# Patient Record
Sex: Female | Born: 1950
Health system: Southern US, Community
[De-identification: ages and names within clinical notes are randomized; demographics above are authoritative.]

## PROBLEM LIST (undated history)

## (undated) DIAGNOSIS — N289 Disorder of kidney and ureter, unspecified: Secondary | ICD-10-CM

## (undated) DIAGNOSIS — Z972 Presence of dental prosthetic device (complete) (partial): Secondary | ICD-10-CM

## (undated) DIAGNOSIS — Z860100 Personal history of colon polyps, unspecified: Secondary | ICD-10-CM

## (undated) DIAGNOSIS — N184 Chronic kidney disease, stage 4 (severe): Secondary | ICD-10-CM

## (undated) DIAGNOSIS — Z95828 Presence of other vascular implants and grafts: Secondary | ICD-10-CM

## (undated) DIAGNOSIS — Z923 Personal history of irradiation: Secondary | ICD-10-CM

## (undated) DIAGNOSIS — Z9221 Personal history of antineoplastic chemotherapy: Secondary | ICD-10-CM

## (undated) DIAGNOSIS — I82409 Acute embolism and thrombosis of unspecified deep veins of unspecified lower extremity: Secondary | ICD-10-CM

## (undated) DIAGNOSIS — A419 Sepsis, unspecified organism: Secondary | ICD-10-CM

## (undated) DIAGNOSIS — M7581 Other shoulder lesions, right shoulder: Secondary | ICD-10-CM

## (undated) DIAGNOSIS — D631 Anemia in chronic kidney disease: Secondary | ICD-10-CM

## (undated) DIAGNOSIS — IMO0002 Reserved for concepts with insufficient information to code with codable children: Secondary | ICD-10-CM

## (undated) DIAGNOSIS — I6521 Occlusion and stenosis of right carotid artery: Secondary | ICD-10-CM

## (undated) DIAGNOSIS — D0510 Intraductal carcinoma in situ of unspecified breast: Secondary | ICD-10-CM

## (undated) DIAGNOSIS — M810 Age-related osteoporosis without current pathological fracture: Secondary | ICD-10-CM

## (undated) DIAGNOSIS — E119 Type 2 diabetes mellitus without complications: Secondary | ICD-10-CM

## (undated) DIAGNOSIS — K435 Parastomal hernia without obstruction or  gangrene: Secondary | ICD-10-CM

## (undated) DIAGNOSIS — E1165 Type 2 diabetes mellitus with hyperglycemia: Secondary | ICD-10-CM

## (undated) DIAGNOSIS — E118 Type 2 diabetes mellitus with unspecified complications: Secondary | ICD-10-CM

## (undated) DIAGNOSIS — E785 Hyperlipidemia, unspecified: Secondary | ICD-10-CM

## (undated) DIAGNOSIS — C7910 Secondary malignant neoplasm of unspecified urinary organs: Secondary | ICD-10-CM

## (undated) DIAGNOSIS — I10 Essential (primary) hypertension: Secondary | ICD-10-CM

## (undated) DIAGNOSIS — C679 Malignant neoplasm of bladder, unspecified: Secondary | ICD-10-CM

## (undated) DIAGNOSIS — D509 Iron deficiency anemia, unspecified: Secondary | ICD-10-CM

## (undated) DIAGNOSIS — N189 Chronic kidney disease, unspecified: Secondary | ICD-10-CM

## (undated) HISTORY — DX: Occlusion and stenosis of right carotid artery: I65.21

## (undated) HISTORY — DX: Iron deficiency anemia, unspecified: D50.9

## (undated) HISTORY — DX: Type 2 diabetes mellitus with hyperglycemia: E11.65

## (undated) HISTORY — DX: Type 2 diabetes mellitus with unspecified complications: E11.8

## (undated) HISTORY — DX: Disorder of kidney and ureter, unspecified: N28.9

## (undated) HISTORY — DX: Age-related osteoporosis without current pathological fracture: M81.0

## (undated) HISTORY — DX: Personal history of colonic polyps: Z86.010

## (undated) HISTORY — DX: Type 2 diabetes mellitus without complications: E11.9

## (undated) HISTORY — DX: Essential (primary) hypertension: I10

## (undated) HISTORY — DX: Reserved for concepts with insufficient information to code with codable children: IMO0002

## (undated) HISTORY — DX: Other shoulder lesions, right shoulder: M75.81

## (undated) HISTORY — DX: Personal history of colon polyps, unspecified: Z86.0100

## (undated) HISTORY — DX: Hyperlipidemia, unspecified: E78.5

## (undated) HISTORY — DX: Chronic kidney disease, unspecified: N18.9

---
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
## 1998-06-04 HISTORY — PX: TOTAL ABDOMINAL HYSTERECTOMY: SHX209

## 1998-06-04 HISTORY — PX: ABDOMINAL HYSTERECTOMY: SHX81

## 2003-09-21 ENCOUNTER — Ambulatory Visit (HOSPITAL_COMMUNITY): Admission: RE | Admit: 2003-09-21 | Discharge: 2003-09-21 | Payer: Self-pay | Admitting: Orthopedic Surgery

## 2004-04-04 ENCOUNTER — Ambulatory Visit: Payer: Self-pay | Admitting: Surgery

## 2004-10-09 ENCOUNTER — Ambulatory Visit: Payer: Self-pay | Admitting: Surgery

## 2005-10-29 ENCOUNTER — Ambulatory Visit: Payer: Self-pay | Admitting: Family Medicine

## 2007-06-17 ENCOUNTER — Ambulatory Visit: Payer: Self-pay | Admitting: Family Medicine

## 2012-09-02 ENCOUNTER — Ambulatory Visit: Payer: Self-pay | Admitting: Physician Assistant

## 2013-02-23 ENCOUNTER — Encounter: Payer: Self-pay | Admitting: Physical Medicine & Rehabilitation

## 2013-03-17 HISTORY — PX: KNEE SURGERY: SHX244

## 2013-03-20 ENCOUNTER — Encounter: Payer: Worker's Compensation | Attending: Physical Medicine & Rehabilitation

## 2013-03-20 ENCOUNTER — Encounter: Payer: Self-pay | Admitting: Physical Medicine & Rehabilitation

## 2013-03-20 ENCOUNTER — Ambulatory Visit (HOSPITAL_BASED_OUTPATIENT_CLINIC_OR_DEPARTMENT_OTHER): Payer: Worker's Compensation | Admitting: Physical Medicine & Rehabilitation

## 2013-03-20 VITALS — BP 158/84 | HR 99 | Resp 14 | Ht 63.0 in | Wt 215.0 lb

## 2013-03-20 DIAGNOSIS — Z5181 Encounter for therapeutic drug level monitoring: Secondary | ICD-10-CM

## 2013-03-20 DIAGNOSIS — M5380 Other specified dorsopathies, site unspecified: Secondary | ICD-10-CM | POA: Insufficient documentation

## 2013-03-20 DIAGNOSIS — M545 Low back pain, unspecified: Secondary | ICD-10-CM | POA: Insufficient documentation

## 2013-03-20 DIAGNOSIS — E785 Hyperlipidemia, unspecified: Secondary | ICD-10-CM | POA: Insufficient documentation

## 2013-03-20 DIAGNOSIS — M4310 Spondylolisthesis, site unspecified: Secondary | ICD-10-CM

## 2013-03-20 DIAGNOSIS — M4316 Spondylolisthesis, lumbar region: Secondary | ICD-10-CM | POA: Insufficient documentation

## 2013-03-20 DIAGNOSIS — I10 Essential (primary) hypertension: Secondary | ICD-10-CM | POA: Insufficient documentation

## 2013-03-20 DIAGNOSIS — M47812 Spondylosis without myelopathy or radiculopathy, cervical region: Secondary | ICD-10-CM

## 2013-03-20 DIAGNOSIS — M549 Dorsalgia, unspecified: Secondary | ICD-10-CM

## 2013-03-20 DIAGNOSIS — Q762 Congenital spondylolisthesis: Secondary | ICD-10-CM | POA: Insufficient documentation

## 2013-03-20 DIAGNOSIS — M542 Cervicalgia: Secondary | ICD-10-CM | POA: Insufficient documentation

## 2013-03-20 DIAGNOSIS — Z79899 Other long term (current) drug therapy: Secondary | ICD-10-CM

## 2013-03-20 DIAGNOSIS — E119 Type 2 diabetes mellitus without complications: Secondary | ICD-10-CM | POA: Insufficient documentation

## 2013-03-20 NOTE — Progress Notes (Addendum)
Subjective:    Patient ID: Angela Russell, female    DOB: [birth year], 62 y.o.   MRN: [id number] DOI 08/27/2012  Primary complaint is neck pain with movement. 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 Golden Circle forwards on to the left knee. Injured left knee right shoulder neck and low back. Initial visit was the following day to a walk-in clinic in [place]. Then went to [facility] for another evaluation. X-rays were performed. Patient was then referred to orthopedic surgeon Dr. Hart Robinsons. Also referred to Dr. Louanne Skye orthopedic spine surgeon. I have reviewed the records from Dr. Louanne Skye. I do not have records from Dr. Theda Sers. Referral comes from orthopedic spine surgery and evaluation will be focused on cervical thoracic and lumbar spine pain.  Report from Dr. Louanne Skye indicates some numbness and tingling on the right side on his initial evaluation 11/14/2012. X-rays during that visit revealed slight anterolisthesis C3-C4 anterior spurs C4-C5 C5-C6 C6-C7 and C7-T1 narrowing of the disc spaces at C4-C5 and C5-C6. 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 Mainly from T4 to T. 11. Spondylosis noted at L4-L5 L5-S1 SI joint slipped okay. Grade 1 to grade 2 spondylolisthesis of L5-S1 levels MRI was ordered. Diagnosis of exacerbation of cervical spondylosis as well as exacerbation of spondylolisthesis MRI of the cervical spine showed central disc protrusions C3-C4 moderate stenosis 9 mm C4-C5 disc osteophyte complex 10 mm canal. C5-C6 central disc protrusion 8 mm canal. MRI of the shoulder ordered by Dr. Theda Sers showed a right rotator cuff supraspinatus tendon tear. Patient was treated with naproxen and Flexeril. EMG studies were recommended.  Goes to physical therapy on Wednesdays and Fridays. Physical therapy is addressing left knee pain, right shoulder pain, neck pain. Also working somewhat with low back pain  although this symptom is generally improving  Postoperatively after her knee arthroscopy 03/17/2013 tried some hydrocodone but could not tolerate this medicine. Also not taking any over-the-counter medications. No longer taking naproxen or Flexeril  Has not been back to work since injury. 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 Theda Sers on 03/17/2013, "torn meniscus" Pain Inventory Average Pain 7 Pain Right Now 8 My pain is dull  In the last 24 hours, has pain interfered with the following? General activity 8 Relation with others 9 Enjoyment of life 9 What TIME of day is your pain at its worst? night Sleep (in general) Fair  Pain is worse with: unsure Pain improves with: therapy/exercise and TENS Relief from Meds: 7  Mobility walk without assistance ability to climb steps?  no do you drive?  no needs help with transfers Do you have any goals in this area?  no  Function employed # of hrs/week . 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  no  Physicians involved in your care Any changes since last visit?  no   Family History  Problem Relation Age of Onset  . Heart disease Mother   . Depression Sister    History   Social History  . Marital Status: Legally Separated    Spouse Name: N/A    Number of Children: N/A  . Years of Education: N/A  Social History Main Topics  . Smoking status: Former Research scientist (life sciences)  . Smokeless tobacco: Never Used  . Alcohol Use: No  . Drug Use: No  . Sexual Activity: None   Other Topics Concern  . None   Social History Narrative  . None   Past Surgical History  Procedure Laterality Date  . Abdominal hysterectomy    . Knee surgery Left 03-17-13   Past  Medical History  Diagnosis Date  . Hyperlipidemia   . Hypertension   . Diabetes mellitus without complication    BP A999333  Pulse 99  Resp 14  Ht 5\' 3"  (1.6 m)  Wt 215 lb (97.523 kg)  BMI 38.09 kg/m2  SpO2 97%      Review of Systems  Musculoskeletal: Positive for back pain, gait problem and neck pain.  All other systems reviewed and are negative.       Objective:   Physical Exam  Nursing note and vitals reviewed. Constitutional: She is oriented to person, place, and time. She appears well-developed and well-nourished.  HENT:  Head: Normocephalic and atraumatic.  Right Ear: External ear normal.  Left Ear: External ear normal.  Eyes: Conjunctivae and EOM are normal. Pupils are equal, round, and reactive to light.  Neck: Normal range of motion.  Musculoskeletal:       Cervical back: She exhibits tenderness, bony tenderness and pain. She exhibits no swelling and no deformity.       Thoracic back: Normal.       Lumbar back: Normal.  Tenderness along the lateral masses of the right C4, C5, and C6 areas  Neurological: She is alert and oriented to person, place, and time. She has normal strength and normal reflexes. No sensory deficit. Gait abnormal. Coordination normal.  Short step gait no assist device.  Psychiatric: She has a normal mood and affect.          Assessment & Plan:  1. Cervical facet syndrome appears most symptomatic around right mid cervical area. No evidence of radiculopathy. I think she may benefit from right cervical medial branch block C4 C5-C6 under fluoroscopic guidance lidocaine only given diabetes. Patient really does not want to try any other oral medications.  Continue outpatient physical therapy.  Sedentary work restrictions no driving. Will defer to Dr. Theda Sers in terms of what he feels the patient is ready to drive a clutch. I don't think her neck problems will interfere with using side mirrors  2. Lumbar spondylolisthesis mild symptoms at  this time. Continue physical therapy. No injections recommended  Care of the patient discussed with case manager Whitman Hero RN with the patient present. Fax [phone]

## 2013-03-20 NOTE — Patient Instructions (Addendum)
Recommend sedentary light duty no driving Continue outpatient physical therapy Dr. Theda Sers from orthopedics will recommend when you may use the left knee to shift  Medial branch blocks can be done either here or at Dr. Otho Ket office

## 2013-04-23 ENCOUNTER — Ambulatory Visit (HOSPITAL_BASED_OUTPATIENT_CLINIC_OR_DEPARTMENT_OTHER): Payer: Worker's Compensation | Admitting: Physical Medicine & Rehabilitation

## 2013-04-23 ENCOUNTER — Encounter: Payer: Self-pay | Admitting: Physical Medicine & Rehabilitation

## 2013-04-23 ENCOUNTER — Encounter: Payer: Worker's Compensation | Attending: Physical Medicine & Rehabilitation

## 2013-04-23 ENCOUNTER — Telehealth: Payer: Self-pay

## 2013-04-23 VITALS — BP 170/83 | HR 110 | Resp 14 | Ht 63.5 in | Wt 219.8 lb

## 2013-04-23 DIAGNOSIS — M545 Low back pain, unspecified: Secondary | ICD-10-CM | POA: Insufficient documentation

## 2013-04-23 DIAGNOSIS — M5380 Other specified dorsopathies, site unspecified: Secondary | ICD-10-CM

## 2013-04-23 DIAGNOSIS — I10 Essential (primary) hypertension: Secondary | ICD-10-CM | POA: Insufficient documentation

## 2013-04-23 DIAGNOSIS — M542 Cervicalgia: Secondary | ICD-10-CM | POA: Insufficient documentation

## 2013-04-23 DIAGNOSIS — Q762 Congenital spondylolisthesis: Secondary | ICD-10-CM | POA: Insufficient documentation

## 2013-04-23 DIAGNOSIS — M47812 Spondylosis without myelopathy or radiculopathy, cervical region: Secondary | ICD-10-CM

## 2013-04-23 DIAGNOSIS — E119 Type 2 diabetes mellitus without complications: Secondary | ICD-10-CM | POA: Insufficient documentation

## 2013-04-23 DIAGNOSIS — E785 Hyperlipidemia, unspecified: Secondary | ICD-10-CM | POA: Insufficient documentation

## 2013-04-23 NOTE — Patient Instructions (Addendum)
Cervical medial branch blocks done. C4 C5-C6 levels   This is to help diagnose the cause of neck pain. It is important that you keep track of your pain for the first day or 2 after injection. This injection can give you temporary relief that lasts for hours or up to several months. There is no way to predict duration of pain relief.  Please try to compare your pain after injection to for the injection.  If this injection gives you  temporary relief there may be another longer-lasting procedure that may be beneficial call radiofrequency ablation

## 2013-04-23 NOTE — Telephone Encounter (Signed)
Patient called wondering when she can go back to therapy after injection.  Advise patient she can go to therapy but needs to advise therapist.

## 2013-04-23 NOTE — Progress Notes (Signed)
  [facility]   Name: Angela Russell DOB:29-Jan-1951 MRN: [id number]  Date:04/23/2013  Physician: Alysia Penna, MD    Nurse/CMA: Claiborne Rigg RN/[place] CMA  Allergies: No Known Allergies  Consent Signed: yes  Is patient diabetic? yes  CBG today?   Pregnant: no LMP: No LMP recorded. Patient has had a hysterectomy. (age [age])  Anticoagulants: no Anti-inflammatory: no Antibiotics: no  Procedure: Right Cervical 4-5-6 Medial Branch Block Position: Left Lateral Start Time:1:16  End Time: 1:25 Fluoro Time: 23 seconds  RN/CMA Walston CMA Shumaker RN    Time 12:52 1:29    BP 170/83 151/67    Pulse 110 105    Respirations 14 14    O2 Sat 96 100    S/S 6 6    Pain Level 7/10 0/10     D/C home with Kathlene November, patient A & O X 3, D/C instructions reviewed, and sits independently.

## 2013-04-23 NOTE — Progress Notes (Signed)
Right C4 C5-C6 medial branch blocks under fluoroscopic guidance  Indication right cervical facet syndrome with pain that does not respond well to conservative care and is rated as moderate to severe  Informed consent was obtained after describing risks and benefits of the procedure with the patient is include bleeding bruising and infection she elects to proceed and has given written consent patient placed in a left lateral decubitus position fluoroscopic images obtained. Areas of marked and prepped with Betadine. A 27-gauge 1/2 inch needle was used to anesthetize the skin and subcutaneous tissue with 1% lidocaine 0.5 mL at 3 areas. Then a 25-gauge 1.5 inch needle was inserted under fluoroscopic guidance targeting the midpoint of the articular pillar of C4. Needle advanced under fluoroscopic guidance to bone contact. AP images confirms proper needle location. Then 0.5 mL of 1% lidocaine was injected. This same procedure was repeated at separate needle insertion points at C5 and at C6. Patient tolerated procedure well. Post procedure instructions given. 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 No driving a less cleared by orthopedic surgeon.

## 2013-05-08 ENCOUNTER — Encounter: Payer: Worker's Compensation | Attending: Physical Medicine & Rehabilitation

## 2013-05-08 ENCOUNTER — Ambulatory Visit (HOSPITAL_BASED_OUTPATIENT_CLINIC_OR_DEPARTMENT_OTHER): Payer: Worker's Compensation | Admitting: Physical Medicine & Rehabilitation

## 2013-05-08 ENCOUNTER — Encounter: Payer: Self-pay | Admitting: Physical Medicine & Rehabilitation

## 2013-05-08 VITALS — BP 152/60 | HR 112 | Resp 14 | Ht 63.5 in | Wt 219.0 lb

## 2013-05-08 DIAGNOSIS — M545 Low back pain, unspecified: Secondary | ICD-10-CM | POA: Insufficient documentation

## 2013-05-08 DIAGNOSIS — M47812 Spondylosis without myelopathy or radiculopathy, cervical region: Secondary | ICD-10-CM

## 2013-05-08 DIAGNOSIS — I10 Essential (primary) hypertension: Secondary | ICD-10-CM | POA: Insufficient documentation

## 2013-05-08 DIAGNOSIS — E785 Hyperlipidemia, unspecified: Secondary | ICD-10-CM | POA: Insufficient documentation

## 2013-05-08 DIAGNOSIS — M5380 Other specified dorsopathies, site unspecified: Secondary | ICD-10-CM

## 2013-05-08 DIAGNOSIS — M542 Cervicalgia: Secondary | ICD-10-CM | POA: Insufficient documentation

## 2013-05-08 DIAGNOSIS — E119 Type 2 diabetes mellitus without complications: Secondary | ICD-10-CM | POA: Insufficient documentation

## 2013-05-08 NOTE — Progress Notes (Signed)
Subjective:    Patient ID: Angela Russell, female    DOB: [birth year], 62 y.o.   MRN: [id number] 08/27/2012 HPI  Primary complaint is neck pain with movement. 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 Golden Circle forwards on to the left knee. Injured left knee right shoulder neck and low back. Initial visit was the following day to a walk-in clinic in [place]. Then went to [facility] for another evaluation. X-rays were performed. Patient was then referred to orthopedic surgeon Dr. Hart Robinsons. Also referred to Dr. Louanne Skye orthopedic spine surgeon. I have reviewed the records from Dr. Louanne Skye. I do not have records from Dr. Theda Sers. Referral comes from orthopedic spine surgery and evaluation will be focused on cervical thoracic and lumbar spine pain.  Report from Dr. Louanne Skye indicates some numbness and tingling on the right side on his initial evaluation 11/14/2012. X-rays during that visit revealed slight anterolisthesis C3-C4 anterior spurs C4-C5 C5-C6 C6-C7 and C7-T1 narrowing of the disc spaces at C4-C5 and C5-C6. 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 Mainly from T4 to T. 11. Spondylosis noted at L4-L5 L5-S1 SI joint slipped okay. Grade 1 to grade 2 spondylolisthesis of L5-S1 levels MRI was ordered. Diagnosis of exacerbation of cervical spondylosis as well as exacerbation of spondylolisthesis MRI of the cervical spine showed central disc protrusions C3-C4 moderate stenosis 9 mm C4-C5 disc osteophyte complex 10 mm canal. C5-C6 central disc protrusion 8 mm canal. MRI of the shoulder ordered by Dr. Theda Sers showed a right rotator cuff supraspinatus tendon tear. Patient was treated with naproxen and Flexeril. EMG studies were recommended.  Goes to physical therapy on Wednesdays and Fridays. Physical therapy is addressing left knee pain, right shoulder pain, neck pain. Also working somewhat with low back pain  although this symptom is generally improving  Postoperatively after her knee arthroscopy 03/17/2013 tried some hydrocodone but could not tolerate this medicine. Also not taking any over-the-counter medications. No longer taking naproxen or Flexeril  Has not been back to work since injury. 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 General activity 4 Relation with others 0 Enjoyment of life 5 What TIME of day is your pain at its worst? daytime Sleep (in general) Good  Pain is worse with: bending and some activites Pain improves with: rest and medication Relief from Meds: 6  Mobility walk without assistance how many minutes can you walk? 15 ability to climb steps?  no do you drive?  yes  Function not employed: date last employed na  Neuro/Psych No problems in this area  Prior Studies Any changes since last visit?  no  Physicians involved in your care Any changes since last visit?  no   Family History  Problem Relation Age of Onset  . Heart disease Mother   . Depression Sister    History   Social History  . Marital Status: Legally Separated    Spouse Name: N/A    Number of Children: N/A  . Years of Education: N/A   Social History Main Topics  .  Smoking status: Former Research scientist (life sciences)  . Smokeless tobacco: Never Used  . Alcohol Use: No  . Drug Use: No  . Sexual Activity: None   Other Topics Concern  . None   Social History Narrative  . None   Past Surgical History  Procedure Laterality Date  . Abdominal hysterectomy    . Knee surgery Left 03-17-13   Past Medical History  Diagnosis Date  .  Hyperlipidemia   . Hypertension   . Diabetes mellitus without complication    BP A999333  Pulse 112  Resp 14  Ht 5' 3.5" (1.613 m)  Wt 219 lb (99.338 kg)  BMI 38.18 kg/m2  SpO2 98%     Review of Systems  Musculoskeletal: Positive for back pain.  All other systems reviewed and are negative.       Objective:   Physical Exam  Neck with reduced rotation toward the right side about 50% %, to left 75% Neck flexion and extension are full Upper extremity strength is normal Deep tendon reflexes bilateral biceps triceps brachioradialis are normal Reduced right shoulder internal/external rotation. R C5 lateral neck tenderness      Assessment & Plan:  1. Cervical facet syndrome, C4-5, C5-6 No signs of radiculopathy Good response to C4-5-6 injection Continue PT.  If pain increases to a 5 or greater would recommend repeat C4 C5-C6 medial branch blocks Also discussed that radio frequency ablation may be helpful if only short term pain relief is achieved through medial branch blocks and physical therapy  Return to clinic 6 weeks  From a neck standpoint I  feel patient can perform light duty however has knee and R shoulder limitations that are being treated by orthopedics

## 2013-05-08 NOTE — Patient Instructions (Signed)
Cervical facet syndrome, C4-5, C5-6 No signs of radiculopathy Good response to C4-5-6 injection Continue PT.  If pain increases to a 5 or greater would recommend repeat C4 C5-C6 medial branch blocks Also discussed that radio frequency ablation may be helpful if only short term pain relief is achieved through medial branch blocks and physical therapy  Return to clinic 6 weeks  From a neck standpoint I  feel patient can perform light duty however has knee and R shoulder limitations that are being treated by orthopedics

## 2013-06-01 ENCOUNTER — Ambulatory Visit: Payer: Worker's Compensation | Admitting: Physical Medicine & Rehabilitation

## 2013-06-19 ENCOUNTER — Encounter: Payer: Self-pay | Admitting: Physical Medicine & Rehabilitation

## 2013-06-19 ENCOUNTER — Encounter: Payer: Worker's Compensation | Attending: Physical Medicine & Rehabilitation

## 2013-06-19 ENCOUNTER — Ambulatory Visit (HOSPITAL_BASED_OUTPATIENT_CLINIC_OR_DEPARTMENT_OTHER): Payer: Worker's Compensation | Admitting: Physical Medicine & Rehabilitation

## 2013-06-19 VITALS — BP 159/80 | HR 114 | Resp 14 | Ht 63.0 in | Wt 225.0 lb

## 2013-06-19 DIAGNOSIS — M531 Cervicobrachial syndrome: Secondary | ICD-10-CM

## 2013-06-19 DIAGNOSIS — E119 Type 2 diabetes mellitus without complications: Secondary | ICD-10-CM | POA: Insufficient documentation

## 2013-06-19 DIAGNOSIS — M542 Cervicalgia: Secondary | ICD-10-CM | POA: Insufficient documentation

## 2013-06-19 DIAGNOSIS — M47812 Spondylosis without myelopathy or radiculopathy, cervical region: Secondary | ICD-10-CM

## 2013-06-19 DIAGNOSIS — M545 Low back pain, unspecified: Secondary | ICD-10-CM | POA: Insufficient documentation

## 2013-06-19 DIAGNOSIS — M5380 Other specified dorsopathies, site unspecified: Secondary | ICD-10-CM | POA: Insufficient documentation

## 2013-06-19 DIAGNOSIS — I10 Essential (primary) hypertension: Secondary | ICD-10-CM | POA: Insufficient documentation

## 2013-06-19 DIAGNOSIS — E785 Hyperlipidemia, unspecified: Secondary | ICD-10-CM | POA: Insufficient documentation

## 2013-06-19 DIAGNOSIS — Q762 Congenital spondylolisthesis: Secondary | ICD-10-CM | POA: Insufficient documentation

## 2013-06-19 NOTE — Patient Instructions (Signed)
Work restrictions per Dr. Louanne Skye  No further injections needed at this time.  Not be prescribing any pain medications.  Therapy as well as establishment of an MI per Dr. Louanne Skye

## 2013-06-19 NOTE — Progress Notes (Signed)
Subjective:    Patient ID: Angela Russell, female    DOB: [birth year], 63 y.o.   MRN: [id number]  HPI Neck is feeling better, no neck pain unless turning head all the way to the right Still doing physical therapy on neck knee as well as shoulder, still going twice a week, has been going to physical therapy since May of 2014   Still orthopedist Dr. Theda Sers for shoulder pain on the right side as well as left knee pain. Left knee pain is much better Takes occasional naproxen for pain Pain Inventory Average Pain 6 Pain Right Now 4 My pain is dull  In the last 24 hours, has pain interfered with the following? General activity 7 Relation with others 4 Enjoyment of life 7 What TIME of day is your pain at its worst? daytime Sleep (in general) Fair  Pain is worse with: walking and some activites Pain improves with: therapy/exercise and injections Relief from Meds: 4  Mobility walk without assistance how many minutes can you walk? 20 do you drive?  yes transfers alone Do you have any goals in this area?  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  yes  Neuro/Psych No problems in this area  Prior Studies Any changes since last visit?  no  Physicians involved in your care Any changes since last visit?  yes   Family History  Problem Relation Age of Onset  . Heart disease Mother   . Depression Sister    History   Social History  . Marital Status: Legally Separated    Spouse Name: N/A    Number of Children: N/A  . Years of Education: N/A   Social History Main Topics  . Smoking status: Former Research scientist (life sciences)  . Smokeless tobacco: Never Used  . Alcohol Use: No  . Drug Use: No  . Sexual Activity: None   Other Topics Concern  . None   Social History Narrative  . None   Past Surgical History  Procedure Laterality Date  . Abdominal hysterectomy    . Knee surgery Left 03-17-13    Past Medical History  Diagnosis Date  . Hyperlipidemia   . Hypertension   . Diabetes mellitus without complication    BP 99991111  Pulse 114  Resp 14  Ht 5\' 3"  (1.6 m)  Wt 225 lb (102.059 kg)  BMI 39.87 kg/m2  SpO2 97%      Review of Systems  Endocrine:       High blood sugar  Musculoskeletal: Positive for back pain and neck pain.  All other systems reviewed and are negative.       Objective:   Physical Exam  Neck range of motion has improved turning to the right now is 75% of normal turning to left is 75-90% of normal  No pain with flexion or extension of the cervical spine, no flexion or extension limitations  Upper extremity strength is normal  Ambulation is normal  Mood and affect are appropriate    Assessment & Plan:  1. right  cervical facet joints syndrome which has improved after Right C4 C5-C6 medial branch blocks under fluoroscopic guidance on 04/23/2013. No evidence of recurrence of more severe pain. At this point I do not think any further interventional procedures are necessary.  She'll followup with spine surgeon as well as orthopedic surgeon.  I will see her on a when necessary basis should  her neck pain recur to repeat the above procedure  Care of the patient discussed with case manager Whitman Hero RN with the patient present. Fax [phone]

## 2013-11-05 DIAGNOSIS — I708 Atherosclerosis of other arteries: Secondary | ICD-10-CM | POA: Insufficient documentation

## 2014-01-02 DIAGNOSIS — I6521 Occlusion and stenosis of right carotid artery: Secondary | ICD-10-CM

## 2014-01-02 HISTORY — DX: Occlusion and stenosis of right carotid artery: I65.21

## 2016-01-27 DIAGNOSIS — E1165 Type 2 diabetes mellitus with hyperglycemia: Secondary | ICD-10-CM | POA: Diagnosis not present

## 2016-03-08 DIAGNOSIS — E1165 Type 2 diabetes mellitus with hyperglycemia: Secondary | ICD-10-CM | POA: Diagnosis not present

## 2016-03-08 DIAGNOSIS — E782 Mixed hyperlipidemia: Secondary | ICD-10-CM | POA: Diagnosis not present

## 2016-03-08 DIAGNOSIS — N390 Urinary tract infection, site not specified: Secondary | ICD-10-CM | POA: Diagnosis not present

## 2016-03-08 DIAGNOSIS — I10 Essential (primary) hypertension: Secondary | ICD-10-CM | POA: Diagnosis not present

## 2016-03-08 DIAGNOSIS — N289 Disorder of kidney and ureter, unspecified: Secondary | ICD-10-CM | POA: Diagnosis not present

## 2016-03-09 DIAGNOSIS — N390 Urinary tract infection, site not specified: Secondary | ICD-10-CM | POA: Diagnosis not present

## 2016-03-11 DIAGNOSIS — E1165 Type 2 diabetes mellitus with hyperglycemia: Secondary | ICD-10-CM

## 2016-05-24 DIAGNOSIS — M542 Cervicalgia: Secondary | ICD-10-CM | POA: Diagnosis not present

## 2016-05-24 DIAGNOSIS — Z79899 Other long term (current) drug therapy: Secondary | ICD-10-CM | POA: Diagnosis not present

## 2016-05-24 DIAGNOSIS — Z7984 Long term (current) use of oral hypoglycemic drugs: Secondary | ICD-10-CM | POA: Diagnosis not present

## 2016-05-24 DIAGNOSIS — I10 Essential (primary) hypertension: Secondary | ICD-10-CM | POA: Diagnosis not present

## 2016-05-24 DIAGNOSIS — E119 Type 2 diabetes mellitus without complications: Secondary | ICD-10-CM | POA: Diagnosis not present

## 2016-05-24 DIAGNOSIS — H9201 Otalgia, right ear: Secondary | ICD-10-CM | POA: Diagnosis not present

## 2016-07-16 DIAGNOSIS — E1165 Type 2 diabetes mellitus with hyperglycemia: Secondary | ICD-10-CM | POA: Diagnosis not present

## 2016-07-16 DIAGNOSIS — I10 Essential (primary) hypertension: Secondary | ICD-10-CM | POA: Diagnosis not present

## 2016-07-16 LAB — MICROALBUMIN, URINE

## 2016-07-23 DIAGNOSIS — I10 Essential (primary) hypertension: Secondary | ICD-10-CM | POA: Diagnosis not present

## 2016-07-23 DIAGNOSIS — D649 Anemia, unspecified: Secondary | ICD-10-CM | POA: Diagnosis not present

## 2016-07-23 DIAGNOSIS — E1165 Type 2 diabetes mellitus with hyperglycemia: Secondary | ICD-10-CM | POA: Diagnosis not present

## 2016-07-23 DIAGNOSIS — E782 Mixed hyperlipidemia: Secondary | ICD-10-CM | POA: Diagnosis not present

## 2016-07-24 DIAGNOSIS — D649 Anemia, unspecified: Secondary | ICD-10-CM | POA: Insufficient documentation

## 2016-07-26 DIAGNOSIS — M47812 Spondylosis without myelopathy or radiculopathy, cervical region: Secondary | ICD-10-CM | POA: Diagnosis not present

## 2016-09-08 DIAGNOSIS — Z5181 Encounter for therapeutic drug level monitoring: Secondary | ICD-10-CM | POA: Diagnosis not present

## 2016-09-08 DIAGNOSIS — H65191 Other acute nonsuppurative otitis media, right ear: Secondary | ICD-10-CM | POA: Diagnosis not present

## 2016-09-08 DIAGNOSIS — Z79899 Other long term (current) drug therapy: Secondary | ICD-10-CM | POA: Diagnosis not present

## 2016-09-08 DIAGNOSIS — E162 Hypoglycemia, unspecified: Secondary | ICD-10-CM | POA: Diagnosis not present

## 2016-09-08 DIAGNOSIS — E11649 Type 2 diabetes mellitus with hypoglycemia without coma: Secondary | ICD-10-CM | POA: Diagnosis not present

## 2016-09-08 DIAGNOSIS — H9201 Otalgia, right ear: Secondary | ICD-10-CM | POA: Diagnosis not present

## 2016-09-08 DIAGNOSIS — E161 Other hypoglycemia: Secondary | ICD-10-CM | POA: Diagnosis not present

## 2016-09-08 DIAGNOSIS — R5383 Other fatigue: Secondary | ICD-10-CM | POA: Diagnosis not present

## 2016-09-08 DIAGNOSIS — Z7984 Long term (current) use of oral hypoglycemic drugs: Secondary | ICD-10-CM | POA: Diagnosis not present

## 2016-09-08 DIAGNOSIS — E1165 Type 2 diabetes mellitus with hyperglycemia: Secondary | ICD-10-CM | POA: Diagnosis not present

## 2016-09-09 DIAGNOSIS — E1165 Type 2 diabetes mellitus with hyperglycemia: Secondary | ICD-10-CM | POA: Diagnosis not present

## 2016-09-09 DIAGNOSIS — H9201 Otalgia, right ear: Secondary | ICD-10-CM | POA: Diagnosis not present

## 2016-09-09 DIAGNOSIS — E11649 Type 2 diabetes mellitus with hypoglycemia without coma: Secondary | ICD-10-CM | POA: Diagnosis not present

## 2016-10-01 DIAGNOSIS — H5203 Hypermetropia, bilateral: Secondary | ICD-10-CM | POA: Diagnosis not present

## 2016-10-01 DIAGNOSIS — H52223 Regular astigmatism, bilateral: Secondary | ICD-10-CM | POA: Diagnosis not present

## 2016-10-01 DIAGNOSIS — H2513 Age-related nuclear cataract, bilateral: Secondary | ICD-10-CM | POA: Diagnosis not present

## 2016-10-01 DIAGNOSIS — I10 Essential (primary) hypertension: Secondary | ICD-10-CM | POA: Diagnosis not present

## 2016-10-01 DIAGNOSIS — H524 Presbyopia: Secondary | ICD-10-CM | POA: Diagnosis not present

## 2016-10-01 DIAGNOSIS — E119 Type 2 diabetes mellitus without complications: Secondary | ICD-10-CM | POA: Diagnosis not present

## 2016-10-01 LAB — HM DIABETES EYE EXAM

## 2016-10-15 DIAGNOSIS — D649 Anemia, unspecified: Secondary | ICD-10-CM | POA: Diagnosis not present

## 2016-10-15 DIAGNOSIS — E782 Mixed hyperlipidemia: Secondary | ICD-10-CM | POA: Diagnosis not present

## 2016-10-15 DIAGNOSIS — E1165 Type 2 diabetes mellitus with hyperglycemia: Secondary | ICD-10-CM | POA: Diagnosis not present

## 2016-10-15 LAB — HEMOGLOBIN A1C

## 2016-10-16 DIAGNOSIS — I10 Essential (primary) hypertension: Secondary | ICD-10-CM | POA: Insufficient documentation

## 2016-10-16 DIAGNOSIS — E1159 Type 2 diabetes mellitus with other circulatory complications: Secondary | ICD-10-CM | POA: Insufficient documentation

## 2016-10-16 DIAGNOSIS — N189 Chronic kidney disease, unspecified: Secondary | ICD-10-CM | POA: Insufficient documentation

## 2016-10-16 DIAGNOSIS — E118 Type 2 diabetes mellitus with unspecified complications: Secondary | ICD-10-CM | POA: Insufficient documentation

## 2016-10-16 DIAGNOSIS — E782 Mixed hyperlipidemia: Secondary | ICD-10-CM

## 2016-10-16 DIAGNOSIS — I152 Hypertension secondary to endocrine disorders: Secondary | ICD-10-CM | POA: Insufficient documentation

## 2016-10-16 DIAGNOSIS — E1165 Type 2 diabetes mellitus with hyperglycemia: Secondary | ICD-10-CM

## 2016-10-16 DIAGNOSIS — E785 Hyperlipidemia, unspecified: Secondary | ICD-10-CM | POA: Insufficient documentation

## 2016-10-16 DIAGNOSIS — D509 Iron deficiency anemia, unspecified: Secondary | ICD-10-CM | POA: Insufficient documentation

## 2016-10-16 DIAGNOSIS — I6521 Occlusion and stenosis of right carotid artery: Secondary | ICD-10-CM

## 2016-10-16 DIAGNOSIS — N289 Disorder of kidney and ureter, unspecified: Secondary | ICD-10-CM

## 2016-10-22 DIAGNOSIS — I10 Essential (primary) hypertension: Secondary | ICD-10-CM | POA: Diagnosis not present

## 2016-10-22 DIAGNOSIS — E782 Mixed hyperlipidemia: Secondary | ICD-10-CM | POA: Diagnosis not present

## 2016-10-22 DIAGNOSIS — E1165 Type 2 diabetes mellitus with hyperglycemia: Secondary | ICD-10-CM | POA: Diagnosis not present

## 2016-11-13 ENCOUNTER — Ambulatory Visit: Payer: Self-pay | Admitting: Unknown Physician Specialty

## 2017-01-11 ENCOUNTER — Encounter: Payer: Self-pay | Admitting: Unknown Physician Specialty

## 2017-01-11 ENCOUNTER — Ambulatory Visit (INDEPENDENT_AMBULATORY_CARE_PROVIDER_SITE_OTHER): Payer: Medicare Other | Admitting: Unknown Physician Specialty

## 2017-01-11 VITALS — BP 128/71 | HR 103 | Temp 98.2°F | Ht 63.1 in | Wt 211.7 lb

## 2017-01-11 DIAGNOSIS — E1165 Type 2 diabetes mellitus with hyperglycemia: Secondary | ICD-10-CM | POA: Diagnosis not present

## 2017-01-11 DIAGNOSIS — D509 Iron deficiency anemia, unspecified: Secondary | ICD-10-CM

## 2017-01-11 DIAGNOSIS — I6521 Occlusion and stenosis of right carotid artery: Secondary | ICD-10-CM | POA: Diagnosis not present

## 2017-01-11 DIAGNOSIS — Z7689 Persons encountering health services in other specified circumstances: Secondary | ICD-10-CM | POA: Diagnosis not present

## 2017-01-11 DIAGNOSIS — I10 Essential (primary) hypertension: Secondary | ICD-10-CM | POA: Diagnosis not present

## 2017-01-11 DIAGNOSIS — IMO0001 Reserved for inherently not codable concepts without codable children: Secondary | ICD-10-CM

## 2017-01-11 DIAGNOSIS — E782 Mixed hyperlipidemia: Secondary | ICD-10-CM

## 2017-01-11 LAB — BAYER DCA HB A1C WAIVED: HB A1C (BAYER DCA - WAIVED): 7.1 % — ABNORMAL HIGH (ref <7.0)

## 2017-01-11 NOTE — Progress Notes (Signed)
BP 128/71   Pulse (!) 103   Temp 98.2 F (36.8 C)   Ht 5' 3.1" (1.603 m)   Wt 211 lb 11.2 oz (96 kg)   SpO2 97%   BMI 37.38 kg/m    Subjective:    Patient ID: Angela Russell, female    DOB: [birth year], 66 y.o.   MRN: [id number]  HPI: Angela Russell is a 66 y.o. female  Chief Complaint  Patient presents with  . Establish Care  . Neck Pain    pt states her neck has been bothering her for about 7 to 8 months   . Diabetes    pt states she has had an eye exam within the last year, will fax form to Dr. Matilde Sprang   Diabetes: Using medications without difficulties No hypoglycemic episodes No hyperglycemic episodes Feet problems: none Blood Sugars averaging:Meter is broken eye exam within last year Last Hgb A1C: 7.2  Hypertension  Using medications without difficulty Average home BPs Needs battery   Using medication without problems or lightheadedness No chest pain with exertion or shortness of breath No Edema  Elevated Cholesterol Using medications without problems No Muscle aches  Diet: Eats well.   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 Marital status: Legally Separated    Spouse name: N/A  . Number of children: N/A  . Years of education: N/A   Occupational History  . Not on file.   Social History Main Topics  . Smoking status: Former Smoker    Quit date: 06/05/1991  . Smokeless tobacco: Never Used  . Alcohol use No  . Drug use: No  . Sexual activity: No   Other Topics Concern  . Not on file   Social History Narrative  . No narrative on file   Family History  Problem Relation Age of Onset  .  Heart disease Mother   . Arthritis Father   . Diabetes Brother    Past Medical History:  Diagnosis Date  . Carotid artery plaque, right 01/2014  . CKD (chronic kidney disease)    stage 2-3  . Diabetes mellitus without complication ([place])   . DM (diabetes mellitus), type 2, uncontrolled ([place])   . Hyperlipidemia   . Hypertension   . Hypochromic microcytic anemia    mild  . Osteoporosis   . Renal insufficiency   . Rotator cuff tendonitis, right    Past Surgical History:  Procedure Laterality Date  . ABDOMINAL HYSTERECTOMY  2000   due to bleeding and fibroids, partial- still has ovaries  . KNEE SURGERY Left 03/17/2013   torn meniscus     Relevant past medical, surgical, family and social history reviewed and updated as indicated. Interim medical history since our last visit reviewed. Allergies and medications reviewed and updated.  Review of Systems  Constitutional: Negative.   HENT: Negative.   Eyes: Negative.   Respiratory: Negative.   Cardiovascular: Negative.   Gastrointestinal: Negative.   Endocrine: Negative.   Genitourinary: Negative.   Musculoskeletal:       Right shoulder surgery pending for rotator cuff  repair.    Skin: Negative.   Allergic/Immunologic: Negative.   Neurological: Negative.   Hematological: Negative.   Psychiatric/Behavioral: Negative.     Per HPI unless specifically indicated above     Objective:    BP 128/71   Pulse (!) 103   Temp 98.2 F (36.8 C)   Ht 5' 3.1" (1.603 m)   Wt 211 lb 11.2 oz (96 kg)   SpO2 97%   BMI 37.38 kg/m   Wt Readings from Last 3 Encounters:  01/11/17 211 lb 11.2 oz (96 kg)  06/19/13 225 lb (102.1 kg)  05/08/13 219 lb (99.3 kg)    Physical Exam  Constitutional: She is oriented to person, place, and time. She appears well-developed and well-nourished. No distress.  HENT:  Head: Normocephalic and atraumatic.  Eyes: Conjunctivae and lids are normal. Right eye exhibits no discharge. Left eye exhibits no  discharge. No scleral icterus.  Neck: Normal range of motion. Neck supple. No JVD present. Carotid bruit is not present.  Cardiovascular: Normal rate, regular rhythm and normal heart sounds.   Pulmonary/Chest: Effort normal and breath sounds normal.  Abdominal: Normal appearance. There is no splenomegaly or hepatomegaly.  Musculoskeletal: Normal range of motion.  Neurological: She is alert and oriented to person, place, and time.  Skin: Skin is warm, dry and intact. No rash noted. No pallor.  Psychiatric: She has a normal mood and affect. Her behavior is normal. Judgment and thought content normal.    Results for orders placed or performed in visit on 10/16/16  Microalbumin, urine  Result Value Ref Range   Microalb, Ur per care everywhere   Hemoglobin A1c  Result Value Ref Range   Hemoglobin A1C per care everywhere       Assessment & Plan:   Problem List Items Addressed This Visit      Unprioritized   DM (diabetes mellitus), type 2, uncontrolled ([place]) - Primary    Hgb A1C not to goal 7.1 but decreased from 7.3.  Wants to work on diet and exercise rather than increase medication      Relevant Medications   lisinopril-hydrochlorothiazide (PRINZIDE,ZESTORETIC) 20-12.5 MG tablet   metFORMIN (GLUCOPHAGE) 1000 MG tablet   pioglitazone (ACTOS) 15 MG tablet   pravastatin (PRAVACHOL) 10 MG tablet   Other Relevant Orders   Bayer DCA Hb A1c Waived   Comprehensive metabolic panel   Encounter to establish care   Hyperlipidemia    On very low dose of Pravastatin.  Seems to work well for her with LDL of 77 with review of notes      Relevant Medications   amLODipine (NORVASC) 2.5 MG tablet   lisinopril-hydrochlorothiazide (PRINZIDE,ZESTORETIC) 20-12.5 MG tablet   pravastatin (PRAVACHOL) 10 MG tablet   Hypertension    Stable, continue present medications.        Relevant Medications   amLODipine (NORVASC) 2.5 MG tablet   lisinopril-hydrochlorothiazide (PRINZIDE,ZESTORETIC) 20-12.5  MG tablet   pravastatin (PRAVACHOL) 10 MG tablet   Other Relevant Orders   Comprehensive metabolic panel   Hypochromic microcytic anemia    Check CBC with history      Relevant Orders   CBC with Differential/Platelet       Follow up plan: Return in about 3 months (around 04/13/2017).

## 2017-01-11 NOTE — Assessment & Plan Note (Signed)
Hgb A1C not to goal 7.1 but decreased from 7.3.  Wants to work on diet and exercise rather than increase medication

## 2017-01-11 NOTE — Assessment & Plan Note (Signed)
Check CBC with history

## 2017-01-11 NOTE — Addendum Note (Signed)
Addended by: Georgina Peer on: 01/11/2017 04:18 PM   Modules accepted: Orders

## 2017-01-11 NOTE — Assessment & Plan Note (Signed)
Stable, continue present medications.   

## 2017-01-11 NOTE — Assessment & Plan Note (Signed)
On very low dose of Pravastatin.  Seems to work well for her with LDL of 77 with review of notes

## 2017-01-12 LAB — CBC WITH DIFFERENTIAL/PLATELET
Basophils Absolute: 0 10*3/uL (ref 0.0–0.2)
Basos: 0 %
EOS (ABSOLUTE): 0.1 10*3/uL (ref 0.0–0.4)
Eos: 1 %
Hematocrit: 34.4 % (ref 34.0–46.6)
Hemoglobin: 10.7 g/dL — ABNORMAL LOW (ref 11.1–15.9)
Immature Grans (Abs): 0 10*3/uL (ref 0.0–0.1)
Immature Granulocytes: 0 %
Lymphocytes Absolute: 1.9 10*3/uL (ref 0.7–3.1)
Lymphs: 34 %
MCH: 23.9 pg — ABNORMAL LOW (ref 26.6–33.0)
MCHC: 31.1 g/dL — ABNORMAL LOW (ref 31.5–35.7)
MCV: 77 fL — ABNORMAL LOW (ref 79–97)
Monocytes Absolute: 0.4 10*3/uL (ref 0.1–0.9)
Monocytes: 7 %
Neutrophils Absolute: 3.2 10*3/uL (ref 1.4–7.0)
Neutrophils: 58 %
Platelets: 292 10*3/uL (ref 150–379)
RBC: 4.48 x10E6/uL (ref 3.77–5.28)
RDW: 14.8 % (ref 12.3–15.4)
WBC: 5.6 10*3/uL (ref 3.4–10.8)

## 2017-01-12 LAB — COMPREHENSIVE METABOLIC PANEL
ALT: 13 IU/L (ref 0–32)
AST: 21 IU/L (ref 0–40)
Albumin/Globulin Ratio: 1.5 (ref 1.2–2.2)
Albumin: 4.3 g/dL (ref 3.6–4.8)
Alkaline Phosphatase: 96 IU/L (ref 39–117)
BUN/Creatinine Ratio: 23 (ref 12–28)
BUN: 25 mg/dL (ref 8–27)
Bilirubin Total: 0.2 mg/dL (ref 0.0–1.2)
CO2: 25 mmol/L (ref 20–29)
Calcium: 9.9 mg/dL (ref 8.7–10.3)
Chloride: 105 mmol/L (ref 96–106)
Creatinine, Ser: 1.1 mg/dL — ABNORMAL HIGH (ref 0.57–1.00)
GFR calc Af Amer: 60 mL/min/{1.73_m2} (ref >59)
GFR calc non Af Amer: 52 mL/min/{1.73_m2} — ABNORMAL LOW (ref >59)
Globulin, Total: 2.9 g/dL (ref 1.5–4.5)
Glucose: 85 mg/dL (ref 65–99)
Potassium: 4.5 mmol/L (ref 3.5–5.2)
Sodium: 144 mmol/L (ref 134–144)
Total Protein: 7.2 g/dL (ref 6.0–8.5)

## 2017-01-14 ENCOUNTER — Encounter: Payer: Self-pay | Admitting: Unknown Physician Specialty

## 2017-01-14 ENCOUNTER — Ambulatory Visit (INDEPENDENT_AMBULATORY_CARE_PROVIDER_SITE_OTHER): Payer: Self-pay | Admitting: Unknown Physician Specialty

## 2017-01-14 VITALS — BP 143/73 | HR 105 | Temp 97.7°F | Ht 63.0 in | Wt 213.8 lb

## 2017-01-14 DIAGNOSIS — Z024 Encounter for examination for driving license: Secondary | ICD-10-CM

## 2017-01-14 NOTE — Progress Notes (Signed)
BP (!) 143/73   Pulse (!) 105   Temp 97.7 F (36.5 C)   Ht 5\' 3"  (1.6 m)   Wt 213 lb 12.8 oz (97 kg)   SpO2 98%   BMI 37.87 kg/m    Subjective:    Patient ID: Angela Russell, female    DOB: [birth year], 66 y.o.   MRN: [id number]  HPI: Angela Russell is a 66 y.o. female  Chief Complaint  Patient presents with  . DOT Physical   (see form)  DM and HTN under good control  Relevant past medical, surgical, family and social history reviewed and updated as indicated. Interim medical history since our last visit reviewed. Allergies and medications reviewed and updated.  Review of Systems  Per HPI unless specifically indicated above     Objective:    BP (!) 143/73   Pulse (!) 105   Temp 97.7 F (36.5 C)   Ht 5\' 3"  (1.6 m)   Wt 213 lb 12.8 oz (97 kg)   SpO2 98%   BMI 37.87 kg/m   Wt Readings from Last 3 Encounters:  01/14/17 213 lb 12.8 oz (97 kg)  01/11/17 211 lb 11.2 oz (96 kg)  06/19/13 225 lb (102.1 kg)    Physical Exam  Constitutional: She is oriented to person, place, and time. She appears well-developed and well-nourished. No distress.  HENT:  Head: Normocephalic and atraumatic.  Eyes: Conjunctivae and lids are normal. Right eye exhibits no discharge. Left eye exhibits no discharge. No scleral icterus.  Neck: Normal range of motion. Neck supple. No JVD present. Carotid bruit is not present.  Cardiovascular: Normal rate, regular rhythm and normal heart sounds.   Pulmonary/Chest: Effort normal and breath sounds normal.  Abdominal: Normal appearance. There is no splenomegaly or hepatomegaly.  Musculoskeletal: Normal range of motion.  Neurological: She is alert and oriented to person, place, and time.  Skin: Skin is warm, dry and intact. No rash noted. No pallor.  Psychiatric: She has a normal mood and affect. Her behavior is normal. Judgment and thought content normal.   (see form)  Results for orders placed or performed in visit on 01/11/17  Bayer DCA  Hb A1c Waived  Result Value Ref Range   Bayer DCA Hb A1c Waived 7.1 (H) <7.0 %  CBC with Differential/Platelet  Result Value Ref Range   WBC 5.6 3.4 - 10.8 x10E3/uL   RBC 4.48 3.77 - 5.28 x10E6/uL   Hemoglobin 10.7 (L) 11.1 - 15.9 g/dL   Hematocrit 34.4 34.0 - 46.6 %   MCV 77 (L) 79 - 97 fL   MCH 23.9 (L) 26.6 - 33.0 pg   MCHC 31.1 (L) 31.5 - 35.7 g/dL   RDW 14.8 12.3 - 15.4 %   Platelets 292 150 - 379 x10E3/uL   Neutrophils 58 Not Estab. %   Lymphs 34 Not Estab. %   Monocytes 7 Not Estab. %   Eos 1 Not Estab. %   Basos 0 Not Estab. %   Neutrophils Absolute 3.2 1.4 - 7.0 x10E3/uL   Lymphocytes Absolute 1.9 0.7 - 3.1 x10E3/uL   Monocytes Absolute 0.4 0.1 - 0.9 x10E3/uL   EOS (ABSOLUTE) 0.1 0.0 - 0.4 x10E3/uL   Basophils Absolute 0.0 0.0 - 0.2 x10E3/uL   Immature Granulocytes 0 Not Estab. %   Immature Grans (Abs) 0.0 0.0 - 0.1 x10E3/uL  Comprehensive metabolic panel  Result Value Ref Range   Glucose 85 65 - 99 mg/dL   BUN 25 8 -  27 mg/dL   Creatinine, Ser 1.10 (H) 0.57 - 1.00 mg/dL   GFR calc non Af Amer 52 (L) >59 mL/min/1.73   GFR calc Af Amer 60 >59 mL/min/1.73   BUN/Creatinine Ratio 23 12 - 28   Sodium 144 134 - 144 mmol/L   Potassium 4.5 3.5 - 5.2 mmol/L   Chloride 105 96 - 106 mmol/L   CO2 25 20 - 29 mmol/L   Calcium 9.9 8.7 - 10.3 mg/dL   Total Protein 7.2 6.0 - 8.5 g/dL   Albumin 4.3 3.6 - 4.8 g/dL   Globulin, Total 2.9 1.5 - 4.5 g/dL   Albumin/Globulin Ratio 1.5 1.2 - 2.2   Bilirubin Total 0.2 0.0 - 1.2 mg/dL   Alkaline Phosphatase 96 39 - 117 IU/L   AST 21 0 - 40 IU/L   ALT 13 0 - 32 IU/L      Assessment & Plan:   Problem List Items Addressed This Visit    None    Visit Diagnoses    Encounter for commercial driver medical examination (CDME)    -  Primary       Follow up plan: No Follow-up on file.

## 2017-04-15 ENCOUNTER — Telehealth: Payer: Self-pay

## 2017-04-15 NOTE — Telephone Encounter (Signed)
Copied from [place] [phone] [id number]. Topic: Medicare AWV >> Apr 15, 2017  2:27 PM [place], [place] A, LPN wrote: Called to schedule medicare wellness visit with patient, can schedule anytime after 04/15/2017

## 2017-04-19 ENCOUNTER — Ambulatory Visit: Payer: Medicare Other | Admitting: Unknown Physician Specialty

## 2017-05-31 ENCOUNTER — Ambulatory Visit (INDEPENDENT_AMBULATORY_CARE_PROVIDER_SITE_OTHER): Payer: Medicare Other | Admitting: Unknown Physician Specialty

## 2017-05-31 ENCOUNTER — Encounter: Payer: Self-pay | Admitting: Unknown Physician Specialty

## 2017-05-31 VITALS — BP 166/85 | HR 92 | Temp 98.3°F | Wt 216.6 lb

## 2017-05-31 DIAGNOSIS — N289 Disorder of kidney and ureter, unspecified: Secondary | ICD-10-CM | POA: Diagnosis not present

## 2017-05-31 DIAGNOSIS — N183 Chronic kidney disease, stage 3 unspecified: Secondary | ICD-10-CM

## 2017-05-31 DIAGNOSIS — I6521 Occlusion and stenosis of right carotid artery: Secondary | ICD-10-CM

## 2017-05-31 DIAGNOSIS — E782 Mixed hyperlipidemia: Secondary | ICD-10-CM | POA: Diagnosis not present

## 2017-05-31 DIAGNOSIS — E1165 Type 2 diabetes mellitus with hyperglycemia: Secondary | ICD-10-CM

## 2017-05-31 DIAGNOSIS — Z23 Encounter for immunization: Secondary | ICD-10-CM | POA: Diagnosis not present

## 2017-05-31 DIAGNOSIS — I10 Essential (primary) hypertension: Secondary | ICD-10-CM | POA: Diagnosis not present

## 2017-05-31 MED ORDER — BLOOD GLUCOSE METER KIT: PACK | 0 refills | Status: DC

## 2017-05-31 NOTE — Assessment & Plan Note (Signed)
Stage 3.  Check GFR today

## 2017-05-31 NOTE — Assessment & Plan Note (Signed)
Elevated today.  Resistant to changing medications.  Recheck in 1 month

## 2017-05-31 NOTE — Progress Notes (Signed)
BP (!) 166/85   Pulse 92   Temp 98.3 F (36.8 C) (Oral)   Wt 216 lb 9.6 oz (98.2 kg)   SpO2 96%   BMI 38.37 kg/m    Subjective:    Patient ID: Angela Russell, female    DOB: [birth year], 66 y.o.   MRN: [id number]  HPI: Angela Russell is a 66 y.o. female  Chief Complaint  Patient presents with  . Diabetes  . Hypertension  . Hyperlipidemia   Diabetes: Using medications without difficulties No hypoglycemic episodes No hyperglycemic episodes Feet problems: none Blood Sugars averaging:Needs a glucometer eye exam within last year Last Hgb A1C: 7.2  Hypertension  Using medications without difficulty Average home BPs Not checking   Using medication without problems or lightheadedness No chest pain with exertion or shortness of breath No Edema  Elevated Cholesterol Using medications without problems No Muscle aches  Diet: Exercise: Not at goal  Relevant past medical, surgical, family and social history reviewed and updated as indicated. Interim medical history since our last visit reviewed. Allergies and medications reviewed and updated.  Review of Systems  Per HPI unless specifically indicated above     Objective:    BP (!) 166/85   Pulse 92   Temp 98.3 F (36.8 C) (Oral)   Wt 216 lb 9.6 oz (98.2 kg)   SpO2 96%   BMI 38.37 kg/m   Wt Readings from Last 3 Encounters:  05/31/17 216 lb 9.6 oz (98.2 kg)  01/14/17 213 lb 12.8 oz (97 kg)  01/11/17 211 lb 11.2 oz (96 kg)    Physical Exam  Constitutional: She is oriented to person, place, and time. She appears well-developed and well-nourished.  HENT:  Head: Normocephalic and atraumatic.  Eyes: Pupils are equal, round, and reactive to light. Right eye exhibits no discharge. Left eye exhibits no discharge. No scleral icterus.  Neck: Normal range of motion. Neck supple. Carotid bruit is not present. No thyromegaly present.  Cardiovascular: Normal rate, regular rhythm and normal heart sounds. Exam reveals no  gallop and no friction rub.  No murmur heard. Pulmonary/Chest: Effort normal and breath sounds normal. No respiratory distress. She has no wheezes. She has no rales.  Abdominal: Soft. Bowel sounds are normal. There is no tenderness. There is no rebound.  Genitourinary: No breast swelling, tenderness or discharge.  Musculoskeletal: Normal range of motion.  Lymphadenopathy:    She has no cervical adenopathy.  Neurological: She is alert and oriented to person, place, and time.  Skin: Skin is warm, dry and intact. No rash noted.  Psychiatric: She has a normal mood and affect. Her speech is normal and behavior is normal. Judgment and thought content normal. Cognition and memory are normal.    Results for orders placed or performed in visit on 01/18/17  HM DIABETES EYE EXAM  Result Value Ref Range   HM Diabetic Eye Exam No Retinopathy No Retinopathy      Assessment & Plan:   Problem List Items Addressed This Visit      Unprioritized   CKD (chronic kidney disease)    Stage 3.  Check GFR today      DM (diabetes mellitus), type 2, uncontrolled ([facility])    Hgb A1C is 7.1.  Wants to continue diet and exercise changes      Relevant Orders   Comprehensive metabolic panel   Bayer DCA Hb A1c Waived   Hyperlipidemia   Relevant Orders   Lipid Panel w/o Chol/HDL Ratio  Hypertension    Elevated today.  Resistant to changing medications.  Recheck in 1 month      Renal insufficiency   Relevant Orders   Comprehensive metabolic panel    Other Visit Diagnoses    Need for influenza vaccination    -  Primary   Relevant Orders   Flu vaccine HIGH DOSE PF (Completed)       Follow up plan: Return in about 4 weeks (around 06/28/2017).

## 2017-05-31 NOTE — Assessment & Plan Note (Signed)
Hgb A1C is 7.1.  Wants to continue diet and exercise changes

## 2017-05-31 NOTE — Patient Instructions (Addendum)

## 2017-06-01 LAB — COMPREHENSIVE METABOLIC PANEL
ALT: 14 IU/L (ref 0–32)
AST: 17 IU/L (ref 0–40)
Albumin/Globulin Ratio: 1.6 (ref 1.2–2.2)
Albumin: 4.1 g/dL (ref 3.6–4.8)
Alkaline Phosphatase: 91 IU/L (ref 39–117)
BUN/Creatinine Ratio: 22 (ref 12–28)
BUN: 18 mg/dL (ref 8–27)
Bilirubin Total: 0.2 mg/dL (ref 0.0–1.2)
CO2: 25 mmol/L (ref 20–29)
Calcium: 9.5 mg/dL (ref 8.7–10.3)
Chloride: 105 mmol/L (ref 96–106)
Creatinine, Ser: 0.81 mg/dL (ref 0.57–1.00)
GFR calc Af Amer: 87 mL/min/{1.73_m2} (ref >59)
GFR calc non Af Amer: 75 mL/min/{1.73_m2} (ref >59)
Globulin, Total: 2.6 g/dL (ref 1.5–4.5)
Glucose: 91 mg/dL (ref 65–99)
Potassium: 4.2 mmol/L (ref 3.5–5.2)
Sodium: 147 mmol/L — ABNORMAL HIGH (ref 134–144)
Total Protein: 6.7 g/dL (ref 6.0–8.5)

## 2017-06-01 LAB — LIPID PANEL W/O CHOL/HDL RATIO
Cholesterol, Total: 162 mg/dL (ref 100–199)
HDL: 66 mg/dL (ref >39)
LDL Calculated: 84 mg/dL (ref 0–99)
Triglycerides: 62 mg/dL (ref 0–149)
VLDL Cholesterol Cal: 12 mg/dL (ref 5–40)

## 2017-06-03 ENCOUNTER — Encounter: Payer: Self-pay | Admitting: Unknown Physician Specialty

## 2017-06-03 LAB — BAYER DCA HB A1C WAIVED: HB A1C (BAYER DCA - WAIVED): 7.1 % — ABNORMAL HIGH (ref <7.0)

## 2017-07-05 ENCOUNTER — Ambulatory Visit (INDEPENDENT_AMBULATORY_CARE_PROVIDER_SITE_OTHER): Payer: Medicare Other | Admitting: Unknown Physician Specialty

## 2017-07-05 ENCOUNTER — Encounter: Payer: Self-pay | Admitting: Unknown Physician Specialty

## 2017-07-05 DIAGNOSIS — I10 Essential (primary) hypertension: Secondary | ICD-10-CM | POA: Diagnosis not present

## 2017-07-05 NOTE — Progress Notes (Signed)
BP 139/78 (BP Location: Right Arm, Cuff Size: Normal)   Pulse 94   Temp 98.1 F (36.7 C) (Oral)   Wt 215 lb 3.2 oz (97.6 kg)   SpO2 98%   BMI 38.12 kg/m    Subjective:    Patient ID: Angela Russell, female    DOB: 1951-04-11, 67 y.o.   MRN: [id number]  HPI: Angela Russell is a 67 y.o. female  Chief Complaint  Patient presents with  . Hypertension    4 week f/up   Hypertension Using medications without difficulty Average home BPsstates she needs a battery for her BP monitor   No problems or lightheadedness No chest pain with exertion or shortness of breath No Edema   Relevant past medical, surgical, family and social history reviewed and updated as indicated. Interim medical history since our last visit reviewed. Allergies and medications reviewed and updated.  Review of Systems  Constitutional: Negative.   HENT: Negative.   Eyes: Negative.   Respiratory: Negative.   Cardiovascular: Negative.   Gastrointestinal: Negative.   Endocrine: Negative.   Genitourinary: Negative.   Musculoskeletal: Negative.   Skin: Negative.   Allergic/Immunologic: Negative.   Neurological: Negative.   Hematological: Negative.   Psychiatric/Behavioral: Negative.     Per HPI unless specifically indicated above     Objective:    BP 139/78 (BP Location: Right Arm, Cuff Size: Normal)   Pulse 94   Temp 98.1 F (36.7 C) (Oral)   Wt 215 lb 3.2 oz (97.6 kg)   SpO2 98%   BMI 38.12 kg/m   Wt Readings from Last 3 Encounters:  07/05/17 215 lb 3.2 oz (97.6 kg)  05/31/17 216 lb 9.6 oz (98.2 kg)  01/14/17 213 lb 12.8 oz (97 kg)    Physical Exam  Constitutional: She is oriented to person, place, and time. She appears well-developed and well-nourished. No distress.  HENT:  Head: Normocephalic and atraumatic.  Eyes: Conjunctivae and lids are normal. Right eye exhibits no discharge. Left eye exhibits no discharge. No scleral icterus.  Neck: Normal range of motion. Neck supple. No JVD  present. Carotid bruit is not present.  Cardiovascular: Normal rate, regular rhythm and normal heart sounds.  Pulmonary/Chest: Effort normal and breath sounds normal.  Abdominal: Normal appearance. There is no splenomegaly or hepatomegaly.  Musculoskeletal: Normal range of motion.  Neurological: She is alert and oriented to person, place, and time.  Skin: Skin is warm, dry and intact. No rash noted. No pallor.  Psychiatric: She has a normal mood and affect. Her behavior is normal. Judgment and thought content normal.    Results for orders placed or performed in visit on 05/31/17  Comprehensive metabolic panel  Result Value Ref Range   Glucose 91 65 - 99 mg/dL   BUN 18 8 - 27 mg/dL   Creatinine, Ser 0.81 0.57 - 1.00 mg/dL   GFR calc non Af Amer 75 >59 mL/min/1.73   GFR calc Af Amer 87 >59 mL/min/1.73   BUN/Creatinine Ratio 22 12 - 28   Sodium 147 (H) 134 - 144 mmol/L   Potassium 4.2 3.5 - 5.2 mmol/L   Chloride 105 96 - 106 mmol/L   CO2 25 20 - 29 mmol/L   Calcium 9.5 8.7 - 10.3 mg/dL   Total Protein 6.7 6.0 - 8.5 g/dL   Albumin 4.1 3.6 - 4.8 g/dL   Globulin, Total 2.6 1.5 - 4.5 g/dL   Albumin/Globulin Ratio 1.6 1.2 - 2.2   Bilirubin Total 0.2 0.0 -  1.2 mg/dL   Alkaline Phosphatase 91 39 - 117 IU/L   AST 17 0 - 40 IU/L   ALT 14 0 - 32 IU/L  Lipid Panel w/o Chol/HDL Ratio  Result Value Ref Range   Cholesterol, Total 162 100 - 199 mg/dL   Triglycerides 62 0 - 149 mg/dL   HDL 66 >39 mg/dL   VLDL Cholesterol Cal 12 5 - 40 mg/dL   LDL Calculated 84 0 - 99 mg/dL  Bayer DCA Hb A1c Waived  Result Value Ref Range   Bayer DCA Hb A1c Waived 7.1 (H) <7.0 %      Assessment & Plan:   Problem List Items Addressed This Visit      Unprioritized   Hypertension    Stable, continue present medications.            Follow up plan: Return in about 2 months (around 09/02/2017).

## 2017-07-05 NOTE — Assessment & Plan Note (Signed)
Stable, continue present medications.   

## 2017-09-06 ENCOUNTER — Encounter: Payer: Self-pay | Admitting: Unknown Physician Specialty

## 2017-09-06 ENCOUNTER — Ambulatory Visit (INDEPENDENT_AMBULATORY_CARE_PROVIDER_SITE_OTHER): Payer: Medicare Other

## 2017-09-06 ENCOUNTER — Ambulatory Visit (INDEPENDENT_AMBULATORY_CARE_PROVIDER_SITE_OTHER): Payer: Medicare Other | Admitting: Unknown Physician Specialty

## 2017-09-06 VITALS — BP 141/78 | HR 89 | Temp 97.9°F | Ht 61.0 in | Wt 218.0 lb

## 2017-09-06 VITALS — BP 150/86 | HR 88 | Temp 97.9°F | Resp 16 | Ht 61.0 in | Wt 218.0 lb

## 2017-09-06 DIAGNOSIS — Z0000 Encounter for general adult medical examination without abnormal findings: Secondary | ICD-10-CM | POA: Diagnosis not present

## 2017-09-06 DIAGNOSIS — I10 Essential (primary) hypertension: Secondary | ICD-10-CM | POA: Diagnosis not present

## 2017-09-06 DIAGNOSIS — Z1159 Encounter for screening for other viral diseases: Secondary | ICD-10-CM | POA: Diagnosis not present

## 2017-09-06 DIAGNOSIS — Z1211 Encounter for screening for malignant neoplasm of colon: Secondary | ICD-10-CM | POA: Diagnosis not present

## 2017-09-06 DIAGNOSIS — E1165 Type 2 diabetes mellitus with hyperglycemia: Secondary | ICD-10-CM | POA: Diagnosis not present

## 2017-09-06 DIAGNOSIS — Z1231 Encounter for screening mammogram for malignant neoplasm of breast: Secondary | ICD-10-CM | POA: Diagnosis not present

## 2017-09-06 DIAGNOSIS — E782 Mixed hyperlipidemia: Secondary | ICD-10-CM

## 2017-09-06 DIAGNOSIS — Z1239 Encounter for other screening for malignant neoplasm of breast: Secondary | ICD-10-CM

## 2017-09-06 LAB — BAYER DCA HB A1C WAIVED: HB A1C (BAYER DCA - WAIVED): 7.5 % — ABNORMAL HIGH (ref <7.0)

## 2017-09-06 MED ORDER — AMLODIPINE BESYLATE 2.5 MG PO TABS: 2.5000 mg | ORAL_TABLET | Freq: Every day | ORAL | 1 refills | Status: DC

## 2017-09-06 MED ORDER — PRAVASTATIN SODIUM 10 MG PO TABS: 10.0000 mg | ORAL_TABLET | Freq: Every day | ORAL | 1 refills | Status: DC

## 2017-09-06 MED ORDER — METFORMIN HCL 1000 MG PO TABS: 1000.0000 mg | ORAL_TABLET | Freq: Two times a day (BID) | ORAL | 1 refills | Status: DC

## 2017-09-06 MED ORDER — PIOGLITAZONE HCL 15 MG PO TABS: 15.0000 mg | ORAL_TABLET | Freq: Every day | ORAL | 1 refills | Status: DC

## 2017-09-06 NOTE — Assessment & Plan Note (Signed)
To goal last visit.

## 2017-09-06 NOTE — Patient Instructions (Signed)
Angela Russell , Thank you for taking time to come for your Medicare Wellness Visit. I appreciate your ongoing commitment to your health goals. Please review the following plan we discussed and let me know if I can assist you in the future.   Screening recommendations/referrals: Colonoscopy: cologuard ordered Mammogram: Please call [phone] to schedule your mammogram.  Bone Density: declined Recommended yearly ophthalmology/optometry visit for glaucoma screening and checkup Recommended yearly dental visit for hygiene and checkup  Vaccinations: Influenza vaccine: up to date Pneumococcal vaccine: up to date, pneumovax 23 due 05/2018 Tdap vaccine: check with your insurance company for coverage  Shingles vaccine: elgibile, check with your insurance company for coverage   Advanced directives: Please bring a copy of your health care power of attorney and living will to the office at your convenience.  Conditions/risks identified:  Increase water intake to 4-5 glasses if possible  Next appointment: Follow up in one year for your annual wellness exam.    Preventive Care 65 Years and Older, Female Preventive care refers to lifestyle choices and visits with your health care provider that can promote health and wellness. What does preventive care include?  A yearly physical exam. This is also called an annual well check.  Dental exams once or twice a year.  Routine eye exams. Ask your health care provider how often you should have your eyes checked.  Personal lifestyle choices, including:  Daily care of your teeth and gums.  Regular physical activity.  Eating a healthy diet.  Avoiding tobacco and drug use.  Limiting alcohol use.  Practicing safe sex.  Taking low-dose aspirin every day.  Taking vitamin and mineral supplements as recommended by your health care provider. What happens during an annual well check? The services and screenings done by your health care provider  during your annual well check will depend on your age, overall health, lifestyle risk factors, and family history of disease. Counseling  Your health care provider may ask you questions about your:  Alcohol use.  Tobacco use.  Drug use.  Emotional well-being.  Home and relationship well-being.  Sexual activity.  Eating habits.  History of falls.  Memory and ability to understand (cognition).  Work and work Statistician.  Reproductive health. Screening  You may have the following tests or measurements:  Height, weight, and BMI.  Blood pressure.  Lipid and cholesterol levels. These may be checked every 5 years, or more frequently if you are over 22 years old.  Skin check.  Lung cancer screening. You may have this screening every year starting at age 20 if you have a 30-pack-year history of smoking and currently smoke or have quit within the past 15 years.  Fecal occult blood test (FOBT) of the stool. You may have this test every year starting at age 70.  Flexible sigmoidoscopy or colonoscopy. You may have a sigmoidoscopy every 5 years or a colonoscopy every 10 years starting at age 61.  Hepatitis C blood test.  Hepatitis B blood test.  Sexually transmitted disease (STD) testing.  Diabetes screening. This is done by checking your blood sugar (glucose) after you have not eaten for a while (fasting). You may have this done every 1-3 years.  Bone density scan. This is done to screen for osteoporosis. You may have this done starting at age 109.  Mammogram. This may be done every 1-2 years. Talk to your health care provider about how often you should have regular mammograms. Talk with your health care provider about your test  results, treatment options, and if necessary, the need for more tests. Vaccines  Your health care provider may recommend certain vaccines, such as:  Influenza vaccine. This is recommended every year.  Tetanus, diphtheria, and acellular pertussis  (Tdap, Td) vaccine. You may need a Td booster every 10 years.  Zoster vaccine. You may need this after age 58.  Pneumococcal 13-valent conjugate (PCV13) vaccine. One dose is recommended after age 57.  Pneumococcal polysaccharide (PPSV23) vaccine. One dose is recommended after age 45. Talk to your health care provider about which screenings and vaccines you need and how often you need them. This information is not intended to replace advice given to you by your health care provider. Make sure you discuss any questions you have with your health care provider. Document Released: 06/17/2015 Document Revised: 02/08/2016 Document Reviewed: 03/22/2015 Elsevier Interactive Patient Education  2017 [place] Prevention in the Home Falls can cause injuries. They can happen to people of all ages. There are many things you can do to make your home safe and to help prevent falls. What can I do on the outside of my home?  Regularly fix the edges of walkways and driveways and fix any cracks.  Remove anything that might make you trip as you walk through a door, such as a raised step or threshold.  Trim any bushes or trees on the path to your home.  Use bright outdoor lighting.  Clear any walking paths of anything that might make someone trip, such as rocks or tools.  Regularly check to see if handrails are loose or broken. Make sure that both sides of any steps have handrails.  Any raised decks and porches should have guardrails on the edges.  Have any leaves, snow, or ice cleared regularly.  Use sand or salt on walking paths during winter.  Clean up any spills in your garage right away. This includes oil or grease spills. What can I do in the bathroom?  Use night lights.  Install grab bars by the toilet and in the tub and shower. Do not use towel bars as grab bars.  Use non-skid mats or decals in the tub or shower.  If you need to sit down in the shower, use a plastic, non-slip  stool.  Keep the floor dry. Clean up any water that spills on the floor as soon as it happens.  Remove soap buildup in the tub or shower regularly.  Attach bath mats securely with double-sided non-slip rug tape.  Do not have throw rugs and other things on the floor that can make you trip. What can I do in the bedroom?  Use night lights.  Make sure that you have a light by your bed that is easy to reach.  Do not use any sheets or blankets that are too big for your bed. They should not hang down onto the floor.  Have a firm chair that has side arms. You can use this for support while you get dressed.  Do not have throw rugs and other things on the floor that can make you trip. What can I do in the kitchen?  Clean up any spills right away.  Avoid walking on wet floors.  Keep items that you use a lot in easy-to-reach places.  If you need to reach something above you, use a strong step stool that has a grab bar.  Keep electrical cords out of the way.  Do not use floor polish or wax that makes  floors slippery. If you must use wax, use non-skid floor wax.  Do not have throw rugs and other things on the floor that can make you trip. What can I do with my stairs?  Do not leave any items on the stairs.  Make sure that there are handrails on both sides of the stairs and use them. Fix handrails that are broken or loose. Make sure that handrails are as long as the stairways.  Check any carpeting to make sure that it is firmly attached to the stairs. Fix any carpet that is loose or worn.  Avoid having throw rugs at the top or bottom of the stairs. If you do have throw rugs, attach them to the floor with carpet tape.  Make sure that you have a light switch at the top of the stairs and the bottom of the stairs. If you do not have them, ask someone to add them for you. What else can I do to help prevent falls?  Wear shoes that:  Do not have high heels.  Have rubber bottoms.  Are  comfortable and fit you well.  Are closed at the toe. Do not wear sandals.  If you use a stepladder:  Make sure that it is fully opened. Do not climb a closed stepladder.  Make sure that both sides of the stepladder are locked into place.  Ask someone to hold it for you, if possible.  Clearly mark and make sure that you can see:  Any grab bars or handrails.  First and last steps.  Where the edge of each step is.  Use tools that help you move around (mobility aids) if they are needed. These include:  Canes.  Walkers.  Scooters.  Crutches.  Turn on the lights when you go into a dark area. Replace any light bulbs as soon as they burn out.  Set up your furniture so you have a clear path. Avoid moving your furniture around.  If any of your floors are uneven, fix them.  If there are any pets around you, be aware of where they are.  Review your medicines with your doctor. Some medicines can make you feel dizzy. This can increase your chance of falling. Ask your doctor what other things that you can do to help prevent falls. This information is not intended to replace advice given to you by your health care provider. Make sure you discuss any questions you have with your health care provider. Document Released: 03/17/2009 Document Revised: 10/27/2015 Document Reviewed: 06/25/2014 Elsevier Interactive Patient Education  2017 [employer].

## 2017-09-06 NOTE — Assessment & Plan Note (Signed)
Hgb A1C is 7.5%.  Does not want to add medications and plans to cut out juice.

## 2017-09-06 NOTE — Assessment & Plan Note (Signed)
Not to goal but not willing to change medications at this time.

## 2017-09-06 NOTE — Progress Notes (Signed)
Subjective:   Angela Russell is a 67 y.o. female who presents for Medicare Annual (Subsequent) preventive examination.  Review of Systems:   Cardiac Risk Factors include: hypertension;diabetes mellitus;advanced age (>25mn, >>36women);dyslipidemia;obesity (BMI >30kg/m2);smoking/ tobacco exposure     Objective:     Vitals: BP (!) 150/86 (BP Location: Right Arm, Patient Position: Sitting)   Pulse 88   Temp 97.9 F (36.6 C) (Temporal)   Resp 16   Ht 5' 1"  (1.549 m)   Wt 218 lb (98.9 kg)   BMI 41.19 kg/m   Body mass index is 41.19 kg/m.  Advanced Directives 09/06/2017  Does Patient Have a Medical Advance Directive? Yes  Type of Advance Directive Living will    Tobacco Social History   Tobacco Use  Smoking Status Former Smoker  . Last attempt to quit: 06/05/1991  . Years since quitting: 26.2  Smokeless Tobacco Never Used     Counseling given: Not Answered   Clinical Intake:  Pre-visit preparation completed: Yes  Pain : No/denies pain     Nutritional Status: BMI > 30  Obese Nutritional Risks: None Diabetes: Yes CBG done?: No Did pt. bring in CBG monitor from home?: No  How often do you need to have someone help you when you read instructions, pamphlets, or other written materials from your doctor or pharmacy?: 1 - Never What is the last grade level you completed in school?: 10th grade  Interpreter Needed?: No  Information entered by :: Tamey Wanek,LPN   Past Medical History:  Diagnosis Date  . Carotid artery plaque, right 01/2014  . CKD (chronic kidney disease)    stage 2-3  . Diabetes mellitus without complication ([place]   . DM (diabetes [place]), type 2, uncontrolled ([place]   . Hyperlipidemia   . Hypertension   . Hypochromic microcytic anemia    mild  . Osteoporosis   . Renal insufficiency   . Rotator cuff tendonitis, right    Past Surgical History:  Procedure Laterality Date  . ABDOMINAL HYSTERECTOMY  2000   due to bleeding and fibroids,  partial- still has ovaries  . KNEE SURGERY Left 03/17/2013   torn meniscus   Family History  Problem Relation Age of Onset  . Heart disease Mother   . Arthritis Father   . Diabetes Brother    Social History   Socioeconomic History  . Marital status: Legally Separated    Spouse name: Not on file  . Number of children: Not on file  . Years of education: Not on file  . Highest education level: Not on file  Occupational History  . Not on file  Social Needs  . Financial resource strain: Not hard at all  . Food insecurity:    Worry: Never true    Inability: Never true  . Transportation needs:    Medical: No    Non-medical: No  Tobacco Use  . Smoking status: Former Smoker    Last attempt to quit: 06/05/1991    Years since quitting: 26.2  . Smokeless tobacco: Never Used  Substance and Sexual Activity  . Alcohol use: No  . Drug use: No  . Sexual activity: Never  Lifestyle  . Physical activity:    Days per week: 2 days    Minutes per session: 30 min  . Stress: Not at all  Relationships  . Social connections:    Talks on phone: More than three times a week    Gets together: More than three times a week  Attends religious service: More than 4 times per year    Active member of club or organization: Yes    Attends meetings of clubs or organizations: More than 4 times per year    Relationship status: Separated  Other Topics Concern  . Not on file  Social History Narrative   Working full time    Outpatient Encounter Medications as of 09/06/2017  Medication Sig  . amLODipine (NORVASC) 2.5 MG tablet Take 2.5 mg by mouth daily.  . blood glucose meter kit and supplies Dispense based on patient and insurance preference. Use up to four times daily as directed. (FOR ICD-9 250.00, 250.01).  Marland Kitchen lisinopril-hydrochlorothiazide (PRINZIDE,ZESTORETIC) 20-12.5 MG tablet Take 1 tablet by mouth 2 (two) times daily.  . metFORMIN (GLUCOPHAGE) 1000 MG tablet Take 1,000 mg by mouth 2 (two)  times daily.  . pioglitazone (ACTOS) 15 MG tablet Take 15 mg by mouth daily.  . pravastatin (PRAVACHOL) 10 MG tablet Take 10 mg by mouth at bedtime.   No facility-administered encounter medications on file as of 09/06/2017.     Activities of Daily Living In your present state of health, do you have any difficulty performing the following activities: 09/06/2017 01/11/2017  Hearing? N N  Vision? N N  Difficulty concentrating or making decisions? N N  Walking or climbing stairs? N N  Dressing or bathing? N N  Doing errands, shopping? N N  Preparing Food and eating ? N -  Using the Toilet? N -  In the past six months, have you accidently leaked urine? N -  Do you have problems with loss of bowel control? N -  Managing your Medications? N -  Managing your Finances? N -  Housekeeping or managing your Housekeeping? N -  Some recent data might be hidden    Patient Care Team: Kathrine Haddock, NP as PCP - General (Nurse Practitioner) Idelle Leech, OD (Optometry)    Assessment:   This is a routine wellness examination for [facility]  Exercise Activities and Dietary recommendations Current Exercise Habits: Home exercise routine, Time (Minutes): 30, Frequency (Times/Week): 2, Weekly Exercise (Minutes/Week): 60, Exercise limited by: None identified  Goals    . DIET - INCREASE WATER INTAKE     Recommend drinking at least 6-8 glasses of water a day. Increase water intake to 4-5 glasses if possible        Fall Risk Fall Risk  09/06/2017 01/11/2017  Falls in the past year? No No   Is the patient's home free of loose throw rugs in walkways, pet beds, electrical cords, etc?   yes      Grab bars in the bathroom? no      Handrails on the stairs?   no      Adequate lighting?   yes  Timed Get Up and Go performed: Completed in 8 seconds with no use of assistive devices, steady gait. No intervention needed at this time.   Depression Screen PHQ 2/9 Scores 09/06/2017 01/11/2017  PHQ - 2 Score 1 0  PHQ-  9 Score - 0     Cognitive Function     6CIT Screen 09/06/2017  What Year? 0 points  What month? 0 points  What time? 0 points  Count back from 20 0 points  Months in reverse 0 points  Repeat phrase 0 points  Total Score 0    Immunization History  Administered Date(s) Administered  . Influenza Split 03/25/2015  . Influenza, High Dose Seasonal PF 05/31/2017  . Pneumococcal  Conjugate-13 05/04/2017  . Pneumococcal Polysaccharide-23 04/09/2013    Qualifies for Shingles Vaccine? Yes, discussed shingrix vaccine   Screening Tests Health Maintenance  Topic Date Due  . FOOT EXAM  08/26/1959  . TETANUS/TDAP  05/31/2018 (Originally 08/25/1968)  . Hepatitis C Screening  05/31/2018 (Originally May 17, 1951)  . MAMMOGRAM  07/05/2018 (Originally 08/26/1999)  . DEXA SCAN  07/05/2018 (Originally 08/26/2014)  . COLONOSCOPY  07/05/2018 (Originally 08/26/1999)  . OPHTHALMOLOGY EXAM  10/01/2017  . HEMOGLOBIN A1C  11/29/2017  . INFLUENZA VACCINE  01/02/2018  . PNA vac Low Risk Adult (2 of 2 - PPSV23) 05/04/2018    Cancer Screenings: Lung: Low Dose CT Chest recommended if Age 23-80 years, 30 pack-year currently smoking OR have quit w/in 15years. Patient does not qualify. Breast:  Up to date on Mammogram? No  Ordered  Up to date of Bone Density/Dexa? No declined Colorectal: cologuard ordered   Additional Screenings:  Hepatitis C Screening: will order for future      Plan:    I have personally reviewed and addressed the Medicare Annual Wellness questionnaire and have noted the following in the patient's chart:  A. Medical and social history B. Use of alcohol, tobacco or illicit drugs  C. Current medications and supplements D. Functional ability and status E.  Nutritional status F.  Physical activity G. Advance directives H. List of other physicians I.  Hospitalizations, surgeries, and ER visits in previous 12 months J.  [place] such as hearing and vision if needed, cognitive  and depression L. Referrals and appointments   In addition, I have reviewed and discussed with patient certain preventive protocols, quality metrics, and best practice recommendations. A written personalized care plan for preventive services as well as general preventive health recommendations were provided to patient.   Signed,  Tyler Aas, LPN Nurse Health Advisor   Nurse Notes:none

## 2017-09-06 NOTE — Progress Notes (Signed)
BP (!) 141/78 (BP Location: Right Arm, Cuff Size: Large)   Pulse 89   Temp 97.9 F (36.6 C) (Oral)   Ht 5\' 1"  (1.549 m)   Wt 218 lb (98.9 kg)   SpO2 100%   BMI 41.19 kg/m    Subjective:    Patient ID: Angela Russell, female    DOB: 09/10/1950, 67 y.o.   MRN: [id number]  HPI: Angela Russell is a 67 y.o. female  Chief Complaint  Patient presents with  . Diabetes  . Hyperlipidemia  . Hypertension   Diabetes: Using medications without difficulties No hypoglycemic episodes No hyperglycemic episodes Feet problems: none Blood Sugars averaging:Machine is brokien eye exam within last year Last Hgb A1C:7.1%  Hypertension  Using medications without difficulty Average home BPs   Using medication without problems or lightheadedness No chest pain with exertion or shortness of breath No Edema  Elevated Cholesterol Using medications without problems No Muscle aches    Relevant past medical, surgical, family and social history reviewed and updated as indicated. Interim medical history since our last visit reviewed. Allergies and medications reviewed and updated.  Review of Systems  Per HPI unless specifically indicated above     Objective:    BP (!) 141/78 (BP Location: Right Arm, Cuff Size: Large)   Pulse 89   Temp 97.9 F (36.6 C) (Oral)   Ht 5\' 1"  (1.549 m)   Wt 218 lb (98.9 kg)   SpO2 100%   BMI 41.19 kg/m   Wt Readings from Last 3 Encounters:  09/06/17 218 lb (98.9 kg)  09/06/17 218 lb (98.9 kg)  07/05/17 215 lb 3.2 oz (97.6 kg)    Physical Exam  Constitutional: She is oriented to person, place, and time. She appears well-developed and well-nourished. No distress.  HENT:  Head: Normocephalic and atraumatic.  Eyes: Conjunctivae and lids are normal. Right eye exhibits no discharge. Left eye exhibits no discharge. No scleral icterus.  Neck: Normal range of motion. Neck supple. No JVD present. Carotid bruit is not present.  Cardiovascular: Normal rate,  regular rhythm and normal heart sounds.  Pulmonary/Chest: Effort normal and breath sounds normal.  Abdominal: Normal appearance. There is no splenomegaly or hepatomegaly.  Musculoskeletal: Normal range of motion.  Neurological: She is alert and oriented to person, place, and time.  Skin: Skin is warm, dry and intact. No rash noted. No pallor.  Psychiatric: She has a normal mood and affect. Her behavior is normal. Judgment and thought content normal.    Results for orders placed or performed in visit on 05/31/17  Comprehensive metabolic panel  Result Value Ref Range   Glucose 91 65 - 99 mg/dL   BUN 18 8 - 27 mg/dL   Creatinine, Ser 0.81 0.57 - 1.00 mg/dL   GFR calc non Af Amer 75 >59 mL/min/1.73   GFR calc Af Amer 87 >59 mL/min/1.73   BUN/Creatinine Ratio 22 12 - 28   Sodium 147 (H) 134 - 144 mmol/L   Potassium 4.2 3.5 - 5.2 mmol/L   Chloride 105 96 - 106 mmol/L   CO2 25 20 - 29 mmol/L   Calcium 9.5 8.7 - 10.3 mg/dL   Total Protein 6.7 6.0 - 8.5 g/dL   Albumin 4.1 3.6 - 4.8 g/dL   Globulin, Total 2.6 1.5 - 4.5 g/dL   Albumin/Globulin Ratio 1.6 1.2 - 2.2   Bilirubin Total 0.2 0.0 - 1.2 mg/dL   Alkaline Phosphatase 91 39 - 117 IU/L   AST 17  0 - 40 IU/L   ALT 14 0 - 32 IU/L  Lipid Panel w/o Chol/HDL Ratio  Result Value Ref Range   Cholesterol, Total 162 100 - 199 mg/dL   Triglycerides 62 0 - 149 mg/dL   HDL 66 >39 mg/dL   VLDL Cholesterol Cal 12 5 - 40 mg/dL   LDL Calculated 84 0 - 99 mg/dL  Bayer DCA Hb A1c Waived  Result Value Ref Range   Bayer DCA Hb A1c Waived 7.1 (H) <7.0 %      Assessment & Plan:   Problem List Items Addressed This Visit      Unprioritized   DM (diabetes mellitus), type 2, uncontrolled ([place]) - Primary    Hgb A1C is 7.5%.  Does not want to add medications and plans to cut out juice.        Relevant Medications   pioglitazone (ACTOS) 15 MG tablet   pravastatin (PRAVACHOL) 10 MG tablet   metFORMIN (GLUCOPHAGE) 1000 MG tablet   Other Relevant  Orders   Comprehensive metabolic panel   Bayer DCA Hb A1c Waived   Hyperlipidemia    To goal last visit.        Relevant Medications   amLODipine (NORVASC) 2.5 MG tablet   pravastatin (PRAVACHOL) 10 MG tablet   Hypertension    Not to goal but not willing to change medications at this time.        Relevant Medications   amLODipine (NORVASC) 2.5 MG tablet   pravastatin (PRAVACHOL) 10 MG tablet       Follow up plan: Return in about 3 months (around 12/06/2017).

## 2017-09-07 LAB — COMPREHENSIVE METABOLIC PANEL
ALT: 11 IU/L (ref 0–32)
AST: 14 IU/L (ref 0–40)
Albumin/Globulin Ratio: 1.4 (ref 1.2–2.2)
Albumin: 4.2 g/dL (ref 3.6–4.8)
Alkaline Phosphatase: 100 IU/L (ref 39–117)
BUN/Creatinine Ratio: 18 (ref 12–28)
BUN: 18 mg/dL (ref 8–27)
Bilirubin Total: 0.3 mg/dL (ref 0.0–1.2)
CO2: 23 mmol/L (ref 20–29)
Calcium: 9.2 mg/dL (ref 8.7–10.3)
Chloride: 104 mmol/L (ref 96–106)
Creatinine, Ser: 1 mg/dL (ref 0.57–1.00)
GFR calc Af Amer: 67 mL/min/{1.73_m2} (ref >59)
GFR calc non Af Amer: 58 mL/min/{1.73_m2} — ABNORMAL LOW (ref >59)
Globulin, Total: 3.1 g/dL (ref 1.5–4.5)
Glucose: 82 mg/dL (ref 65–99)
Potassium: 4.3 mmol/L (ref 3.5–5.2)
Sodium: 144 mmol/L (ref 134–144)
Total Protein: 7.3 g/dL (ref 6.0–8.5)

## 2017-09-09 ENCOUNTER — Encounter: Payer: Self-pay | Admitting: Unknown Physician Specialty

## 2017-10-07 ENCOUNTER — Ambulatory Visit: Payer: Medicare Other | Admitting: Family Medicine

## 2017-12-06 ENCOUNTER — Ambulatory Visit (INDEPENDENT_AMBULATORY_CARE_PROVIDER_SITE_OTHER): Payer: Medicare Other | Admitting: Unknown Physician Specialty

## 2017-12-06 ENCOUNTER — Encounter: Payer: Self-pay | Admitting: Unknown Physician Specialty

## 2017-12-06 ENCOUNTER — Other Ambulatory Visit: Payer: Self-pay | Admitting: Unknown Physician Specialty

## 2017-12-06 ENCOUNTER — Other Ambulatory Visit: Payer: Self-pay

## 2017-12-06 VITALS — BP 150/82 | HR 88 | Temp 98.6°F | Ht 61.0 in | Wt 214.0 lb

## 2017-12-06 DIAGNOSIS — E1165 Type 2 diabetes mellitus with hyperglycemia: Secondary | ICD-10-CM | POA: Diagnosis not present

## 2017-12-06 DIAGNOSIS — E782 Mixed hyperlipidemia: Secondary | ICD-10-CM

## 2017-12-06 DIAGNOSIS — I10 Essential (primary) hypertension: Secondary | ICD-10-CM | POA: Diagnosis not present

## 2017-12-06 LAB — BAYER DCA HB A1C WAIVED: HB A1C (BAYER DCA - WAIVED): 7.2 % — ABNORMAL HIGH (ref <7.0)

## 2017-12-06 MED ORDER — AMLODIPINE BESYLATE 5 MG PO TABS
5.0000 mg | ORAL_TABLET | Freq: Every day | ORAL | 3 refills | Status: DC
Start: 2017-12-06 — End: 2018-12-12

## 2017-12-06 NOTE — Assessment & Plan Note (Signed)
Not to goal.  Increase Amlodipine form 2.5 mg to 5 mg

## 2017-12-06 NOTE — Assessment & Plan Note (Signed)
Hgb A1C is 7.2% which si down from 7.5%.  Pt is encouraged and will continue lifestyle changes.

## 2017-12-06 NOTE — Progress Notes (Signed)
BP (!) 150/82   Pulse 88   Temp 98.6 F (37 C) (Oral)   Ht 5\' 1"  (1.549 m)   Wt 214 lb (97.1 kg)   SpO2 98%   BMI 40.43 kg/m    Subjective:    Patient ID: Angela Russell, female    DOB: 1951-04-27, 67 y.o.   MRN: [id number]  HPI: Angela Russell is a 67 y.o. female  Chief Complaint  Patient presents with  . Diabetes   Diabetes: Using medications without difficulties No hypoglycemic episodes No hyperglycemic episodes Feet problems:none Blood Sugars averaging: "doesn't do right" Last Hgb A1C: 7.5%  Hypertension  Using medications without difficulty Average home BPs "a little high"  Using medication without problems or lightheadedness No chest pain with exertion or shortness of breath No Edema  Elevated Cholesterol Using medications without problems No Muscle aches  Diet: Exercise:Not good   Relevant past medical, surgical, family and social history reviewed and updated as indicated. Interim medical history since our last visit reviewed. Allergies and medications reviewed and updated.  Review of Systems  Per HPI unless specifically indicated above     Objective:    BP (!) 150/82   Pulse 88   Temp 98.6 F (37 C) (Oral)   Ht 5\' 1"  (1.549 m)   Wt 214 lb (97.1 kg)   SpO2 98%   BMI 40.43 kg/m   Wt Readings from Last 3 Encounters:  12/06/17 214 lb (97.1 kg)  09/06/17 218 lb (98.9 kg)  09/06/17 218 lb (98.9 kg)    Physical Exam  Constitutional: She is oriented to person, place, and time. She appears well-developed and well-nourished. No distress.  HENT:  Head: Normocephalic and atraumatic.  Eyes: Conjunctivae and lids are normal. Right eye exhibits no discharge. Left eye exhibits no discharge. No scleral icterus.  Neck: Normal range of motion. Neck supple. No JVD present. Carotid bruit is not present.  Cardiovascular: Normal rate, regular rhythm and normal heart sounds.  Pulmonary/Chest: Effort normal and breath sounds normal.  Abdominal: Normal  appearance. There is no splenomegaly or hepatomegaly.  Musculoskeletal: Normal range of motion.  Neurological: She is alert and oriented to person, place, and time.  Skin: Skin is warm, dry and intact. No rash noted. No pallor.  Psychiatric: She has a normal mood and affect. Her behavior is normal. Judgment and thought content normal.   Diabetic Foot Exam - Simple   Simple Foot Form Diabetic Foot exam was performed with the following findings:  Yes 12/06/2017  4:47 PM  Visual Inspection No deformities, no ulcerations, no other skin breakdown bilaterally:  Yes Sensation Testing Intact to touch and monofilament testing bilaterally:  Yes Pulse Check Posterior Tibialis and Dorsalis pulse intact bilaterally:  Yes Comments      Results for orders placed or performed in visit on 12/06/17  Bayer DCA Hb A1c Waived  Result Value Ref Range   HB A1C (BAYER DCA - WAIVED) 7.2 (H) <7.0 %      Assessment & Plan:   Problem List Items Addressed This Visit      Unprioritized   DM (diabetes mellitus), type 2, uncontrolled ([place]) - Primary    Hgb A1C is 7.2% which si down from 7.5%.  Pt is encouraged and will continue lifestyle changes.        Relevant Orders   Comprehensive metabolic panel   Bayer DCA Hb A1c Waived (Completed)   Hyperlipidemia    Stable, continue present medications.  Relevant Medications   amLODipine (NORVASC) 5 MG tablet   Hypertension    Not to goal.  Increase Amlodipine form 2.5 mg to 5 mg      Relevant Medications   amLODipine (NORVASC) 5 MG tablet       Follow up plan: Return in about 3 months (around 03/08/2018).

## 2017-12-06 NOTE — Assessment & Plan Note (Signed)
Stable, continue present medications.   

## 2017-12-07 LAB — COMPREHENSIVE METABOLIC PANEL
ALT: 10 IU/L (ref 0–32)
AST: 15 IU/L (ref 0–40)
Albumin/Globulin Ratio: 1.5 (ref 1.2–2.2)
Albumin: 4.3 g/dL (ref 3.6–4.8)
Alkaline Phosphatase: 92 IU/L (ref 39–117)
BUN/Creatinine Ratio: 23 (ref 12–28)
BUN: 29 mg/dL — ABNORMAL HIGH (ref 8–27)
Bilirubin Total: 0.3 mg/dL (ref 0.0–1.2)
CO2: 24 mmol/L (ref 20–29)
Calcium: 10 mg/dL (ref 8.7–10.3)
Chloride: 104 mmol/L (ref 96–106)
Creatinine, Ser: 1.24 mg/dL — ABNORMAL HIGH (ref 0.57–1.00)
GFR calc Af Amer: 52 mL/min/{1.73_m2} — ABNORMAL LOW (ref >59)
GFR calc non Af Amer: 45 mL/min/{1.73_m2} — ABNORMAL LOW (ref >59)
Globulin, Total: 2.9 g/dL (ref 1.5–4.5)
Glucose: 83 mg/dL (ref 65–99)
Potassium: 4.6 mmol/L (ref 3.5–5.2)
Sodium: 143 mmol/L (ref 134–144)
Total Protein: 7.2 g/dL (ref 6.0–8.5)

## 2017-12-09 ENCOUNTER — Encounter: Payer: Self-pay | Admitting: Unknown Physician Specialty

## 2018-01-14 DIAGNOSIS — H2513 Age-related nuclear cataract, bilateral: Secondary | ICD-10-CM | POA: Diagnosis not present

## 2018-01-14 DIAGNOSIS — Z7984 Long term (current) use of oral hypoglycemic drugs: Secondary | ICD-10-CM | POA: Diagnosis not present

## 2018-01-14 DIAGNOSIS — H52223 Regular astigmatism, bilateral: Secondary | ICD-10-CM | POA: Diagnosis not present

## 2018-01-14 DIAGNOSIS — H524 Presbyopia: Secondary | ICD-10-CM | POA: Diagnosis not present

## 2018-01-14 DIAGNOSIS — H5203 Hypermetropia, bilateral: Secondary | ICD-10-CM | POA: Diagnosis not present

## 2018-01-14 DIAGNOSIS — E113292 Type 2 diabetes mellitus with mild nonproliferative diabetic retinopathy without macular edema, left eye: Secondary | ICD-10-CM | POA: Diagnosis not present

## 2018-01-14 DIAGNOSIS — E119 Type 2 diabetes mellitus without complications: Secondary | ICD-10-CM | POA: Diagnosis not present

## 2018-01-14 LAB — HM DIABETES EYE EXAM

## 2018-04-11 ENCOUNTER — Encounter: Payer: Self-pay | Admitting: Family Medicine

## 2018-04-11 ENCOUNTER — Ambulatory Visit (INDEPENDENT_AMBULATORY_CARE_PROVIDER_SITE_OTHER): Payer: Medicare Other | Admitting: Family Medicine

## 2018-04-11 VITALS — BP 149/78 | HR 93 | Temp 98.4°F | Ht 61.0 in | Wt 214.1 lb

## 2018-04-11 DIAGNOSIS — M25511 Pain in right shoulder: Secondary | ICD-10-CM | POA: Diagnosis not present

## 2018-04-11 DIAGNOSIS — E1165 Type 2 diabetes mellitus with hyperglycemia: Secondary | ICD-10-CM

## 2018-04-11 DIAGNOSIS — E782 Mixed hyperlipidemia: Secondary | ICD-10-CM

## 2018-04-11 DIAGNOSIS — Z23 Encounter for immunization: Secondary | ICD-10-CM | POA: Diagnosis not present

## 2018-04-11 DIAGNOSIS — G8929 Other chronic pain: Secondary | ICD-10-CM | POA: Diagnosis not present

## 2018-04-11 DIAGNOSIS — I10 Essential (primary) hypertension: Secondary | ICD-10-CM | POA: Diagnosis not present

## 2018-04-11 MED ORDER — DICLOFENAC SODIUM 1 % TD GEL: 2.0000 g | Freq: Four times a day (QID) | TRANSDERMAL | 2 refills | Status: DC

## 2018-04-11 MED ORDER — PRAVASTATIN SODIUM 10 MG PO TABS: 10.0000 mg | ORAL_TABLET | Freq: Every day | ORAL | 1 refills | Status: DC

## 2018-04-11 MED ORDER — METFORMIN HCL 1000 MG PO TABS: 1000.0000 mg | ORAL_TABLET | Freq: Two times a day (BID) | ORAL | 1 refills | Status: DC

## 2018-04-11 MED ORDER — PIOGLITAZONE HCL 15 MG PO TABS: 15.0000 mg | ORAL_TABLET | Freq: Every day | ORAL | 1 refills | Status: DC

## 2018-04-11 NOTE — Progress Notes (Signed)
BP (!) 149/78   Pulse 93   Temp 98.4 F (36.9 C) (Oral)   Ht 5\' 1"  (1.549 m)   Wt 214 lb 1.6 oz (97.1 kg)   SpO2 100%   BMI 40.45 kg/m    Subjective:    Patient ID: Angela Russell, female    DOB: 10-11-1950, 67 y.o.   MRN: [id number]  HPI: Angela Russell is a 67 y.o. female  Chief Complaint  Patient presents with  . Diabetes  . Hyperlipidemia  . Hypertension   Feels like she got off track on her diet and exercise. Has been eating a lot of pork lately and fast food and halloween candy. Taking metformin and actos without low blood sugar spells or side effects.   Amlodipine was increased to 5 mg at last visit in addition to the lisinopril HCTZ and BPs have been running well. Denies side effects, CP, SOB, dizziness, HAs. Taking pravastatin for HLD, no concerns, claudication, myalgias.   Worsened R shoulder pain at night, known rotator cuff injury in the past. Trying ibuprofen which helped a lot.   Past Medical History:  Diagnosis Date  . Carotid artery plaque, right 01/2014  . CKD (chronic kidney disease)    stage 2-3  . Diabetes mellitus without complication ([place])   . DM (diabetes mellitus), type 2, uncontrolled ([place])   . Hyperlipidemia   . Hypertension   . Hypochromic microcytic anemia    mild  . Osteoporosis   . Renal insufficiency   . Rotator cuff tendonitis, right    Social History   Socioeconomic History  . Marital status: Legally Separated    Spouse name: Not on file  . Number of children: Not on file  . Years of education: Not on file  . Highest education level: Not on file  Occupational History  . Not on file  Social Needs  . Financial resource strain: Not hard at all  . Food insecurity:    Worry: Never true    Inability: Never true  . Transportation needs:    Medical: No    Non-medical: No  Tobacco Use  . Smoking status: Former Smoker    Last attempt to quit: 06/05/1991    Years since quitting: 26.8  . Smokeless tobacco: Never Used    Substance and Sexual Activity  . Alcohol use: No  . Drug use: No  . Sexual activity: Never  Lifestyle  . Physical activity:    Days per week: 2 days    Minutes per session: 30 min  . Stress: Not at all  Relationships  . Social connections:    Talks on phone: More than three times a week    Gets together: More than three times a week    Attends religious service: More than 4 times per year    Active member of club or organization: Yes    Attends meetings of clubs or organizations: More than 4 times per year    Relationship status: Separated  . Intimate partner violence:    Fear of current or ex partner: No    Emotionally abused: No    Physically abused: No    Forced sexual activity: No  Other Topics Concern  . Not on file  Social History Narrative   Working full time    Relevant past medical, surgical, family and social history reviewed and updated as indicated. Interim medical history since our last visit reviewed. Allergies and medications reviewed and updated.  Review of Systems  Per HPI unless specifically indicated above     Objective:    BP (!) 149/78   Pulse 93   Temp 98.4 F (36.9 C) (Oral)   Ht 5\' 1"  (1.549 m)   Wt 214 lb 1.6 oz (97.1 kg)   SpO2 100%   BMI 40.45 kg/m   Wt Readings from Last 3 Encounters:  04/11/18 214 lb 1.6 oz (97.1 kg)  12/06/17 214 lb (97.1 kg)  09/06/17 218 lb (98.9 kg)    Physical Exam  Constitutional: She is oriented to person, place, and time. She appears well-developed and well-nourished. No distress.  HENT:  Head: Atraumatic.  Eyes: Conjunctivae and EOM are normal.  Neck: Normal range of motion. Neck supple.  Cardiovascular: Normal rate, regular rhythm and normal heart sounds.  Pulmonary/Chest: Effort normal and breath sounds normal.  Musculoskeletal: Normal range of motion.  Neurological: She is alert and oriented to person, place, and time.  Skin: Skin is warm and dry.  Psychiatric: She has a normal mood and affect.  Her behavior is normal.  Nursing note and vitals reviewed.   Results for orders placed or performed in visit on 04/11/18  Comprehensive metabolic panel  Result Value Ref Range   Glucose 77 65 - 99 mg/dL   BUN 21 8 - 27 mg/dL   Creatinine, Ser 1.05 (H) 0.57 - 1.00 mg/dL   GFR calc non Af Amer 55 (L) >59 mL/min/1.73   GFR calc Af Amer 63 >59 mL/min/1.73   BUN/Creatinine Ratio 20 12 - 28   Sodium 145 (H) 134 - 144 mmol/L   Potassium 3.8 3.5 - 5.2 mmol/L   Chloride 104 96 - 106 mmol/L   CO2 23 20 - 29 mmol/L   Calcium 9.2 8.7 - 10.3 mg/dL   Total Protein 6.8 6.0 - 8.5 g/dL   Albumin 4.3 3.6 - 4.8 g/dL   Globulin, Total 2.5 1.5 - 4.5 g/dL   Albumin/Globulin Ratio 1.7 1.2 - 2.2   Bilirubin Total 0.2 0.0 - 1.2 mg/dL   Alkaline Phosphatase 99 39 - 117 IU/L   AST 13 0 - 40 IU/L   ALT 8 0 - 32 IU/L  Lipid Panel w/o Chol/HDL Ratio  Result Value Ref Range   Cholesterol, Total 159 100 - 199 mg/dL   Triglycerides 78 0 - 149 mg/dL   HDL 61 >39 mg/dL   VLDL Cholesterol Cal 16 5 - 40 mg/dL   LDL Calculated 82 0 - 99 mg/dL  HgB A1c  Result Value Ref Range   Hgb A1c MFr Bld 6.6 (H) 4.8 - 5.6 %   Est. average glucose Bld gHb Est-mCnc 143 mg/dL      Assessment & Plan:   Problem List Items Addressed This Visit      Cardiovascular and Mediastinum   Hypertension - Primary    Work on lifestyle modifications, recheck at next visit. Home readings WNL.       Relevant Medications   pravastatin (PRAVACHOL) 10 MG tablet   Other Relevant Orders   Comprehensive metabolic panel (Completed)     Endocrine   DM (diabetes mellitus), type 2, uncontrolled ([place])    Recheck A1C, work on getting back on track with lifestyle      Relevant Medications   metFORMIN (GLUCOPHAGE) 1000 MG tablet   pioglitazone (ACTOS) 15 MG tablet   pravastatin (PRAVACHOL) 10 MG tablet   Other Relevant Orders   HgB A1c (Completed)     Other   Hyperlipidemia    Recheck  lipids, adjust as needed. Pt will work on  getting back on track with diet and exercise      Relevant Medications   pravastatin (PRAVACHOL) 10 MG tablet   Other Relevant Orders   Lipid Panel w/o Chol/HDL Ratio (Completed)    Other Visit Diagnoses    Chronic right shoulder pain       Start diclofenac gel, will refer to orthopedics if not improving   Need for influenza vaccination       Relevant Orders   Flu vaccine HIGH DOSE PF (Completed)       Follow up plan: Return in about 3 months (around 07/12/2018) for DM.

## 2018-04-11 NOTE — Patient Instructions (Signed)

## 2018-04-12 LAB — COMPREHENSIVE METABOLIC PANEL
ALT: 8 IU/L (ref 0–32)
AST: 13 IU/L (ref 0–40)
Albumin/Globulin Ratio: 1.7 (ref 1.2–2.2)
Albumin: 4.3 g/dL (ref 3.6–4.8)
Alkaline Phosphatase: 99 IU/L (ref 39–117)
BUN/Creatinine Ratio: 20 (ref 12–28)
BUN: 21 mg/dL (ref 8–27)
Bilirubin Total: 0.2 mg/dL (ref 0.0–1.2)
CO2: 23 mmol/L (ref 20–29)
Calcium: 9.2 mg/dL (ref 8.7–10.3)
Chloride: 104 mmol/L (ref 96–106)
Creatinine, Ser: 1.05 mg/dL — ABNORMAL HIGH (ref 0.57–1.00)
GFR calc Af Amer: 63 mL/min/{1.73_m2} (ref >59)
GFR calc non Af Amer: 55 mL/min/{1.73_m2} — ABNORMAL LOW (ref >59)
Globulin, Total: 2.5 g/dL (ref 1.5–4.5)
Glucose: 77 mg/dL (ref 65–99)
Potassium: 3.8 mmol/L (ref 3.5–5.2)
Sodium: 145 mmol/L — ABNORMAL HIGH (ref 134–144)
Total Protein: 6.8 g/dL (ref 6.0–8.5)

## 2018-04-12 LAB — LIPID PANEL W/O CHOL/HDL RATIO
Cholesterol, Total: 159 mg/dL (ref 100–199)
HDL: 61 mg/dL (ref >39)
LDL Calculated: 82 mg/dL (ref 0–99)
Triglycerides: 78 mg/dL (ref 0–149)
VLDL Cholesterol Cal: 16 mg/dL (ref 5–40)

## 2018-04-12 LAB — HEMOGLOBIN A1C
Est. average glucose Bld gHb Est-mCnc: 143 mg/dL
Hgb A1c MFr Bld: 6.6 % — ABNORMAL HIGH (ref 4.8–5.6)

## 2018-04-15 ENCOUNTER — Encounter: Payer: Self-pay | Admitting: Family Medicine

## 2018-04-20 NOTE — Assessment & Plan Note (Signed)
Recheck lipids, adjust as needed. Pt will work on getting back on track with diet and exercise

## 2018-04-20 NOTE — Assessment & Plan Note (Signed)
Work on lifestyle modifications, recheck at next visit. Home readings WNL.

## 2018-04-20 NOTE — Assessment & Plan Note (Signed)
Recheck A1C, work on getting back on track with lifestyle

## 2018-05-04 ENCOUNTER — Other Ambulatory Visit: Payer: Self-pay | Admitting: Unknown Physician Specialty

## 2018-05-05 NOTE — Telephone Encounter (Signed)
[facility] called and spoke to [place], [place]. I asked did they receive the Metformin refill sent on 04/11/18 #180/1 refill, he says the patient picked up on 05/04/18 and has 1 refill left. Refill request refused.

## 2018-07-01 ENCOUNTER — Encounter: Payer: Self-pay | Admitting: Emergency Medicine

## 2018-07-01 ENCOUNTER — Other Ambulatory Visit: Payer: Self-pay

## 2018-07-01 ENCOUNTER — Emergency Department
Admission: EM | Admit: 2018-07-01 | Discharge: 2018-07-01 | Disposition: A | Discharge status: Home / Self Care | Payer: Medicare Other | Attending: Student in an Organized Health Care Education/Training Program | Admitting: Student in an Organized Health Care Education/Training Program

## 2018-07-01 ENCOUNTER — Emergency Department: Payer: Medicare Other

## 2018-07-01 DIAGNOSIS — N3091 Cystitis, unspecified with hematuria: Secondary | ICD-10-CM | POA: Diagnosis not present

## 2018-07-01 DIAGNOSIS — E785 Hyperlipidemia, unspecified: Secondary | ICD-10-CM | POA: Diagnosis not present

## 2018-07-01 DIAGNOSIS — N183 Chronic kidney disease, stage 3 (moderate): Secondary | ICD-10-CM | POA: Insufficient documentation

## 2018-07-01 DIAGNOSIS — R319 Hematuria, unspecified: Secondary | ICD-10-CM | POA: Diagnosis not present

## 2018-07-01 DIAGNOSIS — N3090 Cystitis, unspecified without hematuria: Secondary | ICD-10-CM | POA: Diagnosis not present

## 2018-07-01 DIAGNOSIS — I129 Hypertensive chronic kidney disease with stage 1 through stage 4 chronic kidney disease, or unspecified chronic kidney disease: Secondary | ICD-10-CM | POA: Insufficient documentation

## 2018-07-01 DIAGNOSIS — E119 Type 2 diabetes mellitus without complications: Secondary | ICD-10-CM | POA: Diagnosis not present

## 2018-07-01 DIAGNOSIS — Z79899 Other long term (current) drug therapy: Secondary | ICD-10-CM | POA: Insufficient documentation

## 2018-07-01 DIAGNOSIS — R300 Dysuria: Secondary | ICD-10-CM | POA: Diagnosis present

## 2018-07-01 LAB — CBC
HCT: 35.6 % — ABNORMAL LOW (ref 36.0–46.0)
Hemoglobin: 10.8 g/dL — ABNORMAL LOW (ref 12.0–15.0)
MCH: 23.8 pg — ABNORMAL LOW (ref 26.0–34.0)
MCHC: 30.3 g/dL (ref 30.0–36.0)
MCV: 78.4 fL — ABNORMAL LOW (ref 80.0–100.0)
Platelets: 281 10*3/uL (ref 150–400)
RBC: 4.54 MIL/uL (ref 3.87–5.11)
RDW: 14.1 % (ref 11.5–15.5)
WBC: 7.5 10*3/uL (ref 4.0–10.5)
nRBC: 0 % (ref 0.0–0.2)

## 2018-07-01 LAB — COMPREHENSIVE METABOLIC PANEL
ALT: 14 U/L (ref 0–44)
AST: 21 U/L (ref 15–41)
Albumin: 4.5 g/dL (ref 3.5–5.0)
Alkaline Phosphatase: 102 U/L (ref 38–126)
Anion gap: 7 (ref 5–15)
BUN: 23 mg/dL (ref 8–23)
CO2: 27 mmol/L (ref 22–32)
Calcium: 9.3 mg/dL (ref 8.9–10.3)
Chloride: 104 mmol/L (ref 98–111)
Creatinine, Ser: 1.1 mg/dL — ABNORMAL HIGH (ref 0.44–1.00)
GFR calc Af Amer: 60 mL/min — ABNORMAL LOW (ref >60)
GFR calc non Af Amer: 52 mL/min — ABNORMAL LOW (ref >60)
Glucose, Bld: 101 mg/dL — ABNORMAL HIGH (ref 70–99)
Potassium: 3.8 mmol/L (ref 3.5–5.1)
Sodium: 138 mmol/L (ref 135–145)
Total Bilirubin: 0.5 mg/dL (ref 0.3–1.2)
Total Protein: 8.2 g/dL — ABNORMAL HIGH (ref 6.5–8.1)

## 2018-07-01 LAB — URINALYSIS, COMPLETE (UACMP) WITH MICROSCOPIC
RBC / HPF: >50 RBC/hpf — ABNORMAL HIGH (ref 0–5)
Specific Gravity, Urine: 1.019 (ref 1.005–1.030)
Squamous Epithelial / HPF: NONE SEEN (ref 0–5)
WBC, UA: >50 WBC/hpf — ABNORMAL HIGH (ref 0–5)

## 2018-07-01 MED ORDER — ONDANSETRON HCL 4 MG/2ML IJ SOLN
4.0000 mg | Freq: Once | INTRAMUSCULAR | Status: DC
  Filled 2018-07-01: qty 2

## 2018-07-01 MED ORDER — FLUCONAZOLE 150 MG PO TABS: 150.0000 mg | ORAL_TABLET | Freq: Every day | ORAL | 0 refills | Status: DC

## 2018-07-01 MED ORDER — SODIUM CHLORIDE 0.9 % IV SOLN
1.0000 g | Freq: Once | INTRAVENOUS | Status: AC
  Administered 2018-07-01: 1 g via INTRAVENOUS
  Filled 2018-07-01: qty 10

## 2018-07-01 MED ORDER — FENTANYL CITRATE (PF) 100 MCG/2ML IJ SOLN
50.0000 ug | INTRAMUSCULAR | Status: DC | PRN
  Filled 2018-07-01: qty 2

## 2018-07-01 MED ORDER — CEPHALEXIN 500 MG PO CAPS: 500.0000 mg | ORAL_CAPSULE | Freq: Three times a day (TID) | ORAL | 0 refills | Status: DC

## 2018-07-01 NOTE — ED Provider Notes (Signed)
[facility] Emergency Department Provider Note    First MD Initiated Contact with Patient 07/01/18 2147     (approximate)  I have reviewed the triage vital signs and the nursing notes.   HISTORY  Chief Complaint Hematuria    HPI Angela Russell is a 68 y.o. female the below listed past medical history presents the ER with dysuria hematuria and left lower quadrant inguinal pain as well as flank pain that started today.  States that she has had some nausea with this.  Is never had pain like this before.  Denies any diarrhea constipation or vaginal discharge.  Denies any chest pain or shortness of breath.  No history of kidney stones.  Does have diabetes as well as hypertension.    Past Medical History:  Diagnosis Date  . Carotid artery plaque, right 01/2014  . CKD (chronic kidney disease)    stage 2-3  . Diabetes mellitus without complication ([place])   . DM (diabetes mellitus), type 2, uncontrolled ([place])   . Hyperlipidemia   . Hypertension   . Hypochromic microcytic anemia    mild  . Osteoporosis   . Renal insufficiency   . Rotator cuff tendonitis, right    Family History  Problem Relation Age of Onset  . Heart disease Mother   . Arthritis Father   . Diabetes Brother    Past Surgical History:  Procedure Laterality Date  . ABDOMINAL HYSTERECTOMY  2000   due to bleeding and fibroids, partial- still has ovaries  . KNEE SURGERY Left 03/17/2013   torn meniscus   Patient Active Problem List   Diagnosis Date Noted  . Hyperlipidemia   . Hypertension   . DM (diabetes mellitus), type 2, uncontrolled (Angela Russell)   . Renal insufficiency   . CKD (chronic kidney disease)   . Hypochromic microcytic anemia   . Carotid artery plaque, right 01/02/2014  . Cervical facet joint syndrome 03/20/2013  . Spondylolisthesis of lumbar region 03/20/2013      Prior to Admission medications   Medication Sig Start Date End Date Taking? Authorizing Provider    amLODipine (NORVASC) 5 MG tablet Take 1 tablet (5 mg total) by mouth daily. 12/06/17   Kathrine Haddock, NP  blood glucose meter kit and supplies Dispense based on patient and insurance preference. Use up to four times daily as directed. (FOR ICD-9 250.00, 250.01). 05/31/17   Kathrine Haddock, NP  cephALEXin (KEFLEX) 500 MG capsule Take 1 capsule (500 mg total) by mouth 3 (three) times daily for 7 days. 07/01/18 07/08/18  Merlyn Lot, MD  diclofenac sodium (VOLTAREN) 1 % GEL Apply 2 g topically 4 (four) times daily. 04/11/18   Volney American, PA-C  fluconazole (DIFLUCAN) 150 MG tablet Take 1 tablet (150 mg total) by mouth daily. 07/01/18   Merlyn Lot, MD  lisinopril-hydrochlorothiazide (PRINZIDE,ZESTORETIC) 20-12.5 MG tablet Take 1 tablet by mouth 2 (two) times daily. 01/08/17   [provider]  metFORMIN (GLUCOPHAGE) 1000 MG tablet Take 1 tablet (1,000 mg total) by mouth 2 (two) times daily. 04/11/18   Volney American, PA-C  pioglitazone (ACTOS) 15 MG tablet Take 1 tablet (15 mg total) by mouth daily. 04/11/18   Volney American, PA-C  pravastatin (PRAVACHOL) 10 MG tablet Take 1 tablet (10 mg total) by mouth at bedtime. 04/11/18   Volney American, PA-C    Allergies Patient has no known allergies.    Social History Social History   Tobacco Use  . Smoking status: Former  Smoker    Last attempt to quit: 06/05/1991    Years since quitting: 27.0  . Smokeless tobacco: Never Used  Substance Use Topics  . Alcohol use: No  . Drug use: No    Review of Systems Patient denies headaches, rhinorrhea, blurry vision, numbness, shortness of breath, chest pain, edema, cough, abdominal pain, nausea, vomiting, diarrhea, dysuria, fevers, rashes or hallucinations unless otherwise stated above in HPI. ____________________________________________   PHYSICAL EXAM:  VITAL SIGNS: Vitals:   07/01/18 2035 07/01/18 2300  BP: (!) 161/65 139/68  Pulse: 80 80  Resp: 18 16   Temp: 97.7 F (36.5 C)   SpO2: 99% 98%    Constitutional: Alert and oriented.  Eyes: Conjunctivae are normal.  Head: Atraumatic. Nose: No congestion/rhinnorhea. Mouth/Throat: Mucous membranes are moist.   Neck: No stridor. Painless ROM.  Cardiovascular: Normal rate, regular rhythm. Grossly normal heart sounds.  Good peripheral circulation. Respiratory: Normal respiratory effort.  No retractions. Lungs CTAB. Gastrointestinal: Soft and nontender. No distention. No abdominal bruits. + left CVA tenderness. Genitourinary:  Musculoskeletal: No lower extremity tenderness nor edema.  No joint effusions. Neurologic:  Normal speech and language. No gross focal neurologic deficits are appreciated. No facial droop Skin:  Skin is warm, dry and intact. No rash noted. Psychiatric: Mood and affect are normal. Speech and behavior are normal.  ____________________________________________   LABS (all labs ordered are listed, but only abnormal results are displayed)  Results for orders placed or performed during the hospital encounter of 07/01/18 (from the past 24 hour(s))  Comprehensive metabolic panel     Status: Abnormal   Collection Time: 07/01/18  8:40 PM  Result Value Ref Range   Sodium 138 135 - 145 mmol/L   Potassium 3.8 3.5 - 5.1 mmol/L   Chloride 104 98 - 111 mmol/L   CO2 27 22 - 32 mmol/L   Glucose, Bld 101 (H) 70 - 99 mg/dL   BUN 23 8 - 23 mg/dL   Creatinine, Ser 1.10 (H) 0.44 - 1.00 mg/dL   Calcium 9.3 8.9 - 10.3 mg/dL   Total Protein 8.2 (H) 6.5 - 8.1 g/dL   Albumin 4.5 3.5 - 5.0 g/dL   AST 21 15 - 41 U/L   ALT 14 0 - 44 U/L   Alkaline Phosphatase 102 38 - 126 U/L   Total Bilirubin 0.5 0.3 - 1.2 mg/dL   GFR calc non Af Amer 52 (L) >60 mL/min   GFR calc Af Amer 60 (L) >60 mL/min   Anion gap 7 5 - 15  CBC     Status: Abnormal   Collection Time: 07/01/18  8:40 PM  Result Value Ref Range   WBC 7.5 4.0 - 10.5 K/uL   RBC 4.54 3.87 - 5.11 MIL/uL   Hemoglobin 10.8 (L) 12.0 -  15.0 g/dL   HCT 35.6 (L) 36.0 - 46.0 %   MCV 78.4 (L) 80.0 - 100.0 fL   MCH 23.8 (L) 26.0 - 34.0 pg   MCHC 30.3 30.0 - 36.0 g/dL   RDW 14.1 11.5 - 15.5 %   Platelets 281 150 - 400 K/uL   nRBC 0.0 0.0 - 0.2 %  Urinalysis, Complete w Microscopic     Status: Abnormal   Collection Time: 07/01/18  8:40 PM  Result Value Ref Range   Color, Urine RED (A) YELLOW   APPearance CLOUDY (A) CLEAR   Specific Gravity, Urine 1.019 1.005 - 1.030   pH  5.0 - 8.0    TEST NOT REPORTED  DUE TO COLOR INTERFERENCE OF URINE PIGMENT   Glucose, UA (A) NEGATIVE mg/dL    TEST NOT REPORTED DUE TO COLOR INTERFERENCE OF URINE PIGMENT   Hgb urine dipstick (A) NEGATIVE    TEST NOT REPORTED DUE TO COLOR INTERFERENCE OF URINE PIGMENT   Bilirubin Urine (A) NEGATIVE    TEST NOT REPORTED DUE TO COLOR INTERFERENCE OF URINE PIGMENT   Ketones, ur (A) NEGATIVE mg/dL    TEST NOT REPORTED DUE TO COLOR INTERFERENCE OF URINE PIGMENT   Protein, ur (A) NEGATIVE mg/dL    TEST NOT REPORTED DUE TO COLOR INTERFERENCE OF URINE PIGMENT   Nitrite (A) NEGATIVE    TEST NOT REPORTED DUE TO COLOR INTERFERENCE OF URINE PIGMENT   Leukocytes, UA (A) NEGATIVE    TEST NOT REPORTED DUE TO COLOR INTERFERENCE OF URINE PIGMENT   RBC / HPF >50 (H) 0 - 5 RBC/hpf   WBC, UA >50 (H) 0 - 5 WBC/hpf   Bacteria, UA MANY (A) NONE SEEN   Squamous Epithelial / LPF NONE SEEN 0 - 5   ____________________________________________ ___________________________________  RADIOLOGY  I personally reviewed all radiographic images ordered to evaluate for the above acute complaints and reviewed radiology reports and findings.  These findings were personally discussed with the patient.  Please see medical record for radiology report.  ____________________________________________   PROCEDURES  Procedure(s) performed:  Procedures    Critical Care performed: no ____________________________________________   INITIAL IMPRESSION / ASSESSMENT AND PLAN / ED  COURSE  Pertinent labs & imaging results that were available during my care of the patient were reviewed by me and considered in my medical decision making (see chart for details).   DDX: stone, pyelo, uti, cystitis  Angela Russell is a 68 y.o. who presents to the ED with hematuria and dysuria.  Not any blood thinners.  Patient otherwise well-appearing.  Urinalysis does show many bacteria.  Likely acute cystitis but given her left lower quadrant pain will also order CT imaging to evaluate for stone or obstructive uropathy.  Will give dose of IV Rocephin in anticipation of obstructive stone.  The patient will be placed on continuous pulse oximetry and telemetry for monitoring.  Laboratory evaluation will be sent to evaluate for the above complaints.     Clinical Course as of Jul 01 2306  Tue Jul 01, 2018  2254 Patient reassessed.  We discussed the results of the patient's CT imaging including area of thickened bladder wall.  Will continue with IV antibiotics the patient's pain is relatively well controlled.  I do not see any indication for admission to the hospital at this time.  Will be given prescription for antibiotics as well as referral to urology as an outpatient.  Have discussed with the patient and available family all diagnostics and treatments performed thus far and all questions were answered to the best of my ability. The patient demonstrates understanding and agreement with plan.    [PR]    Clinical Course User Index [PR] Merlyn Lot, MD     As part of my medical decision making, I reviewed the following data within the [place] notes reviewed and incorporated, Labs reviewed, notes from prior ED visits and [place] Controlled Substance Database   ____________________________________________   FINAL CLINICAL IMPRESSION(S) / ED DIAGNOSES  Final diagnoses:  Hematuria, unspecified type  Cystitis      NEW MEDICATIONS STARTED DURING THIS  VISIT:  New Prescriptions   CEPHALEXIN (KEFLEX) 500 MG CAPSULE  Take 1 capsule (500 mg total) by mouth 3 (three) times daily for 7 days.   FLUCONAZOLE (DIFLUCAN) 150 MG TABLET    Take 1 tablet (150 mg total) by mouth daily.     Note:  This document was prepared using Dragon voice recognition software and may include unintentional dictation errors.    Merlyn Lot, MD 07/01/18 2308

## 2018-07-01 NOTE — ED Triage Notes (Signed)
Pt arrived to the ED for complaints of left flank pain and hematuria. Pt reports that she has not been feeling well for the last week and this morning she noticed a considerable amount of blood in the urine. Pt is AOx4 in no apparent distress.

## 2018-07-03 ENCOUNTER — Encounter: Payer: Self-pay | Admitting: Emergency Medicine

## 2018-07-03 ENCOUNTER — Emergency Department
Admission: EM | Admit: 2018-07-03 | Discharge: 2018-07-03 | Disposition: A | Discharge status: Home / Self Care | Payer: Medicare Other | Attending: Emergency Medicine | Admitting: Emergency Medicine

## 2018-07-03 ENCOUNTER — Emergency Department: Payer: Medicare Other

## 2018-07-03 DIAGNOSIS — R300 Dysuria: Secondary | ICD-10-CM | POA: Diagnosis present

## 2018-07-03 DIAGNOSIS — Z7984 Long term (current) use of oral hypoglycemic drugs: Secondary | ICD-10-CM | POA: Insufficient documentation

## 2018-07-03 DIAGNOSIS — E1122 Type 2 diabetes mellitus with diabetic chronic kidney disease: Secondary | ICD-10-CM | POA: Diagnosis not present

## 2018-07-03 DIAGNOSIS — I129 Hypertensive chronic kidney disease with stage 1 through stage 4 chronic kidney disease, or unspecified chronic kidney disease: Secondary | ICD-10-CM | POA: Insufficient documentation

## 2018-07-03 DIAGNOSIS — Z87891 Personal history of nicotine dependence: Secondary | ICD-10-CM | POA: Insufficient documentation

## 2018-07-03 DIAGNOSIS — N3090 Cystitis, unspecified without hematuria: Secondary | ICD-10-CM

## 2018-07-03 DIAGNOSIS — N3091 Cystitis, unspecified with hematuria: Secondary | ICD-10-CM | POA: Insufficient documentation

## 2018-07-03 DIAGNOSIS — R310 Gross hematuria: Secondary | ICD-10-CM

## 2018-07-03 DIAGNOSIS — N183 Chronic kidney disease, stage 3 (moderate): Secondary | ICD-10-CM | POA: Diagnosis not present

## 2018-07-03 DIAGNOSIS — Z79899 Other long term (current) drug therapy: Secondary | ICD-10-CM | POA: Insufficient documentation

## 2018-07-03 LAB — URINALYSIS, COMPLETE (UACMP) WITH MICROSCOPIC
Bacteria, UA: NONE SEEN
RBC / HPF: >50 RBC/hpf — ABNORMAL HIGH (ref 0–5)
Specific Gravity, Urine: 1.013 (ref 1.005–1.030)
Squamous Epithelial / HPF: NONE SEEN (ref 0–5)
WBC, UA: NONE SEEN WBC/hpf (ref 0–5)

## 2018-07-03 LAB — BASIC METABOLIC PANEL
Anion gap: 7 (ref 5–15)
BUN: 24 mg/dL — ABNORMAL HIGH (ref 8–23)
CO2: 28 mmol/L (ref 22–32)
Calcium: 9.5 mg/dL (ref 8.9–10.3)
Chloride: 104 mmol/L (ref 98–111)
Creatinine, Ser: 0.99 mg/dL (ref 0.44–1.00)
GFR calc Af Amer: >60 mL/min (ref >60)
GFR calc non Af Amer: 59 mL/min — ABNORMAL LOW (ref >60)
Glucose, Bld: 114 mg/dL — ABNORMAL HIGH (ref 70–99)
Potassium: 4.2 mmol/L (ref 3.5–5.1)
Sodium: 139 mmol/L (ref 135–145)

## 2018-07-03 LAB — CBC
HCT: 33.2 % — ABNORMAL LOW (ref 36.0–46.0)
Hemoglobin: 10.2 g/dL — ABNORMAL LOW (ref 12.0–15.0)
MCH: 23.9 pg — ABNORMAL LOW (ref 26.0–34.0)
MCHC: 30.7 g/dL (ref 30.0–36.0)
MCV: 77.9 fL — ABNORMAL LOW (ref 80.0–100.0)
Platelets: 302 10*3/uL (ref 150–400)
RBC: 4.26 MIL/uL (ref 3.87–5.11)
RDW: 14.3 % (ref 11.5–15.5)
WBC: 6 10*3/uL (ref 4.0–10.5)
nRBC: 0 % (ref 0.0–0.2)

## 2018-07-03 LAB — URINE CULTURE: Culture: NO GROWTH

## 2018-07-03 MED ORDER — SODIUM CHLORIDE 0.9 % IV BOLUS
1000.0000 mL | Freq: Once | INTRAVENOUS | Status: AC
  Administered 2018-07-03: 1000 mL via INTRAVENOUS

## 2018-07-03 MED ORDER — CEPHALEXIN 500 MG PO CAPS: 500.0000 mg | ORAL_CAPSULE | Freq: Two times a day (BID) | ORAL | 0 refills | Status: AC

## 2018-07-03 MED ORDER — SODIUM CHLORIDE 0.9 % IV SOLN
1.0000 g | Freq: Once | INTRAVENOUS | Status: AC
  Administered 2018-07-03: 1 g via INTRAVENOUS
  Filled 2018-07-03: qty 10

## 2018-07-03 MED ORDER — IOPAMIDOL (ISOVUE-300) INJECTION 61%
100.0000 mL | Freq: Once | INTRAVENOUS | Status: AC | PRN
  Administered 2018-07-03: 100 mL via INTRAVENOUS
  Filled 2018-07-03: qty 100

## 2018-07-03 NOTE — ED Triage Notes (Signed)
Pt reports was here Tuesday and diagnosed with a UTI. Pt states they gave her an antibiotic here and a prescription but she never got it filled and now her symptoms are worse. Pt says now she has some blood in her urine.

## 2018-07-03 NOTE — Discharge Instructions (Addendum)
Dr. Diamantina Providence will see you next week for a cystoscopy.  You can call his office tomorrow to see when appointment is scheduled for.

## 2018-07-03 NOTE — ED Provider Notes (Signed)
[facility] Emergency Department Provider Note  ____________________________________________  Time seen: Approximately 11:52 AM  I have reviewed the triage vital signs and the nursing notes.   HISTORY  Chief Complaint Hematuria; Urinary Frequency; Urinary Urgency; and Dysuria    HPI Angela Russell is a 68 y.o. female that presents to the emergency department for evaluation of dysuria and hematuria for 3 days. Patient feels like she needs to urinate and then when she tries to go, she feels like she cant. There has been gross blood in her urine. She still has some LLQ pain that radiates to the suprapubic region. She was seen in the emergency department 2 days ago and was sent home with a prescription for keflex but prescription went to the wrong pharmacy so she was not able to pick it up. No fevers, nausea, vomiting.    Past Medical History:  Diagnosis Date  . Carotid artery plaque, right 01/2014  . CKD (chronic kidney disease)    stage 2-3  . Diabetes mellitus without complication ([place])   . DM (diabetes mellitus), type 2, uncontrolled ([place])   . Hyperlipidemia   . Hypertension   . Hypochromic microcytic anemia    mild  . Osteoporosis   . Renal insufficiency   . Rotator cuff tendonitis, right     Patient Active Problem List   Diagnosis Date Noted  . Hyperlipidemia   . Hypertension   . DM (diabetes mellitus), type 2, uncontrolled ([place])   . Renal insufficiency   . CKD (chronic kidney disease)   . Hypochromic microcytic anemia   . Carotid artery plaque, right 01/02/2014  . Cervical facet joint syndrome 03/20/2013  . Spondylolisthesis of lumbar region 03/20/2013    Past Surgical History:  Procedure Laterality Date  . ABDOMINAL HYSTERECTOMY  2000   due to bleeding and fibroids, partial- still has ovaries  . KNEE SURGERY Left 03/17/2013   torn meniscus    Prior to Admission medications   Medication Sig Start Date End Date Taking? Authorizing  Provider  amLODipine (NORVASC) 5 MG tablet Take 1 tablet (5 mg total) by mouth daily. 12/06/17   Kathrine Haddock, NP  blood glucose meter kit and supplies Dispense based on patient and insurance preference. Use up to four times daily as directed. (FOR ICD-9 250.00, 250.01). 05/31/17   Kathrine Haddock, NP  cephALEXin (KEFLEX) 500 MG capsule Take 1 capsule (500 mg total) by mouth 2 (two) times daily for 10 days. 07/03/18 07/13/18  Laban Emperor, PA-C  diclofenac sodium (VOLTAREN) 1 % GEL Apply 2 g topically 4 (four) times daily. 04/11/18   Volney American, PA-C  fluconazole (DIFLUCAN) 150 MG tablet Take 1 tablet (150 mg total) by mouth daily. 07/01/18   Merlyn Lot, MD  lisinopril-hydrochlorothiazide (PRINZIDE,ZESTORETIC) 20-12.5 MG tablet Take 1 tablet by mouth 2 (two) times daily. 01/08/17   [provider]  metFORMIN (GLUCOPHAGE) 1000 MG tablet Take 1 tablet (1,000 mg total) by mouth 2 (two) times daily. 04/11/18   Volney American, PA-C  pioglitazone (ACTOS) 15 MG tablet Take 1 tablet (15 mg total) by mouth daily. 04/11/18   Volney American, PA-C  pravastatin (PRAVACHOL) 10 MG tablet Take 1 tablet (10 mg total) by mouth at bedtime. 04/11/18   Volney American, PA-C    Allergies Patient has no known allergies.  Family History  Problem Relation Age of Onset  . Heart disease Mother   . Arthritis Father   . Diabetes Brother  Social History Social History   Tobacco Use  . Smoking status: Former Smoker    Last attempt to quit: 06/05/1991    Years since quitting: 27.0  . Smokeless tobacco: Never Used  Substance Use Topics  . Alcohol use: No  . Drug use: No     Review of Systems  Constitutional: No fever/chills Cardiovascular: No chest pain. Respiratory: No cough. No SOB. Gastrointestinal: No nausea, no vomiting.  Musculoskeletal: Negative for musculoskeletal pain. Skin: Negative for rash, abrasions, lacerations, ecchymosis.     ____________________________________________   PHYSICAL EXAM:  VITAL SIGNS: ED Triage Vitals  Enc Vitals Group     BP 07/03/18 0851 (!) 141/64     Pulse Rate 07/03/18 0850 77     Resp 07/03/18 0850 20     Temp 07/03/18 0850 98 F (36.7 C)     Temp Source 07/03/18 0850 Oral     SpO2 07/03/18 0850 99 %     Weight 07/03/18 0851 214 lb (97.1 kg)     Height 07/03/18 0851 5' 3" (1.6 m)     Head Circumference --      Peak Flow --      Pain Score 07/03/18 0851 7     Pain Loc --      Pain Edu? --      Excl. in [place]? --      Constitutional: Alert and oriented. Well appearing and in no acute distress. Eyes: Conjunctivae are normal. PERRL. EOMI. Head: Atraumatic. ENT:      Ears:      Nose: No congestion/rhinnorhea.      Mouth/Throat: Mucous membranes are moist.  Neck: No stridor.  Cardiovascular: Normal rate, regular rhythm.  Good peripheral circulation. Respiratory: Normal respiratory effort without tachypnea or retractions. Lungs CTAB. Good air entry to the bases with no decreased or absent breath sounds. Gastrointestinal: Bowel sounds 4 quadrants. Soft and nontender to palpation. No guarding or rigidity. No palpable masses. No distention. No CVA tenderness. Musculoskeletal: Full range of motion to all extremities. No gross deformities appreciated. Neurologic:  Normal speech and language. No gross focal neurologic deficits are appreciated.  Skin:  Skin is warm, dry and intact. No rash noted. Psychiatric: Mood and affect are normal. Speech and behavior are normal. Patient exhibits appropriate insight and judgement.   ____________________________________________   LABS (all labs ordered are listed, but only abnormal results are displayed)  Labs Reviewed  URINALYSIS, COMPLETE (UACMP) WITH MICROSCOPIC - Abnormal; Notable for the following components:      Result Value   Color, Urine RED (*)    APPearance TURBID (*)    Glucose, UA   (*)    Value: TEST NOT REPORTED DUE TO  COLOR INTERFERENCE OF URINE PIGMENT   Hgb urine dipstick   (*)    Value: TEST NOT REPORTED DUE TO COLOR INTERFERENCE OF URINE PIGMENT   Bilirubin Urine   (*)    Value: TEST NOT REPORTED DUE TO COLOR INTERFERENCE OF URINE PIGMENT   Ketones, ur   (*)    Value: TEST NOT REPORTED DUE TO COLOR INTERFERENCE OF URINE PIGMENT   Protein, ur   (*)    Value: TEST NOT REPORTED DUE TO COLOR INTERFERENCE OF URINE PIGMENT   Nitrite   (*)    Value: TEST NOT REPORTED DUE TO COLOR INTERFERENCE OF URINE PIGMENT   Leukocytes, UA   (*)    Value: TEST NOT REPORTED DUE TO COLOR INTERFERENCE OF URINE PIGMENT   RBC / HPF >  50 (*)    All other components within normal limits  CBC - Abnormal; Notable for the following components:   Hemoglobin 10.2 (*)    HCT 33.2 (*)    MCV 77.9 (*)    MCH 23.9 (*)    All other components within normal limits  BASIC METABOLIC PANEL - Abnormal; Notable for the following components:   Glucose, Bld 114 (*)    BUN 24 (*)    GFR calc non Af Amer 59 (*)    All other components within normal limits   ____________________________________________  EKG   ____________________________________________  RADIOLOGY I,  , personally viewed and evaluated these images (plain radiographs) as part of my medical decision making, as well as reviewing the written report by the radiologist.  Ct Abdomen Pelvis W Contrast  Result Date: 07/03/2018 CLINICAL DATA:  Gross hematuria and pelvic pain EXAM: CT ABDOMEN AND PELVIS WITH CONTRAST TECHNIQUE: Multidetector CT imaging of the abdomen and pelvis was performed using the standard protocol following bolus administration of intravenous contrast. CONTRAST:  100mL ISOVUE-300 IOPAMIDOL (ISOVUE-300) INJECTION 61% COMPARISON:  July 01, 2018 FINDINGS: Lower chest: No acute abnormality. Hepatobiliary: Mild diffuse low density of the liver is identified. No focal liver lesion is noted. The gallbladder is normal. The biliary tree is normal.  Pancreas: Unremarkable. No pancreatic ductal dilatation or surrounding inflammatory changes. Spleen: Normal in size without focal abnormality. Adrenals/Urinary Tract: The bilateral adrenal glands are normal. No kidney stones are identified bilaterally. There is no hydronephrosis bilaterally. The bladder is decompressed limiting evaluation. Stomach/Bowel: Stomach is within normal limits. Appendix appears normal. No evidence of bowel wall thickening, distention, or inflammatory changes. Vascular/Lymphatic: Aortic atherosclerosis. No enlarged abdominal or pelvic lymph nodes. Reproductive: Status post hysterectomy. No adnexal masses. Other: No abdominal wall hernia or abnormality. No abdominopelvic ascites. Musculoskeletal: Degenerative joint changes of the spine are noted. IMPRESSION: No nephrolithiasis or hydronephrosis is identified bilaterally. The bladder is decompressed, limiting evaluation. No acute abnormality identified in the abdomen and pelvis. Electronically Signed   By: Wei-Chen  Lin M.D.   On: 07/03/2018 13:55    ____________________________________________    PROCEDURES  Procedure(s) performed:    Procedures    Medications  iopamidol (ISOVUE-300) 61 % injection 100 mL (100 mLs Intravenous Contrast Given 07/03/18 1332)  cefTRIAXone (ROCEPHIN) 1 g in sodium chloride 0.9 % 100 mL IVPB (0 g Intravenous Stopped 07/03/18 1533)  sodium chloride 0.9 % bolus 1,000 mL (0 mLs Intravenous Stopped 07/03/18 1533)     ____________________________________________   INITIAL IMPRESSION / ASSESSMENT AND PLAN / ED COURSE  Pertinent labs & imaging results that were available during my care of the patient were reviewed by me and considered in my medical decision making (see chart for details).  Review of the [place] CSRS was performed in accordance of the [facility] prior to dispensing any controlled drugs.  Patient presented to the emergency department for evaluation of hematuria.  Vital signs and exam are  reassuring.  CBC and CMP similar to 2 days prior.  Hemoglobin and hematocrit are slightly decreased and hemoglobin is 10.2 down from 10.8 and hematocrit is 35.2 down from 35.6.  Urinalysis shows gross hematuria.  No indication of bacterial infection.  Urine culture that was completed 2 days ago showed no growth.  CT scan is negative for acute abnormalities.  Dr. Sninsky was consulted and will have her scheduled for a cytoscopy next week. Patient will be discharged home with prescriptions for keflex. Patient is to follow up with urology   as directed. Patient is given ED precautions to return to the ED for any worsening or new symptoms.     ____________________________________________  FINAL CLINICAL IMPRESSION(S) / ED DIAGNOSES  Final diagnoses:  Cystitis  Gross hematuria      NEW MEDICATIONS STARTED DURING THIS VISIT:  ED Discharge Orders         Ordered    cephALEXin (KEFLEX) 500 MG capsule  2 times daily     07/03/18 1527              This chart was dictated using voice recognition software/Dragon. Despite best efforts to proofread, errors can occur which can change the meaning. Any change was purely unintentional.    Laban Emperor, PA-C 07/03/18 1730    Nena Polio, MD 07/04/18 [phone]

## 2018-07-15 ENCOUNTER — Ambulatory Visit (INDEPENDENT_AMBULATORY_CARE_PROVIDER_SITE_OTHER): Payer: Medicare Other | Admitting: Urology

## 2018-07-15 ENCOUNTER — Other Ambulatory Visit: Payer: Self-pay | Admitting: Radiology

## 2018-07-15 ENCOUNTER — Ambulatory Visit: Payer: Medicare Other | Admitting: Urology

## 2018-07-15 ENCOUNTER — Encounter: Payer: Self-pay | Admitting: Urology

## 2018-07-15 ENCOUNTER — Telehealth: Payer: Self-pay | Admitting: Radiology

## 2018-07-15 VITALS — BP 127/66 | HR 109 | Ht 63.0 in | Wt 214.0 lb

## 2018-07-15 DIAGNOSIS — D494 Neoplasm of unspecified behavior of bladder: Secondary | ICD-10-CM

## 2018-07-15 DIAGNOSIS — R319 Hematuria, unspecified: Secondary | ICD-10-CM | POA: Diagnosis not present

## 2018-07-15 LAB — URINALYSIS, COMPLETE
Bilirubin, UA: NEGATIVE
Glucose, UA: NEGATIVE
Ketones, UA: NEGATIVE
Leukocytes, UA: NEGATIVE
Nitrite, UA: NEGATIVE
Specific Gravity, UA: 1.015 (ref 1.005–1.030)
Urobilinogen, Ur: 0.2 mg/dL (ref 0.2–1.0)
pH, UA: 7 (ref 5.0–7.5)

## 2018-07-15 LAB — MICROSCOPIC EXAMINATION: RBC, UA: >30 /hpf — ABNORMAL HIGH (ref 0–2)

## 2018-07-15 MED ORDER — CEFAZOLIN SODIUM-DEXTROSE 2-4 GM/100ML-% IV SOLN
2.0000 g | INTRAVENOUS | Status: AC
  Administered 2018-07-16: 2 g via INTRAVENOUS

## 2018-07-15 NOTE — Telephone Encounter (Signed)
Patient was given the [place] form below as well as a map of [facility].   [address] [place], [place] [address] [place], [place] [address] Telephone: [phone] Fax: [phone]   Thank you for choosing [facility] for your upcoming surgery!  We are always here to assist in your urological needs.  Please read the following information with specific details for your upcoming appointments related to your surgery. Please contact Maresha Anastos at [phone] Option 3 with any questions.  The Name of Your Surgery: Transurethral resection of bladder tumor Your Surgery Date: 07/16/2018 with arrival at 12:30 pm at [facility]. You will then go to Same Day Surgery which is located on the second floor of the [facility]. Your Surgeon: Nickolas Madrid  Patient was advised to have nothing to eat or drink after midnight the night prior to surgery except that she may have only water or plain, clear apple juice if needed for low blood glucose levels until 2 hours before surgery with nothing to drink within 2 hours of surgery. She was advised to take only amlodipine the morning of surgery.  The patient currently takes metformin & was informed to hold medication beginning immediately. She will need to ask Dr Diamantina Providence when to resume medication after surgery. Patient's questions were answered and she expressed understanding of these instructions.

## 2018-07-15 NOTE — Progress Notes (Signed)
07/15/2018 2:06 PM   Angela Russell 06/11/1950 [id number]  Referring provider: Volney American, PA-C [place], [place] [address]  CC: Gross hematuria  HPI: I saw Angela Russell today in urology clinic for gross hematuria.  She is a 68 year old African-American female with history of type 2 diabetes and hypertension who reports 2 weeks of intermittent gross hematuria with dime size clots.  She was seen in the emergency department on 07/01/2018 and 07/03/2018 with gross hematuria.  A CT stone protocol study was done on the first visit that suggested a possible posterior bladder mass but no stones or hydronephrosis.  Urine culture was negative.  She underwent a repeat CT scan with contrast at the second visit that showed no lymphadenopathy or enhancing renal masses.  She denies any dysuria, urgency, frequency, or history of urinary tract infections.  She has a 25 pack-year smoking history, and quit approximately 20 years ago.  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  There is no family history of any urologic malignancies.  Past surgical history is notable for a hysterectomy for fibroids.  There are no aggravating or alleviating factors.  Severity is moderate.  She denies any chest pain or shortness of breath.  Denies weight loss.   PMH: Past Medical History:  Diagnosis Date  . Carotid artery plaque, right 01/2014  . CKD (chronic kidney disease)    stage 2-3  . Diabetes mellitus without complication ([place])   . DM (diabetes mellitus), type 2, uncontrolled ([place])   . Hyperlipidemia   . Hypertension   . Hypochromic microcytic anemia    mild  . Osteoporosis   . Renal insufficiency   . Rotator cuff tendonitis, right     Surgical History: Past Surgical History:  Procedure Laterality Date  . ABDOMINAL HYSTERECTOMY  2000   due to bleeding and fibroids, partial- still has ovaries  . KNEE SURGERY Left 03/17/2013   torn meniscus    Allergies:  No Known Allergies  Family History: Family History  Problem Relation Age of Onset  . Heart disease Mother   . Arthritis Father   . Diabetes Brother     Social History:  reports that she quit smoking about 27 years ago. She has never used smokeless tobacco. She reports that she does not drink alcohol or use drugs.  ROS: Please see flowsheet from today's date for complete review of systems.  Physical Exam: BP 127/66   Pulse (!) 109   Ht 5' 3"  (1.6 m)   Wt 214 lb (97.1 kg)   BMI 37.91 kg/m    Constitutional:  Alert and oriented, No acute distress. Cardiovascular: Regular rate and rhythm Respiratory: Clear to auscultation bilaterally GI: Abdomen is soft, nontender, nondistended, no abdominal masses GU: No CVA tenderness Lymph: No cervical or inguinal lymphadenopathy. Skin: No rashes, bruises or suspicious lesions. Neurologic: Grossly intact, no focal deficits, moving all 4 extremities. Psychiatric: Normal mood and affect.  Laboratory Data: Reviewed Creatinine 0.99, EGFR greater than 60 Urine culture 07/01/2018 no growth  Pertinent Imaging: I have personally reviewed both CT scans from 1/28 and 1/30.  There is an irregular posterior bladder mass best seen on the non-contrast film.  No evidence of lymphadenopathy or metastatic disease.  No delayed films.  Cystoscopy Procedure Note:  Indication: Gross hematuria  After informed consent and discussion of the procedure and its risks, Angela Russell was positioned and prepped in the standard fashion. Cystoscopy was performed with a  flexible cystoscope. The urethra, bladder neck and entire bladder was visualized in a standard fashion.  Visualization was limited secondary to bloody urine, however there appeared to be a moderate sized papillary bladder tumor at the posterior wall.   Assessment & Plan:   In summary, Angela Russell is a 68 year old female with 2 weeks of gross hematuria, and moderate sized bladder tumor on CT scan and  confirmed with clinic cystoscopy today.  We discussed transurethral resection of bladder tumor (TURBT) and risks and benefits at length. This is typically a 1 to 2-hour procedure done under general anesthesia in the operating room.  A scope is inserted through the urethra and used to resect abnormal tissue within the bladder, which is then sent to the pathologist to determine grade and stage of the tumor.  Risks include bleeding, infection, need for temporary Foley placement, and bladder perforation.  Treatment strategies are based on the type of tumor and depth of invasion.  We reviewed the different treatment pathways for non-muscle invasive and muscle invasive bladder cancer.  TURBT with bilateral retrograde pyelograms, will schedule for tomorrow 07/16/2018 CXR to complete [place], MD  [facility] [address] [place], [place] [address] [phone]

## 2018-07-15 NOTE — Patient Instructions (Signed)
Transurethral Resection of Bladder Tumor, Care After This sheet gives you information about how to care for yourself after your procedure. Your health care provider may also give you more specific instructions. If you have problems or questions, contact your health care provider. What can I expect after the procedure? After the procedure, it is common to have:  A small amount of blood in your urine for up to 2 weeks.  Soreness or mild pain from your catheter. After your catheter is removed, you may have mild soreness, especially when urinating.  Pain in your lower abdomen. Follow these instructions at home: Medicines   Take over-the-counter and prescription medicines only as told by your health care provider.  If you were prescribed an antibiotic medicine, take it as told by your health care provider. Do not stop taking the antibiotic even if you start to feel better.  Do not drive for 24 hours if you were given a sedative during your procedure.  Ask your health care provider if the medicine prescribed to you: ? Requires you to avoid driving or using heavy machinery. ? Can cause constipation. You may need to take these actions to prevent or treat constipation:  Take over-the-counter or prescription medicines.  Eat foods that are high in fiber, such as beans, whole grains, and fresh fruits and vegetables.  Limit foods that are high in fat and processed sugars, such as fried or sweet foods. Activity  Return to your normal activities as told by your health care provider. Ask your health care provider what activities are safe for you.  Do not lift anything that is heavier than 10 lb (4.5 kg), or the limit that you are told, until your health care provider says that it is safe.  Avoid intense physical activity for as long as told by your health care provider.  Rest as told by your health care provider.  Avoid sitting for a long time without moving. Get up to take short walks every  1-2 hours. This is important to improve blood flow and breathing. Ask for help if you feel weak or unsteady. General instructions   Do not drink alcohol for as long as told by your health care provider. This is especially important if you are taking prescription pain medicines.  Do not take baths, swim, or use a hot tub until your health care provider approves. Ask your health care provider if you may take showers. You may only be allowed to take sponge baths.  If you have a catheter, follow instructions from your health care provider about caring for your catheter and your drainage bag.  Drink enough fluid to keep your urine pale yellow.  Wear compression stockings as told by your health care provider. These stockings help to prevent blood clots and reduce swelling in your legs.  Keep all follow-up visits as told by your health care provider. This is important. ? You will need to be followed closely with regular checks of your bladder and urethra (cystoscopies) to make sure that the cancer does not come back. Contact a health care provider if:  You have pain that gets worse or does not improve with medicine.  You have blood in your urine for more than 2 weeks.  You have cloudy or bad-smelling urine.  You become constipated. Signs of constipation may include having: ? Fewer than three bowel movements in a week. ? Difficulty having a bowel movement. ? Stools that are dry, hard, or larger than normal.  You have  a fever. Get help right away if:  You have: ? Severe pain. ? Bright red blood in your urine. ? Blood clots in your urine. ? A lot of blood in your urine.  Your catheter has been removed and you are not able to urinate.  You have a catheter in place and the catheter is not draining urine. Summary  After your procedure, it is common to have a small amount of blood in your urine, soreness or mild pain from your catheter, and pain in your lower abdomen.  Take  over-the-counter and prescription medicines only as told by your health care provider.  Rest as told by your health care provider. Follow your health care provider's instructions about returning to normal activities. Ask what activities are safe for you.  If you have a catheter, follow instructions from your health care provider about caring for your catheter and your drainage bag.  Get help right away if you cannot urinate, you have severe pain, or you have bright red blood or blood clots in your urine. This information is not intended to replace advice given to you by your health care provider. Make sure you discuss any questions you have with your health care provider. Document Released: 05/02/2015 Document Revised: 12/19/2017 Document Reviewed: 12/19/2017 Elsevier Interactive Patient Education  2019 [employer] Transurethral Resection of Bladder Tumor  Transurethral resection of a bladder tumor is the removal (resection) of a cancerous growth (tumor) on the inside wall of the bladder. The bladder is the organ that holds urine. The tumor is removed through the tube that carries urine out of the body (urethra). In a transurethral resection, a thin telescope with a light, a tiny camera, and an electric cutting edge (resectoscope) is passed through the urethra. In men, the opening of the urethra is at the end of the penis. In women, it is just above the opening of the vagina. Tell a health care provider about:  Any allergies you have.  All medicines you are taking, including vitamins, herbs, eye drops, creams, and over-the-counter medicines.  Any problems you or family members have had with anesthetic medicines.  Any blood disorders you have.  Any surgeries you have had.  Any medical conditions you have.  Any recent urinary tract infections you have had.  Whether you are pregnant or may be pregnant. What are the risks? Generally, this is a safe procedure. However, problems may  occur, including:  Infection.  Bleeding.  Allergic reactions to medicines.  Damage to nearby structures or organs, such as: ? The urethra. ? The tubes that drain urine from the kidneys into the bladder (ureters).  Pain and burning during urination.  Difficulty urinating due to partial blockage of the urethra.  Inability to urinate (urinary retention). What happens before the procedure? Staying hydrated Follow instructions from your health care provider about hydration, which may include:  Up to 2 hours before the procedure - you may continue to drink clear liquids, such as water, clear fruit juice, black coffee, and plain tea.  Eating and drinking restrictions Follow instructions from your health care provider about eating and drinking, which may include:  8 hours before the procedure - stop eating heavy meals or foods, such as meat, fried foods, or fatty foods.  6 hours before the procedure - stop eating light meals or foods, such as toast or cereal.  6 hours before the procedure - stop drinking milk or drinks that contain milk.  2 hours before the procedure - stop  drinking clear liquids. Medicines Ask your health care provider about:  Changing or stopping your regular medicines. This is especially important if you are taking diabetes medicines or blood thinners.  Taking medicines such as aspirin and ibuprofen. These medicines can thin your blood. Do not take these medicines unless your health care provider tells you to take them.  Taking over-the-counter medicines, vitamins, herbs, and supplements. Tests You may have exams or tests, including:  Physical exam.  Blood tests.  Urine tests.  Electrocardiogram (ECG). This test measures the electrical activity of the heart. General instructions  Plan to have someone take you home from the hospital or clinic.  Ask your health care provider how your surgical site will be marked or identified.  Ask your health care  provider what steps will be taken to help prevent infection. These may include: ? Washing skin with a germ-killing soap. ? Taking antibiotic medicine. What happens during the procedure?  An IV will be inserted into one of your veins.  You will be given one or more of the following: ? A medicine to help you relax (sedative). ? A medicine to make you fall asleep (general anesthetic). ? A medicine that is injected into your spine to numb the area below and slightly above the injection site (spinal anesthetic).  Your legs will be placed in foot rests (stirrups) so that your legs are apart and your knees are bent.  The resectoscope will be passed through your urethra and into your bladder.  The part of your bladder that is affected by the tumor will be resected using the cutting edge of the resectoscope.  The resectoscope will be removed.  A thin, flexible tube (catheter) will be passed through your urethra and into your bladder. The catheter will drain urine into a bag outside of your body. ? Fluid may be passed through the catheter to keep the catheter open. The procedure may vary among health care providers and hospitals. What happens after the procedure?  Your blood pressure, heart rate, breathing rate, and blood oxygen level will be monitored until you leave the hospital or clinic.  You may continue to receive fluids and medicines through an IV.  You will have some pain. You will be given pain medicine to relieve pain.  You will have a catheter to drain your urine. ? You will have blood in your urine. Your catheter may be kept in until your urine is clear. ? The amount of urine will be monitored. If necessary, your bladder may be rinsed out (irrigated) by passing fluid through your catheter.  You will be encouraged to walk around as soon as possible.  You may have to wear compression stockings. These stockings help to prevent blood clots and reduce swelling in your legs.  Do  not drive for 24 hours if you were given a sedative during your procedure. Summary  Transurethral resection of a bladder tumor is the removal (resection) of a cancerous growth (tumor) on the inside wall of the bladder.  To do this procedure, your health care provider uses a thin telescope with a light, a tiny camera, and an electric cutting edge (resectoscope).  Follow your health care provider's instructions. You may need to stop or change certain medicines, and you may be told to stop eating and drinking several hours before the procedure.  Your blood pressure, heart rate, breathing rate, and blood oxygen level will be monitored until you leave the hospital or clinic.  You may have to wear  compression stockings. These stockings help to prevent blood clots and reduce swelling in your legs. This information is not intended to replace advice given to you by your health care provider. Make sure you discuss any questions you have with your health care provider. Document Released: 03/17/2009 Document Revised: 12/20/2017 Document Reviewed: 12/20/2017 Elsevier Interactive Patient Education  2019 [employer].

## 2018-07-16 ENCOUNTER — Observation Stay
Admission: RE | Admit: 2018-07-16 | Discharge: 2018-07-17 | Disposition: A | Discharge status: Home / Self Care | Payer: Medicare Other | Attending: Urology | Admitting: Urology

## 2018-07-16 ENCOUNTER — Ambulatory Visit: Payer: Medicare Other | Admitting: Certified Registered Nurse Anesthetist

## 2018-07-16 ENCOUNTER — Encounter
Admission: RE | Disposition: A | Discharge status: Home / Self Care | Payer: Self-pay | Source: Home / Self Care | Attending: Urology

## 2018-07-16 ENCOUNTER — Other Ambulatory Visit: Payer: Self-pay

## 2018-07-16 ENCOUNTER — Encounter: Payer: Self-pay | Admitting: *Deleted

## 2018-07-16 ENCOUNTER — Ambulatory Visit: Payer: Medicare Other

## 2018-07-16 DIAGNOSIS — Z79899 Other long term (current) drug therapy: Secondary | ICD-10-CM | POA: Insufficient documentation

## 2018-07-16 DIAGNOSIS — Z87891 Personal history of nicotine dependence: Secondary | ICD-10-CM | POA: Insufficient documentation

## 2018-07-16 DIAGNOSIS — D631 Anemia in chronic kidney disease: Secondary | ICD-10-CM | POA: Diagnosis not present

## 2018-07-16 DIAGNOSIS — E118 Type 2 diabetes mellitus with unspecified complications: Secondary | ICD-10-CM | POA: Insufficient documentation

## 2018-07-16 DIAGNOSIS — Z7984 Long term (current) use of oral hypoglycemic drugs: Secondary | ICD-10-CM | POA: Diagnosis not present

## 2018-07-16 DIAGNOSIS — M810 Age-related osteoporosis without current pathological fracture: Secondary | ICD-10-CM | POA: Insufficient documentation

## 2018-07-16 DIAGNOSIS — I129 Hypertensive chronic kidney disease with stage 1 through stage 4 chronic kidney disease, or unspecified chronic kidney disease: Secondary | ICD-10-CM | POA: Diagnosis not present

## 2018-07-16 DIAGNOSIS — E1122 Type 2 diabetes mellitus with diabetic chronic kidney disease: Secondary | ICD-10-CM | POA: Insufficient documentation

## 2018-07-16 DIAGNOSIS — D649 Anemia, unspecified: Secondary | ICD-10-CM | POA: Diagnosis not present

## 2018-07-16 DIAGNOSIS — D494 Neoplasm of unspecified behavior of bladder: Secondary | ICD-10-CM | POA: Diagnosis present

## 2018-07-16 DIAGNOSIS — C679 Malignant neoplasm of bladder, unspecified: Secondary | ICD-10-CM | POA: Diagnosis not present

## 2018-07-16 DIAGNOSIS — N183 Chronic kidney disease, stage 3 (moderate): Secondary | ICD-10-CM | POA: Diagnosis not present

## 2018-07-16 DIAGNOSIS — N189 Chronic kidney disease, unspecified: Secondary | ICD-10-CM | POA: Insufficient documentation

## 2018-07-16 DIAGNOSIS — Z01818 Encounter for other preprocedural examination: Secondary | ICD-10-CM

## 2018-07-16 DIAGNOSIS — E785 Hyperlipidemia, unspecified: Secondary | ICD-10-CM | POA: Diagnosis not present

## 2018-07-16 DIAGNOSIS — F17200 Nicotine dependence, unspecified, uncomplicated: Secondary | ICD-10-CM

## 2018-07-16 DIAGNOSIS — Z01811 Encounter for preprocedural respiratory examination: Secondary | ICD-10-CM | POA: Diagnosis not present

## 2018-07-16 HISTORY — PX: TRANSURETHRAL RESECTION OF BLADDER TUMOR: SHX2575

## 2018-07-16 HISTORY — PX: CYSTOSCOPY W/ RETROGRADES: SHX1426

## 2018-07-16 LAB — CBC
HCT: 29.7 % — ABNORMAL LOW (ref 36.0–46.0)
Hemoglobin: 8.9 g/dL — ABNORMAL LOW (ref 12.0–15.0)
MCH: 23.5 pg — ABNORMAL LOW (ref 26.0–34.0)
MCHC: 30 g/dL (ref 30.0–36.0)
MCV: 78.4 fL — ABNORMAL LOW (ref 80.0–100.0)
Platelets: 345 10*3/uL (ref 150–400)
RBC: 3.79 MIL/uL — ABNORMAL LOW (ref 3.87–5.11)
RDW: 14.3 % (ref 11.5–15.5)
WBC: 5.8 10*3/uL (ref 4.0–10.5)
nRBC: 0 % (ref 0.0–0.2)

## 2018-07-16 LAB — GLUCOSE, CAPILLARY
Glucose-Capillary: 102 mg/dL — ABNORMAL HIGH (ref 70–99)
Glucose-Capillary: 92 mg/dL (ref 70–99)

## 2018-07-16 LAB — CREATININE, SERUM
Creatinine, Ser: 1.03 mg/dL — ABNORMAL HIGH (ref 0.44–1.00)
GFR calc Af Amer: >60 mL/min (ref >60)
GFR calc non Af Amer: 56 mL/min — ABNORMAL LOW (ref >60)

## 2018-07-16 SURGERY — TURBT (TRANSURETHRAL RESECTION OF BLADDER TUMOR)
Anesthesia: General | Site: Ureter

## 2018-07-16 MED ORDER — CEFAZOLIN SODIUM-DEXTROSE 2-4 GM/100ML-% IV SOLN
INTRAVENOUS | Status: AC
  Filled 2018-07-16: qty 100

## 2018-07-16 MED ORDER — LISINOPRIL 20 MG PO TABS
20.0000 mg | ORAL_TABLET | Freq: Two times a day (BID) | ORAL | Status: DC
  Administered 2018-07-16 – 2018-07-17 (×2): 20 mg via ORAL
  Filled 2018-07-16 (×2): qty 1

## 2018-07-16 MED ORDER — HYDROCODONE-ACETAMINOPHEN 5-325 MG PO TABS
1.0000 | ORAL_TABLET | ORAL | Status: DC | PRN
  Administered 2018-07-16: 2 via ORAL
  Administered 2018-07-16: 1 via ORAL
  Filled 2018-07-16: qty 1
  Filled 2018-07-16: qty 2

## 2018-07-16 MED ORDER — IOTHALAMATE MEGLUMINE 43 % IV SOLN
INTRAVENOUS | Status: DC | PRN
  Administered 2018-07-16: 30 mL via URETHRAL

## 2018-07-16 MED ORDER — MIDAZOLAM HCL 2 MG/2ML IJ SOLN
INTRAMUSCULAR | Status: AC
  Filled 2018-07-16: qty 2

## 2018-07-16 MED ORDER — ONDANSETRON HCL 4 MG/2ML IJ SOLN: 4.0000 mg | Freq: Once | INTRAMUSCULAR | Status: DC | PRN

## 2018-07-16 MED ORDER — FENTANYL CITRATE (PF) 100 MCG/2ML IJ SOLN
INTRAMUSCULAR | Status: AC
  Filled 2018-07-16: qty 2

## 2018-07-16 MED ORDER — LISINOPRIL-HYDROCHLOROTHIAZIDE 20-12.5 MG PO TABS: 1.0000 | ORAL_TABLET | Freq: Two times a day (BID) | ORAL | Status: DC

## 2018-07-16 MED ORDER — HEPARIN SODIUM (PORCINE) 5000 UNIT/ML IJ SOLN
5000.0000 [IU] | Freq: Three times a day (TID) | INTRAMUSCULAR | Status: DC
  Administered 2018-07-16 – 2018-07-17 (×2): 5000 [IU] via SUBCUTANEOUS
  Filled 2018-07-16 (×2): qty 1

## 2018-07-16 MED ORDER — DEXAMETHASONE SODIUM PHOSPHATE 10 MG/ML IJ SOLN
INTRAMUSCULAR | Status: DC | PRN
  Administered 2018-07-16: 5 mg via INTRAVENOUS

## 2018-07-16 MED ORDER — FENTANYL CITRATE (PF) 100 MCG/2ML IJ SOLN
25.0000 ug | INTRAMUSCULAR | Status: AC | PRN
  Administered 2018-07-16 (×2): 25 ug via INTRAVENOUS

## 2018-07-16 MED ORDER — FAMOTIDINE 20 MG PO TABS
ORAL_TABLET | ORAL | Status: AC
  Administered 2018-07-16: 20 mg via ORAL
  Filled 2018-07-16: qty 1

## 2018-07-16 MED ORDER — ONDANSETRON HCL 4 MG/2ML IJ SOLN
INTRAMUSCULAR | Status: AC
  Filled 2018-07-16: qty 2

## 2018-07-16 MED ORDER — METFORMIN HCL 500 MG PO TABS
1000.0000 mg | ORAL_TABLET | Freq: Two times a day (BID) | ORAL | Status: DC
  Administered 2018-07-16 – 2018-07-17 (×2): 1000 mg via ORAL
  Filled 2018-07-16 (×2): qty 2

## 2018-07-16 MED ORDER — FENTANYL CITRATE (PF) 100 MCG/2ML IJ SOLN
INTRAMUSCULAR | Status: DC | PRN
  Administered 2018-07-16 (×4): 50 ug via INTRAVENOUS

## 2018-07-16 MED ORDER — PROPOFOL 10 MG/ML IV BOLUS
INTRAVENOUS | Status: DC | PRN
  Administered 2018-07-16: 120 mg via INTRAVENOUS

## 2018-07-16 MED ORDER — MIDAZOLAM HCL 2 MG/2ML IJ SOLN
INTRAMUSCULAR | Status: DC | PRN
  Administered 2018-07-16: 2 mg via INTRAVENOUS

## 2018-07-16 MED ORDER — PRAVASTATIN SODIUM 20 MG PO TABS
10.0000 mg | ORAL_TABLET | Freq: Every day | ORAL | Status: DC
  Administered 2018-07-16: 20 mg via ORAL
  Filled 2018-07-16: qty 1

## 2018-07-16 MED ORDER — ONDANSETRON HCL 4 MG/2ML IJ SOLN
INTRAMUSCULAR | Status: DC | PRN
  Administered 2018-07-16: 4 mg via INTRAVENOUS

## 2018-07-16 MED ORDER — HYDROMORPHONE HCL 1 MG/ML IJ SOLN: 0.5000 mg | INTRAMUSCULAR | Status: DC | PRN

## 2018-07-16 MED ORDER — AMLODIPINE BESYLATE 5 MG PO TABS
5.0000 mg | ORAL_TABLET | Freq: Every day | ORAL | Status: DC
  Administered 2018-07-17: 5 mg via ORAL
  Filled 2018-07-16: qty 1

## 2018-07-16 MED ORDER — FENTANYL CITRATE (PF) 100 MCG/2ML IJ SOLN
INTRAMUSCULAR | Status: AC
  Administered 2018-07-16: 50 ug via INTRAVENOUS
  Filled 2018-07-16: qty 2

## 2018-07-16 MED ORDER — SUGAMMADEX SODIUM 200 MG/2ML IV SOLN
INTRAVENOUS | Status: AC
  Filled 2018-07-16: qty 2

## 2018-07-16 MED ORDER — BELLADONNA ALKALOIDS-OPIUM 16.2-60 MG RE SUPP: 1.0000 | Freq: Four times a day (QID) | RECTAL | Status: DC | PRN

## 2018-07-16 MED ORDER — PHENYLEPHRINE HCL 10 MG/ML IJ SOLN
INTRAMUSCULAR | Status: DC | PRN
  Administered 2018-07-16: 50 ug via INTRAVENOUS
  Administered 2018-07-16: 100 ug via INTRAVENOUS

## 2018-07-16 MED ORDER — PIOGLITAZONE HCL 15 MG PO TABS
15.0000 mg | ORAL_TABLET | Freq: Every day | ORAL | Status: DC
  Administered 2018-07-17: 15 mg via ORAL
  Filled 2018-07-16: qty 1

## 2018-07-16 MED ORDER — FAMOTIDINE 20 MG PO TABS
20.0000 mg | ORAL_TABLET | Freq: Once | ORAL | Status: AC
  Administered 2018-07-16: 20 mg via ORAL

## 2018-07-16 MED ORDER — SUCCINYLCHOLINE CHLORIDE 20 MG/ML IJ SOLN
INTRAMUSCULAR | Status: DC | PRN
  Administered 2018-07-16: 100 mg via INTRAVENOUS

## 2018-07-16 MED ORDER — SUGAMMADEX SODIUM 200 MG/2ML IV SOLN
INTRAVENOUS | Status: DC | PRN
  Administered 2018-07-16: 200 mg via INTRAVENOUS

## 2018-07-16 MED ORDER — HYDROCHLOROTHIAZIDE 12.5 MG PO CAPS
12.5000 mg | ORAL_CAPSULE | Freq: Two times a day (BID) | ORAL | Status: DC
  Administered 2018-07-17: 12.5 mg via ORAL
  Filled 2018-07-16 (×2): qty 1

## 2018-07-16 MED ORDER — OXYBUTYNIN CHLORIDE ER 5 MG PO TB24
10.0000 mg | ORAL_TABLET | Freq: Every day | ORAL | Status: DC
  Administered 2018-07-16 – 2018-07-17 (×2): 10 mg via ORAL
  Filled 2018-07-16 (×2): qty 2

## 2018-07-16 MED ORDER — DEXAMETHASONE SODIUM PHOSPHATE 10 MG/ML IJ SOLN
INTRAMUSCULAR | Status: AC
  Filled 2018-07-16: qty 1

## 2018-07-16 MED ORDER — LIDOCAINE HCL 4 % EX SOLN
CUTANEOUS | Status: DC | PRN
  Administered 2018-07-16: 5 mL via TOPICAL

## 2018-07-16 MED ORDER — ONDANSETRON HCL 4 MG/2ML IJ SOLN: 4.0000 mg | INTRAMUSCULAR | Status: DC | PRN

## 2018-07-16 MED ORDER — LIDOCAINE HCL (CARDIAC) PF 100 MG/5ML IV SOSY
PREFILLED_SYRINGE | INTRAVENOUS | Status: DC | PRN
  Administered 2018-07-16: 60 mg via INTRAVENOUS

## 2018-07-16 MED ORDER — ACETAMINOPHEN 325 MG PO TABS: 650.0000 mg | ORAL_TABLET | ORAL | Status: DC | PRN

## 2018-07-16 MED ORDER — SODIUM CHLORIDE 0.9 % IV SOLN
INTRAVENOUS | Status: DC
  Administered 2018-07-16: 13:00:00 via INTRAVENOUS

## 2018-07-16 MED ORDER — FENTANYL CITRATE (PF) 100 MCG/2ML IJ SOLN
25.0000 ug | INTRAMUSCULAR | Status: DC | PRN
  Administered 2018-07-16 (×3): 50 ug via INTRAVENOUS

## 2018-07-16 MED ORDER — ROCURONIUM BROMIDE 100 MG/10ML IV SOLN
INTRAVENOUS | Status: DC | PRN
  Administered 2018-07-16: 10 mg via INTRAVENOUS
  Administered 2018-07-16: 30 mg via INTRAVENOUS
  Administered 2018-07-16: 10 mg via INTRAVENOUS

## 2018-07-16 SURGICAL SUPPLY — 39 items
BAG DRAIN CYSTO-URO LG1000N (MISCELLANEOUS) ×3 IMPLANT
BAG URINE DRAINAGE (UROLOGICAL SUPPLIES) ×3 IMPLANT
BRUSH SCRUB EZ  4% CHG (MISCELLANEOUS) ×1
BRUSH SCRUB EZ 4% CHG (MISCELLANEOUS) ×2 IMPLANT
CATH FOL 2WAY LX 20X30 (CATHETERS) ×3 IMPLANT
CATH FOLEY 2WAY  5CC 16FR (CATHETERS) ×1
CATH URETL 5X70 OPEN END (CATHETERS) ×3 IMPLANT
CATH URTH 16FR FL 2W BLN LF (CATHETERS) ×2 IMPLANT
CONRAY 43 FOR UROLOGY 50M (MISCELLANEOUS) ×3 IMPLANT
CRADLE LAMINECT ARM (MISCELLANEOUS) ×3 IMPLANT
DRAPE UTILITY 15X26 TOWEL STRL (DRAPES) ×3 IMPLANT
DRSG TELFA 4X3 1S NADH ST (GAUZE/BANDAGES/DRESSINGS) ×3 IMPLANT
ELECT BIVAP BIPO 22/24 DONUT (ELECTROSURGICAL) ×3
ELECT LOOP 22F BIPOLAR SML (ELECTROSURGICAL)
ELECT REM PT RETURN 9FT ADLT (ELECTROSURGICAL)
ELECTRD BIVAP BIPO 22/24 DONUT (ELECTROSURGICAL) ×2 IMPLANT
ELECTRODE LOOP 22F BIPOLAR SML (ELECTROSURGICAL) IMPLANT
ELECTRODE REM PT RTRN 9FT ADLT (ELECTROSURGICAL) IMPLANT
GLOVE BIOGEL PI IND STRL 7.5 (GLOVE) ×2 IMPLANT
GLOVE BIOGEL PI INDICATOR 7.5 (GLOVE) ×1
GOWN STRL REUS W/ TWL LRG LVL3 (GOWN DISPOSABLE) ×2 IMPLANT
GOWN STRL REUS W/ TWL XL LVL3 (GOWN DISPOSABLE) ×2 IMPLANT
GOWN STRL REUS W/TWL LRG LVL3 (GOWN DISPOSABLE) ×1
GOWN STRL REUS W/TWL XL LVL3 (GOWN DISPOSABLE) ×1
GUIDEWIRE STR DUAL SENSOR (WIRE) ×3 IMPLANT
HOLDER FOLEY CATH W/STRAP (MISCELLANEOUS) ×3 IMPLANT
KIT TURNOVER CYSTO (KITS) ×3 IMPLANT
LOOP CUT BIPOLAR 24F LRG (ELECTROSURGICAL) ×3 IMPLANT
NDL SAFETY ECLIPSE 18X1.5 (NEEDLE) ×2 IMPLANT
NEEDLE HYPO 18GX1.5 SHARP (NEEDLE) ×1
PACK CYSTO AR (MISCELLANEOUS) ×3 IMPLANT
SET CYSTO W/LG BORE CLAMP LF (SET/KITS/TRAYS/PACK) ×3 IMPLANT
SET IRRIG Y TYPE TUR BLADDER L (SET/KITS/TRAYS/PACK) ×3 IMPLANT
SOL .9 NS 3000ML IRR  AL (IV SOLUTION) ×6
SOL .9 NS 3000ML IRR UROMATIC (IV SOLUTION) ×12 IMPLANT
SURGILUBE 2OZ TUBE FLIPTOP (MISCELLANEOUS) ×3 IMPLANT
SYR 30ML LL (SYRINGE) ×3 IMPLANT
SYRINGE IRR TOOMEY STRL 70CC (SYRINGE) ×3 IMPLANT
WATER STERILE IRR 1000ML POUR (IV SOLUTION) ×3 IMPLANT

## 2018-07-16 NOTE — Transfer of Care (Signed)
Immediate Anesthesia Transfer of Care Note  Patient: Angela Russell  Procedure(s) Performed: TRANSURETHRAL RESECTION OF BLADDER TUMOR (TURBT) (N/A Bladder) CYSTOSCOPY WITH RETROGRADE PYELOGRAM (Bilateral Ureter)  Patient Location: PACU  Anesthesia Type:General  Level of Consciousness: sedated  Airway & Oxygen Therapy: Patient Spontanous Breathing and Patient connected to face mask oxygen  Post-op Assessment: Report given to RN and Post -op Vital signs reviewed and stable  Post vital signs: Reviewed and stable  Last Vitals:  Vitals Value Taken Time  BP 167/67 07/16/2018  3:44 PM  Temp 36.6 C 07/16/2018  3:43 PM  Pulse 90 07/16/2018  3:46 PM  Resp 16 07/16/2018  3:46 PM  SpO2 100 % 07/16/2018  3:46 PM  Vitals shown include unvalidated device data.  Last Pain:  Vitals:   07/16/18 1227  TempSrc: Temporal  PainSc: 0-No pain         Complications: No apparent anesthesia complications

## 2018-07-16 NOTE — Op Note (Signed)
Date of procedure: 07/16/18  Preoperative diagnosis:  1. Bladder tumor  Postoperative diagnosis:  1. Same  Procedure: 1. Cystoscopy, bilateral retrograde pyelograms with intraoperative interpretation, transurethral resection of bladder tumor (large)  Surgeon: Nickolas Madrid, MD  Anesthesia: General  Complications: None  Intraoperative findings:  1.  Bilateral retrograde pyelograms with no filling defects or abnormalities, no hydronephrosis.  Ureteral orifices not involved with tumor 2.  Large 5 cm posterior wall bladder tumor, bullous and sessile appearing 3.  All abnormal tissue resected, superficial and deep tissue sent 4.  Excellent hemostasis, bladder thin posteriorly and catheter placed  EBL: Minimal  Specimens:  1.  Bladder tumor superficial 2.  Bladder tumor resection 3.  Bladder tumor deep  Drains: 22 French two-way Foley  Indication: Angela Russell is a 68 y.o. patient with gross hematuria and concern for bladder mass on CT scan and confirmed on clinic cystoscopy.  After reviewing the management options for treatment, they elected to proceed with the above surgical procedure(s). We have discussed the potential benefits and risks of the procedure, side effects of the proposed treatment, the likelihood of the patient achieving the goals of the procedure, and any potential problems that might occur during the procedure or recuperation. Informed consent has been obtained.  Description of procedure:  The patient was taken to the operating room and general anesthesia was induced.  The patient was placed in the dorsal lithotomy position, prepped and draped in the usual sterile fashion, and preoperative antibiotics were administered. A preoperative time-out was performed.   A 23 French rigid cystoscope was used to intubate the urethra.  There were numerous blood clots in the bladder and these were evacuated using a [place] syringe.  There was approximately 30 cc of old clot.   The ureteral orifices were orthotopic bilaterally and not involved with tumor.  There was a large bullous and sessile appearing bulky tumor at the posterior bladder wall approximately 5 cm in size.  There were no lateral wall or dome tumors.  I started by performing a left retrograde pyelogram by cannulating the left ureteral orifice with a 5 French access catheter and injecting contrast.  There were no filling defects in the ureter or renal pelvis.  Identical procedure was performed on the right side, and again no filling defects were found in the ureter or renal pelvis.  There was no hydronephrosis.  The 26 French bipolar resectoscope was then inserted into the bladder under direct vision.  Cold cup biopsy forceps were used to take superficial biopsies of the posterior wall tumor.  I then used the large resecting loop to methodically resect all abnormal tissue and bulky tumor at the posterior wall.  This specimen was sent as bladder tumor resection.  Cold cup biopsy forceps were used to take multiple deep biopsies of tissue, and I was concerned for muscle invasion based on the appearance.  I then switched to the bipolar button and obtained excellent hemostasis throughout the tumor bed and at the edges of the resection.  There was one small area of deep resection at the posterior bladder wall where a cold cup deep biopsy had been taken.  Under low flow conditions there was excellent hemostasis.  The ureteral orifices remained intact.  A 20 French two-way Foley was placed with return of clear fluid, and 10 cc were placed in the balloon.  Disposition: Stable to PACU  Plan: Admit overnight, patient lives alone Maintain Foley for 2 weeks Follow-up in clinic in 2 weeks for  voiding trial and to discuss pathology  Nickolas Madrid, MD

## 2018-07-16 NOTE — Anesthesia Procedure Notes (Addendum)
Procedure Name: Intubation Date/Time: 07/16/2018 2:30 PM Performed by: Launa Grill, RN Pre-anesthesia Checklist: Patient identified, Emergency Drugs available, Patient being monitored, Timeout performed and Suction available Patient Re-evaluated:Patient Re-evaluated prior to induction Oxygen Delivery Method: Circle system utilized Preoxygenation: Pre-oxygenation with 100% oxygen Induction Type: IV induction Ventilation: Mask ventilation without difficulty Laryngoscope Size: Miller and 2 Grade View: Grade I Tube type: Oral Tube size: 7.0 mm Number of attempts: 1 Airway Equipment and Method: Stylet Placement Confirmation: ETT inserted through vocal cords under direct vision,  positive ETCO2,  CO2 detector and breath sounds checked- equal and bilateral Secured at: 22 cm Tube secured with: Tape Dental Injury: Teeth and Oropharynx as per pre-operative assessment

## 2018-07-16 NOTE — Anesthesia Post-op Follow-up Note (Signed)
Anesthesia QCDR form completed.        

## 2018-07-16 NOTE — H&P (Signed)
UROLOGY H&P UPDATE  Agree with prior H&P dated 07/15/2018. Patient with gross hematuria and clots, posterior wall bladder tumor on CT and confirmed on clinic cystoscopy.  Cardiac: RRR Lungs: CTA bilaterally  Laterality: N/a Procedure: TURBT, bilateral retrograde pyelogram  Urine: culture 1/28 no growth  We discussed transurethral resection of bladder tumor (TURBT) and risks and benefits at length. This is typically a 1 to 2-hour procedure done under general anesthesia in the operating room.  A scope is inserted through the urethra and used to resect abnormal tissue within the bladder, which is then sent to the pathologist to determine grade and stage of the tumor.  Risks include bleeding, infection, need for temporary Foley placement, and bladder perforation.  Treatment strategies are based on the type of tumor and depth of invasion.  We briefly reviewed the different treatment pathways for non-muscle invasive and muscle invasive bladder cancer.  Billey Co, MD 07/16/2018

## 2018-07-16 NOTE — Anesthesia Preprocedure Evaluation (Signed)
Anesthesia Evaluation  Patient identified by MRN, date of birth, ID band Patient awake    Reviewed: Allergy & Precautions, H&P , NPO status , Patient's Chart, lab work & pertinent test results, reviewed documented beta blocker date and time   History of Anesthesia Complications Negative for: history of anesthetic complications  Airway Mallampati: II  TM Distance: >3 FB Neck ROM: full    Dental  (+) Dental Advidsory Given, Edentulous Upper, Edentulous Lower, Upper Dentures, Lower Dentures   Pulmonary neg pulmonary ROS, former smoker,           Cardiovascular Exercise Tolerance: Good hypertension, (-) angina(-) CAD, (-) Past MI, (-) Cardiac Stents and (-) CABG (-) dysrhythmias (-) Valvular Problems/Murmurs     Neuro/Psych negative neurological ROS  negative psych ROS   GI/Hepatic negative GI ROS, Neg liver ROS,   Endo/Other  diabetes, Type 2, Oral Hypoglycemic Agents  Renal/GU Renal disease  negative genitourinary   Musculoskeletal   Abdominal   Peds  Hematology  (+) Blood dyscrasia, anemia ,   Anesthesia Other Findings Past Medical History: 01/2014: Carotid artery plaque, right No date: CKD (chronic kidney disease)     Comment:  stage 2-3 No date: Diabetes mellitus without complication (HCC) No date: DM (diabetes mellitus), type 2, uncontrolled (HCC) No date: Hyperlipidemia No date: Hypertension No date: Hypochromic microcytic anemia     Comment:  mild No date: Osteoporosis No date: Renal insufficiency No date: Rotator cuff tendonitis, right   Reproductive/Obstetrics negative OB ROS                             Anesthesia Physical Anesthesia Plan  ASA: III  Anesthesia Plan: General   Post-op Pain Management:    Induction: Intravenous  PONV Risk Score and Plan: 3 and Ondansetron, Dexamethasone and Treatment may vary due to age or medical condition  Airway Management Planned:  Oral ETT  Additional Equipment:   Intra-op Plan:   Post-operative Plan: Extubation in OR  Informed Consent: I have reviewed the patients History and Physical, chart, labs and discussed the procedure including the risks, benefits and alternatives for the proposed anesthesia with the patient or authorized representative who has indicated his/her understanding and acceptance.     Dental Advisory Given  Plan Discussed with: Anesthesiologist, CRNA and Surgeon  Anesthesia Plan Comments:         Anesthesia Quick Evaluation

## 2018-07-17 ENCOUNTER — Encounter: Payer: Self-pay | Admitting: Urology

## 2018-07-17 DIAGNOSIS — D649 Anemia, unspecified: Secondary | ICD-10-CM | POA: Diagnosis not present

## 2018-07-17 DIAGNOSIS — C679 Malignant neoplasm of bladder, unspecified: Secondary | ICD-10-CM | POA: Diagnosis not present

## 2018-07-17 DIAGNOSIS — I129 Hypertensive chronic kidney disease with stage 1 through stage 4 chronic kidney disease, or unspecified chronic kidney disease: Secondary | ICD-10-CM | POA: Diagnosis not present

## 2018-07-17 DIAGNOSIS — E1122 Type 2 diabetes mellitus with diabetic chronic kidney disease: Secondary | ICD-10-CM | POA: Diagnosis not present

## 2018-07-17 DIAGNOSIS — E118 Type 2 diabetes mellitus with unspecified complications: Secondary | ICD-10-CM | POA: Diagnosis not present

## 2018-07-17 DIAGNOSIS — Z87891 Personal history of nicotine dependence: Secondary | ICD-10-CM | POA: Diagnosis not present

## 2018-07-17 LAB — HIV ANTIBODY (ROUTINE TESTING W REFLEX): HIV Screen 4th Generation wRfx: NONREACTIVE

## 2018-07-17 MED ORDER — HYDROCODONE-ACETAMINOPHEN 5-325 MG PO TABS: 1.0000 | ORAL_TABLET | ORAL | 0 refills | Status: AC | PRN

## 2018-07-17 MED ORDER — OXYBUTYNIN CHLORIDE ER 10 MG PO TB24: 10.0000 mg | ORAL_TABLET | Freq: Every day | ORAL | 0 refills | Status: DC

## 2018-07-17 NOTE — Progress Notes (Signed)
RN educated pt how to take care of her foley catheter and provided pt with supplies,.

## 2018-07-17 NOTE — Discharge Summary (Signed)
Physician Discharge Summary  Patient ID: Angela Russell MRN: [id number] DOB/AGE: [birth year] 69 y.o.  Admit date: 07/16/2018 Discharge date: 07/17/2018  Admission Diagnoses:  Discharge Diagnoses:  Active Problems:   Bladder tumor   Discharged Condition: good  Hospital Course:  Patient was admitted for transurethral resection of a large posterior wall bladder tumor.  She was admitted overnight as she did not have someone at home to stay with her.  On postop day #1 the Foley catheter was clear yellow and she denied any abdominal pain.  She was discharged home in good condition.  Plan for follow-up in 2 weeks for catheter removal and discuss pathology results.  Treatments: surgery: TURBT with bilateral retrograde pyelograms  Discharge Exam: Blood pressure 120/69, pulse 78, temperature 97.7 F (36.5 C), temperature source Oral, resp. rate 18, height 5\' 3"  (1.6 m), weight 97.1 kg, SpO2 99 %.  Alert, no acute distress Abdomen soft, nontender 20 [place] two-way Foley with clear yellow urine  Disposition: Discharge disposition: 01-Home or Self Care       Discharge Instructions    Discharge instructions   Complete by:  As directed    You underwent transurethral resection of a bladder tumor.  Resume regular diet, drink plenty of fluids to flush the bladder.  No strenuous activity for 2 weeks.  Call the urology clinic if you develop fever over 101 degrees, or dark bloody urine, or non-draining catheter.  Follow-up in 2 weeks for catheter removal and to discuss pathology results.     Allergies as of 07/17/2018   No Known Allergies     Medication List    STOP taking these medications   fluconazole 150 MG tablet Commonly known as:  DIFLUCAN     TAKE these medications   amLODipine 5 MG tablet Commonly known as:  NORVASC Take 1 tablet (5 mg total) by mouth daily.   HYDROcodone-acetaminophen 5-325 MG tablet Commonly known as:  NORCO/VICODIN Take 1-2 tablets by mouth every  4 (four) hours as needed for up to 3 days for moderate pain.   lisinopril-hydrochlorothiazide 20-12.5 MG tablet Commonly known as:  PRINZIDE,ZESTORETIC Take 1 tablet by mouth 2 (two) times daily.   metFORMIN 1000 MG tablet Commonly known as:  GLUCOPHAGE Take 1 tablet (1,000 mg total) by mouth 2 (two) times daily.   oxybutynin 10 MG 24 hr tablet Commonly known as:  DITROPAN-XL Take 1 tablet (10 mg total) by mouth daily.   pioglitazone 15 MG tablet Commonly known as:  ACTOS Take 15 mg by mouth daily.   pravastatin 10 MG tablet Commonly known as:  PRAVACHOL Take 1 tablet (10 mg total) by mouth at bedtime.        Signed: Billey Co 07/17/2018, 8:24 AM

## 2018-07-17 NOTE — Progress Notes (Signed)
Angela Russell  A and O x 4. VSS. Pt tolerating diet well. No complaints of pain or nausea. IV removed intact, prescriptions given. Pt voiced understanding of discharge instructions with no further questions. Pt discharged via wheelchair with axillary.    Allergies as of 07/17/2018   No Known Allergies     Medication List    STOP taking these medications   fluconazole 150 MG tablet Commonly known as:  DIFLUCAN     TAKE these medications   amLODipine 5 MG tablet Commonly known as:  NORVASC Take 1 tablet (5 mg total) by mouth daily.   HYDROcodone-acetaminophen 5-325 MG tablet Commonly known as:  NORCO/VICODIN Take 1-2 tablets by mouth every 4 (four) hours as needed for up to 3 days for moderate pain.   lisinopril-hydrochlorothiazide 20-12.5 MG tablet Commonly known as:  PRINZIDE,ZESTORETIC Take 1 tablet by mouth 2 (two) times daily.   metFORMIN 1000 MG tablet Commonly known as:  GLUCOPHAGE Take 1 tablet (1,000 mg total) by mouth 2 (two) times daily.   oxybutynin 10 MG 24 hr tablet Commonly known as:  DITROPAN-XL Take 1 tablet (10 mg total) by mouth daily.   pioglitazone 15 MG tablet Commonly known as:  ACTOS Take 15 mg by mouth daily.   pravastatin 10 MG tablet Commonly known as:  PRAVACHOL Take 1 tablet (10 mg total) by mouth at bedtime.       Vitals:   07/17/18 0457 07/17/18 0826  BP: 120/69 122/63  Pulse: 78 76  Resp: 18 18  Temp: 97.7 F (36.5 C) 98.4 F (36.9 C)  SpO2: 99% 99%    Francesco Sor

## 2018-07-17 NOTE — Care Management Obs Status (Signed)
[place] NOTIFICATION   Patient Details  Name: Angela Russell MRN: [id number] Date of Birth: Jun 21, 1950   Medicare Observation Status Notification Given:  No(admitted obs less than 24 hours)    Beverly Sessions, RN 07/17/2018, 9:54 AM

## 2018-07-18 ENCOUNTER — Other Ambulatory Visit: Payer: Self-pay | Admitting: Anatomic Pathology & Clinical Pathology

## 2018-07-18 ENCOUNTER — Ambulatory Visit: Payer: Medicare Other | Admitting: Family Medicine

## 2018-07-18 LAB — SURGICAL PATHOLOGY

## 2018-07-18 NOTE — Anesthesia Postprocedure Evaluation (Signed)
Anesthesia Post Note  Patient: Angela Russell  Procedure(s) Performed: TRANSURETHRAL RESECTION OF BLADDER TUMOR (TURBT) (N/A Bladder) CYSTOSCOPY WITH RETROGRADE PYELOGRAM (Bilateral Ureter)  Patient location during evaluation: PACU Anesthesia Type: General Level of consciousness: awake and alert Pain management: pain level controlled Vital Signs Assessment: post-procedure vital signs reviewed and stable Respiratory status: spontaneous breathing, nonlabored ventilation, respiratory function stable and patient connected to nasal cannula oxygen Cardiovascular status: blood pressure returned to baseline and stable Postop Assessment: no apparent nausea or vomiting Anesthetic complications: no     Last Vitals:  Vitals:   07/17/18 0457 07/17/18 0826  BP: 120/69 122/63  Pulse: 78 76  Resp: 18 18  Temp: 36.5 C 36.9 C  SpO2: 99% 99%    Last Pain:  Vitals:   07/17/18 0826  TempSrc: Oral  PainSc: 0-No pain                 Martha Clan

## 2018-07-21 ENCOUNTER — Other Ambulatory Visit: Payer: Self-pay | Admitting: Urology

## 2018-07-24 ENCOUNTER — Inpatient Hospital Stay: Payer: Medicare Other | Attending: Anatomic Pathology & Clinical Pathology

## 2018-07-24 DIAGNOSIS — D509 Iron deficiency anemia, unspecified: Secondary | ICD-10-CM | POA: Insufficient documentation

## 2018-07-24 DIAGNOSIS — C679 Malignant neoplasm of bladder, unspecified: Secondary | ICD-10-CM | POA: Insufficient documentation

## 2018-07-24 DIAGNOSIS — E785 Hyperlipidemia, unspecified: Secondary | ICD-10-CM | POA: Insufficient documentation

## 2018-07-24 DIAGNOSIS — E119 Type 2 diabetes mellitus without complications: Secondary | ICD-10-CM | POA: Insufficient documentation

## 2018-07-24 DIAGNOSIS — Z87891 Personal history of nicotine dependence: Secondary | ICD-10-CM | POA: Insufficient documentation

## 2018-07-24 DIAGNOSIS — Z79899 Other long term (current) drug therapy: Secondary | ICD-10-CM | POA: Insufficient documentation

## 2018-07-24 DIAGNOSIS — I10 Essential (primary) hypertension: Secondary | ICD-10-CM | POA: Insufficient documentation

## 2018-07-24 DIAGNOSIS — Z7984 Long term (current) use of oral hypoglycemic drugs: Secondary | ICD-10-CM | POA: Insufficient documentation

## 2018-07-24 DIAGNOSIS — N182 Chronic kidney disease, stage 2 (mild): Secondary | ICD-10-CM | POA: Insufficient documentation

## 2018-07-24 NOTE — Progress Notes (Signed)
Tumor Board Documentation  Angela Russell was presented by Dr Erlene Quan at our Tumor Board on 07/24/2018, which included representatives from medical oncology, radiation oncology, surgical, radiology, pathology, navigation, internal medicine, research, palliative care, pulmonology.  Angela Russell currently presents for discussion, for Angela Russell, for new positive pathology with history of the following treatments: surgical intervention(s).  Additionally, we reviewed previous medical and familial history, history of present illness, and recent lab results along with all available histopathologic and imaging studies. The tumor board considered available treatment options and made the following recommendations: Surgery Totall Cystectomy, refer to Medical Oncology  The following procedures/referrals were also placed: No orders of the defined types were placed in this encounter.   Clinical Trial Status: not discussed   Staging used: AJCC Stage Group  National site-specific guidelines NCCN were discussed with respect to the case.  Tumor board is a meeting of clinicians from various specialty areas who evaluate and discuss patients for whom a multidisciplinary approach is being considered. Final determinations in the plan of care are those of the provider(s). The responsibility for follow up of recommendations given during tumor board is that of the provider.   Today's extended care, comprehensive team conference, Angela Russell was not present for the discussion and was not examined.   Multidisciplinary Tumor Board is a multidisciplinary case peer review process.  Decisions discussed in the Multidisciplinary Tumor Board reflect the opinions of the specialists present at the conference without having examined the patient.  Ultimately, treatment and diagnostic decisions rest with the primary provider(s) and the patient.

## 2018-07-25 DIAGNOSIS — E119 Type 2 diabetes mellitus without complications: Secondary | ICD-10-CM | POA: Diagnosis not present

## 2018-07-25 DIAGNOSIS — Z9889 Other specified postprocedural states: Secondary | ICD-10-CM | POA: Diagnosis not present

## 2018-07-25 DIAGNOSIS — N179 Acute kidney failure, unspecified: Secondary | ICD-10-CM | POA: Diagnosis not present

## 2018-07-25 DIAGNOSIS — N390 Urinary tract infection, site not specified: Secondary | ICD-10-CM | POA: Diagnosis not present

## 2018-07-25 DIAGNOSIS — R42 Dizziness and giddiness: Secondary | ICD-10-CM | POA: Diagnosis not present

## 2018-07-25 DIAGNOSIS — I10 Essential (primary) hypertension: Secondary | ICD-10-CM | POA: Diagnosis not present

## 2018-07-25 DIAGNOSIS — R102 Pelvic and perineal pain: Secondary | ICD-10-CM | POA: Diagnosis not present

## 2018-07-25 DIAGNOSIS — Z8551 Personal history of malignant neoplasm of bladder: Secondary | ICD-10-CM | POA: Diagnosis not present

## 2018-07-25 DIAGNOSIS — N3289 Other specified disorders of bladder: Secondary | ICD-10-CM | POA: Diagnosis not present

## 2018-07-25 DIAGNOSIS — Z960 Presence of urogenital implants: Secondary | ICD-10-CM | POA: Diagnosis not present

## 2018-07-25 DIAGNOSIS — R531 Weakness: Secondary | ICD-10-CM | POA: Diagnosis not present

## 2018-07-25 DIAGNOSIS — R310 Gross hematuria: Secondary | ICD-10-CM | POA: Diagnosis not present

## 2018-07-26 DIAGNOSIS — N3289 Other specified disorders of bladder: Secondary | ICD-10-CM | POA: Diagnosis not present

## 2018-07-26 DIAGNOSIS — R102 Pelvic and perineal pain: Secondary | ICD-10-CM | POA: Diagnosis not present

## 2018-07-26 DIAGNOSIS — R310 Gross hematuria: Secondary | ICD-10-CM | POA: Diagnosis not present

## 2018-07-26 DIAGNOSIS — Z8551 Personal history of malignant neoplasm of bladder: Secondary | ICD-10-CM | POA: Diagnosis not present

## 2018-07-26 DIAGNOSIS — R42 Dizziness and giddiness: Secondary | ICD-10-CM | POA: Diagnosis not present

## 2018-07-26 DIAGNOSIS — Z960 Presence of urogenital implants: Secondary | ICD-10-CM | POA: Diagnosis not present

## 2018-07-26 DIAGNOSIS — N179 Acute kidney failure, unspecified: Secondary | ICD-10-CM | POA: Diagnosis not present

## 2018-07-26 DIAGNOSIS — Z9889 Other specified postprocedural states: Secondary | ICD-10-CM | POA: Diagnosis not present

## 2018-07-29 ENCOUNTER — Encounter: Payer: Self-pay | Admitting: Urology

## 2018-07-29 ENCOUNTER — Ambulatory Visit (INDEPENDENT_AMBULATORY_CARE_PROVIDER_SITE_OTHER): Payer: Medicare Other | Admitting: Urology

## 2018-07-29 VITALS — BP 163/72 | HR 105 | Ht 61.0 in | Wt 214.0 lb

## 2018-07-29 DIAGNOSIS — C674 Malignant neoplasm of posterior wall of bladder: Secondary | ICD-10-CM

## 2018-07-29 NOTE — Progress Notes (Signed)
Catheter Removal  Patient is present today for a catheter removal.  90ml of water was drained from the balloon. A 20FR foley cath was removed from the bladder no complications were noted . Patient tolerated well.  Preformed by: Gordy Clement, [employer] [place] ([place])

## 2018-07-29 NOTE — Progress Notes (Signed)
   07/29/2018 4:35 PM   [place] 09-24-1950 [id number]  Reason for visit: Discuss TURBT pathology  HPI: I saw Angela Russell back in urology clinic to discuss her pathology from TURBT on 07/16/2018.  To briefly summarize, she is a 68 year old African-American female with past medical history notable for well-controlled type 2 diabetes and hypertension, and past surgical history notable for hysterectomy, who presented in late January with gross hematuria with clots.  CT scan suggested a posterior wall bladder mass, but no metastatic disease or upper tract pathology was noted on CT scan with contrast.  Clinic cystoscopy showed a 4 cm posterior wall bladder tumor.  There were no delayed films on her CT scan from the emergency department, and I performed bilateral retrograde pyelograms at time of TURBT, which showed no filling defects bilaterally.  Chest x-ray was negative for metastatic disease.  Her pathology from TURBT showed high-grade urothelial carcinoma invasive into muscularis propria, lymphovascular invasion was present as well as focal squamous differentiation and pleomorphic/sarcomatoid changes.  Carcinoma in situ was also identified.  I left a Foley in place for 2 weeks post-op in the setting of an extensive resection, urine is clear yellow today and the catheter was removed.  She has normal renal function with a creatinine of 0.99, EGFR> 60.  We had a very long conversation about the patients new diagnosis of muscle invasive bladder cancer and management options.  We discussed primary treatment options include neoadjuvant chemotherapy followed by radical cystoprostatectomy with urinary diversion, versus tri-modal therapy with a bladder sparing approach.  Cystectomy and diversion is a big surgery with 5 to 10-day hospitalization, risks of bleeding, infection, injury to surrounding structures, ileus, prolonged recovery, and a readmission rate of nearly 30%.  We discussed this is done  both open and robotically, and is dependent on surgeon preference and experience, but both have similar oncologic outcomes.  We discussed that long-term prognosis is dependent on numerous factors, including response to neoadjuvant chemotherapy, and most importantly final staging after cystectomy.  We discussed options for diversion including ileal conduit (most common), neobladder, and [place] pouch, and the differences in recovery and risks/benefits of the different diversions.   Tri-modal therapy includes maximal TURBT, chemotherapy, radiation, and close long-term surveillance with clinic cystoscopy and CT scans.  There is less evidence about the long-term durability of tri-modal therapy versus neoadjuvant chemotherapy and cystectomy, but may be appropriate in carefully selected patients ( I.e solitary tumor, ability to maximally resect disease endoscopically, strong desire for bladder sparing approach, or patients that have Russell-morbidities that preclude eligibility for cystectomy).  I strongly recommended pursuing neoadjuvant chemotherapy and radical cystectomy, lymphadenectomy, and urinary diversion in the setting of her muscle invasive disease with sarcomatoid changes  -Referral placed to medical oncology locally for neoadjuvant chemotherapy -Referral placed to urologic oncology at [facility] for consideration of radical cystectomy, lymphadenectomy, and diversion  A total of 25 minutes were spent face-to-face with the patient, greater than 50% was spent in patient education, counseling, and coordination of care regarding new diagnosis of muscle invasive bladder cancer and treatment options.  Angela Russell, [facility] [address], [place], [place] [address] [phone]

## 2018-07-29 NOTE — Patient Instructions (Signed)
Bladder Cancer  Bladder cancer is an abnormal growth of tissue in the bladder. The bladder is the balloon-like sac in the pelvis. It collects and stores urine that comes from the kidneys through the ureters. The bladder wall is made of layers. If cancer spreads into these layers and through the wall of the bladder, it becomes more difficult to treat. What are the causes? The cause of this condition is not known. What increases the risk? The following factors may make you more likely to develop this condition:  Smoking.  Workplace risks (occupational exposures), such as rubber, leather, textile, dyes, chemicals, and paint.  Being white.  Your age. Most people with bladder cancer are over the age of 55.  Being female.  Having chronic bladder inflammation.  Having a personal history of bladder cancer.  Having a family history of bladder cancer (heredity).  Having had chemotherapy or radiation therapy to the pelvis.  Having been exposed to arsenic. What are the signs or symptoms? Initial symptoms of this condition include:  Blood in the urine.  Painful urination.  Frequent bladder or urine infections.  Increase in urgency and frequency of urination. Advanced symptoms of this condition include:  Not being able to urinate.  Low back pain on one side.  Loss of appetite.  Weight loss.  Fatigue.  Swelling in the feet.  Bone pain. How is this diagnosed? This condition is diagnosed based on your medical history, a physical exam, urine tests, lab tests, imaging tests, and your symptoms. You may also have other tests or procedures done, such as:  A narrow tube being inserted into your bladder through your urethra (cystoscopy) in order to view the lining of your bladder for tumors.  A biopsy to sample the tumor to see if cancer is present. If cancer is present, it will then be staged to determine its severity and extent. Staging is an assessment of:  The size of the  tumor.  Whether the cancer has spread.  Where the cancer has spread. It is important to know how deeply into the bladder wall cancer has grown and whether cancer has spread to any other parts of your body. Staging may require blood tests or imaging tests, such as a CT scan, MRI, bone scan, or chest X-ray. How is this treated? Based on the stage of cancer, one treatment or a combination of treatments may be recommended. The most common forms of treatment are:  Surgery to remove the cancer. Procedures that may be done include transurethral resection and cystectomy.  Radiation therapy. This is high-energy X-rays or other particles. This is often used in combination with chemotherapy.  Chemotherapy. During this treatment, medicines are used to kill cancer cells.  Immunotherapy. This uses medicines to help your own immune system destroy cancer cells. Follow these instructions at home:  Take over-the-counter and prescription medicines only as told by your health care provider.  Maintain a healthy diet. Some of your treatments might affect your appetite.  Consider joining a support group. This may help you learn to cope with the stress of having bladder cancer.  Tell your cancer care team if you develop side effects. They may be able to recommend ways to relieve them.  Keep all follow-up visits as told by your health care provider. This is important. Where to find more information  American Cancer Society: www.cancer.org  National Cancer Institute (NCI): www.cancer.gov Contact a health care provider if:  You have symptoms of a urinary tract infection. These include: ?   Fever. ? Chills. ? Weakness. ? Muscle aches. ? Abdominal pain. ? Frequent and intense urge to urinate. ? Burning feeling in the bladder or urethra during urination. Get help right away if:  There is blood in your urine.  You cannot urinate.  You have severe pain or other symptoms that do not go  away. Summary  Bladder cancer is an abnormal growth of tissue in the bladder.  This condition is diagnosed based on your medical history, a physical exam, urine tests, lab tests, imaging tests, and your symptoms.  Based on the stage of cancer, surgery, chemotherapy, or a combination of treatments may be recommended.  Consider joining a support group. This may help you learn to cope with the stress of having bladder cancer. This information is not intended to replace advice given to you by your health care provider. Make sure you discuss any questions you have with your health care provider. Document Released: 05/24/2003 Document Revised: 04/24/2016 Document Reviewed: 04/24/2016 Elsevier Interactive Patient Education  2019 [employer].  Radical Cystectomy Radical cystectomy is a surgical procedure to remove the bladder. The bladder is the organ in the lower belly that holds urine until it is passed out of the body (urination). You may have a radical cystectomy if you have bladder cancer that has spread into your bladder muscle or to more than one spot in your bladder. Bladder cancer cells can also spread to lymph nodes and other organs that are near your bladder. You may need to have lymph nodes or other organs removed during a radical cystectomy. These may include:  The prostate gland and the glands that make semen (seminal vesicles) in men.  A small part of the vagina and reproductive organs, such as the ovaries, fallopian tubes, and uterus, in women. This procedure also includes a type of reconstructive surgery that allows you to urinate without a bladder (urinary diversion). Tell a health care provider about:  Any allergies you have.  All medicines you are taking, including vitamins, herbs, eye drops, creams, and over-the-counter medicines.  Any problems you or family members have had with anesthetic medicines.  Any blood disorders you have.  Any surgeries you have had.  Any  medical conditions you have, including the possibility of pregnancy. What are the risks? Generally, this is a safe procedure. However, problems may occur, including:  Infection.  Bleeding.  Injury to other organs, especially the last part of your large intestine (rectum).  Blockage in your intestine (bowel obstruction). This can lead to complications, including infection.  Blood clots that can break off and travel to other parts of the body.  Leaking of urine or obstruction of urine flow.  Pelvic nerve damage.  Sexual problems. Men may have trouble keeping an erection (erectile dysfunction). Women may lose the ability to have an orgasm.  Cancer recurrence, if the procedure does not get rid of all of the cancer. What happens before the procedure?  Ask your health care provider if you should donate some of your own blood before surgery in case you need to receive blood (transfusion) during the procedure.  Ask your health care provider about: ? Changing or stopping your regular medicines. This is especially important if you are taking diabetes medicines or blood thinners. ? Taking medicines such as aspirin and ibuprofen. These medicines can thin your blood. Do not take these medicines before your procedure if your health care provider instructs you not to.  Follow instructions from your health care provider about eating or  drinking restrictions.  Do not drink alcohol or smoke cigarettes before your procedure as told by your health care provider.  You will need to clean out your bowels before surgery (bowel prep). Follow instructions from your health care provider about how and when to do this.  Ask your health care provider how your surgical site will be marked or identified.  You may be given antibiotic medicine to help prevent infection. What happens during the procedure?  To reduce your risk of infection: ? Your health care team will wash or sanitize their hands. ? Your skin  will be washed with soap.  An IV tube will be inserted into one of your veins.  You will be given a medicine to make you fall asleep (general anesthetic).  You may have a tube passed through your nose and into your stomach (NG tube).  You may also have a narrow tube (catheter) inserted into your bladder to drain urine during and after surgery.  Your surgeon will make an incision into your belly and down through the muscles to locate your bladder.  The tissues and organs that are attached to your bladder will be cut. These include: ? The two tubes that connect your kidneys to your bladder (ureters). ? The tube that leads out of your body from your bladder (urethra). ? Blood vessels.  Men may also have the prostate gland and seminal vesicles removed.  Women may also have the uterus, fallopian tubes, ovaries, and upper part of the vagina removed.  Lymph nodes that are near your bladder will be removed.  Your surgeon will do the type of urinary diversion that is best for you. You may have: ? An incontinent diversion. In this procedure, a short piece of intestine will be removed and connected to the ureters. This will create a tunnel directly out of your body (ileal conduit). Then, one end of the conduit will be pulled through your belly wall and attached to the outside of your skin. This will make an opening (stoma) to drain urine. ? A continent diversion. In this procedure, a pouch to collect urine will be made from part of your intestine. Your ureters will be connected to one end of the pouch. There will be a valve in the other end. This end will be pulled through your belly wall and attached to the outside of your skin. You will drain urine several times a day using a tube (catheter) that is passed through the stoma. ? A neobladder. This procedure will make a new bladder from part of your intestine. Your ureters will be attached to the new bladder. The neobladder will be sewn to your  urethra. This will allow urine to flow through your urethra like it did before surgery.  After making the urinary diversion, your health care provider will close your belly incision with stitches (sutures) or staples.  A bandage (dressing) will be placed over your incision.  If you have a stoma, a bag may be placed over it to collect urine (urostomy bag).  If you have a neobladder, a catheter will be placed through your urethra to drain urine from your new bladder.  You may have a small plastic tube (surgical drain) placed so that fluid can drain away from the surgery sitewhile it heals. The procedure may vary among health care providers and hospitals. What happens after the procedure?  Your blood pressure, heart rate, breathing rate and blood oxygen level will be monitored often until the medicines you were  given have worn off.  You will be given pain medicine as needed. You may also get antibiotics and fluids through your IV tube.  You will be urged to get up and walk around as soon as you can.  Your IV tube and NG tube can be taken out after you can eat and drink on your own.  If you have a neobladder, you may be sent home with a catheter. Your health care provider will give you instructions for caring for your catheter at home.  You will be shown how to care for the type of urinary diversion that you have. Make sure that you understand these instructions. Follow them carefully.  You may be sent home with a surgical drain. Your health care provider will give you instructions for caring for your drain at home. This information is not intended to replace advice given to you by your health care provider. Make sure you discuss any questions you have with your health care provider. Document Released: 10/05/2014 Document Revised: 10/27/2015 Document Reviewed: 07/26/2014 Elsevier Interactive Patient Education  2019 [employer].

## 2018-07-31 ENCOUNTER — Ambulatory Visit: Payer: Medicare Other | Admitting: Urology

## 2018-08-01 ENCOUNTER — Other Ambulatory Visit: Payer: Self-pay

## 2018-08-01 ENCOUNTER — Telehealth: Payer: Self-pay | Admitting: *Deleted

## 2018-08-01 ENCOUNTER — Encounter: Payer: Self-pay | Admitting: Oncology

## 2018-08-01 ENCOUNTER — Inpatient Hospital Stay (HOSPITAL_BASED_OUTPATIENT_CLINIC_OR_DEPARTMENT_OTHER): Payer: Medicare Other | Admitting: Oncology

## 2018-08-01 ENCOUNTER — Encounter: Payer: Self-pay | Admitting: *Deleted

## 2018-08-01 ENCOUNTER — Encounter (INDEPENDENT_AMBULATORY_CARE_PROVIDER_SITE_OTHER): Payer: Self-pay

## 2018-08-01 ENCOUNTER — Telehealth (INDEPENDENT_AMBULATORY_CARE_PROVIDER_SITE_OTHER): Payer: Self-pay

## 2018-08-01 VITALS — BP 139/65 | HR 83 | Temp 97.3°F | Resp 18 | Ht 63.0 in | Wt 206.9 lb

## 2018-08-01 DIAGNOSIS — Z7984 Long term (current) use of oral hypoglycemic drugs: Secondary | ICD-10-CM | POA: Diagnosis not present

## 2018-08-01 DIAGNOSIS — I10 Essential (primary) hypertension: Secondary | ICD-10-CM | POA: Diagnosis not present

## 2018-08-01 DIAGNOSIS — N182 Chronic kidney disease, stage 2 (mild): Secondary | ICD-10-CM

## 2018-08-01 DIAGNOSIS — D509 Iron deficiency anemia, unspecified: Secondary | ICD-10-CM

## 2018-08-01 DIAGNOSIS — Z7189 Other specified counseling: Secondary | ICD-10-CM

## 2018-08-01 DIAGNOSIS — Z79899 Other long term (current) drug therapy: Secondary | ICD-10-CM | POA: Diagnosis not present

## 2018-08-01 DIAGNOSIS — Z87891 Personal history of nicotine dependence: Secondary | ICD-10-CM | POA: Diagnosis not present

## 2018-08-01 DIAGNOSIS — E785 Hyperlipidemia, unspecified: Secondary | ICD-10-CM | POA: Diagnosis not present

## 2018-08-01 DIAGNOSIS — E119 Type 2 diabetes mellitus without complications: Secondary | ICD-10-CM | POA: Diagnosis not present

## 2018-08-01 DIAGNOSIS — C679 Malignant neoplasm of bladder, unspecified: Secondary | ICD-10-CM

## 2018-08-01 NOTE — Progress Notes (Signed)
Patient here for initial visit. °

## 2018-08-01 NOTE — Telephone Encounter (Signed)
Spoke with the patient and she is scheduled for her port placement with Dr. Lucky Cowboy on 08/06/2018 with a 11:00 am arrival time. Patient is aware of pre-procedure instructions.

## 2018-08-01 NOTE — Patient Instructions (Signed)
Methotrexate injection What is this medicine? METHOTREXATE (METH oh TREX ate) is a chemotherapy drug used to treat cancer including breast cancer, leukemia, and lymphoma. This medicine can also be used to treat psoriasis and certain kinds of arthritis. This medicine may be used for other purposes; ask your health care provider or pharmacist if you have questions. What should I tell my health care provider before I take this medicine? They need to know if you have any of these conditions: -fluid in the stomach area or lungs -if you often drink alcohol -infection or immune system problems -kidney disease -liver disease -low blood counts, like low white cell, platelet, or red cell counts -lung disease -radiation therapy -stomach ulcers -ulcerative colitis -an unusual or allergic reaction to methotrexate, other medicines, foods, dyes, or preservatives -pregnant or trying to get pregnant -breast-feeding How should I use this medicine? This medicine is for infusion into a vein or for injection into muscle or into the spinal fluid (whichever applies). It is usually given by a health care professional in a hospital or clinic setting. In rare cases, you might get this medicine at home. You will be taught how to give this medicine. Use exactly as directed. Take your medicine at regular intervals. Do not take your medicine more often than directed. If this medicine is used for arthritis or psoriasis, it should be taken weekly, NOT daily. It is important that you put your used needles and syringes in a special sharps container. Do not put them in a trash can. If you do not have a sharps container, call your pharmacist or healthcare provider to get one. Talk to your pediatrician regarding the use of this medicine in children. While this drug may be prescribed for children as young as 2 years for selected conditions, precautions do apply. Overdosage: If you think you have taken too much of this medicine  contact a poison control center or emergency room at once. NOTE: This medicine is only for you. Do not share this medicine with others. What if I miss a dose? It is important not to miss your dose. Call your doctor or health care professional if you are unable to keep an appointment. If you give yourself the medicine and you miss a dose, talk with your doctor or health care professional. Do not take double or extra doses. What may interact with this medicine? This medicine may interact with the following medications: -acitretin -aspirin or aspirin-like medicines including salicylates -azathioprine -certain antibiotics like chloramphenicol, penicillin, tetracycline -certain medicines for stomach problems like esomeprazole, omeprazole, pantoprazole -cyclosporine -gold -hydroxychloroquine -live virus vaccines -mercaptopurine -NSAIDs, medicines for pain and inflammation, like ibuprofen or naproxen -other cytotoxic agents -penicillamine -phenylbutazone -phenytoin -probenacid -retinoids such as isotretinoin and tretinoin -steroid medicines like prednisone or cortisone -sulfonamides like sulfasalazine and trimethoprim/sulfamethoxazole -theophylline This list may not describe all possible interactions. Give your health care provider a list of all the medicines, herbs, non-prescription drugs, or dietary supplements you use. Also tell them if you smoke, drink alcohol, or use illegal drugs. Some items may interact with your medicine. What should I watch for while using this medicine? Avoid alcoholic drinks. In some cases, you may be given additional medicines to help with side effects. Follow all directions for their use. This medicine can make you more sensitive to the sun. Keep out of the sun. If you cannot avoid being in the sun, wear protective clothing and use sunscreen. Do not use sun lamps or tanning beds/booths. You may get drowsy   or dizzy. Do not drive, use machinery, or do anything that  needs mental alertness until you know how this medicine affects you. Do not stand or sit up quickly, especially if you are an older patient. This reduces the risk of dizzy or fainting spells. You may need blood work done while you are taking this medicine. Call your doctor or health care professional for advice if you get a fever, chills or sore throat, or other symptoms of a cold or flu. Do not treat yourself. This drug decreases your body's ability to fight infections. Try to avoid being around people who are sick. This medicine may increase your risk to bruise or bleed. Call your doctor or health care professional if you notice any unusual bleeding. Check with your doctor or health care professional if you get an attack of severe diarrhea, nausea and vomiting, or if you sweat a lot. The loss of too much body fluid can make it dangerous for you to take this medicine. Talk to your doctor about your risk of cancer. You may be more at risk for certain types of cancers if you take this medicine. Both men and women must use effective birth control with this medicine. Do not become pregnant while taking this medicine or until at least 1 normal menstrual cycle has occurred after stopping it. Women should inform their doctor if they wish to become pregnant or think they might be pregnant. Men should not father a child while taking this medicine and for 3 months after stopping it. There is a potential for serious side effects to an unborn child. Talk to your health care professional or pharmacist for more information. Do not breast-feed an infant while taking this medicine. What side effects may I notice from receiving this medicine? Side effects that you should report to your doctor or health care professional as soon as possible: -allergic reactions like skin rash, itching or hives, swelling of the face, lips, or tongue -back pain -breathing problems or shortness of breath -confusion -diarrhea -dry,  nonproductive cough -low blood counts - this medicine may decrease the number of Rede blood cells, red blood cells and platelets. You may be at increased risk of infections and bleeding -mouth sores -redness, blistering, peeling or loosening of the skin, including inside the mouth -seizures -severe headaches -signs of infection - fever or chills, cough, sore throat, pain or difficulty passing urine -signs and symptoms of bleeding such as bloody or black, tarry stools; red or dark-brown urine; spitting up blood or brown material that looks like coffee grounds; red spots on the skin; unusual bruising or bleeding from the eye, gums, or nose -signs and symptoms of kidney injury like trouble passing urine or change in the amount of urine -signs and symptoms of liver injury like dark yellow or brown urine; general ill feeling or flu-like symptoms; light-colored stools; loss of appetite; nausea; right upper belly pain; unusually weak or tired; yellowing of the eyes or skin -stiff neck -vomiting Side effects that usually do not require medical attention (report to your doctor or health care professional if they continue or are bothersome): -dizziness -hair loss -headache -stomach pain -upset stomach This list may not describe all possible side effects. Call your doctor for medical advice about side effects. You may report side effects to FDA at 1-800-FDA-1088. Where should I keep my medicine? If you are using this medicine at home, you will be instructed on how to store this medicine. Throw away any unused medicine after   the expiration date on the label. NOTE: This sheet is a summary. It may not cover all possible information. If you have questions about this medicine, talk to your doctor, pharmacist, or health care provider.  2019 [employer] Standard (2017-01-10 13:31:42) Vinblastine injection What is this medicine? VINBLASTINE (vin BLAS teen) is a chemotherapy drug. It slows the growth of  cancer cells. This medicine is used to treat many types of cancer like breast cancer, testicular cancer, Hodgkin's disease, non-Hodgkin's lymphoma, and sarcoma. This medicine may be used for other purposes; ask your health care provider or pharmacist if you have questions. COMMON BRAND NAME(S): Velban What should I tell my health care provider before I take this medicine? They need to know if you have any of these conditions: -blood disorders -dental disease -gout -infection (especially a virus infection such as chickenpox, cold sores, or herpes) -liver disease -lung disease -nervous system disease -recent or ongoing radiation therapy -an unusual or allergic reaction to vinblastine, other chemotherapy agents, other medicines, foods, dyes, or preservatives -pregnant or trying to get pregnant -breast-feeding How should I use this medicine? This drug is given as an infusion into a vein. It is administered in a hospital or clinic by a specially trained health care professional. If you have pain, swelling, burning or any unusual feeling around the site of your injection, tell your health care professional right away. Talk to your pediatrician regarding the use of this medicine in children. While this drug may be prescribed for selected conditions, precautions do apply. Overdosage: If you think you have taken too much of this medicine contact a poison control center or emergency room at once. NOTE: This medicine is only for you. Do not share this medicine with others. What if I miss a dose? It is important not to miss your dose. Call your doctor or health care professional if you are unable to keep an appointment. What may interact with this medicine? Do not take this medicine with any of the following medications: -erythromycin -itraconazole -mibefradil -voriconazole This medicine may also interact with the following medications: -cyclosporine -fluconazole -ketoconazole -medicines for  seizures like phenytoin -medicines to increase blood counts like filgrastim, pegfilgrastim, sargramostim -vaccines -verapamil Talk to your doctor or health care professional before taking any of these medicines: -acetaminophen -aspirin -ibuprofen -ketoprofen -naproxen This list may not describe all possible interactions. Give your health care provider a list of all the medicines, herbs, non-prescription drugs, or dietary supplements you use. Also tell them if you smoke, drink alcohol, or use illegal drugs. Some items may interact with your medicine. What should I watch for while using this medicine? Your condition will be monitored carefully while you are receiving this medicine. You will need important blood work done while you are taking this medicine. This drug may make you feel generally unwell. This is not uncommon, as chemotherapy can affect healthy cells as well as cancer cells. Report any side effects. Continue your course of treatment even though you feel ill unless your doctor tells you to stop. In some cases, you may be given additional medicines to help with side effects. Follow all directions for their use. Call your doctor or health care professional for advice if you get a fever, chills or sore throat, or other symptoms of a cold or flu. Do not treat yourself. This drug decreases your body's ability to fight infections. Try to avoid being around people who are sick. This medicine may increase your risk to bruise or bleed. Call your doctor  or health care professional if you notice any unusual bleeding. Be careful brushing and flossing your teeth or using a toothpick because you may get an infection or bleed more easily. If you have any dental work done, tell your dentist you are receiving this medicine. Avoid taking products that contain aspirin, acetaminophen, ibuprofen, naproxen, or ketoprofen unless instructed by your doctor. These medicines may hide a fever. Do not become  pregnant while taking this medicine. Women should inform their doctor if they wish to become pregnant or think they might be pregnant. There is a potential for serious side effects to an unborn child. Talk to your health care professional or pharmacist for more information. Do not breast-feed an infant while taking this medicine. Men may have a lower sperm count while taking this medicine. Talk to your doctor if you plan to father a child. What side effects may I notice from receiving this medicine? Side effects that you should report to your doctor or health care professional as soon as possible: -allergic reactions like skin rash, itching or hives, swelling of the face, lips, or tongue -low blood counts - This drug may decrease the number of white blood cells, red blood cells and platelets. You may be at increased risk for infections and bleeding. -signs of infection - fever or chills, cough, sore throat, pain or difficulty passing urine -signs of decreased platelets or bleeding - bruising, pinpoint red spots on the skin, black, tarry stools, nosebleeds -signs of decreased red blood cells - unusually weak or tired, fainting spells, lightheadedness -breathing problems -changes in hearing -change in the amount of urine -chest pain -high blood pressure -mouth sores -nausea and vomiting -pain, swelling, redness or irritation at the injection site -pain, tingling, numbness in the hands or feet -problems with balance, dizziness -seizures Side effects that usually do not require medical attention (report to your doctor or health care professional if they continue or are bothersome): -constipation -hair loss -jaw pain -loss of appetite -sensitivity to light -stomach pain -tumor pain This list may not describe all possible side effects. Call your doctor for medical advice about side effects. You may report side effects to FDA at 1-800-FDA-1088. Where should I keep my medicine? This drug is  given in a hospital or clinic and will not be stored at home. NOTE: This sheet is a summary. It may not cover all possible information. If you have questions about this medicine, talk to your doctor, pharmacist, or health care provider.  2019 [employer]/Gold Standard (2008-02-16 17:15:59) Doxorubicin injection What is this medicine? DOXORUBICIN (dox oh ROO bi sin) is a chemotherapy drug. It is used to treat many kinds of cancer like leukemia, lymphoma, neuroblastoma, sarcoma, and Wilms' tumor. It is also used to treat bladder cancer, breast cancer, lung cancer, ovarian cancer, stomach cancer, and thyroid cancer. This medicine may be used for other purposes; ask your health care provider or pharmacist if you have questions. COMMON BRAND NAME(S): Adriamycin, Adriamycin PFS, Adriamycin RDF, Rubex What should I tell my health care provider before I take this medicine? They need to know if you have any of these conditions: -heart disease -history of low blood counts caused by a medicine -liver disease -recent or ongoing radiation therapy -an unusual or allergic reaction to doxorubicin, other chemotherapy agents, other medicines, foods, dyes, or preservatives -pregnant or trying to get pregnant -breast-feeding How should I use this medicine? This drug is given as an infusion into a vein. It is administered in a hospital or  clinic by a specially trained health care professional. If you have pain, swelling, burning or any unusual feeling around the site of your injection, tell your health care professional right away. Talk to your pediatrician regarding the use of this medicine in children. Special care may be needed. Overdosage: If you think you have taken too much of this medicine contact a poison control center or emergency room at once. NOTE: This medicine is only for you. Do not share this medicine with others. What if I miss a dose? It is important not to miss your dose. Call your doctor or  health care professional if you are unable to keep an appointment. What may interact with this medicine? This medicine may interact with the following medications: -6-mercaptopurine -paclitaxel -phenytoin -St. John's Wort -trastuzumab -verapamil This list may not describe all possible interactions. Give your health care provider a list of all the medicines, herbs, non-prescription drugs, or dietary supplements you use. Also tell them if you smoke, drink alcohol, or use illegal drugs. Some items may interact with your medicine. What should I watch for while using this medicine? This drug may make you feel generally unwell. This is not uncommon, as chemotherapy can affect healthy cells as well as cancer cells. Report any side effects. Continue your course of treatment even though you feel ill unless your doctor tells you to stop. There is a maximum amount of this medicine you should receive throughout your life. The amount depends on the medical condition being treated and your overall health. Your doctor will watch how much of this medicine you receive in your lifetime. Tell your doctor if you have taken this medicine before. You may need blood work done while you are taking this medicine. Your urine may turn red for a few days after your dose. This is not blood. If your urine is dark or brown, call your doctor. In some cases, you may be given additional medicines to help with side effects. Follow all directions for their use. Call your doctor or health care professional for advice if you get a fever, chills or sore throat, or other symptoms of a cold or flu. Do not treat yourself. This drug decreases your body's ability to fight infections. Try to avoid being around people who are sick. This medicine may increase your risk to bruise or bleed. Call your doctor or health care professional if you notice any unusual bleeding. Talk to your doctor about your risk of cancer. You may be more at risk for  certain types of cancers if you take this medicine. Do not become pregnant while taking this medicine or for 6 months after stopping it. Women should inform their doctor if they wish to become pregnant or think they might be pregnant. Men should not father a child while taking this medicine and for 6 months after stopping it. There is a potential for serious side effects to an unborn child. Talk to your health care professional or pharmacist for more information. Do not breast-feed an infant while taking this medicine. This medicine has caused ovarian failure in some women and reduced sperm counts in some men This medicine may interfere with the ability to have a child. Talk with your doctor or health care professional if you are concerned about your fertility. This medicine may cause a decrease in Co-Enzyme Q-10. You should make sure that you get enough Co-Enzyme Q-10 while you are taking this medicine. Discuss the foods you eat and the vitamins you take with  your health care professional. What side effects may I notice from receiving this medicine? Side effects that you should report to your doctor or health care professional as soon as possible: -allergic reactions like skin rash, itching or hives, swelling of the face, lips, or tongue -breathing problems -chest pain -fast or irregular heartbeat -low blood counts - this medicine may decrease the number of white blood cells, red blood cells and platelets. You may be at increased risk for infections and bleeding. -pain, redness, or irritation at site where injected -signs of infection - fever or chills, cough, sore throat, pain or difficulty passing urine -signs of decreased platelets or bleeding - bruising, pinpoint red spots on the skin, black, tarry stools, blood in the urine -swelling of the ankles, feet, hands -tiredness -weakness Side effects that usually do not require medical attention (report to your doctor or health care professional if  they continue or are bothersome): -diarrhea -hair loss -mouth sores -nail discoloration or damage -nausea -red colored urine -vomiting This list may not describe all possible side effects. Call your doctor for medical advice about side effects. You may report side effects to FDA at 1-800-FDA-1088. Where should I keep my medicine? This drug is given in a hospital or clinic and will not be stored at home. NOTE: This sheet is a summary. It may not cover all possible information. If you have questions about this medicine, talk to your doctor, pharmacist, or health care provider.  2019 [employer]/Gold Standard (2017-01-02 11:01:26) Cisplatin injection What is this medicine? CISPLATIN (SIS pla tin) is a chemotherapy drug. It targets fast dividing cells, like cancer cells, and causes these cells to die. This medicine is used to treat many types of cancer like bladder, ovarian, and testicular cancers. This medicine may be used for other purposes; ask your health care provider or pharmacist if you have questions. COMMON BRAND NAME(S): Platinol, Platinol -AQ What should I tell my health care provider before I take this medicine? They need to know if you have any of these conditions: -blood disorders -hearing problems -kidney disease -recent or ongoing radiation therapy -an unusual or allergic reaction to cisplatin, carboplatin, other chemotherapy, other medicines, foods, dyes, or preservatives -pregnant or trying to get pregnant -breast-feeding How should I use this medicine? This drug is given as an infusion into a vein. It is administered in a hospital or clinic by a specially trained health care professional. Talk to your pediatrician regarding the use of this medicine in children. Special care may be needed. Overdosage: If you think you have taken too much of this medicine contact a poison control center or emergency room at once. NOTE: This medicine is only for you. Do not share this  medicine with others. What if I miss a dose? It is important not to miss a dose. Call your doctor or health care professional if you are unable to keep an appointment. What may interact with this medicine? -dofetilide -foscarnet -medicines for seizures -medicines to increase blood counts like filgrastim, pegfilgrastim, sargramostim -probenecid -pyridoxine used with altretamine -rituximab -some antibiotics like amikacin, gentamicin, neomycin, polymyxin B, streptomycin, tobramycin -sulfinpyrazone -vaccines -zalcitabine Talk to your doctor or health care professional before taking any of these medicines: -acetaminophen -aspirin -ibuprofen -ketoprofen -naproxen This list may not describe all possible interactions. Give your health care provider a list of all the medicines, herbs, non-prescription drugs, or dietary supplements you use. Also tell them if you smoke, drink alcohol, or use illegal drugs. Some items may interact with  your medicine. What should I watch for while using this medicine? Your condition will be monitored carefully while you are receiving this medicine. You will need important blood work done while you are taking this medicine. This drug may make you feel generally unwell. This is not uncommon, as chemotherapy can affect healthy cells as well as cancer cells. Report any side effects. Continue your course of treatment even though you feel ill unless your doctor tells you to stop. In some cases, you may be given additional medicines to help with side effects. Follow all directions for their use. Call your doctor or health care professional for advice if you get a fever, chills or sore throat, or other symptoms of a cold or flu. Do not treat yourself. This drug decreases your body's ability to fight infections. Try to avoid being around people who are sick. This medicine may increase your risk to bruise or bleed. Call your doctor or health care professional if you notice any  unusual bleeding. Be careful brushing and flossing your teeth or using a toothpick because you may get an infection or bleed more easily. If you have any dental work done, tell your dentist you are receiving this medicine. Avoid taking products that contain aspirin, acetaminophen, ibuprofen, naproxen, or ketoprofen unless instructed by your doctor. These medicines may hide a fever. Do not become pregnant while taking this medicine. Women should inform their doctor if they wish to become pregnant or think they might be pregnant. There is a potential for serious side effects to an unborn child. Talk to your health care professional or pharmacist for more information. Do not breast-feed an infant while taking this medicine. Drink fluids as directed while you are taking this medicine. This will help protect your kidneys. Call your doctor or health care professional if you get diarrhea. Do not treat yourself. What side effects may I notice from receiving this medicine? Side effects that you should report to your doctor or health care professional as soon as possible: -allergic reactions like skin rash, itching or hives, swelling of the face, lips, or tongue -signs of infection - fever or chills, cough, sore throat, pain or difficulty passing urine -signs of decreased platelets or bleeding - bruising, pinpoint red spots on the skin, black, tarry stools, nosebleeds -signs of decreased red blood cells - unusually weak or tired, fainting spells, lightheadedness -breathing problems -changes in hearing -gout pain -low blood counts - This drug may decrease the number of white blood cells, red blood cells and platelets. You may be at increased risk for infections and bleeding. -nausea and vomiting -pain, swelling, redness or irritation at the injection site -pain, tingling, numbness in the hands or feet -problems with balance, movement -trouble passing urine or change in the amount of urine Side effects that  usually do not require medical attention (report to your doctor or health care professional if they continue or are bothersome): -changes in vision -loss of appetite -metallic taste in the mouth or changes in taste This list may not describe all possible side effects. Call your doctor for medical advice about side effects. You may report side effects to FDA at 1-800-FDA-1088. Where should I keep my medicine? This drug is given in a hospital or clinic and will not be stored at home. NOTE: This sheet is a summary. It may not cover all possible information. If you have questions about this medicine, talk to your doctor, pharmacist, or health care provider.  2019 Elsevier/Gold Standard (2007-08-26 14:40:54)

## 2018-08-01 NOTE — Telephone Encounter (Signed)
Attempted to contact the patient to schedule her for a port placement. I was unable to leave a message due to the patient's voicemail being full.

## 2018-08-01 NOTE — Telephone Encounter (Signed)
Per Dr. Tasia Catchings 08/01/18 Secure Chat message to also schedule patient for a CT Chest w/o  CT was scheduled as requested... A detailed message was left making her aware of the scheduled date, location, telephone number and time of the scheduled appt.

## 2018-08-02 ENCOUNTER — Ambulatory Visit
Admission: RE | Admit: 2018-08-02 | Discharge: 2018-08-02 | Disposition: A | Discharge status: Home / Self Care | Payer: Medicare Other | Source: Ambulatory Visit | Attending: Oncology | Admitting: Oncology

## 2018-08-02 DIAGNOSIS — I10 Essential (primary) hypertension: Secondary | ICD-10-CM | POA: Diagnosis not present

## 2018-08-02 DIAGNOSIS — C679 Malignant neoplasm of bladder, unspecified: Secondary | ICD-10-CM | POA: Insufficient documentation

## 2018-08-02 DIAGNOSIS — E119 Type 2 diabetes mellitus without complications: Secondary | ICD-10-CM | POA: Diagnosis not present

## 2018-08-02 DIAGNOSIS — E785 Hyperlipidemia, unspecified: Secondary | ICD-10-CM | POA: Diagnosis not present

## 2018-08-02 DIAGNOSIS — Z09 Encounter for follow-up examination after completed treatment for conditions other than malignant neoplasm: Secondary | ICD-10-CM | POA: Diagnosis not present

## 2018-08-03 DIAGNOSIS — C679 Malignant neoplasm of bladder, unspecified: Secondary | ICD-10-CM | POA: Insufficient documentation

## 2018-08-03 DIAGNOSIS — Z7189 Other specified counseling: Secondary | ICD-10-CM | POA: Insufficient documentation

## 2018-08-03 MED ORDER — DEXAMETHASONE 4 MG PO TABS: ORAL_TABLET | ORAL | 1 refills | Status: DC

## 2018-08-03 MED ORDER — LIDOCAINE-PRILOCAINE 2.5-2.5 % EX CREA: TOPICAL_CREAM | CUTANEOUS | 3 refills | Status: DC

## 2018-08-03 MED ORDER — ONDANSETRON HCL 8 MG PO TABS: 8.0000 mg | ORAL_TABLET | Freq: Two times a day (BID) | ORAL | 1 refills | Status: DC | PRN

## 2018-08-03 MED ORDER — PROCHLORPERAZINE MALEATE 10 MG PO TABS: 10.0000 mg | ORAL_TABLET | Freq: Four times a day (QID) | ORAL | 1 refills | Status: DC | PRN

## 2018-08-03 NOTE — Progress Notes (Signed)
Hematology/Oncology Consult note [facility] [phone] [phone]   Patient Care Team: Volney American, PA-C as PCP - General (Family Medicine) Idelle Leech, [place] (Optometry)  REFERRING PROVIDER: Dr.Sninsky CHIEF COMPLAINTS/REASON FOR VISIT:  Evaluation of bladder cancer  HISTORY OF PRESENTING ILLNESS:  Angela Russell is a  68 y.o.  female with PMH listed below who was referred to me for evaluation of newly diagnosed bladder cancer. Patient initially presented to emergency room at the end of January 2020 for evaluation of dysuria, hematuria and the left lower quadrant inguinal pain and flank pain.  1/28 2020 CT renal stone study showed suspected irregular wall thickening about the superior bladder, not well assessed due to degree of bladder distention.  Recommend cystoscopy for further evaluation.  No renal stone or obstructive uropathy.  Patient was given IV Rocephin and referred patient for outpatient urology follow-up.  Urine culture was negative.  She again presented to ER after 2 days with similar symptoms.  07/03/2018 another CT abdomen pelvis with contrast was done which showed no nephrolithiasis or hydronephrosis is identified.  Bladder is decompressed limiting evaluation.  Patient was evaluated by urology Dr. [name] on 07/15/2018.  She underwent cystoscopy and bilateral retrograde pyelogram on 07/16/2018.  Pyelogram did not show any filling defect or abnormalities.  No hydronephrosis.  Ureteral orifice was not involved with tumor.  There is a large 5 cm posterior wall bladder tumor, bullous and sessile appearing.  Patient underwent TURBT.  Pathology: High-grade urothelial carcinoma, invasive into muscularis propria.  Lymphovascular invasion is present.  Carcinoma in situ is also identified.  Focal squamous differentiation is noted, areas of invasive carcinoma display pleomorphic/sarcomatoid changes.  07/16/2018 chest x-ray showed normal  mediastinum and cardiac silhouette.  Normal pulmonary vasculature.  No evidence of effusion, infiltrate or pneumothorax.  No acute bony abnormality.  Degenerative osteophytosis of the spine.  Patient has 25-pack-year smoking history, quit approximately 20 years ago.  No family history of any urology malignancies. Today she has accompanied by multiple family members to establish care with oncology and discuss management plan.  Today she denies any dysuria, hematuria, abdominal pain, cough, hemoptysis.  She has lost weight since her surgery.   Review of Systems  Constitutional: Negative for appetite change, chills, fatigue and fever.  HENT:   Negative for hearing loss and voice change.   Eyes: Negative for eye problems.  Respiratory: Negative for chest tightness and cough.   Cardiovascular: Negative for chest pain.  Gastrointestinal: Negative for abdominal distention, abdominal pain and blood in stool.  Endocrine: Negative for hot flashes.  Genitourinary: Negative for difficulty urinating and frequency.   Musculoskeletal: Negative for arthralgias.  Skin: Negative for itching and rash.  Neurological: Negative for extremity weakness.  Hematological: Negative for adenopathy.  Psychiatric/Behavioral: Negative for confusion.    MEDICAL HISTORY:  Past Medical History:  Diagnosis Date  . Carotid artery plaque, right 01/2014  . CKD (chronic kidney disease)    stage 2-3  . Diabetes mellitus without complication ([place])   . DM (diabetes mellitus), type 2, uncontrolled ([place])   . Hyperlipidemia   . Hypertension   . Hypochromic microcytic anemia    mild  . Osteoporosis   . Renal insufficiency   . Rotator cuff tendonitis, right     SURGICAL HISTORY: Past Surgical History:  Procedure Laterality Date  . ABDOMINAL HYSTERECTOMY  2000   due to bleeding and fibroids, partial- still has ovaries  . CYSTOSCOPY W/ RETROGRADES Bilateral 07/16/2018  Procedure: CYSTOSCOPY WITH RETROGRADE PYELOGRAM;   Surgeon: Billey Co, MD;  Location: [facility] ORS;  Service: Urology;  Laterality: Bilateral;  . KNEE SURGERY Left 03/17/2013   torn meniscus  . TRANSURETHRAL RESECTION OF BLADDER TUMOR N/A 07/16/2018   Procedure: TRANSURETHRAL RESECTION OF BLADDER TUMOR (TURBT);  Surgeon: Billey Co, MD;  Location: [facility] ORS;  Service: Urology;  Laterality: N/A;    SOCIAL HISTORY: Social History   Socioeconomic History  . Marital status: Single    Spouse name: Not on file  . Number of children: Not on file  . Years of education: Not on file  . Highest education level: Not on file  Occupational History  . Not on file  Social Needs  . Financial resource strain: Not hard at all  . Food insecurity:    Worry: Never true    Inability: Never true  . Transportation needs:    Medical: No    Non-medical: No  Tobacco Use  . Smoking status: Former Smoker    Last attempt to quit: 06/05/1991    Years since quitting: 27.1  . Smokeless tobacco: Never Used  Substance and Sexual Activity  . Alcohol use: No  . Drug use: No  . Sexual activity: Never  Lifestyle  . Physical activity:    Days per week: 2 days    Minutes per session: 30 min  . Stress: Not at all  Relationships  . Social connections:    Talks on phone: More than three times a week    Gets together: More than three times a week    Attends religious service: More than 4 times per year    Active member of club or organization: Yes    Attends meetings of clubs or organizations: More than 4 times per year    Relationship status: Separated  . Intimate partner violence:    Fear of current or ex partner: No    Emotionally abused: No    Physically abused: No    Forced sexual activity: No  Other Topics Concern  . Not on file  Social History Narrative   Working full time    FAMILY HISTORY: Family History  Problem Relation Age of Onset  . Heart disease Mother   . Heart attack Mother   . Arthritis Father   . Diabetes Brother      ALLERGIES:  has No Known Allergies.  MEDICATIONS:  Current Outpatient Medications  Medication Sig Dispense Refill  . amLODipine (NORVASC) 5 MG tablet Take 1 tablet (5 mg total) by mouth daily. 90 tablet 3  . lisinopril-hydrochlorothiazide (PRINZIDE,ZESTORETIC) 20-12.5 MG tablet Take 1 tablet by mouth 2 (two) times daily.  0  . metFORMIN (GLUCOPHAGE) 1000 MG tablet Take 1 tablet (1,000 mg total) by mouth 2 (two) times daily. 180 tablet 1  . pioglitazone (ACTOS) 15 MG tablet Take 15 mg by mouth daily.    . pravastatin (PRAVACHOL) 10 MG tablet Take 1 tablet (10 mg total) by mouth at bedtime. 90 tablet 1  . oxybutynin (DITROPAN-XL) 10 MG 24 hr tablet Take 1 tablet (10 mg total) by mouth daily. (Patient not taking: Reported on 08/01/2018) 14 tablet 0   No current facility-administered medications for this visit.      PHYSICAL EXAMINATION: ECOG PERFORMANCE STATUS: 0 - Asymptomatic Vitals:   08/01/18 1052  BP: 139/65  Pulse: 83  Resp: 18  Temp: (!) 97.3 F (36.3 C)   Filed Weights   08/01/18 1052  Weight: 206 lb 14.4 oz (  93.8 kg)    Physical Exam Constitutional:      General: She is not in acute distress. HENT:     Head: Normocephalic and atraumatic.  Eyes:     General: No scleral icterus.    Pupils: Pupils are equal, round, and reactive to light.  Neck:     Musculoskeletal: Normal range of motion and neck supple.  Cardiovascular:     Rate and Rhythm: Normal rate and regular rhythm.     Heart sounds: Normal heart sounds.  Pulmonary:     Effort: Pulmonary effort is normal. No respiratory distress.     Breath sounds: No wheezing.  Abdominal:     General: Bowel sounds are normal. There is no distension.     Palpations: Abdomen is soft. There is no mass.     Tenderness: There is no abdominal tenderness.  Musculoskeletal: Normal range of motion.        General: No deformity.  Skin:    General: Skin is warm and dry.     Findings: No erythema or rash.  Neurological:      Mental Status: She is alert and oriented to person, place, and time.     Cranial Nerves: No cranial nerve deficit.     Coordination: Coordination normal.  Psychiatric:        Behavior: Behavior normal.        Thought Content: Thought content normal.     RADIOGRAPHIC STUDIES: I have personally reviewed the radiological images as listed and agreed with the findings in the report.  CMP Latest Ref Rng & Units 07/16/2018  Glucose 70 - 99 mg/dL -  BUN 8 - 23 mg/dL -  Creatinine 0.44 - 1.00 mg/dL 1.03(H)  Sodium 135 - 145 mmol/L -  Potassium 3.5 - 5.1 mmol/L -  Chloride 98 - 111 mmol/L -  CO2 22 - 32 mmol/L -  Calcium 8.9 - 10.3 mg/dL -  Total Protein 6.5 - 8.1 g/dL -  Total Bilirubin 0.3 - 1.2 mg/dL -  Alkaline Phos 38 - 126 U/L -  AST 15 - 41 U/L -  ALT 0 - 44 U/L -   CBC Latest Ref Rng & Units 07/16/2018  WBC 4.0 - 10.5 K/uL 5.8  Hemoglobin 12.0 - 15.0 g/dL 8.9(L)  Hematocrit 36.0 - 46.0 % 29.7(L)  Platelets 150 - 400 K/uL 345    LABORATORY DATA:  I have reviewed the data as listed Lab Results  Component Value Date   WBC 5.8 07/16/2018   HGB 8.9 (L) 07/16/2018   HCT 29.7 (L) 07/16/2018   MCV 78.4 (L) 07/16/2018   PLT 345 07/16/2018   Recent Labs    12/06/17 1623 04/11/18 1632 07/01/18 2040 07/03/18 1209 07/16/18 1742  NA 143 145* 138 139  --   K 4.6 3.8 3.8 4.2  --   CL 104 104 104 104  --   CO2 24 23 27 28   --   GLUCOSE 83 77 101* 114*  --   BUN 29* 21 23 24*  --   CREATININE 1.24* 1.05* 1.10* 0.99 1.03*  CALCIUM 10.0 9.2 9.3 9.5  --   GFRNONAA 45* 55* 52* 59* 56*  GFRAA 52* 63 60* >60 >60  PROT 7.2 6.8 8.2*  --   --   ALBUMIN 4.3 4.3 4.5  --   --   AST 15 13 21   --   --   ALT 10 8 14   --   --   ALKPHOS 92  99 102  --   --   BILITOT 0.3 0.2 0.5  --   --    Iron/TIBC/Ferritin/ %Sat No results found for: IRON, TIBC, FERRITIN, IRONPCTSAT   07/25/2018 labs done at [facility] CBC showed hemoglobin 8.7, hematocrit 28.5, MCV 80.  WBC 6.6,  platelet count Chemistry showed creatinine 1.4,  ASSESSMENT & PLAN:  1. Bladder carcinoma ([place])   2. Goals of care, counseling/discussion   3. Microcytic anemia   Cancer Staging Bladder carcinoma [facility]) Staging form: Urinary Bladder, AJCC 8th Edition - Clinical stage from 08/01/2018: Stage II (cT2, cN0, cM0) - Signed by [name], MD on 08/03/2018   Clinically T2b N0M0.  Status post TURBT.High-grade urothelial carcinoma invasive into muscularis propria pain, LVI present, focal squamous differentiation in the pleomorphic sarcomatoid changes.  Carcinoma in situ was also identified. The diagnosis of bladder cancer and care plan were discussed with patient in detail.  NCCN guidelines were reviewed and shared with patient.   The goal of treatment which is to cure disease.  I recommend patient to proceed with CT chest to complete staging. We discussed about the primary treatment options including neoadjuvant chemotherapy followed by radical cystectomy.  I recommend using ddMVAC x 4 cycles regimen.  We had discussed the composition of chemotherapy regimen, length of chemo cycle, duration of treatment and the time to assess response to treatment.    I explained to the patient the risks and benefits of chemotherapy including all but not limited to infusion reaction, hair loss, hearing loss, mouth sore, nausea, vomiting, low blood counts, bleeding, heart failure, kidney failure and risk of life threatening infection and even death, secondary malignancy etc.   Patient voices understanding and willing to proceed chemotherapy.  Growth factor-Onpro Neulasta  would be given as chemotherapy-induced neutropenia to prevent febrile neutropenias. Discussed potential side effect- myalgias/arthralgias- recommend Claritin for 4 days.   #Obtain baseline 2D echo, baseline audiogram.  # Chemotherapy education; Medi- port placement. Antiemetics-Zofran and Compazine; EMLA cream sent to pharmacy  #I will obtain baseline CBC  and CMP. Reviewed previous lab results.  Patient has macrocytic anemia chronic. Will check iron, TIBC ferritin as well.  Supportive care measures are necessary for patient well-being and will be provided as necessary. We spent sufficient time to discuss many aspect of care, questions were answered to patient's satisfaction.    Orders Placed This Encounter  Procedures  . CT Chest Wo Contrast    Standing Status:   Future    Standing Expiration Date:   08/01/2019    Order Specific Question:   Preferred imaging location?    Answer:   [facility]    Order Specific Question:   Radiology Contrast Protocol - do NOT remove file path    Answer:   [email or web address]  . Ambulatory referral to Vascular Surgery    Referral Priority:   Routine    Referral Type:   Surgical    Referral Reason:   Specialty Services Required    Referred to Provider:   Algernon Huxley, MD    Requested Specialty:   Vascular Surgery    Number of Visits Requested:   1  . ECHOCARDIOGRAM COMPLETE    Standing Status:   Future    Number of Occurrences:   1    Standing Expiration Date:   10/30/2019    Order Specific Question:   Where should this test be performed    Answer:   [facility]    Order Specific Question:  Perflutren DEFINITY (image enhancing agent) should be administered unless hypersensitivity or allergy exist    Answer:   Administer Perflutren    Order Specific Question:   Reason for exam-Echo    Answer:   Chemo  V67.2 / Z09    All questions were answered. The patient knows to call the clinic with any problems questions or concerns.  Return of visit: Day 1 of chemotherapy treatments  Thank you for this kind referral and the opportunity to participate in the care of this patient. A copy of today's note is routed to referring provider  Total face to face encounter time for this patient visit was 60 min. >50% of the time was  spent in counseling and coordination of care.     [name], MD, PhD Hematology Oncology [facility] Pager- [phone] 08/01/2018

## 2018-08-03 NOTE — Progress Notes (Signed)
START ON PATHWAY REGIMEN - Bladder     A cycle is every 14 days:     Methotrexate      Vinblastine      Doxorubicin      Cisplatin      [id number]   **Always confirm dose/schedule in your pharmacy ordering system**  Patient Characteristics: Pre-Cystectomy or Nonsurgical Candidate (Clinical Staging), cT2-4a, cN0-1, M0, Cystectomy Eligible, Cisplatin-Based Chemotherapy Indicated (CrCl ? 50 mL/min and Minimal or No Symptoms) Therapeutic Status: Pre-Cystectomy or Nonsurgical Candidate (Clinical Staging) AJCC M Category: cM0 AJCC 8 Stage Grouping: II AJCC T Category: cT2 AJCC N Category: cN0 Intent of Therapy: Curative Intent, Discussed with Patient

## 2018-08-04 ENCOUNTER — Inpatient Hospital Stay: Payer: Medicare Other

## 2018-08-04 ENCOUNTER — Other Ambulatory Visit: Payer: Self-pay

## 2018-08-04 ENCOUNTER — Inpatient Hospital Stay: Payer: Medicare Other | Attending: Oncology

## 2018-08-04 DIAGNOSIS — C679 Malignant neoplasm of bladder, unspecified: Secondary | ICD-10-CM

## 2018-08-04 DIAGNOSIS — N183 Chronic kidney disease, stage 3 (moderate): Secondary | ICD-10-CM | POA: Insufficient documentation

## 2018-08-04 DIAGNOSIS — I129 Hypertensive chronic kidney disease with stage 1 through stage 4 chronic kidney disease, or unspecified chronic kidney disease: Secondary | ICD-10-CM | POA: Insufficient documentation

## 2018-08-04 DIAGNOSIS — Z7689 Persons encountering health services in other specified circumstances: Secondary | ICD-10-CM | POA: Insufficient documentation

## 2018-08-04 DIAGNOSIS — Z5111 Encounter for antineoplastic chemotherapy: Secondary | ICD-10-CM | POA: Insufficient documentation

## 2018-08-04 DIAGNOSIS — D509 Iron deficiency anemia, unspecified: Secondary | ICD-10-CM | POA: Insufficient documentation

## 2018-08-04 DIAGNOSIS — M810 Age-related osteoporosis without current pathological fracture: Secondary | ICD-10-CM | POA: Insufficient documentation

## 2018-08-04 DIAGNOSIS — E1122 Type 2 diabetes mellitus with diabetic chronic kidney disease: Secondary | ICD-10-CM | POA: Insufficient documentation

## 2018-08-04 DIAGNOSIS — Z515 Encounter for palliative care: Secondary | ICD-10-CM | POA: Insufficient documentation

## 2018-08-04 DIAGNOSIS — Z87891 Personal history of nicotine dependence: Secondary | ICD-10-CM | POA: Insufficient documentation

## 2018-08-04 DIAGNOSIS — Z7984 Long term (current) use of oral hypoglycemic drugs: Secondary | ICD-10-CM | POA: Insufficient documentation

## 2018-08-04 DIAGNOSIS — E785 Hyperlipidemia, unspecified: Secondary | ICD-10-CM | POA: Insufficient documentation

## 2018-08-04 DIAGNOSIS — Z79899 Other long term (current) drug therapy: Secondary | ICD-10-CM | POA: Insufficient documentation

## 2018-08-05 ENCOUNTER — Inpatient Hospital Stay: Payer: Medicare Other

## 2018-08-05 ENCOUNTER — Telehealth: Payer: Self-pay | Admitting: Urology

## 2018-08-05 DIAGNOSIS — Z5111 Encounter for antineoplastic chemotherapy: Secondary | ICD-10-CM | POA: Diagnosis not present

## 2018-08-05 DIAGNOSIS — I129 Hypertensive chronic kidney disease with stage 1 through stage 4 chronic kidney disease, or unspecified chronic kidney disease: Secondary | ICD-10-CM | POA: Diagnosis not present

## 2018-08-05 DIAGNOSIS — Z7984 Long term (current) use of oral hypoglycemic drugs: Secondary | ICD-10-CM | POA: Diagnosis not present

## 2018-08-05 DIAGNOSIS — C679 Malignant neoplasm of bladder, unspecified: Secondary | ICD-10-CM

## 2018-08-05 DIAGNOSIS — Z87891 Personal history of nicotine dependence: Secondary | ICD-10-CM | POA: Diagnosis not present

## 2018-08-05 DIAGNOSIS — M810 Age-related osteoporosis without current pathological fracture: Secondary | ICD-10-CM | POA: Diagnosis not present

## 2018-08-05 DIAGNOSIS — E1122 Type 2 diabetes mellitus with diabetic chronic kidney disease: Secondary | ICD-10-CM | POA: Diagnosis not present

## 2018-08-05 DIAGNOSIS — Z7689 Persons encountering health services in other specified circumstances: Secondary | ICD-10-CM | POA: Diagnosis not present

## 2018-08-05 DIAGNOSIS — E785 Hyperlipidemia, unspecified: Secondary | ICD-10-CM | POA: Diagnosis not present

## 2018-08-05 DIAGNOSIS — N183 Chronic kidney disease, stage 3 (moderate): Secondary | ICD-10-CM | POA: Diagnosis not present

## 2018-08-05 DIAGNOSIS — Z7189 Other specified counseling: Secondary | ICD-10-CM

## 2018-08-05 DIAGNOSIS — D509 Iron deficiency anemia, unspecified: Secondary | ICD-10-CM | POA: Diagnosis not present

## 2018-08-05 DIAGNOSIS — Z515 Encounter for palliative care: Secondary | ICD-10-CM | POA: Diagnosis not present

## 2018-08-05 DIAGNOSIS — Z79899 Other long term (current) drug therapy: Secondary | ICD-10-CM | POA: Diagnosis not present

## 2018-08-05 LAB — COMPREHENSIVE METABOLIC PANEL
ALT: 13 U/L (ref 0–44)
AST: 16 U/L (ref 15–41)
Albumin: 4 g/dL (ref 3.5–5.0)
Alkaline Phosphatase: 82 U/L (ref 38–126)
Anion gap: 8 (ref 5–15)
BUN: 29 mg/dL — ABNORMAL HIGH (ref 8–23)
CO2: 24 mmol/L (ref 22–32)
Calcium: 9.2 mg/dL (ref 8.9–10.3)
Chloride: 105 mmol/L (ref 98–111)
Creatinine, Ser: 1.23 mg/dL — ABNORMAL HIGH (ref 0.44–1.00)
GFR calc Af Amer: 52 mL/min — ABNORMAL LOW (ref >60)
GFR calc non Af Amer: 45 mL/min — ABNORMAL LOW (ref >60)
Glucose, Bld: 156 mg/dL — ABNORMAL HIGH (ref 70–99)
Potassium: 3.9 mmol/L (ref 3.5–5.1)
Sodium: 137 mmol/L (ref 135–145)
Total Bilirubin: 0.4 mg/dL (ref 0.3–1.2)
Total Protein: 7.7 g/dL (ref 6.5–8.1)

## 2018-08-05 LAB — CBC WITH DIFFERENTIAL/PLATELET
Abs Immature Granulocytes: 0.03 10*3/uL (ref 0.00–0.07)
Basophils Absolute: 0 10*3/uL (ref 0.0–0.1)
Basophils Relative: 0 %
Eosinophils Absolute: 0.1 10*3/uL (ref 0.0–0.5)
Eosinophils Relative: 2 %
HCT: 27.5 % — ABNORMAL LOW (ref 36.0–46.0)
Hemoglobin: 8.5 g/dL — ABNORMAL LOW (ref 12.0–15.0)
Immature Granulocytes: 1 %
Lymphocytes Relative: 32 %
Lymphs Abs: 1.5 10*3/uL (ref 0.7–4.0)
MCH: 24.1 pg — ABNORMAL LOW (ref 26.0–34.0)
MCHC: 30.9 g/dL (ref 30.0–36.0)
MCV: 78.1 fL — ABNORMAL LOW (ref 80.0–100.0)
Monocytes Absolute: 0.4 10*3/uL (ref 0.1–1.0)
Monocytes Relative: 8 %
Neutro Abs: 2.8 10*3/uL (ref 1.7–7.7)
Neutrophils Relative %: 57 %
Platelets: 292 10*3/uL (ref 150–400)
RBC: 3.52 MIL/uL — ABNORMAL LOW (ref 3.87–5.11)
RDW: 14.6 % (ref 11.5–15.5)
WBC: 4.8 10*3/uL (ref 4.0–10.5)
nRBC: 0 % (ref 0.0–0.2)

## 2018-08-05 LAB — IRON AND TIBC
Iron: 30 ug/dL (ref 28–170)
Saturation Ratios: 8 % — ABNORMAL LOW (ref 10.4–31.8)
TIBC: 372 ug/dL (ref 250–450)
UIBC: 342 ug/dL

## 2018-08-05 LAB — FERRITIN: Ferritin: 33 ng/mL (ref 11–307)

## 2018-08-05 LAB — RETIC PANEL
Immature Retic Fract: 13.9 % (ref 2.3–15.9)
RBC.: 3.52 MIL/uL — ABNORMAL LOW (ref 3.87–5.11)
Retic Count, Absolute: 55.6 10*3/uL (ref 19.0–186.0)
Retic Ct Pct: 1.6 % (ref 0.4–3.1)
Reticulocyte Hemoglobin: 25.2 pg — ABNORMAL LOW (ref >27.9)

## 2018-08-05 NOTE — Telephone Encounter (Signed)
Referral was entered and faxed  Patient needs to pick up a CD of her CT scans from the [facility] I have been trying to reach her to let her know this but her VM is full.   Sharyn Lull

## 2018-08-06 ENCOUNTER — Other Ambulatory Visit: Payer: Self-pay | Admitting: Oncology

## 2018-08-06 ENCOUNTER — Encounter
Admission: RE | Disposition: A | Discharge status: Home / Self Care | Payer: Self-pay | Source: Home / Self Care | Attending: Vascular Surgery

## 2018-08-06 ENCOUNTER — Other Ambulatory Visit (INDEPENDENT_AMBULATORY_CARE_PROVIDER_SITE_OTHER): Payer: Self-pay | Admitting: Nurse Practitioner

## 2018-08-06 ENCOUNTER — Other Ambulatory Visit: Payer: Self-pay

## 2018-08-06 ENCOUNTER — Ambulatory Visit
Admission: RE | Admit: 2018-08-06 | Discharge: 2018-08-06 | Disposition: A | Discharge status: Home / Self Care | Payer: Medicare Other | Attending: Vascular Surgery | Admitting: Vascular Surgery

## 2018-08-06 DIAGNOSIS — C679 Malignant neoplasm of bladder, unspecified: Secondary | ICD-10-CM | POA: Diagnosis not present

## 2018-08-06 DIAGNOSIS — N179 Acute kidney failure, unspecified: Secondary | ICD-10-CM | POA: Insufficient documentation

## 2018-08-06 HISTORY — PX: PORTA CATH INSERTION: CATH118285

## 2018-08-06 LAB — GLUCOSE, CAPILLARY
Glucose-Capillary: 101 mg/dL — ABNORMAL HIGH (ref 70–99)
Glucose-Capillary: 90 mg/dL (ref 70–99)

## 2018-08-06 SURGERY — PORTA CATH INSERTION
Anesthesia: Moderate Sedation

## 2018-08-06 MED ORDER — IRON-VITAMIN C 65-125 MG PO TABS: 2.0000 | ORAL_TABLET | Freq: Every day | ORAL | 3 refills | Status: DC

## 2018-08-06 MED ORDER — SODIUM CHLORIDE 0.9 % IV SOLN
Freq: Once | INTRAVENOUS | Status: DC
  Filled 2018-08-06: qty 2

## 2018-08-06 MED ORDER — HEPARIN (PORCINE) IN NACL 1000-0.9 UT/500ML-% IV SOLN
INTRAVENOUS | Status: AC
  Filled 2018-08-06: qty 500

## 2018-08-06 MED ORDER — HYDROMORPHONE HCL 1 MG/ML IJ SOLN: 1.0000 mg | Freq: Once | INTRAMUSCULAR | Status: DC | PRN

## 2018-08-06 MED ORDER — METHYLPREDNISOLONE SODIUM SUCC 125 MG IJ SOLR: 125.0000 mg | Freq: Once | INTRAMUSCULAR | Status: DC | PRN

## 2018-08-06 MED ORDER — SODIUM CHLORIDE 0.9 % IV SOLN
INTRAVENOUS | Status: DC
  Administered 2018-08-06: 11:00:00 via INTRAVENOUS

## 2018-08-06 MED ORDER — FENTANYL CITRATE (PF) 100 MCG/2ML IJ SOLN
INTRAMUSCULAR | Status: DC | PRN
  Administered 2018-08-06 (×2): 50 ug via INTRAVENOUS

## 2018-08-06 MED ORDER — MIDAZOLAM HCL 2 MG/2ML IJ SOLN
INTRAMUSCULAR | Status: AC
  Filled 2018-08-06: qty 4

## 2018-08-06 MED ORDER — MIDAZOLAM HCL 2 MG/ML PO SYRP: 8.0000 mg | ORAL_SOLUTION | Freq: Once | ORAL | Status: DC | PRN

## 2018-08-06 MED ORDER — MIDAZOLAM HCL 2 MG/2ML IJ SOLN
INTRAMUSCULAR | Status: DC | PRN
  Administered 2018-08-06 (×2): 2 mg via INTRAVENOUS

## 2018-08-06 MED ORDER — LIDOCAINE-EPINEPHRINE (PF) 1 %-1:200000 IJ SOLN
INTRAMUSCULAR | Status: AC
  Filled 2018-08-06: qty 30

## 2018-08-06 MED ORDER — FAMOTIDINE 20 MG PO TABS: 40.0000 mg | ORAL_TABLET | Freq: Once | ORAL | Status: DC | PRN

## 2018-08-06 MED ORDER — DIPHENHYDRAMINE HCL 50 MG/ML IJ SOLN: 50.0000 mg | Freq: Once | INTRAMUSCULAR | Status: DC | PRN

## 2018-08-06 MED ORDER — FENTANYL CITRATE (PF) 100 MCG/2ML IJ SOLN
INTRAMUSCULAR | Status: AC
  Filled 2018-08-06: qty 2

## 2018-08-06 MED ORDER — CEFAZOLIN SODIUM-DEXTROSE 2-4 GM/100ML-% IV SOLN
2.0000 g | Freq: Once | INTRAVENOUS | Status: AC
  Administered 2018-08-06: 2 g via INTRAVENOUS

## 2018-08-06 MED ORDER — ONDANSETRON HCL 4 MG/2ML IJ SOLN: 4.0000 mg | Freq: Four times a day (QID) | INTRAMUSCULAR | Status: DC | PRN

## 2018-08-06 SURGICAL SUPPLY — 9 items
COVER PROBE U/S 5X48 (MISCELLANEOUS) ×2 IMPLANT
KIT PORT POWER 8FR ISP CVUE (Port) ×2 IMPLANT
PACK ANGIOGRAPHY (CUSTOM PROCEDURE TRAY) ×2 IMPLANT
PAD GROUND ADULT SPLIT (MISCELLANEOUS) ×2 IMPLANT
PENCIL ELECTRO HAND CTR (MISCELLANEOUS) ×2 IMPLANT
SUT MNCRL AB 4-0 PS2 18 (SUTURE) ×2 IMPLANT
SUT PROLENE 0 CT 1 30 (SUTURE) ×2 IMPLANT
SUT VICRYL+ 3-0 36IN CT-1 (SUTURE) ×2 IMPLANT
TOWEL OR 17X26 4PK STRL BLUE (TOWEL DISPOSABLE) ×2 IMPLANT

## 2018-08-06 NOTE — Op Note (Signed)
      [facility]       Operative Note  Date: 08/06/2018  Preoperative diagnosis:  1. Bladder cancer  Postoperative diagnosis:  Same as above  Procedures: #1. Ultrasound guidance for vascular access to the right internal jugular vein. #2. Fluoroscopic guidance for placement of catheter. #3. Placement of CT compatible Port-A-Cath, right internal jugular vein.  Surgeon: Leotis Pain, MD.   Anesthesia: Local with moderate conscious sedation for approximately 30  minutes using 4 mg of Versed and 100 mcg of Fentanyl  Fluoroscopy time: less than 1 minute  Contrast used: 0  Estimated blood loss: 5 cc  Indication for the procedure:  The patient is a 68 y.o.female with bladder cancer.  The patient needs a Port-A-Cath for durable venous access, chemotherapy, lab draws, and CT scans. We are asked to place this. Risks and benefits were discussed and informed consent was obtained.  Description of procedure: The patient was brought to the vascular and interventional radiology suite.  Moderate conscious sedation was administered throughout the procedure during a face to face encounter with the patient with my supervision of the RN administering medicines and monitoring the patient's vital signs, pulse oximetry, telemetry and mental status throughout from the start of the procedure until the patient was taken to the recovery room. The right neck chest and shoulder were sterilely prepped and draped, and a sterile surgical field was created. Ultrasound was used to help visualize a patent right internal jugular vein. This was then accessed under direct ultrasound guidance without difficulty with the Seldinger needle and a permanent image was recorded. A J-wire was placed. After skin nick and dilatation, the peel-away sheath was then placed over the wire. I then anesthetized an area under the clavicle approximately 1-2 fingerbreadths. A transverse incision was created and an inferior pocket  was created with electrocautery and blunt dissection. The port was then brought onto the field, placed into the pocket and secured to the chest wall with 2 Prolene sutures. The catheter was connected to the port and tunneled from the subclavicular incision to the access site. Fluoroscopic guidance was then used to cut the catheter to an appropriate length. The catheter was then placed through the peel-away sheath and the peel-away sheath was removed. The catheter tip was parked in excellent location under fluorocoscopic guidance in the cavoatrial junction. The pocket was then irrigated with antibiotic impregnated saline and the wound was closed with a running 3-0 Vicryl and a 4-0 Monocryl. The access incision was closed with a single 4-0 Monocryl. The Huber needle was used to withdraw blood and flush the port with heparinized saline. Dermabond was then placed as a dressing. The patient tolerated the procedure well and was taken to the recovery room in stable condition.   Leotis Pain 08/06/2018 2:44 PM   This note was created with Dragon Medical transcription system. Any errors in dictation are purely unintentional.

## 2018-08-06 NOTE — Progress Notes (Signed)
Dr. Lucky Cowboy at bedside, speaking with pt. And her family re: procedure. All family and pt. Verbalized understanding of conversation. No follow up appt. Per Dr. Lucky Cowboy for port insertion.

## 2018-08-06 NOTE — H&P (Signed)
[facility] History & Physical Update  The patient was interviewed and re-examined.  The patient's previous History and Physical has been reviewed and is unchanged.  There is no change in the plan of care. We plan to proceed with the scheduled procedure.  Leotis Pain, MD  08/06/2018, 10:36 AM

## 2018-08-07 ENCOUNTER — Encounter: Payer: Self-pay | Admitting: Vascular Surgery

## 2018-08-07 ENCOUNTER — Ambulatory Visit
Admission: RE | Admit: 2018-08-07 | Discharge: 2018-08-07 | Disposition: A | Discharge status: Home / Self Care | Payer: Medicare Other | Source: Ambulatory Visit | Attending: Oncology | Admitting: Oncology

## 2018-08-07 ENCOUNTER — Other Ambulatory Visit: Payer: Self-pay

## 2018-08-07 DIAGNOSIS — C679 Malignant neoplasm of bladder, unspecified: Secondary | ICD-10-CM | POA: Diagnosis not present

## 2018-08-07 DIAGNOSIS — N179 Acute kidney failure, unspecified: Secondary | ICD-10-CM

## 2018-08-07 DIAGNOSIS — R918 Other nonspecific abnormal finding of lung field: Secondary | ICD-10-CM | POA: Diagnosis not present

## 2018-08-08 ENCOUNTER — Inpatient Hospital Stay: Payer: Medicare Other

## 2018-08-08 ENCOUNTER — Other Ambulatory Visit: Payer: Self-pay | Admitting: Oncology

## 2018-08-08 VITALS — BP 122/72 | HR 86 | Temp 98.0°F | Resp 20

## 2018-08-08 DIAGNOSIS — C679 Malignant neoplasm of bladder, unspecified: Secondary | ICD-10-CM

## 2018-08-08 DIAGNOSIS — Z515 Encounter for palliative care: Secondary | ICD-10-CM | POA: Diagnosis not present

## 2018-08-08 DIAGNOSIS — N179 Acute kidney failure, unspecified: Secondary | ICD-10-CM

## 2018-08-08 DIAGNOSIS — H903 Sensorineural hearing loss, bilateral: Secondary | ICD-10-CM | POA: Diagnosis not present

## 2018-08-08 DIAGNOSIS — D509 Iron deficiency anemia, unspecified: Secondary | ICD-10-CM | POA: Diagnosis not present

## 2018-08-08 DIAGNOSIS — Z7689 Persons encountering health services in other specified circumstances: Secondary | ICD-10-CM | POA: Diagnosis not present

## 2018-08-08 DIAGNOSIS — Z5111 Encounter for antineoplastic chemotherapy: Secondary | ICD-10-CM | POA: Diagnosis not present

## 2018-08-08 DIAGNOSIS — I129 Hypertensive chronic kidney disease with stage 1 through stage 4 chronic kidney disease, or unspecified chronic kidney disease: Secondary | ICD-10-CM | POA: Diagnosis not present

## 2018-08-08 MED ORDER — SODIUM CHLORIDE 0.9% FLUSH
10.0000 mL | Freq: Once | INTRAVENOUS | Status: AC | PRN
  Administered 2018-08-08: 10 mL
  Filled 2018-08-08: qty 10

## 2018-08-08 MED ORDER — HEPARIN SOD (PORK) LOCK FLUSH 100 UNIT/ML IV SOLN
500.0000 [IU] | Freq: Once | INTRAVENOUS | Status: AC | PRN
  Administered 2018-08-08: 500 [IU]
  Filled 2018-08-08: qty 5

## 2018-08-08 MED ORDER — SODIUM CHLORIDE 0.9 % IV SOLN
Freq: Once | INTRAVENOUS | Status: AC
  Administered 2018-08-08: 2000 mL via INTRAVENOUS
  Filled 2018-08-08: qty 250

## 2018-08-11 ENCOUNTER — Inpatient Hospital Stay: Payer: Medicare Other

## 2018-08-11 ENCOUNTER — Other Ambulatory Visit: Payer: Self-pay

## 2018-08-11 VITALS — BP 128/68 | HR 84 | Resp 18

## 2018-08-11 DIAGNOSIS — Z7689 Persons encountering health services in other specified circumstances: Secondary | ICD-10-CM | POA: Diagnosis not present

## 2018-08-11 DIAGNOSIS — D509 Iron deficiency anemia, unspecified: Secondary | ICD-10-CM | POA: Diagnosis not present

## 2018-08-11 DIAGNOSIS — I129 Hypertensive chronic kidney disease with stage 1 through stage 4 chronic kidney disease, or unspecified chronic kidney disease: Secondary | ICD-10-CM | POA: Diagnosis not present

## 2018-08-11 DIAGNOSIS — Z515 Encounter for palliative care: Secondary | ICD-10-CM | POA: Diagnosis not present

## 2018-08-11 DIAGNOSIS — C679 Malignant neoplasm of bladder, unspecified: Secondary | ICD-10-CM

## 2018-08-11 DIAGNOSIS — Z5111 Encounter for antineoplastic chemotherapy: Secondary | ICD-10-CM | POA: Diagnosis not present

## 2018-08-11 DIAGNOSIS — N179 Acute kidney failure, unspecified: Secondary | ICD-10-CM

## 2018-08-11 LAB — BASIC METABOLIC PANEL
Anion gap: 9 (ref 5–15)
BUN: 26 mg/dL — ABNORMAL HIGH (ref 8–23)
CO2: 24 mmol/L (ref 22–32)
Calcium: 8.9 mg/dL (ref 8.9–10.3)
Chloride: 106 mmol/L (ref 98–111)
Creatinine, Ser: 1.18 mg/dL — ABNORMAL HIGH (ref 0.44–1.00)
GFR calc Af Amer: 55 mL/min — ABNORMAL LOW (ref >60)
GFR calc non Af Amer: 47 mL/min — ABNORMAL LOW (ref >60)
Glucose, Bld: 96 mg/dL (ref 70–99)
Potassium: 4 mmol/L (ref 3.5–5.1)
Sodium: 139 mmol/L (ref 135–145)

## 2018-08-11 MED ORDER — HEPARIN SOD (PORK) LOCK FLUSH 100 UNIT/ML IV SOLN
500.0000 [IU] | Freq: Once | INTRAVENOUS | Status: AC
  Administered 2018-08-11: 500 [IU] via INTRAVENOUS
  Filled 2018-08-11: qty 5

## 2018-08-11 MED ORDER — SODIUM CHLORIDE 0.9 % IV SOLN
Freq: Once | INTRAVENOUS | Status: AC
  Administered 2018-08-11: 14:00:00 via INTRAVENOUS
  Filled 2018-08-11: qty 250

## 2018-08-11 MED ORDER — SODIUM CHLORIDE 0.9% FLUSH
10.0000 mL | Freq: Once | INTRAVENOUS | Status: AC
  Administered 2018-08-11: 10 mL via INTRAVENOUS
  Filled 2018-08-11: qty 10

## 2018-08-12 ENCOUNTER — Inpatient Hospital Stay: Payer: Medicare Other

## 2018-08-12 ENCOUNTER — Encounter: Payer: Self-pay | Admitting: Oncology

## 2018-08-12 ENCOUNTER — Inpatient Hospital Stay (HOSPITAL_BASED_OUTPATIENT_CLINIC_OR_DEPARTMENT_OTHER): Payer: Medicare Other | Admitting: Hospice and Palliative Medicine

## 2018-08-12 ENCOUNTER — Inpatient Hospital Stay (HOSPITAL_BASED_OUTPATIENT_CLINIC_OR_DEPARTMENT_OTHER): Payer: Medicare Other | Admitting: Oncology

## 2018-08-12 VITALS — BP 146/84 | HR 90 | Temp 96.3°F | Resp 18 | Wt 208.4 lb

## 2018-08-12 DIAGNOSIS — Z5111 Encounter for antineoplastic chemotherapy: Secondary | ICD-10-CM

## 2018-08-12 DIAGNOSIS — Z515 Encounter for palliative care: Secondary | ICD-10-CM

## 2018-08-12 DIAGNOSIS — E1122 Type 2 diabetes mellitus with diabetic chronic kidney disease: Secondary | ICD-10-CM

## 2018-08-12 DIAGNOSIS — C679 Malignant neoplasm of bladder, unspecified: Secondary | ICD-10-CM | POA: Diagnosis not present

## 2018-08-12 DIAGNOSIS — E785 Hyperlipidemia, unspecified: Secondary | ICD-10-CM

## 2018-08-12 DIAGNOSIS — N183 Chronic kidney disease, stage 3 (moderate): Secondary | ICD-10-CM

## 2018-08-12 DIAGNOSIS — Z87891 Personal history of nicotine dependence: Secondary | ICD-10-CM | POA: Diagnosis not present

## 2018-08-12 DIAGNOSIS — Z79899 Other long term (current) drug therapy: Secondary | ICD-10-CM

## 2018-08-12 DIAGNOSIS — I129 Hypertensive chronic kidney disease with stage 1 through stage 4 chronic kidney disease, or unspecified chronic kidney disease: Secondary | ICD-10-CM

## 2018-08-12 DIAGNOSIS — D509 Iron deficiency anemia, unspecified: Secondary | ICD-10-CM

## 2018-08-12 DIAGNOSIS — Z7689 Persons encountering health services in other specified circumstances: Secondary | ICD-10-CM

## 2018-08-12 DIAGNOSIS — M810 Age-related osteoporosis without current pathological fracture: Secondary | ICD-10-CM | POA: Diagnosis not present

## 2018-08-12 DIAGNOSIS — Z7984 Long term (current) use of oral hypoglycemic drugs: Secondary | ICD-10-CM | POA: Diagnosis not present

## 2018-08-12 DIAGNOSIS — D500 Iron deficiency anemia secondary to blood loss (chronic): Secondary | ICD-10-CM

## 2018-08-12 LAB — CBC WITH DIFFERENTIAL/PLATELET
Abs Immature Granulocytes: 0.02 10*3/uL (ref 0.00–0.07)
Basophils Absolute: 0 10*3/uL (ref 0.0–0.1)
Basophils Relative: 1 %
Eosinophils Absolute: 0.1 10*3/uL (ref 0.0–0.5)
Eosinophils Relative: 1 %
HCT: 27.4 % — ABNORMAL LOW (ref 36.0–46.0)
Hemoglobin: 8.5 g/dL — ABNORMAL LOW (ref 12.0–15.0)
Immature Granulocytes: 0 %
Lymphocytes Relative: 33 %
Lymphs Abs: 1.7 10*3/uL (ref 0.7–4.0)
MCH: 24.1 pg — ABNORMAL LOW (ref 26.0–34.0)
MCHC: 31 g/dL (ref 30.0–36.0)
MCV: 77.6 fL — ABNORMAL LOW (ref 80.0–100.0)
Monocytes Absolute: 0.5 10*3/uL (ref 0.1–1.0)
Monocytes Relative: 9 %
Neutro Abs: 2.9 10*3/uL (ref 1.7–7.7)
Neutrophils Relative %: 56 %
Platelets: 300 10*3/uL (ref 150–400)
RBC: 3.53 MIL/uL — ABNORMAL LOW (ref 3.87–5.11)
RDW: 14.8 % (ref 11.5–15.5)
WBC: 5.2 10*3/uL (ref 4.0–10.5)
nRBC: 0 % (ref 0.0–0.2)

## 2018-08-12 LAB — BASIC METABOLIC PANEL
Anion gap: 6 (ref 5–15)
BUN: 19 mg/dL (ref 8–23)
CO2: 25 mmol/L (ref 22–32)
Calcium: 9 mg/dL (ref 8.9–10.3)
Chloride: 109 mmol/L (ref 98–111)
Creatinine, Ser: 1.1 mg/dL — ABNORMAL HIGH (ref 0.44–1.00)
GFR calc Af Amer: 60 mL/min — ABNORMAL LOW (ref >60)
GFR calc non Af Amer: 52 mL/min — ABNORMAL LOW (ref >60)
Glucose, Bld: 122 mg/dL — ABNORMAL HIGH (ref 70–99)
Potassium: 3.8 mmol/L (ref 3.5–5.1)
Sodium: 140 mmol/L (ref 135–145)

## 2018-08-12 MED ORDER — HEPARIN SOD (PORK) LOCK FLUSH 100 UNIT/ML IV SOLN
500.0000 [IU] | Freq: Once | INTRAVENOUS | Status: AC
  Administered 2018-08-12: 500 [IU] via INTRAVENOUS

## 2018-08-12 MED ORDER — ONDANSETRON HCL 4 MG/2ML IJ SOLN
8.0000 mg | Freq: Once | INTRAMUSCULAR | Status: AC
  Administered 2018-08-12: 8 mg via INTRAVENOUS
  Filled 2018-08-12: qty 4

## 2018-08-12 MED ORDER — SODIUM CHLORIDE 0.9 % IV SOLN
Freq: Once | INTRAVENOUS | Status: AC
  Administered 2018-08-12: 10:00:00 via INTRAVENOUS
  Filled 2018-08-12: qty 250

## 2018-08-12 MED ORDER — DEXAMETHASONE SODIUM PHOSPHATE 10 MG/ML IJ SOLN
10.0000 mg | Freq: Once | INTRAMUSCULAR | Status: AC
  Administered 2018-08-12: 10 mg via INTRAVENOUS
  Filled 2018-08-12: qty 1

## 2018-08-12 MED ORDER — SODIUM CHLORIDE 0.9 % IV SOLN
30.0000 mg/m2 | Freq: Once | INTRAVENOUS | Status: AC
  Administered 2018-08-12: 61.25 mg via INTRAVENOUS
  Filled 2018-08-12: qty 2.45

## 2018-08-12 NOTE — Progress Notes (Signed)
Hematology/Oncology follow up note [facility] [phone] Fax:([phone]   Patient Care Team: Volney American, PA-C as PCP - General (Family Medicine) Idelle Leech, OD (Optometry)  REFERRING PROVIDER: Dr.Sninsky CHIEF COMPLAINTS/REASON FOR VISIT:  Evaluation of bladder cancer  HISTORY OF PRESENTING ILLNESS:  Angela Russell is a  68 y.o.  female with PMH listed below who was referred to me for evaluation of newly diagnosed bladder cancer. Patient initially presented to emergency room at the end of January 2020 for evaluation of dysuria, hematuria and the left lower quadrant inguinal pain and flank pain.  1/28 2020 CT renal stone study showed suspected irregular wall thickening about the superior bladder, not well assessed due to degree of bladder distention.  Recommend cystoscopy for further evaluation.  No renal stone or obstructive uropathy.  Patient was given IV Rocephin and referred patient for outpatient urology follow-up.  Urine culture was negative.  She again presented to ER after 2 days with similar symptoms.  07/03/2018 another CT abdomen pelvis with contrast was done which showed no nephrolithiasis or hydronephrosis is identified.  Bladder is decompressed limiting evaluation.  Patient was evaluated by urology Dr. [name] on 07/15/2018.  She underwent cystoscopy and bilateral retrograde pyelogram on 07/16/2018.  Pyelogram did not show any filling defect or abnormalities.  No hydronephrosis.  Ureteral orifice was not involved with tumor.  There is a large 5 cm posterior wall bladder tumor, bullous and sessile appearing.  Patient underwent TURBT.  Pathology: High-grade urothelial carcinoma, invasive into muscularis propria.  Lymphovascular invasion is present.  Carcinoma in situ is also identified.  Focal squamous differentiation is noted, areas of invasive carcinoma display pleomorphic/sarcomatoid changes.  07/16/2018 chest x-ray showed  normal mediastinum and cardiac silhouette.  Normal pulmonary vasculature.  No evidence of effusion, infiltrate or pneumothorax.  No acute bony abnormality.  Degenerative osteophytosis of the spine.  Patient has 25-pack-year smoking history, quit approximately 20 years ago.  No family history of any urology malignancies. Today she has accompanied by multiple family members to establish care with oncology and discuss management plan.  Today she denies any dysuria, hematuria, abdominal pain, cough, hemoptysis.  She has lost weight since her surgery.  INTERVAL HISTORY Angela Russell is a 68 y.o. female who has above history reviewed by me today presents for follow up visit for management of stage II high-grade urothelial carcinoma of the bladder. Problems and complaints are listed below: Patient has had 2D echo which showed normal LVEF, audiometry.  Her creatinine was trending up.  Had renal ultrasound done which showed no hydronephrosis.  Patient got IV fluids sessions. She has also been to chemotherapy class and also has had Mediport placed. All premedications. Today no new complaints this.   Review of Systems  Constitutional: Negative for appetite change, chills, fatigue and fever.  HENT:   Negative for hearing loss and voice change.   Eyes: Negative for eye problems.  Respiratory: Negative for chest tightness and cough.   Cardiovascular: Negative for chest pain.  Gastrointestinal: Negative for abdominal distention, abdominal pain and blood in stool.  Endocrine: Negative for hot flashes.  Genitourinary: Negative for difficulty urinating and frequency.   Musculoskeletal: Negative for arthralgias.  Skin: Negative for itching and rash.  Neurological: Negative for extremity weakness.  Hematological: Negative for adenopathy.  Psychiatric/Behavioral: Negative for confusion. The patient is nervous/anxious.     MEDICAL HISTORY:  Past Medical History:  Diagnosis Date  . Carotid artery  plaque, right 01/2014  .  CKD (chronic kidney disease)    stage 2-3  . Diabetes mellitus without complication ([place])   . DM (diabetes mellitus), type 2, uncontrolled ([place])   . Hyperlipidemia   . Hypertension   . Hypochromic microcytic anemia    mild  . Osteoporosis   . Renal insufficiency   . Rotator cuff tendonitis, right     SURGICAL HISTORY: Past Surgical History:  Procedure Laterality Date  . ABDOMINAL HYSTERECTOMY  2000   due to bleeding and fibroids, partial- still has ovaries  . CYSTOSCOPY W/ RETROGRADES Bilateral 07/16/2018   Procedure: CYSTOSCOPY WITH RETROGRADE PYELOGRAM;  Surgeon: Billey Co, MD;  Location: [facility] ORS;  Service: Urology;  Laterality: Bilateral;  . KNEE SURGERY Left 03/17/2013   torn meniscus  . PORTA CATH INSERTION N/A 08/06/2018   Procedure: PORTA CATH INSERTION;  Surgeon: Algernon Huxley, MD;  Location: [place] [facility];  Service: Cardiovascular;  Laterality: N/A;  . TRANSURETHRAL RESECTION OF BLADDER TUMOR N/A 07/16/2018   Procedure: TRANSURETHRAL RESECTION OF BLADDER TUMOR (TURBT);  Surgeon: Billey Co, MD;  Location: [facility] ORS;  Service: Urology;  Laterality: N/A;    SOCIAL HISTORY: Social History   Socioeconomic History  . Marital status: Single    Spouse name: Not on file  . Number of children: 1  . Years of education: Not on file  . Highest education level: Not on file  Occupational History  . Not on file  Social Needs  . Financial resource strain: Not hard at all  . Food insecurity:    Worry: Never true    Inability: Never true  . Transportation needs:    Medical: No    Non-medical: No  Tobacco Use  . Smoking status: Former Smoker    Last attempt to quit: 06/05/1991    Years since quitting: 27.2  . Smokeless tobacco: Never Used  Substance and Sexual Activity  . Alcohol use: No  . Drug use: No  . Sexual activity: Never  Lifestyle  . Physical activity:    Days per week: 2 days    Minutes per session: 30 min  . Stress:  Not at all  Relationships  . Social connections:    Talks on phone: More than three times a week    Gets together: More than three times a week    Attends religious service: More than 4 times per year    Active member of club or organization: Yes    Attends meetings of clubs or organizations: More than 4 times per year    Relationship status: Separated  . Intimate partner violence:    Fear of current or ex partner: No    Emotionally abused: No    Physically abused: No    Forced sexual activity: No  Other Topics Concern  . Not on file  Social History Narrative   Working full time    FAMILY HISTORY: Family History  Problem Relation Age of Onset  . Heart disease Mother   . Heart attack Mother   . Arthritis Father   . Diabetes Brother     ALLERGIES:  has No Known Allergies.  MEDICATIONS:  Current Outpatient Medications  Medication Sig Dispense Refill  . amLODipine (NORVASC) 5 MG tablet Take 1 tablet (5 mg total) by mouth daily. 90 tablet 3  . dexamethasone (DECADRON) 4 MG tablet Take 2 tablets by mouth once a day on the day after chemotherapy and then take 2 tablets two times a day for 2 days. Take  with food. 30 tablet 1  . Iron-Vitamin C 65-125 MG TABS Take 2 tablets by mouth daily. 60 tablet 3  . lidocaine-prilocaine (EMLA) cream Apply to affected area once 30 g 3  . lisinopril-hydrochlorothiazide (PRINZIDE,ZESTORETIC) 20-12.5 MG tablet Take 1 tablet by mouth 2 (two) times daily.  0  . metFORMIN (GLUCOPHAGE) 1000 MG tablet Take 1 tablet (1,000 mg total) by mouth 2 (two) times daily. 180 tablet 1  . ondansetron (ZOFRAN) 8 MG tablet Take 1 tablet (8 mg total) by mouth 2 (two) times daily as needed. Start on the third day after chemotherapy. 30 tablet 1  . pioglitazone (ACTOS) 15 MG tablet Take 15 mg by mouth daily.    . pravastatin (PRAVACHOL) 10 MG tablet Take 1 tablet (10 mg total) by mouth at bedtime. 90 tablet 1  . prochlorperazine (COMPAZINE) 10 MG tablet Take 1 tablet (10  mg total) by mouth every 6 (six) hours as needed (Nausea or vomiting). 30 tablet 1   No current facility-administered medications for this visit.    Facility-Administered Medications Ordered in Other Visits  Medication Dose Route Frequency Provider Last Rate Last Dose  . heparin lock flush 100 unit/mL  500 Units Intravenous Once [name], MD         PHYSICAL EXAMINATION: ECOG PERFORMANCE STATUS: 0 - Asymptomatic Vitals:   08/12/18 0828  BP: (!) 146/84  Pulse: 90  Resp: 18  Temp: (!) 96.3 F (35.7 C)   Filed Weights   08/12/18 0828  Weight: 208 lb 6.4 oz (94.5 kg)    Physical Exam Constitutional:      General: She is not in acute distress. HENT:     Head: Normocephalic and atraumatic.  Eyes:     General: No scleral icterus.    Pupils: Pupils are equal, round, and reactive to light.  Neck:     Musculoskeletal: Normal range of motion and neck supple.  Cardiovascular:     Rate and Rhythm: Normal rate and regular rhythm.     Heart sounds: Normal heart sounds.  Pulmonary:     Effort: Pulmonary effort is normal. No respiratory distress.     Breath sounds: No wheezing.  Abdominal:     General: Bowel sounds are normal. There is no distension.     Palpations: Abdomen is soft. There is no mass.     Tenderness: There is no abdominal tenderness.  Musculoskeletal: Normal range of motion.        General: No deformity.  Skin:    General: Skin is warm and dry.     Findings: No erythema or rash.  Neurological:     Mental Status: She is alert and oriented to person, place, and time.     Cranial Nerves: No cranial nerve deficit.     Coordination: Coordination normal.  Psychiatric:        Behavior: Behavior normal.        Thought Content: Thought content normal.     RADIOGRAPHIC STUDIES: I have personally reviewed the radiological images as listed and agreed with the findings in the report.  CMP Latest Ref Rng & Units 08/12/2018  Glucose 70 - 99 mg/dL 122(H)  BUN 8 - 23  mg/dL 19  Creatinine 0.44 - 1.00 mg/dL 1.10(H)  Sodium 135 - 145 mmol/L 140  Potassium 3.5 - 5.1 mmol/L 3.8  Chloride 98 - 111 mmol/L 109  CO2 22 - 32 mmol/L 25  Calcium 8.9 - 10.3 mg/dL 9.0  Total Protein 6.5 - 8.1 g/dL -  Total Bilirubin 0.3 - 1.2 mg/dL -  Alkaline Phos 38 - 126 U/L -  AST 15 - 41 U/L -  ALT 0 - 44 U/L -   CBC Latest Ref Rng & Units 08/12/2018  WBC 4.0 - 10.5 K/uL 5.2  Hemoglobin 12.0 - 15.0 g/dL 8.5(L)  Hematocrit 36.0 - 46.0 % 27.4(L)  Platelets 150 - 400 K/uL 300    LABORATORY DATA:  I have reviewed the data as listed Lab Results  Component Value Date   WBC 5.2 08/12/2018   HGB 8.5 (L) 08/12/2018   HCT 27.4 (L) 08/12/2018   MCV 77.6 (L) 08/12/2018   PLT 300 08/12/2018   Recent Labs    04/11/18 1632 07/01/18 2040  08/05/18 1255 08/11/18 1333 08/12/18 0803  NA 145* 138   < > 137 139 140  K 3.8 3.8   < > 3.9 4.0 3.8  CL 104 104   < > 105 106 109  CO2 23 27   < > 24 24 25   GLUCOSE 77 101*   < > 156* 96 122*  BUN 21 23   < > 29* 26* 19  CREATININE 1.05* 1.10*   < > 1.23* 1.18* 1.10*  CALCIUM 9.2 9.3   < > 9.2 8.9 9.0  GFRNONAA 55* 52*   < > 45* 47* 52*  GFRAA 63 60*   < > 52* 55* 60*  PROT 6.8 8.2*  --  7.7  --   --   ALBUMIN 4.3 4.5  --  4.0  --   --   AST 13 21  --  16  --   --   ALT 8 14  --  13  --   --   ALKPHOS 99 102  --  82  --   --   BILITOT 0.2 0.5  --  0.4  --   --    < > = values in this interval not displayed.   Iron/TIBC/Ferritin/ %Sat    Component Value Date/Time   IRON 30 08/05/2018 1255   TIBC 372 08/05/2018 1255   FERRITIN 33 08/05/2018 1255   IRONPCTSAT 8 (L) 08/05/2018 1255     07/25/2018 labs done at [facility] CBC showed hemoglobin 8.7, hematocrit 28.5, MCV 80.  WBC 6.6, platelet count Chemistry showed creatinine 1.4,  ASSESSMENT & PLAN:  1. Bladder carcinoma [facility])   Cancer Staging Bladder carcinoma [facility]) Staging form: Urinary Bladder, AJCC 8th Edition - Clinical stage from 08/01/2018:  Stage II (cT2, cN0, cM0) - Signed by [name], MD on 08/03/2018   Clinically T2b N0M0.  Status post TURBT.High-grade urothelial carcinoma invasive into muscularis propria pain, LVI present, focal squamous differentiation in the pleomorphic sarcomatoid changes.  Carcinoma in situ was also identified. Recommend neoadjuvant DD MVAC x4 cycles regimen. Patient's estimated GFR was 60 today.  On a few previous occasions, GFR between 50-60. We will split the cisplatin dose to 35 mg/m day 2 and 35 mg/m day 3.  I explained to the patient the risks and benefits of chemotherapy including all but not limited to infusion reaction, hair loss, hearing loss, mouth sore, nausea, vomiting, low blood counts, bleeding, heart failure, kidney failure, hearing loss, and risk of life threatening infection and even death, secondary malignancy etc.   Patient voices understanding and willing to proceed chemotherapy.  Growth factor-Onpro Neulasta  would be given as chemotherapy-induced neutropenia to prevent febrile neutropenias. Discussed potential side effect- myalgias/arthralgia.   # Iron deficiency anemia, continue  Vitron C. 2 tablets daily. Rx sent to Pharmacy.  We spent sufficient time to discuss many aspect of care, questions were answered to patient's satisfaction. Total face to face encounter time for this patient visit was 25 min. >50% of the time was  spent in counseling and coordination of care.     Orders Placed This Encounter  Procedures  . Comprehensive metabolic panel    Standing Status:   Standing    Number of Occurrences:   20    Standing Expiration Date:   08/12/2019  . CBC with Differential/Platelet    Standing Status:   Standing    Number of Occurrences:   20    Standing Expiration Date:   08/12/2019    All questions were answered. The patient knows to call the clinic with any problems questions or concerns.  Return of visit:1 weeks.   [name], MD, PhD Hematology Oncology [facility] Pager- [phone] 08/01/2018

## 2018-08-12 NOTE — Progress Notes (Signed)
[place]  Telephone:([phone] [phone]  Patient Care Team: Nyra Capes as PCP - General (Family Medicine) Idelle Leech, [place] (Optometry)   Name of the patient: Angela Russell  [id number]  March 12, 1951   Date of Visit: 08/12/18  Diagnosis: Stage II bladder cancer  Current Treatment: ddMVAC x 4 cycles   Reason for Visit: This patient is a 68 y.o. female who presents to chemo care clinic today for initial meeting in preparation for starting chemotherapy. I introduced the chemo care clinic and we discussed that the role of the clinic is to assist those who are at an increased risk of emergency room visits and/or complications during the course of chemotherapy treatment. We discussed that the increased risk takes into account factors such as age, performance status, and co-morbidities. We also discussed that for some, this might include barriers to care such as not having a primary care provider, lack of insurance/transportation, or not being able to afford medications. We discussed that the goal of the program is to help prevent unplanned ER visits and help reduce complications during chemotherapy. We do this by discussing specific risk factors to each individual and identifying ways that we can help improve these risk factors and reduce barriers to care.   Hematology/Oncology History:    Bladder carcinoma ([place])   08/01/2018 Cancer Staging    Staging form: Urinary Bladder, AJCC 8th Edition - Clinical stage from 08/01/2018: Stage II (cT2, cN0, cM0) - Signed by [name], MD on 08/03/2018    08/03/2018 Initial Diagnosis    Bladder carcinoma ([place])    08/11/2018 -  Chemotherapy    The patient had DOXOrubicin (ADRIAMYCIN) chemo injection 62 mg, 30 mg/m2, Intravenous,  Once, 0 of 4 cycles palonosetron (ALOXI) injection 0.25 mg, 0.25 mg, Intravenous,  Once, 0 of 4 cycles pegfilgrastim (NEULASTA ONPRO KIT) injection 6  mg, 6 mg, Subcutaneous, Once, 0 of 4 cycles methotrexate (PF) 61.25 mg in sodium chloride 0.9 % 50 mL chemo infusion, 30 mg/m2, Intravenous,  Once, 0 of 4 cycles CISplatin (PLATINOL) 71 mg in sodium chloride 0.9 % 250 mL chemo infusion, 35 mg/m2 = 71 mg (original dose ), Intravenous,  Once, 0 of 4 cycles Dose modification: 35 mg/m2 (Cycle 1, Reason: Provider Judgment) ondansetron (ZOFRAN) 8 mg, dexamethasone (DECADRON) 10 mg in sodium chloride 0.9 % 50 mL IVPB, , Intravenous,  Once, 0 of 4 cycles vinBLAStine (VELBAN) 6.1 mg in sodium chloride 0.9 % 50 mL chemo infusion, 3 mg/m2, Intravenous, Once, 0 of 4 cycles fosaprepitant (EMEND) 150 mg, dexamethasone (DECADRON) 12 mg in sodium chloride 0.9 % 145 mL IVPB, , Intravenous,  Once, 0 of 4 cycles  for chemotherapy treatment.       No Known Allergies   Past Medical History:  Diagnosis Date  . Carotid artery plaque, right 01/2014  . CKD (chronic kidney disease)    stage 2-3  . Diabetes mellitus without complication ([place])   . DM (diabetes mellitus), type 2, uncontrolled ([place])   . Hyperlipidemia   . Hypertension   . Hypochromic microcytic anemia    mild  . Osteoporosis   . Renal insufficiency   . Rotator cuff tendonitis, right      Past Surgical History:  Procedure Laterality Date  . ABDOMINAL HYSTERECTOMY  2000   due to bleeding and fibroids, partial- still has ovaries  . CYSTOSCOPY W/ RETROGRADES Bilateral 07/16/2018   Procedure: CYSTOSCOPY WITH RETROGRADE PYELOGRAM;  Surgeon: Diamantina Providence,  Herbert Seta, MD;  Location: [facility] ORS;  Service: Urology;  Laterality: Bilateral;  . KNEE SURGERY Left 03/17/2013   torn meniscus  . PORTA CATH INSERTION N/A 08/06/2018   Procedure: PORTA CATH INSERTION;  Surgeon: Algernon Huxley, MD;  Location: [place] [facility];  Service: Cardiovascular;  Laterality: N/A;  . TRANSURETHRAL RESECTION OF BLADDER TUMOR N/A 07/16/2018   Procedure: TRANSURETHRAL RESECTION OF BLADDER TUMOR (TURBT);  Surgeon: Billey Co,  MD;  Location: [facility] ORS;  Service: Urology;  Laterality: N/A;    Social History   Socioeconomic History  . Marital status: Single    Spouse name: Not on file  . Number of children: 1  . Years of education: Not on file  . Highest education level: Not on file  Occupational History  . Not on file  Social Needs  . Financial resource strain: Not hard at all  . Food insecurity:    Worry: Never true    Inability: Never true  . Transportation needs:    Medical: No    Non-medical: No  Tobacco Use  . Smoking status: Former Smoker    Last attempt to quit: 06/05/1991    Years since quitting: 27.2  . Smokeless tobacco: Never Used  Substance and Sexual Activity  . Alcohol use: No  . Drug use: No  . Sexual activity: Never  Lifestyle  . Physical activity:    Days per week: 2 days    Minutes per session: 30 min  . Stress: Not at all  Relationships  . Social connections:    Talks on phone: More than three times a week    Gets together: More than three times a week    Attends religious service: More than 4 times per year    Active member of club or organization: Yes    Attends meetings of clubs or organizations: More than 4 times per year    Relationship status: Separated  . Intimate partner violence:    Fear of current or ex partner: No    Emotionally abused: No    Physically abused: No    Forced sexual activity: No  Other Topics Concern  . Not on file  Social History Narrative   Working full time    Family History  Problem Relation Age of Onset  . Heart disease Mother   . Heart attack Mother   . Arthritis Father   . Diabetes Brother     Current Outpatient Medications  Medication Sig Dispense Refill  . amLODipine (NORVASC) 5 MG tablet Take 1 tablet (5 mg total) by mouth daily. 90 tablet 3  . dexamethasone (DECADRON) 4 MG tablet Take 2 tablets by mouth once a day on the day after chemotherapy and then take 2 tablets two times a day for 2 days. Take with food. 30 tablet 1    . Iron-Vitamin C 65-125 MG TABS Take 2 tablets by mouth daily. 60 tablet 3  . lidocaine-prilocaine (EMLA) cream Apply to affected area once 30 g 3  . lisinopril-hydrochlorothiazide (PRINZIDE,ZESTORETIC) 20-12.5 MG tablet Take 1 tablet by mouth 2 (two) times daily.  0  . metFORMIN (GLUCOPHAGE) 1000 MG tablet Take 1 tablet (1,000 mg total) by mouth 2 (two) times daily. 180 tablet 1  . ondansetron (ZOFRAN) 8 MG tablet Take 1 tablet (8 mg total) by mouth 2 (two) times daily as needed. Start on the third day after chemotherapy. 30 tablet 1  . pioglitazone (ACTOS) 15 MG tablet Take 15 mg by mouth  daily.    . pravastatin (PRAVACHOL) 10 MG tablet Take 1 tablet (10 mg total) by mouth at bedtime. 90 tablet 1  . prochlorperazine (COMPAZINE) 10 MG tablet Take 1 tablet (10 mg total) by mouth every 6 (six) hours as needed (Nausea or vomiting). 30 tablet 1   No current facility-administered medications for this visit.      PERFORMANCE STATUS (ECOG) : 0 - Asymptomatic  Review of Systems As noted above. Otherwise, a complete review of systems is negative.  Physical Exam General: NAD, frail appearing Pulmonary: Unlabored Extremities: no edema, no joint deformities Skin: no rashes Neurological: Weakness but otherwise nonfocal   Assessment and Plan:    1. Cancer: Stage II high-grade urothelial carcinoma on treatment regimen with Methotrexate, Vinblastine, Doxorubicin, Cisplatin, and pegfilgrastim-xxxx with curative intent.  Patient is followed by Dr. Tasia Catchings.  2. High Risk for ER/Hospitalization during Chemotherapy: We discussed the role of the chemo care clinic and identified patient specific risk factors. I discussed that patient was identified as high risk primarily based on: Recent ER visits and hospitalization. We also discussed the role of the Symptom Management and Palliative Care Clinics at [facility] and methods of contacting clinic/provider. She denies needing specific assistance at this time.  Current  PCP: Volney American, [facility] Admissions: 1  ED Visits: 2  Has Medicaid: No  Has Medicare: Yes  In relationship: No  Has Anemia: Yes  Has asthma: No  Has atrial fibrillation: No  Has CVD: No  Has chronic kidney disease: Yes  Has Chronic Obstructive Pulmonary Disease: No  Has Congestive Heart Failure: No  Has Connective Tissue Disorder: No  Has Depression: No  Has Diabetes: Yes  Has liver disease: No  Has Peripheral Vascular Disease: No     3. Social Determinants of Health:   Housing -patient lives in a house alone.  She denies housing insecurity  Food -denies Food insecurity at present but is concerned due to her inability to work while undergoing treatment.  Discussed resources available in the cancer center.  We will make referral to see social work  Transportation -patient has a car and drives but fears that she would not be able to transport herself to and from treatment given concerns regarding symptom burden.  Her nephew has been driving her but cannot do so every clinic visit.  Discussed a Artist and will have RN give her information.  Utilities -patient says pain for her utilities are her primary concern at this point.  She is unable to work.  Will refer her to see Barnabas Lister, [place].  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  Again, will refer to see social work  Chief of Staff -patient is not married.  She lives alone.  She has a son but says she is estranged from him.  She has nephews and nieces who are involved.  Physical Activity -patient says she is functionally independent   4. Co-morbidities Complicating Care: CKD stage II or III, diabetes, hypertension,  Patient has 2 recent ER visits on 07/01/2018 and 07/03/2018 for hematuria. She was then hospitalized  07/16/2018 to 07/17/2022 TURBT.  Patient has had no ER visits or hospitalization since her TURBT.  Has follow-up with PCP planned.  Patient denies any concerns regarding her medications.  Patient expressed understanding and was in agreement with this plan.  She also understands that She can call clinic at any time with any questions, concerns, or complaints.   A total of (15) minutes of face-to-face time was spent with this patient with greater than 50% of that time in counseling and care-coordination.   Signed by: Altha Harm, PhD, DNP, NP-C, [facility] [place] [phone] (Work Cell)

## 2018-08-12 NOTE — Progress Notes (Signed)
Patient here for first chemotherapy treatment. Pt complains of numbness to right hip.

## 2018-08-13 ENCOUNTER — Other Ambulatory Visit: Payer: Self-pay

## 2018-08-13 ENCOUNTER — Inpatient Hospital Stay: Payer: Medicare Other

## 2018-08-13 VITALS — BP 146/74 | HR 91 | Temp 97.2°F | Resp 17

## 2018-08-13 DIAGNOSIS — I129 Hypertensive chronic kidney disease with stage 1 through stage 4 chronic kidney disease, or unspecified chronic kidney disease: Secondary | ICD-10-CM | POA: Diagnosis not present

## 2018-08-13 DIAGNOSIS — Z5111 Encounter for antineoplastic chemotherapy: Secondary | ICD-10-CM | POA: Diagnosis not present

## 2018-08-13 DIAGNOSIS — C679 Malignant neoplasm of bladder, unspecified: Secondary | ICD-10-CM | POA: Diagnosis not present

## 2018-08-13 DIAGNOSIS — Z7689 Persons encountering health services in other specified circumstances: Secondary | ICD-10-CM | POA: Diagnosis not present

## 2018-08-13 DIAGNOSIS — Z515 Encounter for palliative care: Secondary | ICD-10-CM | POA: Diagnosis not present

## 2018-08-13 DIAGNOSIS — D509 Iron deficiency anemia, unspecified: Secondary | ICD-10-CM | POA: Diagnosis not present

## 2018-08-13 MED ORDER — VINBLASTINE SULFATE CHEMO INJECTION 1 MG/ML
3.0000 mg/m2 | Freq: Once | INTRAVENOUS | Status: AC
  Administered 2018-08-13: 6.1 mg via INTRAVENOUS
  Filled 2018-08-13: qty 6.1

## 2018-08-13 MED ORDER — SODIUM CHLORIDE 0.9 % IV SOLN
35.0000 mg/m2 | Freq: Once | INTRAVENOUS | Status: AC
  Administered 2018-08-13: 71 mg via INTRAVENOUS
  Filled 2018-08-13: qty 71

## 2018-08-13 MED ORDER — PALONOSETRON HCL INJECTION 0.25 MG/5ML
0.2500 mg | Freq: Once | INTRAVENOUS | Status: AC
  Administered 2018-08-13: 0.25 mg via INTRAVENOUS
  Filled 2018-08-13: qty 5

## 2018-08-13 MED ORDER — SODIUM CHLORIDE 0.9 % IV SOLN
Freq: Once | INTRAVENOUS | Status: AC
  Administered 2018-08-13: 09:00:00 via INTRAVENOUS
  Filled 2018-08-13: qty 250

## 2018-08-13 MED ORDER — SODIUM CHLORIDE 0.9 % IV SOLN
Freq: Once | INTRAVENOUS | Status: AC
  Administered 2018-08-13: 11:00:00 via INTRAVENOUS
  Filled 2018-08-13: qty 5

## 2018-08-13 MED ORDER — DOXORUBICIN HCL CHEMO IV INJECTION 2 MG/ML
30.0000 mg/m2 | Freq: Once | INTRAVENOUS | Status: AC
  Administered 2018-08-13: 62 mg via INTRAVENOUS
  Filled 2018-08-13: qty 31

## 2018-08-13 MED ORDER — HEPARIN SOD (PORK) LOCK FLUSH 100 UNIT/ML IV SOLN
500.0000 [IU] | Freq: Once | INTRAVENOUS | Status: AC | PRN
  Administered 2018-08-13: 500 [IU]
  Filled 2018-08-13: qty 5

## 2018-08-13 MED ORDER — SODIUM CHLORIDE 0.9% FLUSH
10.0000 mL | INTRAVENOUS | Status: DC | PRN
  Administered 2018-08-13: 10 mL
  Filled 2018-08-13: qty 10

## 2018-08-13 MED ORDER — POTASSIUM CHLORIDE 2 MEQ/ML IV SOLN
Freq: Once | INTRAVENOUS | Status: AC
  Administered 2018-08-13: 09:00:00 via INTRAVENOUS
  Filled 2018-08-13: qty 1000

## 2018-08-14 ENCOUNTER — Inpatient Hospital Stay: Payer: Medicare Other

## 2018-08-14 VITALS — BP 119/72 | HR 73 | Temp 97.1°F | Resp 18

## 2018-08-14 DIAGNOSIS — Z7689 Persons encountering health services in other specified circumstances: Secondary | ICD-10-CM | POA: Diagnosis not present

## 2018-08-14 DIAGNOSIS — C679 Malignant neoplasm of bladder, unspecified: Secondary | ICD-10-CM

## 2018-08-14 DIAGNOSIS — I129 Hypertensive chronic kidney disease with stage 1 through stage 4 chronic kidney disease, or unspecified chronic kidney disease: Secondary | ICD-10-CM | POA: Diagnosis not present

## 2018-08-14 DIAGNOSIS — Z5111 Encounter for antineoplastic chemotherapy: Secondary | ICD-10-CM | POA: Diagnosis not present

## 2018-08-14 DIAGNOSIS — D509 Iron deficiency anemia, unspecified: Secondary | ICD-10-CM | POA: Diagnosis not present

## 2018-08-14 DIAGNOSIS — Z515 Encounter for palliative care: Secondary | ICD-10-CM | POA: Diagnosis not present

## 2018-08-14 MED ORDER — SODIUM CHLORIDE 0.9 % IV SOLN
35.0000 mg/m2 | Freq: Once | INTRAVENOUS | Status: AC
  Administered 2018-08-14: 71 mg via INTRAVENOUS
  Filled 2018-08-14: qty 71

## 2018-08-14 MED ORDER — HEPARIN SOD (PORK) LOCK FLUSH 100 UNIT/ML IV SOLN
500.0000 [IU] | Freq: Once | INTRAVENOUS | Status: AC
  Administered 2018-08-14: 500 [IU] via INTRAVENOUS

## 2018-08-14 MED ORDER — POTASSIUM CHLORIDE 2 MEQ/ML IV SOLN
Freq: Once | INTRAVENOUS | Status: AC
  Administered 2018-08-14: 10:00:00 via INTRAVENOUS
  Filled 2018-08-14: qty 1000

## 2018-08-14 MED ORDER — SODIUM CHLORIDE 0.9 % IV SOLN
Freq: Once | INTRAVENOUS | Status: AC
  Administered 2018-08-14: 12:00:00 via INTRAVENOUS
  Filled 2018-08-14: qty 5

## 2018-08-14 MED ORDER — SODIUM CHLORIDE 0.9 % IV SOLN
Freq: Once | INTRAVENOUS | Status: AC
  Administered 2018-08-14: 10:00:00 via INTRAVENOUS
  Filled 2018-08-14: qty 250

## 2018-08-14 MED ORDER — PALONOSETRON HCL INJECTION 0.25 MG/5ML
0.2500 mg | Freq: Once | INTRAVENOUS | Status: AC
  Administered 2018-08-14: 0.25 mg via INTRAVENOUS
  Filled 2018-08-14: qty 5

## 2018-08-14 MED ORDER — PEGFILGRASTIM 6 MG/0.6ML ~~LOC~~ PSKT
6.0000 mg | PREFILLED_SYRINGE | Freq: Once | SUBCUTANEOUS | Status: AC
  Administered 2018-08-14: 6 mg via SUBCUTANEOUS
  Filled 2018-08-14: qty 0.6

## 2018-08-18 ENCOUNTER — Ambulatory Visit: Payer: Medicare Other

## 2018-08-19 ENCOUNTER — Inpatient Hospital Stay: Payer: Medicare Other | Admitting: Oncology

## 2018-08-19 ENCOUNTER — Inpatient Hospital Stay: Payer: Medicare Other

## 2018-08-25 ENCOUNTER — Other Ambulatory Visit: Payer: Self-pay

## 2018-08-26 ENCOUNTER — Inpatient Hospital Stay (HOSPITAL_BASED_OUTPATIENT_CLINIC_OR_DEPARTMENT_OTHER): Payer: Medicare Other | Admitting: Oncology

## 2018-08-26 ENCOUNTER — Inpatient Hospital Stay: Payer: Medicare Other

## 2018-08-26 ENCOUNTER — Encounter: Payer: Self-pay | Admitting: Oncology

## 2018-08-26 ENCOUNTER — Other Ambulatory Visit: Payer: Self-pay

## 2018-08-26 VITALS — BP 149/84 | HR 105 | Temp 96.5°F | Resp 18 | Wt 204.4 lb

## 2018-08-26 DIAGNOSIS — N183 Chronic kidney disease, stage 3 (moderate): Secondary | ICD-10-CM

## 2018-08-26 DIAGNOSIS — C679 Malignant neoplasm of bladder, unspecified: Secondary | ICD-10-CM

## 2018-08-26 DIAGNOSIS — D6481 Anemia due to antineoplastic chemotherapy: Secondary | ICD-10-CM

## 2018-08-26 DIAGNOSIS — M810 Age-related osteoporosis without current pathological fracture: Secondary | ICD-10-CM

## 2018-08-26 DIAGNOSIS — I129 Hypertensive chronic kidney disease with stage 1 through stage 4 chronic kidney disease, or unspecified chronic kidney disease: Secondary | ICD-10-CM

## 2018-08-26 DIAGNOSIS — D509 Iron deficiency anemia, unspecified: Secondary | ICD-10-CM

## 2018-08-26 DIAGNOSIS — Z7689 Persons encountering health services in other specified circumstances: Secondary | ICD-10-CM | POA: Diagnosis not present

## 2018-08-26 DIAGNOSIS — Z7984 Long term (current) use of oral hypoglycemic drugs: Secondary | ICD-10-CM | POA: Diagnosis not present

## 2018-08-26 DIAGNOSIS — Z515 Encounter for palliative care: Secondary | ICD-10-CM | POA: Diagnosis not present

## 2018-08-26 DIAGNOSIS — E1122 Type 2 diabetes mellitus with diabetic chronic kidney disease: Secondary | ICD-10-CM

## 2018-08-26 DIAGNOSIS — T451X5A Adverse effect of antineoplastic and immunosuppressive drugs, initial encounter: Secondary | ICD-10-CM

## 2018-08-26 DIAGNOSIS — N179 Acute kidney failure, unspecified: Secondary | ICD-10-CM

## 2018-08-26 DIAGNOSIS — E785 Hyperlipidemia, unspecified: Secondary | ICD-10-CM | POA: Diagnosis not present

## 2018-08-26 DIAGNOSIS — Z5111 Encounter for antineoplastic chemotherapy: Secondary | ICD-10-CM | POA: Diagnosis not present

## 2018-08-26 DIAGNOSIS — Z79899 Other long term (current) drug therapy: Secondary | ICD-10-CM

## 2018-08-26 DIAGNOSIS — Z87891 Personal history of nicotine dependence: Secondary | ICD-10-CM

## 2018-08-26 DIAGNOSIS — D500 Iron deficiency anemia secondary to blood loss (chronic): Secondary | ICD-10-CM | POA: Insufficient documentation

## 2018-08-26 LAB — COMPREHENSIVE METABOLIC PANEL
ALT: 13 U/L (ref 0–44)
AST: 24 U/L (ref 15–41)
Albumin: 3.5 g/dL (ref 3.5–5.0)
Alkaline Phosphatase: 108 U/L (ref 38–126)
Anion gap: 10 (ref 5–15)
BUN: 20 mg/dL (ref 8–23)
CO2: 23 mmol/L (ref 22–32)
Calcium: 8.7 mg/dL — ABNORMAL LOW (ref 8.9–10.3)
Chloride: 106 mmol/L (ref 98–111)
Creatinine, Ser: 1.9 mg/dL — ABNORMAL HIGH (ref 0.44–1.00)
GFR calc Af Amer: 31 mL/min — ABNORMAL LOW (ref >60)
GFR calc non Af Amer: 26 mL/min — ABNORMAL LOW (ref >60)
Glucose, Bld: 212 mg/dL — ABNORMAL HIGH (ref 70–99)
Potassium: 4 mmol/L (ref 3.5–5.1)
Sodium: 139 mmol/L (ref 135–145)
Total Bilirubin: 0.4 mg/dL (ref 0.3–1.2)
Total Protein: 6.7 g/dL (ref 6.5–8.1)

## 2018-08-26 LAB — CBC WITH DIFFERENTIAL/PLATELET
Abs Immature Granulocytes: 0.9 10*3/uL — ABNORMAL HIGH (ref 0.00–0.07)
Band Neutrophils: 6 %
Basophils Absolute: 0 10*3/uL (ref 0.0–0.1)
Basophils Relative: 0 %
Eosinophils Absolute: 0.3 10*3/uL (ref 0.0–0.5)
Eosinophils Relative: 2 %
HCT: 26.7 % — ABNORMAL LOW (ref 36.0–46.0)
Hemoglobin: 8.3 g/dL — ABNORMAL LOW (ref 12.0–15.0)
Lymphocytes Relative: 18 %
Lymphs Abs: 2.7 10*3/uL (ref 0.7–4.0)
MCH: 24.3 pg — ABNORMAL LOW (ref 26.0–34.0)
MCHC: 31.1 g/dL (ref 30.0–36.0)
MCV: 78.3 fL — ABNORMAL LOW (ref 80.0–100.0)
Metamyelocytes Relative: 2 %
Monocytes Absolute: 1.4 10*3/uL — ABNORMAL HIGH (ref 0.1–1.0)
Monocytes Relative: 9 %
Myelocytes: 4 %
Neutro Abs: 9.9 10*3/uL — ABNORMAL HIGH (ref 1.7–7.7)
Neutrophils Relative %: 59 %
Platelets: 194 10*3/uL (ref 150–400)
RBC: 3.41 MIL/uL — ABNORMAL LOW (ref 3.87–5.11)
RDW: 14.8 % (ref 11.5–15.5)
Smear Review: ADEQUATE
WBC: 15.2 10*3/uL — ABNORMAL HIGH (ref 4.0–10.5)
nRBC: 0.7 % — ABNORMAL HIGH (ref 0.0–0.2)

## 2018-08-26 MED ORDER — SODIUM CHLORIDE 0.9 % IV SOLN
Freq: Once | INTRAVENOUS | Status: AC
  Administered 2018-08-26: 09:00:00 via INTRAVENOUS
  Filled 2018-08-26: qty 250

## 2018-08-26 MED ORDER — SODIUM CHLORIDE 0.9% FLUSH
10.0000 mL | Freq: Once | INTRAVENOUS | Status: AC
  Administered 2018-08-26: 10 mL via INTRAVENOUS
  Filled 2018-08-26: qty 10

## 2018-08-26 MED ORDER — HEPARIN SOD (PORK) LOCK FLUSH 100 UNIT/ML IV SOLN
500.0000 [IU] | Freq: Once | INTRAVENOUS | Status: AC
  Administered 2018-08-26: 500 [IU] via INTRAVENOUS
  Filled 2018-08-26: qty 5

## 2018-08-26 NOTE — Progress Notes (Signed)
Hematology/Oncology follow up note [facility] [phone] [phone]   Patient Care Team: Volney American, PA-C as PCP - General (Family Medicine) Idelle Leech, OD (Optometry)  REFERRING PROVIDER: Dr.Sninsky CHIEF COMPLAINTS/REASON FOR VISIT:  Evaluation of bladder cancer  HISTORY OF PRESENTING ILLNESS:  Angela Russell is a  68 y.o.  female with PMH listed below who was referred to me for evaluation of newly diagnosed bladder cancer. Patient initially presented to emergency room at the end of January 2020 for evaluation of dysuria, hematuria and the left lower quadrant inguinal pain and flank pain.  1/28 2020 CT renal stone study showed suspected irregular wall thickening about the superior bladder, not well assessed due to degree of bladder distention.  Recommend cystoscopy for further evaluation.  No renal stone or obstructive uropathy.  Patient was given IV Rocephin and referred patient for outpatient urology follow-up.  Urine culture was negative.  She again presented to ER after 2 days with similar symptoms.  07/03/2018 another CT abdomen pelvis with contrast was done which showed no nephrolithiasis or hydronephrosis is identified.  Bladder is decompressed limiting evaluation.  Patient was evaluated by urology Dr. [name] on 07/15/2018.  She underwent cystoscopy and bilateral retrograde pyelogram on 07/16/2018.  Pyelogram did not show any filling defect or abnormalities.  No hydronephrosis.  Ureteral orifice was not involved with tumor.  There is a large 5 cm posterior wall bladder tumor, bullous and sessile appearing.  Patient underwent TURBT.  Pathology: High-grade urothelial carcinoma, invasive into muscularis propria.  Lymphovascular invasion is present.  Carcinoma in situ is also identified.  Focal squamous differentiation is noted, areas of invasive carcinoma display pleomorphic/sarcomatoid changes.  07/16/2018 chest x-ray showed  normal mediastinum and cardiac silhouette.  Normal pulmonary vasculature.  No evidence of effusion, infiltrate or pneumothorax.  No acute bony abnormality.  Degenerative osteophytosis of the spine.  Patient has 25-pack-year smoking history, quit approximately 20 years ago.  No family history of any urology malignancies. Today she has accompanied by multiple family members to establish care with oncology and discuss management plan.  Today she denies any dysuria, hematuria, abdominal pain, cough, hemoptysis.  She has lost weight since her surgery.   # Patient has had 2D echo which showed normal LVEF,  She also had baseline audiometry done.   INTERVAL HISTORY Angela Russell is a 68 y.o. female who has above history reviewed by me today presents for follow up visit for evaluation for stage II high-grade urothelial carcinoma of the bladder.    Management of stage II muscle invasive high-grade urothelial carcinoma of the bladder. Patient status post dose dense MVAC 2 weeks ago.  She is supposed to follow-up last week for repeat labs and she no showed for her appointments. Patient states that she did not know she has appointment last week. Reports feeling "all right" today.  Occasionally she feels dizzy. Denies any nausea, vomiting, abdominal pain, diarrhea, fever or chills.  Denies any tinnitus.  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  Neuropathy is not worse.   Review of Systems  Constitutional: Negative for appetite change, chills, fatigue and fever.  HENT:   Negative for hearing loss and voice change.   Eyes: Negative for eye problems.  Respiratory: Negative for chest tightness and cough.   Cardiovascular: Negative for chest pain.  Gastrointestinal: Negative for abdominal distention, abdominal pain and blood in stool.  Endocrine: Negative for hot flashes.  Genitourinary: Negative for  difficulty urinating and frequency.   Musculoskeletal: Negative for  arthralgias.  Skin: Negative for itching and rash.  Neurological: Negative for extremity weakness.  Hematological: Negative for adenopathy.  Psychiatric/Behavioral: Negative for confusion. The patient is not nervous/anxious.     MEDICAL HISTORY:  Past Medical History:  Diagnosis Date  . Carotid artery plaque, right 01/2014  . CKD (chronic kidney disease)    stage 2-3  . Diabetes mellitus without complication ([place])   . DM (diabetes mellitus), type 2, uncontrolled ([place])   . Hyperlipidemia   . Hypertension   . Hypochromic microcytic anemia    mild  . Osteoporosis   . Renal insufficiency   . Rotator cuff tendonitis, right     SURGICAL HISTORY: Past Surgical History:  Procedure Laterality Date  . ABDOMINAL HYSTERECTOMY  2000   due to bleeding and fibroids, partial- still has ovaries  . CYSTOSCOPY W/ RETROGRADES Bilateral 07/16/2018   Procedure: CYSTOSCOPY WITH RETROGRADE PYELOGRAM;  Surgeon: Billey Co, MD;  Location: [facility];  Service: Urology;  Laterality: Bilateral;  . KNEE SURGERY Left 03/17/2013   torn meniscus  . PORTA CATH INSERTION N/A 08/06/2018   Procedure: PORTA CATH INSERTION;  Surgeon: Algernon Huxley, MD;  Location: [facility];  Service: Cardiovascular;  Laterality: N/A;  . TRANSURETHRAL RESECTION OF BLADDER TUMOR N/A 07/16/2018   Procedure: TRANSURETHRAL RESECTION OF BLADDER TUMOR (TURBT);  Surgeon: Billey Co, MD;  Location: [facility];  Service: Urology;  Laterality: N/A;    SOCIAL HISTORY: Social History   Socioeconomic History  . Marital status: Single    Spouse name: Not on file  . Number of children: 1  . Years of education: Not on file  . Highest education level: Not on file  Occupational History  . Not on file  Social Needs  . Financial resource strain: Not hard at all  . Food insecurity:    Worry: Never true    Inability: Never true  . Transportation needs:    Medical: No    Non-medical: No  Tobacco Use  . Smoking status:  Former Smoker    Last attempt to quit: 06/05/1991    Years since quitting: 27.2  . Smokeless tobacco: Never Used  Substance and Sexual Activity  . Alcohol use: No  . Drug use: No  . Sexual activity: Never  Lifestyle  . Physical activity:    Days per week: 2 days    Minutes per session: 30 min  . Stress: Not at all  Relationships  . Social connections:    Talks on phone: More than three times a week    Gets together: More than three times a week    Attends religious service: More than 4 times per year    Active member of club or organization: Yes    Attends meetings of clubs or organizations: More than 4 times per year    Relationship status: Separated  . Intimate partner violence:    Fear of current or ex partner: No    Emotionally abused: No    Physically abused: No    Forced sexual activity: No  Other Topics Concern  . Not on file  Social History Narrative   Working full time    FAMILY HISTORY: Family History  Problem Relation Age of Onset  . Heart disease Mother   . Heart attack Mother   . Arthritis Father   . Diabetes Brother     ALLERGIES:  has No Known Allergies.  MEDICATIONS:  Current Outpatient Medications  Medication Sig Dispense Refill  . amLODipine (NORVASC) 5 MG tablet Take 1 tablet (5 mg total) by mouth daily. 90 tablet 3  . dexamethasone (DECADRON) 4 MG tablet Take 2 tablets by mouth once a day on the day after chemotherapy and then take 2 tablets two times a day for 2 days. Take with food. 30 tablet 1  . Iron-Vitamin C 65-125 MG TABS Take 2 tablets by mouth daily. 60 tablet 3  . lidocaine-prilocaine (EMLA) cream Apply to affected area once 30 g 3  . lisinopril-hydrochlorothiazide (PRINZIDE,ZESTORETIC) 20-12.5 MG tablet Take 1 tablet by mouth 2 (two) times daily.  0  . metFORMIN (GLUCOPHAGE) 1000 MG tablet Take 1 tablet (1,000 mg total) by mouth 2 (two) times daily. 180 tablet 1  . ondansetron (ZOFRAN) 8 MG tablet Take 1 tablet (8 mg total) by mouth 2  (two) times daily as needed. Start on the third day after chemotherapy. 30 tablet 1  . pioglitazone (ACTOS) 15 MG tablet Take 15 mg by mouth daily.    . pravastatin (PRAVACHOL) 10 MG tablet Take 1 tablet (10 mg total) by mouth at bedtime. 90 tablet 1  . prochlorperazine (COMPAZINE) 10 MG tablet Take 1 tablet (10 mg total) by mouth every 6 (six) hours as needed (Nausea or vomiting). 30 tablet 1   No current facility-administered medications for this visit.      PHYSICAL EXAMINATION: ECOG PERFORMANCE STATUS: 0 - Asymptomatic Vitals:   08/26/18 0839  BP: (!) 149/84  Pulse: (!) 105  Resp: 18  Temp: (!) 96.5 F (35.8 C)   Filed Weights   08/26/18 0839  Weight: 204 lb 6.4 oz (92.7 kg)    Physical Exam Constitutional:      General: She is not in acute distress. HENT:     Head: Normocephalic and atraumatic.  Eyes:     General: No scleral icterus.    Pupils: Pupils are equal, round, and reactive to light.  Neck:     Musculoskeletal: Normal range of motion and neck supple.  Cardiovascular:     Rate and Rhythm: Normal rate and regular rhythm.     Heart sounds: Normal heart sounds.  Pulmonary:     Effort: Pulmonary effort is normal. No respiratory distress.     Breath sounds: No wheezing.  Abdominal:     General: Bowel sounds are normal. There is no distension.     Palpations: Abdomen is soft. There is no mass.     Tenderness: There is no abdominal tenderness.  Musculoskeletal: Normal range of motion.        General: No deformity.  Skin:    General: Skin is warm and dry.     Findings: No erythema or rash.  Neurological:     Mental Status: She is alert and oriented to person, place, and time.     Cranial Nerves: No cranial nerve deficit.     Coordination: Coordination normal.  Psychiatric:        Behavior: Behavior normal.        Thought Content: Thought content normal.     RADIOGRAPHIC STUDIES: I have personally reviewed the radiological images as listed and agreed  with the findings in the report.  CMP Latest Ref Rng & Units 08/26/2018  Glucose 70 - 99 mg/dL 212(H)  BUN 8 - 23 mg/dL 20  Creatinine 0.44 - 1.00 mg/dL 1.90(H)  Sodium 135 - 145 mmol/L 139  Potassium 3.5 - 5.1 mmol/L 4.0  Chloride 98 -  111 mmol/L 106  CO2 22 - 32 mmol/L 23  Calcium 8.9 - 10.3 mg/dL 8.7(L)  Total Protein 6.5 - 8.1 g/dL 6.7  Total Bilirubin 0.3 - 1.2 mg/dL 0.4  Alkaline Phos 38 - 126 U/L 108  AST 15 - 41 U/L 24  ALT 0 - 44 U/L 13   CBC Latest Ref Rng & Units 08/26/2018  WBC 4.0 - 10.5 K/uL 15.2(H)  Hemoglobin 12.0 - 15.0 g/dL 8.3(L)  Hematocrit 36.0 - 46.0 % 26.7(L)  Platelets 150 - 400 K/uL 194    LABORATORY DATA:  I have reviewed the data as listed Lab Results  Component Value Date   WBC 15.2 (H) 08/26/2018   HGB 8.3 (L) 08/26/2018   HCT 26.7 (L) 08/26/2018   MCV 78.3 (L) 08/26/2018   PLT 194 08/26/2018   Recent Labs    07/01/18 2040  08/05/18 1255 08/11/18 1333 08/12/18 0803 08/26/18 0820  NA 138   < > 137 139 140 139  K 3.8   < > 3.9 4.0 3.8 4.0  CL 104   < > 105 106 109 106  CO2 27   < > 24 24 25 23   GLUCOSE 101*   < > 156* 96 122* 212*  BUN 23   < > 29* 26* 19 20  CREATININE 1.10*   < > 1.23* 1.18* 1.10* 1.90*  CALCIUM 9.3   < > 9.2 8.9 9.0 8.7*  GFRNONAA 52*   < > 45* 47* 52* 26*  GFRAA 60*   < > 52* 55* 60* 31*  PROT 8.2*  --  7.7  --   --  6.7  ALBUMIN 4.5  --  4.0  --   --  3.5  AST 21  --  16  --   --  24  ALT 14  --  13  --   --  13  ALKPHOS 102  --  82  --   --  108  BILITOT 0.5  --  0.4  --   --  0.4   < > = values in this interval not displayed.   Iron/TIBC/Ferritin/ %Sat    Component Value Date/Time   IRON 30 08/05/2018 1255   TIBC 372 08/05/2018 1255   FERRITIN 33 08/05/2018 1255   IRONPCTSAT 8 (L) 08/05/2018 1255     07/25/2018 labs done at [facility] CBC showed hemoglobin 8.7, hematocrit 28.5, MCV 80.  WBC 6.6, platelet count Chemistry showed creatinine 1.4,  ASSESSMENT & PLAN:  1. Bladder  carcinoma ([place])   2. Iron deficiency anemia due to chronic blood loss   3. AKI (acute kidney injury) ([place])   4. Anemia due to antineoplastic chemotherapy   Cancer Staging Bladder carcinoma [facility]) Staging form: Urinary Bladder, AJCC 8th Edition - Clinical stage from 08/01/2018: Stage II (cT2, cN0, cM0) - Signed by [name], MD on 08/03/2018   Clinically T2b N0M0.  Status post TURBT.High-grade urothelial carcinoma invasive into muscularis propria pain, LVI present, focal squamous differentiation in the pleomorphic sarcomatoid changes.  Carcinoma in situ was also identified.  Status post cycle 1 DD MVAC.  She no showed to her appointment last week for blood check and MD assessment. We emphasized the importance of being compliant to her medical appointments.  Today her labs showed acute decrease of her kidney function.  Estimated GFR 31, creatinine increased to 1.9. Likely she is having renal toxicities from cisplatin. Hold chemotherapy today. Proceed with daily IV fluids session with 1  L of normal saline daily for 3 days. Repeat BMP in 3 days.  We discussed about her plan of neoadjuvant chemotherapy. She is not a cisplatin candidate and has had toxicities from cis-platinum.  Switching to gemcitabine and carboplatin is an option however carboplatin is in general inferior to cisplatin. I feels that she needs to establish care with urology at tertiary center and be evaluated as soon as possible for early cystectomy, and we can proceed with adjuvant chemotherapy. Discussed with patient's urologist Dr. Diamantina Providence who will update me about her referrals to tertiary center.   #Leukocytosis.  Patient is afebrile.  Likely leukocytosis secondary to G-CSF support. # Iron deficiency anemia, continue  Vitron C. 2 tablets daily.  Continue.  Hemoglobin stable. Anemia also has a component due to chemotherapy treatments.  Orders Placed This Encounter  Procedures  . Basic metabolic panel    Standing Status:    Future    Standing Expiration Date:   08/26/2019    All questions were answered. The patient knows to call the clinic with any problems questions or concerns.  Return of visit:1 week  [name], MD, PhD Hematology Oncology [facility] Pager- [phone] 08/26/18

## 2018-08-26 NOTE — Progress Notes (Signed)
Patient here for follow up. States she gets dizzy "every now and then", but it goes away.

## 2018-08-27 ENCOUNTER — Other Ambulatory Visit: Payer: Self-pay

## 2018-08-27 ENCOUNTER — Inpatient Hospital Stay: Payer: Medicare Other

## 2018-08-27 VITALS — BP 148/77 | HR 97 | Temp 97.8°F | Resp 18

## 2018-08-27 DIAGNOSIS — Z7689 Persons encountering health services in other specified circumstances: Secondary | ICD-10-CM | POA: Diagnosis not present

## 2018-08-27 DIAGNOSIS — Z515 Encounter for palliative care: Secondary | ICD-10-CM | POA: Diagnosis not present

## 2018-08-27 DIAGNOSIS — D509 Iron deficiency anemia, unspecified: Secondary | ICD-10-CM | POA: Diagnosis not present

## 2018-08-27 DIAGNOSIS — C679 Malignant neoplasm of bladder, unspecified: Secondary | ICD-10-CM

## 2018-08-27 DIAGNOSIS — N179 Acute kidney failure, unspecified: Secondary | ICD-10-CM

## 2018-08-27 DIAGNOSIS — I129 Hypertensive chronic kidney disease with stage 1 through stage 4 chronic kidney disease, or unspecified chronic kidney disease: Secondary | ICD-10-CM | POA: Diagnosis not present

## 2018-08-27 DIAGNOSIS — Z5111 Encounter for antineoplastic chemotherapy: Secondary | ICD-10-CM | POA: Diagnosis not present

## 2018-08-27 MED ORDER — SODIUM CHLORIDE 0.9 % IV SOLN
Freq: Once | INTRAVENOUS | Status: AC
  Administered 2018-08-27: 13:00:00 via INTRAVENOUS
  Filled 2018-08-27: qty 250

## 2018-08-27 MED ORDER — SODIUM CHLORIDE 0.9% FLUSH
10.0000 mL | Freq: Once | INTRAVENOUS | Status: AC
  Administered 2018-08-27: 10 mL via INTRAVENOUS
  Filled 2018-08-27: qty 10

## 2018-08-27 MED ORDER — HEPARIN SOD (PORK) LOCK FLUSH 100 UNIT/ML IV SOLN
500.0000 [IU] | Freq: Once | INTRAVENOUS | Status: AC | PRN
  Administered 2018-08-27: 500 [IU]
  Filled 2018-08-27: qty 5

## 2018-08-28 ENCOUNTER — Inpatient Hospital Stay: Payer: Medicare Other

## 2018-08-28 ENCOUNTER — Telehealth: Payer: Self-pay

## 2018-08-28 ENCOUNTER — Other Ambulatory Visit: Payer: Self-pay

## 2018-08-28 VITALS — BP 151/81 | HR 99 | Temp 98.0°F | Resp 19

## 2018-08-28 DIAGNOSIS — D509 Iron deficiency anemia, unspecified: Secondary | ICD-10-CM | POA: Diagnosis not present

## 2018-08-28 DIAGNOSIS — N179 Acute kidney failure, unspecified: Secondary | ICD-10-CM

## 2018-08-28 DIAGNOSIS — Z515 Encounter for palliative care: Secondary | ICD-10-CM | POA: Diagnosis not present

## 2018-08-28 DIAGNOSIS — Z7689 Persons encountering health services in other specified circumstances: Secondary | ICD-10-CM | POA: Diagnosis not present

## 2018-08-28 DIAGNOSIS — Z5111 Encounter for antineoplastic chemotherapy: Secondary | ICD-10-CM | POA: Diagnosis not present

## 2018-08-28 DIAGNOSIS — C679 Malignant neoplasm of bladder, unspecified: Secondary | ICD-10-CM

## 2018-08-28 DIAGNOSIS — I129 Hypertensive chronic kidney disease with stage 1 through stage 4 chronic kidney disease, or unspecified chronic kidney disease: Secondary | ICD-10-CM | POA: Diagnosis not present

## 2018-08-28 LAB — BASIC METABOLIC PANEL
Anion gap: 9 (ref 5–15)
BUN: 14 mg/dL (ref 8–23)
CO2: 23 mmol/L (ref 22–32)
Calcium: 8.5 mg/dL — ABNORMAL LOW (ref 8.9–10.3)
Chloride: 107 mmol/L (ref 98–111)
Creatinine, Ser: 1.58 mg/dL — ABNORMAL HIGH (ref 0.44–1.00)
GFR calc Af Amer: 38 mL/min — ABNORMAL LOW (ref >60)
GFR calc non Af Amer: 33 mL/min — ABNORMAL LOW (ref >60)
Glucose, Bld: 101 mg/dL — ABNORMAL HIGH (ref 70–99)
Potassium: 3.8 mmol/L (ref 3.5–5.1)
Sodium: 139 mmol/L (ref 135–145)

## 2018-08-28 MED ORDER — SODIUM CHLORIDE 0.9 % IV SOLN
Freq: Once | INTRAVENOUS | Status: AC
  Administered 2018-08-28: 14:00:00 via INTRAVENOUS
  Filled 2018-08-28: qty 250

## 2018-08-28 MED ORDER — SODIUM CHLORIDE 0.9% FLUSH
10.0000 mL | Freq: Once | INTRAVENOUS | Status: AC | PRN
  Administered 2018-08-28: 10 mL
  Filled 2018-08-28: qty 10

## 2018-08-28 MED ORDER — HEPARIN SOD (PORK) LOCK FLUSH 100 UNIT/ML IV SOLN
500.0000 [IU] | Freq: Once | INTRAVENOUS | Status: AC | PRN
  Administered 2018-08-28: 500 [IU]
  Filled 2018-08-28: qty 5

## 2018-08-28 NOTE — Telephone Encounter (Signed)
Patient has appt to see Dr Matilde Haymaker [facility]) on 6/5. [facility] and spoke with Phineas Real to request earlier appointment and she said that this was the earliest appt he had due to coronavirus. However, she did send email to Dr. Matilde Haymaker to see if he could see her sooner. Gave her my phone number and office number to contact me with update. Will follow up and call back if no answer by tomorrow. Dr. Tasia Catchings notified.

## 2018-08-29 ENCOUNTER — Telehealth: Payer: Self-pay

## 2018-08-29 ENCOUNTER — Other Ambulatory Visit: Payer: Self-pay

## 2018-08-29 ENCOUNTER — Telehealth: Payer: Self-pay | Admitting: Oncology

## 2018-08-29 ENCOUNTER — Telehealth: Payer: Self-pay | Admitting: Urology

## 2018-08-29 ENCOUNTER — Other Ambulatory Visit: Payer: Self-pay | Admitting: Oncology

## 2018-08-29 DIAGNOSIS — N179 Acute kidney failure, unspecified: Secondary | ICD-10-CM

## 2018-08-29 NOTE — Telephone Encounter (Signed)
I called [facility] u[facility] Dr. Bradly Chris and discussed with him about patient's case. Patient could not tolerate cisplatin.  I recommend early surgical resection followed by chemotherapy adjuvantly. Dr.Gingrich agrees and will move up patient's appointment with [facility] urology.  I called patient and discussed with her over the phone about the change of plan. She will not have additional chemotherapy and should anticipate a call from [facility] urology She will be scheduled to come to cancer center next week for labs and recheck her kidney function and for possible IV fluid if indicated.  She voices understanding the plan.

## 2018-08-29 NOTE — Telephone Encounter (Signed)
Thank you so much!!!  Angela Russell

## 2018-08-29 NOTE — Telephone Encounter (Signed)
[facility] and spoke to [place] regarding update on patient's upcoming visit with Dr. Matilde Haymaker. She states she has not heard back from Dr. Matilde Haymaker. I Gave her Dr. Collie Siad number so Dr. Matilde Haymaker can contact her, per Dr. Tasia Catchings request.

## 2018-08-29 NOTE — Telephone Encounter (Signed)
Patient has an app at [facility] on June 5th   [facility]

## 2018-09-01 ENCOUNTER — Other Ambulatory Visit: Payer: Self-pay

## 2018-09-01 ENCOUNTER — Inpatient Hospital Stay: Payer: Medicare Other

## 2018-09-02 ENCOUNTER — Inpatient Hospital Stay: Payer: Medicare Other | Admitting: Oncology

## 2018-09-02 ENCOUNTER — Inpatient Hospital Stay: Payer: Medicare Other

## 2018-09-05 DIAGNOSIS — C674 Malignant neoplasm of posterior wall of bladder: Secondary | ICD-10-CM | POA: Diagnosis not present

## 2018-09-05 DIAGNOSIS — Z906 Acquired absence of other parts of urinary tract: Secondary | ICD-10-CM | POA: Diagnosis not present

## 2018-09-15 DIAGNOSIS — C679 Malignant neoplasm of bladder, unspecified: Secondary | ICD-10-CM | POA: Diagnosis not present

## 2018-09-15 DIAGNOSIS — C674 Malignant neoplasm of posterior wall of bladder: Secondary | ICD-10-CM | POA: Diagnosis not present

## 2018-09-16 ENCOUNTER — Telehealth: Payer: Self-pay | Admitting: Family Medicine

## 2018-09-16 NOTE — Telephone Encounter (Signed)
Copied from [place] [id number]. Topic: Quick Communication - Rx Refill/Question >> Sep 16, 2018 11:50 AM Sheran Luz wrote: Medication:lisinopril-hydrochlorothiazide (PRINZIDE,ZESTORETIC) 20-12.5 MG tablet  Patient is requesting a refill of this medication.   Preferred Pharmacy (with phone number or street name):[facility] (N), [place] - [place]  [phone] (Phone) [phone] (Fax)

## 2018-09-17 MED ORDER — LISINOPRIL-HYDROCHLOROTHIAZIDE 20-12.5 MG PO TABS: 1.0000 | ORAL_TABLET | Freq: Every day | ORAL | 0 refills | Status: DC

## 2018-09-17 NOTE — Telephone Encounter (Signed)
Pt scheduled for in office visit tomorrow at 11am.

## 2018-09-17 NOTE — Telephone Encounter (Signed)
2 months overdue for f/u, will bridge 1 month but will need OV scheduled as soon as possible

## 2018-09-18 ENCOUNTER — Telehealth: Payer: Self-pay

## 2018-09-18 ENCOUNTER — Encounter: Payer: Self-pay | Admitting: Family Medicine

## 2018-09-18 ENCOUNTER — Ambulatory Visit (INDEPENDENT_AMBULATORY_CARE_PROVIDER_SITE_OTHER): Payer: Medicare Other | Admitting: Family Medicine

## 2018-09-18 ENCOUNTER — Other Ambulatory Visit: Payer: Self-pay

## 2018-09-18 VITALS — BP 150/71 | HR 103 | Temp 98.2°F | Ht 63.5 in | Wt 204.0 lb

## 2018-09-18 DIAGNOSIS — I10 Essential (primary) hypertension: Secondary | ICD-10-CM

## 2018-09-18 DIAGNOSIS — I152 Hypertension secondary to endocrine disorders: Secondary | ICD-10-CM

## 2018-09-18 DIAGNOSIS — E1159 Type 2 diabetes mellitus with other circulatory complications: Secondary | ICD-10-CM | POA: Diagnosis not present

## 2018-09-18 MED ORDER — LISINOPRIL-HYDROCHLOROTHIAZIDE 20-12.5 MG PO TABS: 1.0000 | ORAL_TABLET | Freq: Every day | ORAL | 0 refills | Status: DC

## 2018-09-18 NOTE — Telephone Encounter (Signed)
Called and spoke to patient. Pt stated that she will give [place] a call  let you know when she is ready so that you can place a new order for cologuard.

## 2018-09-18 NOTE — Progress Notes (Signed)
BP (!) 150/71   Pulse (!) 103   Temp 98.2 F (36.8 C) (Oral)   Ht 5' 3.5" (1.613 m)   Wt 204 lb (92.5 kg)   SpO2 99%   BMI 35.57 kg/m    Subjective:    Patient ID: Angela Russell, female    DOB: 1951-02-08, 68 y.o.   MRN: [id number]  HPI: Angela Russell is a 68 y.o. female  Chief Complaint  Patient presents with  . Hypertension    lisinopril refill. pt states she has been out of lisinopril about a week ago   Here today for HTN f/u. Has been out of lisinopril for about a week now but taking the amlodipine faithfully. Both home monitors for her BP are broken so not checking home readings. Denies side effects with her medicines, CP, SOB,dizziness. Has a lot of stress right now - going through bladder cancer txs with surgery scheduled for Monday and dealing with COVID 19 crisis. Feels this is contributing to worse BPs than usual.   Relevant past medical, surgical, family and social history reviewed and updated as indicated. Interim medical history since our last visit reviewed. Allergies and medications reviewed and updated.  Review of Systems  Per HPI unless specifically indicated above     Objective:    BP (!) 150/71   Pulse (!) 103   Temp 98.2 F (36.8 C) (Oral)   Ht 5' 3.5" (1.613 m)   Wt 204 lb (92.5 kg)   SpO2 99%   BMI 35.57 kg/m   Wt Readings from Last 3 Encounters:  09/18/18 204 lb (92.5 kg)  08/26/18 204 lb 6.4 oz (92.7 kg)  08/12/18 208 lb 6.4 oz (94.5 kg)    Physical Exam Vitals signs and nursing note reviewed.  Constitutional:      Appearance: Normal appearance. She is not ill-appearing.  HENT:     Head: Atraumatic.  Eyes:     Extraocular Movements: Extraocular movements intact.     Conjunctiva/sclera: Conjunctivae normal.  Neck:     Musculoskeletal: Normal range of motion and neck supple.  Cardiovascular:     Rate and Rhythm: Normal rate and regular rhythm.     Heart sounds: Normal heart sounds.  Pulmonary:     Effort: Pulmonary effort is  normal.     Breath sounds: Normal breath sounds.  Musculoskeletal: Normal range of motion.  Skin:    General: Skin is warm and dry.  Neurological:     Mental Status: She is alert and oriented to person, place, and time.  Psychiatric:        Mood and Affect: Mood normal.        Thought Content: Thought content normal.        Judgment: Judgment normal.     Results for orders placed or performed in visit on 88/50/27  Basic metabolic panel  Result Value Ref Range   Sodium 139 135 - 145 mmol/L   Potassium 3.8 3.5 - 5.1 mmol/L   Chloride 107 98 - 111 mmol/L   CO2 23 22 - 32 mmol/L   Glucose, Bld 101 (H) 70 - 99 mg/dL   BUN 14 8 - 23 mg/dL   Creatinine, Ser 1.58 (H) 0.44 - 1.00 mg/dL   Calcium 8.5 (L) 8.9 - 10.3 mg/dL   GFR calc non Af Amer 33 (L) >60 mL/min   GFR calc Af Amer 38 (L) >60 mL/min   Anion gap 9 5 - 15  Assessment & Plan:   Problem List Items Addressed This Visit      Cardiovascular and Mediastinum   Hypertension associated with diabetes ([place]) - Primary    Above goal, but out of lisinopril and significantly stressed. Will restart lisinopril,continue amlodipine and check back in 2 weeks to make sure BP has normalized. Script sent for new BP monitor so she can log home readings, call with persistent abnormal readings. Recently had labs drawn including BMP which was benign. Will refrain from drawing more today. Pt would prefer to do full panel of labs in 3 months once surgery is behind her          Follow up plan: Return in about 2 weeks (around 10/02/2018) for BP f/u, 6 month f/u in 3 months.

## 2018-09-19 ENCOUNTER — Telehealth: Payer: Self-pay | Admitting: Family Medicine

## 2018-09-19 ENCOUNTER — Other Ambulatory Visit: Payer: Self-pay | Admitting: Family Medicine

## 2018-09-19 MED ORDER — LISINOPRIL-HYDROCHLOROTHIAZIDE 20-12.5 MG PO TABS: 1.0000 | ORAL_TABLET | Freq: Two times a day (BID) | ORAL | 1 refills | Status: DC

## 2018-09-19 NOTE — Telephone Encounter (Signed)
Please call her pharmacy and cancel the two previous scripts for lisinopril HCTZ with instructions for once daily dosing and keep the BID dosing one I just sent - I changed the quantity yesterday but not the instructions I just noticed

## 2018-09-19 NOTE — Telephone Encounter (Signed)
Medication cancelled 

## 2018-09-22 DIAGNOSIS — D649 Anemia, unspecified: Secondary | ICD-10-CM | POA: Diagnosis present

## 2018-09-22 DIAGNOSIS — G8918 Other acute postprocedural pain: Secondary | ICD-10-CM | POA: Diagnosis not present

## 2018-09-22 DIAGNOSIS — I951 Orthostatic hypotension: Secondary | ICD-10-CM | POA: Diagnosis not present

## 2018-09-22 DIAGNOSIS — R000 Tachycardia, unspecified: Secondary | ICD-10-CM | POA: Diagnosis not present

## 2018-09-22 DIAGNOSIS — R1110 Vomiting, unspecified: Secondary | ICD-10-CM | POA: Diagnosis not present

## 2018-09-22 DIAGNOSIS — I272 Pulmonary hypertension, unspecified: Secondary | ICD-10-CM | POA: Diagnosis present

## 2018-09-22 DIAGNOSIS — Z7984 Long term (current) use of oral hypoglycemic drugs: Secondary | ICD-10-CM | POA: Diagnosis not present

## 2018-09-22 DIAGNOSIS — E1122 Type 2 diabetes mellitus with diabetic chronic kidney disease: Secondary | ICD-10-CM | POA: Diagnosis present

## 2018-09-22 DIAGNOSIS — Z1159 Encounter for screening for other viral diseases: Secondary | ICD-10-CM | POA: Diagnosis not present

## 2018-09-22 DIAGNOSIS — Z90710 Acquired absence of both cervix and uterus: Secondary | ICD-10-CM | POA: Diagnosis not present

## 2018-09-22 DIAGNOSIS — E860 Dehydration: Secondary | ICD-10-CM | POA: Diagnosis not present

## 2018-09-22 DIAGNOSIS — K660 Peritoneal adhesions (postprocedural) (postinfection): Secondary | ICD-10-CM | POA: Diagnosis not present

## 2018-09-22 DIAGNOSIS — I129 Hypertensive chronic kidney disease with stage 1 through stage 4 chronic kidney disease, or unspecified chronic kidney disease: Secondary | ICD-10-CM | POA: Diagnosis present

## 2018-09-22 DIAGNOSIS — N179 Acute kidney failure, unspecified: Secondary | ICD-10-CM | POA: Diagnosis present

## 2018-09-22 DIAGNOSIS — C678 Malignant neoplasm of overlapping sites of bladder: Secondary | ICD-10-CM | POA: Diagnosis not present

## 2018-09-22 DIAGNOSIS — E782 Mixed hyperlipidemia: Secondary | ICD-10-CM | POA: Diagnosis not present

## 2018-09-22 DIAGNOSIS — N189 Chronic kidney disease, unspecified: Secondary | ICD-10-CM | POA: Diagnosis present

## 2018-09-22 DIAGNOSIS — R509 Fever, unspecified: Secondary | ICD-10-CM | POA: Diagnosis not present

## 2018-09-22 NOTE — Assessment & Plan Note (Addendum)
Above goal, but out of lisinopril and significantly stressed. Will restart lisinopril,continue amlodipine and check back in 2 weeks to make sure BP has normalized. Script sent for new BP monitor so she can log home readings, call with persistent abnormal readings. Recently had labs drawn including BMP which was benign. Will refrain from drawing more today. Pt would prefer to do full panel of labs in 3 months once surgery is behind her

## 2018-10-01 DIAGNOSIS — D630 Anemia in neoplastic disease: Secondary | ICD-10-CM | POA: Diagnosis not present

## 2018-10-01 DIAGNOSIS — C678 Malignant neoplasm of overlapping sites of bladder: Secondary | ICD-10-CM | POA: Diagnosis not present

## 2018-10-01 DIAGNOSIS — I6529 Occlusion and stenosis of unspecified carotid artery: Secondary | ICD-10-CM | POA: Diagnosis not present

## 2018-10-01 DIAGNOSIS — E1122 Type 2 diabetes mellitus with diabetic chronic kidney disease: Secondary | ICD-10-CM | POA: Diagnosis not present

## 2018-10-01 DIAGNOSIS — I129 Hypertensive chronic kidney disease with stage 1 through stage 4 chronic kidney disease, or unspecified chronic kidney disease: Secondary | ICD-10-CM | POA: Diagnosis not present

## 2018-10-01 DIAGNOSIS — Z9181 History of falling: Secondary | ICD-10-CM | POA: Diagnosis not present

## 2018-10-01 DIAGNOSIS — N189 Chronic kidney disease, unspecified: Secondary | ICD-10-CM | POA: Diagnosis not present

## 2018-10-01 DIAGNOSIS — Z7984 Long term (current) use of oral hypoglycemic drugs: Secondary | ICD-10-CM | POA: Diagnosis not present

## 2018-10-09 DIAGNOSIS — I6529 Occlusion and stenosis of unspecified carotid artery: Secondary | ICD-10-CM | POA: Diagnosis not present

## 2018-10-09 DIAGNOSIS — D630 Anemia in neoplastic disease: Secondary | ICD-10-CM | POA: Diagnosis not present

## 2018-10-09 DIAGNOSIS — C678 Malignant neoplasm of overlapping sites of bladder: Secondary | ICD-10-CM | POA: Diagnosis not present

## 2018-10-09 DIAGNOSIS — I129 Hypertensive chronic kidney disease with stage 1 through stage 4 chronic kidney disease, or unspecified chronic kidney disease: Secondary | ICD-10-CM | POA: Diagnosis not present

## 2018-10-09 DIAGNOSIS — E1122 Type 2 diabetes mellitus with diabetic chronic kidney disease: Secondary | ICD-10-CM | POA: Diagnosis not present

## 2018-10-09 DIAGNOSIS — N189 Chronic kidney disease, unspecified: Secondary | ICD-10-CM | POA: Diagnosis not present

## 2018-10-10 DIAGNOSIS — T454X5A Adverse effect of iron and its compounds, initial encounter: Secondary | ICD-10-CM | POA: Diagnosis not present

## 2018-10-10 DIAGNOSIS — K5903 Drug induced constipation: Secondary | ICD-10-CM | POA: Diagnosis not present

## 2018-10-10 DIAGNOSIS — Z960 Presence of urogenital implants: Secondary | ICD-10-CM | POA: Diagnosis not present

## 2018-10-10 DIAGNOSIS — Z906 Acquired absence of other parts of urinary tract: Secondary | ICD-10-CM | POA: Diagnosis not present

## 2018-10-10 DIAGNOSIS — C674 Malignant neoplasm of posterior wall of bladder: Secondary | ICD-10-CM | POA: Diagnosis not present

## 2018-10-10 DIAGNOSIS — N898 Other specified noninflammatory disorders of vagina: Secondary | ICD-10-CM | POA: Diagnosis not present

## 2018-10-13 ENCOUNTER — Emergency Department: Payer: Medicare Other

## 2018-10-13 ENCOUNTER — Encounter: Payer: Self-pay | Admitting: Emergency Medicine

## 2018-10-13 ENCOUNTER — Emergency Department
Admission: EM | Admit: 2018-10-13 | Discharge: 2018-10-14 | Disposition: A | Discharge status: Other Acute Inpatient Hospital | Payer: Medicare Other | Attending: Student in an Organized Health Care Education/Training Program | Admitting: Student in an Organized Health Care Education/Training Program

## 2018-10-13 ENCOUNTER — Other Ambulatory Visit: Payer: Self-pay

## 2018-10-13 DIAGNOSIS — N179 Acute kidney failure, unspecified: Secondary | ICD-10-CM | POA: Insufficient documentation

## 2018-10-13 DIAGNOSIS — Z1159 Encounter for screening for other viral diseases: Secondary | ICD-10-CM | POA: Diagnosis not present

## 2018-10-13 DIAGNOSIS — R6520 Severe sepsis without septic shock: Secondary | ICD-10-CM

## 2018-10-13 DIAGNOSIS — I129 Hypertensive chronic kidney disease with stage 1 through stage 4 chronic kidney disease, or unspecified chronic kidney disease: Secondary | ICD-10-CM | POA: Diagnosis not present

## 2018-10-13 DIAGNOSIS — A419 Sepsis, unspecified organism: Secondary | ICD-10-CM | POA: Diagnosis not present

## 2018-10-13 DIAGNOSIS — D090 Carcinoma in situ of bladder: Secondary | ICD-10-CM | POA: Diagnosis not present

## 2018-10-13 DIAGNOSIS — N12 Tubulo-interstitial nephritis, not specified as acute or chronic: Secondary | ICD-10-CM

## 2018-10-13 DIAGNOSIS — R509 Fever, unspecified: Secondary | ICD-10-CM | POA: Diagnosis not present

## 2018-10-13 DIAGNOSIS — R000 Tachycardia, unspecified: Secondary | ICD-10-CM | POA: Diagnosis not present

## 2018-10-13 DIAGNOSIS — R6521 Severe sepsis with septic shock: Secondary | ICD-10-CM | POA: Diagnosis not present

## 2018-10-13 DIAGNOSIS — Z87891 Personal history of nicotine dependence: Secondary | ICD-10-CM | POA: Insufficient documentation

## 2018-10-13 DIAGNOSIS — Z7984 Long term (current) use of oral hypoglycemic drugs: Secondary | ICD-10-CM | POA: Insufficient documentation

## 2018-10-13 DIAGNOSIS — E119 Type 2 diabetes mellitus without complications: Secondary | ICD-10-CM | POA: Insufficient documentation

## 2018-10-13 DIAGNOSIS — N182 Chronic kidney disease, stage 2 (mild): Secondary | ICD-10-CM | POA: Insufficient documentation

## 2018-10-13 DIAGNOSIS — N10 Acute tubulo-interstitial nephritis: Secondary | ICD-10-CM | POA: Insufficient documentation

## 2018-10-13 DIAGNOSIS — R918 Other nonspecific abnormal finding of lung field: Secondary | ICD-10-CM | POA: Diagnosis not present

## 2018-10-13 DIAGNOSIS — Z79899 Other long term (current) drug therapy: Secondary | ICD-10-CM | POA: Insufficient documentation

## 2018-10-13 DIAGNOSIS — E161 Other hypoglycemia: Secondary | ICD-10-CM | POA: Diagnosis not present

## 2018-10-13 DIAGNOSIS — E162 Hypoglycemia, unspecified: Secondary | ICD-10-CM | POA: Diagnosis not present

## 2018-10-13 DIAGNOSIS — R5082 Postprocedural fever: Secondary | ICD-10-CM | POA: Diagnosis present

## 2018-10-13 DIAGNOSIS — N1330 Unspecified hydronephrosis: Secondary | ICD-10-CM | POA: Diagnosis not present

## 2018-10-13 DIAGNOSIS — I959 Hypotension, unspecified: Secondary | ICD-10-CM | POA: Diagnosis not present

## 2018-10-13 DIAGNOSIS — Z20828 Contact with and (suspected) exposure to other viral communicable diseases: Secondary | ICD-10-CM | POA: Diagnosis not present

## 2018-10-13 LAB — URINALYSIS, COMPLETE (UACMP) WITH MICROSCOPIC
Bacteria, UA: NONE SEEN
Bilirubin Urine: NEGATIVE
Glucose, UA: NEGATIVE mg/dL
Ketones, ur: NEGATIVE mg/dL
Nitrite: NEGATIVE
Protein, ur: 100 mg/dL — AB
RBC / HPF: >50 RBC/hpf — ABNORMAL HIGH (ref 0–5)
Specific Gravity, Urine: 1.011 (ref 1.005–1.030)
Squamous Epithelial / HPF: NONE SEEN (ref 0–5)
WBC, UA: >50 WBC/hpf — ABNORMAL HIGH (ref 0–5)
pH: 8 (ref 5.0–8.0)

## 2018-10-13 LAB — CBC WITH DIFFERENTIAL/PLATELET
Abs Immature Granulocytes: 0.1 10*3/uL — ABNORMAL HIGH (ref 0.00–0.07)
Basophils Absolute: 0 10*3/uL (ref 0.0–0.1)
Basophils Relative: 0 %
Eosinophils Absolute: 0 10*3/uL (ref 0.0–0.5)
Eosinophils Relative: 0 %
HCT: 21 % — ABNORMAL LOW (ref 36.0–46.0)
Hemoglobin: 6.8 g/dL — ABNORMAL LOW (ref 12.0–15.0)
Immature Granulocytes: 1 %
Lymphocytes Relative: 2 %
Lymphs Abs: 0.4 10*3/uL — ABNORMAL LOW (ref 0.7–4.0)
MCH: 26.5 pg (ref 26.0–34.0)
MCHC: 32.4 g/dL (ref 30.0–36.0)
MCV: 81.7 fL (ref 80.0–100.0)
Monocytes Absolute: 0.6 10*3/uL (ref 0.1–1.0)
Monocytes Relative: 3 %
Neutro Abs: 16.1 10*3/uL — ABNORMAL HIGH (ref 1.7–7.7)
Neutrophils Relative %: 94 %
Platelets: 425 10*3/uL — ABNORMAL HIGH (ref 150–400)
RBC: 2.57 MIL/uL — ABNORMAL LOW (ref 3.87–5.11)
RDW: 18.2 % — ABNORMAL HIGH (ref 11.5–15.5)
WBC: 17.3 10*3/uL — ABNORMAL HIGH (ref 4.0–10.5)
nRBC: 0 % (ref 0.0–0.2)

## 2018-10-13 LAB — LACTIC ACID, PLASMA: Lactic Acid, Venous: 1.3 mmol/L (ref 0.5–1.9)

## 2018-10-13 LAB — COMPREHENSIVE METABOLIC PANEL
ALT: 14 U/L (ref 0–44)
AST: 31 U/L (ref 15–41)
Albumin: 2.7 g/dL — ABNORMAL LOW (ref 3.5–5.0)
Alkaline Phosphatase: 89 U/L (ref 38–126)
Anion gap: 13 (ref 5–15)
BUN: 36 mg/dL — ABNORMAL HIGH (ref 8–23)
CO2: 24 mmol/L (ref 22–32)
Calcium: 7.9 mg/dL — ABNORMAL LOW (ref 8.9–10.3)
Chloride: 95 mmol/L — ABNORMAL LOW (ref 98–111)
Creatinine, Ser: 2.29 mg/dL — ABNORMAL HIGH (ref 0.44–1.00)
GFR calc Af Amer: 24 mL/min — ABNORMAL LOW (ref >60)
GFR calc non Af Amer: 21 mL/min — ABNORMAL LOW (ref >60)
Glucose, Bld: 153 mg/dL — ABNORMAL HIGH (ref 70–99)
Potassium: 3.2 mmol/L — ABNORMAL LOW (ref 3.5–5.1)
Sodium: 132 mmol/L — ABNORMAL LOW (ref 135–145)
Total Bilirubin: 0.8 mg/dL (ref 0.3–1.2)
Total Protein: 6.8 g/dL (ref 6.5–8.1)

## 2018-10-13 LAB — SARS CORONAVIRUS 2 BY RT PCR (HOSPITAL ORDER, PERFORMED IN ~~LOC~~ HOSPITAL LAB): SARS Coronavirus 2: NEGATIVE

## 2018-10-13 LAB — PROCALCITONIN: Procalcitonin: 6.9 ng/mL

## 2018-10-13 MED ORDER — ACETAMINOPHEN 500 MG PO TABS
1000.0000 mg | ORAL_TABLET | Freq: Once | ORAL | Status: AC
  Administered 2018-10-13: 1000 mg via ORAL
  Filled 2018-10-13: qty 2

## 2018-10-13 MED ORDER — VANCOMYCIN HCL IN DEXTROSE 1-5 GM/200ML-% IV SOLN: 1000.0000 mg | Freq: Once | INTRAVENOUS | Status: DC

## 2018-10-13 MED ORDER — SODIUM CHLORIDE 0.9 % IV SOLN
2.0000 g | Freq: Once | INTRAVENOUS | Status: AC
  Administered 2018-10-13: 2 g via INTRAVENOUS
  Filled 2018-10-13 (×2): qty 2

## 2018-10-13 MED ORDER — VANCOMYCIN HCL 10 G IV SOLR
2000.0000 mg | Freq: Once | INTRAVENOUS | Status: AC
  Administered 2018-10-13: 2000 mg via INTRAVENOUS
  Filled 2018-10-13: qty 2000

## 2018-10-13 MED ORDER — SODIUM CHLORIDE 0.9 % IV BOLUS
1000.0000 mL | Freq: Once | INTRAVENOUS | Status: AC
  Administered 2018-10-13: 1000 mL via INTRAVENOUS

## 2018-10-13 NOTE — ED Notes (Addendum)
Report given to Hassan Rowan, life flight charge nurse

## 2018-10-13 NOTE — ED Provider Notes (Signed)
[facility] Emergency Department Provider Note    None    (approximate)  I have reviewed the triage vital signs and the nursing notes.   HISTORY  Chief Complaint Post-op Problem    HPI MARCEIL WELP is a 68 y.o. female presents the ER for evaluation of fever aches and weakness.  Patient status post cystectomy performed for bladder cancer at [facility] on the 20th.  Had diverting ureteral ileal conduit.  Not currently on chemotherapy.  Denies any cough or shortness of breath.  Denies any congestion or headache.    Past Medical History:  Diagnosis Date  . Carotid artery plaque, right 01/2014  . CKD (chronic kidney disease)    stage 2-3  . Diabetes mellitus without complication ([place])   . DM (diabetes mellitus), type 2, uncontrolled ([place])   . Hyperlipidemia   . Hypertension   . Hypochromic microcytic anemia    mild  . Osteoporosis   . Renal insufficiency   . Rotator cuff tendonitis, right    Family History  Problem Relation Age of Onset  . Heart disease Mother   . Heart attack Mother   . Arthritis Father   . Diabetes Brother    Past Surgical History:  Procedure Laterality Date  . ABDOMINAL HYSTERECTOMY  2000   due to bleeding and fibroids, partial- still has ovaries  . CYSTOSCOPY W/ RETROGRADES Bilateral 07/16/2018   Procedure: CYSTOSCOPY WITH RETROGRADE PYELOGRAM;  Surgeon: Billey Co, MD;  Location: [facility] ORS;  Service: Urology;  Laterality: Bilateral;  . KNEE SURGERY Left 03/17/2013   torn meniscus  . PORTA CATH INSERTION N/A 08/06/2018   Procedure: PORTA CATH INSERTION;  Surgeon: Algernon Huxley, MD;  Location: [place] [facility];  Service: Cardiovascular;  Laterality: N/A;  . TRANSURETHRAL RESECTION OF BLADDER TUMOR N/A 07/16/2018   Procedure: TRANSURETHRAL RESECTION OF BLADDER TUMOR (TURBT);  Surgeon: Billey Co, MD;  Location: [facility] ORS;  Service: Urology;  Laterality: N/A;   Patient Active Problem List   Diagnosis Date  Noted  . Iron deficiency anemia due to chronic blood loss 08/26/2018  . AKI (acute kidney injury) ([place]) 08/06/2018  . Bladder carcinoma ([place]) 08/03/2018  . Goals of care, counseling/discussion 08/03/2018  . Bladder tumor 07/16/2018  . Hyperlipidemia   . Hypertension associated with diabetes ([place])   . DM (diabetes mellitus), type 2, uncontrolled ([place])   . Renal insufficiency   . CKD (chronic kidney disease)   . Hypochromic microcytic anemia   . Chronic anemia 07/24/2016  . Uncontrolled type 2 diabetes mellitus without complication, without long-term current use of insulin ([place]) 03/11/2016  . Carotid artery plaque, right 01/02/2014  . Cervical facet joint syndrome 03/20/2013  . Spondylolisthesis of lumbar region 03/20/2013      Prior to Admission medications   Medication Sig Start Date End Date Taking? Authorizing Provider  amLODipine (NORVASC) 5 MG tablet Take 1 tablet (5 mg total) by mouth daily. 12/06/17   Kathrine Haddock, NP  Iron-Vitamin C 65-125 MG TABS Take 2 tablets by mouth daily. 08/06/18   [name], MD  lisinopril-hydrochlorothiazide (ZESTORETIC) 20-12.5 MG tablet Take 1 tablet by mouth 2 (two) times daily. 09/19/18   Volney American, PA-C  metFORMIN (GLUCOPHAGE) 1000 MG tablet Take 1 tablet (1,000 mg total) by mouth 2 (two) times daily. 04/11/18   Volney American, PA-C  pioglitazone (ACTOS) 15 MG tablet Take 15 mg by mouth daily.    [provider]  pravastatin (PRAVACHOL) 10  MG tablet Take 1 tablet (10 mg total) by mouth at bedtime. 04/11/18   Volney American, PA-C  prochlorperazine (COMPAZINE) 10 MG tablet Take 1 tablet (10 mg total) by mouth every 6 (six) hours as needed (Nausea or vomiting). 08/03/18 08/26/18  [name], MD    Allergies Patient has no known allergies.    Social History Social History   Tobacco Use  . Smoking status: Former Smoker    Last attempt to quit: 06/05/1991    Years since quitting: 27.3  . Smokeless tobacco: Never Used   Substance Use Topics  . Alcohol use: No  . Drug use: No    Review of Systems Patient denies headaches, rhinorrhea, blurry vision, numbness, shortness of breath, chest pain, edema, cough, abdominal pain, nausea, vomiting, diarrhea, dysuria, fevers, rashes or hallucinations unless otherwise stated above in HPI. ____________________________________________   PHYSICAL EXAM:  VITAL SIGNS: Vitals:   10/13/18 1800 10/13/18 1830  BP: (!) 132/55 (!) 123/52  Pulse: (!) 124 (!) 112  Resp:  19  Temp:    SpO2: 97% 97%    Constitutional: Alert and oriented ill appearing.  Eyes: Conjunctivae are normal.  Head: Atraumatic. Nose: No congestion/rhinnorhea. Mouth/Throat: Mucous membranes are moist.   Neck: No stridor. Painless ROM.  Cardiovascular: tachycardic rate, regular rhythm. Grossly normal heart sounds.  Good peripheral circulation. Respiratory: Normal respiratory effort.  No retractions. Lungs CTAB. Gastrointestinal: Soft with rlq ileal conduit with cloudy urine.  No severe tenderness to palpation or surrounding erythema.  No distention. No abdominal bruits. No CVA tenderness. Genitourinary: deferred Musculoskeletal: No lower extremity tenderness nor edema.  No joint effusions. Neurologic:  Normal speech and language. No gross focal neurologic deficits are appreciated. No facial droop Skin:  Skin is warm, dry and intact. No rash noted. Psychiatric: Mood and affect are normal. Speech and behavior are normal.  ____________________________________________   LABS (all labs ordered are listed, but only abnormal results are displayed)  Results for orders placed or performed during the hospital encounter of 10/13/18 (from the past 24 hour(s))  Lactic acid, plasma     Status: None   Collection Time: 10/13/18  5:54 PM  Result Value Ref Range   Lactic Acid, Venous 1.3 0.5 - 1.9 mmol/L  Comprehensive metabolic panel     Status: Abnormal   Collection Time: 10/13/18  5:54 PM  Result Value  Ref Range   Sodium 132 (L) 135 - 145 mmol/L   Potassium 3.2 (L) 3.5 - 5.1 mmol/L   Chloride 95 (L) 98 - 111 mmol/L   CO2 24 22 - 32 mmol/L   Glucose, Bld 153 (H) 70 - 99 mg/dL   BUN 36 (H) 8 - 23 mg/dL   Creatinine, Ser 2.29 (H) 0.44 - 1.00 mg/dL   Calcium 7.9 (L) 8.9 - 10.3 mg/dL   Total Protein 6.8 6.5 - 8.1 g/dL   Albumin 2.7 (L) 3.5 - 5.0 g/dL   AST 31 15 - 41 U/L   ALT 14 0 - 44 U/L   Alkaline Phosphatase 89 38 - 126 U/L   Total Bilirubin 0.8 0.3 - 1.2 mg/dL   GFR calc non Af Amer 21 (L) >60 mL/min   GFR calc Af Amer 24 (L) >60 mL/min   Anion gap 13 5 - 15  CBC WITH DIFFERENTIAL     Status: Abnormal   Collection Time: 10/13/18  5:54 PM  Result Value Ref Range   WBC 17.3 (H) 4.0 - 10.5 K/uL   RBC 2.57 (L)  3.87 - 5.11 MIL/uL   Hemoglobin 6.8 (L) 12.0 - 15.0 g/dL   HCT 21.0 (L) 36.0 - 46.0 %   MCV 81.7 80.0 - 100.0 fL   MCH 26.5 26.0 - 34.0 pg   MCHC 32.4 30.0 - 36.0 g/dL   RDW 18.2 (H) 11.5 - 15.5 %   Platelets 425 (H) 150 - 400 K/uL   nRBC 0.0 0.0 - 0.2 %   Neutrophils Relative % 94 %   Neutro Abs 16.1 (H) 1.7 - 7.7 K/uL   Lymphocytes Relative 2 %   Lymphs Abs 0.4 (L) 0.7 - 4.0 K/uL   Monocytes Relative 3 %   Monocytes Absolute 0.6 0.1 - 1.0 K/uL   Eosinophils Relative 0 %   Eosinophils Absolute 0.0 0.0 - 0.5 K/uL   Basophils Relative 0 %   Basophils Absolute 0.0 0.0 - 0.1 K/uL   Immature Granulocytes 1 %   Abs Immature Granulocytes 0.10 (H) 0.00 - 0.07 K/uL  Procalcitonin     Status: None   Collection Time: 10/13/18  5:54 PM  Result Value Ref Range   Procalcitonin 6.90 ng/mL  Urinalysis, Complete w Microscopic     Status: Abnormal   Collection Time: 10/13/18  5:54 PM  Result Value Ref Range   Color, Urine AMBER (A) YELLOW   APPearance TURBID (A) CLEAR   Specific Gravity, Urine 1.011 1.005 - 1.030   pH 8.0 5.0 - 8.0   Glucose, UA NEGATIVE NEGATIVE mg/dL   Hgb urine dipstick SMALL (A) NEGATIVE   Bilirubin Urine NEGATIVE NEGATIVE   Ketones, ur NEGATIVE  NEGATIVE mg/dL   Protein, ur 100 (A) NEGATIVE mg/dL   Nitrite NEGATIVE NEGATIVE   Leukocytes,Ua MODERATE (A) NEGATIVE   RBC / HPF >50 (H) 0 - 5 RBC/hpf   WBC, UA >50 (H) 0 - 5 WBC/hpf   Bacteria, UA NONE SEEN NONE SEEN   Squamous Epithelial / LPF NONE SEEN 0 - 5   WBC Clumps PRESENT    Mucus PRESENT    Amorphous Crystal PRESENT   SARS Coronavirus 2 (CEPHEID- Performed in [place] hospital lab), Hosp Order     Status: None   Collection Time: 10/13/18  5:55 PM  Result Value Ref Range   SARS Coronavirus 2 NEGATIVE NEGATIVE   ____________________________________________  EKG My review and personal interpretation at Time: 18:28   Indication: sepsis  Rate: 120  Rhythm: sinus Axis: normal Other: poor r wave, normal intervals ____________________________________________  RADIOLOGY  I personally reviewed all radiographic images ordered to evaluate for the above acute complaints and reviewed radiology reports and findings.  These findings were personally discussed with the patient.  Please see medical record for radiology report.  ____________________________________________   PROCEDURES  Procedure(s) performed:  .Critical Care Performed by: Merlyn Lot, MD Authorized by: Merlyn Lot, MD   Critical care provider statement:    Critical care time (minutes):  40   Critical care time was exclusive of:  Separately billable procedures and treating other patients   Critical care was necessary to treat or prevent imminent or life-threatening deterioration of the following conditions:  Sepsis   Critical care was time spent personally by me on the following activities:  Development of treatment plan with patient or surrogate, discussions with consultants, evaluation of patient's response to treatment, examination of patient, obtaining history from patient or surrogate, ordering and performing treatments and interventions, ordering and review of laboratory studies, ordering and  review of radiographic studies, pulse oximetry, re-evaluation of patient's condition  and review of old [place] performed: yes ____________________________________________   INITIAL IMPRESSION / ASSESSMENT AND PLAN / ED COURSE  Pertinent labs & imaging results that were available during my care of the patient were reviewed by me and considered in my medical decision making (see chart for details).   DDX: uti, pyelo, abscess, pna, covid, bacteremia  GUYNELL KLEIBER is a 68 y.o. who presents to the ED with symptoms as described above.  Patient ill-appearing but protecting her airway without any hypoxia or respiratory distress.  Certainly concerning for UTI or urosepsis possible pneumonia.  Will check for COVID.  Will order blood work give IV fluid bolus and reassess.  The patient will be placed on continuous pulse oximetry and telemetry for monitoring.  Laboratory evaluation will be sent to evaluate for the above complaints.     Clinical Course as of Oct 13 2039  Mon Oct 13, 2018  2001 CT shows evidence of mild to moderate hydro-bilaterally.  Most clinically consistent with bilateral pyelonephritis.  Unable to order CT renogram with contrast due to AKI.  We will continue with volume resuscitation.  COVID negative.  Also possible pneumonia though she is not having any profound hypoxia or shortness of breath.  Will discuss with [facility] as she gets all of her oncologic care and recent surgery there for transfer for further medical management.   [PR]  2033 discussed case with Dr. Marcelline Mates of urology at [facility] who currently agrees to accept patient for further medical management.   [PR]    Clinical Course User Index [PR] Merlyn Lot, MD    The patient was evaluated in Emergency Department today for the symptoms described in the history of present illness. He/she was evaluated in the context of the global COVID-19 pandemic, which necessitated consideration that the patient might  be at risk for infection with the SARS-CoV-2 virus that causes COVID-19. Institutional protocols and algorithms that pertain to the evaluation of patients at risk for COVID-19 are in a state of rapid change based on information released by regulatory bodies including the CDC and federal and state organizations. These policies and algorithms were followed during the patient's care in the ED.  As part of my medical decision making, I reviewed the following data within the [place] notes reviewed and incorporated, Labs reviewed, notes from prior ED visits and [place] Controlled Substance Database   ____________________________________________   FINAL CLINICAL IMPRESSION(S) / ED DIAGNOSES  Final diagnoses:  Sepsis with acute renal failure without septic shock, due to unspecified organism, unspecified acute renal failure type ([place])  Pyelonephritis      NEW MEDICATIONS STARTED DURING THIS VISIT:  New Prescriptions   No medications on file     Note:  This document was prepared using Dragon voice recognition software and may include unintentional dictation errors.    Merlyn Lot, MD 10/13/18 2041

## 2018-10-13 NOTE — ED Notes (Signed)
Pt voices good understanding of plan of care & transfer to [facility]; consent signed

## 2018-10-13 NOTE — ED Triage Notes (Signed)
Pt presents to ED via [facility] from home c/o possible sepsis. Pt had cystectomy 09/22/18 for bladder cancer. P130s, BP 98/62, Temp 103.3 with EMS. Pt reports cloudy, foul-smelling urine from urostomy bag.

## 2018-10-14 DIAGNOSIS — Z7401 Bed confinement status: Secondary | ICD-10-CM | POA: Diagnosis not present

## 2018-10-14 DIAGNOSIS — E782 Mixed hyperlipidemia: Secondary | ICD-10-CM | POA: Diagnosis present

## 2018-10-14 DIAGNOSIS — N898 Other specified noninflammatory disorders of vagina: Secondary | ICD-10-CM | POA: Diagnosis present

## 2018-10-14 DIAGNOSIS — Z906 Acquired absence of other parts of urinary tract: Secondary | ICD-10-CM | POA: Diagnosis not present

## 2018-10-14 DIAGNOSIS — N179 Acute kidney failure, unspecified: Secondary | ICD-10-CM | POA: Diagnosis present

## 2018-10-14 DIAGNOSIS — R6510 Systemic inflammatory response syndrome (SIRS) of non-infectious origin without acute organ dysfunction: Secondary | ICD-10-CM | POA: Diagnosis present

## 2018-10-14 DIAGNOSIS — C679 Malignant neoplasm of bladder, unspecified: Secondary | ICD-10-CM | POA: Diagnosis present

## 2018-10-14 DIAGNOSIS — Z8551 Personal history of malignant neoplasm of bladder: Secondary | ICD-10-CM | POA: Diagnosis not present

## 2018-10-14 DIAGNOSIS — Z6835 Body mass index (BMI) 35.0-35.9, adult: Secondary | ICD-10-CM | POA: Diagnosis not present

## 2018-10-14 DIAGNOSIS — R188 Other ascites: Secondary | ICD-10-CM | POA: Diagnosis not present

## 2018-10-14 DIAGNOSIS — I2510 Atherosclerotic heart disease of native coronary artery without angina pectoris: Secondary | ICD-10-CM | POA: Diagnosis present

## 2018-10-14 DIAGNOSIS — E669 Obesity, unspecified: Secondary | ICD-10-CM | POA: Diagnosis present

## 2018-10-14 DIAGNOSIS — N136 Pyonephrosis: Secondary | ICD-10-CM | POA: Diagnosis present

## 2018-10-14 DIAGNOSIS — I898 Other specified noninfective disorders of lymphatic vessels and lymph nodes: Secondary | ICD-10-CM | POA: Diagnosis present

## 2018-10-14 DIAGNOSIS — N131 Hydronephrosis with ureteral stricture, not elsewhere classified: Secondary | ICD-10-CM | POA: Diagnosis not present

## 2018-10-14 DIAGNOSIS — E1165 Type 2 diabetes mellitus with hyperglycemia: Secondary | ICD-10-CM | POA: Diagnosis present

## 2018-10-14 DIAGNOSIS — D649 Anemia, unspecified: Secondary | ICD-10-CM | POA: Diagnosis not present

## 2018-10-14 DIAGNOSIS — Z7984 Long term (current) use of oral hypoglycemic drugs: Secondary | ICD-10-CM | POA: Diagnosis not present

## 2018-10-14 DIAGNOSIS — I10 Essential (primary) hypertension: Secondary | ICD-10-CM | POA: Diagnosis present

## 2018-10-14 DIAGNOSIS — N12 Tubulo-interstitial nephritis, not specified as acute or chronic: Secondary | ICD-10-CM | POA: Insufficient documentation

## 2018-10-14 DIAGNOSIS — Z936 Other artificial openings of urinary tract status: Secondary | ICD-10-CM | POA: Diagnosis not present

## 2018-10-14 DIAGNOSIS — R1110 Vomiting, unspecified: Secondary | ICD-10-CM | POA: Diagnosis not present

## 2018-10-14 DIAGNOSIS — N99842 Postprocedural seroma of a genitourinary system organ or structure following a genitourinary system procedure: Secondary | ICD-10-CM | POA: Diagnosis present

## 2018-10-14 LAB — URINE CULTURE

## 2018-10-15 DIAGNOSIS — R188 Other ascites: Secondary | ICD-10-CM | POA: Diagnosis not present

## 2018-10-15 DIAGNOSIS — R000 Tachycardia, unspecified: Secondary | ICD-10-CM | POA: Insufficient documentation

## 2018-10-17 DIAGNOSIS — T888XXA Other specified complications of surgical and medical care, not elsewhere classified, initial encounter: Secondary | ICD-10-CM | POA: Insufficient documentation

## 2018-10-17 DIAGNOSIS — T8189XA Other complications of procedures, not elsewhere classified, initial encounter: Secondary | ICD-10-CM | POA: Insufficient documentation

## 2018-10-18 DIAGNOSIS — D630 Anemia in neoplastic disease: Secondary | ICD-10-CM | POA: Diagnosis not present

## 2018-10-18 DIAGNOSIS — N189 Chronic kidney disease, unspecified: Secondary | ICD-10-CM | POA: Diagnosis not present

## 2018-10-18 DIAGNOSIS — E1122 Type 2 diabetes mellitus with diabetic chronic kidney disease: Secondary | ICD-10-CM | POA: Diagnosis not present

## 2018-10-18 DIAGNOSIS — I129 Hypertensive chronic kidney disease with stage 1 through stage 4 chronic kidney disease, or unspecified chronic kidney disease: Secondary | ICD-10-CM | POA: Diagnosis not present

## 2018-10-18 DIAGNOSIS — I6529 Occlusion and stenosis of unspecified carotid artery: Secondary | ICD-10-CM | POA: Diagnosis not present

## 2018-10-18 DIAGNOSIS — C678 Malignant neoplasm of overlapping sites of bladder: Secondary | ICD-10-CM | POA: Diagnosis not present

## 2018-10-18 LAB — CULTURE, BLOOD (ROUTINE X 2)
Culture: NO GROWTH
Culture: NO GROWTH

## 2018-10-20 DIAGNOSIS — E1122 Type 2 diabetes mellitus with diabetic chronic kidney disease: Secondary | ICD-10-CM | POA: Diagnosis not present

## 2018-10-20 DIAGNOSIS — N189 Chronic kidney disease, unspecified: Secondary | ICD-10-CM | POA: Diagnosis not present

## 2018-10-20 DIAGNOSIS — C678 Malignant neoplasm of overlapping sites of bladder: Secondary | ICD-10-CM | POA: Diagnosis not present

## 2018-10-20 DIAGNOSIS — D630 Anemia in neoplastic disease: Secondary | ICD-10-CM | POA: Diagnosis not present

## 2018-10-20 DIAGNOSIS — I6529 Occlusion and stenosis of unspecified carotid artery: Secondary | ICD-10-CM | POA: Diagnosis not present

## 2018-10-20 DIAGNOSIS — I129 Hypertensive chronic kidney disease with stage 1 through stage 4 chronic kidney disease, or unspecified chronic kidney disease: Secondary | ICD-10-CM | POA: Diagnosis not present

## 2018-10-22 DIAGNOSIS — I129 Hypertensive chronic kidney disease with stage 1 through stage 4 chronic kidney disease, or unspecified chronic kidney disease: Secondary | ICD-10-CM | POA: Diagnosis not present

## 2018-10-22 DIAGNOSIS — N189 Chronic kidney disease, unspecified: Secondary | ICD-10-CM | POA: Diagnosis not present

## 2018-10-22 DIAGNOSIS — E1122 Type 2 diabetes mellitus with diabetic chronic kidney disease: Secondary | ICD-10-CM | POA: Diagnosis not present

## 2018-10-22 DIAGNOSIS — I6529 Occlusion and stenosis of unspecified carotid artery: Secondary | ICD-10-CM | POA: Diagnosis not present

## 2018-10-22 DIAGNOSIS — D630 Anemia in neoplastic disease: Secondary | ICD-10-CM | POA: Diagnosis not present

## 2018-10-22 DIAGNOSIS — C678 Malignant neoplasm of overlapping sites of bladder: Secondary | ICD-10-CM | POA: Diagnosis not present

## 2018-10-23 DIAGNOSIS — C674 Malignant neoplasm of posterior wall of bladder: Secondary | ICD-10-CM | POA: Diagnosis not present

## 2018-10-24 DIAGNOSIS — I6529 Occlusion and stenosis of unspecified carotid artery: Secondary | ICD-10-CM | POA: Diagnosis not present

## 2018-10-24 DIAGNOSIS — I129 Hypertensive chronic kidney disease with stage 1 through stage 4 chronic kidney disease, or unspecified chronic kidney disease: Secondary | ICD-10-CM | POA: Diagnosis not present

## 2018-10-24 DIAGNOSIS — E1122 Type 2 diabetes mellitus with diabetic chronic kidney disease: Secondary | ICD-10-CM | POA: Diagnosis not present

## 2018-10-24 DIAGNOSIS — D630 Anemia in neoplastic disease: Secondary | ICD-10-CM | POA: Diagnosis not present

## 2018-10-24 DIAGNOSIS — N189 Chronic kidney disease, unspecified: Secondary | ICD-10-CM | POA: Diagnosis not present

## 2018-10-24 DIAGNOSIS — C678 Malignant neoplasm of overlapping sites of bladder: Secondary | ICD-10-CM | POA: Diagnosis not present

## 2018-10-26 ENCOUNTER — Other Ambulatory Visit: Payer: Self-pay | Admitting: Family Medicine

## 2018-10-28 DIAGNOSIS — E1122 Type 2 diabetes mellitus with diabetic chronic kidney disease: Secondary | ICD-10-CM | POA: Diagnosis not present

## 2018-10-28 DIAGNOSIS — D630 Anemia in neoplastic disease: Secondary | ICD-10-CM | POA: Diagnosis not present

## 2018-10-28 DIAGNOSIS — I129 Hypertensive chronic kidney disease with stage 1 through stage 4 chronic kidney disease, or unspecified chronic kidney disease: Secondary | ICD-10-CM | POA: Diagnosis not present

## 2018-10-28 DIAGNOSIS — N189 Chronic kidney disease, unspecified: Secondary | ICD-10-CM | POA: Diagnosis not present

## 2018-10-28 DIAGNOSIS — I6529 Occlusion and stenosis of unspecified carotid artery: Secondary | ICD-10-CM | POA: Diagnosis not present

## 2018-10-28 DIAGNOSIS — C678 Malignant neoplasm of overlapping sites of bladder: Secondary | ICD-10-CM | POA: Diagnosis not present

## 2018-10-31 DIAGNOSIS — I129 Hypertensive chronic kidney disease with stage 1 through stage 4 chronic kidney disease, or unspecified chronic kidney disease: Secondary | ICD-10-CM | POA: Diagnosis not present

## 2018-10-31 DIAGNOSIS — Z7984 Long term (current) use of oral hypoglycemic drugs: Secondary | ICD-10-CM | POA: Diagnosis not present

## 2018-10-31 DIAGNOSIS — C678 Malignant neoplasm of overlapping sites of bladder: Secondary | ICD-10-CM | POA: Diagnosis not present

## 2018-10-31 DIAGNOSIS — N189 Chronic kidney disease, unspecified: Secondary | ICD-10-CM | POA: Diagnosis not present

## 2018-10-31 DIAGNOSIS — Z9181 History of falling: Secondary | ICD-10-CM | POA: Diagnosis not present

## 2018-10-31 DIAGNOSIS — I6529 Occlusion and stenosis of unspecified carotid artery: Secondary | ICD-10-CM | POA: Diagnosis not present

## 2018-10-31 DIAGNOSIS — D630 Anemia in neoplastic disease: Secondary | ICD-10-CM | POA: Diagnosis not present

## 2018-10-31 DIAGNOSIS — E1122 Type 2 diabetes mellitus with diabetic chronic kidney disease: Secondary | ICD-10-CM | POA: Diagnosis not present

## 2018-11-13 DIAGNOSIS — I129 Hypertensive chronic kidney disease with stage 1 through stage 4 chronic kidney disease, or unspecified chronic kidney disease: Secondary | ICD-10-CM | POA: Diagnosis not present

## 2018-11-13 DIAGNOSIS — I6529 Occlusion and stenosis of unspecified carotid artery: Secondary | ICD-10-CM | POA: Diagnosis not present

## 2018-11-13 DIAGNOSIS — E1122 Type 2 diabetes mellitus with diabetic chronic kidney disease: Secondary | ICD-10-CM | POA: Diagnosis not present

## 2018-11-13 DIAGNOSIS — C678 Malignant neoplasm of overlapping sites of bladder: Secondary | ICD-10-CM | POA: Diagnosis not present

## 2018-11-13 DIAGNOSIS — D630 Anemia in neoplastic disease: Secondary | ICD-10-CM | POA: Diagnosis not present

## 2018-11-13 DIAGNOSIS — N189 Chronic kidney disease, unspecified: Secondary | ICD-10-CM | POA: Diagnosis not present

## 2018-11-18 ENCOUNTER — Telehealth: Payer: Self-pay | Admitting: Family Medicine

## 2018-11-18 DIAGNOSIS — E1122 Type 2 diabetes mellitus with diabetic chronic kidney disease: Secondary | ICD-10-CM | POA: Diagnosis not present

## 2018-11-18 DIAGNOSIS — I129 Hypertensive chronic kidney disease with stage 1 through stage 4 chronic kidney disease, or unspecified chronic kidney disease: Secondary | ICD-10-CM | POA: Diagnosis not present

## 2018-11-18 DIAGNOSIS — D630 Anemia in neoplastic disease: Secondary | ICD-10-CM | POA: Diagnosis not present

## 2018-11-18 DIAGNOSIS — N189 Chronic kidney disease, unspecified: Secondary | ICD-10-CM | POA: Diagnosis not present

## 2018-11-18 DIAGNOSIS — I6529 Occlusion and stenosis of unspecified carotid artery: Secondary | ICD-10-CM | POA: Diagnosis not present

## 2018-11-18 DIAGNOSIS — C678 Malignant neoplasm of overlapping sites of bladder: Secondary | ICD-10-CM | POA: Diagnosis not present

## 2018-11-18 NOTE — Telephone Encounter (Signed)
Angela Russell calling from [facility] called and stated that patient is being discharged from services because she meet all goals. Please advise

## 2018-11-19 NOTE — Telephone Encounter (Signed)
She is currently scheduled for July for f/u, ok to see sooner if needed. Please see if she needs anything from [place] upon discharge

## 2018-11-20 NOTE — Telephone Encounter (Signed)
Spoke w/ patient. She states she is doing well and will see [place] at [facility] [place] appointment.

## 2018-11-20 NOTE — Telephone Encounter (Signed)
Tried to call number provider, not a working number, will try again later.

## 2018-12-10 ENCOUNTER — Telehealth: Payer: Self-pay | Admitting: *Deleted

## 2018-12-10 NOTE — Telephone Encounter (Signed)
Called pt to go over questions for COVID19 pre screening before her 12/11/18 MD visit:  Per pt request to [facility]. 12/11/18 MD appt. She stated that she would call back to R/S at a later date.

## 2018-12-11 ENCOUNTER — Inpatient Hospital Stay: Payer: Medicare Other | Admitting: Oncology

## 2018-12-12 ENCOUNTER — Other Ambulatory Visit: Payer: Self-pay | Admitting: Unknown Physician Specialty

## 2018-12-18 ENCOUNTER — Other Ambulatory Visit: Payer: Self-pay

## 2018-12-18 ENCOUNTER — Ambulatory Visit (INDEPENDENT_AMBULATORY_CARE_PROVIDER_SITE_OTHER): Payer: Medicare Other | Admitting: Family Medicine

## 2018-12-18 ENCOUNTER — Encounter: Payer: Self-pay | Admitting: Family Medicine

## 2018-12-18 VITALS — BP 122/72 | HR 74 | Temp 98.2°F

## 2018-12-18 DIAGNOSIS — I152 Hypertension secondary to endocrine disorders: Secondary | ICD-10-CM

## 2018-12-18 DIAGNOSIS — E1159 Type 2 diabetes mellitus with other circulatory complications: Secondary | ICD-10-CM

## 2018-12-18 DIAGNOSIS — C679 Malignant neoplasm of bladder, unspecified: Secondary | ICD-10-CM

## 2018-12-18 DIAGNOSIS — G8929 Other chronic pain: Secondary | ICD-10-CM

## 2018-12-18 DIAGNOSIS — N183 Chronic kidney disease, stage 3 unspecified: Secondary | ICD-10-CM

## 2018-12-18 DIAGNOSIS — E782 Mixed hyperlipidemia: Secondary | ICD-10-CM

## 2018-12-18 DIAGNOSIS — E119 Type 2 diabetes mellitus without complications: Secondary | ICD-10-CM | POA: Diagnosis not present

## 2018-12-18 DIAGNOSIS — I10 Essential (primary) hypertension: Secondary | ICD-10-CM | POA: Diagnosis not present

## 2018-12-18 DIAGNOSIS — M25562 Pain in left knee: Secondary | ICD-10-CM

## 2018-12-18 DIAGNOSIS — E118 Type 2 diabetes mellitus with unspecified complications: Secondary | ICD-10-CM | POA: Diagnosis not present

## 2018-12-18 MED ORDER — DICLOFENAC SODIUM 1 % TD GEL: 2.0000 g | Freq: Four times a day (QID) | TRANSDERMAL | 2 refills | Status: DC

## 2018-12-18 MED ORDER — AMLODIPINE BESYLATE 5 MG PO TABS: 5.0000 mg | ORAL_TABLET | Freq: Every day | ORAL | 1 refills | Status: DC

## 2018-12-18 MED ORDER — PIOGLITAZONE HCL 15 MG PO TABS: 15.0000 mg | ORAL_TABLET | Freq: Every day | ORAL | 1 refills | Status: DC

## 2018-12-18 MED ORDER — PRAVASTATIN SODIUM 10 MG PO TABS: 10.0000 mg | ORAL_TABLET | Freq: Every day | ORAL | 1 refills | Status: DC

## 2018-12-18 NOTE — Progress Notes (Signed)
BP 122/72   Pulse 74   Temp 98.2 F (36.8 C) (Oral)   SpO2 100%    Subjective:    Patient ID: Angela Russell, female    DOB: Jul 31, 1950, 68 y.o.   MRN: [id number]  HPI: Angela Russell is a 68 y.o. female  Chief Complaint  Patient presents with  . Follow-up  . Hypertension  . Diabetes   Here today for 6 month f/u.   Main concern today is chronic left knee pain and swelling. Left knee brace helping, diclofenac used to help. Known hx of arthritis. No recent injuries, redness, warmth in the joint.   HTN - home BPs 120s/70s typically. Taking medications faithfully without side effects. Denies CP, SOB, dizziness, HAs.   DM - Currently on metformin and actos. No low blood sugar spells. Does not check home BSs. Tries to eat well and stay as active as possible.   HLD - on pravastatin, tolerating well without myalgias. Denies SOB, claudication, CP.   Bladder cancer - Dxed 07/2018, followed closely by Urology and [facility], is s/p cystectomy with urostomy bag in place.   Relevant past medical, surgical, family and social history reviewed and updated as indicated. Interim medical history since our last visit reviewed. Allergies and medications reviewed and updated.  Review of Systems  Per HPI unless specifically indicated above     Objective:    BP 122/72   Pulse 74   Temp 98.2 F (36.8 C) (Oral)   SpO2 100%   Wt Readings from Last 3 Encounters:  12/19/18 177 lb 11.1 oz (80.6 kg)  10/13/18 220 lb (99.8 kg)  09/18/18 204 lb (92.5 kg)    Physical Exam Vitals signs and nursing note reviewed.  Constitutional:      Appearance: Normal appearance. She is not ill-appearing.  HENT:     Head: Atraumatic.  Eyes:     Extraocular Movements: Extraocular movements intact.     Conjunctiva/sclera: Conjunctivae normal.  Neck:     Musculoskeletal: Normal range of motion and neck supple.  Cardiovascular:     Rate and Rhythm: Normal rate and regular rhythm.     Heart sounds:  Normal heart sounds.  Pulmonary:     Effort: Pulmonary effort is normal.     Breath sounds: Normal breath sounds.  Musculoskeletal: Normal range of motion.        General: Swelling (b/l anterior knee effusion, mild to moderate. No erythema, warmth) and tenderness (b/l knees, L > R) present.  Skin:    General: Skin is warm and dry.  Neurological:     Mental Status: She is alert and oriented to person, place, and time.  Psychiatric:        Mood and Affect: Mood normal.        Thought Content: Thought content normal.        Judgment: Judgment normal.     Results for orders placed or performed in visit on 12/18/18  Lipid Panel w/o Chol/HDL Ratio  Result Value Ref Range   Cholesterol, Total 139 100 - 199 mg/dL   Triglycerides 103 0 - 149 mg/dL   HDL 49 >39 mg/dL   VLDL Cholesterol Cal 21 5 - 40 mg/dL   LDL Calculated 69 0 - 99 mg/dL  Comprehensive metabolic panel  Result Value Ref Range   Glucose 89 65 - 99 mg/dL   BUN 38 (H) 8 - 27 mg/dL   Creatinine, Ser 2.04 (H) 0.57 - 1.00 mg/dL   GFR  calc non Af Amer 24 (L) >59 mL/min/1.73   GFR calc Af Amer 28 (L) >59 mL/min/1.73   BUN/Creatinine Ratio 19 12 - 28   Sodium 139 134 - 144 mmol/L   Potassium 9.1 (HH) 3.5 - 5.2 mmol/L   Chloride 113 (H) 96 - 106 mmol/L   CO2 14 (L) 20 - 29 mmol/L   Calcium 10.2 8.7 - 10.3 mg/dL   Total Protein 7.5 6.0 - 8.5 g/dL   Albumin 4.5 3.8 - 4.8 g/dL   Globulin, Total 3.0 1.5 - 4.5 g/dL   Albumin/Globulin Ratio 1.5 1.2 - 2.2   Bilirubin Total 0.2 0.0 - 1.2 mg/dL   Alkaline Phosphatase 96 39 - 117 IU/L   AST 14 0 - 40 IU/L   ALT 8 0 - 32 IU/L  HgB A1c  Result Value Ref Range   Hgb A1c MFr Bld 5.9 (H) 4.8 - 5.6 %   Est. average glucose Bld gHb Est-mCnc 123 mg/dL      Assessment & Plan:   Problem List Items Addressed This Visit      Cardiovascular and Mediastinum   Hypertension associated with diabetes ([place]) - Primary    BPs stable and WNL, continue current regimen      Relevant  Medications   pravastatin (PRAVACHOL) 10 MG tablet   amLODipine (NORVASC) 5 MG tablet   Other Relevant Orders   Comprehensive metabolic panel (Completed)     Endocrine   Controlled diabetes mellitus type 2 with complications (HCC)    Recheck A1C, adjust as needed. Continue current regimen      Relevant Medications   pravastatin (PRAVACHOL) 10 MG tablet     Genitourinary   CKD (chronic kidney disease)    Recheck labs, continue good hydration. Adjust as needed      Bladder carcinoma ([place])    Followed by [place] and Urology, continue per their recommendations. Is s/p cystectomy with urostomy in place        Other   Hyperlipidemia    Recheck lipids, adjust as needed. Continue current regimen      Relevant Medications   pravastatin (PRAVACHOL) 10 MG tablet   amLODipine (NORVASC) 5 MG tablet   Other Relevant Orders   Lipid Panel w/o Chol/HDL Ratio (Completed)    Other Visit Diagnoses    Type 2 diabetes mellitus without complication, without long-term current use of insulin (HCC)       Relevant Medications   pravastatin (PRAVACHOL) 10 MG tablet   Other Relevant Orders   HgB A1c (Completed)   Chronic pain of left knee       Continue with compression brace prn, diclofenac gel sent for prn use. Ice, elevate. Suspect arthritic       Follow up plan: Return in about 6 months (around 06/20/2019) for 6 month f/u, AWV with [facility].

## 2018-12-19 ENCOUNTER — Inpatient Hospital Stay: Payer: Medicare Other

## 2018-12-19 ENCOUNTER — Inpatient Hospital Stay
Admission: EM | Admit: 2018-12-19 | Discharge: 2018-12-21 | DRG: 641 | Disposition: A | Discharge status: Home / Self Care | Payer: Medicare Other | Attending: Internal Medicine | Admitting: Internal Medicine

## 2018-12-19 ENCOUNTER — Encounter: Payer: Self-pay | Admitting: Emergency Medicine

## 2018-12-19 ENCOUNTER — Telehealth: Payer: Self-pay

## 2018-12-19 ENCOUNTER — Other Ambulatory Visit: Payer: Self-pay

## 2018-12-19 DIAGNOSIS — E785 Hyperlipidemia, unspecified: Secondary | ICD-10-CM | POA: Diagnosis present

## 2018-12-19 DIAGNOSIS — D509 Iron deficiency anemia, unspecified: Secondary | ICD-10-CM | POA: Diagnosis present

## 2018-12-19 DIAGNOSIS — I10 Essential (primary) hypertension: Secondary | ICD-10-CM | POA: Diagnosis not present

## 2018-12-19 DIAGNOSIS — N183 Chronic kidney disease, stage 3 (moderate): Secondary | ICD-10-CM | POA: Diagnosis not present

## 2018-12-19 DIAGNOSIS — E875 Hyperkalemia: Secondary | ICD-10-CM | POA: Diagnosis not present

## 2018-12-19 DIAGNOSIS — N179 Acute kidney failure, unspecified: Secondary | ICD-10-CM | POA: Diagnosis not present

## 2018-12-19 DIAGNOSIS — E872 Acidosis: Secondary | ICD-10-CM | POA: Diagnosis not present

## 2018-12-19 DIAGNOSIS — Z79899 Other long term (current) drug therapy: Secondary | ICD-10-CM | POA: Diagnosis not present

## 2018-12-19 DIAGNOSIS — Z906 Acquired absence of other parts of urinary tract: Secondary | ICD-10-CM

## 2018-12-19 DIAGNOSIS — M810 Age-related osteoporosis without current pathological fracture: Secondary | ICD-10-CM | POA: Diagnosis present

## 2018-12-19 DIAGNOSIS — Z791 Long term (current) use of non-steroidal anti-inflammatories (NSAID): Secondary | ICD-10-CM

## 2018-12-19 DIAGNOSIS — E1122 Type 2 diabetes mellitus with diabetic chronic kidney disease: Secondary | ICD-10-CM | POA: Diagnosis not present

## 2018-12-19 DIAGNOSIS — Z1159 Encounter for screening for other viral diseases: Secondary | ICD-10-CM | POA: Diagnosis not present

## 2018-12-19 DIAGNOSIS — I129 Hypertensive chronic kidney disease with stage 1 through stage 4 chronic kidney disease, or unspecified chronic kidney disease: Secondary | ICD-10-CM | POA: Diagnosis present

## 2018-12-19 DIAGNOSIS — Z833 Family history of diabetes mellitus: Secondary | ICD-10-CM | POA: Diagnosis not present

## 2018-12-19 DIAGNOSIS — Z8551 Personal history of malignant neoplasm of bladder: Secondary | ICD-10-CM

## 2018-12-19 DIAGNOSIS — Z7984 Long term (current) use of oral hypoglycemic drugs: Secondary | ICD-10-CM | POA: Diagnosis not present

## 2018-12-19 DIAGNOSIS — Z20828 Contact with and (suspected) exposure to other viral communicable diseases: Secondary | ICD-10-CM | POA: Diagnosis not present

## 2018-12-19 DIAGNOSIS — Z87891 Personal history of nicotine dependence: Secondary | ICD-10-CM | POA: Diagnosis not present

## 2018-12-19 DIAGNOSIS — K5900 Constipation, unspecified: Secondary | ICD-10-CM | POA: Diagnosis present

## 2018-12-19 LAB — CBC WITH DIFFERENTIAL/PLATELET
Abs Immature Granulocytes: 0.02 10*3/uL (ref 0.00–0.07)
Basophils Absolute: 0 10*3/uL (ref 0.0–0.1)
Basophils Relative: 1 %
Eosinophils Absolute: 0.1 10*3/uL (ref 0.0–0.5)
Eosinophils Relative: 1 %
HCT: 27.1 % — ABNORMAL LOW (ref 36.0–46.0)
Hemoglobin: 8.3 g/dL — ABNORMAL LOW (ref 12.0–15.0)
Immature Granulocytes: 0 %
Lymphocytes Relative: 19 %
Lymphs Abs: 1.1 10*3/uL (ref 0.7–4.0)
MCH: 25 pg — ABNORMAL LOW (ref 26.0–34.0)
MCHC: 30.6 g/dL (ref 30.0–36.0)
MCV: 81.6 fL (ref 80.0–100.0)
Monocytes Absolute: 0.4 10*3/uL (ref 0.1–1.0)
Monocytes Relative: 7 %
Neutro Abs: 4.3 10*3/uL (ref 1.7–7.7)
Neutrophils Relative %: 72 %
Platelets: 346 10*3/uL (ref 150–400)
RBC: 3.32 MIL/uL — ABNORMAL LOW (ref 3.87–5.11)
RDW: 17.4 % — ABNORMAL HIGH (ref 11.5–15.5)
WBC: 6 10*3/uL (ref 4.0–10.5)
nRBC: 0 % (ref 0.0–0.2)

## 2018-12-19 LAB — BASIC METABOLIC PANEL
Anion gap: 8 (ref 5–15)
Anion gap: 7 (ref 5–15)
BUN: 40 mg/dL — ABNORMAL HIGH (ref 8–23)
BUN: 37 mg/dL — ABNORMAL HIGH (ref 8–23)
CO2: 16 mmol/L — ABNORMAL LOW (ref 22–32)
CO2: 15 mmol/L — ABNORMAL LOW (ref 22–32)
Calcium: 9.7 mg/dL (ref 8.9–10.3)
Calcium: 9.7 mg/dL (ref 8.9–10.3)
Chloride: 112 mmol/L — ABNORMAL HIGH (ref 98–111)
Chloride: 115 mmol/L — ABNORMAL HIGH (ref 98–111)
Creatinine, Ser: 2.33 mg/dL — ABNORMAL HIGH (ref 0.44–1.00)
Creatinine, Ser: 2.03 mg/dL — ABNORMAL HIGH (ref 0.44–1.00)
GFR calc Af Amer: 24 mL/min — ABNORMAL LOW (ref >60)
GFR calc Af Amer: 28 mL/min — ABNORMAL LOW (ref >60)
GFR calc non Af Amer: 21 mL/min — ABNORMAL LOW (ref >60)
GFR calc non Af Amer: 24 mL/min — ABNORMAL LOW (ref >60)
Glucose, Bld: 92 mg/dL (ref 70–99)
Glucose, Bld: 129 mg/dL — ABNORMAL HIGH (ref 70–99)
Potassium: >7.5 mmol/L (ref 3.5–5.1)
Potassium: 6.3 mmol/L (ref 3.5–5.1)
Sodium: 136 mmol/L (ref 135–145)
Sodium: 137 mmol/L (ref 135–145)

## 2018-12-19 LAB — COMPREHENSIVE METABOLIC PANEL
ALT: 8 IU/L (ref 0–32)
AST: 14 IU/L (ref 0–40)
Albumin/Globulin Ratio: 1.5 (ref 1.2–2.2)
Albumin: 4.5 g/dL (ref 3.8–4.8)
Alkaline Phosphatase: 96 IU/L (ref 39–117)
BUN/Creatinine Ratio: 19 (ref 12–28)
BUN: 38 mg/dL — ABNORMAL HIGH (ref 8–27)
Bilirubin Total: 0.2 mg/dL (ref 0.0–1.2)
CO2: 14 mmol/L — ABNORMAL LOW (ref 20–29)
Calcium: 10.2 mg/dL (ref 8.7–10.3)
Chloride: 113 mmol/L — ABNORMAL HIGH (ref 96–106)
Creatinine, Ser: 2.04 mg/dL — ABNORMAL HIGH (ref 0.57–1.00)
GFR calc Af Amer: 28 mL/min/{1.73_m2} — ABNORMAL LOW (ref >59)
GFR calc non Af Amer: 24 mL/min/{1.73_m2} — ABNORMAL LOW (ref >59)
Globulin, Total: 3 g/dL (ref 1.5–4.5)
Glucose: 89 mg/dL (ref 65–99)
Potassium: 9.1 mmol/L (ref 3.5–5.2)
Sodium: 139 mmol/L (ref 134–144)
Total Protein: 7.5 g/dL (ref 6.0–8.5)

## 2018-12-19 LAB — LIPID PANEL W/O CHOL/HDL RATIO
Cholesterol, Total: 139 mg/dL (ref 100–199)
HDL: 49 mg/dL (ref >39)
LDL Calculated: 69 mg/dL (ref 0–99)
Triglycerides: 103 mg/dL (ref 0–149)
VLDL Cholesterol Cal: 21 mg/dL (ref 5–40)

## 2018-12-19 LAB — BILIRUBIN, FRACTIONATED(TOT/DIR/INDIR)
Bilirubin, Direct: <0.1 mg/dL (ref 0.0–0.2)
Total Bilirubin: 0.5 mg/dL (ref 0.3–1.2)

## 2018-12-19 LAB — HEMOGLOBIN A1C
Est. average glucose Bld gHb Est-mCnc: 123 mg/dL
Hgb A1c MFr Bld: 5.9 % — ABNORMAL HIGH (ref 4.8–5.6)

## 2018-12-19 LAB — LACTATE DEHYDROGENASE: LDH: 112 U/L (ref 98–192)

## 2018-12-19 LAB — MRSA PCR SCREENING: MRSA by PCR: NEGATIVE

## 2018-12-19 LAB — GLUCOSE, CAPILLARY: Glucose-Capillary: 134 mg/dL — ABNORMAL HIGH (ref 70–99)

## 2018-12-19 LAB — SARS CORONAVIRUS 2 BY RT PCR (HOSPITAL ORDER, PERFORMED IN ~~LOC~~ HOSPITAL LAB): SARS Coronavirus 2: NEGATIVE

## 2018-12-19 MED ORDER — CALCIUM GLUCONATE 10 % IV SOLN
1.0000 g | Freq: Once | INTRAVENOUS | Status: AC
  Administered 2018-12-19: 1 g via INTRAVENOUS
  Filled 2018-12-19: qty 10

## 2018-12-19 MED ORDER — INSULIN ASPART 100 UNIT/ML ~~LOC~~ SOLN
5.0000 [IU] | Freq: Once | SUBCUTANEOUS | Status: AC
  Administered 2018-12-19: 5 [IU] via INTRAVENOUS
  Filled 2018-12-19: qty 1

## 2018-12-19 MED ORDER — PRAVASTATIN SODIUM 20 MG PO TABS
10.0000 mg | ORAL_TABLET | Freq: Every day | ORAL | Status: DC
  Administered 2018-12-19 – 2018-12-20 (×2): 10 mg via ORAL
  Filled 2018-12-19 (×2): qty 1

## 2018-12-19 MED ORDER — ONDANSETRON HCL 4 MG PO TABS: 4.0000 mg | ORAL_TABLET | Freq: Four times a day (QID) | ORAL | Status: DC | PRN

## 2018-12-19 MED ORDER — SODIUM BICARBONATE 8.4 % IV SOLN
50.0000 meq | Freq: Once | INTRAVENOUS | Status: AC
  Administered 2018-12-19: 50 meq via INTRAVENOUS
  Filled 2018-12-19: qty 50

## 2018-12-19 MED ORDER — SODIUM BICARBONATE 8.4 % IV SOLN
INTRAVENOUS | Status: DC
  Administered 2018-12-19 – 2018-12-21 (×5): via INTRAVENOUS
  Filled 2018-12-19 (×9): qty 150

## 2018-12-19 MED ORDER — ONDANSETRON HCL 4 MG/2ML IJ SOLN: 4.0000 mg | Freq: Four times a day (QID) | INTRAMUSCULAR | Status: DC | PRN

## 2018-12-19 MED ORDER — POLYETHYLENE GLYCOL 3350 17 G PO PACK
17.0000 g | PACK | Freq: Every day | ORAL | Status: DC | PRN
  Administered 2018-12-20 – 2018-12-21 (×2): 17 g via ORAL
  Filled 2018-12-19 (×2): qty 1

## 2018-12-19 MED ORDER — INSULIN ASPART 100 UNIT/ML ~~LOC~~ SOLN: 0.0000 [IU] | Freq: Three times a day (TID) | SUBCUTANEOUS | Status: DC

## 2018-12-19 MED ORDER — ACETAMINOPHEN 650 MG RE SUPP: 650.0000 mg | Freq: Four times a day (QID) | RECTAL | Status: DC | PRN

## 2018-12-19 MED ORDER — INSULIN ASPART 100 UNIT/ML ~~LOC~~ SOLN: 0.0000 [IU] | Freq: Every day | SUBCUTANEOUS | Status: DC

## 2018-12-19 MED ORDER — INSULIN REGULAR HUMAN 100 UNIT/ML IJ SOLN
10.0000 [IU] | Freq: Once | INTRAMUSCULAR | Status: AC
  Administered 2018-12-19: 10 [IU] via INTRAVENOUS
  Filled 2018-12-19: qty 10

## 2018-12-19 MED ORDER — DEXTROSE 50 % IV SOLN
50.0000 mL | INTRAVENOUS | Status: AC
  Administered 2018-12-19 (×2): 50 mL via INTRAVENOUS
  Filled 2018-12-19 (×2): qty 50

## 2018-12-19 MED ORDER — HEPARIN SODIUM (PORCINE) 5000 UNIT/ML IJ SOLN
5000.0000 [IU] | Freq: Three times a day (TID) | INTRAMUSCULAR | Status: DC
  Administered 2018-12-19 – 2018-12-21 (×5): 5000 [IU] via SUBCUTANEOUS
  Filled 2018-12-19 (×5): qty 1

## 2018-12-19 MED ORDER — ACETAMINOPHEN 325 MG PO TABS: 650.0000 mg | ORAL_TABLET | Freq: Four times a day (QID) | ORAL | Status: DC | PRN

## 2018-12-19 MED ORDER — STERILE WATER FOR INJECTION IV SOLN
INTRAVENOUS | Status: DC
  Administered 2018-12-19: 20:00:00 via INTRAVENOUS
  Filled 2018-12-19 (×3): qty 850

## 2018-12-19 MED ORDER — SODIUM CHLORIDE 0.9 % IV BOLUS
500.0000 mL | Freq: Once | INTRAVENOUS | Status: AC
  Administered 2018-12-19: 500 mL via INTRAVENOUS

## 2018-12-19 MED ORDER — PATIROMER SORBITEX CALCIUM 8.4 G PO PACK
25.2000 g | PACK | Freq: Every day | ORAL | Status: DC
  Administered 2018-12-19 – 2018-12-21 (×3): 25.2 g via ORAL
  Filled 2018-12-19 (×4): qty 3

## 2018-12-19 MED ORDER — DEXTROSE 50 % IV SOLN
1.0000 | Freq: Once | INTRAVENOUS | Status: AC
  Administered 2018-12-19: 50 mL via INTRAVENOUS
  Filled 2018-12-19: qty 50

## 2018-12-19 NOTE — H&P (Addendum)
[facility] NAME: Angela Russell    MR#:  [id number]  DATE OF BIRTH:  11-02-1950  DATE OF ADMISSION:  12/19/2018  PRIMARY CARE PHYSICIAN: Volney American, PA-C   REQUESTING/REFERRING PHYSICIAN: Merlyn Lot, MD  CHIEF COMPLAINT:   Chief Complaint  Patient presents with  . Abnormal Lab    HISTORY OF PRESENT ILLNESS:  Angela Russell  is a 68 y.o. female with a known history of bladder cancer s/p cystectomy with urostomy bag, hypertension, type 2 diabetes, CKD 3, hyperlipidemia who was sent to the ED by her PCP due to outpatient labs showing a potassium of 9.1.  Patient states she has not been feeling well for the last week.  She endorses overall muscle weakness and fatigue.  She also endorses some generalized abdominal pain.  She denies any palpitations, chest pain, shortness of breath.  In the ED, she was mildly tachycardic.  Labs were significant for K > 7.5, creatinine 2.33, hemoglobin 8.3.  EKG with marked sinus arrhythmia.  She was given calcium gluconate, NovoLog, Veltassa, sodium bicarb.  Hospitalists were called for admission.  PAST MEDICAL HISTORY:   Past Medical History:  Diagnosis Date  . Carotid artery plaque, right 01/2014  . CKD (chronic kidney disease)    stage 2-3  . Diabetes mellitus without complication ([place])   . DM (diabetes mellitus), type 2, uncontrolled ([place])   . Hyperlipidemia   . Hypertension   . Hypochromic microcytic anemia    mild  . Osteoporosis   . Renal insufficiency   . Rotator cuff tendonitis, right     PAST SURGICAL HISTORY:   Past Surgical History:  Procedure Laterality Date  . ABDOMINAL HYSTERECTOMY  2000   due to bleeding and fibroids, partial- still has ovaries  . CYSTOSCOPY W/ RETROGRADES Bilateral 07/16/2018   Procedure: CYSTOSCOPY WITH RETROGRADE PYELOGRAM;  Surgeon: Billey Co, MD;  Location: [facility] ORS;  Service: Urology;  Laterality: Bilateral;  . KNEE SURGERY  Left 03/17/2013   torn meniscus  . PORTA CATH INSERTION N/A 08/06/2018   Procedure: PORTA CATH INSERTION;  Surgeon: Algernon Huxley, MD;  Location: [facility];  Service: Cardiovascular;  Laterality: N/A;  . TRANSURETHRAL RESECTION OF BLADDER TUMOR N/A 07/16/2018   Procedure: TRANSURETHRAL RESECTION OF BLADDER TUMOR (TURBT);  Surgeon: Billey Co, MD;  Location: [facility];  Service: Urology;  Laterality: N/A;    SOCIAL HISTORY:   Social History   Tobacco Use  . Smoking status: Former Smoker    Quit date: 06/05/1991    Years since quitting: 27.5  . Smokeless tobacco: Never Used  Substance Use Topics  . Alcohol use: No    FAMILY HISTORY:   Family History  Problem Relation Age of Onset  . Heart disease Mother   . Heart attack Mother   . Arthritis Father   . Diabetes Brother     DRUG ALLERGIES:  No Known Allergies  REVIEW OF SYSTEMS:   Review of Systems  Constitutional: Positive for malaise/fatigue. Negative for chills and fever.  HENT: Negative for congestion and sore throat.   Eyes: Negative for blurred vision and double vision.  Respiratory: Negative for cough and shortness of breath.   Cardiovascular: Negative for chest pain and palpitations.  Gastrointestinal: Positive for constipation. Negative for nausea and vomiting.  Genitourinary: Negative for dysuria and urgency.  Musculoskeletal: Negative for back pain and neck pain.  Neurological: Positive for weakness. Negative for dizziness, focal weakness  and headaches.  Psychiatric/Behavioral: Negative for depression. The patient is not nervous/anxious.     MEDICATIONS AT HOME:   Prior to Admission medications   Medication Sig Start Date End Date Taking? Authorizing Provider  amLODipine (NORVASC) 5 MG tablet Take 1 tablet (5 mg total) by mouth daily. 12/18/18   Volney American, PA-C  diclofenac sodium (VOLTAREN) 1 % GEL Apply 2 g topically 4 (four) times daily. 12/18/18   Volney American, PA-C   Iron-Vitamin C 65-125 MG TABS Take 2 tablets by mouth daily. 08/06/18   [name], MD  lisinopril-hydrochlorothiazide (ZESTORETIC) 20-12.5 MG tablet Take 1 tablet by mouth 2 (two) times daily. 09/19/18   Volney American, PA-C  metFORMIN (GLUCOPHAGE) 1000 MG tablet TAKE 1 TABLET(1000 MG) BY MOUTH TWICE DAILY 10/26/18   Volney American, PA-C  pioglitazone (ACTOS) 15 MG tablet Take 1 tablet (15 mg total) by mouth daily. 12/18/18   Volney American, PA-C  pravastatin (PRAVACHOL) 10 MG tablet Take 1 tablet (10 mg total) by mouth at bedtime. 12/18/18   Volney American, PA-C  prochlorperazine (COMPAZINE) 10 MG tablet Take 1 tablet (10 mg total) by mouth every 6 (six) hours as needed (Nausea or vomiting). 08/03/18 08/26/18  [name], MD      VITAL SIGNS:  Blood pressure (!) 130/53, pulse 89, temperature 98.3 F (36.8 C), temperature source Oral, resp. rate 20, height 5\' 4"  (1.626 m), weight 83 kg, SpO2 100 %.  PHYSICAL EXAMINATION:  Physical Exam  GENERAL:  68 y.o.-year-old patient lying in the bed with no acute distress. Tired-appearing, but non-toxic. EYES: Pupils equal, round, reactive to light and accommodation. No scleral icterus. Extraocular muscles intact.  HEENT: Head atraumatic, normocephalic. Oropharynx and nasopharynx clear.  NECK:  Supple, no jugular venous distention. No thyroid enlargement, no tenderness.  LUNGS: Normal breath sounds bilaterally, no wheezing, rales,rhonchi or crepitation. No use of accessory muscles of respiration.  CARDIOVASCULAR: Tachycardic, regular rhythm, S1, S2 normal. No murmurs, rubs, or gallops.  ABDOMEN: Soft, nontender, nondistended. Bowel sounds present. No organomegaly or mass. +urostomy bag in place with small amount of urine present. EXTREMITIES: No pedal edema, cyanosis, or clubbing.  NEUROLOGIC: Cranial nerves II through XII are intact. Muscle strength 5/5 in all extremities. Sensation intact. Gait not checked.  PSYCHIATRIC: The  patient is alert and oriented x 3.  SKIN: No obvious rash, lesion, or ulcer.   LABORATORY PANEL:   CBC Recent Labs  Lab 12/19/18 1446  WBC 6.0  HGB 8.3*  HCT 27.1*  PLT 346   ------------------------------------------------------------------------------------------------------------------  Chemistries  Recent Labs  Lab 12/18/18 1740 12/19/18 1446  NA 139 136  K 9.1* >7.5*  CL 113* 112*  CO2 14* 16*  GLUCOSE 89 92  BUN 38* 40*  CREATININE 2.04* 2.33*  CALCIUM 10.2 9.7  AST 14  --   ALT 8  --   ALKPHOS 96  --   BILITOT 0.2  --    ------------------------------------------------------------------------------------------------------------------  Cardiac Enzymes No results for input(s): TROPONINI in the last 168 hours. ------------------------------------------------------------------------------------------------------------------  RADIOLOGY:  No results found.    IMPRESSION AND PLAN:   Hyperkalemia- possibly due to obstruction (due to hx of bladder cancer), hemolysis, ACE. -s/p calcium gluconate, insulin, Veltassa, sodium bicarb given in the ED -Nephrology consult -Recheck BMP at 1800 -Renal ultrasound ordered -LDH, haptoglobin, bilirubin ordered -Holding home lisinopril -Cardiac monitoring  AKI in CKD III- baseline creatinine seems to be ~1.1-1.5 -Continue IVFs -Avoid nephrotoxic agents -Holding home lisinopril -Renal ultrasound  as above  History of bladder cancer s/p cystectomy with urostomy bag in place. Follows with [facility] care -Urology consulted  Chronic iron deficiency anemia- hemoglobin is close to baseline. -Hemolysis labs as above -Recent anemia panel unremarkable -Monitor  Hypertension- BPs soft in the ED -Hold home BP meds  Type 2 diabetes -Sensitive SSI  Hyperlipidemia- stable -Continue home statin  All the records are reviewed and case discussed with ED provider. Management plans discussed with the  patient, family and they are in agreement.  CODE STATUS: Full  TOTAL TIME TAKING CARE OF THIS PATIENT: 45 minutes.    Berna Spare Madison Direnzo M.D on 12/19/2018 at 3:31 PM  Between 7am to 6pm - Pager - [phone]  After 6pm go to [email or web address] - password [email or web address] [facility]  [employer] [facility]  Office  [phone]  CC: Primary care physician; Volney American, PA-C   Note: This dictation was prepared with Dragon dictation along with smaller phrase technology. Any transcriptional errors that result from this process are unintentional.

## 2018-12-19 NOTE — Telephone Encounter (Signed)
Patient notified of Rachel's instructions and lab. Patient verbalized understanding.

## 2018-12-19 NOTE — Consult Note (Signed)
Urology Consult  Chief Complaint: Weakness  History of Present Illness: Angela Russell is a 68 y.o. year old female admitted for acute kidney injury with severe hyperkalemia seen in consultation at request of Dr. Brett Albino for evaluation of possible post renal etiology.  She has a history of muscle invasive urothelial carcinoma the bladder with sarcomatoid changes.  She is status post radical cystectomy at [facility] in April 2020.  Brief clinical summary as follows: 07/03/18 CT AP: posterior bladder wall mass, no metastatic or upper tract disease 07/16/18 TURBT +b/l RPG w/ Dr. Diamantina Providence: T2, HG + CIS, with focal squamous/sarcomatoid changes 08/07/18 CT chest: NED 08/04/18 ddMVAC x 1 cycles, stopped due to intolerance, AKI 09/22/2018 Cystectomy/IC - 3cm [id number] (11LN neg) 10/14/18 Admitted for pyelonephritis and pelvic fluid collection, drain placed.  10/23/18 Drain removed.   She had a CT of the abdomen and pelvis at [facility] on 10/13/2018 which showed mild bilateral hydronephrosis and hydroureter.  She had a left nephroureteral stent in place at the time.  She was seen by her PCP yesterday with a several week history of generalized weakness.  She was sent to the ED when her potassium level was 9.1.  It was 7.5 on repeat.  Her creatinine in the ED was 2.33.  And a repeat creatinine this evening was 2.03.  It was 1.58 on 08/28/2018.  Renal ultrasound was performed after admission which shows no hydronephrosis.  She denies flank pain.  She does complain of vaginal and pelvic pain worse when coughing which has been present for over 2 months.  Past Medical History:  Diagnosis Date  . Carotid artery plaque, right 01/2014  . CKD (chronic kidney disease)    stage 2-3  . Diabetes mellitus without complication ([place])   . DM (diabetes mellitus), type 2, uncontrolled ([place])   . Hyperlipidemia   . Hypertension   . Hypochromic microcytic anemia    mild  . Osteoporosis   . Renal insufficiency   . Rotator cuff  tendonitis, right     Past Surgical History:  Procedure Laterality Date  . ABDOMINAL HYSTERECTOMY  2000   due to bleeding and fibroids, partial- still has ovaries  . CYSTOSCOPY W/ RETROGRADES Bilateral 07/16/2018   Procedure: CYSTOSCOPY WITH RETROGRADE PYELOGRAM;  Surgeon: Billey Co, MD;  Location: [facility] OR[facility];  Service: Urology;  Laterality: Bilateral;  . KNEE SURGERY Left 03/17/2013   torn meniscus  . PORTA CATH INSERTION N/A 08/06/2018   Procedure: PORTA CATH INSERTION;  Surgeon: Algernon Huxley, MD;  Location: [place] [facility];  Service: Cardiovascular;  Laterality: N/A;  . TRANSURETHRAL RESECTION OF BLADDER TUMOR N/A 07/16/2018   Procedure: TRANSURETHRAL RESECTION OF BLADDER TUMOR (TURBT);  Surgeon: Billey Co, MD;  Location: [facility];  Service: Urology;  Laterality: N/A;    Home Medications:  Current Meds  Medication Sig  . amLODipine (NORVASC) 5 MG tablet Take 1 tablet (5 mg total) by mouth daily.  . diclofenac sodium (VOLTAREN) 1 % GEL Apply 2 g topically 4 (four) times daily.  Marland Kitchen lisinopril-hydrochlorothiazide (ZESTORETIC) 20-12.5 MG tablet Take 1 tablet by mouth 2 (two) times daily.  . metFORMIN (GLUCOPHAGE) 1000 MG tablet TAKE 1 TABLET(1000 MG) BY MOUTH TWICE DAILY  . pioglitazone (ACTOS) 15 MG tablet Take 1 tablet (15 mg total) by mouth daily.  . pravastatin (PRAVACHOL) 10 MG tablet Take 1 tablet (10 mg total) by mouth at bedtime.    Allergies: No Known Allergies  Family History  Problem Relation  Age of Onset  . Heart disease Mother   . Heart attack Mother   . Arthritis Father   . Diabetes Brother     Social History:  reports that she quit smoking about 27 years ago. She has never used smokeless tobacco. She reports that she does not drink alcohol or use drugs.  ROS: A complete review of systems was performed.  All systems are negative except for pertinent findings as noted.  Physical Exam:  Vital signs in last 24 hours: Temp:  [98.3 F (36.8 C)] 98.3  F (36.8 C) (07/17 1743) Pulse Rate:  [89-103] 103 (07/17 1743) Resp:  [17-20] 17 (07/17 1743) BP: (130-156)/(53-62) 156/62 (07/17 1743) SpO2:  [100 %] 100 % (07/17 1743) Weight:  [80.6 kg-83 kg] 80.6 kg (07/17 1743) Constitutional:  Alert and oriented, No acute distress HEENT: [place] AT, moist mucus membranes.  Trachea midline, no masses Respiratory: Normal respiratory effort GI: Abdomen is soft, nontender, nondistended, no abdominal masses.   GU: Stoma pink and healthy with clear urine in urostomy bag.    Laboratory Data:  Recent Labs    12/19/18 1446  WBC 6.0  HGB 8.3*  HCT 27.1*   Recent Labs    12/18/18 1740 12/19/18 1446 12/19/18 1841  NA 139 136 137  K 9.1* >7.5* 6.3*  CL 113* 112* 115*  CO2 14* 16* 15*  GLUCOSE 89 92 129*  BUN 38* 40* 37*  CREATININE 2.04* 2.33* 2.03*  CALCIUM 10.2 9.7 9.7    Radiologic Imaging: US Renal  Result Date: 12/19/2018 CLINICAL DATA:  Hyperkalemia. EXAM: RENAL / URINARY TRACT ULTRASOUND COMPLETE COMPARISON:  CT abdomen pelvis 10/13/2018 FINDINGS: Right Kidney: Renal measurements: 9.7 x 3.9 x 4.7 cm = volume: 95 mL . Echogenicity within normal limits. No mass or hydronephrosis visualized. Left Kidney: Renal measurements: 10.0 x 4.5 x 5.6 cm = volume: 134 mL. Echogenicity within normal limits. No mass or hydronephrosis visualized. Bladder: Previous cystectomy. IMPRESSION: Kidneys normal size without hydronephrosis. Previous cystectomy. Electronically Signed   By: Marin Olp M.D.   On: 12/19/2018 17:30    Impression/Assessment:  68 year old female with history of muscle invasive urothelial carcinoma the bladder status post radical cystectomy with ileal conduit.  Renal ultrasound shows normal-appearing kidneys without hydronephrosis.  No evidence of a post renal cause of her elevated creatinine or hyperkalemia.  She does relate to a 19-month history of vaginal pelvic pain, worse with coughing.  She did have a fluid collection drained  percutaneously in the pelvic sidewall in May.  She was informed that radical cystectomy is an extensive surgery and that her current pain is most likely due to postoperative inflammation.  If her pain persist it was recommended she address with her urologist at [facility].  Recommendation:  Please reconsult if needed.  12/19/2018, 7:50 PM  John Giovanni,  MD

## 2018-12-19 NOTE — Telephone Encounter (Signed)
Crystal with [facility] called to report a critical lab result for patient. Potassium of 9., verified by a repeat draw.

## 2018-12-19 NOTE — ED Provider Notes (Signed)
[facility] Emergency Department Provider Note    First MD Initiated Contact with Patient 12/19/18 1427     (approximate)  I have reviewed the triage vital signs and the nursing notes.   HISTORY  Chief Complaint Abnormal Lab    HPI Angela Russell is a 68 y.o. female presents the ER for evaluation of abnormal blood work.  Reportedly had routine blood work done yesterday after she was seen her primary care physician for feeling generalized weakness and constipation for several weeks.  Says that she was called back due to abnormal potassium level.  She denies any chest pain.  No palpitations.  Denies any history of kidney disease.  She not on dialysis.  Does have a history of bladder cancer.l.  Says she is not on current chemotherapy.  Denies any new medications.    Past Medical History:  Diagnosis Date  . Carotid artery plaque, right 01/2014  . CKD (chronic kidney disease)    stage 2-3  . Diabetes mellitus without complication ([place])   . DM (diabetes mellitus), type 2, uncontrolled ([place])   . Hyperlipidemia   . Hypertension   . Hypochromic microcytic anemia    mild  . Osteoporosis   . Renal insufficiency   . Rotator cuff tendonitis, right    Family History  Problem Relation Age of Onset  . Heart disease Mother   . Heart attack Mother   . Arthritis Father   . Diabetes Brother    Past Surgical History:  Procedure Laterality Date  . ABDOMINAL HYSTERECTOMY  2000   due to bleeding and fibroids, partial- still has ovaries  . CYSTOSCOPY W/ RETROGRADES Bilateral 07/16/2018   Procedure: CYSTOSCOPY WITH RETROGRADE PYELOGRAM;  Surgeon: Billey Co, MD;  Location: [facility] ORS;  Service: Urology;  Laterality: Bilateral;  . KNEE SURGERY Left 03/17/2013   torn meniscus  . PORTA CATH INSERTION N/A 08/06/2018   Procedure: PORTA CATH INSERTION;  Surgeon: Algernon Huxley, MD;  Location: [place] [facility];  Service: Cardiovascular;  Laterality: N/A;  .  TRANSURETHRAL RESECTION OF BLADDER TUMOR N/A 07/16/2018   Procedure: TRANSURETHRAL RESECTION OF BLADDER TUMOR (TURBT);  Surgeon: Billey Co, MD;  Location: [facility] ORS;  Service: Urology;  Laterality: N/A;   Patient Active Problem List   Diagnosis Date Noted  . Fluid collection at surgical site 10/17/2018  . Tachycardia 10/15/2018  . Pyelonephritis 10/14/2018  . Iron deficiency anemia due to chronic blood loss 08/26/2018  . AKI (acute kidney injury) ([place]) 08/06/2018  . Bladder carcinoma ([place]) 08/03/2018  . Goals of care, counseling/discussion 08/03/2018  . Bladder tumor 07/16/2018  . Hyperlipidemia   . Hypertension associated with diabetes ([place])   . DM (diabetes mellitus), type 2, uncontrolled ([place])   . Renal insufficiency   . CKD (chronic kidney disease)   . Hypochromic microcytic anemia   . Chronic anemia 07/24/2016  . Uncontrolled type 2 diabetes mellitus without complication, without long-term current use of insulin ([place]) 03/11/2016  . Carotid artery plaque, right 01/02/2014  . Cervical facet joint syndrome 03/20/2013  . Spondylolisthesis of lumbar region 03/20/2013      Prior to Admission medications   Medication Sig Start Date End Date Taking? Authorizing Provider  amLODipine (NORVASC) 5 MG tablet Take 1 tablet (5 mg total) by mouth daily. 12/18/18   Volney American, PA-C  diclofenac sodium (VOLTAREN) 1 % GEL Apply 2 g topically 4 (four) times daily. 12/18/18   Volney American, PA-C  Iron-Vitamin  C 65-125 MG TABS Take 2 tablets by mouth daily. 08/06/18   [name], MD  lisinopril-hydrochlorothiazide (ZESTORETIC) 20-12.5 MG tablet Take 1 tablet by mouth 2 (two) times daily. 09/19/18   Volney American, PA-C  metFORMIN (GLUCOPHAGE) 1000 MG tablet TAKE 1 TABLET(1000 MG) BY MOUTH TWICE DAILY 10/26/18   Volney American, PA-C  pioglitazone (ACTOS) 15 MG tablet Take 1 tablet (15 mg total) by mouth daily. 12/18/18   Volney American, PA-C  pravastatin  (PRAVACHOL) 10 MG tablet Take 1 tablet (10 mg total) by mouth at bedtime. 12/18/18   Volney American, PA-C  prochlorperazine (COMPAZINE) 10 MG tablet Take 1 tablet (10 mg total) by mouth every 6 (six) hours as needed (Nausea or vomiting). 08/03/18 08/26/18  [name], MD    Allergies Patient has no known allergies.    Social History Social History   Tobacco Use  . Smoking status: Former Smoker    Quit date: 06/05/1991    Years since quitting: 27.5  . Smokeless tobacco: Never Used  Substance Use Topics  . Alcohol use: No  . Drug use: No    Review of Systems Patient denies headaches, rhinorrhea, blurry vision, numbness, shortness of breath, chest pain, edema, cough, abdominal pain, nausea, vomiting, diarrhea, dysuria, fevers, rashes or hallucinations unless otherwise stated above in HPI. ____________________________________________   PHYSICAL EXAM:  VITAL SIGNS: Vitals:   12/19/18 1356  BP: (!) 130/53  Pulse: 89  Resp: 20  Temp: 98.3 F (36.8 C)  SpO2: 100%    Constitutional: Alert and oriented.  Eyes: Conjunctivae are normal.  Head: Atraumatic. Nose: No congestion/rhinnorhea. Mouth/Throat: Mucous membranes are moist.   Neck: No stridor. Painless ROM.  Cardiovascular: Normal rate, regular rhythm. Grossly normal heart sounds.  Good peripheral circulation. Respiratory: Normal respiratory effort.  No retractions. Lungs CTAB. Gastrointestinal: Soft and nontender. No distention. No abdominal bruits. No CVA tenderness. Genitourinary:  Musculoskeletal: No lower extremity tenderness nor edema.  No joint effusions. Neurologic:  Normal speech and language. No gross focal neurologic deficits are appreciated. No facial droop Skin:  Skin is warm, dry and intact. No rash noted. Psychiatric: Mood and affect are normal. Speech and behavior are normal.  ____________________________________________   LABS (all labs ordered are listed, but only abnormal results are displayed)   Results for orders placed or performed during the hospital encounter of 12/19/18 (from the past 24 hour(s))  CBC with Differential     Status: Abnormal   Collection Time: 12/19/18  2:46 PM  Result Value Ref Range   WBC 6.0 4.0 - 10.5 K/uL   RBC 3.32 (L) 3.87 - 5.11 MIL/uL   Hemoglobin 8.3 (L) 12.0 - 15.0 g/dL   HCT 27.1 (L) 36.0 - 46.0 %   MCV 81.6 80.0 - 100.0 fL   MCH 25.0 (L) 26.0 - 34.0 pg   MCHC 30.6 30.0 - 36.0 g/dL   RDW 17.4 (H) 11.5 - 15.5 %   Platelets 346 150 - 400 K/uL   nRBC 0.0 0.0 - 0.2 %   Neutrophils Relative % 72 %   Neutro Abs 4.3 1.7 - 7.7 K/uL   Lymphocytes Relative 19 %   Lymphs Abs 1.1 0.7 - 4.0 K/uL   Monocytes Relative 7 %   Monocytes Absolute 0.4 0.1 - 1.0 K/uL   Eosinophils Relative 1 %   Eosinophils Absolute 0.1 0.0 - 0.5 K/uL   Basophils Relative 1 %   Basophils Absolute 0.0 0.0 - 0.1 K/uL   Immature Granulocytes  0 %   Abs Immature Granulocytes 0.02 0.00 - 0.07 K/uL  Basic metabolic panel     Status: Abnormal   Collection Time: 12/19/18  2:46 PM  Result Value Ref Range   Sodium 136 135 - 145 mmol/L   Potassium >7.5 (HH) 3.5 - 5.1 mmol/L   Chloride 112 (H) 98 - 111 mmol/L   CO2 16 (L) 22 - 32 mmol/L   Glucose, Bld 92 70 - 99 mg/dL   BUN 40 (H) 8 - 23 mg/dL   Creatinine, Ser 2.33 (H) 0.44 - 1.00 mg/dL   Calcium 9.7 8.9 - 10.3 mg/dL   GFR calc non Af Amer 21 (L) >60 mL/min   GFR calc Af Amer 24 (L) >60 mL/min   Anion gap 8 5 - 15   ____________________________________________  EKG My review and personal interpretation at Time: 14:02   Indication: hyperkalemia  Rate: 75  Rhythm: sinus dysrhythmia Axis: normal Other: peaked t waves ____________________________________________  RADIOLOGY  I personally reviewed all radiographic images ordered to evaluate for the above acute complaints and reviewed radiology reports and findings.  These findings were personally discussed with the patient.  Please see medical record for radiology report.   ____________________________________________   PROCEDURES  Procedure(s) performed:  .Critical Care Performed by: Merlyn Lot, MD Authorized by: Merlyn Lot, MD   Critical care provider statement:    Critical care time (minutes):  30   Critical care time was exclusive of:  Separately billable procedures and treating other patients   Critical care was necessary to treat or prevent imminent or life-threatening deterioration of the following conditions:  Renal failure   Critical care was time spent personally by me on the following activities:  Development of treatment plan with patient or surrogate, discussions with consultants, evaluation of patient's response to treatment, examination of patient, obtaining history from patient or surrogate, ordering and performing treatments and interventions, ordering and review of laboratory studies, ordering and review of radiographic studies, pulse oximetry, re-evaluation of patient's condition and review of old charts      Critical Care performed: yes ____________________________________________   INITIAL IMPRESSION / [place] / ED COURSE  Pertinent labs & imaging results that were available during my care of the patient were reviewed by me and considered in my medical decision making (see chart for details).   DDX: renal failure, electrolyte abn. Med effect,   WILMARY LEVIT is a 68 y.o. who presents to the ED with abnormal lab.  Is having some vague mild symptoms but has significantly elevated potassium from clinic yesterday.  Potassium will be repeated here.  We will go ahead and temporize with IV insulin as well as IV calcium while awaiting repeat.  She does have hyperacute T waves.  Her abdominal exam is soft and benign.  May be having some sort of obstructive uropathy may be medication effect.  Lower suspicion for acute hemolysis.  The patient will be placed on continuous pulse oximetry and telemetry for monitoring.   Laboratory evaluation will be sent to evaluate for the above complaints.     Clinical Course as of Dec 18 1536  Fri Dec 19, 2018  1444 CBC with Differential [PR]  1537 Repeat blood work does confirm hyper kalemia.  We will give IV fluids as well as continue with IV temporization with sodium bicarb albuterol [place].  I have consulted with Dr. Candiss Norse of nephrology.  Will order stat renal ultrasound.  Discussed with hospitalist for admission.  Have discussed  with the patient and available family all diagnostics and treatments performed thus far and all questions were answered to the best of my ability. The patient demonstrates understanding and agreement with plan.    [PR]    Clinical Course User Index [PR] Merlyn Lot, MD    The patient was evaluated in Emergency Department today for the symptoms described in the history of present illness. He/she was evaluated in the context of the global COVID-19 pandemic, which necessitated consideration that the patient might be at risk for infection with the SARS-CoV-2 virus that causes COVID-19. Institutional protocols and algorithms that pertain to the evaluation of patients at risk for COVID-19 are in a state of rapid change based on information released by regulatory bodies including the CDC and federal and state organizations. These policies and algorithms were followed during the patient's care in the ED.  As part of my medical decision making, I reviewed the following data within the [facility] notes reviewed and incorporated, Labs reviewed, notes from prior ED visits and [place] Controlled Substance Database   ____________________________________________   FINAL CLINICAL IMPRESSION(S) / ED DIAGNOSES  Final diagnoses:  Hyperkalemia      NEW MEDICATIONS STARTED DURING THIS VISIT:  New Prescriptions   No medications on file     Note:  This document was prepared using Dragon voice recognition software and may  include unintentional dictation errors.    Merlyn Lot, MD 12/19/18 1538

## 2018-12-19 NOTE — ED Notes (Signed)
ED TO INPATIENT HANDOFF REPORT  ED Nurse Name and Phone #:  Benay Pillow [phone]  S Name/Age/Gender Angela Russell 68 y.o. female Room/Bed: ED03A/ED03A  Code Status   Code Status: Prior  Home/SNF/Other Home Patient oriented to: self, place, time and situation Is this baseline? Yes   Triage Complete: Triage complete  Chief Complaint abnormal labs  Triage Note Patient states she was sent by PCP due to elevated potassium. States she has had minimal shortness of breath and has had some constipation for the last week. Patient also reports "I just don't feel good" however reports no specific symptoms.    Allergies No Known Allergies  Level of Care/Admitting Diagnosis ED Disposition    ED Disposition Condition [facility] Area: [place] [[id number]]  Level of Care: Stepdown [14]  Covid Evaluation: Asymptomatic Screening Protocol (No Symptoms)  Diagnosis: Hyperkalemia [962952]  Admitting Physician: Hyman Bible DODD [[id number]]  Attending Physician: Hyman Bible DODD [[id number]]  Estimated length of stay: past midnight tomorrow  Certification:: I certify this patient will need inpatient services for at least 2 midnights  PT Class (Do Not Modify): Inpatient [101]  PT Acc Code (Do Not Modify): Private [1]       B Medical/Surgery History Past Medical History:  Diagnosis Date  . Carotid artery plaque, right 01/2014  . CKD (chronic kidney disease)    stage 2-3  . Diabetes mellitus without complication ([place])   . DM (diabetes mellitus), type 2, uncontrolled ([place])   . Hyperlipidemia   . Hypertension   . Hypochromic microcytic anemia    mild  . Osteoporosis   . Renal insufficiency   . Rotator cuff tendonitis, right    Past Surgical History:  Procedure Laterality Date  . ABDOMINAL HYSTERECTOMY  2000   due to bleeding and fibroids, partial- still has ovaries  . CYSTOSCOPY W/ RETROGRADES Bilateral 07/16/2018   Procedure: CYSTOSCOPY WITH  RETROGRADE PYELOGRAM;  Surgeon: Billey Co, MD;  Location: [facility] ORS;  Service: Urology;  Laterality: Bilateral;  . KNEE SURGERY Left 03/17/2013   torn meniscus  . PORTA CATH INSERTION N/A 08/06/2018   Procedure: PORTA CATH INSERTION;  Surgeon: Algernon Huxley, MD;  Location: [facility];  Service: Cardiovascular;  Laterality: N/A;  . TRANSURETHRAL RESECTION OF BLADDER TUMOR N/A 07/16/2018   Procedure: TRANSURETHRAL RESECTION OF BLADDER TUMOR (TURBT);  Surgeon: Billey Co, MD;  Location: [facility] ORS;  Service: Urology;  Laterality: N/A;     A IV Location/Drains/Wounds Patient Lines/Drains/Airways Status   Active Line/Drains/Airways    Name:   Placement date:   Placement time:   Site:   Days:   Implanted Port 08/06/18 Right Chest   08/06/18    1429    Chest   135   Peripheral IV 10/13/18 Right Antecubital   10/13/18    1804    Antecubital   67          Intake/Output Last 24 hours No intake or output data in the 24 hours ending 12/19/18 1618  Labs/Imaging Results for orders placed or performed during the hospital encounter of 12/19/18 (from the past 48 hour(s))  CBC with Differential     Status: Abnormal   Collection Time: 12/19/18  2:46 PM  Result Value Ref Range   WBC 6.0 4.0 - 10.5 K/uL   RBC 3.32 (L) 3.87 - 5.11 MIL/uL   Hemoglobin 8.3 (L) 12.0 - 15.0 g/dL   HCT 27.1 (L) 36.0 - 46.0 %  MCV 81.6 80.0 - 100.0 fL   MCH 25.0 (L) 26.0 - 34.0 pg   MCHC 30.6 30.0 - 36.0 g/dL   RDW 17.4 (H) 11.5 - 15.5 %   Platelets 346 150 - 400 K/uL   nRBC 0.0 0.0 - 0.2 %   Neutrophils Relative % 72 %   Neutro Abs 4.3 1.7 - 7.7 K/uL   Lymphocytes Relative 19 %   Lymphs Abs 1.1 0.7 - 4.0 K/uL   Monocytes Relative 7 %   Monocytes Absolute 0.4 0.1 - 1.0 K/uL   Eosinophils Relative 1 %   Eosinophils Absolute 0.1 0.0 - 0.5 K/uL   Basophils Relative 1 %   Basophils Absolute 0.0 0.0 - 0.1 K/uL   Immature Granulocytes 0 %   Abs Immature Granulocytes 0.02 0.00 - 0.07 K/uL    Comment:  Performed at [facility], [address] [place], [place] [address]  Basic metabolic panel     Status: Abnormal   Collection Time: 12/19/18  2:46 PM  Result Value Ref Range   Sodium 136 135 - 145 mmol/L   Potassium >7.5 (HH) 3.5 - 5.1 mmol/L    Comment: CRITICAL RESULT CALLED TO, READ BACK BY AND VERIFIED WITH Shailynn Fong AT 1527 12/19/2018 DAS RESULT CONFIRMED BY MANUAL DILUTION    Chloride 112 (H) 98 - 111 mmol/L   CO2 16 (L) 22 - 32 mmol/L   Glucose, Bld 92 70 - 99 mg/dL   BUN 40 (H) 8 - 23 mg/dL   Creatinine, Ser 2.33 (H) 0.44 - 1.00 mg/dL   Calcium 9.7 8.9 - 10.3 mg/dL   GFR calc non Af Amer 21 (L) >60 mL/min   GFR calc Af Amer 24 (L) >60 mL/min   Anion gap 8 5 - 15    Comment: Performed at [facility], [address] [place], [place] [address]   No results found.  Pending Labs [employer] (From admission, onward)    Start     Ordered   12/19/18 1444  SARS Coronavirus 2 (CEPHEID- Performed in [place] hospital lab), Hosp Order  (Symptomatic Patients Labs with Precautions )  Once,   STAT    Question:  Patient immune status  Answer:  Normal   12/19/18 1444   Signed and Held  Basic metabolic panel  Tomorrow morning,   R     Signed and Held   Signed and Held  CBC  Tomorrow morning,   R     Signed and Held   Signed and Held  Basic metabolic panel  Once,   R     Signed and Held   Signed and Held  Lactate dehydrogenase  Once,   R     Signed and Held   Signed and Held  Haptoglobin  Once,   R     Signed and Held   Signed and Held  Bilirubin, fractionated(tot/dir/indir)  Once,   R     Signed and Held          Vitals/Pain Today's Vitals   12/19/18 1356 12/19/18 1359  BP: (!) 130/53   Pulse: 89   Resp: 20   Temp: 98.3 F (36.8 C)   TempSrc: Oral   SpO2: 100%   Weight:  83 kg  Height:  5\' 4"  (1.626 m)  PainSc:  0-No pain    Isolation Precautions Airborne and Contact precautions  Medications Medications  sodium bicarbonate  injection 50 mEq (has no administration in time range)  patiromer Daryll Drown) packet 25.2 g (has no administration in time range)  sodium chloride 0.9 % bolus 500 mL (has no administration in time range)  calcium gluconate inj 10% (1 g) URGENT USE ONLY! (1 g Intravenous Given 12/19/18 1507)  insulin aspart (novoLOG) injection 5 Units (5 Units Intravenous Given 12/19/18 1500)  dextrose 50 % solution 50 mL (50 mLs Intravenous Given 12/19/18 1457)    Mobility walks Low fall risk   Focused Assessments Cardiac Assessment Handoff:    No results found for: CKTOTAL, CKMB, CKMBINDEX, TROPONINI No results found for: DDIMER Does the Patient currently have chest pain? No      R Recommendations: See Admitting Provider Note  Report given to:   Additional Notes:

## 2018-12-19 NOTE — Consult Note (Signed)
Name: Angela Russell MRN: [id number] DOB: Oct 29, 1950     CONSULTATION DATE: 12/19/2018  REFERRING MD : [facility]  CHIEF COMPLAINT: acute hyperkalemia   HISTORY OF PRESENT ILLNESS: 68 y.o. female with a known history of bladder cancer s/p cystectomy with urostomy bag, hypertension, type 2 diabetes, CKD 3, hyperlipidemia who was sent to the ED by her PCP due to outpatient labs showing a potassium of 9.1.    Patient states she has not been feeling well for the last week. She endorses overall muscle weakness and fatigue.  She also endorses some generalized abdominal pain.  She denies any palpitations, chest pain, shortness of breath.  In the ED, she was mildly tachycardic Labs were significant for K > 7.5 creatinine 2.33 Hemoglobin 8.3.   EKG with marked sinus arrhythmia  Therapy in ER- -Calcium gluconate -NovoLog -Veltassa -sodium bicarb.    Admitted to [facility] for further assessment   PAST MEDICAL HISTORY :   has a past medical history of Carotid artery plaque, right (01/2014), CKD (chronic kidney disease), Diabetes mellitus without complication ([place]), DM (diabetes mellitus), type 2, uncontrolled ([place]), Hyperlipidemia, Hypertension, Hypochromic microcytic anemia, Osteoporosis, Renal insufficiency, and Rotator cuff tendonitis, right.  has a past surgical history that includes Knee surgery (Left, 03/17/2013); Abdominal hysterectomy (2000); Transurethral resection of bladder tumor (N/A, 07/16/2018); Cystoscopy w/ retrogrades (Bilateral, 07/16/2018); and PORTA CATH INSERTION (N/A, 08/06/2018). Prior to Admission medications   Medication Sig Start Date End Date Taking? Authorizing Provider  amLODipine (NORVASC) 5 MG tablet Take 1 tablet (5 mg total) by mouth daily. 12/18/18  Yes Volney American, PA-C  diclofenac sodium (VOLTAREN) 1 % GEL Apply 2 g topically 4 (four) times daily. 12/18/18  Yes Volney American, PA-C  lisinopril-hydrochlorothiazide (ZESTORETIC) 20-12.5 MG tablet  Take 1 tablet by mouth 2 (two) times daily. 09/19/18  Yes Volney American, PA-C  metFORMIN (GLUCOPHAGE) 1000 MG tablet TAKE 1 TABLET(1000 MG) BY MOUTH TWICE DAILY 10/26/18  Yes Volney American, PA-C  pioglitazone (ACTOS) 15 MG tablet Take 1 tablet (15 mg total) by mouth daily. 12/18/18  Yes Volney American, PA-C  pravastatin (PRAVACHOL) 10 MG tablet Take 1 tablet (10 mg total) by mouth at bedtime. 12/18/18  Yes Volney American, PA-C  prochlorperazine (COMPAZINE) 10 MG tablet Take 1 tablet (10 mg total) by mouth every 6 (six) hours as needed (Nausea or vomiting). 08/03/18 08/26/18  [name], MD   No Known Allergies  FAMILY HISTORY:  family history includes Arthritis in her father; Diabetes in her brother; Heart attack in her mother; Heart disease in her mother. SOCIAL HISTORY:  reports that she quit smoking about 27 years ago. She has never used smokeless tobacco. She reports that she does not drink alcohol or use drugs.    Review of Systems:  Gen:  Denies  fever, sweats, chills weigh loss +fatigue HEENT: Denies blurred vision, double vision, ear pain, eye pain, hearing loss, nose bleeds, sore throat Cardiac:  No dizziness, chest pain or heaviness, chest tightness,edema, No JVD Resp:   No cough, -sputum production, -shortness of breath,-wheezing, -hemoptysis,  Gi: Denies swallowing difficulty, stomach pain, nausea or vomiting, diarrhea, constipation, bowel incontinence Gu:  Denies bladder incontinence, burning urine Ext:   Denies Joint pain, stiffness or swelling Skin: Denies  skin rash, easy bruising or bleeding or hives Endoc:  Denies polyuria, polydipsia , polyphagia or weight change Psych:   Denies depression, insomnia or hallucinations  Other:  All other systems negative  VITAL SIGNS: Temp:  [98.3 F (36.8 C)] 98.3 F (36.8 C) (07/17 1356) Pulse Rate:  [89] 89 (07/17 1356) Resp:  [20] 20 (07/17 1356) BP: (130)/(53) 130/53 (07/17 1356) SpO2:  [100 %]  100 % (07/17 1356) Weight:  [83 kg] 83 kg (07/17 1359)     SpO2: 100 %  CBC    Component Value Date/Time   WBC 6.0 12/19/2018 1446   RBC 3.32 (L) 12/19/2018 1446   HGB 8.3 (L) 12/19/2018 1446   HGB 10.7 (L) 01/11/2017 1529   HCT 27.1 (L) 12/19/2018 1446   HCT 34.4 01/11/2017 1529   PLT 346 12/19/2018 1446   PLT 292 01/11/2017 1529   MCV 81.6 12/19/2018 1446   MCV 77 (L) 01/11/2017 1529   MCH 25.0 (L) 12/19/2018 1446   MCHC 30.6 12/19/2018 1446   RDW 17.4 (H) 12/19/2018 1446   RDW 14.8 01/11/2017 1529   LYMPHSABS 1.1 12/19/2018 1446   LYMPHSABS 1.9 01/11/2017 1529   MONOABS 0.4 12/19/2018 1446   EOSABS 0.1 12/19/2018 1446   EOSABS 0.1 01/11/2017 1529   BASOSABS 0.0 12/19/2018 1446   BASOSABS 0.0 01/11/2017 1529   BMP Latest Ref Rng & Units 12/19/2018 12/18/2018 10/13/2018  Glucose 70 - 99 mg/dL 92 89 153(H)  BUN 8 - 23 mg/dL 40(H) 38(H) 36(H)  Creatinine 0.44 - 1.00 mg/dL 2.33(H) 2.04(H) 2.29(H)  BUN/Creat Ratio 12 - 28 - 19 -  Sodium 135 - 145 mmol/L 136 139 132(L)  Potassium 3.5 - 5.1 mmol/L >7.5(HH) 9.1(HH) 3.2(L)  Chloride 98 - 111 mmol/L 112(H) 113(H) 95(L)  CO2 22 - 32 mmol/L 16(L) 14(L) 24  Calcium 8.9 - 10.3 mg/dL 9.7 10.2 7.9(L)     COVID-19 NEGATIVE: Acute COVID-19 infection ruled out by PCR.    Physical Examination:   GENERAL:NAD, no fevers, chills, no weakness no fatigue HEAD: Normocephalic, atraumatic.  EYES: PERLA, EOMI No scleral icterus.  NECK: Supple. No thyromegaly.  No JVD.  PULMONARY: CTA B/L no wheezing, rhonchi, crackles CARDIOVASCULAR: S1 and S2. Regular rate and rhythm. No murmurs GASTROINTESTINAL: Soft, nontender, nondistended. Positive bowel sounds.  MUSCULOSKELETAL: No swelling, clubbing, or edema.  NEUROLOGIC: No gross focal neurological deficits. 5/5 strength all extremities SKIN: No ulceration, lesions, rashes, or cyanosis.  PSYCHIATRIC: Insight, judgment intact. -depression -anxiety ALL OTHER ROS ARE NEGATIVE   MEDICATIONS: I  have reviewed all medications and confirmed regimen as documented    CULTURE RESULTS   Recent Results (from the past 240 hour(s))  SARS Coronavirus 2 (CEPHEID- Performed in [facility] hospital lab), Hosp Order     Status: None   Collection Time: 12/19/18  2:47 PM   Specimen: Nasopharyngeal Swab  Result Value Ref Range Status   SARS Coronavirus 2 NEGATIVE NEGATIVE Final    Comment: (NOTE) If result is NEGATIVE SARS-CoV-2 target nucleic acids are NOT DETECTED. The SARS-CoV-2 RNA is generally detectable in upper and lower  respiratory specimens during the acute phase of infection. The lowest  concentration of SARS-CoV-2 viral copies this assay can detect is 250  copies / mL. A negative result does not preclude SARS-CoV-2 infection  and should not be used as the sole basis for treatment or other  patient management decisions.  A negative result may occur with  improper specimen collection / handling, submission of specimen other  than nasopharyngeal swab, presence of viral mutation(s) within the  areas targeted by this assay, and inadequate number of viral copies  (<250 copies / mL). A negative result must be combined  with clinical  observations, patient history, and epidemiological information. If result is POSITIVE SARS-CoV-2 target nucleic acids are DETECTED. The SARS-CoV-2 RNA is generally detectable in upper and lower  respiratory specimens dur ing the acute phase of infection.  Positive  results are indicative of active infection with SARS-CoV-2.  Clinical  correlation with patient history and other diagnostic information is  necessary to determine patient infection status.  Positive results do  not rule out bacterial infection or co-infection with other viruses. If result is PRESUMPTIVE POSTIVE SARS-CoV-2 nucleic acids MAY BE PRESENT.   A presumptive positive result was obtained on the submitted specimen  and confirmed on repeat testing.  While 2019 novel coronavirus   (SARS-CoV-2) nucleic acids may be present in the submitted sample  additional confirmatory testing may be necessary for epidemiological  and / or clinical management purposes  to differentiate between  SARS-CoV-2 and other Sarbecovirus currently known to infect humans.  If clinically indicated additional testing with an alternate test  methodology [phone]) is advised. The SARS-CoV-2 RNA is generally  detectable in upper and lower respiratory sp ecimens during the acute  phase of infection. The expected result is Negative. Fact Sheet for Patients:  StrictlyIdeas.no Fact Sheet for Healthcare Providers: BankingDealers.co.za This test is not yet approved or cleared by the [place] FDA and has been authorized for detection and/or diagnosis of SARS-CoV-2 by FDA under an Emergency Use Authorization (EUA).  This EUA will remain in effect (meaning this test can be used) for the duration of the COVID-19 declaration under Section 564(b)(1) of the Act, 21 U.S.C. section 360bbb-3(b)(1), unless the authorization is terminated or revoked sooner. Performed at [facility], [address], [place], [place] [address]           IMAGING    US Renal  Result Date: 12/19/2018 CLINICAL DATA:  Hyperkalemia. EXAM: RENAL / URINARY TRACT ULTRASOUND COMPLETE COMPARISON:  CT abdomen pelvis 10/13/2018 FINDINGS: Right Kidney: Renal measurements: 9.7 x 3.9 x 4.7 cm = volume: 95 mL . Echogenicity within normal limits. No mass or hydronephrosis visualized. Left Kidney: Renal measurements: 10.0 x 4.5 x 5.6 cm = volume: 134 mL. Echogenicity within normal limits. No mass or hydronephrosis visualized. Bladder: Previous cystectomy. IMPRESSION: Kidneys normal size without hydronephrosis. Previous cystectomy. Electronically Signed   By: Marin Olp M.D.   On: 12/19/2018 17:30       ASSESSMENT AND PLAN SYNOPSIS  ACUTE KIDNEY INJURY/Renal Failure with  severe Hyperkalemia Baseline Creatinine 1.5 ?etiology?obstructive uropathy -follow chem 7-treat accordingly -follow UO -continue Foley Catheter-assess need -Avoid nephrotoxic agents -Renal ultrasound -Nephrology consulted -Urology Consulted -Continue IVF's    History of bladder cancer s/p cystectomy with urostomy bag in place Follows with [place]. -Urology consulted  ELECTROLYTES -follow labs as needed -replace as needed -pharmacy consultation and following    DVT/GI PRX ordered TRANSFUSIONS AS NEEDED MONITOR FSBS ASSESS the need for LABS as needed   Corrin Parker, M.D.  [facility]  Medical Director [place] Director [facility] Cardio-Pulmonary Department

## 2018-12-19 NOTE — ED Notes (Signed)
Patient transported to Ultrasound 

## 2018-12-19 NOTE — ED Notes (Signed)
Sent green and purple top tubes to lab. 

## 2018-12-19 NOTE — Telephone Encounter (Signed)
Please call patient and let her know that her potassium is critically high and she will need to go to the ER right away to recheck and treat this if needed

## 2018-12-19 NOTE — Progress Notes (Signed)
Family Meeting Note  Advance Directive:no  Today a meeting took place with the Patient.  Patient is able to participate.  The following clinical team members were present during this meeting:MD  The following were discussed:Patient's diagnosis: hyperkalemia, Patient's progosis: Unable to determine and Goals for treatment: Full Code  Additional follow-up to be provided: prn  Time spent during discussion:20 minutes  Evette Doffing, MD

## 2018-12-19 NOTE — Consult Note (Signed)
[address] [place], [place] [address] Phone [phone]. 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  Outpatient labs showed potassium of 9.1 therefore she was referred to the emergency room.  Repeat potassium was greater than 7.5 and she was provided shifting measures in the ER.  EKG showed arrhythmia. Admitted for further evaluation and management  Renal ultrasound shows kidneys are normal in size without hydronephrosis.  Previous Cystectomy  Outpatient medication list included lisinopril/HCTZ, metformin She said she was feeling not herself for the past 2 weeks.  Maybe a little week yesterday.  He was trying to drink good amount of electrolyte water   Medications: Outpatient medications: Medications Prior to Admission  Medication Sig Dispense Refill Last Dose  . amLODipine (NORVASC) 5 MG tablet Take 1 tablet (5 mg total) by mouth daily. 90 tablet 1 12/18/2018 at Unknown time  . diclofenac sodium (VOLTAREN) 1 % GEL Apply 2 g topically 4 (four) times daily. 100 g 2 prn at prn  . lisinopril-hydrochlorothiazide (ZESTORETIC) 20-12.5 MG tablet Take 1 tablet by mouth 2 (two) times daily. 180 tablet 1 12/18/2018 at Unknown time  . metFORMIN (GLUCOPHAGE) 1000 MG tablet TAKE 1 TABLET(1000 MG) BY MOUTH TWICE DAILY 180 tablet 1 12/18/2018 at  Unknown time  . pioglitazone (ACTOS) 15 MG tablet Take 1 tablet (15 mg total) by mouth daily. 90 tablet 1 12/18/2018 at Unknown time  . pravastatin (PRAVACHOL) 10 MG tablet Take 1 tablet (10 mg total) by mouth at bedtime. 90 tablet 1 12/18/2018 at Unknown time    Current medications: Current Facility-Administered Medications  Medication Dose Route Frequency Provider Last Rate Last Dose  . acetaminophen (TYLENOL) tablet 650 mg  650 mg Oral Q6H PRN Mayo, Pete Pelt, MD       Or  . acetaminophen (TYLENOL) suppository 650 mg  650 mg Rectal Q6H PRN Mayo, Pete Pelt, MD      . heparin injection 5,000 Units  5,000 Units Subcutaneous Q8H Mayo, Pete Pelt, MD      . insulin aspart (novoLOG) injection 0-5 Units  0-5 Units Subcutaneous QHS Mayo, Pete Pelt, MD      . Derrill Memo ON 12/20/2018] insulin aspart (novoLOG) injection 0-9 Units  0-9 Units Subcutaneous TID WC Mayo, Pete Pelt, MD      . ondansetron [facility]) tablet 4 mg  4 mg Oral Q6H PRN Mayo, Pete Pelt, MD       Or  . ondansetron [facility]) injection 4 mg  4 mg Intravenous Q6H PRN Mayo, Pete Pelt, MD      . patiromer [facility]ary[facility] Drown) packet 25.2 g  25.2 g Oral Daily Mayo, Pete Pelt, MD   25.2 g at 12/19/18 1632  . polyethylene glycol (MIRALAX / GLYCOLAX) packet 17 g  17 g Oral Daily PRN Mayo, Pete Pelt, MD      . pravastatin (PRAVACHOL) tablet 10 mg  10 mg Oral QHS Mayo, Pete Pelt, MD          Allergies: No Known Allergies    Past Medical History: Past Medical History:  Diagnosis Date  . Carotid artery plaque, right 01/2014  . CKD (chronic kidney disease)    stage 2-3  . Diabetes mellitus without complication ([place])   . DM (diabetes mellitus), type 2, uncontrolled ([place])   . Hyperlipidemia   . Hypertension   . Hypochromic microcytic anemia    mild  . Osteoporosis   . Renal insufficiency   . Rotator cuff tendonitis, right      Past Surgical History: Past Surgical History:  Procedure Laterality Date  . ABDOMINAL HYSTERECTOMY  2000   due to  bleeding and fibroids, partial- still has ovaries  . CYSTOSCOPY W/ RETROGRADES Bilateral 07/16/2018   Procedure: CYSTOSCOPY WITH RETROGRADE PYELOGRAM;  Surgeon: Billey Co, MD;  Location: [facility] ORS;  Service: Urology;  Laterality: Bilateral;  . KNEE SURGERY Left 03/17/2013   torn meniscus  . PORTA CATH INSERTION N/A 08/06/2018   Procedure: PORTA CATH INSERTION;  Surgeon: Algernon Huxley, MD;  Location: [place] CV LAB;  Service: Cardiovascular;  Laterality: N/A;  . TRANSURETHRAL RESECTION OF BLADDER TUMOR N/A 07/16/2018   Procedure: TRANSURETHRAL RESECTION OF BLADDER TUMOR (TURBT);  Surgeon: Billey Co, MD;  Location: [facility] ORS;  Service: Urology;  Laterality: N/A;     Family History: Family History  Problem Relation Age of Onset  . Heart disease Mother   . Heart attack Mother   . Arthritis Father   . Diabetes Brother      Social History: Social History   Socioeconomic History  . Marital status: Single    Spouse name: Not on file  . Number of children: 1  . Years of education: Not on file  . Highest education level: Not on file  Occupational History  . Not on file  Social Needs  . Financial resource strain: Not hard at all  . Food insecurity    Worry: Never true    Inability: Never true  . Transportation needs    Medical: No    Non-medical: No  Tobacco Use  . Smoking status: Former Smoker    Quit date: 06/05/1991    Years since quitting: 27.5  . Smokeless tobacco: Never Used  Substance and Sexual Activity  . Alcohol use: No  . Drug use: No  . Sexual activity: Never  Lifestyle  . Physical activity    Days per week: 2 days    Minutes per session: 30 min  . Stress: Not at all  Relationships  . Social connections    Talks on phone: More than three times a week    Gets together: More than three times a week    Attends religious service: More than 4 times per year    Active member of club or organization: Yes    Attends meetings of clubs or organizations:  More than 4 times per year    Relationship status: Separated  . Intimate partner violence    Fear of current or ex partner: No    Emotionally abused: No    Physically abused: No    Forced sexual  activity: No  Other Topics Concern  . Not on file  Social History Narrative   Working full time    Gen: Denies any fevers or chills, generalized waekness HEENT: No vision or hearing problems CV: No chest pain or shortness of breath Resp: No cough or sputum production GI: No nausea, vomiting or diarrhea.  No blood in the stool GU : No problems with voiding.  No hematuria.  No previous history of kidney stones.  MS:  Denies any acute joint pain or swelling. Left knee arthritic pain.  Derm:   No complaints Psych: No complaints Heme: No complaints Neuro: No complaints Endocrine: No complaints   Vital Signs: Blood pressure (!) 156/62, pulse (!) 103, temperature 98.3 F (36.8 C), temperature source Oral, resp. rate 17, height 5\' 4"  (1.626 m), weight 80.6 kg, SpO2 100 %.  No intake or output data in the 24 hours ending 12/19/18 1812  Weight trends: Danley Danker Weights   12/19/18 1359 12/19/18 1743  Weight: 83 kg 80.6 kg   Gen:   Alert, cooperative, no distress, appears stated age Head:   Normocephalic, without obvious abnormality, atraumatic Eyes/ENT:  conjunctiva/corneas clear,  moist oral mucus membranes Neck:  Supple,  thyroid: not enlarged, no JVD Back:    no CVA tenderness Lungs:   Clear to auscultation bilaterally, respirations unlabored Heart:   Regular rate and rhythm, S1, S2 normal, no murmur, rub or gallop Abdomen:   Soft, non-tender, urostomy with clear yellow urine Extremities: no cyanosis or edema Skin:  Skin color, texture, turgor normal, no rashes or lesions Neurologic: Alert and oriented, able to answer questions appropriately   Lab results: Basic Metabolic Panel: Recent Labs  Lab 12/18/18 1740 12/19/18 1446  NA 139 136  K 9.1* >7.5*  CL 113* 112*  CO2 14* 16*   GLUCOSE 89 92  BUN 38* 40*  CREATININE 2.04* 2.33*  CALCIUM 10.2 9.7    Liver Function Tests: Recent Labs  Lab 12/18/18 1740  AST 14  ALT 8  ALKPHOS 96  BILITOT 0.2  PROT 7.5  ALBUMIN 4.5   No results for input(s): LIPASE, AMYLASE in the last 168 hours. No results for input(s): AMMONIA in the last 168 hours.  CBC: Recent Labs  Lab 12/19/18 1446  WBC 6.0  NEUTROABS 4.3  HGB 8.3*  HCT 27.1*  MCV 81.6  PLT 346    Cardiac Enzymes: No results for input(s): CKTOTAL, TROPONINI in the last 168 hours.  BNP: Invalid input(s): POCBNP  CBG: No results for input(s): GLUCAP in the last 168 hours.  Microbiology: Recent Results (from the past 720 hour(s))  SARS Coronavirus 2 (CEPHEID- Performed in [place] hospital lab), Hosp Order     Status: None   Collection Time: 12/19/18  2:47 PM   Specimen: Nasopharyngeal Swab  Result Value Ref Range Status   SARS Coronavirus 2 NEGATIVE NEGATIVE Final    Comment: (NOTE) If result is NEGATIVE SARS-CoV-2 target nucleic acids are NOT DETECTED. The SARS-CoV-2 RNA is generally detectable in upper and lower  respiratory specimens during the acute phase of infection. The lowest  concentration of SARS-CoV-2 viral copies this assay can detect is 250  copies / mL. A negative result does not preclude SARS-CoV-2 infection  and should not be used as the sole basis for treatment or other  patient management decisions.  A negative result may occur with  improper specimen collection / handling, submission of specimen other  than nasopharyngeal swab, presence of viral mutation(s) within the  areas targeted by this assay, and inadequate number of viral copies  (<250 copies / mL). A negative result must be combined with clinical  observations, patient history, and epidemiological information. If result is POSITIVE SARS-CoV-2 target nucleic acids are DETECTED. The SARS-CoV-2 RNA is generally detectable in upper and lower  respiratory specimens  dur ing the acute phase of infection.  Positive  results are indicative of active infection with SARS-CoV-2.  Clinical  correlation with patient history and other diagnostic information is  necessary to determine patient infection status.  Positive results do  not rule out bacterial infection or co-infection with other viruses. If result is PRESUMPTIVE POSTIVE SARS-CoV-2 nucleic acids MAY BE PRESENT.   A presumptive positive result was obtained on the submitted specimen  and confirmed on repeat testing.  While 2019 novel coronavirus  (SARS-CoV-2) nucleic acids may be present in the submitted sample  additional confirmatory testing may be necessary for epidemiological  and / or clinical management purposes  to differentiate between  SARS-CoV-2 and other Sarbecovirus currently known to infect humans.  If clinically indicated additional testing with an alternate test  methodology [phone]) is advised. The SARS-CoV-2 RNA is generally  detectable in upper and lower respiratory sp ecimens during the acute  phase of infection. The expected result is Negative. Fact Sheet for Patients:  StrictlyIdeas.no Fact Sheet for Healthcare Providers: BankingDealers.co.za This test is not yet approved or cleared by the [place] FDA and has been authorized for detection and/or diagnosis of SARS-CoV-2 by FDA under an Emergency Use Authorization (EUA).  This EUA will remain in effect (meaning this test can be used) for the duration of the COVID-19 declaration under Section 564(b)(1) of the Act, 21 U.S.C. section 360bbb-3(b)(1), unless the authorization is terminated or revoked sooner. Performed at [facility], [address] [place], [place] [address]      Coagulation Studies: No results for input(s): LABPROT, INR in the last 72 hours.  Urinalysis: No results for input(s): COLORURINE, LABSPEC, PHURINE, GLUCOSEU, HGBUR, BILIRUBINUR,  KETONESUR, PROTEINUR, UROBILINOGEN, NITRITE, LEUKOCYTESUR in the last 72 hours.  Invalid input(s): APPERANCEUR      Imaging: US Renal  Result Date: 12/19/2018 CLINICAL DATA:  Hyperkalemia. EXAM: RENAL / URINARY TRACT ULTRASOUND COMPLETE COMPARISON:  CT abdomen pelvis 10/13/2018 FINDINGS: Right Kidney: Renal measurements: 9.7 x 3.9 x 4.7 cm = volume: 95 mL . Echogenicity within normal limits. No mass or hydronephrosis visualized. Left Kidney: Renal measurements: 10.0 x 4.5 x 5.6 cm = volume: 134 mL. Echogenicity within normal limits. No mass or hydronephrosis visualized. Bladder: Previous cystectomy. IMPRESSION: Kidneys normal size without hydronephrosis. Previous cystectomy. Electronically Signed   By: Marin Olp M.D.   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    Acute kidney injury on chronic kidney disease stage III, acidosis Baseline creatinine 1.58/GFR 33 from August 28, 2018 Admission creatinine of 2.33/GFR 21.  Potassium greater than 7.5.  Bicarb low at 16.  Renal ultrasound is negative for obstruction.  Yesterday's labs show elevated creatinine of albumin of 4.5 which is almost doubled compared to 2.7 from May  11 Recent CT of the abdomen from May 11 show postoperative changes from cystectomy with diverting loop ileostomy.  Mild to moderate bilateral hydronephrosis with left sided nephroureteral stent in place.  No obstructing mass identified.  Bilateral external iliac and left pelvic sidewall fluid density structures were  identified  Plan: Serial potassium checks Since patient is nonoliguric, potassium level may improve with IV hydration and shifting measures.  She is scheduled to get Veltassa daily. Patient did have arrhythmias earlier with peak T waves.  Currently she has normal sinus rhythm.  No palpitations.  No arrhythmias are present. If potassium does not correct with conservative measures, we may have to do urgent dialysis.  Continue to monitor closely for now.  Will start IV bicarb infusion to correct acidosis and assist with treating hyperkalemia.      LOS: 0 Della Homan 7/17/20206:12 PM    Note: This note was prepared with Dragon dictation. Any transcription errors are unintentional

## 2018-12-19 NOTE — ED Triage Notes (Signed)
Patient states she was sent by PCP due to elevated potassium. States she has had minimal shortness of breath and has had some constipation for the last week. Patient also reports "I just don't feel good" however reports no specific symptoms.

## 2018-12-20 LAB — CBC
HCT: 22.7 % — ABNORMAL LOW (ref 36.0–46.0)
Hemoglobin: 7.1 g/dL — ABNORMAL LOW (ref 12.0–15.0)
MCH: 24.8 pg — ABNORMAL LOW (ref 26.0–34.0)
MCHC: 31.3 g/dL (ref 30.0–36.0)
MCV: 79.4 fL — ABNORMAL LOW (ref 80.0–100.0)
Platelets: 264 10*3/uL (ref 150–400)
RBC: 2.86 MIL/uL — ABNORMAL LOW (ref 3.87–5.11)
RDW: 17.1 % — ABNORMAL HIGH (ref 11.5–15.5)
WBC: 5.2 10*3/uL (ref 4.0–10.5)
nRBC: 0 % (ref 0.0–0.2)

## 2018-12-20 LAB — COMPREHENSIVE METABOLIC PANEL
ALT: 9 U/L (ref 0–44)
ALT: 9 U/L (ref 0–44)
AST: 16 U/L (ref 15–41)
AST: 17 U/L (ref 15–41)
Albumin: 3.3 g/dL — ABNORMAL LOW (ref 3.5–5.0)
Albumin: 3.6 g/dL (ref 3.5–5.0)
Alkaline Phosphatase: 67 U/L (ref 38–126)
Alkaline Phosphatase: 69 U/L (ref 38–126)
Anion gap: 6 (ref 5–15)
Anion gap: 7 (ref 5–15)
BUN: 30 mg/dL — ABNORMAL HIGH (ref 8–23)
BUN: 33 mg/dL — ABNORMAL HIGH (ref 8–23)
CO2: 19 mmol/L — ABNORMAL LOW (ref 22–32)
CO2: 22 mmol/L (ref 22–32)
Calcium: 9 mg/dL (ref 8.9–10.3)
Calcium: 9.4 mg/dL (ref 8.9–10.3)
Chloride: 111 mmol/L (ref 98–111)
Chloride: 112 mmol/L — ABNORMAL HIGH (ref 98–111)
Creatinine, Ser: 1.76 mg/dL — ABNORMAL HIGH (ref 0.44–1.00)
Creatinine, Ser: 1.8 mg/dL — ABNORMAL HIGH (ref 0.44–1.00)
GFR calc Af Amer: 33 mL/min — ABNORMAL LOW (ref >60)
GFR calc Af Amer: 34 mL/min — ABNORMAL LOW (ref >60)
GFR calc non Af Amer: 28 mL/min — ABNORMAL LOW (ref >60)
GFR calc non Af Amer: 29 mL/min — ABNORMAL LOW (ref >60)
Glucose, Bld: 106 mg/dL — ABNORMAL HIGH (ref 70–99)
Glucose, Bld: 87 mg/dL (ref 70–99)
Potassium: 5 mmol/L (ref 3.5–5.1)
Potassium: 5.7 mmol/L — ABNORMAL HIGH (ref 3.5–5.1)
Sodium: 138 mmol/L (ref 135–145)
Sodium: 139 mmol/L (ref 135–145)
Total Bilirubin: 0.3 mg/dL (ref 0.3–1.2)
Total Bilirubin: 0.5 mg/dL (ref 0.3–1.2)
Total Protein: 6.3 g/dL — ABNORMAL LOW (ref 6.5–8.1)
Total Protein: 6.7 g/dL (ref 6.5–8.1)

## 2018-12-20 LAB — GLUCOSE, CAPILLARY
Glucose-Capillary: 94 mg/dL (ref 70–99)
Glucose-Capillary: 115 mg/dL — ABNORMAL HIGH (ref 70–99)
Glucose-Capillary: 83 mg/dL (ref 70–99)
Glucose-Capillary: 105 mg/dL — ABNORMAL HIGH (ref 70–99)

## 2018-12-20 LAB — MAGNESIUM: Magnesium: 1.9 mg/dL (ref 1.7–2.4)

## 2018-12-20 LAB — PHOSPHORUS: Phosphorus: 5.4 mg/dL — ABNORMAL HIGH (ref 2.5–4.6)

## 2018-12-20 MED ORDER — CHLORHEXIDINE GLUCONATE CLOTH 2 % EX PADS: 6.0000 | MEDICATED_PAD | Freq: Every day | CUTANEOUS | Status: DC

## 2018-12-20 MED ORDER — FERROUS SULFATE 325 (65 FE) MG PO TABS
325.0000 mg | ORAL_TABLET | Freq: Two times a day (BID) | ORAL | Status: DC
  Administered 2018-12-20 – 2018-12-21 (×3): 325 mg via ORAL
  Filled 2018-12-20 (×3): qty 1

## 2018-12-20 NOTE — Progress Notes (Signed)
[facility], [place] 12/20/18  Subjective:   Potassium level is now corrected Nonoliguric Serum creatinine has also improved Patient feels well this morning Report problems passing gas since her surgery  Objective:  Vital signs in last 24 hours:  Temp:  [98.2 F (36.8 C)-98.6 F (37 C)] 98.4 F (36.9 C) (07/18 0700) Pulse Rate:  [75-107] 98 (07/18 0800) Resp:  [13-21] 13 (07/18 0800) BP: (92-156)/(53-78) 132/68 (07/18 0800) SpO2:  [100 %] 100 % (07/18 0800) Weight:  [80.6 kg-83 kg] 80.6 kg (07/17 1743)  Weight change:  Filed Weights   12/19/18 1359 12/19/18 1743  Weight: 83 kg 80.6 kg    Intake/Output:    Intake/Output Summary (Last 24 hours) at 12/20/2018 0912 Last data filed at 12/20/2018 0800 Gross per 24 hour  Intake 1090.46 ml  Output 1600 ml  Net -509.54 ml    Gen:   Alert, cooperative, no distress, appears stated age Head:   Normocephalic, without obvious abnormality, atraumatic Eyes/ENT:  conjunctiva/corneas clear,  moist oral mucus membranes Neck:  Supple,  thyroid: not enlarged, no JVD Lungs:   Clear to auscultation bilaterally, respirations unlabored Heart:   Regular rate and rhythm, S1, S2 normal, no murmur, rub or gallop Abdomen:   Soft, non-tender, bowel sounds active  Extremities: no cyanosis or edema Skin:  Skin color, texture, turgor normal, no rashes or lesions Neurologic: Alert and oriented, able to answer questions appropriately     Basic Metabolic Panel:  Recent Labs  Lab 12/18/18 1740 12/19/18 1446 12/19/18 1841 12/20/18 0004 12/20/18 0514  NA 139 136 137 138 139  K 9.1* >7.5* 6.3* 5.7* 5.0  CL 113* 112* 115* 112* 111  CO2 14* 16* 15* 19* 22  GLUCOSE 89 92 129* 87 106*  BUN 38* 40* 37* 33* 30*  CREATININE 2.04* 2.33* 2.03* 1.80* 1.76*  CALCIUM 10.2 9.7 9.7 9.4 9.0  MG  --   --   --  1.9  --   PHOS  --   --   --  5.4*  --      CBC: Recent Labs  Lab 12/19/18 1446 12/20/18 0514  WBC 6.0 5.2   NEUTROABS 4.3  --   HGB 8.3* 7.1*  HCT 27.1* 22.7*  MCV 81.6 79.4*  PLT 346 264     No results found for: HEPBSAG, HEPBSAB, HEPBIGM    Microbiology:  Recent Results (from the past 240 hour(s))  SARS Coronavirus 2 (CEPHEID- Performed in [place] hospital lab), Hosp Order     Status: None   Collection Time: 12/19/18  2:47 PM   Specimen: Nasopharyngeal Swab  Result Value Ref Range Status   SARS Coronavirus 2 NEGATIVE NEGATIVE Final    Comment: (NOTE) If result is NEGATIVE SARS-CoV-2 target nucleic acids are NOT DETECTED. The SARS-CoV-2 RNA is generally detectable in upper and lower  respiratory specimens during the acute phase of infection. The lowest  concentration of SARS-CoV-2 viral copies this assay can detect is 250  copies / mL. A negative result does not preclude SARS-CoV-2 infection  and should not be used as the sole basis for treatment or other  patient management decisions.  A negative result may occur with  improper specimen collection / handling, submission of specimen other  than nasopharyngeal swab, presence of viral mutation(s) within the  areas targeted by this assay, and inadequate number of viral copies  (<250 copies / mL). A negative result must be combined with clinical  observations, patient history, and epidemiological  information. If result is POSITIVE SARS-CoV-2 target nucleic acids are DETECTED. The SARS-CoV-2 RNA is generally detectable in upper and lower  respiratory specimens dur ing the acute phase of infection.  Positive  results are indicative of active infection with SARS-CoV-2.  Clinical  correlation with patient history and other diagnostic information is  necessary to determine patient infection status.  Positive results do  not rule out bacterial infection or co-infection with other viruses. If result is PRESUMPTIVE POSTIVE SARS-CoV-2 nucleic acids MAY BE PRESENT.   A presumptive positive result was obtained on the submitted specimen   and confirmed on repeat testing.  While 2019 novel coronavirus  (SARS-CoV-2) nucleic acids may be present in the submitted sample  additional confirmatory testing may be necessary for epidemiological  and / or clinical management purposes  to differentiate between  SARS-CoV-2 and other Sarbecovirus currently known to infect humans.  If clinically indicated additional testing with an alternate test  methodology [phone]) is advised. The SARS-CoV-2 RNA is generally  detectable in upper and lower respiratory sp ecimens during the acute  phase of infection. The expected result is Negative. Fact Sheet for Patients:  StrictlyIdeas.no Fact Sheet for Healthcare Providers: BankingDealers.co.za This test is not yet approved or cleared by the [place] FDA and has been authorized for detection and/or diagnosis of SARS-CoV-2 by FDA under an Emergency Use Authorization (EUA).  This EUA will remain in effect (meaning this test can be used) for the duration of the COVID-19 declaration under Section 564(b)(1) of the Act, 21 U.S.C. section 360bbb-3(b)(1), unless the authorization is terminated or revoked sooner. Performed at [facility], [address] [place], [place] [address]   MRSA PCR Screening     Status: None   Collection Time: 12/19/18  5:39 PM   Specimen: Nasopharyngeal  Result Value Ref Range Status   MRSA by PCR NEGATIVE NEGATIVE Final    Comment:        The GeneXpert MRSA Assay (FDA approved for NASAL specimens only), is one component of a comprehensive MRSA colonization surveillance program. It is not intended to diagnose MRSA infection nor to guide or monitor treatment for MRSA infections. Performed at [facility], [address] [place], [place] [address]     Coagulation Studies: No results for input(s): LABPROT, INR in the last 72 hours.  Urinalysis: No results for input(s): COLORURINE, LABSPEC,  PHURINE, GLUCOSEU, HGBUR, BILIRUBINUR, KETONESUR, PROTEINUR, UROBILINOGEN, NITRITE, LEUKOCYTESUR in the last 72 hours.  Invalid input(s): APPERANCEUR    Imaging: US Renal  Result Date: 12/19/2018 CLINICAL DATA:  Hyperkalemia. EXAM: RENAL / URINARY TRACT ULTRASOUND COMPLETE COMPARISON:  CT abdomen pelvis 10/13/2018 FINDINGS: Right Kidney: Renal measurements: 9.7 x 3.9 x 4.7 cm = volume: 95 mL . Echogenicity within normal limits. No mass or hydronephrosis visualized. Left Kidney: Renal measurements: 10.0 x 4.5 x 5.6 cm = volume: 134 mL. Echogenicity within normal limits. No mass or hydronephrosis visualized. Bladder: Previous cystectomy. IMPRESSION: Kidneys normal size without hydronephrosis. Previous cystectomy. Electronically Signed   By: Marin Olp M.D.   On: 12/19/2018 17:30     Medications:   .  sodium bicarbonate  infusion 1000 mL 125 mL/hr at 12/20/18 0523   . Chlorhexidine Gluconate Cloth  6 each Topical Q0600  . ferrous sulfate  325 mg Oral BID WC  . heparin  5,000 Units Subcutaneous Q8H  . insulin aspart  0-5 Units Subcutaneous QHS  . insulin aspart  0-9 Units Subcutaneous TID WC  . patiromer  25.2  g Oral Daily  . 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   Acute kidney injury Chronic kidney disease stage III-Baseline serum creatinine of 1.18/55 08/11/2018 Acidosis Severe hyperkalemia DM-2 with CKD  All above renal  parameters have improved.  Patient is nonoliguric.  Urine output 1350 cc yesterday.  Serum creatinine improved to 1.76.   Plan: May discontinue sodium bicarb infusion Ambulate patient Continue to hold lisinopril/HCTZ Consider restarting lisinopril at a lower dose if blood pressure is consistently greater than 130/80 Avoid high potassium drinks Lab Results  Component Value Date   HGBA1C 5.9 (H) 12/18/2018        LOS: [place] 7/18/20209:12 AM  [facility] [place], [place]  Note: This note was prepared with Dragon dictation. Any transcription errors are unintentional

## 2018-12-20 NOTE — Progress Notes (Signed)
1200 Report called to  Opal Sidles RN on 1C.

## 2018-12-20 NOTE — Progress Notes (Signed)
Transferred to room 130 via wheelchair.

## 2018-12-20 NOTE — Progress Notes (Signed)
Pt transferred from ICU and reports feeling much better; denies co's/concerns. Pt cares for urostomy bag; will need assistance keeping bag empty during the night since she does not have her usual dainage system with no urostomy supplies at hand. Up in chair/tolerated diet well. K+ 5.0 today. IV sodium bicarbonate infusing as ordered.

## 2018-12-20 NOTE — TOC Initial Note (Signed)
Transition of Care [facility]) - Initial/Assessment Note    Patient Details  Name: Angela Russell MRN: [id number] Date of Birth: 05-08-1951  Transition of Care [facility]) CM/SW Contact:    Zettie Pho, LCSW Phone Number: 12/20/2018, 3:34 PM  Clinical Narrative:                 The social worker contacted the patient and explained role of TOC team. The CSW provided a high risk for readmission screening. The patient has no difficulty with accessing medications or basic needs, and she has a supportive niece who transports her at discharge and when needed. The patient cares for her urostomy at home and feels confident about continuing to do so.  The [facility] team will continue to follow should needs arise.  Expected Discharge Plan: Home/Self Care Barriers to Discharge: Continued Medical Work up   Patient Goals and CMS Choice Patient states their goals for this hospitalization and ongoing recovery are:: "I want to get home and get to feeling better."   Choice offered to / list presented to : NA  Expected Discharge Plan and Services Expected Discharge Plan: Home/Self Care   Discharge Planning Services: Follow-up appt scheduled                                          Prior Living Arrangements/Services   Lives with:: Self Patient language and need for interpreter reviewed:: No Do you feel safe going back to the place where you live?: Yes      Need for Family Participation in Patient Care: No (Comment) Care giver support system in place?: Yes (comment)   Criminal Activity/Legal Involvement Pertinent to Current Situation/Hospitalization: No - Comment as needed  Activities of Daily Living Home Assistive Devices/Equipment: Other (Comment) ADL Screening (condition at time of admission) Patient's cognitive ability adequate to safely complete daily activities?: Yes Is the patient deaf or have difficulty hearing?: No Does the patient have difficulty seeing, even when wearing  glasses/contacts?: No Does the patient have difficulty concentrating, remembering, or making decisions?: No Patient able to express need for assistance with ADLs?: Yes Does the patient have difficulty dressing or bathing?: No Independently performs ADLs?: Yes (appropriate for developmental age) Does the patient have difficulty walking or climbing stairs?: No Weakness of Legs: None Weakness of Arms/Hands: None  Permission Sought/Granted Permission sought to share information with : Family Supports                Emotional Assessment Appearance:: Appears stated age Attitude/Demeanor/Rapport: Gracious Affect (typically observed): Pleasant Orientation: : Oriented to Self, Oriented to Place, Oriented to  Time, Oriented to Situation Alcohol / Substance Use: Never Used Psych Involvement: No (comment)  Admission diagnosis:  Hyperkalemia [E87.5] Patient Active Problem List   Diagnosis Date Noted  . Hyperkalemia 12/19/2018  . Fluid collection at surgical site 10/17/2018  . Tachycardia 10/15/2018  . Pyelonephritis 10/14/2018  . Iron deficiency anemia due to chronic blood loss 08/26/2018  . AKI (acute kidney injury) ([place]) 08/06/2018  . Bladder carcinoma ([place]) 08/03/2018  . Goals of care, counseling/discussion 08/03/2018  . Bladder tumor 07/16/2018  . Hyperlipidemia   . Hypertension associated with diabetes ([place])   . DM (diabetes mellitus), type 2, uncontrolled ([place])   . Renal insufficiency   . CKD (chronic kidney disease)   . Hypochromic microcytic anemia   . Chronic anemia 07/24/2016  . Uncontrolled type  2 diabetes mellitus without complication, without long-term current use of insulin ([place]) 03/11/2016  . Carotid artery plaque, right 01/02/2014  . Cervical facet joint syndrome 03/20/2013  . Spondylolisthesis of lumbar region 03/20/2013   PCP:  Volney American, PA-C Pharmacy:   [facility] [place] [facility] [address]) [place] [address] Phone: [phone] Fax: [phone]     Social Determinants of Health (SDOH) Interventions    Readmission Risk Interventions Readmission Risk Prevention Plan 12/20/2018  Transportation Screening Complete  PCP or Specialist Appt within 3-5 Days Complete  HRI or [place] Complete  Social Work Consult for [place] Planning/Counseling Complete  Palliative Care Screening Not Applicable  Medication Review Press photographer) Complete  Some recent data might be hidden

## 2018-12-20 NOTE — Progress Notes (Signed)
[place] at [place] NAME: Angela Russell    MR#:  [id number]  DATE OF BIRTH:  [birth year]  SUBJECTIVE:  CHIEF COMPLAINT:   Chief Complaint  Patient presents with  . Abnormal Lab   Came with generalized weakness and hyperkalemia with worsening renal function.  Potassium level he is in safe range now and renal function slightly improved.  Patient has complains of chronically lower abdominal and pelvic pain since she had surgeries 2 to 3 months ago.  REVIEW OF SYSTEMS:  CONSTITUTIONAL: No fever, fatigue or weakness.  EYES: No blurred or double vision.  EARS, NOSE, AND THROAT: No tinnitus or ear pain.  RESPIRATORY: No cough, shortness of breath, wheezing or hemoptysis.  CARDIOVASCULAR: No chest pain, orthopnea, edema.  GASTROINTESTINAL: No nausea, vomiting, diarrhea or abdominal pain.  GENITOURINARY: No dysuria, hematuria.  ENDOCRINE: No polyuria, nocturia,  HEMATOLOGY: No anemia, easy bruising or bleeding SKIN: No rash or lesion. MUSCULOSKELETAL: No joint pain or arthritis.   NEUROLOGIC: No tingling, numbness, weakness.  PSYCHIATRY: No anxiety or depression.   ROS  DRUG ALLERGIES:  No Known Allergies  VITALS:  Blood pressure 107/60, pulse 90, temperature 98.3 F (36.8 C), resp. rate 14, height 5\' 4"  (1.626 m), weight 80.6 kg, SpO2 100 %.  PHYSICAL EXAMINATION:  GENERAL:  68 y.o.-year-old patient lying in the bed with no acute distress. Tired-appearing, but non-toxic. EYES: Pupils equal, round, reactive to light and accommodation. No scleral icterus. Extraocular muscles intact.  HEENT: Head atraumatic, normocephalic. Oropharynx and nasopharynx clear.  NECK:  Supple, no jugular venous distention. No thyroid enlargement, no tenderness.  LUNGS: Normal breath sounds bilaterally, no wheezing, rales,rhonchi or crepitation. No use of accessory muscles of respiration.  CARDIOVASCULAR: Tachycardic, regular rhythm, S1, S2 normal. No murmurs,  rubs, or gallops.  ABDOMEN: Soft, nontender, nondistended. Bowel sounds present. No organomegaly or mass. +urostomy bag in place with small amount of urine present. EXTREMITIES: No pedal edema, cyanosis, or clubbing.  NEUROLOGIC: Cranial nerves II through XII are intact. Muscle strength 5/5 in all extremities. Sensation intact. Gait not checked.  PSYCHIATRIC: The patient is alert and oriented x 3.  SKIN: No obvious rash, lesion, or ulcer.    Physical Exam LABORATORY PANEL:   CBC Recent Labs  Lab 12/20/18 0514  WBC 5.2  HGB 7.1*  HCT 22.7*  PLT 264   ------------------------------------------------------------------------------------------------------------------  Chemistries  Recent Labs  Lab 12/20/18 0004 12/20/18 0514  NA 138 139  K 5.7* 5.0  CL 112* 111  CO2 19* 22  GLUCOSE 87 106*  BUN 33* 30*  CREATININE 1.80* 1.76*  CALCIUM 9.4 9.0  MG 1.9  --   AST 16 17  ALT 9 9  ALKPHOS 69 67  BILITOT 0.5 0.3   ------------------------------------------------------------------------------------------------------------------  Cardiac Enzymes No results for input(s): TROPONINI in the last 168 hours. ------------------------------------------------------------------------------------------------------------------  RADIOLOGY:  US Renal  Result Date: 12/19/2018 CLINICAL DATA:  Hyperkalemia. EXAM: RENAL / URINARY TRACT ULTRASOUND COMPLETE COMPARISON:  CT abdomen pelvis 10/13/2018 FINDINGS: Right Kidney: Renal measurements: 9.7 x 3.9 x 4.7 cm = volume: 95 mL . Echogenicity within normal limits. No mass or hydronephrosis visualized. Left Kidney: Renal measurements: 10.0 x 4.5 x 5.6 cm = volume: 134 mL. Echogenicity within normal limits. No mass or hydronephrosis visualized. Bladder: Previous cystectomy. IMPRESSION: Kidneys normal size without hydronephrosis. Previous cystectomy. Electronically Signed   By: Marin Olp M.D.   On: 12/19/2018 17:30    ASSESSMENT AND PLAN:    Active  Problems:   Hyperkalemia  Hyperkalemia- possibly due to obstruction (due to hx of bladder cancer), hemolysis, ACE. -s/p calcium gluconate, insulin, Veltassa, sodium bicarb given in the ED -Nephrology consult appreciated.  IV bicarb drip. -Recheck BMP at 1800 -Renal ultrasound -no hydronephrosis. -LDH, bilirubin ordered-not high. -Holding home lisinopril -Cardiac monitoring -Potassium level came down to safe range.  AKI in CKD III- baseline creatinine seems to be ~1.1-1.5 -Continue IVFs -Avoid nephrotoxic agents -Holding home lisinopril -Renal ultrasound as above -Renal function improving.  History of bladder cancer s/p cystectomy with urostomy bag in place. Follows with [facility] care -Urology consulted-no acute findings as per him.  Chronic iron deficiency anemia- hemoglobin is close to baseline. -Hemolysis labs as above -Recent anemia panel unremarkable -Monitor, replace oral iron.  Hypertension- BPs soft in the ED -Hold home BP meds, stable now.  Type 2 diabetes -Sensitive SSI  Hyperlipidemia- stable -Continue home statin    All the records are reviewed and case discussed with Care Management/Social Workerr. Management plans discussed with the patient, family and they are in agreement.  CODE STATUS: Full.  TOTAL TIME TAKING CARE OF THIS PATIENT: 35 minutes.     POSSIBLE D/C IN 1-2 DAYS, DEPENDING ON CLINICAL CONDITION.   Vaughan Basta M.D on 12/20/2018   Between 7am to 6pm - Pager - [phone]  After 6pm go to [email or web address] - password [email or web address] [place] [facility]  Office  [phone]  CC: Primary care physician; Volney American, PA-C  Note: This dictation was prepared with Dragon dictation along with smaller phrase technology. Any transcriptional errors that result from this process are unintentional.

## 2018-12-21 LAB — URINALYSIS, COMPLETE (UACMP) WITH MICROSCOPIC
Bacteria, UA: NONE SEEN
Bilirubin Urine: NEGATIVE
Glucose, UA: NEGATIVE mg/dL
Hgb urine dipstick: NEGATIVE
Ketones, ur: NEGATIVE mg/dL
Leukocytes,Ua: NEGATIVE
Nitrite: NEGATIVE
Protein, ur: NEGATIVE mg/dL
Specific Gravity, Urine: 1.002 — ABNORMAL LOW (ref 1.005–1.030)
Squamous Epithelial / HPF: NONE SEEN (ref 0–5)
pH: 8 (ref 5.0–8.0)

## 2018-12-21 LAB — CBC
HCT: 23.4 % — ABNORMAL LOW (ref 36.0–46.0)
Hemoglobin: 7.4 g/dL — ABNORMAL LOW (ref 12.0–15.0)
MCH: 24.8 pg — ABNORMAL LOW (ref 26.0–34.0)
MCHC: 31.6 g/dL (ref 30.0–36.0)
MCV: 78.5 fL — ABNORMAL LOW (ref 80.0–100.0)
Platelets: 271 10*3/uL (ref 150–400)
RBC: 2.98 MIL/uL — ABNORMAL LOW (ref 3.87–5.11)
RDW: 16.8 % — ABNORMAL HIGH (ref 11.5–15.5)
WBC: 5.6 10*3/uL (ref 4.0–10.5)
nRBC: 0 % (ref 0.0–0.2)

## 2018-12-21 LAB — BASIC METABOLIC PANEL
Anion gap: 11 (ref 5–15)
BUN: 21 mg/dL (ref 8–23)
CO2: 31 mmol/L (ref 22–32)
Calcium: 8.8 mg/dL — ABNORMAL LOW (ref 8.9–10.3)
Chloride: 101 mmol/L (ref 98–111)
Creatinine, Ser: 1.77 mg/dL — ABNORMAL HIGH (ref 0.44–1.00)
GFR calc Af Amer: 33 mL/min — ABNORMAL LOW (ref >60)
GFR calc non Af Amer: 29 mL/min — ABNORMAL LOW (ref >60)
Glucose, Bld: 142 mg/dL — ABNORMAL HIGH (ref 70–99)
Potassium: 3.7 mmol/L (ref 3.5–5.1)
Sodium: 143 mmol/L (ref 135–145)

## 2018-12-21 LAB — GLUCOSE, CAPILLARY
Glucose-Capillary: 85 mg/dL (ref 70–99)
Glucose-Capillary: 108 mg/dL — ABNORMAL HIGH (ref 70–99)

## 2018-12-21 MED ORDER — FERROUS SULFATE 325 (65 FE) MG PO TABS: 325.0000 mg | ORAL_TABLET | Freq: Two times a day (BID) | ORAL | 3 refills | Status: DC

## 2018-12-21 MED ORDER — DICLOFENAC SODIUM 1 % TD GEL
2.0000 g | Freq: Four times a day (QID) | TRANSDERMAL | Status: DC
  Administered 2018-12-21: 14:00:00 2 g via TOPICAL
  Filled 2018-12-21: qty 100

## 2018-12-21 MED ORDER — HEPARIN SOD (PORK) LOCK FLUSH 100 UNIT/ML IV SOLN
500.0000 [IU] | Freq: Once | INTRAVENOUS | Status: AC
  Administered 2018-12-21: 14:00:00 500 [IU] via INTRAVENOUS
  Filled 2018-12-21: qty 5

## 2018-12-21 MED ORDER — POLYETHYLENE GLYCOL 3350 17 G PO PACK: 17.0000 g | PACK | Freq: Every day | ORAL | 0 refills | Status: DC | PRN

## 2018-12-21 NOTE — Discharge Instructions (Signed)
Follow with Urology clinic as you already have appointment

## 2018-12-21 NOTE — Discharge Summary (Signed)
[place] at [place] NAME: Angela Russell    MR#:  [id number]  DATE OF BIRTH:  Mar 20, 1951  DATE OF ADMISSION:  12/19/2018 ADMITTING PHYSICIAN: Sela Hua, MD  DATE OF DISCHARGE: 12/21/2018   PRIMARY CARE PHYSICIAN: Volney American, PA-C    ADMISSION DIAGNOSIS:  Hyperkalemia [E87.5]  DISCHARGE DIAGNOSIS:  Active Problems:   Hyperkalemia   SECONDARY DIAGNOSIS:   Past Medical History:  Diagnosis Date  . Carotid artery plaque, right 01/2014  . CKD (chronic kidney disease)    stage 2-3  . Diabetes mellitus without complication ([place])   . DM (diabetes mellitus), type 2, uncontrolled ([place])   . Hyperlipidemia   . Hypertension   . Hypochromic microcytic anemia    mild  . Osteoporosis   . Renal insufficiency   . Rotator cuff tendonitis, right     HOSPITAL COURSE:   Hyperkalemia- possibly due to obstruction (due to hx of bladder cancer), hemolysis, ACE. -s/p calcium gluconate,insulin, Veltassa, sodium bicarb given in the ED -Nephrology consult appreciated.  IV bicarb drip. -Recheck BMP at 1800 -Renal ultrasound -no hydronephrosis. -LDH, bilirubin ordered-not high. -Holding home lisinopril+ HCTz -Cardiac monitoring -Potassium level came down to safe range. - Advised to stay off lisinopril for now,a nd follow in nephro clinic in 1 week.  AKI in CKD III- baseline creatinine seems to be ~1.1-1.5 -Continue IVFs -Avoid nephrotoxic agents -Holding home lisinopril -Renal ultrasound as above -Renal function improving. Creatinine 1.7- advise to follow with nephrologist in office next week.  History of bladder cancer s/p cystectomy with urostomy bag in place. Follows with [place] care -Urology consulted-no acute findings as per him. - Pt have appointment with her  Urologist on 28th July.  Chronic iron deficiency anemia- hemoglobin is close to baseline. -Hemolysis labs as above -Recent  anemia panel unremarkable -Monitor, replace oral iron.  Hypertension- BPs soft in the ED -Hold home BP meds, stable now.  Type 2 diabetes -Sensitive SSI  Hyperlipidemia- stable -Continue home statin  DISCHARGE CONDITIONS:   Stable.  CONSULTS OBTAINED:    DRUG ALLERGIES:  No Known Allergies  DISCHARGE MEDICATIONS:   Allergies as of 12/21/2018   No Known Allergies     Medication List    STOP taking these medications   lisinopril-hydrochlorothiazide 20-12.5 MG tablet Commonly known as: ZESTORETIC   metFORMIN 1000 MG tablet Commonly known as: GLUCOPHAGE   pioglitazone 15 MG tablet Commonly known as: ACTOS     TAKE these medications   amLODipine 5 MG tablet Commonly known as: NORVASC Take 1 tablet (5 mg total) by mouth daily.   diclofenac sodium 1 % Gel Commonly known as: VOLTAREN Apply 2 g topically 4 (four) times daily.   ferrous sulfate 325 (65 FE) MG tablet Take 1 tablet (325 mg total) by mouth 2 (two) times daily with a meal.   polyethylene glycol 17 g packet Commonly known as: MIRALAX / GLYCOLAX Take 17 g by mouth daily as needed for mild constipation.   pravastatin 10 MG tablet Commonly known as: PRAVACHOL Take 1 tablet (10 mg total) by mouth at bedtime.        DISCHARGE INSTRUCTIONS:   Follow with Urology clinic in 2 weeks. Follow with Nephrology clinic in 1 week.  If you experience worsening of your admission symptoms, develop shortness of breath, life threatening emergency, suicidal or homicidal thoughts you must seek medical attention immediately by calling 911 or calling your MD immediately  if  symptoms less severe.  You Must read complete instructions/literature along with all the possible adverse reactions/side effects for all the Medicines you take and that have been prescribed to you. Take any new Medicines after you have completely understood and accept all the possible adverse reactions/side effects.   Please note  You were  cared for by a hospitalist during your hospital stay. If you have any questions about your discharge medications or the care you received while you were in the hospital after you are discharged, you can call the unit and asked to speak with the hospitalist on call if the hospitalist that took care of you is not available. Once you are discharged, your primary care physician will handle any further medical issues. Please note that NO REFILLS for any discharge medications will be authorized once you are discharged, as it is imperative that you return to your primary care physician (or establish a relationship with a primary care physician if you do not have one) for your aftercare needs so that they can reassess your need for medications and monitor your lab values.    Today   CHIEF COMPLAINT:   Chief Complaint  Patient presents with  . Abnormal Lab    HISTORY OF PRESENT ILLNESS:  Angela Russell  is a 68 y.o. female with a known history of bladder cancer s/p cystectomy with urostomy bag, hypertension, type 2 diabetes, CKD 3, hyperlipidemia who was sent to the ED by her PCP due to outpatient labs showing a potassium of 9.1.  Patient states she has not been feeling well for the last week.  She endorses overall muscle weakness and fatigue.  She also endorses some generalized abdominal pain.  She denies any palpitations, chest pain, shortness of breath.  In the ED, she was mildly tachycardic.  Labs were significant for K > 7.5, creatinine 2.33, hemoglobin 8.3.  EKG with marked sinus arrhythmia.  She was given calcium gluconate, NovoLog, Veltassa, sodium bicarb.  Hospitalists were called for admission.    VITAL SIGNS:  Blood pressure 130/70, pulse 99, temperature 98.4 F (36.9 C), temperature source Oral, resp. rate 16, height 5\' 4"  (1.626 m), weight 80.6 kg, SpO2 98 %.  I/O:    Intake/Output Summary (Last 24 hours) at 12/21/2018 1213 Last data filed at 12/21/2018 1100 Gross per 24 hour   Intake 2887.24 ml  Output 1965 ml  Net 922.24 ml    PHYSICAL EXAMINATION:   GENERAL:68 y.o.-year-old patient lying in the bed with no acute distress.Tired-appearing, but non-toxic. EYES: Pupils equal, round, reactive to light and accommodation. No scleral icterus. Extraocular muscles intact.  HEENT: Head atraumatic, normocephalic. Oropharynx and nasopharynx clear.  NECK: Supple, no jugular venous distention. No thyroid enlargement, no tenderness.  LUNGS: Normal breath sounds bilaterally, no wheezing, rales,rhonchi or crepitation. No use of accessory muscles of respiration.  CARDIOVASCULAR:Tachycardic, regular rhythm,S1, S2 normal. No murmurs, rubs, or gallops.  ABDOMEN: Soft, nontender, nondistended. Bowel sounds present. No organomegaly or mass.+urostomy bag in place with small amount of urine present. EXTREMITIES: No pedal edema, cyanosis, or clubbing.  NEUROLOGIC: Cranial nerves II through XII are intact. Muscle strength 5/5 in all extremities. Sensation intact. Gait not checked.  PSYCHIATRIC: The patient is alert and oriented x 3.  SKIN: No obvious rash, lesion, or ulcer.    DATA REVIEW:   CBC Recent Labs  Lab 12/21/18 0852  WBC 5.6  HGB 7.4*  HCT 23.4*  PLT 271    Chemistries  Recent Labs  Lab 12/20/18 0004 12/20/18  8657 12/21/18 0852  NA 138 139 143  K 5.7* 5.0 3.7  CL 112* 111 101  CO2 19* 22 31  GLUCOSE 87 106* 142*  BUN 33* 30* 21  CREATININE 1.80* 1.76* 1.77*  CALCIUM 9.4 9.0 8.8*  MG 1.9  --   --   AST 16 17  --   ALT 9 9  --   ALKPHOS 69 67  --   BILITOT 0.5 0.3  --     Cardiac Enzymes No results for input(s): TROPONINI in the last 168 hours.  Microbiology Results  Results for orders placed or performed during the hospital encounter of 12/19/18  SARS Coronavirus 2 (CEPHEID- Performed in [facility] hospital lab), Hosp Order     Status: None   Collection Time: 12/19/18  2:47 PM   Specimen: Nasopharyngeal Swab  Result Value Ref Range  Status   SARS Coronavirus 2 NEGATIVE NEGATIVE Final    Comment: (NOTE) If result is NEGATIVE SARS-CoV-2 target nucleic acids are NOT DETECTED. The SARS-CoV-2 RNA is generally detectable in upper and lower  respiratory specimens during the acute phase of infection. The lowest  concentration of SARS-CoV-2 viral copies this assay can detect is 250  copies / mL. A negative result does not preclude SARS-CoV-2 infection  and should not be used as the sole basis for treatment or other  patient management decisions.  A negative result may occur with  improper specimen collection / handling, submission of specimen other  than nasopharyngeal swab, presence of viral mutation(s) within the  areas targeted by this assay, and inadequate number of viral copies  (<250 copies / mL). A negative result must be combined with clinical  observations, patient history, and epidemiological information. If result is POSITIVE SARS-CoV-2 target nucleic acids are DETECTED. The SARS-CoV-2 RNA is generally detectable in upper and lower  respiratory specimens dur ing the acute phase of infection.  Positive  results are indicative of active infection with SARS-CoV-2.  Clinical  correlation with patient history and other diagnostic information is  necessary to determine patient infection status.  Positive results do  not rule out bacterial infection or co-infection with other viruses. If result is PRESUMPTIVE POSTIVE SARS-CoV-2 nucleic acids MAY BE PRESENT.   A presumptive positive result was obtained on the submitted specimen  and confirmed on repeat testing.  While 2019 novel coronavirus  (SARS-CoV-2) nucleic acids may be present in the submitted sample  additional confirmatory testing may be necessary for epidemiological  and / or clinical management purposes  to differentiate between  SARS-CoV-2 and other Sarbecovirus currently known to infect humans.  If clinically indicated additional testing with an alternate  test  methodology [phone]) is advised. The SARS-CoV-2 RNA is generally  detectable in upper and lower respiratory sp ecimens during the acute  phase of infection. The expected result is Negative. Fact Sheet for Patients:  StrictlyIdeas.no Fact Sheet for Healthcare Providers: BankingDealers.co.za This test is not yet approved or cleared by the [place] FDA and has been authorized for detection and/or diagnosis of SARS-CoV-2 by FDA under an Emergency Use Authorization (EUA).  This EUA will remain in effect (meaning this test can be used) for the duration of the COVID-19 declaration under Section 564(b)(1) of the Act, 21 U.S.C. section 360bbb-3(b)(1), unless the authorization is terminated or revoked sooner. Performed at [facility], [address] [place], [place] [address]   MRSA PCR Screening     Status: None   Collection Time: 12/19/18  5:39 PM  Specimen: Nasopharyngeal  Result Value Ref Range Status   MRSA by PCR NEGATIVE NEGATIVE Final    Comment:        The GeneXpert MRSA Assay (FDA approved for NASAL specimens only), is one component of a comprehensive MRSA colonization surveillance program. It is not intended to diagnose MRSA infection nor to guide or monitor treatment for MRSA infections. Performed at [facility], [address] [place], [place] [address]     RADIOLOGY:  US Renal  Result Date: 12/19/2018 CLINICAL DATA:  Hyperkalemia. EXAM: RENAL / URINARY TRACT ULTRASOUND COMPLETE COMPARISON:  CT abdomen pelvis 10/13/2018 FINDINGS: Right Kidney: Renal measurements: 9.7 x 3.9 x 4.7 cm = volume: 95 mL . Echogenicity within normal limits. No mass or hydronephrosis visualized. Left Kidney: Renal measurements: 10.0 x 4.5 x 5.6 cm = volume: 134 mL. Echogenicity within normal limits. No mass or hydronephrosis visualized. Bladder: Previous cystectomy. IMPRESSION: Kidneys normal size without  hydronephrosis. Previous cystectomy. Electronically Signed   By: Marin Olp M.D.   On: 12/19/2018 17:30    EKG:   Orders placed or performed during the hospital encounter of 12/19/18  . EKG 12-Lead  . EKG 12-Lead      Management plans discussed with the patient, family and they are in agreement.  CODE STATUS:     Code Status Orders  (From admission, onward)         Start     Ordered   12/19/18 1739  Full code  Continuous     12/19/18 1738        Code Status History    Date Active Date Inactive Code Status Order ID Comments User Context   07/16/2018 1718 07/17/2018 1445 Full Code [id number]  Billey Co, MD Inpatient   Advance Care Planning Activity      TOTAL TIME TAKING CARE OF THIS PATIENT: 35 minutes.    Vaughan Basta M.D on 12/21/2018 at 12:13 PM  Between 7am to 6pm - Pager - [phone]  After 6pm go to [email or web address] - password [email or web address] [place] [facility]  Office  [phone]  CC: Primary care physician; Volney American, PA-C   Note: This dictation was prepared with Dragon dictation along with smaller phrase technology. Any transcriptional errors that result from this process are unintentional.

## 2018-12-21 NOTE — Progress Notes (Signed)
[facility], [place] 12/21/18  Subjective:   Potassium level is now corrected to 3.7 Nonoliguric Serum creatinine has also improved to 1.77 Patient reports lower abdominal pain.  States she has some vaginal prolapse since her surgery.  Also reports left knee pain  Objective:  Vital signs in last 24 hours:  Temp:  [97.9 F (36.6 C)-98.7 F (37.1 C)] 98.4 F (36.9 C) (07/19 0625) Pulse Rate:  [70-101] 99 (07/19 0625) Resp:  [6-20] 16 (07/18 1551) BP: (107-130)/(51-70) 130/70 (07/19 0625) SpO2:  [98 %-100 %] 98 % (07/19 0625)  Weight change:  Filed Weights   12/19/18 1359 12/19/18 1743  Weight: 83 kg 80.6 kg    Intake/Output:    Intake/Output Summary (Last 24 hours) at 12/21/2018 1036 Last data filed at 12/21/2018 0900 Gross per 24 hour  Intake 952.16 ml  Output 1615 ml  Net -662.84 ml    Gen:   Alert, cooperative, no distress, appears stated age Head:   Normocephalic, without obvious abnormality, atraumatic Eyes/ENT:  conjunctiva/corneas clear,  moist oral mucus membranes Neck:  Supple,  thyroid: not enlarged, no JVD Lungs:   Clear to auscultation bilaterally, respirations unlabored Heart:   Regular rate and rhythm, S1, S2 normal, no murmur, rub or gallop Abdomen:   Soft, non-tender, bowel sounds active  Extremities: no cyanosis or edema Skin:  Skin color, texture, turgor normal, no rashes or lesions Neurologic: Alert and oriented, able to answer questions appropriately     Basic Metabolic Panel:  Recent Labs  Lab 12/19/18 1446 12/19/18 1841 12/20/18 0004 12/20/18 0514 12/21/18 0852  NA 136 137 138 139 143  K >7.5* 6.3* 5.7* 5.0 3.7  CL 112* 115* 112* 111 101  CO2 16* 15* 19* 22 31  GLUCOSE 92 129* 87 106* 142*  BUN 40* 37* 33* 30* 21  CREATININE 2.33* 2.03* 1.80* 1.76* 1.77*  CALCIUM 9.7 9.7 9.4 9.0 8.8*  MG  --   --  1.9  --   --   PHOS  --   --  5.4*  --   --      CBC: Recent Labs  Lab 12/19/18 1446 12/20/18 0514  12/21/18 0852  WBC 6.0 5.2 5.6  NEUTROABS 4.3  --   --   HGB 8.3* 7.1* 7.4*  HCT 27.1* 22.7* 23.4*  MCV 81.6 79.4* 78.5*  PLT 346 264 271     No results found for: HEPBSAG, HEPBSAB, HEPBIGM    Microbiology:  Recent Results (from the past 240 hour(s))  SARS Coronavirus 2 (CEPHEID- Performed in [place] hospital lab), Hosp Order     Status: None   Collection Time: 12/19/18  2:47 PM   Specimen: Nasopharyngeal Swab  Result Value Ref Range Status   SARS Coronavirus 2 NEGATIVE NEGATIVE Final    Comment: (NOTE) If result is NEGATIVE SARS-CoV-2 target nucleic acids are NOT DETECTED. The SARS-CoV-2 RNA is generally detectable in upper and lower  respiratory specimens during the acute phase of infection. The lowest  concentration of SARS-CoV-2 viral copies this assay can detect is 250  copies / mL. A negative result does not preclude SARS-CoV-2 infection  and should not be used as the sole basis for treatment or other  patient management decisions.  A negative result may occur with  improper specimen collection / handling, submission of specimen other  than nasopharyngeal swab, presence of viral mutation(s) within the  areas targeted by this assay, and inadequate number of viral copies  (<250 copies /  mL). A negative result must be combined with clinical  observations, patient history, and epidemiological information. If result is POSITIVE SARS-CoV-2 target nucleic acids are DETECTED. The SARS-CoV-2 RNA is generally detectable in upper and lower  respiratory specimens dur ing the acute phase of infection.  Positive  results are indicative of active infection with SARS-CoV-2.  Clinical  correlation with patient history and other diagnostic information is  necessary to determine patient infection status.  Positive results do  not rule out bacterial infection or co-infection with other viruses. If result is PRESUMPTIVE POSTIVE SARS-CoV-2 nucleic acids MAY BE PRESENT.   A  presumptive positive result was obtained on the submitted specimen  and confirmed on repeat testing.  While 2019 novel coronavirus  (SARS-CoV-2) nucleic acids may be present in the submitted sample  additional confirmatory testing may be necessary for epidemiological  and / or clinical management purposes  to differentiate between  SARS-CoV-2 and other Sarbecovirus currently known to infect humans.  If clinically indicated additional testing with an alternate test  methodology [phone]) is advised. The SARS-CoV-2 RNA is generally  detectable in upper and lower respiratory sp ecimens during the acute  phase of infection. The expected result is Negative. Fact Sheet for Patients:  StrictlyIdeas.no Fact Sheet for Healthcare Providers: BankingDealers.co.za This test is not yet approved or cleared by the [place] FDA and has been authorized for detection and/or diagnosis of SARS-CoV-2 by FDA under an Emergency Use Authorization (EUA).  This EUA will remain in effect (meaning this test can be used) for the duration of the COVID-19 declaration under Section 564(b)(1) of the Act, 21 U.S.C. section 360bbb-3(b)(1), unless the authorization is terminated or revoked sooner. Performed at [facility], [address] [place], [place] [address]   MRSA PCR Screening     Status: None   Collection Time: 12/19/18  5:39 PM   Specimen: Nasopharyngeal  Result Value Ref Range Status   MRSA by PCR NEGATIVE NEGATIVE Final    Comment:        The GeneXpert MRSA Assay (FDA approved for NASAL specimens only), is one component of a comprehensive MRSA colonization surveillance program. It is not intended to diagnose MRSA infection nor to guide or monitor treatment for MRSA infections. Performed at [facility], [address] [place], [place] [address]     Coagulation Studies: No results for input(s): LABPROT, INR in the last 72  hours.  Urinalysis: Recent Labs    12/20/18 0220  COLORURINE COLORLESS*  LABSPEC 1.002*  PHURINE 8.0  GLUCOSEU NEGATIVE  HGBUR NEGATIVE  BILIRUBINUR NEGATIVE  KETONESUR NEGATIVE  PROTEINUR NEGATIVE  NITRITE NEGATIVE  LEUKOCYTESUR NEGATIVE      Imaging: US Renal  Result Date: 12/19/2018 CLINICAL DATA:  Hyperkalemia. EXAM: RENAL / URINARY TRACT ULTRASOUND COMPLETE COMPARISON:  CT abdomen pelvis 10/13/2018 FINDINGS: Right Kidney: Renal measurements: 9.7 x 3.9 x 4.7 cm = volume: 95 mL . Echogenicity within normal limits. No mass or hydronephrosis visualized. Left Kidney: Renal measurements: 10.0 x 4.5 x 5.6 cm = volume: 134 mL. Echogenicity within normal limits. No mass or hydronephrosis visualized. Bladder: Previous cystectomy. IMPRESSION: Kidneys normal size without hydronephrosis. Previous cystectomy. Electronically Signed   By: Marin Olp M.D.   On: 12/19/2018 17:30     Medications:   .  sodium bicarbonate  infusion 1000 mL 125 mL/hr at 12/21/18 0736   . Chlorhexidine Gluconate Cloth  6 each Topical Q0600  . ferrous sulfate  325 mg Oral BID WC  .  heparin  5,000 Units Subcutaneous Q8H  . insulin aspart  0-5 Units Subcutaneous QHS  . insulin aspart  0-9 Units Subcutaneous TID WC  . patiromer  25.2 g Oral Daily  . 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   Acute kidney injury Chronic kidney disease stage III-Baseline serum  creatinine of 1.18/55 on 08/11/2018 Acidosis Severe hyperkalemia DM-2 with CKD  renal parameters have improved with IV hydration.  Patient is nonoliguric.   Potassium improved with V[place]  Plan: May discontinue sodium bicarb infusion and Velt[place] Ambulate patient.  Topical relief for left knee pain Continue to hold lisinopril/HCTZ.  Blood pressures well controlled at present Consider restarting lisinopril at a lower dose as outpatient if blood pressure is consistently greater than 130/80.  We will arrange for outpatient follow-up with nephrology also for CKD. Avoid high potassium drinks Lab Results  Component Value Date   HGBA1C 5.9 (H) 12/18/2018        LOS: [place] 7/19/202010:36 AM  [place], [place]  Note: This note was prepared with Dragon dictation. Any transcription errors are unintentional

## 2018-12-21 NOTE — Progress Notes (Signed)
Patient's urine has strong, foul odor. Received verbal order from Dr. Leslye Peer for UA/ culture. Sample sent.

## 2018-12-21 NOTE — Plan of Care (Signed)

## 2018-12-21 NOTE — Progress Notes (Signed)
PT Cancellation Note  Patient Details Name: Angela Russell MRN: [id number] DOB: [birth year]   Cancelled Treatment:    Reason Eval/Treat Not Completed: Other (comment).   Pt is up walking with nursing at supervised level, and feels she is just a bit off from being in bed.  Has agreed to let PT come by and see if she still feels not in need of therapy today, and will ask nursing to have PT return today if needed.   Ramond Dial 12/21/2018, 11:29 AM   Mee Hives, PT MS Acute Rehab Dept. Number: [facility] [phone]

## 2018-12-22 ENCOUNTER — Telehealth: Payer: Self-pay | Admitting: Family Medicine

## 2018-12-22 LAB — GLUCOSE, CAPILLARY: Glucose-Capillary: 56 mg/dL — ABNORMAL LOW (ref 70–99)

## 2018-12-22 NOTE — Telephone Encounter (Signed)
Message relayed to patient. Verbalized understanding and denied questions. Pt scheduled.

## 2018-12-22 NOTE — Telephone Encounter (Signed)
Pt says that she was admitted in the hospital and has questions about her current medications. Pt says that the hospital made a change and she's not sure of how to take her medication now.   Pt would like a call back as soon as possible to discuss.    CB:[phone]

## 2018-12-22 NOTE — Telephone Encounter (Signed)
Looks like they stopped her metformin, actos, and lisinopril HCTZ upon discharge and have gotten her set up with the Nephrologist in about a week. The remainder of regimen stayed the same it appears. We should see her back to discuss diabetes regimen sometimes within the next month as both of her medications were d/c'd and to check in on her BPs. Should see Nephrology first for their input

## 2018-12-23 LAB — URINE CULTURE
Culture: 80000 — AB
Special Requests: NORMAL

## 2018-12-23 LAB — HAPTOGLOBIN: Haptoglobin: 365 mg/dL — ABNORMAL HIGH (ref 37–355)

## 2018-12-23 NOTE — Assessment & Plan Note (Signed)
Followed by [employer] and Urology, continue per their recommendations. Is s/p cystectomy with urostomy in place

## 2018-12-23 NOTE — Assessment & Plan Note (Signed)
Recheck lipids, adjust as needed. Continue current regimen 

## 2018-12-23 NOTE — Assessment & Plan Note (Signed)
Recheck A1C, adjust as needed. Continue current regimen. 

## 2018-12-23 NOTE — Assessment & Plan Note (Signed)
BPs stable and WNL, continue current regimen 

## 2018-12-23 NOTE — Assessment & Plan Note (Signed)
Recheck labs, continue good hydration. Adjust as needed

## 2018-12-29 DIAGNOSIS — N183 Chronic kidney disease, stage 3 (moderate): Secondary | ICD-10-CM | POA: Diagnosis not present

## 2018-12-29 DIAGNOSIS — E875 Hyperkalemia: Secondary | ICD-10-CM | POA: Diagnosis not present

## 2018-12-29 DIAGNOSIS — N179 Acute kidney failure, unspecified: Secondary | ICD-10-CM | POA: Diagnosis not present

## 2018-12-30 DIAGNOSIS — Z906 Acquired absence of other parts of urinary tract: Secondary | ICD-10-CM | POA: Diagnosis not present

## 2018-12-30 DIAGNOSIS — D649 Anemia, unspecified: Secondary | ICD-10-CM | POA: Diagnosis not present

## 2018-12-30 DIAGNOSIS — C674 Malignant neoplasm of posterior wall of bladder: Secondary | ICD-10-CM | POA: Diagnosis not present

## 2018-12-30 DIAGNOSIS — Z9049 Acquired absence of other specified parts of digestive tract: Secondary | ICD-10-CM | POA: Diagnosis not present

## 2018-12-30 DIAGNOSIS — Z936 Other artificial openings of urinary tract status: Secondary | ICD-10-CM | POA: Diagnosis not present

## 2018-12-30 DIAGNOSIS — K5900 Constipation, unspecified: Secondary | ICD-10-CM | POA: Diagnosis not present

## 2018-12-30 DIAGNOSIS — M25562 Pain in left knee: Secondary | ICD-10-CM | POA: Diagnosis not present

## 2018-12-30 DIAGNOSIS — R102 Pelvic and perineal pain: Secondary | ICD-10-CM | POA: Diagnosis not present

## 2018-12-30 DIAGNOSIS — R918 Other nonspecific abnormal finding of lung field: Secondary | ICD-10-CM | POA: Diagnosis not present

## 2019-01-14 ENCOUNTER — Encounter: Payer: Self-pay | Admitting: Family Medicine

## 2019-01-14 ENCOUNTER — Other Ambulatory Visit: Payer: Self-pay

## 2019-01-14 ENCOUNTER — Ambulatory Visit (INDEPENDENT_AMBULATORY_CARE_PROVIDER_SITE_OTHER): Payer: Medicare Other | Admitting: Family Medicine

## 2019-01-14 VITALS — BP 147/70 | HR 100 | Temp 98.7°F | Ht 64.0 in | Wt 180.0 lb

## 2019-01-14 DIAGNOSIS — K5900 Constipation, unspecified: Secondary | ICD-10-CM

## 2019-01-14 DIAGNOSIS — R102 Pelvic and perineal pain: Secondary | ICD-10-CM | POA: Diagnosis not present

## 2019-01-14 LAB — WET PREP FOR TRICH, YEAST, CLUE
Clue Cell Exam: NEGATIVE
Trichomonas Exam: NEGATIVE
Yeast Exam: NEGATIVE

## 2019-01-14 NOTE — Progress Notes (Signed)
BP (!) 147/70 (BP Location: Left Arm, Patient Position: Sitting, Cuff Size: Normal)   Pulse 100   Temp 98.7 F (37.1 C) (Oral)   Ht 5\' 4"  (1.626 m)   Wt 180 lb (81.6 kg)   SpO2 100%   BMI 30.90 kg/m    Subjective:    Patient ID: Angela Russell, female    DOB: [birth year], 68 y.o.   MRN: [id number]  HPI: Angela Russell is a 68 y.o. female  Chief Complaint  Patient presents with  . Diabetes  . Hypertension  . Constipation    Has to take laxative to have BM.  [place] Kitchen Abdominal Pain   Having significant constipation and dark stools since the surgery she had a few months ago to remove her bladder after bladder cancer dx, achy lower abdominal pain intermittently. Taking several different laxatives which is the only way she can have a BM per patient. Last colonscopy was at least 7 years ago. Denies N/V/D, fevers, gross blood in stools, known hx of hemorrhoids or other bowel issues.   Also having vaginal pain and feeling like something is pushing out of her with any amount of strain or certain movements. This has also been just since her cystectomy earlier this year. Denies vaginal discharge, concern for STIs, known hx of any type of pelvic floor prolapse or other issues. Has a urostomy tube in place. Not trying anything OTC for relief.   Relevant past medical, surgical, family and social history reviewed and updated as indicated. Interim medical history since our last visit reviewed. Allergies and medications reviewed and updated.  Review of Systems  Per HPI unless specifically indicated above     Objective:    BP (!) 147/70 (BP Location: Left Arm, Patient Position: Sitting, Cuff Size: Normal)   Pulse 100   Temp 98.7 F (37.1 C) (Oral)   Ht 5\' 4"  (1.626 m)   Wt 180 lb (81.6 kg)   SpO2 100%   BMI 30.90 kg/m   Wt Readings from Last 3 Encounters:  01/14/19 180 lb (81.6 kg)  12/19/18 177 lb 11.1 oz (80.6 kg)  10/13/18 220 lb (99.8 kg)    Physical Exam Vitals signs and  nursing note reviewed.  Constitutional:      Appearance: Normal appearance. She is not ill-appearing.  HENT:     Head: Atraumatic.  Eyes:     Extraocular Movements: Extraocular movements intact.     Conjunctiva/sclera: Conjunctivae normal.  Neck:     Musculoskeletal: Normal range of motion and neck supple.  Cardiovascular:     Rate and Rhythm: Normal rate and regular rhythm.     Heart sounds: Normal heart sounds.  Pulmonary:     Effort: Pulmonary effort is normal.     Breath sounds: Normal breath sounds.  Abdominal:     General: Bowel sounds are normal.     Palpations: Abdomen is soft. There is no mass.     Tenderness: There is no abdominal tenderness. There is no right CVA tenderness, left CVA tenderness or guarding.  Genitourinary:    Vagina: Vaginal discharge present.     Comments: Mild erythema and ttp of vaginal mucosa on speculum exam Musculoskeletal: Normal range of motion.  Skin:    General: Skin is warm and dry.  Neurological:     Mental Status: She is alert and oriented to person, place, and time.  Psychiatric:        Mood and Affect: Mood normal.  Thought Content: Thought content normal.        Judgment: Judgment normal.     Results for orders placed or performed in visit on 01/14/19  WET PREP FOR [place], YEAST, CLUE   Specimen: Vaginal Fluid   VAGINAL FLUI  Result Value Ref Range   Trichomonas Exam Negative Negative   Yeast Exam Negative Negative   Clue Cell Exam Negative Negative      Assessment & Plan:   Problem List Items Addressed This Visit    None    Visit Diagnoses    Constipation, unspecified constipation type    -  Primary   Start miralax and fiber supplement daily, refer to GI for further evaluation and possibly repeat colonoscopy given new significant change since recent surgery   Relevant Orders   Ambulatory referral to Gastroenterology   Vaginal pain       Wet prep neg, no rashes or lesions on exam. will refer to GYN for further  evaluation.    Relevant Orders   WET PREP FOR [place], YEAST, CLUE (Completed)       Follow up plan: Return for as scheduled.

## 2019-01-15 ENCOUNTER — Telehealth: Payer: Self-pay | Admitting: Family Medicine

## 2019-01-15 DIAGNOSIS — R102 Pelvic and perineal pain: Secondary | ICD-10-CM

## 2019-01-15 NOTE — Telephone Encounter (Signed)
Referral placed.

## 2019-01-15 NOTE — Telephone Encounter (Signed)
Patient returned call and was given results as noted by Merrie Roof, PA-C. Patient would like to be referred to a GYN in [place].

## 2019-01-15 NOTE — Telephone Encounter (Signed)
Routing to provider  

## 2019-01-27 ENCOUNTER — Other Ambulatory Visit: Payer: Self-pay

## 2019-01-27 ENCOUNTER — Encounter: Payer: Self-pay | Admitting: Obstetrics & Gynecology

## 2019-01-27 ENCOUNTER — Ambulatory Visit (INDEPENDENT_AMBULATORY_CARE_PROVIDER_SITE_OTHER): Payer: Medicare Other | Admitting: Obstetrics & Gynecology

## 2019-01-27 VITALS — BP 130/80 | Ht 64.0 in | Wt 183.0 lb

## 2019-01-27 DIAGNOSIS — R102 Pelvic and perineal pain: Secondary | ICD-10-CM | POA: Diagnosis not present

## 2019-01-27 DIAGNOSIS — M792 Neuralgia and neuritis, unspecified: Secondary | ICD-10-CM | POA: Diagnosis not present

## 2019-01-27 MED ORDER — GABAPENTIN 100 MG PO CAPS: ORAL_CAPSULE | ORAL | 0 refills | Status: DC

## 2019-01-27 NOTE — Patient Instructions (Signed)

## 2019-01-27 NOTE — Progress Notes (Signed)
Gynecology Pelvic Pain Evaluation  Consultant: Merrie Roof, PA-C Reason- Vag pain  Chief Complaint  Patient presents with  . Vaginal Pain    History of Present Illness:   Patient is a 68 y.o. G1P1001 who LMP was No LMP recorded. Patient has had a hysterectomy., presents today for a problem visit.  She complains of vaginal pain since having surgery for her bladder cancer.   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 The pain radiates to the  Non-radiating. She has these associated symptoms which include none; she has no discharge or bleeding and has had prior hysterectomy for fibroids years ago; she is Not sexually active. Patient has these modifiers which include nothing that make it better and activity as described above that make it worse.  Context includes: post surgery (bladder removed due to cancer; did not tolerate chemo and is on no chemo or radiation).  No HRT.  PMHx: She  has a past medical history of Carotid artery plaque, right (01/2014), CKD (chronic kidney disease), Diabetes mellitus without complication ([place]), DM (diabetes mellitus), type 2, uncontrolled ([place]), Hyperlipidemia, Hypertension, Hypochromic microcytic anemia, Osteoporosis, Renal insufficiency, and Rotator cuff tendonitis, right. Also,  has a past surgical history that includes Knee surgery (Left, 03/17/2013); Abdominal hysterectomy (2000); Transurethral resection of bladder tumor (N/A, 07/16/2018); Cystoscopy w/ retrogrades (Bilateral, 07/16/2018); and PORTA CATH INSERTION (N/A, 08/06/2018)., family history includes Arthritis in her father; Diabetes in her brother; Heart attack in her mother; Heart disease in her mother.,  reports that she quit smoking about 27 years ago. She has never used smokeless tobacco. She reports that she does not drink  alcohol or use drugs.  She has a current medication list which includes the following prescription(s): amlodipine, diclofenac sodium, ferrous sulfate, polyethylene glycol, pravastatin, gabapentin, and prochlorperazine. Also, has No Known Allergies.  Review of Systems  Constitutional: Negative for chills, fever and malaise/fatigue.  HENT: Negative for congestion, sinus pain and sore throat.   Eyes: Negative for blurred vision and pain.  Respiratory: Negative for cough and wheezing.   Cardiovascular: Negative for chest pain and leg swelling.  Gastrointestinal: Negative for abdominal pain, constipation, diarrhea, heartburn, nausea and vomiting.  Genitourinary: Negative for dysuria, frequency, hematuria and urgency.  Musculoskeletal: Negative for back pain, joint pain, myalgias and neck pain.  Skin: Negative for itching and rash.  Neurological: Negative for dizziness, tremors and weakness.  Endo/Heme/Allergies: Does not bruise/bleed easily.  Psychiatric/Behavioral: Negative for depression. The patient is not nervous/anxious and does not have insomnia.     Objective: BP 130/80   Ht 5\' 4"  (1.626 m)   Wt 183 lb (83 kg)   BMI 31.41 kg/m  Physical Exam Constitutional:      General: She is not in acute distress.    Appearance: She is well-developed.  Genitourinary:     Pelvic exam was performed with patient supine.     Vagina normal.     No vaginal erythema or bleeding.     Genitourinary Comments: Cuff intact/ no lesions Scant atrophy Absent uterus and cervix  HENT:     Head: Normocephalic and atraumatic.     Nose: Nose normal.  Abdominal:     General: There is no distension.     Palpations: Abdomen is soft.     Tenderness: There is no abdominal tenderness.  Musculoskeletal: Normal range of motion.  Neurological:     Mental Status: She is alert and oriented to person, place, and time.     Cranial Nerves: No cranial nerve deficit.  Skin:    General: Skin is warm and dry.    Female chaperone present for pelvic portion of the physical exam  Assessment: 68 y.o. G1P1001 with pain likely post op nerve related; also can consider vaginal atrophy as source; this is a new problem for the patient.  Problem List Items Addressed This Visit    Vaginal pain - Primary   Neuropathic pain   Relevant Medications   gabapentin (NEURONTIN) 100 MG capsule Discussed taper up on dose, side effects F/U to adjust further as needed Consider ERT if this does not help, but feel neuropathic pain is more likely the etiology      Barnett Applebaum, MD, [facility], [facility] 01/27/2019  3:22 PM

## 2019-02-02 ENCOUNTER — Telehealth: Payer: Self-pay

## 2019-02-02 NOTE — Telephone Encounter (Signed)
Patient states the medicine you rx'd for her states if you are diabetic or have kidney disease you shouldn't take it. She is diabetic & she has borderline kidney disease. She hasn't taken any medicine yet. She would like to know if she should or should not take this medicine. [phone]

## 2019-02-02 NOTE — Telephone Encounter (Signed)
Yes, hold the medicine until she can talk to her PCP or even neurologist referral to ensure safety

## 2019-02-02 NOTE — Telephone Encounter (Signed)
Patient aware.

## 2019-02-04 ENCOUNTER — Other Ambulatory Visit: Payer: Self-pay

## 2019-02-04 ENCOUNTER — Ambulatory Visit (INDEPENDENT_AMBULATORY_CARE_PROVIDER_SITE_OTHER): Payer: Medicare Other | Admitting: Gastroenterology

## 2019-02-04 ENCOUNTER — Other Ambulatory Visit: Payer: Self-pay | Admitting: Oncology

## 2019-02-04 ENCOUNTER — Telehealth: Payer: Self-pay | Admitting: Oncology

## 2019-02-04 ENCOUNTER — Encounter (INDEPENDENT_AMBULATORY_CARE_PROVIDER_SITE_OTHER): Payer: Self-pay

## 2019-02-04 VITALS — BP 152/79 | HR 92 | Temp 98.2°F | Ht 64.0 in | Wt 184.8 lb

## 2019-02-04 DIAGNOSIS — E538 Deficiency of other specified B group vitamins: Secondary | ICD-10-CM | POA: Diagnosis not present

## 2019-02-04 DIAGNOSIS — D509 Iron deficiency anemia, unspecified: Secondary | ICD-10-CM

## 2019-02-04 DIAGNOSIS — K5900 Constipation, unspecified: Secondary | ICD-10-CM | POA: Diagnosis not present

## 2019-02-04 NOTE — Progress Notes (Signed)
Jonathon Bellows MD, MRCP(U.K) [address]  [place]  [place], [place] [address]  Main: [phone]  Fax: [phone]   Gastroenterology Consultation  Referring Provider:     Volney American,* Primary Care Physician:  Volney American, [place] Primary Gastroenterologist:  Dr. Jonathon Bellows  Reason for Consultation:     Constipation        HPI:   Angela Russell is a 68 y.o. y/o female referred for consultation & management  by Dr. Orene Desanctis, Lilia Argue, PA-C.     She has been referred for constipation.  She has undergone a cystectomy for a bladder mass.  She has been having issues with vaginal pain.  Review of her CBC shows a hemoglobin of 7.4 with an MCV of 78.5.  The anemia is ongoing for greater than 7 months.  She says that since her surgery she has been having difficulty passing the stool and even breaking wind at times.  She says that she does not have a bowel movement unless she takes a Dulcolax.  She takes MiraLAX every day but it does not seem to help.  Never had a colonoscopy before.  She denies any overt blood loss in the form of rectal bleeding or vaginal bleeding or nasal bleeding or hematemesis.  She tried taking iron tablets but she says that it made her constipated.  And hence she stopped.  Denies any NSAID use.  Past Medical History:  Diagnosis Date  . Carotid artery plaque, right 01/2014  . CKD (chronic kidney disease)    stage 2-3  . Diabetes mellitus without complication ([place])   . DM (diabetes mellitus), type 2, uncontrolled ([place])   . Hyperlipidemia   . Hypertension   . Hypochromic microcytic anemia    mild  . Osteoporosis   . Renal insufficiency   . Rotator cuff tendonitis, right     Past Surgical History:  Procedure Laterality Date  . ABDOMINAL HYSTERECTOMY  2000   due to bleeding and fibroids, partial- still has ovaries  . CYSTOSCOPY W/ RETROGRADES Bilateral 07/16/2018   Procedure: CYSTOSCOPY WITH RETROGRADE PYELOGRAM;  Surgeon:  Billey Co, MD;  Location: [facility];  Service: Urology;  Laterality: Bilateral;  . KNEE SURGERY Left 03/17/2013   torn meniscus  . PORTA CATH INSERTION N/A 08/06/2018   Procedure: PORTA CATH INSERTION;  Surgeon: Algernon Huxley, MD;  Location: [facility];  Service: Cardiovascular;  Laterality: N/A;  . TRANSURETHRAL RESECTION OF BLADDER TUMOR N/A 07/16/2018   Procedure: TRANSURETHRAL RESECTION OF BLADDER TUMOR (TURBT);  Surgeon: Billey Co, MD;  Location: [facility];  Service: Urology;  Laterality: N/A;    Prior to Admission medications   Medication Sig Start Date End Date Taking? Authorizing Provider  amLODipine (NORVASC) 5 MG tablet Take 1 tablet (5 mg total) by mouth daily. 12/18/18   Volney American, PA-C  diclofenac sodium (VOLTAREN) 1 % GEL Apply 2 g topically 4 (four) times daily. 12/18/18   Volney American, PA-C  ferrous sulfate 325 (65 FE) MG tablet Take 1 tablet (325 mg total) by mouth 2 (two) times daily with a meal. 12/21/18   Vaughan Basta, MD  gabapentin (NEURONTIN) 100 MG capsule 1 tablet po once daily on day 1, 1 tablet po bid day 2, then 1 tablet po tid 01/27/19   Gae Dry, MD  polyethylene glycol (MIRALAX / GLYCOLAX) 17 g packet Take 17 g by mouth daily as needed for mild constipation. 12/21/18  Vaughan Basta, MD  pravastatin (PRAVACHOL) 10 MG tablet Take 1 tablet (10 mg total) by mouth at bedtime. 12/18/18   Volney American, PA-C  prochlorperazine (COMPAZINE) 10 MG tablet Take 1 tablet (10 mg total) by mouth every 6 (six) hours as needed (Nausea or vomiting). 08/03/18 08/26/18  [name], MD    Family History  Problem Relation Age of Onset  . Heart disease Mother   . Heart attack Mother   . Arthritis Father   . Diabetes Brother      Social History   Tobacco Use  . Smoking status: Former Smoker    Quit date: 06/05/1991    Years since quitting: 27.6  . Smokeless tobacco: Never Used  Substance Use Topics  . Alcohol  use: No  . Drug use: No    Allergies as of 02/04/2019  . (No Known Allergies)    Review of Systems:    All systems reviewed and negative except where noted in HPI.   Physical Exam:  There were no vitals taken for this visit. No LMP recorded. Patient has had a hysterectomy. Psych:  Alert and cooperative. Normal mood and affect. General:   Alert,  Well-developed, well-nourished, pleasant and cooperative in NAD Head:  Normocephalic and atraumatic. Eyes:  Sclera clear, no icterus.   Conjunctiva pink. Ears:  Normal auditory acuity. Nose:  No deformity, discharge, or lesions. Mouth:  No deformity or lesions,oropharynx pink & moist. Neck:  Supple; no masses or thyromegaly. Lungs:  Respirations even and unlabored.  Clear throughout to auscultation.   No wheezes, crackles, or rhonchi. No acute distress. Heart:  Regular rate and rhythm; no murmurs, clicks, rubs, or gallops. Abdomen:  Normal bowel sounds.  No bruits.  Soft, non-tender and non-distended without masses, hepatosplenomegaly or hernias noted.  No guarding or rebound tenderness.    Neurologic:  Alert and oriented x3;  grossly normal neurologically. Skin:  Intact without significant lesions or rashes. No jaundice. Lymph Nodes:  No significant cervical adenopathy. Psych:  Alert and cooperative. Normal mood and affect.  Imaging Studies: No results found.  Assessment and Plan:   Angela Russell is a 68 y.o. y/o female with a history of a bladder mass that underwent resection has been referred to me for constipation and change in bowel movements.  I also note that for more than 7 months she has a microcytic anemia.  The iron levels checked a few months back were borderline low at 33 ferritin.  She has not had any evaluation of the same.  Plan 1.  EGD plus colonoscopy to evaluate for iron deficiency anemia. 2.  Refer to Dr. Tasia Catchings for IV iron.  She cannot tolerate oral iron due to constipation.   3.  Check B12 and folate levels, urine  analysis, celiac serology, H. pylori breath test. 4.  Commence on Linzess 145 mcg daily samples are provided.  If no better she has been advised to call back in a week's time to increase the dose to 290 mcg.  I have discussed alternative options, risks & benefits,  which include, but are not limited to, bleeding, infection, perforation,respiratory complication & drug reaction.  The patient agrees with this plan & written consent will be obtained.    Follow up in 6 weeks   Dr Jonathon Bellows MD,MRCP(U.K)

## 2019-02-04 NOTE — Telephone Encounter (Signed)
Lms for PT to call back to schedule appts per Dr. Tasia Catchings 02/04/2019

## 2019-02-06 ENCOUNTER — Other Ambulatory Visit: Payer: Self-pay | Admitting: Oncology

## 2019-02-06 ENCOUNTER — Telehealth: Payer: Self-pay

## 2019-02-06 ENCOUNTER — Telehealth: Payer: Self-pay | Admitting: Oncology

## 2019-02-06 DIAGNOSIS — C679 Malignant neoplasm of bladder, unspecified: Secondary | ICD-10-CM

## 2019-02-06 DIAGNOSIS — D500 Iron deficiency anemia secondary to blood loss (chronic): Secondary | ICD-10-CM

## 2019-02-06 LAB — URINALYSIS
Bilirubin, UA: NEGATIVE
Ketones, UA: NEGATIVE
Nitrite, UA: NEGATIVE
RBC, UA: NEGATIVE
Specific Gravity, UA: 1.015 (ref 1.005–1.030)
Urobilinogen, Ur: 0.2 mg/dL (ref 0.2–1.0)
pH, UA: >=9 — AB (ref 5.0–7.5)

## 2019-02-06 LAB — H. PYLORI BREATH TEST: H pylori Breath Test: POSITIVE — AB

## 2019-02-06 LAB — B12 AND FOLATE PANEL
Folate: 10.4 ng/mL (ref >3.0)
Vitamin B-12: 310 pg/mL (ref 232–1245)

## 2019-02-06 LAB — IRON,TIBC AND FERRITIN PANEL
Ferritin: 409 ng/mL — ABNORMAL HIGH (ref 15–150)
Iron Saturation: 10 % — ABNORMAL LOW (ref 15–55)
Iron: 26 ug/dL — ABNORMAL LOW (ref 27–139)
Total Iron Binding Capacity: 251 ug/dL (ref 250–450)
UIBC: 225 ug/dL (ref 118–369)

## 2019-02-06 LAB — CELIAC DISEASE AB SCREEN W/RFX
Antigliadin Abs, IgA: 5 units (ref 0–19)
IgA/Immunoglobulin A, Serum: 187 mg/dL (ref 87–352)
Transglutaminase IgA: <2 U/mL (ref 0–3)

## 2019-02-06 NOTE — Progress Notes (Signed)
See telephone note from today.

## 2019-02-06 NOTE — Telephone Encounter (Signed)
Left appt details for pt scheduled for 9/15 @ 1:45. Ask pt to return call to confirm appts.

## 2019-02-06 NOTE — Telephone Encounter (Signed)
-----   Message from [name], MD sent at 02/06/2019 11:59 AM EDT ----- 1-2 weeks thanks.  ----- Message ----- From: Vanice Sarah, CMA Sent: 02/06/2019  10:28 AM EDT To: [name], MD  Is there a certain time frame you would like to see her?   ----- Message ----- From: [name], MD Sent: 02/06/2019   8:30 AM EDT To: Vanice Sarah, CMA   ----- Message ----- From: [name], MD Sent: 02/06/2019  12:04 AM EDT To: Vernetta Honey, CMA  Please schedule patient to see me lab cbc cmp retic panel  +/- Venofer. Thank you.

## 2019-02-06 NOTE — Telephone Encounter (Addendum)
Left message for patient to call the office to schedule lab/MD/possible Venofer.  Also sent a scheduling pool message.

## 2019-02-08 ENCOUNTER — Emergency Department
Admission: EM | Admit: 2019-02-08 | Discharge: 2019-02-08 | Disposition: A | Discharge status: Home / Self Care | Payer: Medicare Other | Attending: Emergency Medicine | Admitting: Emergency Medicine

## 2019-02-08 ENCOUNTER — Emergency Department: Payer: Medicare Other

## 2019-02-08 ENCOUNTER — Other Ambulatory Visit: Payer: Self-pay

## 2019-02-08 DIAGNOSIS — Z79899 Other long term (current) drug therapy: Secondary | ICD-10-CM | POA: Insufficient documentation

## 2019-02-08 DIAGNOSIS — M25562 Pain in left knee: Secondary | ICD-10-CM

## 2019-02-08 DIAGNOSIS — M1990 Unspecified osteoarthritis, unspecified site: Secondary | ICD-10-CM | POA: Diagnosis not present

## 2019-02-08 DIAGNOSIS — M1712 Unilateral primary osteoarthritis, left knee: Secondary | ICD-10-CM | POA: Diagnosis not present

## 2019-02-08 DIAGNOSIS — N183 Chronic kidney disease, stage 3 (moderate): Secondary | ICD-10-CM | POA: Diagnosis not present

## 2019-02-08 DIAGNOSIS — Z791 Long term (current) use of non-steroidal anti-inflammatories (NSAID): Secondary | ICD-10-CM | POA: Diagnosis not present

## 2019-02-08 DIAGNOSIS — R52 Pain, unspecified: Secondary | ICD-10-CM | POA: Diagnosis not present

## 2019-02-08 DIAGNOSIS — I129 Hypertensive chronic kidney disease with stage 1 through stage 4 chronic kidney disease, or unspecified chronic kidney disease: Secondary | ICD-10-CM | POA: Insufficient documentation

## 2019-02-08 DIAGNOSIS — Z87891 Personal history of nicotine dependence: Secondary | ICD-10-CM | POA: Insufficient documentation

## 2019-02-08 DIAGNOSIS — R069 Unspecified abnormalities of breathing: Secondary | ICD-10-CM | POA: Diagnosis not present

## 2019-02-08 DIAGNOSIS — E1122 Type 2 diabetes mellitus with diabetic chronic kidney disease: Secondary | ICD-10-CM | POA: Diagnosis not present

## 2019-02-08 MED ORDER — HYDROCODONE-ACETAMINOPHEN 5-325 MG PO TABS: 1.0000 | ORAL_TABLET | Freq: Four times a day (QID) | ORAL | 0 refills | Status: DC | PRN

## 2019-02-08 MED ORDER — HYDROCODONE-ACETAMINOPHEN 5-325 MG PO TABS
1.0000 | ORAL_TABLET | Freq: Once | ORAL | Status: AC
  Administered 2019-02-08: 02:00:00 1 via ORAL
  Filled 2019-02-08: qty 1

## 2019-02-08 NOTE — ED Triage Notes (Signed)
Pt arrived via [facility] from home with left knee pain. Pt states that this has happened before where her vision goes away when the pain occurs. As soon as the pain goes away, her vision comes back. Pt was also hospitalized 3 weeks ago for elevated potassium and went to her follow up and everything was alright.

## 2019-02-08 NOTE — Discharge Instructions (Addendum)
You may take Norco as needed for pain. Avoid Ibuprofen and Aspirin products due to your stomach issues. Use walker to help you balance as you walk. You may remove ace wrap to bathe and sleep. Return to the ER for worsening symptoms, persistent vomiting, difficulty breathing or other concerns.

## 2019-02-08 NOTE — ED Notes (Signed)
Pt left knee and right ankle wrapped with no issue. Pt wheeled to lobby in wheelchair.

## 2019-02-08 NOTE — ED Provider Notes (Signed)
[facility] Emergency Department Provider Note   ____________________________________________   First MD Initiated Contact with Patient 02/08/19 0202     (approximate)  I have reviewed the triage vital signs and the nursing notes.   HISTORY  Chief Complaint Knee Pain    HPI Angela Russell is a 68 y.o. female who presents to the ED via EMS from home with a chief complaint of nontraumatic left knee pain. Patient reports on/off left knee pain for quite some time. States the pain "takes my vision away", and once the pain subsides, her vision returns. This symptom in not new or different. Denies fall/injury/trauma. Reports pain to medial and lateral side of left knee and pain on standing. Denies fever, cough, chest pain, SOB, abdominal pain, nausea or vomiting. Denies recent travel, trauma or hormone use. Has an upcoming GI procedure to evaluate anemia and +H Pylori.       Past Medical History:  Diagnosis Date  . Carotid artery plaque, right 01/2014  . CKD (chronic kidney disease)    stage 2-3  . Diabetes mellitus without complication ([place])   . DM (diabetes mellitus), type 2, uncontrolled ([place])   . Hyperlipidemia   . Hypertension   . Hypochromic microcytic anemia    mild  . Osteoporosis   . Renal insufficiency   . Rotator cuff tendonitis, right     Patient Active Problem List   Diagnosis Date Noted  . Vaginal pain 01/27/2019  . Neuropathic pain 01/27/2019  . Hyperkalemia 12/19/2018  . Fluid collection at surgical site 10/17/2018  . Tachycardia 10/15/2018  . Pyelonephritis 10/14/2018  . Iron deficiency anemia due to chronic blood loss 08/26/2018  . AKI (acute kidney injury) ([place]) 08/06/2018  . Bladder carcinoma ([place]) 08/03/2018  . Goals of care, counseling/discussion 08/03/2018  . Bladder tumor 07/16/2018  . Hyperlipidemia   . Hypertension associated with diabetes ([place])   . Controlled diabetes mellitus type 2 with complications ([place])   .  Renal insufficiency   . CKD (chronic kidney disease)   . Hypochromic microcytic anemia   . Chronic anemia 07/24/2016  . Carotid artery plaque, right 01/02/2014  . Cervical facet joint syndrome 03/20/2013  . Spondylolisthesis of lumbar region 03/20/2013    Past Surgical History:  Procedure Laterality Date  . ABDOMINAL HYSTERECTOMY  2000   due to bleeding and fibroids, partial- still has ovaries  . CYSTOSCOPY W/ RETROGRADES Bilateral 07/16/2018   Procedure: CYSTOSCOPY WITH RETROGRADE PYELOGRAM;  Surgeon: Billey Co, MD;  Location: [facility];  Service: Urology;  Laterality: Bilateral;  . KNEE SURGERY Left 03/17/2013   torn meniscus  . PORTA CATH INSERTION N/A 08/06/2018   Procedure: PORTA CATH INSERTION;  Surgeon: Algernon Huxley, MD;  Location: [facility];  Service: Cardiovascular;  Laterality: N/A;  . TRANSURETHRAL RESECTION OF BLADDER TUMOR N/A 07/16/2018   Procedure: TRANSURETHRAL RESECTION OF BLADDER TUMOR (TURBT);  Surgeon: Billey Co, MD;  Location: [facility];  Service: Urology;  Laterality: N/A;    Prior to Admission medications   Medication Sig Start Date End Date Taking? Authorizing Provider  amLODipine (NORVASC) 5 MG tablet Take 1 tablet (5 mg total) by mouth daily. 12/18/18   Volney American, PA-C  diclofenac sodium (VOLTAREN) 1 % GEL Apply 2 g topically 4 (four) times daily. 12/18/18   Volney American, PA-C  ferrous sulfate 325 (65 FE) MG tablet Take 1 tablet (325 mg total) by mouth 2 (two) times daily with a meal. 12/21/18  Vaughan Basta, MD  gabapentin (NEURONTIN) 100 MG capsule 1 tablet po once daily on day 1, 1 tablet po bid day 2, then 1 tablet po tid Patient not taking: Reported on 02/04/2019 01/27/19   Gae Dry, MD  polyethylene glycol (MIRALAX / GLYCOLAX) 17 g packet Take 17 g by mouth daily as needed for mild constipation. 12/21/18   Vaughan Basta, MD  pravastatin (PRAVACHOL) 10 MG tablet Take 1 tablet (10 mg total) by  mouth at bedtime. 12/18/18   Volney American, PA-C  prochlorperazine (COMPAZINE) 10 MG tablet Take 1 tablet (10 mg total) by mouth every 6 (six) hours as needed (Nausea or vomiting). 08/03/18 08/26/18  [name], MD    Allergies Patient has no known allergies.  Family History  Problem Relation Age of Onset  . Heart disease Mother   . Heart attack Mother   . Arthritis Father   . Diabetes Brother     Social History Social History   Tobacco Use  . Smoking status: Former Smoker    Quit date: 06/05/1991    Years since quitting: 27.6  . Smokeless tobacco: Never Used  Substance Use Topics  . Alcohol use: No  . Drug use: No    Review of Systems  Constitutional: No fever/chills Eyes: No visual changes. ENT: No sore throat. Cardiovascular: Denies chest pain. Respiratory: Denies shortness of breath. Gastrointestinal: No abdominal pain.  No nausea, no vomiting.  No diarrhea.  No constipation. Genitourinary: Negative for dysuria. Musculoskeletal: Positive for left knee pain. Negative for back pain. Skin: Negative for rash. Neurological: Negative for headaches, focal weakness or numbness.   ____________________________________________   PHYSICAL EXAM:  VITAL SIGNS: ED Triage Vitals  Enc Vitals Group     BP 02/08/19 0059 138/63     Pulse Rate 02/08/19 0059 83     Resp 02/08/19 0059 18     Temp 02/08/19 0059 (!) 97.3 F (36.3 C)     Temp Source 02/08/19 0059 Oral     SpO2 02/08/19 0059 100 %     Weight 02/08/19 0100 183 lb (83 kg)     Height 02/08/19 0100 5\' 4"  (1.626 m)     Head Circumference --      Peak Flow --      Pain Score 02/08/19 0059 6     Pain Loc --      Pain Edu? --      Excl. in [place]? --     Constitutional: Alert and oriented. Well appearing and in no acute distress. Eyes: Conjunctivae are normal. PERRL. EOMI. Head: Atraumatic. Nose: No congestion/rhinnorhea. Mouth/Throat: Mucous membranes are moist.  Oropharynx non-erythematous. Neck: No stridor.    Cardiovascular: Normal rate, regular rhythm. Grossly normal heart sounds.  Good peripheral circulation. Respiratory: Normal respiratory effort.  No retractions. Lungs CTAB. Gastrointestinal: Soft and nontender. No distention. No abdominal bruits. No CVA tenderness. Musculoskeletal: Left knee tender to palpation anterior and lateral aspects. FROM with mild pain. No calf swelling or tenderness. Nopedal edema.  2+ distal pulses. No joint effusions. Neurologic:  Normal speech and language. No gross focal neurologic deficits are appreciated.  Skin:  Skin is warm, dry and intact. No rash noted. Psychiatric: Mood and affect are normal. Speech and behavior are normal.  ____________________________________________   LABS (all labs ordered are listed, but only abnormal results are displayed)  Labs Reviewed - No data to display ____________________________________________  EKG  None ____________________________________________  RADIOLOGY  ED MD interpretation:  Degenerative changes  Official  radiology report(s): Dg Knee Complete 4 Views Left  Result Date: 02/08/2019 CLINICAL DATA:  Knee pain EXAM: LEFT KNEE - COMPLETE 4+ VIEW COMPARISON:  09/02/2012 FINDINGS: Moderate degenerative joint disease changes, most pronounced in the medial and patellofemoral compartments with joint space narrowing and spurring. No acute bony abnormality. Specifically, no fracture, subluxation, or dislocation. No joint effusion. IMPRESSION: Moderate degenerative changes.  No acute bony abnormality. Electronically Signed   By: Rolm Baptise M.D.   On: 02/08/2019 01:37    ____________________________________________   PROCEDURES  Procedure(s) performed (including Critical Care):  Procedures   ____________________________________________   INITIAL IMPRESSION / ASSESSMENT AND PLAN / ED COURSE  As part of my medical decision making, I reviewed the following data within the [facility]  notes reviewed and incorporated, Old chart reviewed, Radiograph reviewed and Notes from prior ED visits     CLEATUS GOODIN was evaluated in Emergency Department on 02/08/2019 for the symptoms described in the history of present illness. She was evaluated in the context of the global COVID-19 pandemic, which necessitated consideration that the patient might be at risk for infection with the SARS-CoV-2 virus that causes COVID-19. Institutional protocols and algorithms that pertain to the evaluation of patients at risk for COVID-19 are in a state of rapid change based on information released by regulatory bodies including the CDC and federal and state organizations. These policies and algorithms were followed during the patient's care in the ED.    68 year old female who presents with nontraumatic left knee pain. Differential diagnosis includes but is not limited to msk strain, gout, septic knee, osteoarthritis, etc.  Xrays negative. Knee is not warmth, swollen or erythematous to suggest septic joint. Will place on Norco, ace wrap, provide walker and follow up with orthopedics. Avoid NSAIDs as patient as anemia being worked up by GI. States she was hospitalized 3 weeks ago at OSH for hyperkalemia and follow up has been fine. Strict return precautions given. Patient verbalizes understanding and agrees with plan of care.      ____________________________________________   FINAL CLINICAL IMPRESSION(S) / ED DIAGNOSES  Final diagnoses:  Acute pain of left knee  Osteoarthritis of left knee, unspecified osteoarthritis type     ED Discharge Orders    None       Note:  This document was prepared using Dragon voice recognition software and may include unintentional dictation errors.   Paulette Blanch, MD 02/08/19 [phone]

## 2019-02-09 DIAGNOSIS — R1084 Generalized abdominal pain: Secondary | ICD-10-CM | POA: Diagnosis not present

## 2019-02-09 DIAGNOSIS — R1900 Intra-abdominal and pelvic swelling, mass and lump, unspecified site: Secondary | ICD-10-CM | POA: Diagnosis not present

## 2019-02-09 DIAGNOSIS — C688 Malignant neoplasm of overlapping sites of urinary organs: Secondary | ICD-10-CM | POA: Diagnosis not present

## 2019-02-09 DIAGNOSIS — N1330 Unspecified hydronephrosis: Secondary | ICD-10-CM | POA: Diagnosis not present

## 2019-02-09 DIAGNOSIS — R590 Localized enlarged lymph nodes: Secondary | ICD-10-CM | POA: Diagnosis not present

## 2019-02-09 DIAGNOSIS — I10 Essential (primary) hypertension: Secondary | ICD-10-CM | POA: Diagnosis present

## 2019-02-09 DIAGNOSIS — Z906 Acquired absence of other parts of urinary tract: Secondary | ICD-10-CM | POA: Diagnosis not present

## 2019-02-09 DIAGNOSIS — G8929 Other chronic pain: Secondary | ICD-10-CM | POA: Diagnosis present

## 2019-02-09 DIAGNOSIS — C679 Malignant neoplasm of bladder, unspecified: Secondary | ICD-10-CM | POA: Diagnosis not present

## 2019-02-09 DIAGNOSIS — R1909 Other intra-abdominal and pelvic swelling, mass and lump: Secondary | ICD-10-CM | POA: Diagnosis present

## 2019-02-09 DIAGNOSIS — Z8551 Personal history of malignant neoplasm of bladder: Secondary | ICD-10-CM | POA: Diagnosis not present

## 2019-02-09 DIAGNOSIS — Z90710 Acquired absence of both cervix and uterus: Secondary | ICD-10-CM | POA: Diagnosis not present

## 2019-02-09 DIAGNOSIS — F40240 Claustrophobia: Secondary | ICD-10-CM | POA: Diagnosis present

## 2019-02-09 DIAGNOSIS — K5669 Other partial intestinal obstruction: Secondary | ICD-10-CM | POA: Diagnosis present

## 2019-02-09 DIAGNOSIS — R188 Other ascites: Secondary | ICD-10-CM | POA: Diagnosis not present

## 2019-02-09 DIAGNOSIS — K56609 Unspecified intestinal obstruction, unspecified as to partial versus complete obstruction: Secondary | ICD-10-CM | POA: Diagnosis not present

## 2019-02-09 DIAGNOSIS — T888XXA Other specified complications of surgical and medical care, not elsewhere classified, initial encounter: Secondary | ICD-10-CM | POA: Diagnosis not present

## 2019-02-09 DIAGNOSIS — Z20828 Contact with and (suspected) exposure to other viral communicable diseases: Secondary | ICD-10-CM | POA: Diagnosis present

## 2019-02-09 DIAGNOSIS — D649 Anemia, unspecified: Secondary | ICD-10-CM | POA: Diagnosis present

## 2019-02-09 DIAGNOSIS — E1122 Type 2 diabetes mellitus with diabetic chronic kidney disease: Secondary | ICD-10-CM | POA: Diagnosis present

## 2019-02-10 ENCOUNTER — Telehealth: Payer: Self-pay

## 2019-02-10 DIAGNOSIS — R188 Other ascites: Secondary | ICD-10-CM | POA: Diagnosis not present

## 2019-02-10 DIAGNOSIS — T888XXA Other specified complications of surgical and medical care, not elsewhere classified, initial encounter: Secondary | ICD-10-CM | POA: Diagnosis not present

## 2019-02-10 DIAGNOSIS — R1084 Generalized abdominal pain: Secondary | ICD-10-CM | POA: Diagnosis not present

## 2019-02-10 DIAGNOSIS — K56609 Unspecified intestinal obstruction, unspecified as to partial versus complete obstruction: Secondary | ICD-10-CM | POA: Diagnosis not present

## 2019-02-10 DIAGNOSIS — R590 Localized enlarged lymph nodes: Secondary | ICD-10-CM | POA: Diagnosis not present

## 2019-02-10 DIAGNOSIS — C679 Malignant neoplasm of bladder, unspecified: Secondary | ICD-10-CM | POA: Diagnosis not present

## 2019-02-10 MED ORDER — LOVENOX 150 MG/ML ~~LOC~~ SOLN
0.50 | SUBCUTANEOUS | Status: DC
Start: ? — End: 2019-02-10

## 2019-02-10 MED ORDER — PRIMAQUINE PHOSPHATE POWD
1.00 | Status: DC
Start: ? — End: 2019-02-10

## 2019-02-10 MED ORDER — BENICAR 20 MG PO TABS
12.50 | ORAL_TABLET | ORAL | Status: DC
Start: ? — End: 2019-02-10

## 2019-02-10 MED ORDER — ASPIRIN-CAFFEINE PO
5.00 | ORAL | Status: DC
Start: ? — End: 2019-02-10

## 2019-02-10 MED ORDER — Medication
30.00 | Status: DC
Start: 2019-02-10 — End: 2019-02-10

## 2019-02-10 MED ORDER — Medication
1000.00 | Status: DC
Start: 2019-02-10 — End: 2019-02-10

## 2019-02-10 MED ORDER — BROMHIST-NR PO
0.00 | ORAL | Status: DC
Start: 2019-02-17 — End: 2019-02-10

## 2019-02-10 MED ORDER — EQUATE NICOTINE 4 MG MT GUM
4.00 | CHEWING_GUM | OROMUCOSAL | Status: DC
Start: ? — End: 2019-02-10

## 2019-02-10 MED ORDER — CVS SORE THROAT SPRAY MT
OROMUCOSAL | Status: DC
Start: ? — End: 2019-02-10

## 2019-02-10 NOTE — Telephone Encounter (Signed)
Called pt to inform her of lab results and Dr. Georgeann Oppenheim recommendations.  Unable to contact, LVM to return call

## 2019-02-10 NOTE — Telephone Encounter (Signed)
-----   Message from Jonathon Bellows, MD sent at 02/05/2019  8:26 PM EDT ----- H pylori positive .   Suggest clarithromycin 500 mg PO BID, amoxicillin 1 gram TID, omeprazole 20 mg BID all for 14 days.  , check for penicillin allergy and will need repeat H pylori stool antigen to check for eradication after .

## 2019-02-12 DIAGNOSIS — R1900 Intra-abdominal and pelvic swelling, mass and lump, unspecified site: Secondary | ICD-10-CM | POA: Diagnosis not present

## 2019-02-12 DIAGNOSIS — C679 Malignant neoplasm of bladder, unspecified: Secondary | ICD-10-CM | POA: Diagnosis not present

## 2019-02-12 DIAGNOSIS — R1084 Generalized abdominal pain: Secondary | ICD-10-CM | POA: Diagnosis not present

## 2019-02-13 DIAGNOSIS — R1084 Generalized abdominal pain: Secondary | ICD-10-CM | POA: Diagnosis not present

## 2019-02-15 DIAGNOSIS — R1084 Generalized abdominal pain: Secondary | ICD-10-CM | POA: Diagnosis not present

## 2019-02-15 DIAGNOSIS — C679 Malignant neoplasm of bladder, unspecified: Secondary | ICD-10-CM | POA: Diagnosis not present

## 2019-02-15 DIAGNOSIS — T888XXA Other specified complications of surgical and medical care, not elsewhere classified, initial encounter: Secondary | ICD-10-CM | POA: Diagnosis not present

## 2019-02-16 DIAGNOSIS — R1084 Generalized abdominal pain: Secondary | ICD-10-CM | POA: Diagnosis not present

## 2019-02-16 NOTE — Telephone Encounter (Signed)
Patient called to return call & let us know she is a patient at [facility].

## 2019-02-16 NOTE — Telephone Encounter (Signed)
-----   Message from Jonathon Bellows, MD sent at 02/05/2019 12:19 PM EDT ----- Angela Russell   Please inform patient that her iron studies are not terribly low.  She may have a degree of iron deficiency but I cannot explain the anemia and the microcytosis with the present iron studies.  B12 levels also are not low.  Since I cannot explain the microcytic anemia by the above results I would suggest we refer to Dr. Tasia Catchings who may want to consider a trial of IV iron to see if the hemoglobin improves and if not may need to consider alternative diagnosis such as hemoglobinopathies or bone marrow issues.  CC Volney American, [place] and Dr Tasia Catchings

## 2019-02-16 NOTE — Telephone Encounter (Signed)
Called pt again to inform her of lab results and Dr. Georgeann Oppenheim recommendations.  Unable to contact, LVM to return call Will send letter.

## 2019-02-17 ENCOUNTER — Other Ambulatory Visit: Admission: RE | Admit: 2019-02-17 | Payer: Medicare Other | Source: Ambulatory Visit

## 2019-02-17 ENCOUNTER — Inpatient Hospital Stay: Payer: Medicare Other | Admitting: Oncology

## 2019-02-17 ENCOUNTER — Other Ambulatory Visit: Payer: Self-pay

## 2019-02-17 ENCOUNTER — Inpatient Hospital Stay: Payer: Medicare Other

## 2019-02-17 DIAGNOSIS — K2970 Gastritis, unspecified, without bleeding: Secondary | ICD-10-CM

## 2019-02-17 DIAGNOSIS — B9681 Helicobacter pylori [H. pylori] as the cause of diseases classified elsewhere: Secondary | ICD-10-CM

## 2019-02-17 MED ORDER — PEG 3350-KCL-NA BICARB-NACL 420 G PO SOLR: 4000.0000 mL | Freq: Once | ORAL | 0 refills | Status: AC

## 2019-02-17 MED ORDER — PHENYLEPHRINE-GUAIFENESIN 30-400 MG PO CP12
5.00 | ORAL_CAPSULE | ORAL | Status: DC
Start: 2019-02-18 — End: 2019-02-17

## 2019-02-17 MED ORDER — VICON FORTE PO CAPS
17.00 | ORAL_CAPSULE | ORAL | Status: DC
Start: 2019-02-18 — End: 2019-02-17

## 2019-02-17 MED ORDER — ASPIRIN-CAFFEINE PO
5.00 | ORAL | Status: DC
Start: ? — End: 2019-02-17

## 2019-02-17 MED ORDER — Medication
5.00 | Status: DC
Start: ? — End: 2019-02-17

## 2019-02-17 MED ORDER — IBUPROFEN 600 MG PO TABS
600.00 | ORAL_TABLET | ORAL | Status: DC
Start: ? — End: 2019-02-17

## 2019-02-17 MED ORDER — AMOXICILLIN 500 MG PO TABS: 1000.0000 mg | ORAL_TABLET | Freq: Three times a day (TID) | ORAL | 0 refills | Status: DC

## 2019-02-17 MED ORDER — OLANZAPINE-FLUOXETINE HCL 6-50 MG PO CAPS
3.00 | ORAL_CAPSULE | ORAL | Status: DC
Start: ? — End: 2019-02-17

## 2019-02-17 MED ORDER — Medication
324.00 | Status: DC
Start: 2019-02-18 — End: 2019-02-17

## 2019-02-17 MED ORDER — PANTOPRAZOLE SODIUM 40 MG PO TBEC
40.00 | DELAYED_RELEASE_TABLET | ORAL | Status: DC
Start: 2019-02-18 — End: 2019-02-17

## 2019-02-17 MED ORDER — HEPARIN (PORCINE) IN D5W 100-5 UNIT/ML-% IV SOLN
1.00 | INTRAVENOUS | Status: DC
Start: ? — End: 2019-02-17

## 2019-02-17 MED ORDER — DICLOXACILLIN SODIUM 62.5 MG/5ML PO SUSR
10.00 | ORAL | Status: DC
Start: 2019-02-18 — End: 2019-02-17

## 2019-02-17 MED ORDER — RIBAVIRIN-INTERFERON ALFA-2B 1200/3000000 UNIT/0.5ML CO KIT
10.00 | PACK | Status: DC
Start: 2019-02-17 — End: 2019-02-17

## 2019-02-17 MED ORDER — OMEPRAZOLE 20 MG PO CPDR: 20.0000 mg | DELAYED_RELEASE_CAPSULE | Freq: Two times a day (BID) | ORAL | 0 refills | Status: DC

## 2019-02-17 MED ORDER — SELECT BRAND INSULIN SYRINGE 29G X 1/2" 1 ML MISC
1.00 | Status: DC
Start: 2019-02-17 — End: 2019-02-17

## 2019-02-17 MED ORDER — CLARITHROMYCIN 500 MG PO TABS: 500.0000 mg | ORAL_TABLET | Freq: Two times a day (BID) | ORAL | 0 refills | Status: DC

## 2019-02-17 MED ORDER — METRISET BURETTE SET MISC
Status: DC
Start: ? — End: 2019-02-17

## 2019-02-17 NOTE — Telephone Encounter (Signed)
Spoke with pt she states she was discharged from [facility] today. I informed pt of her lab results and Dr. Georgeann Oppenheim recommendations for pt to commence on treatment for the H pylori and for pt to be referred to Dr. Tasia Catchings in hematology for anemia. Pt agrees to Dr. Georgeann Oppenheim plan.

## 2019-02-25 DIAGNOSIS — C679 Malignant neoplasm of bladder, unspecified: Secondary | ICD-10-CM | POA: Diagnosis not present

## 2019-02-25 DIAGNOSIS — C7989 Secondary malignant neoplasm of other specified sites: Secondary | ICD-10-CM | POA: Diagnosis not present

## 2019-02-25 DIAGNOSIS — I82421 Acute embolism and thrombosis of right iliac vein: Secondary | ICD-10-CM | POA: Diagnosis not present

## 2019-02-25 DIAGNOSIS — R1909 Other intra-abdominal and pelvic swelling, mass and lump: Secondary | ICD-10-CM | POA: Diagnosis not present

## 2019-02-25 DIAGNOSIS — I82411 Acute embolism and thrombosis of right femoral vein: Secondary | ICD-10-CM | POA: Diagnosis not present

## 2019-02-27 ENCOUNTER — Ambulatory Visit: Payer: Medicare Other | Admitting: Obstetrics & Gynecology

## 2019-03-02 ENCOUNTER — Inpatient Hospital Stay: Payer: Medicare Other | Attending: Oncology

## 2019-03-02 DIAGNOSIS — M810 Age-related osteoporosis without current pathological fracture: Secondary | ICD-10-CM | POA: Insufficient documentation

## 2019-03-02 DIAGNOSIS — I82411 Acute embolism and thrombosis of right femoral vein: Secondary | ICD-10-CM | POA: Insufficient documentation

## 2019-03-02 DIAGNOSIS — D509 Iron deficiency anemia, unspecified: Secondary | ICD-10-CM | POA: Insufficient documentation

## 2019-03-02 DIAGNOSIS — R918 Other nonspecific abnormal finding of lung field: Secondary | ICD-10-CM | POA: Insufficient documentation

## 2019-03-02 DIAGNOSIS — E1165 Type 2 diabetes mellitus with hyperglycemia: Secondary | ICD-10-CM | POA: Insufficient documentation

## 2019-03-02 DIAGNOSIS — E785 Hyperlipidemia, unspecified: Secondary | ICD-10-CM | POA: Insufficient documentation

## 2019-03-02 DIAGNOSIS — N189 Chronic kidney disease, unspecified: Secondary | ICD-10-CM | POA: Insufficient documentation

## 2019-03-02 DIAGNOSIS — C679 Malignant neoplasm of bladder, unspecified: Secondary | ICD-10-CM | POA: Insufficient documentation

## 2019-03-02 DIAGNOSIS — R531 Weakness: Secondary | ICD-10-CM | POA: Insufficient documentation

## 2019-03-02 DIAGNOSIS — I129 Hypertensive chronic kidney disease with stage 1 through stage 4 chronic kidney disease, or unspecified chronic kidney disease: Secondary | ICD-10-CM | POA: Insufficient documentation

## 2019-03-02 DIAGNOSIS — R5383 Other fatigue: Secondary | ICD-10-CM | POA: Insufficient documentation

## 2019-03-02 DIAGNOSIS — Z7901 Long term (current) use of anticoagulants: Secondary | ICD-10-CM | POA: Insufficient documentation

## 2019-03-02 DIAGNOSIS — R109 Unspecified abdominal pain: Secondary | ICD-10-CM | POA: Insufficient documentation

## 2019-03-02 DIAGNOSIS — Z79899 Other long term (current) drug therapy: Secondary | ICD-10-CM | POA: Insufficient documentation

## 2019-03-02 DIAGNOSIS — Z87891 Personal history of nicotine dependence: Secondary | ICD-10-CM | POA: Insufficient documentation

## 2019-03-03 ENCOUNTER — Encounter: Payer: Self-pay | Admitting: Oncology

## 2019-03-03 ENCOUNTER — Other Ambulatory Visit: Payer: Self-pay

## 2019-03-03 ENCOUNTER — Inpatient Hospital Stay: Admission: RE | Admit: 2019-03-03 | Payer: Medicare Other | Source: Ambulatory Visit

## 2019-03-03 NOTE — Progress Notes (Signed)
Patient stated that she continues to have pain and warm at touch on her right leg. Patient stated that she had an ultrasound done and blood clots were found. Patient stated that she was given Eliquis 5 MG to take daily.

## 2019-03-04 ENCOUNTER — Other Ambulatory Visit: Payer: Self-pay

## 2019-03-04 ENCOUNTER — Inpatient Hospital Stay (HOSPITAL_BASED_OUTPATIENT_CLINIC_OR_DEPARTMENT_OTHER): Payer: Medicare Other | Admitting: Oncology

## 2019-03-04 ENCOUNTER — Inpatient Hospital Stay: Payer: Medicare Other

## 2019-03-04 VITALS — BP 134/67 | HR 105 | Temp 97.9°F | Ht 64.0 in | Wt 182.0 lb

## 2019-03-04 DIAGNOSIS — I82411 Acute embolism and thrombosis of right femoral vein: Secondary | ICD-10-CM | POA: Diagnosis not present

## 2019-03-04 DIAGNOSIS — R918 Other nonspecific abnormal finding of lung field: Secondary | ICD-10-CM | POA: Diagnosis not present

## 2019-03-04 DIAGNOSIS — R109 Unspecified abdominal pain: Secondary | ICD-10-CM | POA: Diagnosis not present

## 2019-03-04 DIAGNOSIS — C679 Malignant neoplasm of bladder, unspecified: Secondary | ICD-10-CM

## 2019-03-04 DIAGNOSIS — M810 Age-related osteoporosis without current pathological fracture: Secondary | ICD-10-CM | POA: Diagnosis not present

## 2019-03-04 DIAGNOSIS — E1165 Type 2 diabetes mellitus with hyperglycemia: Secondary | ICD-10-CM | POA: Diagnosis not present

## 2019-03-04 DIAGNOSIS — D500 Iron deficiency anemia secondary to blood loss (chronic): Secondary | ICD-10-CM

## 2019-03-04 DIAGNOSIS — Z87891 Personal history of nicotine dependence: Secondary | ICD-10-CM | POA: Diagnosis not present

## 2019-03-04 DIAGNOSIS — N189 Chronic kidney disease, unspecified: Secondary | ICD-10-CM | POA: Diagnosis not present

## 2019-03-04 DIAGNOSIS — D509 Iron deficiency anemia, unspecified: Secondary | ICD-10-CM | POA: Diagnosis not present

## 2019-03-04 DIAGNOSIS — I129 Hypertensive chronic kidney disease with stage 1 through stage 4 chronic kidney disease, or unspecified chronic kidney disease: Secondary | ICD-10-CM | POA: Diagnosis not present

## 2019-03-04 DIAGNOSIS — R531 Weakness: Secondary | ICD-10-CM | POA: Diagnosis not present

## 2019-03-04 DIAGNOSIS — R5383 Other fatigue: Secondary | ICD-10-CM | POA: Diagnosis not present

## 2019-03-04 DIAGNOSIS — Z7901 Long term (current) use of anticoagulants: Secondary | ICD-10-CM | POA: Diagnosis not present

## 2019-03-04 DIAGNOSIS — E785 Hyperlipidemia, unspecified: Secondary | ICD-10-CM | POA: Diagnosis not present

## 2019-03-04 DIAGNOSIS — Z79899 Other long term (current) drug therapy: Secondary | ICD-10-CM | POA: Diagnosis not present

## 2019-03-04 LAB — IRON AND TIBC
Iron: 46 ug/dL (ref 28–170)
Saturation Ratios: 17 % (ref 10.4–31.8)
TIBC: 267 ug/dL (ref 250–450)
UIBC: 221 ug/dL

## 2019-03-04 LAB — COMPREHENSIVE METABOLIC PANEL
ALT: 17 U/L (ref 0–44)
AST: 27 U/L (ref 15–41)
Albumin: 3.7 g/dL (ref 3.5–5.0)
Alkaline Phosphatase: 122 U/L (ref 38–126)
Anion gap: 8 (ref 5–15)
BUN: 26 mg/dL — ABNORMAL HIGH (ref 8–23)
CO2: 23 mmol/L (ref 22–32)
Calcium: 9.4 mg/dL (ref 8.9–10.3)
Chloride: 110 mmol/L (ref 98–111)
Creatinine, Ser: 1.54 mg/dL — ABNORMAL HIGH (ref 0.44–1.00)
GFR calc Af Amer: 39 mL/min — ABNORMAL LOW (ref >60)
GFR calc non Af Amer: 34 mL/min — ABNORMAL LOW (ref >60)
Glucose, Bld: 116 mg/dL — ABNORMAL HIGH (ref 70–99)
Potassium: 4.6 mmol/L (ref 3.5–5.1)
Sodium: 141 mmol/L (ref 135–145)
Total Bilirubin: 0.3 mg/dL (ref 0.3–1.2)
Total Protein: 7.9 g/dL (ref 6.5–8.1)

## 2019-03-04 LAB — CBC WITH DIFFERENTIAL/PLATELET
Abs Immature Granulocytes: 0.02 10*3/uL (ref 0.00–0.07)
Basophils Absolute: 0 10*3/uL (ref 0.0–0.1)
Basophils Relative: 1 %
Eosinophils Absolute: 0.1 10*3/uL (ref 0.0–0.5)
Eosinophils Relative: 2 %
HCT: 27.1 % — ABNORMAL LOW (ref 36.0–46.0)
Hemoglobin: 8.3 g/dL — ABNORMAL LOW (ref 12.0–15.0)
Immature Granulocytes: 0 %
Lymphocytes Relative: 16 %
Lymphs Abs: 1 10*3/uL (ref 0.7–4.0)
MCH: 24.1 pg — ABNORMAL LOW (ref 26.0–34.0)
MCHC: 30.6 g/dL (ref 30.0–36.0)
MCV: 78.8 fL — ABNORMAL LOW (ref 80.0–100.0)
Monocytes Absolute: 0.5 10*3/uL (ref 0.1–1.0)
Monocytes Relative: 8 %
Neutro Abs: 4.6 10*3/uL (ref 1.7–7.7)
Neutrophils Relative %: 73 %
Platelets: 297 10*3/uL (ref 150–400)
RBC: 3.44 MIL/uL — ABNORMAL LOW (ref 3.87–5.11)
RDW: 16.2 % — ABNORMAL HIGH (ref 11.5–15.5)
WBC: 6.3 10*3/uL (ref 4.0–10.5)
nRBC: 0 % (ref 0.0–0.2)

## 2019-03-04 LAB — RETIC PANEL
Immature Retic Fract: 5.7 % (ref 2.3–15.9)
RBC.: 3.44 MIL/uL — ABNORMAL LOW (ref 3.87–5.11)
Retic Count, Absolute: 40.6 10*3/uL (ref 19.0–186.0)
Retic Ct Pct: 1.2 % (ref 0.4–3.1)
Reticulocyte Hemoglobin: 25.7 pg — ABNORMAL LOW (ref >27.9)

## 2019-03-04 LAB — FERRITIN: Ferritin: 316 ng/mL — ABNORMAL HIGH (ref 11–307)

## 2019-03-04 MED ORDER — SENNA 8.6 MG PO TABS: 2.0000 | ORAL_TABLET | Freq: Every day | ORAL | 0 refills | Status: DC

## 2019-03-05 ENCOUNTER — Telehealth: Payer: Self-pay

## 2019-03-05 NOTE — Telephone Encounter (Signed)
Contacted patient in regards to her not going for COVID Test, and to reschedule her colonoscopy w/EGD with Dr. Vicente Males.  She stated that she would have to call back to schedule at a later time.  Thanks [employer]

## 2019-03-05 NOTE — Progress Notes (Signed)
Hematology/Oncology follow up note [facility] [phone] Fax:([phone]) [phone]   Patient Care Team: Angela American, PA-C as PCP - General (Family Medicine) Angela Russell, OD (Optometry)  REFERRING PROVIDER: Dr.Sninsky CHIEF COMPLAINTS/REASON FOR VISIT:  Follow up for bladder cancer, anemia.   HISTORY OF PRESENTING ILLNESS:  Angela Russell is a  68 y.o.  female with PMH listed below who was referred to me for evaluation of newly diagnosed bladder cancer. Patient initially presented to emergency room at the end of January 2020 for evaluation of dysuria, hematuria and the left lower quadrant inguinal pain and flank pain.  1/28 2020 CT renal stone study showed suspected irregular wall thickening about the superior bladder, not well assessed due to degree of bladder distention.  Recommend cystoscopy for further evaluation.  No renal stone or obstructive uropathy.  Patient was given IV Rocephin and referred patient for outpatient urology follow-up.  Urine culture was negative.  She again presented to ER after 2 days with similar symptoms.  Patient has 25-pack-year smoking history, quit approximately 20 years ago.  No family history of any urology malignancies 07/03/2018 another CT abdomen pelvis with contrast was done which showed no nephrolithiasis or hydronephrosis is identified.  Bladder is decompressed limiting evaluation.  07/16/2018.urology Dr. Jeb Russell - cystoscopy and bilateral retrograde pyelogram on 07/16/2018.  Pyelogram did not show any filling defect or abnormalities.  No hydronephrosis.  Ureteral orifice was not involved with tumor.  There is a large 5 cm posterior wall bladder tumor, bullous and sessile appearing.  Patient underwent TURBT.   Pathology: High-grade urothelial carcinoma, invasive into muscularis propria.  Lymphovascular invasion is present.  Carcinoma in situ is also identified.  Focal squamous differentiation is noted, areas of  invasive carcinoma display pleomorphic/sarcomatoid changes.  T2b  08/07/2018 CT without contrast negative.  2 subpleural right upper lobe nodule 2 to 3 mm likely benign. She also had baseline audiometry done.  08/04/2018 ddMVAC x 1 cycle, stopped due to intolerance and AKI.      INTERVAL HISTORY Angela [facility] is a 68 y.o. female who has above history reviewed by me today presents for follow up visit for evaluation for high-grade urothelial carcinoma of the bladder  Patient received 1 cycle of dd MVAC, not able to tolerate due to AKI. Patient then was referred to [facility] urology  09/22/2018 patient underwent a cystectomy, pathology pT3a N0 Mx.  11 lymph nodes were harvested and was all negative. 10/14/2018 patient was admitted due to pyelonephritis and a pelvic fluid collection.  Drain was placed. 10/23/2018 drain was removed. Patient has had difficulties getting to her appointments to [facility]. 02/09/2019, patient presented with abdominal pain. CT concerning for small bowel obstruction and increased size of right pelvic fluid collection concerning for cancer recurrence.  Right hydronephrosis to the level of pelvis and enlarged lymph nodes. Patient had JP drain placed with CT guidance to pelvic fluid collection and drained 400 cc amber fluid.  Culture was negative for growth of microorganisms and cytology was negative for malignancy.-JP drain was removed on the day of discharge. CT-guided core biopsy of pelvic lymph node adenopathy was attempted but not successful. Patient was scheduled to have a PET scan done however she is severely claustrophobic and not able to get a PET scan done. 02/26/2019 transvenous biopsy by IR unsuccessful transvenous biopsy of the right pelvis mass. Post biopsy acute thrombus in the right external iliac and common femoral vein.  Patient was recommended to start anticoagulation with  Xarelto however due to the Russell-pay, patient is not able to afford the medication.  Anticoagulation regimen was switched to Eliquis 5 mg.  Patient reports that she has been taking it once a day.  Patient was referred back to [facility] to be closer to her home and hopefully be able to take future treatments locally. She has an appointment with Dr. Aline [facility] at [facility] on 03/13/2019 for discussion of management plan.  Today she reports that her right lower extremity is still swollen.  Denies any shortness of breath.  Denies any acute bleeding events.  Reported feeling quite tired and fatigued.  Review of Systems  Constitutional: Positive for fatigue. Negative for appetite change, chills and fever.  HENT:   Negative for hearing loss and voice change.   Eyes: Negative for eye problems.  Respiratory: Negative for chest tightness and cough.   Cardiovascular: Positive for leg swelling. Negative for chest pain.  Gastrointestinal: Negative for abdominal distention, abdominal pain and blood in stool.  Endocrine: Negative for hot flashes.  Genitourinary: Negative for difficulty urinating and frequency.   Musculoskeletal: Negative for arthralgias.  Skin: Negative for itching and rash.  Neurological: Negative for extremity weakness.  Hematological: Negative for adenopathy.  Psychiatric/Behavioral: Negative for confusion. The patient is not nervous/anxious.     MEDICAL HISTORY:  Past Medical History:  Diagnosis Date  . Carotid artery plaque, right 01/2014  . CKD (chronic kidney disease)    stage 2-3  . Diabetes mellitus without complication ([place])   . DM (diabetes mellitus), type 2, uncontrolled ([place])   . Hyperlipidemia   . Hypertension   . Hypochromic microcytic anemia    mild  . Osteoporosis   . Renal insufficiency   . Rotator cuff tendonitis, right     SURGICAL HISTORY: Past Surgical History:  Procedure Laterality Date  . ABDOMINAL HYSTERECTOMY  2000   due to bleeding and fibroids, partial- still has ovaries  . CYSTOSCOPY W/ RETROGRADES Bilateral 07/16/2018   Procedure:  CYSTOSCOPY WITH RETROGRADE PYELOGRAM;  Surgeon: Angela Co, MD;  Location: [facility] ORS;  Service: Urology;  Laterality: Bilateral;  . KNEE SURGERY Left 03/17/2013   torn meniscus  . PORTA CATH INSERTION N/A 08/06/2018   Procedure: PORTA CATH INSERTION;  Surgeon: Algernon Huxley, MD;  Location: [place] [facility];  Service: Cardiovascular;  Laterality: N/A;  . TRANSURETHRAL RESECTION OF BLADDER TUMOR N/A 07/16/2018   Procedure: TRANSURETHRAL RESECTION OF BLADDER TUMOR (TURBT);  Surgeon: Angela Co, MD;  Location: [facility] ORS;  Service: Urology;  Laterality: N/A;    SOCIAL HISTORY: Social History   Socioeconomic History  . Marital status: Single    Spouse name: Not on file  . Number of children: 1  . Years of education: Not on file  . Highest education level: Not on file  Occupational History  . Not on file  Social Needs  . Financial resource strain: Not hard at all  . Food insecurity    Worry: Never true    Inability: Never true  . Transportation needs    Medical: No    Non-medical: No  Tobacco Use  . Smoking status: Former Smoker    Quit date: 06/05/1991    Years since quitting: 27.7  . Smokeless tobacco: Never Used  Substance and Sexual Activity  . Alcohol use: No  . Drug use: No  . Sexual activity: Never  Lifestyle  . Physical activity    Days per week: 2 days    Minutes per session: 30 min  .  Stress: Not at all  Relationships  . Social connections    Talks on phone: More than three times a week    Gets together: More than three times a week    Attends religious service: More than 4 times per year    Active member of club or organization: Yes    Attends meetings of clubs or organizations: More than 4 times per year    Relationship status: Separated  . Intimate partner violence    Fear of current or ex partner: No    Emotionally abused: No    Physically abused: No    Forced sexual activity: No  Other Topics Concern  . Not on file  Social History Narrative    Working full time    FAMILY HISTORY: Family History  Problem Relation Age of Onset  . Heart disease Mother   . Heart attack Mother   . Arthritis Father   . Diabetes Brother     ALLERGIES:  has No Known Allergies.  MEDICATIONS:  Current Outpatient Medications  Medication Sig Dispense Refill  . amLODipine (NORVASC) 5 MG tablet Take 1 tablet (5 mg total) by mouth daily. 90 tablet 1  . apixaban (ELIQUIS) 5 MG TABS tablet Take 5 mg by mouth 1 day or 1 dose.    . diclofenac sodium (VOLTAREN) 1 % GEL Apply 2 g topically 4 (four) times daily. 100 g 2  . polyethylene glycol (MIRALAX / GLYCOLAX) 17 g packet Take 17 g by mouth daily as needed for mild constipation. 14 each 0  . pravastatin (PRAVACHOL) 10 MG tablet Take 1 tablet (10 mg total) by mouth at bedtime. 90 tablet 1  . senna (SENOKOT) 8.6 MG TABS tablet Take 2 tablets (17.2 mg total) by mouth daily. 60 tablet 0   No current facility-administered medications for this visit.      PHYSICAL EXAMINATION: ECOG PERFORMANCE STATUS: 2 - Symptomatic, <50% confined to bed Vitals:   03/04/19 1336  BP: 134/67  Pulse: (!) 105  Temp: 97.9 F (36.6 C)   Filed Weights   03/04/19 1336  Weight: 182 lb (82.6 kg)    Physical Exam Constitutional:      General: She is not in acute distress.    Comments: Sitting in wheelchair  HENT:     Head: Normocephalic and atraumatic.  Eyes:     General: No scleral icterus.    Pupils: Pupils are equal, round, and reactive to light.  Neck:     Musculoskeletal: Normal range of motion and neck supple.  Cardiovascular:     Rate and Rhythm: Normal rate and regular rhythm.     Heart sounds: Normal heart sounds.  Pulmonary:     Effort: Pulmonary effort is normal. No respiratory distress.     Breath sounds: No wheezing.  Abdominal:     General: Bowel sounds are normal. There is no distension.     Palpations: Abdomen is soft. There is no mass.     Tenderness: There is no abdominal tenderness.   Musculoskeletal: Normal range of motion.        General: No deformity.  Skin:    General: Skin is warm and dry.     Coloration: Skin is pale.     Findings: No erythema or rash.  Neurological:     Mental Status: She is alert and oriented to person, place, and time.     Cranial Nerves: No cranial nerve deficit.     Coordination: Coordination normal.  Psychiatric:  Behavior: Behavior normal.        Thought Content: Thought content normal.     RADIOGRAPHIC STUDIES: I have personally reviewed the radiological images as listed and agreed with the findings in the report.  CMP Latest Ref Rng & Units 03/04/2019  Glucose 70 - 99 mg/dL 116(H)  BUN 8 - 23 mg/dL 26(H)  Creatinine 0.44 - 1.00 mg/dL 1.54(H)  Sodium 135 - 145 mmol/L 141  Potassium 3.5 - 5.1 mmol/L 4.6  Chloride 98 - 111 mmol/L 110  CO2 22 - 32 mmol/L 23  Calcium 8.9 - 10.3 mg/dL 9.4  Total Protein 6.5 - 8.1 g/dL 7.9  Total Bilirubin 0.3 - 1.2 mg/dL 0.3  Alkaline Phos 38 - 126 U/L 122  AST 15 - 41 U/L 27  ALT 0 - 44 U/L 17   CBC Latest Ref Rng & Units 03/04/2019  WBC 4.0 - 10.5 K/uL 6.3  Hemoglobin 12.0 - 15.0 g/dL 8.3(L)  Hematocrit 36.0 - 46.0 % 27.1(L)  Platelets 150 - 400 K/uL 297    LABORATORY DATA:  I have reviewed the data as listed Lab Results  Component Value Date   WBC 6.3 03/04/2019   HGB 8.3 (L) 03/04/2019   HCT 27.1 (L) 03/04/2019   MCV 78.8 (L) 03/04/2019   PLT 297 03/04/2019   Recent Labs    12/19/18 1841 12/20/18 0004 12/20/18 0514 12/21/18 0852 03/04/19 1423  NA 137 138 139 143 141  K 6.3* 5.7* 5.0 3.7 4.6  CL 115* 112* 111 101 110  CO2 15* 19* 22 31 23   GLUCOSE 129* 87 106* 142* 116*  BUN 37* 33* 30* 21 26*  CREATININE 2.03* 1.80* 1.76* 1.77* 1.54*  CALCIUM 9.7 9.4 9.0 8.8* 9.4  GFRNONAA 24* 28* 29* 29* 34*  GFRAA 28* 33* 34* 33* 39*  PROT  --  6.7 6.3*  --  7.9  ALBUMIN  --  3.6 3.3*  --  3.7  AST  --  16 17  --  27  ALT  --  9 9  --  17  ALKPHOS  --  69 67  --  122   BILITOT 0.5 0.5 0.3  --  0.3  BILIDIR <0.1  --   --   --   --   IBILI NOT CALCULATED  --   --   --   --    Iron/TIBC/Ferritin/ %Sat    Component Value Date/Time   IRON 46 03/04/2019 1423   IRON 26 (L) 02/04/2019 1309   TIBC 267 03/04/2019 1423   TIBC 251 02/04/2019 1309   FERRITIN 316 (H) 03/04/2019 1423   FERRITIN 409 (H) 02/04/2019 1309   IRONPCTSAT 17 03/04/2019 1423   IRONPCTSAT 10 (L) 02/04/2019 1309      ASSESSMENT & PLAN:  1. Microcytic anemia   2. Urothelial carcinoma of bladder ([place])   3. Acute deep vein thrombosis (DVT) of femoral vein of right lower extremity (HCC)   Cancer Staging Bladder carcinoma [facility]) Staging form: Urinary Bladder, AJCC 8th Edition - Clinical stage from 08/01/2018: Stage II (cT2, cN0, cM0) - Signed by [name], MD on 08/03/2018  #Urothelial carcinoma of bladder, Results of recent biopsy of the pelvic sidewall mass showed metastatic carcinoma consistent with involvement by urothelial carcinoma. Discussed with patient now she has stage IV metastatic bladder cancer, I recommend starting systemic chemotherapy.  Consider carboplatin/gemcitabine regimen or gemcitabine alone.  Followed by Avelumab maintenance.  Patient prefers to first discuss with Dr. Aline [facility] at [facility] before  she makes her final decision.  #Microcytic anemia, chronic.  MCV is chronically low. Iron panels are checked today.  Ferritin 316, iron saturation 17, TIBC 267.Not consistent with severe iron deficiency.  Normal vitamin B12 and folate level. She may have underlying hemoglobinopathy, I have sent out the test to confirm. Anemia most likely is secondary to chronic kidney disease.  May consider IV iron to further improve her iron store and hopefully improve hemoglobin levels. Allergy reactions/infusion reaction including anaphylactic reaction discussed with patient. Other side effects include but not limited to high blood pressure, skin rash, weight gain, leg swelling, etc. Patient voices  understanding and willing to proceed.  Erythropoietin therapy is not considered due to active cancer.  -labs are reviewed. Microcytic anemia, low retic hemoglobin. Hemoglobinopathy evaluation was normal.  Would offer patient to have IV venofer 200mg  x 2 to further improve iron store.    Orders Placed This Encounter  Procedures  . Iron and TIBC    Standing Status:   Future    Number of Occurrences:   1    Standing Expiration Date:   03/03/2020  . Ferritin    Standing Status:   Future    Number of Occurrences:   1    Standing Expiration Date:   03/03/2020    All questions were answered. The patient knows to call the clinic with any problems questions or concerns.  Return of visit: 3 weeks.  We spent sufficient time to discuss many aspect of care, questions were answered to patient's satisfaction. Total face to face encounter time for this patient visit was 40 min. >50% of the time was  spent in counseling and coordination of care.    [name], MD, PhD Hematology Oncology [facility] at [facility] Pager- [phone] 03/05/19

## 2019-03-06 ENCOUNTER — Other Ambulatory Visit: Payer: Self-pay

## 2019-03-06 ENCOUNTER — Emergency Department: Payer: Medicare Other

## 2019-03-06 ENCOUNTER — Ambulatory Visit: Admission: RE | Admit: 2019-03-06 | Payer: Medicare Other | Source: Home / Self Care | Admitting: Gastroenterology

## 2019-03-06 ENCOUNTER — Encounter: Admission: RE | Payer: Self-pay | Source: Home / Self Care

## 2019-03-06 ENCOUNTER — Emergency Department
Admission: EM | Admit: 2019-03-06 | Discharge: 2019-03-06 | Disposition: A | Discharge status: Home / Self Care | Payer: Medicare Other | Attending: Emergency Medicine | Admitting: Emergency Medicine

## 2019-03-06 DIAGNOSIS — E1122 Type 2 diabetes mellitus with diabetic chronic kidney disease: Secondary | ICD-10-CM | POA: Diagnosis not present

## 2019-03-06 DIAGNOSIS — Z7901 Long term (current) use of anticoagulants: Secondary | ICD-10-CM | POA: Insufficient documentation

## 2019-03-06 DIAGNOSIS — R2241 Localized swelling, mass and lump, right lower limb: Secondary | ICD-10-CM | POA: Diagnosis present

## 2019-03-06 DIAGNOSIS — N182 Chronic kidney disease, stage 2 (mild): Secondary | ICD-10-CM | POA: Diagnosis not present

## 2019-03-06 DIAGNOSIS — Z79899 Other long term (current) drug therapy: Secondary | ICD-10-CM | POA: Insufficient documentation

## 2019-03-06 DIAGNOSIS — I129 Hypertensive chronic kidney disease with stage 1 through stage 4 chronic kidney disease, or unspecified chronic kidney disease: Secondary | ICD-10-CM | POA: Insufficient documentation

## 2019-03-06 DIAGNOSIS — I82421 Acute embolism and thrombosis of right iliac vein: Secondary | ICD-10-CM | POA: Diagnosis not present

## 2019-03-06 DIAGNOSIS — Z87891 Personal history of nicotine dependence: Secondary | ICD-10-CM | POA: Insufficient documentation

## 2019-03-06 DIAGNOSIS — I82411 Acute embolism and thrombosis of right femoral vein: Secondary | ICD-10-CM | POA: Diagnosis not present

## 2019-03-06 LAB — HEMOGLOBINOPATHY EVALUATION
Hgb A2 Quant: 2 % (ref 1.8–3.2)
Hgb A: 98 % (ref 96.4–98.8)
Hgb C: 0 %
Hgb F Quant: 0 % (ref 0.0–2.0)
Hgb S Quant: 0 %
Hgb Variant: 0 %

## 2019-03-06 SURGERY — COLONOSCOPY WITH PROPOFOL
Anesthesia: General

## 2019-03-06 NOTE — ED Provider Notes (Signed)
[facility] Emergency Department Provider Note   ____________________________________________   First MD Initiated Contact with Patient 03/06/19 2207     (approximate)  I have reviewed the triage vital signs and the nursing notes.   HISTORY  Chief Complaint Leg Swelling    HPI Angela [facility] is a 68 y.o. female with past medical history of bladder cancer, hypertension, diabetes, CKD who presents to the ED complaining of leg swelling.  Patient reports that she has been dealing with swelling in her right leg for at least the past week.  She states she had surgery at [facility] last week, where they did a biopsy of lymph nodes in her right groin that were concerning for recurrence of her cancer.  She states that at that time they discovered that she had a DVT in her femoral vein and she was started on Eliquis.  She reports taking the Eliquis for 2 days, but was concerned that the swelling seem to be worsening.  She denies significant pain in her leg, does describe some mild aching around her inguinal area.  She has not noticed any changes to her skin of the leg.  She denies any chest pain or shortness of breath.        Past Medical History:  Diagnosis Date  . Carotid artery plaque, right 01/2014  . CKD (chronic kidney disease)    stage 2-3  . Diabetes mellitus without complication ([place])   . DM (diabetes mellitus), type 2, uncontrolled ([place])   . Hyperlipidemia   . Hypertension   . Hypochromic microcytic anemia    mild  . Osteoporosis   . Renal insufficiency   . Rotator cuff tendonitis, right     Patient Active Problem List   Diagnosis Date Noted  . Vaginal pain 01/27/2019  . Neuropathic pain 01/27/2019  . Hyperkalemia 12/19/2018  . Fluid collection at surgical site 10/17/2018  . Tachycardia 10/15/2018  . Pyelonephritis 10/14/2018  . Iron deficiency anemia due to chronic blood loss 08/26/2018  . AKI (acute kidney injury) ([place]) 08/06/2018  . Bladder  carcinoma ([place]) 08/03/2018  . Goals of care, counseling/discussion 08/03/2018  . Bladder tumor 07/16/2018  . Hyperlipidemia   . Hypertension associated with diabetes ([place])   . Controlled diabetes mellitus type 2 with complications ([place])   . Renal insufficiency   . CKD (chronic kidney disease)   . Hypochromic microcytic anemia   . Chronic anemia 07/24/2016  . Carotid artery plaque, right 01/02/2014  . Cervical facet joint syndrome 03/20/2013  . Spondylolisthesis of lumbar region 03/20/2013    Past Surgical History:  Procedure Laterality Date  . ABDOMINAL HYSTERECTOMY  2000   due to bleeding and fibroids, partial- still has ovaries  . CYSTOSCOPY W/ RETROGRADES Bilateral 07/16/2018   Procedure: CYSTOSCOPY WITH RETROGRADE PYELOGRAM;  Surgeon: Billey Co, MD;  Location: [facility] ORS;  Service: Urology;  Laterality: Bilateral;  . KNEE SURGERY Left 03/17/2013   torn meniscus  . PORTA CATH INSERTION N/A 08/06/2018   Procedure: PORTA CATH INSERTION;  Surgeon: Algernon Huxley, MD;  Location: [place] CV LAB;  Service: Cardiovascular;  Laterality: N/A;  . TRANSURETHRAL RESECTION OF BLADDER TUMOR N/A 07/16/2018   Procedure: TRANSURETHRAL RESECTION OF BLADDER TUMOR (TURBT);  Surgeon: Billey Co, MD;  Location: [facility] ORS;  Service: Urology;  Laterality: N/A;    Prior to Admission medications   Medication Sig Start Date End Date Taking? Authorizing Provider  amLODipine (NORVASC) 5 MG tablet Take 1 tablet (5  mg total) by mouth daily. 12/18/18   Volney American, PA-C  apixaban (ELIQUIS) 5 MG TABS tablet Take 5 mg by mouth 1 day or 1 dose.    [provider]  diclofenac sodium (VOLTAREN) 1 % GEL Apply 2 g topically 4 (four) times daily. 12/18/18   Volney American, PA-C  polyethylene glycol (MIRALAX / GLYCOLAX) 17 g packet Take 17 g by mouth daily as needed for mild constipation. 12/21/18   Vaughan Basta, MD  pravastatin (PRAVACHOL) 10 MG tablet Take 1 tablet (10 mg  total) by mouth at bedtime. 12/18/18   Volney American, PA-C  senna (SENOKOT) 8.6 MG TABS tablet Take 2 tablets (17.2 mg total) by mouth daily. 03/04/19   [name], MD  prochlorperazine (COMPAZINE) 10 MG tablet Take 1 tablet (10 mg total) by mouth every 6 (six) hours as needed (Nausea or vomiting). 08/03/18 08/26/18  [name], MD    Allergies Patient has no known allergies.  Family History  Problem Relation Age of Onset  . Heart disease Mother   . Heart attack Mother   . Arthritis Father   . Diabetes Brother     Social History Social History   Tobacco Use  . Smoking status: Former Smoker    Quit date: 06/05/1991    Years since quitting: 27.7  . Smokeless tobacco: Never Used  Substance Use Topics  . Alcohol use: No  . Drug use: No    Review of Systems  Constitutional: No fever/chills Eyes: No visual changes. ENT: No sore throat. Cardiovascular: Denies chest pain. Respiratory: Denies shortness of breath. Gastrointestinal: No abdominal pain.  No nausea, no vomiting.  No diarrhea.  No constipation. Genitourinary: Negative for dysuria. Musculoskeletal: Negative for back pain.  Positive for leg swelling. Skin: Negative for rash. Neurological: Negative for headaches, focal weakness or numbness.  ____________________________________________   PHYSICAL EXAM:  VITAL SIGNS: ED Triage Vitals  Enc Vitals Group     BP 03/06/19 1942 (!) 146/77     Pulse Rate 03/06/19 1942 96     Resp 03/06/19 1942 16     Temp 03/06/19 1942 98.3 F (36.8 C)     Temp Source 03/06/19 1942 Oral     SpO2 03/06/19 1942 99 %     Weight 03/06/19 1939 183 lb (83 kg)     Height 03/06/19 1939 5\' 4"  (1.626 m)     Head Circumference --      Peak Flow --      Pain Score 03/06/19 1943 3     Pain Loc --      Pain Edu? --      Excl. in [place]? --     Constitutional: Alert and oriented. Eyes: Conjunctivae are normal. Head: Atraumatic. Nose: No congestion/rhinnorhea. Mouth/Throat: Mucous membranes  are moist. Neck: Normal ROM Cardiovascular: Normal rate, regular rhythm. Grossly normal heart sounds. Respiratory: Normal respiratory effort.  No retractions. Lungs CTAB. Gastrointestinal: Soft and nontender. No distention. Genitourinary: deferred Musculoskeletal: 2+ pitting edema to right lower extremity with tenderness along femoral venous distribution. Neurologic:  Normal speech and language. No gross focal neurologic deficits are appreciated. Skin:  Skin is warm, dry and intact. No rash noted. Psychiatric: Mood and affect are normal. Speech and behavior are normal.  ____________________________________________   LABS (all labs ordered are listed, but only abnormal results are displayed)  Labs Reviewed - No data to display   PROCEDURES  Procedure(s) performed (including Critical Care):  Procedures   ____________________________________________   INITIAL  IMPRESSION / ASSESSMENT AND PLAN / ED COURSE       68 year old female presents to the ED complaining of worsening swelling to her right lower extremity after recent diagnosis of DVT.  Ultrasound was obtained today that shows acute DVT in iliac and common femoral vein.  Reviewing the operative note from [place], it appears that the DVT was in the same location at that time.  She states she has been taking Eliquis for 2 days now and given this short period of time, would not change patient's anticoagulation or consider this a treatment failure.  Counseled patient to continue Eliquis and return to the ED for chest pain, shortness of breath, or other worsening symptoms.  Otherwise counseled patient to follow-up with her oncologist, patient agrees with plan.      ____________________________________________   FINAL CLINICAL IMPRESSION(S) / ED DIAGNOSES  Final diagnoses:  Acute deep vein thrombosis (DVT) of iliac vein of right lower extremity [facility])     ED Discharge Orders    None       Note:  This document was prepared  using Dragon voice recognition software and may include unintentional dictation errors.   Blake Divine, MD 03/07/19 [phone]

## 2019-03-06 NOTE — ED Triage Notes (Signed)
Pt to the er for swelling to the right lower leg that started on Saturday. Pt had surgery at [place] a week ago Tuesday where they went into her vein and they found 2 clots. Pt has noticeable swelling to the right lower leg.

## 2019-03-06 NOTE — ED Notes (Signed)
Pt denies shob, headache, chest pain. Pt with indwelling urinary catheter with clear yellow dilute urine noted in bag.

## 2019-03-10 ENCOUNTER — Telehealth: Payer: Self-pay

## 2019-03-10 NOTE — Telephone Encounter (Signed)
Patient contacted and agrees to IV venofer. Once scheduled she will be contacted with appt details.  She states that she is taking eliquis 2.5mg  every 12 hours.

## 2019-03-10 NOTE — Telephone Encounter (Signed)
-----   Message from [name], MD sent at 03/09/2019 11:19 PM EDT ----- Please let pt know that I recommend her to be started on IV venofer as we discussed during her visit. This may improve her blood level. pls schedule Venfoer weekly x 2.  Please clarify if she is taking her Eliquis 5mg  twice a day- check if she is taking twice daily. She was not sure during the visit.   I will see her in 3 weeks. Lab same day cbc-retic panel, MD +/- Venofer. Thanks.

## 2019-03-12 ENCOUNTER — Telehealth: Payer: Self-pay | Admitting: *Deleted

## 2019-03-12 NOTE — Telephone Encounter (Signed)
Per Dr.Yu to schedule pt for Venofer weekly x2 and lab/MD/+/- Venofer in 3 weeks. Appts were scheduled as requested. Pts daughter Ezequiel Kayser is aware of all appts scheduled And stated that she will make her mother aware.

## 2019-03-16 ENCOUNTER — Other Ambulatory Visit: Payer: Self-pay

## 2019-03-16 ENCOUNTER — Inpatient Hospital Stay: Payer: Medicare Other | Attending: Oncology

## 2019-03-16 ENCOUNTER — Ambulatory Visit
Admission: RE | Admit: 2019-03-16 | Discharge: 2019-03-16 | Disposition: A | Discharge status: Home / Self Care | Payer: Medicare Other | Source: Ambulatory Visit | Attending: Oncology | Admitting: Oncology

## 2019-03-16 ENCOUNTER — Telehealth: Payer: Self-pay

## 2019-03-16 VITALS — BP 133/69 | HR 86 | Temp 97.0°F | Resp 18

## 2019-03-16 DIAGNOSIS — Z90710 Acquired absence of both cervix and uterus: Secondary | ICD-10-CM | POA: Insufficient documentation

## 2019-03-16 DIAGNOSIS — Z791 Long term (current) use of non-steroidal anti-inflammatories (NSAID): Secondary | ICD-10-CM | POA: Insufficient documentation

## 2019-03-16 DIAGNOSIS — C679 Malignant neoplasm of bladder, unspecified: Secondary | ICD-10-CM

## 2019-03-16 DIAGNOSIS — I82411 Acute embolism and thrombosis of right femoral vein: Secondary | ICD-10-CM

## 2019-03-16 DIAGNOSIS — E785 Hyperlipidemia, unspecified: Secondary | ICD-10-CM | POA: Diagnosis not present

## 2019-03-16 DIAGNOSIS — D509 Iron deficiency anemia, unspecified: Secondary | ICD-10-CM | POA: Insufficient documentation

## 2019-03-16 DIAGNOSIS — Z79899 Other long term (current) drug therapy: Secondary | ICD-10-CM | POA: Diagnosis not present

## 2019-03-16 DIAGNOSIS — Z5112 Encounter for antineoplastic immunotherapy: Secondary | ICD-10-CM | POA: Insufficient documentation

## 2019-03-16 DIAGNOSIS — Z86718 Personal history of other venous thrombosis and embolism: Secondary | ICD-10-CM | POA: Insufficient documentation

## 2019-03-16 DIAGNOSIS — N179 Acute kidney failure, unspecified: Secondary | ICD-10-CM

## 2019-03-16 DIAGNOSIS — Z7901 Long term (current) use of anticoagulants: Secondary | ICD-10-CM | POA: Insufficient documentation

## 2019-03-16 MED ORDER — IRON SUCROSE 20 MG/ML IV SOLN
200.0000 mg | Freq: Once | INTRAVENOUS | Status: AC
  Administered 2019-03-16: 200 mg via INTRAVENOUS
  Filled 2019-03-16: qty 10

## 2019-03-16 MED ORDER — SODIUM CHLORIDE 0.9 % IV SOLN: 200.0000 mg | Freq: Once | INTRAVENOUS | Status: DC

## 2019-03-16 MED ORDER — HEPARIN SOD (PORK) LOCK FLUSH 100 UNIT/ML IV SOLN
500.0000 [IU] | Freq: Once | INTRAVENOUS | Status: AC | PRN
  Administered 2019-03-16: 500 [IU]
  Filled 2019-03-16: qty 5

## 2019-03-16 MED ORDER — SODIUM CHLORIDE 0.9 % IV SOLN
Freq: Once | INTRAVENOUS | Status: AC
  Administered 2019-03-16: 14:00:00 via INTRAVENOUS
  Filled 2019-03-16: qty 250

## 2019-03-16 NOTE — Progress Notes (Unsigned)
Patient here for iron infusion today.  She informed RN that she is having right leg swelling with foot pain for 2 weeks.  The documented dose in patient's chart is Eliquis 5mg  BID but she states she is taking Eliquis 2.5mg  bid.  Confirmed with patient's pharmacy that she is taking the Eliquis 2.5 mg bid and has has never had rx for Eliquis 5mg  filled there before.  She was scheduled for a STAT LE [place] (right) in [place].

## 2019-03-16 NOTE — Telephone Encounter (Signed)
Per Dr. Tasia Catchings secure chat, "US shows no impovement of DVT. Recommend her to increase Eliquis to 5mg  BID." I have called patient multiple times and no answer, I left voice message. I will try calling her again tomorrow.

## 2019-03-16 NOTE — Progress Notes (Signed)
Prior to infusion pt expressed to RN that her right leg has been swollen for two weeks now and that she has been taking her prescribed Eliquis. Upon assessment, pt.'s right leg has +3 edema and is significantly larger than her left leg. Pt is not complaining of any SOB, warmth, or leg pain, she is complaining of her right foot hurting. VSS and pt does not seem to be in any distress. MD notified, new orders placed, pt updated and verbalizes understanding.  @ 1454 Venofer infusion finished. Pt tolerated Venofer infusion well with no signs of reaction or complications. VSS prior to discharge.   Narek Kniss [employer]

## 2019-03-17 ENCOUNTER — Telehealth: Payer: Self-pay

## 2019-03-17 ENCOUNTER — Other Ambulatory Visit: Payer: Self-pay | Admitting: Oncology

## 2019-03-17 ENCOUNTER — Telehealth: Payer: Self-pay | Admitting: Oncology

## 2019-03-17 ENCOUNTER — Other Ambulatory Visit: Payer: Self-pay

## 2019-03-17 DIAGNOSIS — I82411 Acute embolism and thrombosis of right femoral vein: Secondary | ICD-10-CM

## 2019-03-17 MED ORDER — APIXABAN 5 MG PO TABS: 5.0000 mg | ORAL_TABLET | Freq: Two times a day (BID) | ORAL | 3 refills | Status: DC

## 2019-03-17 NOTE — Telephone Encounter (Signed)
I called patient and advised patient to increase Eliquis to 5 mg twice daily for for anticoagulation. New prescription was sent to [facility] and patient is aware. Right lower extremity increasing pain and swelling, I will refer patient to vascular surgery for further evaluation of need of thrombectomy. Patient has oncology appointment with [facility] this week and she plans to keep it.  I advised patient to discuss with [facility] oncology regarding chemotherapy.  I have recommended chemotherapy during our last visit and the patient wants to wait until she has a discussion with [facility] and then decide.

## 2019-03-17 NOTE — Telephone Encounter (Signed)
Spoke to patient today regarding the dosage increase in eliquis. Pt states she got one month free but cannont afford it. Per Bethena Roys (pharmacy) She has a copay of $177 of her deductible that she has not met and then will need to pay  $47  monthly (until january when the deductible starts over). Barnabas Lister notified to see if there was any financial assistance she can get. He states copay can be paid by the [facility]. Eliquis sent to [facility]. Patient notified.

## 2019-03-17 NOTE — Progress Notes (Signed)
Clarified with patient that she was advised by [facility] to take Elqiuis 2.5mg  BID. Her lower extremity swelling is getting worse. Repeat US shows DVT unchanged.  I sent her new Rx of Eliquis 5mg  BID for DVT.

## 2019-03-18 ENCOUNTER — Ambulatory Visit: Payer: Medicare Other | Admitting: Gastroenterology

## 2019-03-18 ENCOUNTER — Encounter: Payer: Self-pay | Admitting: Gastroenterology

## 2019-03-20 DIAGNOSIS — C679 Malignant neoplasm of bladder, unspecified: Secondary | ICD-10-CM | POA: Diagnosis not present

## 2019-03-20 DIAGNOSIS — C688 Malignant neoplasm of overlapping sites of urinary organs: Secondary | ICD-10-CM | POA: Diagnosis not present

## 2019-03-20 DIAGNOSIS — C772 Secondary and unspecified malignant neoplasm of intra-abdominal lymph nodes: Secondary | ICD-10-CM | POA: Diagnosis not present

## 2019-03-20 DIAGNOSIS — Z7189 Other specified counseling: Secondary | ICD-10-CM | POA: Diagnosis not present

## 2019-03-20 DIAGNOSIS — I82401 Acute embolism and thrombosis of unspecified deep veins of right lower extremity: Secondary | ICD-10-CM | POA: Diagnosis not present

## 2019-03-20 DIAGNOSIS — R5381 Other malaise: Secondary | ICD-10-CM | POA: Diagnosis not present

## 2019-03-20 DIAGNOSIS — N1330 Unspecified hydronephrosis: Secondary | ICD-10-CM | POA: Diagnosis not present

## 2019-03-20 DIAGNOSIS — Z86718 Personal history of other venous thrombosis and embolism: Secondary | ICD-10-CM | POA: Diagnosis not present

## 2019-03-20 DIAGNOSIS — C674 Malignant neoplasm of posterior wall of bladder: Secondary | ICD-10-CM | POA: Diagnosis not present

## 2019-03-20 DIAGNOSIS — Z23 Encounter for immunization: Secondary | ICD-10-CM | POA: Diagnosis not present

## 2019-03-23 ENCOUNTER — Inpatient Hospital Stay (HOSPITAL_BASED_OUTPATIENT_CLINIC_OR_DEPARTMENT_OTHER): Payer: Medicare Other | Admitting: Oncology

## 2019-03-23 ENCOUNTER — Other Ambulatory Visit (INDEPENDENT_AMBULATORY_CARE_PROVIDER_SITE_OTHER): Payer: Self-pay | Admitting: Vascular Surgery

## 2019-03-23 ENCOUNTER — Encounter: Payer: Self-pay | Admitting: Oncology

## 2019-03-23 ENCOUNTER — Other Ambulatory Visit: Payer: Self-pay

## 2019-03-23 ENCOUNTER — Inpatient Hospital Stay: Payer: Medicare Other

## 2019-03-23 VITALS — BP 122/69 | HR 85 | Resp 18

## 2019-03-23 VITALS — BP 131/76 | HR 94 | Temp 98.0°F | Resp 16 | Wt 190.7 lb

## 2019-03-23 DIAGNOSIS — I82411 Acute embolism and thrombosis of right femoral vein: Secondary | ICD-10-CM | POA: Diagnosis not present

## 2019-03-23 DIAGNOSIS — C679 Malignant neoplasm of bladder, unspecified: Secondary | ICD-10-CM

## 2019-03-23 DIAGNOSIS — Z7901 Long term (current) use of anticoagulants: Secondary | ICD-10-CM | POA: Diagnosis not present

## 2019-03-23 DIAGNOSIS — N179 Acute kidney failure, unspecified: Secondary | ICD-10-CM

## 2019-03-23 DIAGNOSIS — C7910 Secondary malignant neoplasm of unspecified urinary organs: Secondary | ICD-10-CM

## 2019-03-23 DIAGNOSIS — D500 Iron deficiency anemia secondary to blood loss (chronic): Secondary | ICD-10-CM

## 2019-03-23 DIAGNOSIS — E785 Hyperlipidemia, unspecified: Secondary | ICD-10-CM | POA: Diagnosis not present

## 2019-03-23 DIAGNOSIS — D509 Iron deficiency anemia, unspecified: Secondary | ICD-10-CM | POA: Diagnosis not present

## 2019-03-23 DIAGNOSIS — Z86718 Personal history of other venous thrombosis and embolism: Secondary | ICD-10-CM | POA: Diagnosis not present

## 2019-03-23 DIAGNOSIS — Z5112 Encounter for antineoplastic immunotherapy: Secondary | ICD-10-CM | POA: Diagnosis not present

## 2019-03-23 HISTORY — DX: Secondary malignant neoplasm of unspecified urinary organs: C79.10

## 2019-03-23 MED ORDER — SODIUM CHLORIDE 0.9 % IV SOLN
Freq: Once | INTRAVENOUS | Status: AC
  Administered 2019-03-23: 14:00:00 via INTRAVENOUS
  Filled 2019-03-23: qty 250

## 2019-03-23 MED ORDER — LIDOCAINE-PRILOCAINE 2.5-2.5 % EX CREA: TOPICAL_CREAM | CUTANEOUS | 3 refills | Status: DC

## 2019-03-23 MED ORDER — SODIUM CHLORIDE 0.9% FLUSH
10.0000 mL | Freq: Once | INTRAVENOUS | Status: AC | PRN
  Administered 2019-03-23: 10 mL
  Filled 2019-03-23: qty 10

## 2019-03-23 MED ORDER — HEPARIN SOD (PORK) LOCK FLUSH 100 UNIT/ML IV SOLN
500.0000 [IU] | Freq: Once | INTRAVENOUS | Status: AC | PRN
  Administered 2019-03-23: 500 [IU]
  Filled 2019-03-23: qty 5

## 2019-03-23 MED ORDER — SODIUM CHLORIDE 0.9 % IV SOLN: 200.0000 mg | Freq: Once | INTRAVENOUS | Status: DC

## 2019-03-23 MED ORDER — IRON SUCROSE 20 MG/ML IV SOLN
200.0000 mg | Freq: Once | INTRAVENOUS | Status: AC
  Administered 2019-03-23: 200 mg via INTRAVENOUS
  Filled 2019-03-23: qty 10

## 2019-03-23 NOTE — Progress Notes (Signed)
DISCONTINUE ON PATHWAY REGIMEN - Bladder     A cycle is every 14 days:     Methotrexate      Vinblastine      Doxorubicin      Cisplatin      [id number]-[id number]   **Always confirm dose/schedule in your pharmacy ordering system**  REASON: Disease Progression PRIOR TREATMENT: [id number]: Dose-Dense MVAC q14 Days x 3-4 Cycles TREATMENT RESPONSE: Progressive Disease (PD)  START ON PATHWAY REGIMEN - Bladder     A cycle is 21 days:     Pembrolizumab   **Always confirm dose/schedule in your pharmacy ordering system**  Patient Characteristics: Advanced/Metastatic Disease, First Line, Prior Platinum-Based Therapy, Relapse Within 12 Months, 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

## 2019-03-23 NOTE — Progress Notes (Signed)
Hematology/Oncology follow up note [facility] Telephone:([phone] [phone]   Patient Care Team: Volney American, PA-C as PCP - General (Family Medicine) Idelle Leech, OD (Optometry)  REFERRING PROVIDER: Dr.Sninsky CHIEF COMPLAINTS/REASON FOR VISIT:  Follow up for bladder cancer, anemia.   HISTORY OF PRESENTING ILLNESS:  Angela Russell is a  68 y.o.  female with PMH listed below who was referred to me for evaluation of newly diagnosed bladder cancer. Patient initially presented to emergency room at the end of January 2020 for evaluation of dysuria, hematuria and the left lower quadrant inguinal pain and flank pain.  1/28 2020 CT renal stone study showed suspected irregular wall thickening about the superior bladder, not well assessed due to degree of bladder distention.  Recommend cystoscopy for further evaluation.  No renal stone or obstructive uropathy.  Patient was given IV Rocephin and referred patient for outpatient urology follow-up.  Urine culture was negative.  She again presented to ER after 2 days with similar symptoms.  Patient has 25-pack-year smoking history, quit approximately 20 years ago.  No family history of any urology malignancies 07/03/2018 another CT abdomen pelvis with contrast was done which showed no nephrolithiasis or hydronephrosis is identified.  Bladder is decompressed limiting evaluation.  07/16/2018.urology Dr. [name] - cystoscopy and bilateral retrograde pyelogram on 07/16/2018.  Pyelogram did not show any filling defect or abnormalities.  No hydronephrosis.  Ureteral orifice was not involved with tumor.  There is a large 5 cm posterior wall bladder tumor, bullous and sessile appearing.  Patient underwent TURBT.   Pathology: High-grade urothelial carcinoma, invasive into muscularis propria.  Lymphovascular invasion is present.  Carcinoma in situ is also identified.  Focal squamous differentiation is noted, areas of  invasive carcinoma display pleomorphic/sarcomatoid changes.  T2b  08/07/2018 CT without contrast negative.  2 subpleural right upper lobe nodule 2 to 3 mm likely benign. She also had baseline audiometry done.  08/04/2018 ddMVAC x 1 cycle, stopped due to intolerance and AKI.  Patient received 1 cycle of dd MVAC, not able to tolerate due to AKI. Patient then was referred to [facility] urology  09/22/2018 patient underwent a cystectomy, pathology pT3a N0 Mx.  11 lymph nodes were harvested and was all negative. Invasive urothelia carcinoma, high grade, with sarcomatoid features.   10/14/2018 patient was admitted due to pyelonephritis and a pelvic fluid collection.  Drain was placed. 10/23/2018 drain was removed. Patient has had difficulties getting to her appointments to [facility]. 02/09/2019, patient presented with abdominal pain. CT concerning for small bowel obstruction and increased size of right pelvic fluid collection concerning for cancer recurrence.  Right hydronephrosis to the level of pelvis and enlarged lymph nodes. Patient had JP drain placed with CT guidance to pelvic fluid collection and drained 400 cc amber fluid.  Culture was negative for growth of microorganisms and cytology was negative for malignancy.-JP drain was removed on the day of discharge. CT-guided core biopsy of pelvic lymph node adenopathy was attempted but not successful. Patient was scheduled to have a PET scan done however she is severely claustrophobic and not able to get a PET scan done.  # Right lower extremity DVT, provoked by Transvenous biopsy  02/26/2019 transvenous biopsy by IR unsuccessful transvenous biopsy of the right pelvis mass. Post biopsy acute thrombus in the right external iliac and common femoral vein.  Patient was recommended to start anticoagulation with Xarelto however due to the co-pay, patient is not able to afford the medication. Anticoagulation regimen  was switched to Eliquis 5 mg.  Patient reports  that she has been taking it once a day.     INTERVAL HISTORY Angela Russell is a 68 y.o. female who has above history reviewed by me today presents for follow up visit for evaluation form metastatic high-grade urothelial carcinoma of the bladder. Sarcomatoid feature.   #During the interval, patient has reported increased right lower extremity swelling.  She was started on Eliquis for anticoagulation by [facility].  We clarified with her pharmacy and she was actually taking Eliquis 2.5 mg twice daily. Right lower extremity swelling has not improved but instead worsened. She had ultrasound right lower extremity done which showed persistent extensive proximal right lower extremity DVT. We have called patient and increased her anticoagulation dosage to 5 mg twice daily. She was also referred to vascular surgery to have an evaluation for any need of intervention. This DVT was provoked by her transvenous biopsy of pelvic mass.  #Anemia in CKD, patient has been started on IV Venofer treatments to further improve her iron stores. #Patient has establish care with [facility] oncology Dr. [name] for evaluation.  Dr. [name] recommended starting immunotherapy with PD-L1 inhibitor Pembrolizumab 200 mg every 3 weeks.  The sarcomatoid histology may not respond to chemotherapy well, could portend a better chance of response into immunotherapy  Patient prefers to do immunotherapy locally but wishes to continue follow-up with [facility] oncology as well. Dr. [name] recommended PET scan in 14 weeks.   Review of Systems  Constitutional: Positive for fatigue. Negative for appetite change, chills and fever.  HENT:   Negative for hearing loss and voice change.   Eyes: Negative for eye problems.  Respiratory: Negative for chest tightness and cough.   Cardiovascular: Positive for leg swelling. Negative for chest pain.  Gastrointestinal: Negative for abdominal distention, abdominal pain and blood in stool.  Endocrine:  Negative for hot flashes.  Genitourinary: Negative for difficulty urinating and frequency.   Musculoskeletal: Negative for arthralgias.  Skin: Negative for itching and rash.  Neurological: Negative for extremity weakness.  Hematological: Negative for adenopathy.  Psychiatric/Behavioral: Negative for confusion. The patient is not nervous/anxious.     MEDICAL HISTORY:  Past Medical History:  Diagnosis Date   Carotid artery plaque, right 01/2014   CKD (chronic kidney disease)    stage 2-3   Diabetes mellitus without complication (HCC)    DM (diabetes mellitus), type 2, uncontrolled (HCC)    Hyperlipidemia    Hypertension    Hypochromic microcytic anemia    mild   Osteoporosis    Renal insufficiency    Rotator cuff tendonitis, right     SURGICAL HISTORY: Past Surgical History:  Procedure Laterality Date   ABDOMINAL HYSTERECTOMY  2000   due to bleeding and fibroids, partial- still has ovaries   CYSTOSCOPY W/ RETROGRADES Bilateral 07/16/2018   Procedure: CYSTOSCOPY WITH RETROGRADE PYELOGRAM;  Surgeon: Billey Co, MD;  Location: [facility] ORS;  Service: Urology;  Laterality: Bilateral;   KNEE SURGERY Left 03/17/2013   torn meniscus   PORTA CATH INSERTION N/A 08/06/2018   Procedure: PORTA CATH INSERTION;  Surgeon: Algernon Huxley, MD;  Location: [facility];  Service: Cardiovascular;  Laterality: N/A;   TRANSURETHRAL RESECTION OF BLADDER TUMOR N/A 07/16/2018   Procedure: TRANSURETHRAL RESECTION OF BLADDER TUMOR (TURBT);  Surgeon: Billey Co, MD;  Location: [facility] ORS;  Service: Urology;  Laterality: N/A;    SOCIAL HISTORY: Social History   Socioeconomic History   Marital status: Single  Spouse name: Not on file   Number of children: 1   Years of education: Not on file   Highest education level: Not on file  Occupational History   Not on file  Social Needs   Financial resource strain: Not hard at all   Food insecurity    Worry: Never true      Inability: Never true   Transportation needs    Medical: No    Non-medical: No  Tobacco Use   Smoking status: Former Smoker    Quit date: 06/05/1991    Years since quitting: 27.8   Smokeless tobacco: Never Used  Substance and Sexual Activity   Alcohol use: No   Drug use: No   Sexual activity: Never  Lifestyle   Physical activity    Days per week: 2 days    Minutes per session: 30 min   Stress: Not at all  Relationships   Social connections    Talks on phone: More than three times a week    Gets together: More than three times a week    Attends religious service: More than 4 times per year    Active member of club or organization: Yes    Attends meetings of clubs or organizations: More than 4 times per year    Relationship status: Separated   Intimate partner violence    Fear of current or ex partner: No    Emotionally abused: No    Physically abused: No    Forced sexual activity: No  Other Topics Concern   Not on file  Social History Narrative   Working full time    FAMILY HISTORY: Family History  Problem Relation Age of Onset   Heart disease Mother    Heart attack Mother    Arthritis Father    Diabetes Brother     ALLERGIES:  has No Known Allergies.  MEDICATIONS:  Current Outpatient Medications  Medication Sig Dispense Refill   amLODipine (NORVASC) 5 MG tablet Take 1 tablet (5 mg total) by mouth daily. 90 tablet 1   apixaban (ELIQUIS) 5 MG TABS tablet Take 1 tablet (5 mg total) by mouth 2 (two) times daily. 60 tablet 3   diclofenac sodium (VOLTAREN) 1 % GEL Apply 2 g topically 4 (four) times daily. 100 g 2   polyethylene glycol (MIRALAX / GLYCOLAX) 17 g packet Take 17 g by mouth daily as needed for mild constipation. 14 each 0   pravastatin (PRAVACHOL) 10 MG tablet Take 1 tablet (10 mg total) by mouth at bedtime. 90 tablet 1   senna (SENOKOT) 8.6 MG TABS tablet Take 2 tablets (17.2 mg total) by mouth daily. 60 tablet 0   No current  facility-administered medications for this visit.      PHYSICAL EXAMINATION: ECOG PERFORMANCE STATUS: 2 - Symptomatic, <50% confined to bed Vitals:   03/23/19 1319  BP: 131/76  Pulse: 94  Resp: 16  Temp: 98 F (36.7 C)   Filed Weights   03/23/19 1319  Weight: 190 lb 11.2 oz (86.5 kg)    Physical Exam Constitutional:      General: She is not in acute distress.    Comments: Walk in today  HENT:     Head: Normocephalic and atraumatic.  Eyes:     General: No scleral icterus.    Pupils: Pupils are equal, round, and reactive to light.  Neck:     Musculoskeletal: Normal range of motion and neck supple.  Cardiovascular:     Rate  and Rhythm: Normal rate and regular rhythm.     Heart sounds: Normal heart sounds.  Pulmonary:     Effort: Pulmonary effort is normal. No respiratory distress.     Breath sounds: No wheezing.  Abdominal:     General: Bowel sounds are normal. There is no distension.     Palpations: Abdomen is soft. There is no mass.     Tenderness: There is no abdominal tenderness.  Musculoskeletal: Normal range of motion.        General: Swelling present. No deformity.     Comments: Right lower extremity 2+ edema  Skin:    General: Skin is warm and dry.     Coloration: Skin is pale.     Findings: No erythema or rash.  Neurological:     Mental Status: She is alert and oriented to person, place, and time.     Cranial Nerves: No cranial nerve deficit.     Coordination: Coordination normal.  Psychiatric:        Behavior: Behavior normal.        Thought Content: Thought content normal.     RADIOGRAPHIC STUDIES: I have personally reviewed the radiological images as listed and agreed with the findings in the report.  CMP Latest Ref Rng & Units 03/04/2019  Glucose 70 - 99 mg/dL 116(H)  BUN 8 - 23 mg/dL 26(H)  Creatinine 0.44 - 1.00 mg/dL 1.54(H)  Sodium 135 - 145 mmol/L 141  Potassium 3.5 - 5.1 mmol/L 4.6  Chloride 98 - 111 mmol/L 110  CO2 22 - 32 mmol/L 23    Calcium 8.9 - 10.3 mg/dL 9.4  Total Protein 6.5 - 8.1 g/dL 7.9  Total Bilirubin 0.3 - 1.2 mg/dL 0.3  Alkaline Phos 38 - 126 U/L 122  AST 15 - 41 U/L 27  ALT 0 - 44 U/L 17   CBC Latest Ref Rng & Units 03/04/2019  WBC 4.0 - 10.5 K/uL 6.3  Hemoglobin 12.0 - 15.0 g/dL 8.3(L)  Hematocrit 36.0 - 46.0 % 27.1(L)  Platelets 150 - 400 K/uL 297    LABORATORY DATA:  I have reviewed the data as listed Lab Results  Component Value Date   WBC 6.3 03/04/2019   HGB 8.3 (L) 03/04/2019   HCT 27.1 (L) 03/04/2019   MCV 78.8 (L) 03/04/2019   PLT 297 03/04/2019   Recent Labs    12/19/18 1841 12/20/18 0004 12/20/18 0514 12/21/18 0852 03/04/19 1423  NA 137 138 139 143 141  K 6.3* 5.7* 5.0 3.7 4.6  CL 115* 112* 111 101 110  CO2 15* 19* '22 31 23  '$ GLUCOSE 129* 87 106* 142* 116*  BUN 37* 33* 30* 21 26*  CREATININE 2.03* 1.80* 1.76* 1.77* 1.54*  CALCIUM 9.7 9.4 9.0 8.8* 9.4  GFRNONAA 24* 28* 29* 29* 34*  GFRAA 28* 33* 34* 33* 39*  PROT  --  6.7 6.3*  --  7.9  ALBUMIN  --  3.6 3.3*  --  3.7  AST  --  16 17  --  27  ALT  --  9 9  --  17  ALKPHOS  --  69 67  --  122  BILITOT 0.5 0.5 0.3  --  0.3  BILIDIR <0.1  --   --   --   --   IBILI NOT CALCULATED  --   --   --   --    Iron/TIBC/Ferritin/ %Sat    Component Value Date/Time   IRON 46 03/04/2019 1423  IRON 26 (L) 02/04/2019 1309   TIBC 267 03/04/2019 1423   TIBC 251 02/04/2019 1309   FERRITIN 316 (H) 03/04/2019 1423   FERRITIN 409 (H) 02/04/2019 1309   IRONPCTSAT 17 03/04/2019 1423   IRONPCTSAT 10 (L) 02/04/2019 1309    RADIOGRAPHIC STUDIES: I have personally reviewed the radiological images as listed and agreed with the findings in the report. US Venous Img Lower Unilateral Right  Result Date: 03/16/2019 CLINICAL DATA:  68 year old female with persistent right lower extremity swelling and a known right-sided DVT for the past 2 weeks. EXAM: RIGHT LOWER EXTREMITY VENOUS DOPPLER ULTRASOUND TECHNIQUE: Gray-scale sonography with  graded compression, as well as color Doppler and duplex ultrasound were performed to evaluate the lower extremity deep venous systems from the level of the common femoral vein and including the common femoral, femoral, profunda femoral, popliteal and calf veins including the posterior tibial, peroneal and gastrocnemius veins when visible. The superficial great saphenous vein was also interrogated. Spectral Doppler was utilized to evaluate flow at rest and with distal augmentation maneuvers in the common femoral, femoral and popliteal veins. COMPARISON:  Recent duplex venous ultrasound 03/06/2019 FINDINGS: Contralateral Common Femoral Vein: Respiratory phasicity is normal and symmetric with the symptomatic side. No evidence of thrombus. Normal compressibility. Common Femoral Vein: Similar appearance of the right common femoral vein which remains only partially compressible with internal eccentric filling defects and only partial flow on color Doppler imaging. The findings remain consistent with nonocclusive DVT. Saphenofemoral Junction: No evidence of thrombus. Normal compressibility and flow on color Doppler imaging. Profunda Femoral Vein: No evidence of thrombus. Normal compressibility and flow on color Doppler imaging. Femoral Vein: No evidence of thrombus. Normal compressibility, respiratory phasicity and response to augmentation. Popliteal Vein: No evidence of thrombus. Normal compressibility, respiratory phasicity and response to augmentation. Calf Veins: No evidence of thrombus. Normal compressibility and flow on color Doppler imaging. Superficial Great Saphenous Vein: No evidence of thrombus. Normal compressibility. Venous Reflux:  None. Other Findings:  None. IMPRESSION: Persistent and unchanged nonocclusive thrombus in the right common femoral vein. No significant interval improvement compared to 03/06/2019. Electronically Signed   By: Jacqulynn Cadet M.D.   On: 03/16/2019 16:11   US Venous Img Lower  Unilateral Right  Result Date: 03/06/2019 CLINICAL DATA:  Pain and swelling for 1 week EXAM: Right LOWER EXTREMITY VENOUS DOPPLER ULTRASOUND TECHNIQUE: Gray-scale sonography with graded compression, as well as color Doppler and duplex ultrasound were performed to evaluate the lower extremity deep venous systems from the level of the common femoral vein and including the common femoral, femoral, profunda femoral, popliteal and calf veins including the posterior tibial, peroneal and gastrocnemius veins when visible. The superficial great saphenous vein was also interrogated. Spectral Doppler was utilized to evaluate flow at rest and with distal augmentation maneuvers in the common femoral, femoral and popliteal veins. COMPARISON:  None. FINDINGS: Contralateral Common Femoral Vein: Respiratory phasicity is normal and symmetric with the symptomatic side. No evidence of thrombus. Normal compressibility. Common Femoral Vein: Nonocclusive thrombus within the right common femoral vein, extends into the right external iliac vein. Vessel is noncompressible. Saphenofemoral Junction: Thrombus within the common femoral vein extending to the saphenofemoral junction. Profunda Femoral Vein: No evidence of thrombus. Normal compressibility and flow on color Doppler imaging. Femoral Vein: No evidence of thrombus. Normal compressibility, respiratory phasicity and response to augmentation. Popliteal Vein: No evidence of thrombus. Normal compressibility, respiratory phasicity and response to augmentation. Calf Veins: No evidence of thrombus. Normal compressibility and flow on  color Doppler imaging. Other Findings:  Limited evaluation of the IVC shows patency. IMPRESSION: Positive for acute nonocclusive DVT extending from the right external iliac vein to the common femoral vein. Electronically Signed   By: Donavan Foil M.D.   On: 03/06/2019 21:03   Dg Knee Complete 4 Views Left  Result Date: 02/08/2019 CLINICAL DATA:  Knee pain  EXAM: LEFT KNEE - COMPLETE 4+ VIEW COMPARISON:  09/02/2012 FINDINGS: Moderate degenerative joint disease changes, most pronounced in the medial and patellofemoral compartments with joint space narrowing and spurring. No acute bony abnormality. Specifically, no fracture, subluxation, or dislocation. No joint effusion. IMPRESSION: Moderate degenerative changes.  No acute bony abnormality. Electronically Signed   By: Rolm Baptise M.D.   On: 02/08/2019 01:37   Patient had PET scan done 03/20/2019 which showed enhancing soft nodule in the cystectomy bed is worrisome for local recurrence. Right greater than left pelvic retroperitoneal and mesenteric lymphadenopathy consistent with nodal metastatic disease Deep vein thrombosis is seen in the right common iliac vein.  The upstream right external iliac vein is severely narrowed by right pelvic sidewall lymphadenopathy and postsurgical changes.  The right lower extremity appears enlarged and erythematous, likely due to venous insufficiency.  Loculated pelvic fluid collection appears smaller compared to February 09, 2019 scan.  ASSESSMENT & PLAN:  1. Metastatic urothelial carcinoma ([place])   2. Acute deep vein thrombosis (DVT) of femoral vein of right lower extremity (HCC)   Cancer Staging Bladder carcinoma [facility]) Staging form: Urinary Bladder, AJCC 8th Edition - Clinical stage from 08/01/2018: Stage II (cT2, cN0, cM0) - Signed by [name], MD on 08/03/2018  #Metastatic urothelial carcinoma of bladder, sarcomatoid features pelvic sidewall mass showed metastatic carcinoma consistent with involvement by urothelial carcinoma. [facility] oncology Dr. Ruthe Mannan notes were reviewed.  Dr. [name] recommend patient to be started on immunotherapy with pembrolizumab and the patient agrees with the treatment.  I discussed the mechanism of action and rationale of using immunotherapy.  The goal of therapy is palliative; and length of treatments are likely ongoing/based upon the  results of the scans. Discussed the potential side effects of immunotherapy including but not limited to fatigue, diarrhea; skin rash; respiratory failure, kidney failure, mental status change, elevated LFTs/liver failure,endocrine abnormalities, acute deterioration  and even death,etc. patient voices understanding and agrees with proceeding with treatment. Plan starting pembrolizumab 200 mg IV every 3 weeks.  We will see for insurance approval. Per Dr. Ruthe Mannan note, PD-L1 will be checked at [facility].  #Microcytic anemia, chronic.  MCV is chronically low. Iron panels are checked today.  Ferritin 316, iron saturation 17, TIBC 267.Not consistent with severe iron deficiency.  Normal vitamin B12 and folate level. Hemoglobinopathy showed normal adult hemoglobin. Anemia is most likely secondary to chronic kidney disease.  Patient has received IV iron to further improve her iron stores and hopefully increase her hemoglobin to be above 10.  I would not start her on erythropoietin treatment due to active cancer.  #Acute DVT, provoked by transvenous biopsy, also on recent PET scan, right external iliac vein is severely narrowed by right pelvic sidewall lymphadenopathy and postsurgical changes. Previously on Eliquis 2.5 mg prescribed by [facility].  Persistent symptoms and I increased the patient's Eliquis to 5 mg twice daily last week.  She continues to have right lower extremity swelling which I discussed with patient that it may take weeks for the symptoms to improve.  She also has an appointment with vascular surgery for evaluation of need of intervention.  Orders Placed This Encounter  Procedures   TSH    Standing Status:   Standing    Number of Occurrences:   20    Standing Expiration Date:   03/22/2020   CBC with Differential    Standing Status:   Standing    Number of Occurrences:   20    Standing Expiration Date:   03/23/2020   Comprehensive metabolic panel    Standing Status:   Standing     Number of Occurrences:   20    Standing Expiration Date:   03/23/2020    All questions were answered. The patient knows to call the clinic with any problems questions or concerns.  Return of visit: Evaluation prior to start with immunotherapy.  [name], MD, PhD Hematology Oncology [facility] Pager- [phone] 03/23/19

## 2019-03-23 NOTE — Progress Notes (Signed)
Patient reports continued right leg swelling and pain.

## 2019-03-24 ENCOUNTER — Telehealth: Payer: Self-pay

## 2019-03-24 NOTE — Telephone Encounter (Signed)
P/A for Emla cream submitted on [email or web address] (key code: [id number]).

## 2019-03-25 DIAGNOSIS — C772 Secondary and unspecified malignant neoplasm of intra-abdominal lymph nodes: Secondary | ICD-10-CM | POA: Diagnosis not present

## 2019-03-25 DIAGNOSIS — C674 Malignant neoplasm of posterior wall of bladder: Secondary | ICD-10-CM | POA: Diagnosis not present

## 2019-03-25 NOTE — Telephone Encounter (Signed)
Emla cream p/a approved for 3 months (allowed duration) from 03/23/2019 to 06/23/2018.   Ticket #[id number]

## 2019-03-27 ENCOUNTER — Ambulatory Visit (INDEPENDENT_AMBULATORY_CARE_PROVIDER_SITE_OTHER): Payer: Medicare Other

## 2019-03-27 ENCOUNTER — Other Ambulatory Visit
Admission: RE | Admit: 2019-03-27 | Discharge: 2019-03-27 | Disposition: A | Discharge status: Home / Self Care | Payer: Medicare Other | Source: Ambulatory Visit | Attending: Vascular Surgery | Admitting: Vascular Surgery

## 2019-03-27 ENCOUNTER — Telehealth (INDEPENDENT_AMBULATORY_CARE_PROVIDER_SITE_OTHER): Payer: Self-pay

## 2019-03-27 ENCOUNTER — Encounter (INDEPENDENT_AMBULATORY_CARE_PROVIDER_SITE_OTHER): Payer: Self-pay | Admitting: Vascular Surgery

## 2019-03-27 ENCOUNTER — Encounter (INDEPENDENT_AMBULATORY_CARE_PROVIDER_SITE_OTHER): Payer: Self-pay

## 2019-03-27 ENCOUNTER — Ambulatory Visit (INDEPENDENT_AMBULATORY_CARE_PROVIDER_SITE_OTHER): Payer: Medicare Other | Admitting: Vascular Surgery

## 2019-03-27 ENCOUNTER — Other Ambulatory Visit: Payer: Self-pay

## 2019-03-27 VITALS — BP 152/73 | HR 94 | Resp 16 | Ht 63.0 in | Wt 190.0 lb

## 2019-03-27 DIAGNOSIS — E1159 Type 2 diabetes mellitus with other circulatory complications: Secondary | ICD-10-CM

## 2019-03-27 DIAGNOSIS — D494 Neoplasm of unspecified behavior of bladder: Secondary | ICD-10-CM | POA: Diagnosis not present

## 2019-03-27 DIAGNOSIS — I82421 Acute embolism and thrombosis of right iliac vein: Secondary | ICD-10-CM | POA: Diagnosis not present

## 2019-03-27 DIAGNOSIS — C7910 Secondary malignant neoplasm of unspecified urinary organs: Secondary | ICD-10-CM | POA: Diagnosis not present

## 2019-03-27 DIAGNOSIS — E118 Type 2 diabetes mellitus with unspecified complications: Secondary | ICD-10-CM | POA: Diagnosis not present

## 2019-03-27 DIAGNOSIS — I10 Essential (primary) hypertension: Secondary | ICD-10-CM

## 2019-03-27 DIAGNOSIS — C679 Malignant neoplasm of bladder, unspecified: Secondary | ICD-10-CM

## 2019-03-27 DIAGNOSIS — I152 Hypertension secondary to endocrine disorders: Secondary | ICD-10-CM

## 2019-03-27 DIAGNOSIS — E782 Mixed hyperlipidemia: Secondary | ICD-10-CM

## 2019-03-27 DIAGNOSIS — Z20828 Contact with and (suspected) exposure to other viral communicable diseases: Secondary | ICD-10-CM | POA: Diagnosis not present

## 2019-03-27 DIAGNOSIS — I82409 Acute embolism and thrombosis of unspecified deep veins of unspecified lower extremity: Secondary | ICD-10-CM | POA: Insufficient documentation

## 2019-03-27 DIAGNOSIS — Z01812 Encounter for preprocedural laboratory examination: Secondary | ICD-10-CM | POA: Diagnosis not present

## 2019-03-27 DIAGNOSIS — I82411 Acute embolism and thrombosis of right femoral vein: Secondary | ICD-10-CM

## 2019-03-27 LAB — SARS CORONAVIRUS 2 (TAT 6-24 HRS): SARS Coronavirus 2: NEGATIVE

## 2019-03-27 NOTE — Assessment & Plan Note (Signed)
lipid control important in reducing the progression of atherosclerotic disease. Continue statin therapy  

## 2019-03-27 NOTE — Telephone Encounter (Signed)
Patient was seen today in office and is now scheduled for a procedure with Dr. Lucky Cowboy on 03/30/2019 with 11:45 am arrival time to the [facility]. Patient will do Covid testing today 03/27/2019 between 12:30-2:30 pm at the [facility]. Pre-procedure instructions were discussed and given to the patient.

## 2019-03-27 NOTE — Assessment & Plan Note (Signed)
blood glucose control important in reducing the progression of atherosclerotic disease. Also, involved in wound healing. On appropriate medications.  

## 2019-03-27 NOTE — Assessment & Plan Note (Signed)
Increased clotting risk.  Possible recurrence.

## 2019-03-27 NOTE — Patient Instructions (Signed)

## 2019-03-27 NOTE — Assessment & Plan Note (Signed)
blood pressure control important in reducing the progression of atherosclerotic disease. On appropriate oral medications.  

## 2019-03-27 NOTE — Progress Notes (Signed)
Patient ID: Angela Russell, female   DOB: [birth year], 68 y.o.   MRN: [id number]  Chief Complaint  Patient presents with   New Patient (Initial Visit)    ref Angela Russell for RLE dvt    HPI Angela Russell is a 68 y.o. female.  I am asked to see the patient by Dr. Tasia Russell for evaluation of extensive RLE DVT.  I put her port in earlier this year but had not seen her in the office.  About 3 to 4 weeks ago, she underwent some sort of transvenous biopsy by interventional radiology to try to biopsy a recurrent right pelvis mass.  She has a known history of bladder/urothelial cancer and there was concern for recurrence.  Not only was the biopsy unsuccessful, she developed a massively swollen right leg and a DVT following the biopsy.  I have no idea why they went a transvenous route, but clearly this was the cause of her DVT.  Following this, she was prescribed Xarelto but did not take it due to the co-pay.  Not surprisingly her leg swelling continued to worsen.  She was then started on Eliquis at a dose of 2.5 mg a day per her report although that has now been switched to 5 mg twice daily.  Her right leg remains massively swollen and painful.  It is almost twice the size of the left leg.  No chest pain or shortness of breath.  No previous history of DVT to her knowledge   Past Medical History:  Diagnosis Date   Carotid artery plaque, right 01/2014   CKD (chronic kidney disease)    stage 2-3   Diabetes mellitus without complication (HCC)    DM (diabetes mellitus), type 2, uncontrolled (HCC)    Hyperlipidemia    Hypertension    Hypochromic microcytic anemia    mild   Metastatic urothelial carcinoma ([place]) 03/23/2019   Osteoporosis    Renal insufficiency    Rotator cuff tendonitis, right     Past Surgical History:  Procedure Laterality Date   ABDOMINAL HYSTERECTOMY  2000   due to bleeding and fibroids, partial- still has ovaries   CYSTOSCOPY W/ RETROGRADES Bilateral 07/16/2018   Procedure: CYSTOSCOPY WITH RETROGRADE PYELOGRAM;  Surgeon: Billey Co, MD;  Location: [facility];  Service: Urology;  Laterality: Bilateral;   KNEE SURGERY Left 03/17/2013   torn meniscus   PORTA CATH INSERTION N/A 08/06/2018   Procedure: PORTA CATH INSERTION;  Surgeon: Algernon Huxley, MD;  Location: [facility];  Service: Cardiovascular;  Laterality: N/A;   TRANSURETHRAL RESECTION OF BLADDER TUMOR N/A 07/16/2018   Procedure: TRANSURETHRAL RESECTION OF BLADDER TUMOR (TURBT);  Surgeon: Billey Co, MD;  Location: [facility];  Service: Urology;  Laterality: N/A;    Family History Family History  Problem Relation Age of Onset   Heart disease Mother    Heart attack Mother    Arthritis Father    Diabetes Brother   no bleeding or clotting disorders  Social History Social History   Tobacco Use   Smoking status: Former Smoker    Quit date: 06/05/1991    Years since quitting: 27.8   Smokeless tobacco: Never Used  Substance Use Topics   Alcohol use: No   Drug use: No    No Known Allergies  Current Outpatient Medications  Medication Sig Dispense Refill   amLODipine (NORVASC) 5 MG tablet Take 1 tablet (5 mg total) by mouth daily. 90 tablet 1   apixaban (ELIQUIS)  5 MG TABS tablet Take 1 tablet (5 mg total) by mouth 2 (two) times daily. 60 tablet 3   diclofenac sodium (VOLTAREN) 1 % GEL Apply 2 g topically 4 (four) times daily. 100 g 2   lidocaine-prilocaine (EMLA) cream Apply to affected area once 30 g 3   polyethylene glycol (MIRALAX / GLYCOLAX) 17 g packet Take 17 g by mouth daily as needed for mild constipation. 14 each 0   pravastatin (PRAVACHOL) 10 MG tablet Take 1 tablet (10 mg total) by mouth at bedtime. 90 tablet 1   senna (SENOKOT) 8.6 MG TABS tablet Take 2 tablets (17.2 mg total) by mouth daily. (Patient not taking: Reported on 03/27/2019) 60 tablet 0   No current facility-administered medications for this visit.       REVIEW OF SYSTEMS  (Negative unless checked)  Constitutional: _0 Weight loss  _1 Fever  _2 Chills Cardiac: _3 Chest pain   _4 Chest pressure   _5 Palpitations   _6 Shortness of breath when laying flat   _7 Shortness of breath at rest   _8 Shortness of breath with exertion. Vascular:  _9 Pain in legs with walking   _10 Pain in legs at rest   _11 Pain in legs when laying flat   _12 Claudication   _13 Pain in feet when walking  _14 Pain in feet at rest  _15 Pain in feet when laying flat   _16 History of DVT   _17 Phlebitis   _18 Swelling in legs   _19 Varicose veins   _20 Non-healing ulcers Pulmonary:   _21 Uses home oxygen   _22 Productive cough   _23 Hemoptysis   _24 Wheeze  _25 COPD   _26 Asthma Neurologic:  _27 Dizziness  _28 Blackouts   _29 Seizures   _30 History of stroke   _31 History of TIA  _32 Aphasia   _33 Temporary blindness   _34 Dysphagia   _35 Weakness or numbness in arms   _36 Weakness or numbness in legs Musculoskeletal:  _37 Arthritis   _38 Joint swelling   _39 Joint pain   _40 Low back pain Hematologic:  _41 Easy bruising  _42 Easy bleeding   _43 Hypercoagulable state   _44 Anemic  _45 Hepatitis Gastrointestinal:  _46 Blood in stool   _47 Vomiting blood  _48 Gastroesophageal reflux/heartburn   _49 Abdominal pain Genitourinary:  _50 Chronic kidney disease   _51 Difficult urination  _52 Frequent urination  _53 Burning with urination   _54 Hematuria Skin:  _55 Rashes   _56 Ulcers   _57 Wounds Psychological:  _58 History of anxiety   _59  History of major depression.    Physical Exam BP (!) 152/73 (BP Location: Right Arm)    Pulse 94    Resp 16    Ht _60  (1.6 m)    Wt 190 lb (86.2 kg)    BMI 33.66 kg/m  Gen:  WD/WN, NAD Head: [place]/AT, No temporalis wasting.  Ear/Nose/Throat: Hearing grossly intact, nares w/o erythema or drainage, oropharynx w/o Erythema/Exudate Eyes: Conjunctiva clear, sclera non-icteric  Neck: trachea midline.  No JVD.  Pulmonary:  Good air movement, respirations not labored, no use of accessory muscles  Cardiac: RRR, no JVD Vascular:  Vessel Right Left  Radial Palpable  Palpable                          DP  trace  1+  PT  not palpable  1+   Gastrointestinal:. No masses, surgical incisions, or scars. Musculoskeletal: M/S 5/5 throughout.  Extremities without ischemic changes.  No deformity or atrophy.  4+ right lower extremity edema. Neurologic: Sensation grossly intact in extremities.  Symmetrical.  Speech is fluent. Motor exam as listed above. Psychiatric: Judgment intact, Mood & affect appropriate for pt's clinical  situation. Dermatologic: No rashes or ulcers noted.  No cellulitis or open wounds.    Radiology US Venous Img Lower Unilateral Right  Result Date: 03/16/2019 CLINICAL DATA:  68 year old female with persistent right lower extremity swelling and a known right-sided DVT for the past 2 weeks. EXAM: RIGHT LOWER EXTREMITY VENOUS DOPPLER ULTRASOUND TECHNIQUE: Gray-scale sonography with graded compression, as well as color Doppler and duplex ultrasound were performed to evaluate the lower extremity deep venous systems from the level of the common femoral vein and including the common femoral, femoral, profunda femoral, popliteal and calf veins including the posterior tibial, peroneal and gastrocnemius veins when visible. The superficial great saphenous vein was also interrogated. Spectral Doppler was utilized to evaluate flow at rest and with distal augmentation maneuvers in the common femoral, femoral and popliteal veins. COMPARISON:  Recent duplex venous ultrasound 03/06/2019 FINDINGS: Contralateral Common Femoral Vein: Respiratory phasicity is normal and symmetric with the symptomatic side. No evidence of thrombus. Normal compressibility. Common Femoral Vein: Similar appearance of the right common femoral vein which remains only partially compressible with internal eccentric filling defects and only partial flow on color Doppler imaging. The findings remain consistent with nonocclusive DVT. Saphenofemoral Junction: No evidence of thrombus. Normal  compressibility and flow on color Doppler imaging. Profunda Femoral Vein: No evidence of thrombus. Normal compressibility and flow on color Doppler imaging. Femoral Vein: No evidence of thrombus. Normal compressibility, respiratory phasicity and response to augmentation. Popliteal Vein: No evidence of thrombus. Normal compressibility, respiratory phasicity and response to augmentation. Calf Veins: No evidence of thrombus. Normal compressibility and flow on color Doppler imaging. Superficial Great Saphenous Vein: No evidence of thrombus. Normal compressibility. Venous Reflux:  None. Other Findings:  None. IMPRESSION: Persistent and unchanged nonocclusive thrombus in the right common femoral vein. No significant interval improvement compared to 03/06/2019. Electronically Signed   By: Jacqulynn Cadet M.D.   On: 03/16/2019 16:11   US Venous Img Lower Unilateral Right  Result Date: 03/06/2019 CLINICAL DATA:  Pain and swelling for 1 week EXAM: Right LOWER EXTREMITY VENOUS DOPPLER ULTRASOUND TECHNIQUE: Gray-scale sonography with graded compression, as well as color Doppler and duplex ultrasound were performed to evaluate the lower extremity deep venous systems from the level of the common femoral vein and including the common femoral, femoral, profunda femoral, popliteal and calf veins including the posterior tibial, peroneal and gastrocnemius veins when visible. The superficial great saphenous vein was also interrogated. Spectral Doppler was utilized to evaluate flow at rest and with distal augmentation maneuvers in the common femoral, femoral and popliteal veins. COMPARISON:  None. FINDINGS: Contralateral Common Femoral Vein: Respiratory phasicity is normal and symmetric with the symptomatic side. No evidence of thrombus. Normal compressibility. Common Femoral Vein: Nonocclusive thrombus within the right common femoral vein, extends into the right external iliac vein. Vessel is noncompressible. Saphenofemoral  Junction: Thrombus within the common femoral vein extending to the saphenofemoral junction. Profunda Femoral Vein: No evidence of thrombus. Normal compressibility and flow on color Doppler imaging. Femoral Vein: No evidence of thrombus. Normal compressibility, respiratory phasicity and response to augmentation. Popliteal Vein: No evidence of thrombus. Normal compressibility, respiratory phasicity and response to augmentation. Calf Veins: No evidence of thrombus. Normal compressibility and flow on color Doppler imaging. Other Findings:  Limited evaluation of the IVC shows patency. IMPRESSION: Positive for acute nonocclusive DVT extending from the right external iliac vein to the common femoral vein. Electronically Signed   By: Donavan Foil M.D.   On: 03/06/2019 21:03    Labs  Recent Results (from the past 2160 hour(s))  WET PREP FOR TRICH, YEAST, CLUE     Status: None   Collection Time: 01/14/19  3:59 PM   Specimen: Vaginal Fluid   VAGINAL FLUI  Result Value Ref Range   Trichomonas Exam Negative Negative   Yeast Exam Negative Negative   Clue Cell Exam Negative Negative  Iron, TIBC and Ferritin Panel     Status: Abnormal   Collection Time: 02/04/19  1:09 PM  Result Value Ref Range   Total Iron Binding Capacity 251 250 - 450 ug/dL   UIBC 225 118 - 369 ug/dL   Iron 26 (L) 27 - 139 ug/dL   Iron Saturation 10 (L) 15 - 55 %   Ferritin 409 (H) 15 - 150 ng/mL  Urinalysis     Status: Abnormal   Collection Time: 02/04/19  1:09 PM  Result Value Ref Range   Specific Gravity, UA 1.015 1.005 - 1.030   pH, UA >=9.0 (A) 5.0 - 7.5   Color, UA Yellow Yellow   Appearance Ur Cloudy (A) Clear   Leukocytes,UA 3+ (A) Negative   Protein,UA 2+ (A) Negative/Trace   Glucose, UA 1+ (A) Negative   Ketones, UA Negative Negative   RBC, UA Negative Negative   Bilirubin, UA Negative Negative   Urobilinogen, Ur 0.2 0.2 - 1.0 mg/dL   Nitrite, UA Negative Negative  Celiac Disease Ab Screen w/Rfx     Status: None    Collection Time: 02/04/19  1:09 PM  Result Value Ref Range   Antigliadin Abs, IgA 5 0 - 19 units    Comment:                    Negative                   0 - 19                    Weak Positive             20 - 30                    Moderate to Strong Positive   >30    Transglutaminase IgA <2 0 - 3 U/mL    Comment:                               Negative        0 -  3                               Weak Positive   4 - 10                               Positive           >10  Tissue Transglutaminase (tTG) has been identified  as the endomysial antigen.  Studies have demonstr-  ated that endomysial IgA antibodies have over 99%  specificity for gluten sensitive enteropathy.    IgA/Immunoglobulin A, Serum 187 87 - 352 mg/dL  B12 and Folate Panel     Status: None   Collection Time: 02/04/19  1:09 PM  Result Value Ref Range   Vitamin B-12 310 232 - 1,245 pg/mL   Folate 10.4 >3.0 ng/mL    Comment:  A serum folate concentration of less than 3.1 ng/mL is considered to represent clinical deficiency.   H. pylori breath test     Status: Abnormal   Collection Time: 02/04/19  1:09 PM  Result Value Ref Range   H pylori Breath Test Positive (A) Negative  Ferritin     Status: Abnormal   Collection Time: 03/04/19  2:23 PM  Result Value Ref Range   Ferritin 316 (H) 11 - 307 ng/mL    Comment: Performed at [facility], [address] [place], [place] [address]  Iron and TIBC     Status: None   Collection Time: 03/04/19  2:23 PM  Result Value Ref Range   Iron 46 28 - 170 ug/dL   TIBC 267 250 - 450 ug/dL   Saturation Ratios 17 10.4 - 31.8 %   UIBC 221 ug/dL    Comment: Performed at [facility], [address] [place], [place] [address]  Retic Panel     Status: Abnormal   Collection Time: 03/04/19  2:23 PM  Result Value Ref Range   Retic Ct Pct 1.2 0.4 - 3.1 %   RBC. 3.44 (L) 3.87 - 5.11 MIL/uL   Retic Count, Absolute 40.6 19.0 - 186.0 K/uL   Immature Retic Fract 5.7 2.3  - 15.9 %   Reticulocyte Hemoglobin 25.7 (L) >27.9 pg    Comment:        A RET-He < 28 pg is an indication of iron-deficient or iron- insufficient erythropoiesis. Patients with thalassemia may also have a decreased RET-He result unrelated to iron availability.     If this patient has chronic kidney disease and does not have a hemoglobinopathy he/she meets criteria for iron deficiency per the 2016 NICE guidelines. Refer to specific guidelines to determine the appropriate thresholds for treating CKD- associated iron deficiency. TSAT and ferritin should be used in patients with hemoglobinopathies (e.g. thalassemia). Performed at [facility], [address] [place], [place] [address]   Hemoglobinopathy evaluation     Status: None   Collection Time: 03/04/19  2:23 PM  Result Value Ref Range   Hgb A2 Quant 2.0 1.8 - 3.2 %   Hgb F Quant 0.0 0.0 - 2.0 %   Hgb S Quant 0.0 0.0 %   Hgb C 0.0 0.0 %   Hgb A 98.0 96.4 - 98.8 %   Hgb Variant 0.0 0.0 %   Please Note: Comment     Comment: (NOTE) Normal adult hemoglobin present. Performed At: [facility] [place], [place] [id number] Rush Farmer MD [id number]   Comprehensive metabolic panel     Status: Abnormal   Collection Time: 03/04/19  2:23 PM  Result Value Ref Range   Sodium 141 135 - 145 mmol/L   Potassium 4.6 3.5 - 5.1 mmol/L   Chloride 110 98 - 111 mmol/L   CO2 23 22 - 32 mmol/L   Glucose, Bld 116 (H) 70 - 99 mg/dL   BUN 26 (H) 8 - 23 mg/dL   Creatinine, Ser 1.54 (H) 0.44 - 1.00 mg/dL   Calcium 9.4 8.9 - 10.3 mg/dL   Total Protein 7.9 6.5 - 8.1 g/dL   Albumin 3.7 3.5 - 5.0 g/dL   AST 27 15 - 41 U/L   ALT 17 0 - 44 U/L   Alkaline Phosphatase 122 38 - 126 U/L   Total Bilirubin 0.3 0.3 - 1.2 mg/dL   GFR calc non Af Amer 34 (L) >60 mL/min  GFR calc Af Amer 39 (L) >60 mL/min   Anion gap 8 5 - 15    Comment: Performed at [facility], [address] [place], [place] [address]    CBC with Differential/Platelet     Status: Abnormal   Collection Time: 03/04/19  2:23 PM  Result Value Ref Range   WBC 6.3 4.0 - 10.5 K/uL   RBC 3.44 (L) 3.87 - 5.11 MIL/uL   Hemoglobin 8.3 (L) 12.0 - 15.0 g/dL   HCT 27.1 (L) 36.0 - 46.0 %   MCV 78.8 (L) 80.0 - 100.0 fL   MCH 24.1 (L) 26.0 - 34.0 pg   MCHC 30.6 30.0 - 36.0 g/dL   RDW 16.2 (H) 11.5 - 15.5 %   Platelets 297 150 - 400 K/uL   nRBC 0.0 0.0 - 0.2 %   Neutrophils Relative % 73 %   Neutro Abs 4.6 1.7 - 7.7 K/uL   Lymphocytes Relative 16 %   Lymphs Abs 1.0 0.7 - 4.0 K/uL   Monocytes Relative 8 %   Monocytes Absolute 0.5 0.1 - 1.0 K/uL   Eosinophils Relative 2 %   Eosinophils Absolute 0.1 0.0 - 0.5 K/uL   Basophils Relative 1 %   Basophils Absolute 0.0 0.0 - 0.1 K/uL   Immature Granulocytes 0 %   Abs Immature Granulocytes 0.02 0.00 - 0.07 K/uL    Comment: Performed at [facility], [address] [place], [place] [address]    Assessment/Plan:  Controlled diabetes mellitus type 2 with complications ([place]) blood glucose control important in reducing the progression of atherosclerotic disease. Also, involved in wound healing. On appropriate medications.   Hypertension associated with diabetes ([place]) blood pressure control important in reducing the progression of atherosclerotic disease. On appropriate oral medications.   Hyperlipidemia lipid control important in reducing the progression of atherosclerotic disease. Continue statin therapy   Bladder carcinoma (HCC) Increased clotting risk.  Possible recurrence.  DVT (deep venous thrombosis) (HCC) At this point, the patient has a massively swollen right leg from iliac vein DVT after an unsuccessful biopsy of a pelvic mass.  This is a very severe problem.  Although she does not have true phlegmasia, her leg is markedly swollen.  Even though it has been 3 to 4 weeks since her symptoms started, and her femoral component of her DVT appears better on anticoagulation,  her pelvic vein obstruction is still causing marked symptoms.  I would recommend a venogram with thrombectomy and likely angioplasty plus or minus stenting of the right iliac vein.  She will need to be on anticoagulation for at least a year after this.  We will also place an IVC filter at that time.  Femoral access should be an option so she would be able to be in the supine position.  I have discussed the long-term effects of the DVT with or without treatment.  I would hopefully reduce her long-term pain and swelling by performing intervention.  We are getting her on the schedule and she is agreeable to proceed.      Leotis Pain 03/27/2019, 9:58 AM   This note was created with Dragon medical transcription system.  Any errors from dictation are unintentional.

## 2019-03-27 NOTE — Assessment & Plan Note (Signed)
At this point, the patient has a massively swollen right leg from iliac vein DVT after an unsuccessful biopsy of a pelvic mass.  This is a very severe problem.  Although she does not have true phlegmasia, her leg is markedly swollen.  Even though it has been 3 to 4 weeks since her symptoms started, and her femoral component of her DVT appears better on anticoagulation, her pelvic vein obstruction is still causing marked symptoms.  I would recommend a venogram with thrombectomy and likely angioplasty plus or minus stenting of the right iliac vein.  She will need to be on anticoagulation for at least a year after this.  We will also place an IVC filter at that time.  Femoral access should be an option so she would be able to be in the supine position.  I have discussed the long-term effects of the DVT with or without treatment.  I would hopefully reduce her long-term pain and swelling by performing intervention.  We are getting her on the schedule and she is agreeable to proceed.

## 2019-03-29 ENCOUNTER — Other Ambulatory Visit (INDEPENDENT_AMBULATORY_CARE_PROVIDER_SITE_OTHER): Payer: Self-pay | Admitting: Nurse Practitioner

## 2019-03-29 MED ORDER — CEFAZOLIN SODIUM-DEXTROSE 2-4 GM/100ML-% IV SOLN
2.0000 g | Freq: Once | INTRAVENOUS | Status: AC
  Administered 2019-03-30: 13:00:00 2 g via INTRAVENOUS

## 2019-03-30 ENCOUNTER — Ambulatory Visit
Admission: RE | Admit: 2019-03-30 | Discharge: 2019-03-30 | Disposition: A | Discharge status: Home / Self Care | Payer: Medicare Other | Attending: Vascular Surgery | Admitting: Vascular Surgery

## 2019-03-30 ENCOUNTER — Other Ambulatory Visit: Payer: Self-pay

## 2019-03-30 ENCOUNTER — Inpatient Hospital Stay: Payer: Medicare Other

## 2019-03-30 ENCOUNTER — Encounter
Admission: RE | Disposition: A | Discharge status: Home / Self Care | Payer: Self-pay | Source: Home / Self Care | Attending: Vascular Surgery

## 2019-03-30 DIAGNOSIS — Z7901 Long term (current) use of anticoagulants: Secondary | ICD-10-CM | POA: Diagnosis not present

## 2019-03-30 DIAGNOSIS — I120 Hypertensive chronic kidney disease with stage 5 chronic kidney disease or end stage renal disease: Secondary | ICD-10-CM | POA: Insufficient documentation

## 2019-03-30 DIAGNOSIS — E1122 Type 2 diabetes mellitus with diabetic chronic kidney disease: Secondary | ICD-10-CM | POA: Diagnosis not present

## 2019-03-30 DIAGNOSIS — C7911 Secondary malignant neoplasm of bladder: Secondary | ICD-10-CM | POA: Insufficient documentation

## 2019-03-30 DIAGNOSIS — I82421 Acute embolism and thrombosis of right iliac vein: Secondary | ICD-10-CM | POA: Diagnosis not present

## 2019-03-30 DIAGNOSIS — N182 Chronic kidney disease, stage 2 (mild): Secondary | ICD-10-CM | POA: Diagnosis not present

## 2019-03-30 DIAGNOSIS — Z79899 Other long term (current) drug therapy: Secondary | ICD-10-CM | POA: Insufficient documentation

## 2019-03-30 DIAGNOSIS — E785 Hyperlipidemia, unspecified: Secondary | ICD-10-CM | POA: Insufficient documentation

## 2019-03-30 DIAGNOSIS — Z87891 Personal history of nicotine dependence: Secondary | ICD-10-CM | POA: Diagnosis not present

## 2019-03-30 DIAGNOSIS — I82401 Acute embolism and thrombosis of unspecified deep veins of right lower extremity: Secondary | ICD-10-CM

## 2019-03-30 DIAGNOSIS — I6521 Occlusion and stenosis of right carotid artery: Secondary | ICD-10-CM | POA: Diagnosis not present

## 2019-03-30 DIAGNOSIS — I82501 Chronic embolism and thrombosis of unspecified deep veins of right lower extremity: Secondary | ICD-10-CM | POA: Diagnosis not present

## 2019-03-30 HISTORY — PX: PERIPHERAL VASCULAR THROMBECTOMY: CATH118306

## 2019-03-30 HISTORY — PX: IVC FILTER INSERTION: CATH118245

## 2019-03-30 LAB — BUN: BUN: 31 mg/dL — ABNORMAL HIGH (ref 8–23)

## 2019-03-30 LAB — CREATININE, SERUM
Creatinine, Ser: 1.4 mg/dL — ABNORMAL HIGH (ref 0.44–1.00)
GFR calc Af Amer: 44 mL/min — ABNORMAL LOW (ref >60)
GFR calc non Af Amer: 38 mL/min — ABNORMAL LOW (ref >60)

## 2019-03-30 SURGERY — PERIPHERAL VASCULAR THROMBECTOMY
Anesthesia: Moderate Sedation | Site: Leg Lower | Laterality: Right

## 2019-03-30 MED ORDER — HEPARIN SODIUM (PORCINE) 1000 UNIT/ML IJ SOLN
INTRAMUSCULAR | Status: AC
  Filled 2019-03-30: qty 1

## 2019-03-30 MED ORDER — IODIXANOL 320 MG/ML IV SOLN
INTRAVENOUS | Status: DC | PRN
  Administered 2019-03-30: 14:00:00 55 mL via INTRA_ARTERIAL

## 2019-03-30 MED ORDER — ONDANSETRON HCL 4 MG/2ML IJ SOLN: 4.0000 mg | Freq: Four times a day (QID) | INTRAMUSCULAR | Status: DC | PRN

## 2019-03-30 MED ORDER — MIDAZOLAM HCL 2 MG/ML PO SYRP: 8.0000 mg | ORAL_SOLUTION | Freq: Once | ORAL | Status: DC | PRN

## 2019-03-30 MED ORDER — HEPARIN SODIUM (PORCINE) 1000 UNIT/ML IJ SOLN
INTRAMUSCULAR | Status: DC | PRN
  Administered 2019-03-30: 4000 [IU] via INTRAVENOUS

## 2019-03-30 MED ORDER — METHYLPREDNISOLONE SODIUM SUCC 125 MG IJ SOLR: 125.0000 mg | Freq: Once | INTRAMUSCULAR | Status: DC | PRN

## 2019-03-30 MED ORDER — FENTANYL CITRATE (PF) 100 MCG/2ML IJ SOLN
INTRAMUSCULAR | Status: AC
  Filled 2019-03-30: qty 2

## 2019-03-30 MED ORDER — DIPHENHYDRAMINE HCL 50 MG/ML IJ SOLN: 50.0000 mg | Freq: Once | INTRAMUSCULAR | Status: DC | PRN

## 2019-03-30 MED ORDER — HYDROMORPHONE HCL 1 MG/ML IJ SOLN: 1.0000 mg | Freq: Once | INTRAMUSCULAR | Status: DC | PRN

## 2019-03-30 MED ORDER — SODIUM CHLORIDE 0.9 % IV SOLN
INTRAVENOUS | Status: DC
  Administered 2019-03-30: 1000 mL via INTRAVENOUS

## 2019-03-30 MED ORDER — MIDAZOLAM HCL 5 MG/5ML IJ SOLN
INTRAMUSCULAR | Status: AC
  Filled 2019-03-30: qty 5

## 2019-03-30 MED ORDER — MIDAZOLAM HCL 2 MG/2ML IJ SOLN
INTRAMUSCULAR | Status: DC | PRN
  Administered 2019-03-30 (×2): 1 mg via INTRAVENOUS
  Administered 2019-03-30: 2 mg via INTRAVENOUS

## 2019-03-30 MED ORDER — FENTANYL CITRATE (PF) 100 MCG/2ML IJ SOLN
INTRAMUSCULAR | Status: DC | PRN
  Administered 2019-03-30 (×2): 25 ug via INTRAVENOUS
  Administered 2019-03-30: 50 ug via INTRAVENOUS

## 2019-03-30 MED ORDER — FAMOTIDINE 20 MG PO TABS: 40.0000 mg | ORAL_TABLET | Freq: Once | ORAL | Status: DC | PRN

## 2019-03-30 MED ORDER — CEFAZOLIN SODIUM-DEXTROSE 2-4 GM/100ML-% IV SOLN
INTRAVENOUS | Status: AC
  Administered 2019-03-30: 2 g via INTRAVENOUS
  Filled 2019-03-30: qty 100

## 2019-03-30 SURGICAL SUPPLY — 20 items
BALLN DORADO 10X80X80 (BALLOONS) ×3
BALLN ULTRVRSE 12X80X75 (BALLOONS) ×3
BALLOON DORADO 10X80X80 (BALLOONS) ×2 IMPLANT
BALLOON ULTRVRSE 12X80X75 (BALLOONS) ×2 IMPLANT
CANISTER PENUMBRA ENGINE (MISCELLANEOUS) ×3 IMPLANT
CANNULA 5F STIFF (CANNULA) ×3 IMPLANT
CATH BEACON 5 .035 40 KMP TP (CATHETERS) ×2 IMPLANT
CATH BEACON 5 .038 40 KMP TP (CATHETERS) ×1
CATH INDIGO D 50CM (CATHETERS) ×3 IMPLANT
COVER PROBE U/S 5X48 (MISCELLANEOUS) ×3 IMPLANT
DEVICE PRESTO INFLATION (MISCELLANEOUS) ×3 IMPLANT
GLIDEWIRE ADV .035X180CM (WIRE) ×3 IMPLANT
KIT FEMORAL DEL DENALI (Miscellaneous) ×3 IMPLANT
PACK ANGIOGRAPHY (CUSTOM PROCEDURE TRAY) ×3 IMPLANT
SHEATH 9FRX11 (SHEATH) ×3 IMPLANT
SHEATH BRITE TIP 5FRX11 (SHEATH) ×3 IMPLANT
SHEATH PINNACLE 11FRX10 (SHEATH) ×3 IMPLANT
STENT VENOVO 16X100X80 (Permanent Stent) ×3 IMPLANT
VALVE HEMO TOUHY BORST Y (ADAPTER) ×3 IMPLANT
WIRE J 3MM .035X145CM (WIRE) ×3 IMPLANT

## 2019-03-30 NOTE — Progress Notes (Signed)
Patient is in the Hospital tried calling no answer

## 2019-03-30 NOTE — H&P (Signed)
[facility] History & Physical Update  The patient was interviewed and re-examined.  The patient's previous History and Physical has been reviewed and is unchanged.  There is no change in the plan of care. We plan to proceed with the scheduled procedure.  Leotis Pain, MD  03/30/2019, 12:15 PM

## 2019-03-30 NOTE — Op Note (Signed)
[facility]   OPERATIVE NOTE   PRE-OPERATIVE DIAGNOSIS:  ileofemoral right leg DVT  POST-OPERATIVE DIAGNOSIS: same   PROCEDURE: 1. US guidance for vascular access to left femoral vein and right distal common femoral vein 2. Catheter placement into IVC from left femoral vein approach and into the IVC and the left iliac vein from the right iliac approach 3. IVC gram and right lower extremity venogram 4. IVC filter placement 5. Mechanical thrombectomy to right common femoral vein and right iliac vein with the penumbra CAT D device 6. PTA of right common and external iliac vein with 10 mm balloon 7. PTA of right common femoral vein with 10 mm balloon 8.   Stent placement to the right common and external iliac veins with 16 mm diameter by 10 cm length VeNovo stent   SURGEON: Leotis Pain, MD  ASSISTANT(S): none  ANESTHESIA: local with moderate conscious sedation for 45 minutes using 4 mg of Versed and 100 mcg of Fentanyl  ESTIMATED BLOOD LOSS: 125 cc  FINDING(S): 1. Diffuse narrowing of the right iliac vein system and proximal common femoral vein with thrombus particularly in the more distal portions of the iliac vein and the common femoral vein  SPECIMEN(S): none  INDICATIONS:  Patient is a 68 y.o. female who presents with massive right leg swelling and iliofemoral DVT after an attempted biopsy of a pelvic mass through the femoral vein. Patient has marked leg swelling and pain. Venous intervention is performed to reduce the symtpoms and avoid long term postphlebitic symptoms.   DESCRIPTION: After obtaining full informed written consent, the patient was brought back to the vascular suite and placed supine upon the table.Moderate conscious sedation was administered during a face to face encounter with the patient throughout the procedure with my supervision of the RN administering medicines and monitoring the patient's vital signs, pulse  oximetry, telemetry and mental status throughout from the start of the procedure until the patient was taken to the recovery room. After obtaining adequate anesthesia, the patient was prepped and draped in the standard fashion. The left femoral vein was then accessed under US guidance and found to be widely patent. It was accessed without difficulty and a permanent image was recorded. I then placed the delivery sheath into the IVC. The IVC was patent and the renal veins were at the level of L1. The retrievable IVC filter was then deployed at the level of the top of L2. The delivery sheath was then removed and dressings were placed in the left groin.  The distal right femoral vein at the level of the junction from the profunda vein and the femoral vein was then accessed under direct ultrasound guidance without difficulty with a micropuncture needle and a permanent image was recorded. Initially, the J-wire did not pass easily so a micropuncture sheath and I imaged the area which showed severe narrowing of essentially the entire right iliac vein system creating near occlusive stenosis associated with thrombus particularly in the distal iliac vein and proximal common femoral vein.  I was able to get across this with an advantage wire and remove the micropuncture sheath.  I then upsized to an 11Fr sheath over a J wire. 4000 units of heparin were then given. With the larger sheath, imaging was performed showing the IVC itself to be patent and to fully elucidate the location of the iliac confluence I used a Kumpe catheter and the advantage wire to go up into the IVC and then into cross over  the confluence and get down into the mid left external iliac vein and perform selective imaging.  This demonstrated the confluence very well.  The penumbra CAT D device was brought onto the field and mechanical thrombectomy was performed in the right common femoral vein and right external and common iliac veins.  Following  thrombectomy, there remained significant residual narrowing and I performed angioplasty.  2 inflations with a 10 mm diameter by 8 cm length angioplasty balloon were done up to about 8 to 10 atm with both inflations.  This encompassed the entire iliac vein system as well as the proximal to mid common femoral vein.  Following this, the iliac vein remained highly stenotic and I elected to cover this with a stent.  The common femoral vein was markedly improved and had only about a 20 to 25% residual stenosis.  I selected a 16 mm diameter by 10 cm length  VeNovo stent keeping this about half a centimeter to a centimeter below the iliac vein confluence into the IVC and terminating just above the femoral head.  This was postdilated with a 12 mm balloon with excellent angiographic completion result and less than 10% residual stenosis.  I then elected to terminate the procedure. The sheath was removed and a dressing was placed. She was taken to the recovery room in stable condition having tolerated the procedure well.   COMPLICATIONS: None  CONDITION: Stable  Leotis Pain 03/30/2019 2:11 PM

## 2019-03-31 ENCOUNTER — Telehealth: Payer: Self-pay

## 2019-03-31 ENCOUNTER — Encounter: Payer: Self-pay | Admitting: Vascular Surgery

## 2019-03-31 ENCOUNTER — Inpatient Hospital Stay (HOSPITAL_BASED_OUTPATIENT_CLINIC_OR_DEPARTMENT_OTHER): Payer: Medicare Other | Admitting: Oncology

## 2019-03-31 ENCOUNTER — Other Ambulatory Visit: Payer: Self-pay

## 2019-03-31 ENCOUNTER — Inpatient Hospital Stay: Payer: Medicare Other

## 2019-03-31 VITALS — BP 153/73 | HR 92 | Temp 97.7°F | Resp 16 | Wt 187.9 lb

## 2019-03-31 VITALS — BP 140/64 | HR 62 | Temp 96.1°F | Resp 17

## 2019-03-31 DIAGNOSIS — Z7901 Long term (current) use of anticoagulants: Secondary | ICD-10-CM | POA: Diagnosis not present

## 2019-03-31 DIAGNOSIS — C7910 Secondary malignant neoplasm of unspecified urinary organs: Secondary | ICD-10-CM

## 2019-03-31 DIAGNOSIS — D509 Iron deficiency anemia, unspecified: Secondary | ICD-10-CM | POA: Diagnosis not present

## 2019-03-31 DIAGNOSIS — E785 Hyperlipidemia, unspecified: Secondary | ICD-10-CM | POA: Diagnosis not present

## 2019-03-31 DIAGNOSIS — C679 Malignant neoplasm of bladder, unspecified: Secondary | ICD-10-CM

## 2019-03-31 DIAGNOSIS — Z7189 Other specified counseling: Secondary | ICD-10-CM

## 2019-03-31 DIAGNOSIS — D631 Anemia in chronic kidney disease: Secondary | ICD-10-CM

## 2019-03-31 DIAGNOSIS — I82411 Acute embolism and thrombosis of right femoral vein: Secondary | ICD-10-CM

## 2019-03-31 DIAGNOSIS — N1831 Chronic kidney disease, stage 3a: Secondary | ICD-10-CM | POA: Diagnosis not present

## 2019-03-31 DIAGNOSIS — N179 Acute kidney failure, unspecified: Secondary | ICD-10-CM

## 2019-03-31 DIAGNOSIS — Z86718 Personal history of other venous thrombosis and embolism: Secondary | ICD-10-CM | POA: Diagnosis not present

## 2019-03-31 DIAGNOSIS — Z5112 Encounter for antineoplastic immunotherapy: Secondary | ICD-10-CM | POA: Diagnosis not present

## 2019-03-31 LAB — CBC WITH DIFFERENTIAL/PLATELET
Abs Immature Granulocytes: 0.04 10*3/uL (ref 0.00–0.07)
Basophils Absolute: 0 10*3/uL (ref 0.0–0.1)
Basophils Relative: 0 %
Eosinophils Absolute: 0.1 10*3/uL (ref 0.0–0.5)
Eosinophils Relative: 1 %
HCT: 26.3 % — ABNORMAL LOW (ref 36.0–46.0)
Hemoglobin: 8.1 g/dL — ABNORMAL LOW (ref 12.0–15.0)
Immature Granulocytes: 1 %
Lymphocytes Relative: 11 %
Lymphs Abs: 0.8 10*3/uL (ref 0.7–4.0)
MCH: 24.2 pg — ABNORMAL LOW (ref 26.0–34.0)
MCHC: 30.8 g/dL (ref 30.0–36.0)
MCV: 78.5 fL — ABNORMAL LOW (ref 80.0–100.0)
Monocytes Absolute: 0.6 10*3/uL (ref 0.1–1.0)
Monocytes Relative: 8 %
Neutro Abs: 5.8 10*3/uL (ref 1.7–7.7)
Neutrophils Relative %: 79 %
Platelets: 270 10*3/uL (ref 150–400)
RBC: 3.35 MIL/uL — ABNORMAL LOW (ref 3.87–5.11)
RDW: 16.9 % — ABNORMAL HIGH (ref 11.5–15.5)
WBC: 7.3 10*3/uL (ref 4.0–10.5)
nRBC: 0 % (ref 0.0–0.2)

## 2019-03-31 LAB — RETIC PANEL
Immature Retic Fract: 10.1 % (ref 2.3–15.9)
RBC.: 3.35 MIL/uL — ABNORMAL LOW (ref 3.87–5.11)
Retic Count, Absolute: 62.6 10*3/uL (ref 19.0–186.0)
Retic Ct Pct: 1.9 % (ref 0.4–3.1)
Reticulocyte Hemoglobin: 28 pg (ref >27.9)

## 2019-03-31 LAB — COMPREHENSIVE METABOLIC PANEL
ALT: 22 U/L (ref 0–44)
AST: 28 U/L (ref 15–41)
Albumin: 3.5 g/dL (ref 3.5–5.0)
Alkaline Phosphatase: 108 U/L (ref 38–126)
Anion gap: 9 (ref 5–15)
BUN: 23 mg/dL (ref 8–23)
CO2: 18 mmol/L — ABNORMAL LOW (ref 22–32)
Calcium: 8.8 mg/dL — ABNORMAL LOW (ref 8.9–10.3)
Chloride: 113 mmol/L — ABNORMAL HIGH (ref 98–111)
Creatinine, Ser: 1.38 mg/dL — ABNORMAL HIGH (ref 0.44–1.00)
GFR calc Af Amer: 45 mL/min — ABNORMAL LOW (ref >60)
GFR calc non Af Amer: 39 mL/min — ABNORMAL LOW (ref >60)
Glucose, Bld: 115 mg/dL — ABNORMAL HIGH (ref 70–99)
Potassium: 3.6 mmol/L (ref 3.5–5.1)
Sodium: 140 mmol/L (ref 135–145)
Total Bilirubin: 0.5 mg/dL (ref 0.3–1.2)
Total Protein: 7 g/dL (ref 6.5–8.1)

## 2019-03-31 LAB — TSH: TSH: 4.021 u[IU]/mL (ref 0.350–4.500)

## 2019-03-31 MED ORDER — SODIUM CHLORIDE 0.9 % IV SOLN
Freq: Once | INTRAVENOUS | Status: AC
  Administered 2019-03-31: 10:00:00 via INTRAVENOUS
  Filled 2019-03-31: qty 250

## 2019-03-31 MED ORDER — HEPARIN SOD (PORK) LOCK FLUSH 100 UNIT/ML IV SOLN
500.0000 [IU] | Freq: Once | INTRAVENOUS | Status: DC | PRN
  Filled 2019-03-31: qty 5

## 2019-03-31 MED ORDER — HEPARIN SOD (PORK) LOCK FLUSH 100 UNIT/ML IV SOLN
500.0000 [IU] | Freq: Once | INTRAVENOUS | Status: AC
  Administered 2019-03-31: 500 [IU] via INTRAVENOUS

## 2019-03-31 MED ORDER — IRON SUCROSE 20 MG/ML IV SOLN
200.0000 mg | Freq: Once | INTRAVENOUS | Status: AC
  Administered 2019-03-31: 11:00:00 200 mg via INTRAVENOUS
  Filled 2019-03-31: qty 10

## 2019-03-31 MED ORDER — SODIUM CHLORIDE 0.9 % IV SOLN
200.0000 mg | Freq: Once | INTRAVENOUS | Status: AC
  Administered 2019-03-31: 10:00:00 200 mg via INTRAVENOUS
  Filled 2019-03-31: qty 8

## 2019-03-31 MED ORDER — SODIUM CHLORIDE 0.9% FLUSH
10.0000 mL | Freq: Once | INTRAVENOUS | Status: DC
  Filled 2019-03-31: qty 10

## 2019-03-31 NOTE — Progress Notes (Signed)
Patient saw vascular surgeon yesterday and she feels that her leg has improved.

## 2019-03-31 NOTE — Telephone Encounter (Signed)
Received VM from [place].  The PDL1 was performed and resulted.  She is going to call back for a fax number to send results to.

## 2019-03-31 NOTE — Progress Notes (Signed)
Hematology/Oncology follow up note [facility] Telephone:([phone]) [phone] Fax:([phone]   Patient Care Team: Volney American, PA-C as PCP - General (Family Medicine) Idelle Leech, OD (Optometry)  REFERRING PROVIDER: Dr.Sninsky CHIEF COMPLAINTS/REASON FOR VISIT:  Follow up for bladder cancer, anemia.   HISTORY OF PRESENTING ILLNESS:  Angela Russell is a  68 y.o.  female with PMH listed below who was referred to me for evaluation of newly diagnosed bladder cancer. Patient initially presented to emergency room at the end of January 2020 for evaluation of dysuria, hematuria and the left lower quadrant inguinal pain and flank pain.  1/28 2020 CT renal stone study showed suspected irregular wall thickening about the superior bladder, not well assessed due to degree of bladder distention.  Recommend cystoscopy for further evaluation.  No renal stone or obstructive uropathy.  Patient was given IV Rocephin and referred patient for outpatient urology follow-up.  Urine culture was negative.  She again presented to ER after 2 days with similar symptoms.  Patient has 25-pack-year smoking history, quit approximately 20 years ago.  No family history of any urology malignancies 07/03/2018 another CT abdomen pelvis with contrast was done which showed no nephrolithiasis or hydronephrosis is identified.  Bladder is decompressed limiting evaluation.  07/16/2018.urology Dr. [name] - cystoscopy and bilateral retrograde pyelogram on 07/16/2018.  Pyelogram did not show any filling defect or abnormalities.  No hydronephrosis.  Ureteral orifice was not involved with tumor.  There is a large 5 cm posterior wall bladder tumor, bullous and sessile appearing.  Patient underwent TURBT.   Pathology: High-grade urothelial carcinoma, invasive into muscularis propria.  Lymphovascular invasion is present.  Carcinoma in situ is also identified.  Focal squamous differentiation is noted, areas of  invasive carcinoma display pleomorphic/sarcomatoid changes.  T2b  08/07/2018 CT without contrast negative.  2 subpleural right upper lobe nodule 2 to 3 mm likely benign. She also had baseline audiometry done.  08/04/2018 ddMVAC x 1 cycle, stopped due to intolerance and AKI.  Patient received 1 cycle of dd MVAC, not able to tolerate due to AKI. Patient then was referred to [facility] urology  09/22/2018 patient underwent a cystectomy, pathology pT3a N0 Mx.  11 lymph nodes were harvested and was all negative. Invasive urothelia carcinoma, high grade, with sarcomatoid features.   10/14/2018 patient was admitted due to pyelonephritis and a pelvic fluid collection.  Drain was placed. 10/23/2018 drain was removed. Patient has had difficulties getting to her appointments to [facility]. 02/09/2019, patient presented with abdominal pain. CT concerning for small bowel obstruction and increased size of right pelvic fluid collection concerning for cancer recurrence.  Right hydronephrosis to the level of pelvis and enlarged lymph nodes. Patient had JP drain placed with CT guidance to pelvic fluid collection and drained 400 cc amber fluid.  Culture was negative for growth of microorganisms and cytology was negative for malignancy.-JP drain was removed on the day of discharge. CT-guided core biopsy of pelvic lymph node adenopathy was attempted but not successful. Patient was scheduled to have a PET scan done however she is severely claustrophobic and not able to get a PET scan done.  # Right lower extremity DVT, provoked by Transvenous biopsy  02/26/2019 transvenous biopsy by IR unsuccessful transvenous biopsy of the right pelvis mass. Post biopsy acute thrombus in the right external iliac and common femoral vein.  Patient was recommended to start anticoagulation with Xarelto however due to the co-pay, patient is not able to afford the medication. Anticoagulation regimen  was switched to Eliquis 5 mg.  Patient reports  that she has been taking it once a day.   # increased right lower extremity swelling.  She was started on Eliquis for anticoagulation by [facility].  We clarified with her pharmacy and she was actually taking Eliquis 2.5 mg twice daily. Right lower extremity swelling has not improved but instead worsened. She had ultrasound right lower extremity done which showed persistent extensive proximal right lower extremity DVT.  Anticoagulation regimen has increased to Eliquis 5 mg twice daily.  # establish care with [facility] oncology Dr. [name] for evaluation.  Dr. [name] recommended starting immunotherapy with PD-L1 inhibitor Pembrolizumab 200 mg every 3 weeks.  The sarcomatoid histology may not respond to chemotherapy well, could portend a better chance of response into immunotherapy # Dr. [name] recommended PET scan in 14 weeks.  INTERVAL HISTORY Angela Russell is a 68 y.o. female who has above history reviewed by me today presents for follow up visit for evaluation form metastatic high-grade urothelial carcinoma of the bladder. Sarcomatoid feature.   #Right lower extremity provoked DVT, on Eliquis 5 mg twice daily. Patient was referred to vascular surgery and was seen by Dr.Dew.  Status post IVC filter placement, mechanical thrombectomy. Status post stent placement of the right common and external iliac vein Patient reports the right lower extremity swelling has improved since the procedure.   #Anemia in CKD, patient has been started on IV Venofer treatments to further improve her iron stores. Today patient reports no new complaints.  Doing well at baseline.  She has multiple questions regarding immunotherapy. Denies fever, chills, nausea, vomiting, diarrhea, chest pain, shortness of breath, abdominal pain, urinary symptoms, lower extremity swelling.    Review of Systems  Constitutional: Positive for fatigue. Negative for appetite change, chills and fever.  HENT:   Negative for hearing loss and  voice change.   Eyes: Negative for eye problems.  Respiratory: Negative for chest tightness and cough.   Cardiovascular: Positive for leg swelling. Negative for chest pain.  Gastrointestinal: Negative for abdominal distention, abdominal pain and blood in stool.  Endocrine: Negative for hot flashes.  Genitourinary: Negative for difficulty urinating and frequency.   Musculoskeletal: Negative for arthralgias.  Skin: Negative for itching and rash.  Neurological: Negative for extremity weakness.  Hematological: Negative for adenopathy.  Psychiatric/Behavioral: Negative for confusion. The patient is not nervous/anxious.     MEDICAL HISTORY:  Past Medical History:  Diagnosis Date   Carotid artery plaque, right 01/2014   CKD (chronic kidney disease)    stage 2-3   Diabetes mellitus without complication (HCC)    DM (diabetes mellitus), type 2, uncontrolled (HCC)    Hyperlipidemia    Hypertension    Hypochromic microcytic anemia    mild   Metastatic urothelial carcinoma ([place]) 03/23/2019   Osteoporosis    Renal insufficiency    Rotator cuff tendonitis, right     SURGICAL HISTORY: Past Surgical History:  Procedure Laterality Date   ABDOMINAL HYSTERECTOMY  2000   due to bleeding and fibroids, partial- still has ovaries   CYSTOSCOPY W/ RETROGRADES Bilateral 07/16/2018   Procedure: CYSTOSCOPY WITH RETROGRADE PYELOGRAM;  Surgeon: Billey Co, MD;  Location: [facility] ORS;  Service: Urology;  Laterality: Bilateral;   IVC FILTER INSERTION N/A 03/30/2019   Procedure: IVC FILTER INSERTION;  Surgeon: Algernon Huxley, MD;  Location: [place] [facility];  Service: Cardiovascular;  Laterality: N/A;   KNEE SURGERY Left 03/17/2013   torn meniscus   PERIPHERAL VASCULAR  THROMBECTOMY Right 03/30/2019   Procedure: PERIPHERAL VASCULAR THROMBECTOMY;  Surgeon: Algernon Huxley, MD;  Location: [place] [facility];  Service: Cardiovascular;  Laterality: Right;   PORTA CATH INSERTION N/A 08/06/2018    Procedure: PORTA CATH INSERTION;  Surgeon: Algernon Huxley, MD;  Location: [place] [facility];  Service: Cardiovascular;  Laterality: N/A;   TRANSURETHRAL RESECTION OF BLADDER TUMOR N/A 07/16/2018   Procedure: TRANSURETHRAL RESECTION OF BLADDER TUMOR (TURBT);  Surgeon: Billey Co, MD;  Location: [facility] ORS;  Service: Urology;  Laterality: N/A;    SOCIAL HISTORY: Social History   Socioeconomic History   Marital status: Single    Spouse name: Not on file   Number of children: 1   Years of education: Not on file   Highest education level: Not on file  Occupational History   Not on file  Social Needs   Financial resource strain: Not hard at all   Food insecurity    Worry: Never true    Inability: Never true   Transportation needs    Medical: No    Non-medical: No  Tobacco Use   Smoking status: Former Smoker    Quit date: 06/05/1991    Years since quitting: 27.8   Smokeless tobacco: Never Used  Substance and Sexual Activity   Alcohol use: No   Drug use: No   Sexual activity: Never  Lifestyle   Physical activity    Days per week: 2 days    Minutes per session: 30 min   Stress: Not at all  Relationships   Social connections    Talks on phone: More than three times a week    Gets together: More than three times a week    Attends religious service: More than 4 times per year    Active member of club or organization: Yes    Attends meetings of clubs or organizations: More than 4 times per year    Relationship status: Separated   Intimate partner violence    Fear of current or ex partner: No    Emotionally abused: No    Physically abused: No    Forced sexual activity: No  Other Topics Concern   Not on file  Social History Narrative   Working full time    FAMILY HISTORY: Family History  Problem Relation Age of Onset   Heart disease Mother    Heart attack Mother    Arthritis Father    Diabetes Brother     ALLERGIES:  has No Known  Allergies.  MEDICATIONS:  Current Outpatient Medications  Medication Sig Dispense Refill   amLODipine (NORVASC) 5 MG tablet Take 1 tablet (5 mg total) by mouth daily. 90 tablet 1   apixaban (ELIQUIS) 5 MG TABS tablet Take 1 tablet (5 mg total) by mouth 2 (two) times daily. 60 tablet 3   diclofenac sodium (VOLTAREN) 1 % GEL Apply 2 g topically 4 (four) times daily. 100 g 2   lidocaine-prilocaine (EMLA) cream Apply to affected area once 30 g 3   polyethylene glycol (MIRALAX / GLYCOLAX) 17 g packet Take 17 g by mouth daily as needed for mild constipation. 14 each 0   pravastatin (PRAVACHOL) 10 MG tablet Take 1 tablet (10 mg total) by mouth at bedtime. 90 tablet 1   senna (SENOKOT) 8.6 MG TABS tablet Take 2 tablets (17.2 mg total) by mouth daily. 60 tablet 0   No current facility-administered medications for this visit.      PHYSICAL EXAMINATION: ECOG  PERFORMANCE STATUS: 2 - Symptomatic, <50% confined to bed Vitals:   03/31/19 0850  BP: (!) 153/73  Pulse: 92  Resp: 16  Temp: 97.7 F (36.5 C)   Filed Weights   03/31/19 0850  Weight: 187 lb 14.4 oz (85.2 kg)    Physical Exam Constitutional:      General: She is not in acute distress.    Comments: Walk in today  HENT:     Head: Normocephalic and atraumatic.  Eyes:     General: No scleral icterus.    Pupils: Pupils are equal, round, and reactive to light.  Neck:     Musculoskeletal: Normal range of motion and neck supple.  Cardiovascular:     Rate and Rhythm: Normal rate and regular rhythm.     Heart sounds: Normal heart sounds.  Pulmonary:     Effort: Pulmonary effort is normal. No respiratory distress.     Breath sounds: No wheezing.  Abdominal:     General: Bowel sounds are normal. There is no distension.     Palpations: Abdomen is soft. There is no mass.     Tenderness: There is no abdominal tenderness.  Musculoskeletal: Normal range of motion.        General: Swelling present. No deformity.     Comments:  Right lower extremity 1+ edema  Skin:    General: Skin is warm and dry.     Coloration: Skin is pale.     Findings: No erythema or rash.  Neurological:     Mental Status: She is alert and oriented to person, place, and time.     Cranial Nerves: No cranial nerve deficit.     Coordination: Coordination normal.  Psychiatric:        Behavior: Behavior normal.        Thought Content: Thought content normal.     RADIOGRAPHIC STUDIES: I have personally reviewed the radiological images as listed and agreed with the findings in the report.  CMP Latest Ref Rng & Units 03/31/2019  Glucose 70 - 99 mg/dL 115(H)  BUN 8 - 23 mg/dL 23  Creatinine 0.44 - 1.00 mg/dL 1.38(H)  Sodium 135 - 145 mmol/L 140  Potassium 3.5 - 5.1 mmol/L 3.6  Chloride 98 - 111 mmol/L 113(H)  CO2 22 - 32 mmol/L 18(L)  Calcium 8.9 - 10.3 mg/dL 8.8(L)  Total Protein 6.5 - 8.1 g/dL 7.0  Total Bilirubin 0.3 - 1.2 mg/dL 0.5  Alkaline Phos 38 - 126 U/L 108  AST 15 - 41 U/L 28  ALT 0 - 44 U/L 22   CBC Latest Ref Rng & Units 03/31/2019  WBC 4.0 - 10.5 K/uL 7.3  Hemoglobin 12.0 - 15.0 g/dL 8.1(L)  Hematocrit 36.0 - 46.0 % 26.3(L)  Platelets 150 - 400 K/uL 270    LABORATORY DATA:  I have reviewed the data as listed Lab Results  Component Value Date   WBC 7.3 03/31/2019   HGB 8.1 (L) 03/31/2019   HCT 26.3 (L) 03/31/2019   MCV 78.5 (L) 03/31/2019   PLT 270 03/31/2019   Recent Labs    12/19/18 1841  12/20/18 0514 12/21/18 0852 03/04/19 1423 03/30/19 1213 03/31/19 0815  NA 137   < > 139 143 141  --  140  K 6.3*   < > 5.0 3.7 4.6  --  3.6  CL 115*   < > 111 101 110  --  113*  CO2 15*   < > '22 31 23  '$ --  18*  GLUCOSE 129*   < > 106* 142* 116*  --  115*  BUN 37*   < > 30* 21 26* 31* 23  CREATININE 2.03*   < > 1.76* 1.77* 1.54* 1.40* 1.38*  CALCIUM 9.7   < > 9.0 8.8* 9.4  --  8.8*  GFRNONAA 24*   < > 29* 29* 34* 38* 39*  GFRAA 28*   < > 34* 33* 39* 44* 45*  PROT  --    < > 6.3*  --  7.9  --  7.0  ALBUMIN  --     < > 3.3*  --  3.7  --  3.5  AST  --    < > 17  --  27  --  28  ALT  --    < > 9  --  17  --  22  ALKPHOS  --    < > 67  --  122  --  108  BILITOT 0.5   < > 0.3  --  0.3  --  0.5  BILIDIR <0.1  --   --   --   --   --   --   IBILI NOT CALCULATED  --   --   --   --   --   --    < > = values in this interval not displayed.   Iron/TIBC/Ferritin/ %Sat    Component Value Date/Time   IRON 46 03/04/2019 1423   IRON 26 (L) 02/04/2019 1309   TIBC 267 03/04/2019 1423   TIBC 251 02/04/2019 1309   FERRITIN 316 (H) 03/04/2019 1423   FERRITIN 409 (H) 02/04/2019 1309   IRONPCTSAT 17 03/04/2019 1423   IRONPCTSAT 10 (L) 02/04/2019 1309    RADIOGRAPHIC STUDIES: I have personally reviewed the radiological images as listed and agreed with the findings in the report. US Venous Img Lower Unilateral Right  Result Date: 03/16/2019 CLINICAL DATA:  68 year old female with persistent right lower extremity swelling and a known right-sided DVT for the past 2 weeks. EXAM: RIGHT LOWER EXTREMITY VENOUS DOPPLER ULTRASOUND TECHNIQUE: Gray-scale sonography with graded compression, as well as color Doppler and duplex ultrasound were performed to evaluate the lower extremity deep venous systems from the level of the common femoral vein and including the common femoral, femoral, profunda femoral, popliteal and calf veins including the posterior tibial, peroneal and gastrocnemius veins when visible. The superficial great saphenous vein was also interrogated. Spectral Doppler was utilized to evaluate flow at rest and with distal augmentation maneuvers in the common femoral, femoral and popliteal veins. COMPARISON:  Recent duplex venous ultrasound 03/06/2019 FINDINGS: Contralateral Common Femoral Vein: Respiratory phasicity is normal and symmetric with the symptomatic side. No evidence of thrombus. Normal compressibility. Common Femoral Vein: Similar appearance of the right common femoral vein which remains only partially  compressible with internal eccentric filling defects and only partial flow on color Doppler imaging. The findings remain consistent with nonocclusive DVT. Saphenofemoral Junction: No evidence of thrombus. Normal compressibility and flow on color Doppler imaging. Profunda Femoral Vein: No evidence of thrombus. Normal compressibility and flow on color Doppler imaging. Femoral Vein: No evidence of thrombus. Normal compressibility, respiratory phasicity and response to augmentation. Popliteal Vein: No evidence of thrombus. Normal compressibility, respiratory phasicity and response to augmentation. Calf Veins: No evidence of thrombus. Normal compressibility and flow on color Doppler imaging. Superficial Great Saphenous Vein: No evidence of thrombus. Normal compressibility. Venous Reflux:  None. Other Findings:  None. IMPRESSION: Persistent and unchanged  nonocclusive thrombus in the right common femoral vein. No significant interval improvement compared to 03/06/2019. Electronically Signed   By: Jacqulynn Cadet M.D.   On: 03/16/2019 16:11   US Venous Img Lower Unilateral Right  Result Date: 03/06/2019 CLINICAL DATA:  Pain and swelling for 1 week EXAM: Right LOWER EXTREMITY VENOUS DOPPLER ULTRASOUND TECHNIQUE: Gray-scale sonography with graded compression, as well as color Doppler and duplex ultrasound were performed to evaluate the lower extremity deep venous systems from the level of the common femoral vein and including the common femoral, femoral, profunda femoral, popliteal and calf veins including the posterior tibial, peroneal and gastrocnemius veins when visible. The superficial great saphenous vein was also interrogated. Spectral Doppler was utilized to evaluate flow at rest and with distal augmentation maneuvers in the common femoral, femoral and popliteal veins. COMPARISON:  None. FINDINGS: Contralateral Common Femoral Vein: Respiratory phasicity is normal and symmetric with the symptomatic side. No  evidence of thrombus. Normal compressibility. Common Femoral Vein: Nonocclusive thrombus within the right common femoral vein, extends into the right external iliac vein. Vessel is noncompressible. Saphenofemoral Junction: Thrombus within the common femoral vein extending to the saphenofemoral junction. Profunda Femoral Vein: No evidence of thrombus. Normal compressibility and flow on color Doppler imaging. Femoral Vein: No evidence of thrombus. Normal compressibility, respiratory phasicity and response to augmentation. Popliteal Vein: No evidence of thrombus. Normal compressibility, respiratory phasicity and response to augmentation. Calf Veins: No evidence of thrombus. Normal compressibility and flow on color Doppler imaging. Other Findings:  Limited evaluation of the IVC shows patency. IMPRESSION: Positive for acute nonocclusive DVT extending from the right external iliac vein to the common femoral vein. Electronically Signed   By: Donavan Foil M.D.   On: 03/06/2019 21:03   Dg Knee Complete 4 Views Left  Result Date: 02/08/2019 CLINICAL DATA:  Knee pain EXAM: LEFT KNEE - COMPLETE 4+ VIEW COMPARISON:  09/02/2012 FINDINGS: Moderate degenerative joint disease changes, most pronounced in the medial and patellofemoral compartments with joint space narrowing and spurring. No acute bony abnormality. Specifically, no fracture, subluxation, or dislocation. No joint effusion. IMPRESSION: Moderate degenerative changes.  No acute bony abnormality. Electronically Signed   By: Rolm Baptise M.D.   On: 02/08/2019 01:37   Vas [place] Lower Extremity Venous (dvt)  Result Date: 03/27/2019  Lower Venous Study Risk Factors: DVT right. Comparison Study: 03/16/2019 Performing Technologist: Concha Norway RVT  Examination Guidelines: A complete evaluation includes B-mode imaging, spectral Doppler, color Doppler, and power Doppler as needed of all accessible portions of each vessel. Bilateral testing is considered an integral part of a  complete examination. Limited examinations for reoccurring indications may be performed as noted.  +---------+---------------+---------+-----------+-----------------+------------+  RIGHT     Compressibility Phasicity Spontaneity Properties        Thrombus                                                                         Aging         +---------+---------------+---------+-----------+-----------------+------------+  CFV       Partial         Yes       Yes         partially  Chronic                                                        re-cannalized                   +---------+---------------+---------+-----------+-----------------+------------+  SFJ       Full            Yes       Yes                                         +---------+---------------+---------+-----------+-----------------+------------+  FV Prox   Full                                                                  +---------+---------------+---------+-----------+-----------------+------------+  FV Mid    Full            Yes       Yes                                         +---------+---------------+---------+-----------+-----------------+------------+  FV Distal Full                                                                  +---------+---------------+---------+-----------+-----------------+------------+  PFV       Full            Yes       Yes                                         +---------+---------------+---------+-----------+-----------------+------------+  POP       Full            Yes       Yes                                         +---------+---------------+---------+-----------+-----------------+------------+  PTV       Full            Yes       Yes                                         +---------+---------------+---------+-----------+-----------------+------------+  PERO      Full            Yes       Yes                                          +---------+---------------+---------+-----------+-----------------+------------+  Gastroc   Full                                                                  +---------+---------------+---------+-----------+-----------------+------------+  GSV       Full            Yes       Yes                                         +---------+---------------+---------+-----------+-----------------+------------+  SSV       Full                                                                  +---------+---------------+---------+-----------+-----------------+------------+   +-------+---------------+---------+-----------+----------+--------------+  LEFT    Compressibility Phasicity Spontaneity Properties Thrombus Aging  +-------+---------------+---------+-----------+----------+--------------+  CFV     Full            Yes                                              +-------+---------------+---------+-----------+----------+--------------+  SFJ     Full            Yes                                              +-------+---------------+---------+-----------+----------+--------------+  FV Prox Full            Yes                                              +-------+---------------+---------+-----------+----------+--------------+     Summary: Right: Continued minimal chronic appearing thrombus on the posterior wall of the CFV only. Recannalized flow seen. No DVT found in remainder of lower extremity venous system Left: No evidence of common femoral vein obstruction.  *See table(s) above for measurements and observations. Electronically signed by Leotis Pain MD on 03/27/2019 at 1:17:26 PM.    Final    Patient had PET scan done 03/20/2019 which showed enhancing soft nodule in the cystectomy bed is worrisome for local recurrence. Right greater than left pelvic retroperitoneal and mesenteric lymphadenopathy consistent with nodal metastatic disease Deep vein thrombosis is seen in the right common iliac vein.  The upstream right  external iliac vein is severely narrowed by right pelvic sidewall lymphadenopathy and postsurgical changes.  The right lower extremity appears enlarged and erythematous, likely due to venous insufficiency.  Loculated pelvic fluid collection appears smaller compared to February 09, 2019 scan.  ASSESSMENT & PLAN:  1. Metastatic urothelial carcinoma ([place])   2. Acute deep vein thrombosis (DVT) of femoral vein  of right lower extremity ([place])   3. Goals of care, counseling/discussion   4. Anemia in stage 3a chronic kidney disease   Cancer Staging Bladder carcinoma [facility]) Staging form: Urinary Bladder, AJCC 8th Edition - Clinical stage from 08/01/2018: Stage II (cT2, cN0, cM0) - Signed by [name], MD on 08/03/2018  #Metastatic urothelial carcinoma of bladder, sarcomatoid features pelvic sidewall mass showed metastatic carcinoma consistent with involvement by urothelial carcinoma.  I had a lengthy discussion with patient again today.  Patient has multiple questions today. I discussed that patient has stage IV/metastatic bladder cancer.  The goal of chemotherapy being palliative as the disease is incurable. Goal is to help improve symptoms and maintain life quality. Patient understands that median survival does not translate to their own survival. We discussed in details about immunotherapy side effects and rationale last visit.  Today we discussed again. She voices understanding and willing to proceed with cycle 1 Keytruda today.  Per Dr. Ruthe Mannan note, PD-L1 will be checked at [facility].  We obtained PD-L1 from [place] which is scanned to EMR. PD-L1 CPS 100%.   #Anemia in CKD Erythropoietin therapy is contraindicated due to active cancer. Patient has received IV iron to further improve iron store and hopefully improve her anemia level. Will proceed with IV Venofer 200 mg x 1 today.  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 Status post vascular surgery intervention.  Status post stent placement and IVC filter and mechanical thrombectomy. Continue Eliquis 5 mg twice daily. Refer to palliative care service to establish care.  Discussed with Dr. Regenia Skeeter.  Orders Placed This Encounter  Procedures   Retic Panel    Standing Status:   Future    Standing Expiration Date:   03/30/2020   Ambulatory Referral to Palliative Care    Referral Priority:   Routine    Referral Type:   Consultation    Referred to Provider:   Borders, Kirt Boys, NP    Number of Visits Requested:   1    All questions were answered. The patient knows to call the clinic with any problems questions or concerns.  Return of visit: 1 week.  [name], MD, PhD Hematology Oncology [facility] at [facility] Pager- [phone] 03/31/19

## 2019-03-31 NOTE — Progress Notes (Signed)
Per Duwayne Heck CMA per Dr. Tasia Catchings pt to receive Keytruda infusion and Venofer at this time.   Pt tolerated treatment well. Pt and VS Stable at discharge.

## 2019-03-31 NOTE — Telephone Encounter (Signed)
PDL1 results received and scanned in.  It is dated for 09/2018 since that is the collection date.

## 2019-03-31 NOTE — Telephone Encounter (Signed)
Left message to see if PDL1 testing sent.  Dr. Ruthe Mannan note from 03/21/19 states they are sending out specimen for PDL1 testing.

## 2019-04-03 ENCOUNTER — Other Ambulatory Visit: Payer: Self-pay | Admitting: Family Medicine

## 2019-04-03 DIAGNOSIS — C772 Secondary and unspecified malignant neoplasm of intra-abdominal lymph nodes: Secondary | ICD-10-CM | POA: Diagnosis not present

## 2019-04-03 DIAGNOSIS — C674 Malignant neoplasm of posterior wall of bladder: Secondary | ICD-10-CM | POA: Diagnosis not present

## 2019-04-03 MED ORDER — PRAVASTATIN SODIUM 10 MG PO TABS: 10.0000 mg | ORAL_TABLET | Freq: Every day | ORAL | 1 refills | Status: DC

## 2019-04-03 MED ORDER — AMLODIPINE BESYLATE 5 MG PO TABS: 5.0000 mg | ORAL_TABLET | Freq: Every day | ORAL | 1 refills | Status: DC

## 2019-04-03 MED ORDER — DICLOFENAC SODIUM 1 % TD GEL: 2.0000 g | Freq: Four times a day (QID) | TRANSDERMAL | 2 refills | Status: DC

## 2019-04-03 NOTE — Telephone Encounter (Signed)
Medication Refill - Medication: amLODipine (NORVASC) 5 MG tablet pravastatin (PRAVACHOL) 10 MG tablet  diclofenac sodium (VOLTAREN) 1 % GEL   Has the patient contacted their pharmacy? Yes - pt is completely out (Agent: If no, request that the patient contact the pharmacy for the refill.) (Agent: If yes, when and what did the pharmacy advise?)  Preferred Pharmacy (with phone number or street name):  [facility] ([facility] [place] - [place] [phone] (Phone) [phone] (Fax)   Agent: Please be advised that RX refills may take up to 3 business days. We ask that you follow-up with your pharmacy.

## 2019-04-07 ENCOUNTER — Encounter: Payer: Self-pay | Admitting: Oncology

## 2019-04-07 ENCOUNTER — Inpatient Hospital Stay (HOSPITAL_BASED_OUTPATIENT_CLINIC_OR_DEPARTMENT_OTHER): Payer: Medicare Other | Admitting: Oncology

## 2019-04-07 ENCOUNTER — Other Ambulatory Visit: Payer: Self-pay

## 2019-04-07 ENCOUNTER — Inpatient Hospital Stay: Payer: Medicare Other | Attending: Oncology

## 2019-04-07 VITALS — BP 149/66 | HR 89 | Temp 99.0°F | Resp 18 | Wt 186.6 lb

## 2019-04-07 DIAGNOSIS — Z7901 Long term (current) use of anticoagulants: Secondary | ICD-10-CM | POA: Insufficient documentation

## 2019-04-07 DIAGNOSIS — E785 Hyperlipidemia, unspecified: Secondary | ICD-10-CM | POA: Insufficient documentation

## 2019-04-07 DIAGNOSIS — Z7189 Other specified counseling: Secondary | ICD-10-CM | POA: Diagnosis not present

## 2019-04-07 DIAGNOSIS — E119 Type 2 diabetes mellitus without complications: Secondary | ICD-10-CM | POA: Diagnosis not present

## 2019-04-07 DIAGNOSIS — I82411 Acute embolism and thrombosis of right femoral vein: Secondary | ICD-10-CM

## 2019-04-07 DIAGNOSIS — C7910 Secondary malignant neoplasm of unspecified urinary organs: Secondary | ICD-10-CM

## 2019-04-07 DIAGNOSIS — Z86718 Personal history of other venous thrombosis and embolism: Secondary | ICD-10-CM | POA: Diagnosis not present

## 2019-04-07 DIAGNOSIS — C679 Malignant neoplasm of bladder, unspecified: Secondary | ICD-10-CM

## 2019-04-07 DIAGNOSIS — Z87891 Personal history of nicotine dependence: Secondary | ICD-10-CM | POA: Diagnosis not present

## 2019-04-07 DIAGNOSIS — N189 Chronic kidney disease, unspecified: Secondary | ICD-10-CM | POA: Insufficient documentation

## 2019-04-07 DIAGNOSIS — Z5112 Encounter for antineoplastic immunotherapy: Secondary | ICD-10-CM | POA: Diagnosis not present

## 2019-04-07 DIAGNOSIS — N1831 Chronic kidney disease, stage 3a: Secondary | ICD-10-CM

## 2019-04-07 DIAGNOSIS — M810 Age-related osteoporosis without current pathological fracture: Secondary | ICD-10-CM | POA: Insufficient documentation

## 2019-04-07 DIAGNOSIS — Z79899 Other long term (current) drug therapy: Secondary | ICD-10-CM | POA: Diagnosis not present

## 2019-04-07 DIAGNOSIS — I129 Hypertensive chronic kidney disease with stage 1 through stage 4 chronic kidney disease, or unspecified chronic kidney disease: Secondary | ICD-10-CM | POA: Diagnosis not present

## 2019-04-07 DIAGNOSIS — D631 Anemia in chronic kidney disease: Secondary | ICD-10-CM

## 2019-04-07 DIAGNOSIS — D509 Iron deficiency anemia, unspecified: Secondary | ICD-10-CM

## 2019-04-07 LAB — CBC WITH DIFFERENTIAL/PLATELET
Abs Immature Granulocytes: 0.02 10*3/uL (ref 0.00–0.07)
Basophils Absolute: 0 10*3/uL (ref 0.0–0.1)
Basophils Relative: 1 %
Eosinophils Absolute: 0.1 10*3/uL (ref 0.0–0.5)
Eosinophils Relative: 2 %
HCT: 26.6 % — ABNORMAL LOW (ref 36.0–46.0)
Hemoglobin: 8.1 g/dL — ABNORMAL LOW (ref 12.0–15.0)
Immature Granulocytes: 0 %
Lymphocytes Relative: 18 %
Lymphs Abs: 0.9 10*3/uL (ref 0.7–4.0)
MCH: 23.8 pg — ABNORMAL LOW (ref 26.0–34.0)
MCHC: 30.5 g/dL (ref 30.0–36.0)
MCV: 78.2 fL — ABNORMAL LOW (ref 80.0–100.0)
Monocytes Absolute: 0.4 10*3/uL (ref 0.1–1.0)
Monocytes Relative: 9 %
Neutro Abs: 3.4 10*3/uL (ref 1.7–7.7)
Neutrophils Relative %: 70 %
Platelets: 238 10*3/uL (ref 150–400)
RBC: 3.4 MIL/uL — ABNORMAL LOW (ref 3.87–5.11)
RDW: 16.7 % — ABNORMAL HIGH (ref 11.5–15.5)
WBC: 4.9 10*3/uL (ref 4.0–10.5)
nRBC: 0 % (ref 0.0–0.2)

## 2019-04-07 LAB — RETIC PANEL
Immature Retic Fract: 8.2 % (ref 2.3–15.9)
RBC.: 3.4 MIL/uL — ABNORMAL LOW (ref 3.87–5.11)
Retic Count, Absolute: 62.2 10*3/uL (ref 19.0–186.0)
Retic Ct Pct: 1.8 % (ref 0.4–3.1)
Reticulocyte Hemoglobin: 27.3 pg — ABNORMAL LOW (ref >27.9)

## 2019-04-07 NOTE — Progress Notes (Signed)
Patient here for follow up. Swelling to right leg/ankle has decreased and it is not painful anymore. Pt continues to take eliquis 5mg  BID.

## 2019-04-07 NOTE — Progress Notes (Signed)
Hematology/Oncology follow up note [facility] Telephone:([phone] Fax:([phone]   Patient Care Team: Volney American, PA-C as PCP - General (Family Medicine) Idelle Leech, OD (Optometry)  REFERRING PROVIDER: Dr.Sninsky CHIEF COMPLAINTS/REASON FOR VISIT:  Follow up for bladder cancer, anemia.   HISTORY OF PRESENTING ILLNESS:  Angela Russell is a  68 y.o.  female with PMH listed below who was referred to me for evaluation of newly diagnosed bladder cancer. Patient initially presented to emergency room at the end of January 2020 for evaluation of dysuria, hematuria and the left lower quadrant inguinal pain and flank pain.  1/28 2020 CT renal stone study showed suspected irregular wall thickening about the superior bladder, not well assessed due to degree of bladder distention.  Recommend cystoscopy for further evaluation.  No renal stone or obstructive uropathy.  Patient was given IV Rocephin and referred patient for outpatient urology follow-up.  Urine culture was negative.  She again presented to ER after 2 days with similar symptoms.  Patient has 25-pack-year smoking history, quit approximately 20 years ago.  No family history of any urology malignancies 07/03/2018 another CT abdomen pelvis with contrast was done which showed no nephrolithiasis or hydronephrosis is identified.  Bladder is decompressed limiting evaluation.  07/16/2018.urology Dr. [name] - cystoscopy and bilateral retrograde pyelogram on 07/16/2018.  Pyelogram did not show any filling defect or abnormalities.  No hydronephrosis.  Ureteral orifice was not involved with tumor.  There is a large 5 cm posterior wall bladder tumor, bullous and sessile appearing.  Patient underwent TURBT.   Pathology: High-grade urothelial carcinoma, invasive into muscularis propria.  Lymphovascular invasion is present.  Carcinoma in situ is also identified.  Focal squamous differentiation is noted, areas of  invasive carcinoma display pleomorphic/sarcomatoid changes.  T2b  08/07/2018 CT without contrast negative.  2 subpleural right upper lobe nodule 2 to 3 mm likely benign. She also had baseline audiometry done.  08/04/2018 ddMVAC x 1 cycle, stopped due to intolerance and AKI.  Patient received 1 cycle of dd MVAC, not able to tolerate due to AKI. Patient then was referred to [facility] urology  09/22/2018 patient underwent a cystectomy, pathology pT3a N0 Mx.  11 lymph nodes were harvested and was all negative. Invasive urothelia carcinoma, high grade, with sarcomatoid features.   10/14/2018 patient was admitted due to pyelonephritis and a pelvic fluid collection.  Drain was placed. 10/23/2018 drain was removed. Patient has had difficulties getting to her appointments to [facility]. 02/09/2019, patient presented with abdominal pain. CT concerning for small bowel obstruction and increased size of right pelvic fluid collection concerning for cancer recurrence.  Right hydronephrosis to the level of pelvis and enlarged lymph nodes. Patient had JP drain placed with CT guidance to pelvic fluid collection and drained 400 cc amber fluid.  Culture was negative for growth of microorganisms and cytology was negative for malignancy.-JP drain was removed on the day of discharge. CT-guided core biopsy of pelvic lymph node adenopathy was attempted but not successful. Patient was scheduled to have a PET scan done however she is severely claustrophobic and not able to get a PET scan done.  # Right lower extremity DVT, provoked by Transvenous biopsy  02/26/2019 transvenous biopsy by IR unsuccessful transvenous biopsy of the right pelvis mass. Post biopsy acute thrombus in the right external iliac and common femoral vein.  Patient was recommended to start anticoagulation with Xarelto however due to the co-pay, patient is not able to afford the medication. Anticoagulation regimen  was switched to Eliquis 5 mg.  Patient reports  that she has been taking it once a day.   # increased right lower extremity swelling.  She was started on Eliquis for anticoagulation by [facility].  We clarified with her pharmacy and she was actually taking Eliquis 2.5 mg twice daily. Right lower extremity swelling has not improved but instead worsened. She had ultrasound right lower extremity done which showed persistent extensive proximal right lower extremity DVT.  Anticoagulation regimen has increased to Eliquis 5 mg twice daily.  # establish care with [facility] oncology Dr. [name] for evaluation.  Dr. [name] recommended starting immunotherapy with PD-L1 inhibitor Pembrolizumab 200 mg every 3 weeks.  The sarcomatoid histology may not respond to chemotherapy well, could portend a better chance of response into immunotherapy # Dr. [name] recommended PET scan in 14 weeks.  # Status post IVC filter placement, mechanical thrombectomy. # PD-L1 CPS 100%.    INTERVAL HISTORY Angela Russell is a 68 y.o. female who has above history reviewed by me today presents for follow up visit for evaluation form metastatic high-grade urothelial carcinoma of the bladder. Sarcomatoid feature.   #Right lower extremity provoked DVT, on Eliquis 5 mg twice daily.  Status post IVC filter placement and mechanical thrombectomy Right lower extremity swelling has significantly improved.  #Anemia in CKD, reports fatigue level is at baseline.  Doing well. Status post cycle 1 Keytruda 1 week ago. She tolerates well.  Denies any specific side effects. Denies fever, chills, nausea, vomiting, diarrhea, chest pain, shortness of breath, abdominal pain, urinary symptoms, lower extremity swelling.    Review of Systems  Constitutional: Positive for fatigue. Negative for appetite change, chills and fever.  HENT:   Negative for hearing loss and voice change.   Eyes: Negative for eye problems.  Respiratory: Negative for chest tightness and cough.   Cardiovascular: Negative for  chest pain and leg swelling.  Gastrointestinal: Negative for abdominal distention, abdominal pain and blood in stool.  Endocrine: Negative for hot flashes.  Genitourinary: Negative for difficulty urinating and frequency.   Musculoskeletal: Negative for arthralgias.  Skin: Negative for itching and rash.  Neurological: Negative for extremity weakness.  Hematological: Negative for adenopathy.  Psychiatric/Behavioral: Negative for confusion. The patient is not nervous/anxious.     MEDICAL HISTORY:  Past Medical History:  Diagnosis Date   Carotid artery plaque, right 01/2014   CKD (chronic kidney disease)    stage 2-3   Diabetes mellitus without complication (HCC)    DM (diabetes mellitus), type 2, uncontrolled (HCC)    Hyperlipidemia    Hypertension    Hypochromic microcytic anemia    mild   Metastatic urothelial carcinoma ([place]) 03/23/2019   Osteoporosis    Renal insufficiency    Rotator cuff tendonitis, right     SURGICAL HISTORY: Past Surgical History:  Procedure Laterality Date   ABDOMINAL HYSTERECTOMY  2000   due to bleeding and fibroids, partial- still has ovaries   CYSTOSCOPY W/ RETROGRADES Bilateral 07/16/2018   Procedure: CYSTOSCOPY WITH RETROGRADE PYELOGRAM;  Surgeon: Billey Co, MD;  Location: [facility] ORS;  Service: Urology;  Laterality: Bilateral;   IVC FILTER INSERTION N/A 03/30/2019   Procedure: IVC FILTER INSERTION;  Surgeon: Algernon Huxley, MD;  Location: [place] [facility];  Service: Cardiovascular;  Laterality: N/A;   KNEE SURGERY Left 03/17/2013   torn meniscus   PERIPHERAL VASCULAR THROMBECTOMY Right 03/30/2019   Procedure: PERIPHERAL VASCULAR THROMBECTOMY;  Surgeon: Algernon Huxley, MD;  Location: [place] [facility]  LAB;  Service: Cardiovascular;  Laterality: Right;   PORTA CATH INSERTION N/A 08/06/2018   Procedure: PORTA CATH INSERTION;  Surgeon: Algernon Huxley, MD;  Location: [place] [facility];  Service: Cardiovascular;  Laterality: N/A;    TRANSURETHRAL RESECTION OF BLADDER TUMOR N/A 07/16/2018   Procedure: TRANSURETHRAL RESECTION OF BLADDER TUMOR (TURBT);  Surgeon: Billey Co, MD;  Location: [facility] ORS;  Service: Urology;  Laterality: N/A;    SOCIAL HISTORY: Social History   Socioeconomic History   Marital status: Single    Spouse name: Not on file   Number of children: 1   Years of education: Not on file   Highest education level: Not on file  Occupational History   Not on file  Social Needs   Financial resource strain: Not hard at all   Food insecurity    Worry: Never true    Inability: Never true   Transportation needs    Medical: No    Non-medical: No  Tobacco Use   Smoking status: Former Smoker    Quit date: 06/05/1991    Years since quitting: 27.8   Smokeless tobacco: Never Used  Substance and Sexual Activity   Alcohol use: No   Drug use: No   Sexual activity: Never  Lifestyle   Physical activity    Days per week: 2 days    Minutes per session: 30 min   Stress: Not at all  Relationships   Social connections    Talks on phone: More than three times a week    Gets together: More than three times a week    Attends religious service: More than 4 times per year    Active member of club or organization: Yes    Attends meetings of clubs or organizations: More than 4 times per year    Relationship status: Separated   Intimate partner violence    Fear of current or ex partner: No    Emotionally abused: No    Physically abused: No    Forced sexual activity: No  Other Topics Concern   Not on file  Social History Narrative   Working full time    FAMILY HISTORY: Family History  Problem Relation Age of Onset   Heart disease Mother    Heart attack Mother    Arthritis Father    Diabetes Brother     ALLERGIES:  has No Known Allergies.  MEDICATIONS:  Current Outpatient Medications  Medication Sig Dispense Refill   amLODipine (NORVASC) 5 MG tablet Take 1 tablet (5 mg  total) by mouth daily. 90 tablet 1   apixaban (ELIQUIS) 5 MG TABS tablet Take 1 tablet (5 mg total) by mouth 2 (two) times daily. 60 tablet 3   diclofenac sodium (VOLTAREN) 1 % GEL Apply 2 g topically 4 (four) times daily. 100 g 2   lidocaine-prilocaine (EMLA) cream Apply to affected area once 30 g 3   polyethylene glycol (MIRALAX / GLYCOLAX) 17 g packet Take 17 g by mouth daily as needed for mild constipation. 14 each 0   pravastatin (PRAVACHOL) 10 MG tablet Take 1 tablet (10 mg total) by mouth at bedtime. 90 tablet 1   senna (SENOKOT) 8.6 MG TABS tablet Take 2 tablets (17.2 mg total) by mouth daily. 60 tablet 0   No current facility-administered medications for this visit.      PHYSICAL EXAMINATION: ECOG PERFORMANCE STATUS: 1 - Symptomatic but completely ambulatory Vitals:   04/07/19 1333  BP: (!) 149/66  Pulse: 89  Resp: 18  Temp: 99 F (37.2 C)   Filed Weights   04/07/19 1333  Weight: 186 lb 9.6 oz (84.6 kg)    Physical Exam Constitutional:      General: She is not in acute distress.    Comments: Walk in today  HENT:     Head: Normocephalic and atraumatic.  Eyes:     General: No scleral icterus.    Pupils: Pupils are equal, round, and reactive to light.  Neck:     Musculoskeletal: Normal range of motion and neck supple.  Cardiovascular:     Rate and Rhythm: Normal rate and regular rhythm.     Heart sounds: Normal heart sounds.  Pulmonary:     Effort: Pulmonary effort is normal. No respiratory distress.     Breath sounds: No wheezing.  Abdominal:     General: Bowel sounds are normal. There is no distension.     Palpations: Abdomen is soft. There is no mass.     Tenderness: There is no abdominal tenderness.  Musculoskeletal: Normal range of motion.        General: No deformity.     Comments: Trace right lower extremity edema  Skin:    General: Skin is warm and dry.     Coloration: Skin is not pale.     Findings: No erythema or rash.  Neurological:      Mental Status: She is alert and oriented to person, place, and time.     Cranial Nerves: No cranial nerve deficit.     Coordination: Coordination normal.  Psychiatric:        Behavior: Behavior normal.        Thought Content: Thought content normal.     RADIOGRAPHIC STUDIES: I have personally reviewed the radiological images as listed and agreed with the findings in the report.  CMP Latest Ref Rng & Units 03/31/2019  Glucose 70 - 99 mg/dL 115(H)  BUN 8 - 23 mg/dL 23  Creatinine 0.44 - 1.00 mg/dL 1.38(H)  Sodium 135 - 145 mmol/L 140  Potassium 3.5 - 5.1 mmol/L 3.6  Chloride 98 - 111 mmol/L 113(H)  CO2 22 - 32 mmol/L 18(L)  Calcium 8.9 - 10.3 mg/dL 8.8(L)  Total Protein 6.5 - 8.1 g/dL 7.0  Total Bilirubin 0.3 - 1.2 mg/dL 0.5  Alkaline Phos 38 - 126 U/L 108  AST 15 - 41 U/L 28  ALT 0 - 44 U/L 22   CBC Latest Ref Rng & Units 04/07/2019  WBC 4.0 - 10.5 K/uL 4.9  Hemoglobin 12.0 - 15.0 g/dL 8.1(L)  Hematocrit 36.0 - 46.0 % 26.6(L)  Platelets 150 - 400 K/uL 238    LABORATORY DATA:  I have reviewed the data as listed Lab Results  Component Value Date   WBC 4.9 04/07/2019   HGB 8.1 (L) 04/07/2019   HCT 26.6 (L) 04/07/2019   MCV 78.2 (L) 04/07/2019   PLT 238 04/07/2019   Recent Labs    12/19/18 1841  12/20/18 0514 12/21/18 0852 03/04/19 1423 03/30/19 1213 03/31/19 0815  NA 137   < > 139 143 141  --  140  K 6.3*   < > 5.0 3.7 4.6  --  3.6  CL 115*   < > 111 101 110  --  113*  CO2 15*   < > _0 --  18*  GLUCOSE 129*   < > 106* 142* 116*  --  115*  BUN 37*   < > 30* 21  26* 31* 23  CREATININE 2.03*   < > 1.76* 1.77* 1.54* 1.40* 1.38*  CALCIUM 9.7   < > 9.0 8.8* 9.4  --  8.8*  GFRNONAA 24*   < > 29* 29* 34* 38* 39*  GFRAA 28*   < > 34* 33* 39* 44* 45*  PROT  --    < > 6.3*  --  7.9  --  7.0  ALBUMIN  --    < > 3.3*  --  3.7  --  3.5  AST  --    < > 17  --  27  --  28  ALT  --    < > 9  --  17  --  22  ALKPHOS  --    < > 67  --  122  --  108  BILITOT 0.5   < >  0.3  --  0.3  --  0.5  BILIDIR <0.1  --   --   --   --   --   --   IBILI NOT CALCULATED  --   --   --   --   --   --    < > = values in this interval not displayed.   Iron/TIBC/Ferritin/ %Sat    Component Value Date/Time   IRON 46 03/04/2019 1423   IRON 26 (L) 02/04/2019 1309   TIBC 267 03/04/2019 1423   TIBC 251 02/04/2019 1309   FERRITIN 316 (H) 03/04/2019 1423   FERRITIN 409 (H) 02/04/2019 1309   IRONPCTSAT 17 03/04/2019 1423   IRONPCTSAT 10 (L) 02/04/2019 1309    RADIOGRAPHIC STUDIES: I have personally reviewed the radiological images as listed and agreed with the findings in the report. US Venous Img Lower Unilateral Right  Result Date: 03/16/2019 CLINICAL DATA:  68 year old female with persistent right lower extremity swelling and a known right-sided DVT for the past 2 weeks. EXAM: RIGHT LOWER EXTREMITY VENOUS DOPPLER ULTRASOUND TECHNIQUE: Gray-scale sonography with graded compression, as well as color Doppler and duplex ultrasound were performed to evaluate the lower extremity deep venous systems from the level of the common femoral vein and including the common femoral, femoral, profunda femoral, popliteal and calf veins including the posterior tibial, peroneal and gastrocnemius veins when visible. The superficial great saphenous vein was also interrogated. Spectral Doppler was utilized to evaluate flow at rest and with distal augmentation maneuvers in the common femoral, femoral and popliteal veins. COMPARISON:  Recent duplex venous ultrasound 03/06/2019 FINDINGS: Contralateral Common Femoral Vein: Respiratory phasicity is normal and symmetric with the symptomatic side. No evidence of thrombus. Normal compressibility. Common Femoral Vein: Similar appearance of the right common femoral vein which remains only partially compressible with internal eccentric filling defects and only partial flow on color Doppler imaging. The findings remain consistent with nonocclusive DVT. Saphenofemoral  Junction: No evidence of thrombus. Normal compressibility and flow on color Doppler imaging. Profunda Femoral Vein: No evidence of thrombus. Normal compressibility and flow on color Doppler imaging. Femoral Vein: No evidence of thrombus. Normal compressibility, respiratory phasicity and response to augmentation. Popliteal Vein: No evidence of thrombus. Normal compressibility, respiratory phasicity and response to augmentation. Calf Veins: No evidence of thrombus. Normal compressibility and flow on color Doppler imaging. Superficial Great Saphenous Vein: No evidence of thrombus. Normal compressibility. Venous Reflux:  None. Other Findings:  None. IMPRESSION: Persistent and unchanged nonocclusive thrombus in the right common femoral vein. No significant interval improvement compared to 03/06/2019. Electronically Signed   By: Myrle Sheng  Laurence Ferrari M.D.   On: 03/16/2019 16:11   US Venous Img Lower Unilateral Right  Result Date: 03/06/2019 CLINICAL DATA:  Pain and swelling for 1 week EXAM: Right LOWER EXTREMITY VENOUS DOPPLER ULTRASOUND TECHNIQUE: Gray-scale sonography with graded compression, as well as color Doppler and duplex ultrasound were performed to evaluate the lower extremity deep venous systems from the level of the common femoral vein and including the common femoral, femoral, profunda femoral, popliteal and calf veins including the posterior tibial, peroneal and gastrocnemius veins when visible. The superficial great saphenous vein was also interrogated. Spectral Doppler was utilized to evaluate flow at rest and with distal augmentation maneuvers in the common femoral, femoral and popliteal veins. COMPARISON:  None. FINDINGS: Contralateral Common Femoral Vein: Respiratory phasicity is normal and symmetric with the symptomatic side. No evidence of thrombus. Normal compressibility. Common Femoral Vein: Nonocclusive thrombus within the right common femoral vein, extends into the right external iliac vein.  Vessel is noncompressible. Saphenofemoral Junction: Thrombus within the common femoral vein extending to the saphenofemoral junction. Profunda Femoral Vein: No evidence of thrombus. Normal compressibility and flow on color Doppler imaging. Femoral Vein: No evidence of thrombus. Normal compressibility, respiratory phasicity and response to augmentation. Popliteal Vein: No evidence of thrombus. Normal compressibility, respiratory phasicity and response to augmentation. Calf Veins: No evidence of thrombus. Normal compressibility and flow on color Doppler imaging. Other Findings:  Limited evaluation of the IVC shows patency. IMPRESSION: Positive for acute nonocclusive DVT extending from the right external iliac vein to the common femoral vein. Electronically Signed   By: Donavan Foil M.D.   On: 03/06/2019 21:03   Dg Knee Complete 4 Views Left  Result Date: 02/08/2019 CLINICAL DATA:  Knee pain EXAM: LEFT KNEE - COMPLETE 4+ VIEW COMPARISON:  09/02/2012 FINDINGS: Moderate degenerative joint disease changes, most pronounced in the medial and patellofemoral compartments with joint space narrowing and spurring. No acute bony abnormality. Specifically, no fracture, subluxation, or dislocation. No joint effusion. IMPRESSION: Moderate degenerative changes.  No acute bony abnormality. Electronically Signed   By: Rolm Baptise M.D.   On: 02/08/2019 01:37   Vas [place] Lower Extremity Venous (dvt)  Result Date: 03/27/2019  Lower Venous Study Risk Factors: DVT right. Comparison Study: 03/16/2019 Performing Technologist: Concha Norway RVT  Examination Guidelines: A complete evaluation includes B-mode imaging, spectral Doppler, color Doppler, and power Doppler as needed of all accessible portions of each vessel. Bilateral testing is considered an integral part of a complete examination. Limited examinations for reoccurring indications may be performed as noted.   +---------+---------------+---------+-----------+-----------------+------------+  RIGHT     Compressibility Phasicity Spontaneity Properties        Thrombus                                                                         Aging         +---------+---------------+---------+-----------+-----------------+------------+  CFV       Partial         Yes       Yes         partially         Chronic  re-cannalized                   +---------+---------------+---------+-----------+-----------------+------------+  SFJ       Full            Yes       Yes                                         +---------+---------------+---------+-----------+-----------------+------------+  FV Prox   Full                                                                  +---------+---------------+---------+-----------+-----------------+------------+  FV Mid    Full            Yes       Yes                                         +---------+---------------+---------+-----------+-----------------+------------+  FV Distal Full                                                                  +---------+---------------+---------+-----------+-----------------+------------+  PFV       Full            Yes       Yes                                         +---------+---------------+---------+-----------+-----------------+------------+  POP       Full            Yes       Yes                                         +---------+---------------+---------+-----------+-----------------+------------+  PTV       Full            Yes       Yes                                         +---------+---------------+---------+-----------+-----------------+------------+  PERO      Full            Yes       Yes                                         +---------+---------------+---------+-----------+-----------------+------------+  Gastroc   Full                                                                   +---------+---------------+---------+-----------+-----------------+------------+  GSV       Full            Yes       Yes                                         +---------+---------------+---------+-----------+-----------------+------------+  SSV       Full                                                                  +---------+---------------+---------+-----------+-----------------+------------+   +-------+---------------+---------+-----------+----------+--------------+  LEFT    Compressibility Phasicity Spontaneity Properties Thrombus Aging  +-------+---------------+---------+-----------+----------+--------------+  CFV     Full            Yes                                              +-------+---------------+---------+-----------+----------+--------------+  SFJ     Full            Yes                                              +-------+---------------+---------+-----------+----------+--------------+  FV Prox Full            Yes                                              +-------+---------------+---------+-----------+----------+--------------+     Summary: Right: Continued minimal chronic appearing thrombus on the posterior wall of the CFV only. Recannalized flow seen. No DVT found in remainder of lower extremity venous system Left: No evidence of common femoral vein obstruction.  *See table(s) above for measurements and observations. Electronically signed by Leotis Pain MD on 03/27/2019 at 1:17:26 PM.    Final    Patient had PET scan done 03/20/2019 which showed enhancing soft nodule in the cystectomy bed is worrisome for local recurrence. Right greater than left pelvic retroperitoneal and mesenteric lymphadenopathy consistent with nodal metastatic disease Deep vein thrombosis is seen in the right common iliac vein.  The upstream right external iliac vein is severely narrowed by right pelvic sidewall lymphadenopathy and postsurgical changes.  The right lower extremity appears enlarged and  erythematous, likely due to venous insufficiency.  Loculated pelvic fluid collection appears smaller compared to February 09, 2019 scan.  ASSESSMENT & PLAN:  1. Metastatic urothelial carcinoma ([place])   2. Acute deep vein thrombosis (DVT) of femoral vein of right lower extremity (HCC)   3. Goals of care, counseling/discussion   Cancer Staging Bladder carcinoma [facility]) Staging form: Urinary Bladder, AJCC 8th Edition - Clinical stage from 08/01/2018: Stage II (cT2, cN0, cM0) - Signed by [name], MD on 08/03/2018  #Metastatic urothelial carcinoma of bladder, sarcomatoid features pelvic sidewall mass showed metastatic carcinoma consistent with involvement by urothelial carcinoma. Patient tolerated first cycle of Keytruda.  No significant immunotherapy related side effects.  #Right lower extremity provoked femoral vein DVT secondary to transvenous biopsy. We discussed about continuing anticoagulation with Eliquis 5 mg twice daily for 3 to 6 months. Status post IVC filter, will need to removed. Plan repeat right lower extremity ultrasound after she completes anticoagulation. Maintenance therapy depending on ultrasound results and her cancer status. We discussed briefly about maintenance anticoagulation.  #Goals of care, Despite the fact that I had a lengthy discussion with patient during her last visit about patient have metastatic bladder cancer which is an incurable condition, patient appears not understanding the term of metastatic cancer.  I discussed again with her that metastatic disease means the cancer has spread, and her condition is not curable.  She has a stage IV/metastatic bladder cancer. PD-L1 CPS 100%, she has a good response to immunotherapy. Goal of care is to improve her symptoms and to maintain life quality. Patient has an appointment with palliative care service.   All questions were answered. The patient knows to call the clinic with any problems questions or concerns.  Return  of visit: 2 weeks.  We spent sufficient time to discuss many aspect of care, questions were answered to patient's satisfaction. Total face to face encounter time for this patient visit was 25 min. >50% of the time was  spent in counseling and coordination of care.    [name], MD, PhD Hematology Oncology [facility] at [facility] Pager- [phone] 04/07/19

## 2019-04-09 ENCOUNTER — Other Ambulatory Visit: Payer: Self-pay

## 2019-04-10 ENCOUNTER — Inpatient Hospital Stay (HOSPITAL_BASED_OUTPATIENT_CLINIC_OR_DEPARTMENT_OTHER): Payer: Medicare Other | Admitting: Hospice and Palliative Medicine

## 2019-04-10 DIAGNOSIS — C7910 Secondary malignant neoplasm of unspecified urinary organs: Secondary | ICD-10-CM

## 2019-04-10 DIAGNOSIS — Z515 Encounter for palliative care: Secondary | ICD-10-CM

## 2019-04-10 NOTE — Progress Notes (Signed)
Virtual Visit via Telephone Note  I connected with Angela Russell on 04/10/19 at  2:00 PM EST by telephone and verified that I am speaking with the correct person using two identifiers.   I discussed the limitations, risks, security and privacy concerns of performing an evaluation and management service by telephone and the availability of in person appointments. I also discussed with the patient that there may be a patient responsible charge related to this service. The patient expressed understanding and agreed to proceed.   History of Present Illness: Angela Russell is a 68 year old woman with high-grade urothelial carcinoma with sarcomatoid features.  She is status post TURBT.  Her treatment has been complicated by a lower extremity DVT and she is now status post IVC filter and thrombectomy and is being managed on Eliquis.  Patient was referred to palliative care to help discuss goals and manage ongoing symptoms.   Observations/Objective: Called and spoke with patient by phone.  She reports doing well.  She denies any acute changes or concerns today.  She denies any distressing symptoms.  She says that at baseline, patient lives at home alone.  She has 1 son but he does not live nearby.  She is unmarried.  She does have some cousins and nieces who are involved and provide her with social support.  Until diagnosed with cancer, patient was working as a Administrator.  She says that it is her hope that she will be able to return to work at some point in the future.  Patient seems optimistic regarding cancer treatment.  I do note that there has been multiple discussions in the past with Dr. Tasia Catchings about the incurable nature of patient's cancer.  Today, patient does tell me that she knows that her cancer is incurable but she hopes that there will be a response to treatment.  We discussed her emotional coping with the cancer.  She denies any anxiety or depression.  Assessment and Plan: Bladder cancer  -on treatment with Keytruda.  Followed by Dr. Tasia Catchings with next scheduled appointment on 11/17.  We will plan to see patient at that time as well.  Goals of care -patient verbalized an understanding that her cancer is incurable.  She does wish to continue treatment and is hopeful that she will have a positive response.  We will plan to have more in-depth conversations with her regarding her goals.  Hopefully, we can also discuss advance care planning and future medical decision making.  Follow Up Instructions: RTC in 2 weeks   I discussed the assessment and treatment plan with the patient. The patient was provided an opportunity to ask questions and all were answered. The patient agreed with the plan and demonstrated an understanding of the instructions.   The patient was advised to call back or seek an in-person evaluation if the symptoms worsen or if the condition fails to improve as anticipated.  I provided 8 minutes of non-face-to-face time during this encounter.   Irean Hong, NP

## 2019-04-21 ENCOUNTER — Inpatient Hospital Stay (HOSPITAL_BASED_OUTPATIENT_CLINIC_OR_DEPARTMENT_OTHER): Payer: Medicare Other | Admitting: Oncology

## 2019-04-21 ENCOUNTER — Inpatient Hospital Stay: Payer: Medicare Other

## 2019-04-21 ENCOUNTER — Inpatient Hospital Stay (HOSPITAL_BASED_OUTPATIENT_CLINIC_OR_DEPARTMENT_OTHER): Payer: Medicare Other | Admitting: Hospice and Palliative Medicine

## 2019-04-21 ENCOUNTER — Other Ambulatory Visit: Payer: Self-pay

## 2019-04-21 ENCOUNTER — Encounter: Payer: Self-pay | Admitting: Oncology

## 2019-04-21 VITALS — BP 165/75 | HR 86 | Temp 96.6°F | Resp 16 | Wt 186.2 lb

## 2019-04-21 DIAGNOSIS — D631 Anemia in chronic kidney disease: Secondary | ICD-10-CM

## 2019-04-21 DIAGNOSIS — Z5112 Encounter for antineoplastic immunotherapy: Secondary | ICD-10-CM | POA: Diagnosis not present

## 2019-04-21 DIAGNOSIS — I82411 Acute embolism and thrombosis of right femoral vein: Secondary | ICD-10-CM | POA: Diagnosis not present

## 2019-04-21 DIAGNOSIS — E785 Hyperlipidemia, unspecified: Secondary | ICD-10-CM | POA: Diagnosis not present

## 2019-04-21 DIAGNOSIS — D509 Iron deficiency anemia, unspecified: Secondary | ICD-10-CM | POA: Diagnosis not present

## 2019-04-21 DIAGNOSIS — Z515 Encounter for palliative care: Secondary | ICD-10-CM | POA: Diagnosis not present

## 2019-04-21 DIAGNOSIS — N189 Chronic kidney disease, unspecified: Secondary | ICD-10-CM | POA: Diagnosis not present

## 2019-04-21 DIAGNOSIS — N1831 Chronic kidney disease, stage 3a: Secondary | ICD-10-CM | POA: Diagnosis not present

## 2019-04-21 DIAGNOSIS — C7910 Secondary malignant neoplasm of unspecified urinary organs: Secondary | ICD-10-CM

## 2019-04-21 DIAGNOSIS — I129 Hypertensive chronic kidney disease with stage 1 through stage 4 chronic kidney disease, or unspecified chronic kidney disease: Secondary | ICD-10-CM | POA: Diagnosis not present

## 2019-04-21 DIAGNOSIS — E119 Type 2 diabetes mellitus without complications: Secondary | ICD-10-CM | POA: Diagnosis not present

## 2019-04-21 DIAGNOSIS — C679 Malignant neoplasm of bladder, unspecified: Secondary | ICD-10-CM | POA: Diagnosis not present

## 2019-04-21 LAB — CBC WITH DIFFERENTIAL/PLATELET
Abs Immature Granulocytes: 0.02 10*3/uL (ref 0.00–0.07)
Basophils Absolute: 0 10*3/uL (ref 0.0–0.1)
Basophils Relative: 1 %
Eosinophils Absolute: 0.2 10*3/uL (ref 0.0–0.5)
Eosinophils Relative: 3 %
HCT: 30.1 % — ABNORMAL LOW (ref 36.0–46.0)
Hemoglobin: 9.1 g/dL — ABNORMAL LOW (ref 12.0–15.0)
Immature Granulocytes: 0 %
Lymphocytes Relative: 23 %
Lymphs Abs: 1.3 10*3/uL (ref 0.7–4.0)
MCH: 23.8 pg — ABNORMAL LOW (ref 26.0–34.0)
MCHC: 30.2 g/dL (ref 30.0–36.0)
MCV: 78.6 fL — ABNORMAL LOW (ref 80.0–100.0)
Monocytes Absolute: 0.5 10*3/uL (ref 0.1–1.0)
Monocytes Relative: 10 %
Neutro Abs: 3.6 10*3/uL (ref 1.7–7.7)
Neutrophils Relative %: 63 %
Platelets: 263 10*3/uL (ref 150–400)
RBC: 3.83 MIL/uL — ABNORMAL LOW (ref 3.87–5.11)
RDW: 17 % — ABNORMAL HIGH (ref 11.5–15.5)
WBC: 5.6 10*3/uL (ref 4.0–10.5)
nRBC: 0 % (ref 0.0–0.2)

## 2019-04-21 LAB — COMPREHENSIVE METABOLIC PANEL
ALT: 11 U/L (ref 0–44)
AST: 20 U/L (ref 15–41)
Albumin: 4.1 g/dL (ref 3.5–5.0)
Alkaline Phosphatase: 115 U/L (ref 38–126)
Anion gap: 8 (ref 5–15)
BUN: 29 mg/dL — ABNORMAL HIGH (ref 8–23)
CO2: 20 mmol/L — ABNORMAL LOW (ref 22–32)
Calcium: 8.9 mg/dL (ref 8.9–10.3)
Chloride: 109 mmol/L (ref 98–111)
Creatinine, Ser: 1.41 mg/dL — ABNORMAL HIGH (ref 0.44–1.00)
GFR calc Af Amer: 44 mL/min — ABNORMAL LOW (ref >60)
GFR calc non Af Amer: 38 mL/min — ABNORMAL LOW (ref >60)
Glucose, Bld: 116 mg/dL — ABNORMAL HIGH (ref 70–99)
Potassium: 3.8 mmol/L (ref 3.5–5.1)
Sodium: 137 mmol/L (ref 135–145)
Total Bilirubin: 0.5 mg/dL (ref 0.3–1.2)
Total Protein: 7.9 g/dL (ref 6.5–8.1)

## 2019-04-21 MED ORDER — HEPARIN SOD (PORK) LOCK FLUSH 100 UNIT/ML IV SOLN
500.0000 [IU] | Freq: Once | INTRAVENOUS | Status: AC
  Administered 2019-04-21: 500 [IU] via INTRAVENOUS
  Filled 2019-04-21: qty 5

## 2019-04-21 MED ORDER — SODIUM CHLORIDE 0.9% FLUSH
10.0000 mL | INTRAVENOUS | Status: DC | PRN
  Administered 2019-04-21: 10 mL via INTRAVENOUS
  Filled 2019-04-21: qty 10

## 2019-04-21 MED ORDER — SODIUM CHLORIDE 0.9 % IV SOLN
Freq: Once | INTRAVENOUS | Status: AC
  Administered 2019-04-21: 10:00:00 via INTRAVENOUS
  Filled 2019-04-21: qty 250

## 2019-04-21 MED ORDER — SODIUM CHLORIDE 0.9 % IV SOLN
200.0000 mg | Freq: Once | INTRAVENOUS | Status: AC
  Administered 2019-04-21: 200 mg via INTRAVENOUS
  Filled 2019-04-21: qty 8

## 2019-04-21 NOTE — Progress Notes (Signed)
Patient still has right foot/ankle swelling.

## 2019-04-21 NOTE — Progress Notes (Signed)
Hematology/Oncology follow up note [facility] Telephone:([phone]) [phone] Fax:([phone]) [phone]   Patient Care Team: Volney American, PA-C as PCP - General (Family Medicine) Idelle Leech, OD (Optometry)  REFERRING PROVIDER: Dr.Sninsky CHIEF COMPLAINTS/REASON FOR VISIT:  Follow up for bladder cancer, anemia.   HISTORY OF PRESENTING ILLNESS:  Angela Russell is a  68 y.o.  female with PMH listed below who was referred to me for evaluation of newly diagnosed bladder cancer. Patient initially presented to emergency room at the end of January 2020 for evaluation of dysuria, hematuria and the left lower quadrant inguinal pain and flank pain.  1/28 2020 CT renal stone study showed suspected irregular wall thickening about the superior bladder, not well assessed due to degree of bladder distention.  Recommend cystoscopy for further evaluation.  No renal stone or obstructive uropathy.  Patient was given IV Rocephin and referred patient for outpatient urology follow-up.  Urine culture was negative.  She again presented to ER after 2 days with similar symptoms.  Patient has 25-pack-year smoking history, quit approximately 20 years ago.  No family history of any urology malignancies 07/03/2018 another CT abdomen pelvis with contrast was done which showed no nephrolithiasis or hydronephrosis is identified.  Bladder is decompressed limiting evaluation.  07/16/2018.urology Dr. [name] - cystoscopy and bilateral retrograde pyelogram on 07/16/2018.  Pyelogram did not show any filling defect or abnormalities.  No hydronephrosis.  Ureteral orifice was not involved with tumor.  There is a large 5 cm posterior wall bladder tumor, bullous and sessile appearing.  Patient underwent TURBT.   Pathology: High-grade urothelial carcinoma, invasive into muscularis propria.  Lymphovascular invasion is present.  Carcinoma in situ is also identified.  Focal squamous differentiation is noted, areas of  invasive carcinoma display pleomorphic/sarcomatoid changes.  T2b  08/07/2018 CT without contrast negative.  2 subpleural right upper lobe nodule 2 to 3 mm likely benign. She also had baseline audiometry done.  08/04/2018 ddMVAC x 1 cycle, stopped due to intolerance and AKI.  Patient received 1 cycle of dd MVAC, not able to tolerate due to AKI. Patient then was referred to [facility] urology  09/22/2018 patient underwent a cystectomy, pathology pT3a N0 Mx.  11 lymph nodes were harvested and was all negative. Invasive urothelia carcinoma, high grade, with sarcomatoid features.   10/14/2018 patient was admitted due to pyelonephritis and a pelvic fluid collection.  Drain was placed. 10/23/2018 drain was removed. Patient has had difficulties getting to her appointments to [facility]. 02/09/2019, patient presented with abdominal pain. CT concerning for small bowel obstruction and increased size of right pelvic fluid collection concerning for cancer recurrence.  Right hydronephrosis to the level of pelvis and enlarged lymph nodes. Patient had JP drain placed with CT guidance to pelvic fluid collection and drained 400 cc amber fluid.  Culture was negative for growth of microorganisms and cytology was negative for malignancy.-JP drain was removed on the day of discharge. CT-guided core biopsy of pelvic lymph node adenopathy was attempted but not successful.  # Right lower extremity DVT, provoked by Transvenous biopsy  02/26/2019 transvenous biopsy by IR unsuccessful transvenous biopsy of the right pelvis mass. Post biopsy acute thrombus in the right external iliac and common femoral vein.  Patient was recommended to start anticoagulation with Xarelto however due to the co-pay, patient is not able to afford the medication. Anticoagulation regimen was switched to Eliquis 5 mg.  Patient reports that she has been taking it once a day. 03/20/2019 PET showed FDG avid  tissue in the cystectomy bed, retroperitoneal and  pelvic lymphadenopathy.  Right common iliac DVT.  # increased right lower extremity swelling.  She was started on Eliquis for anticoagulation by [facility].  We clarified with her pharmacy and she was actually taking Eliquis 2.5 mg twice daily. Right lower extremity swelling has not improved but instead worsened. 03/16/2019 She had ultrasound right lower extremity done which showed persistent extensive proximal right lower extremity DVT.  Anticoagulation regimen has increased to Eliquis 5 mg twice daily.  # establish care with [facility] oncology Dr. [name] for evaluation.  Dr. [name] recommended starting immunotherapy with PD-L1 inhibitor Pembrolizumab 200 mg every 3 weeks.  The sarcomatoid histology may not respond to chemotherapy well, could portend a better chance of response into immunotherapy # Dr. [name] recommended PET scan in 14 weeks.  # 03/30/2019 Status post IVC filter placement, mechanical thrombectomy. # PD-L1 CPS 100%.  # 03/31/2019 started on immunotherapy Keytruda.   INTERVAL HISTORY Angela Russell is a 68 y.o. female who has above history reviewed by me today presents for follow up visit for evaluation form metastatic high-grade urothelial carcinoma of the bladder. Sarcomatoid feature.  Patient has been on immunotherapy with Keytruda.  Tolerates well. Denies any skin rash, diarrhea, fatigue, fever, chills.  Right lower extremity provoked DVT, on Eliquis 5 mg twice daily.  Status post IVC filter placement and mechanical thrombectomy She reports the swelling continues to improve.  She only has trace edema at the ankle level. No pain  Anemia in CKD, fatigue level is at baseline.  Denies any shortness of breath with exertion Patient's appetite is good and weight is stable. Patient desires to go back to work.  She is a Administrator.  Reports that she is feeling so well that she does not want to stay at home and do nothing anymore.  Review of Systems  Constitutional: Negative for  appetite change, chills, fatigue and fever.  HENT:   Negative for hearing loss and voice change.   Eyes: Negative for eye problems.  Respiratory: Negative for chest tightness and cough.   Cardiovascular: Negative for chest pain and leg swelling.  Gastrointestinal: Negative for abdominal distention, abdominal pain and blood in stool.  Endocrine: Negative for hot flashes.  Genitourinary: Negative for difficulty urinating and frequency.   Musculoskeletal: Negative for arthralgias.  Skin: Negative for itching and rash.  Neurological: Negative for extremity weakness.  Hematological: Negative for adenopathy.  Psychiatric/Behavioral: Negative for confusion. The patient is not nervous/anxious.     MEDICAL HISTORY:  Past Medical History:  Diagnosis Date   Carotid artery plaque, right 01/2014   CKD (chronic kidney disease)    stage 2-3   Diabetes mellitus without complication (HCC)    DM (diabetes mellitus), type 2, uncontrolled (HCC)    Hyperlipidemia    Hypertension    Hypochromic microcytic anemia    mild   Metastatic urothelial carcinoma ([place]) 03/23/2019   Osteoporosis    Renal insufficiency    Rotator cuff tendonitis, right     SURGICAL HISTORY: Past Surgical History:  Procedure Laterality Date   ABDOMINAL HYSTERECTOMY  2000   due to bleeding and fibroids, partial- still has ovaries   CYSTOSCOPY W/ RETROGRADES Bilateral 07/16/2018   Procedure: CYSTOSCOPY WITH RETROGRADE PYELOGRAM;  Surgeon: Billey Co, MD;  Location: [facility];  Service: Urology;  Laterality: Bilateral;   IVC FILTER INSERTION N/A 03/30/2019   Procedure: IVC FILTER INSERTION;  Surgeon: Algernon Huxley, MD;  Location: [facility];  Service: Cardiovascular;  Laterality: N/A;   KNEE SURGERY Left 03/17/2013   torn meniscus   PERIPHERAL VASCULAR THROMBECTOMY Right 03/30/2019   Procedure: PERIPHERAL VASCULAR THROMBECTOMY;  Surgeon: Algernon Huxley, MD;  Location: [facility];  Service:  Cardiovascular;  Laterality: Right;   PORTA CATH INSERTION N/A 08/06/2018   Procedure: PORTA CATH INSERTION;  Surgeon: Algernon Huxley, MD;  Location: [facility];  Service: Cardiovascular;  Laterality: N/A;   TRANSURETHRAL RESECTION OF BLADDER TUMOR N/A 07/16/2018   Procedure: TRANSURETHRAL RESECTION OF BLADDER TUMOR (TURBT);  Surgeon: Billey Co, MD;  Location: [facility];  Service: Urology;  Laterality: N/A;    SOCIAL HISTORY: Social History   Socioeconomic History   Marital status: Single    Spouse name: Not on file   Number of children: 1   Years of education: Not on file   Highest education level: Not on file  Occupational History   Not on file  Social Needs   Financial resource strain: Not hard at all   Food insecurity    Worry: Never true    Inability: Never true   Transportation needs    Medical: No    Non-medical: No  Tobacco Use   Smoking status: Former Smoker    Quit date: 06/05/1991    Years since quitting: 27.8   Smokeless tobacco: Never Used  Substance and Sexual Activity   Alcohol use: No   Drug use: No   Sexual activity: Never  Lifestyle   Physical activity    Days per week: 2 days    Minutes per session: 30 min   Stress: Not at all  Relationships   Social connections    Talks on phone: More than three times a week    Gets together: More than three times a week    Attends religious service: More than 4 times per year    Active member of club or organization: Yes    Attends meetings of clubs or organizations: More than 4 times per year    Relationship status: Separated   Intimate partner violence    Fear of current or ex partner: No    Emotionally abused: No    Physically abused: No    Forced sexual activity: No  Other Topics Concern   Not on file  Social History Narrative   Working full time    FAMILY HISTORY: Family History  Problem Relation Age of Onset   Heart disease Mother    Heart attack Mother     Arthritis Father    Diabetes Brother     ALLERGIES:  has No Known Allergies.  MEDICATIONS:  Current Outpatient Medications  Medication Sig Dispense Refill   amLODipine (NORVASC) 5 MG tablet Take 1 tablet (5 mg total) by mouth daily. 90 tablet 1   apixaban (ELIQUIS) 5 MG TABS tablet Take 1 tablet (5 mg total) by mouth 2 (two) times daily. 60 tablet 3   diclofenac sodium (VOLTAREN) 1 % GEL Apply 2 g topically 4 (four) times daily. 100 g 2   polyethylene glycol (MIRALAX / GLYCOLAX) 17 g packet Take 17 g by mouth daily as needed for mild constipation. 14 each 0   pravastatin (PRAVACHOL) 10 MG tablet Take 1 tablet (10 mg total) by mouth at bedtime. 90 tablet 1   senna (SENOKOT) 8.6 MG TABS tablet Take 2 tablets (17.2 mg total) by mouth daily. 60 tablet 0   lidocaine-prilocaine (EMLA) cream Apply to affected area once 30  g 3   No current facility-administered medications for this visit.      PHYSICAL EXAMINATION: ECOG PERFORMANCE STATUS: 1 - Symptomatic but completely ambulatory Vitals:   04/21/19 0906  BP: (!) 165/75  Pulse: 86  Resp: 16  Temp: (!) 96.6 F (35.9 C)   Filed Weights   04/21/19 0906  Weight: 186 lb 3.2 oz (84.5 kg)    Physical Exam Constitutional:      General: She is not in acute distress.    Comments: Walk independently  HENT:     Head: Normocephalic and atraumatic.  Eyes:     General: No scleral icterus.    Pupils: Pupils are equal, round, and reactive to light.  Neck:     Musculoskeletal: Normal range of motion and neck supple.  Cardiovascular:     Rate and Rhythm: Normal rate and regular rhythm.     Heart sounds: Normal heart sounds.  Pulmonary:     Effort: Pulmonary effort is normal. No respiratory distress.     Breath sounds: No wheezing.  Abdominal:     General: Bowel sounds are normal. There is no distension.     Palpations: Abdomen is soft. There is no mass.     Tenderness: There is no abdominal tenderness.  Musculoskeletal: Normal  range of motion.        General: No deformity.     Comments: Trace right lower extremity edema  Skin:    General: Skin is warm and dry.     Coloration: Skin is not pale.     Findings: No erythema or rash.  Neurological:     Mental Status: She is alert and oriented to person, place, and time.     Cranial Nerves: No cranial nerve deficit.     Coordination: Coordination normal.  Psychiatric:        Behavior: Behavior normal.        Thought Content: Thought content normal.     RADIOGRAPHIC STUDIES: I have personally reviewed the radiological images as listed and agreed with the findings in the report.  CMP Latest Ref Rng & Units 04/21/2019  Glucose 70 - 99 mg/dL 116(H)  BUN 8 - 23 mg/dL 29(H)  Creatinine 0.44 - 1.00 mg/dL 1.41(H)  Sodium 135 - 145 mmol/L 137  Potassium 3.5 - 5.1 mmol/L 3.8  Chloride 98 - 111 mmol/L 109  CO2 22 - 32 mmol/L 20(L)  Calcium 8.9 - 10.3 mg/dL 8.9  Total Protein 6.5 - 8.1 g/dL 7.9  Total Bilirubin 0.3 - 1.2 mg/dL 0.5  Alkaline Phos 38 - 126 U/L 115  AST 15 - 41 U/L 20  ALT 0 - 44 U/L 11   CBC Latest Ref Rng & Units 04/21/2019  WBC 4.0 - 10.5 K/uL 5.6  Hemoglobin 12.0 - 15.0 g/dL 9.1(L)  Hematocrit 36.0 - 46.0 % 30.1(L)  Platelets 150 - 400 K/uL 263    LABORATORY DATA:  I have reviewed the data as listed Lab Results  Component Value Date   WBC 5.6 04/21/2019   HGB 9.1 (L) 04/21/2019   HCT 30.1 (L) 04/21/2019   MCV 78.6 (L) 04/21/2019   PLT 263 04/21/2019   Recent Labs    12/19/18 1841  03/04/19 1423 03/30/19 1213 03/31/19 0815 04/21/19 0842  NA 137   < > 141  --  140 137  K 6.3*   < > 4.6  --  3.6 3.8  CL 115*   < > 110  --  113* 109  CO2  15*   < > 23  --  18* 20*  GLUCOSE 129*   < > 116*  --  115* 116*  BUN 37*   < > 26* 31* 23 29*  CREATININE 2.03*   < > 1.54* 1.40* 1.38* 1.41*  CALCIUM 9.7   < > 9.4  --  8.8* 8.9  GFRNONAA 24*   < > 34* 38* 39* 38*  GFRAA 28*   < > 39* 44* 45* 44*  PROT  --    < > 7.9  --  7.0 7.9  ALBUMIN   --    < > 3.7  --  3.5 4.1  AST  --    < > 27  --  28 20  ALT  --    < > 17  --  22 11  ALKPHOS  --    < > 122  --  108 115  BILITOT 0.5   < > 0.3  --  0.5 0.5  BILIDIR <0.1  --   --   --   --   --   IBILI NOT CALCULATED  --   --   --   --   --    < > = values in this interval not displayed.   Iron/TIBC/Ferritin/ %Sat    Component Value Date/Time   IRON 46 03/04/2019 1423   IRON 26 (L) 02/04/2019 1309   TIBC 267 03/04/2019 1423   TIBC 251 02/04/2019 1309   FERRITIN 316 (H) 03/04/2019 1423   FERRITIN 409 (H) 02/04/2019 1309   IRONPCTSAT 17 03/04/2019 1423   IRONPCTSAT 10 (L) 02/04/2019 1309    RADIOGRAPHIC STUDIES: I have personally reviewed the radiological images as listed and agreed with the findings in the report. US Venous Img Lower Unilateral Right  Result Date: 03/16/2019 CLINICAL DATA:  68 year old female with persistent right lower extremity swelling and a known right-sided DVT for the past 2 weeks. EXAM: RIGHT LOWER EXTREMITY VENOUS DOPPLER ULTRASOUND TECHNIQUE: Gray-scale sonography with graded compression, as well as color Doppler and duplex ultrasound were performed to evaluate the lower extremity deep venous systems from the level of the common femoral vein and including the common femoral, femoral, profunda femoral, popliteal and calf veins including the posterior tibial, peroneal and gastrocnemius veins when visible. The superficial great saphenous vein was also interrogated. Spectral Doppler was utilized to evaluate flow at rest and with distal augmentation maneuvers in the common femoral, femoral and popliteal veins. COMPARISON:  Recent duplex venous ultrasound 03/06/2019 FINDINGS: Contralateral Common Femoral Vein: Respiratory phasicity is normal and symmetric with the symptomatic side. No evidence of thrombus. Normal compressibility. Common Femoral Vein: Similar appearance of the right common femoral vein which remains only partially compressible with internal eccentric  filling defects and only partial flow on color Doppler imaging. The findings remain consistent with nonocclusive DVT. Saphenofemoral Junction: No evidence of thrombus. Normal compressibility and flow on color Doppler imaging. Profunda Femoral Vein: No evidence of thrombus. Normal compressibility and flow on color Doppler imaging. Femoral Vein: No evidence of thrombus. Normal compressibility, respiratory phasicity and response to augmentation. Popliteal Vein: No evidence of thrombus. Normal compressibility, respiratory phasicity and response to augmentation. Calf Veins: No evidence of thrombus. Normal compressibility and flow on color Doppler imaging. Superficial Great Saphenous Vein: No evidence of thrombus. Normal compressibility. Venous Reflux:  None. Other Findings:  None. IMPRESSION: Persistent and unchanged nonocclusive thrombus in the right common femoral vein. No significant interval improvement compared to 03/06/2019. Electronically Signed  By: Jacqulynn Cadet M.D.   On: 03/16/2019 16:11   US Venous Img Lower Unilateral Right  Result Date: 03/06/2019 CLINICAL DATA:  Pain and swelling for 1 week EXAM: Right LOWER EXTREMITY VENOUS DOPPLER ULTRASOUND TECHNIQUE: Gray-scale sonography with graded compression, as well as color Doppler and duplex ultrasound were performed to evaluate the lower extremity deep venous systems from the level of the common femoral vein and including the common femoral, femoral, profunda femoral, popliteal and calf veins including the posterior tibial, peroneal and gastrocnemius veins when visible. The superficial great saphenous vein was also interrogated. Spectral Doppler was utilized to evaluate flow at rest and with distal augmentation maneuvers in the common femoral, femoral and popliteal veins. COMPARISON:  None. FINDINGS: Contralateral Common Femoral Vein: Respiratory phasicity is normal and symmetric with the symptomatic side. No evidence of thrombus. Normal  compressibility. Common Femoral Vein: Nonocclusive thrombus within the right common femoral vein, extends into the right external iliac vein. Vessel is noncompressible. Saphenofemoral Junction: Thrombus within the common femoral vein extending to the saphenofemoral junction. Profunda Femoral Vein: No evidence of thrombus. Normal compressibility and flow on color Doppler imaging. Femoral Vein: No evidence of thrombus. Normal compressibility, respiratory phasicity and response to augmentation. Popliteal Vein: No evidence of thrombus. Normal compressibility, respiratory phasicity and response to augmentation. Calf Veins: No evidence of thrombus. Normal compressibility and flow on color Doppler imaging. Other Findings:  Limited evaluation of the IVC shows patency. IMPRESSION: Positive for acute nonocclusive DVT extending from the right external iliac vein to the common femoral vein. Electronically Signed   By: Donavan Foil M.D.   On: 03/06/2019 21:03   Dg Knee Complete 4 Views Left  Result Date: 02/08/2019 CLINICAL DATA:  Knee pain EXAM: LEFT KNEE - COMPLETE 4+ VIEW COMPARISON:  09/02/2012 FINDINGS: Moderate degenerative joint disease changes, most pronounced in the medial and patellofemoral compartments with joint space narrowing and spurring. No acute bony abnormality. Specifically, no fracture, subluxation, or dislocation. No joint effusion. IMPRESSION: Moderate degenerative changes.  No acute bony abnormality. Electronically Signed   By: Rolm Baptise M.D.   On: 02/08/2019 01:37   Vas [place] Lower Extremity Venous (dvt)  Result Date: 03/27/2019  Lower Venous Study Risk Factors: DVT right. Comparison Study: 03/16/2019 Performing Technologist: Concha Norway RVT  Examination Guidelines: A complete evaluation includes B-mode imaging, spectral Doppler, color Doppler, and power Doppler as needed of all accessible portions of each vessel. Bilateral testing is considered an integral part of a complete examination.  Limited examinations for reoccurring indications may be performed as noted.  +---------+---------------+---------+-----------+-----------------+------------+  RIGHT     Compressibility Phasicity Spontaneity Properties        Thrombus                                                                         Aging         +---------+---------------+---------+-----------+-----------------+------------+  CFV       Partial         Yes       Yes         partially         Chronic  re-cannalized                   +---------+---------------+---------+-----------+-----------------+------------+  SFJ       Full            Yes       Yes                                         +---------+---------------+---------+-----------+-----------------+------------+  FV Prox   Full                                                                  +---------+---------------+---------+-----------+-----------------+------------+  FV Mid    Full            Yes       Yes                                         +---------+---------------+---------+-----------+-----------------+------------+  FV Distal Full                                                                  +---------+---------------+---------+-----------+-----------------+------------+  PFV       Full            Yes       Yes                                         +---------+---------------+---------+-----------+-----------------+------------+  POP       Full            Yes       Yes                                         +---------+---------------+---------+-----------+-----------------+------------+  PTV       Full            Yes       Yes                                         +---------+---------------+---------+-----------+-----------------+------------+  PERO      Full            Yes       Yes                                         +---------+---------------+---------+-----------+-----------------+------------+  Gastroc    Full                                                                  +---------+---------------+---------+-----------+-----------------+------------+  GSV       Full            Yes       Yes                                         +---------+---------------+---------+-----------+-----------------+------------+  SSV       Full                                                                  +---------+---------------+---------+-----------+-----------------+------------+   +-------+---------------+---------+-----------+----------+--------------+  LEFT    Compressibility Phasicity Spontaneity Properties Thrombus Aging  +-------+---------------+---------+-----------+----------+--------------+  CFV     Full            Yes                                              +-------+---------------+---------+-----------+----------+--------------+  SFJ     Full            Yes                                              +-------+---------------+---------+-----------+----------+--------------+  FV Prox Full            Yes                                              +-------+---------------+---------+-----------+----------+--------------+     Summary: Right: Continued minimal chronic appearing thrombus on the posterior wall of the CFV only. Recannalized flow seen. No DVT found in remainder of lower extremity venous system Left: No evidence of common femoral vein obstruction.  *See table(s) above for measurements and observations. Electronically signed by Leotis Pain MD on 03/27/2019 at 1:17:26 PM.    Final    Patient had PET scan done 03/20/2019 which showed enhancing soft nodule in the cystectomy bed is worrisome for local recurrence. Right greater than left pelvic retroperitoneal and mesenteric lymphadenopathy consistent with nodal metastatic disease Deep vein thrombosis is seen in the right common iliac vein.  The upstream right external iliac vein is severely narrowed by right pelvic sidewall lymphadenopathy and  postsurgical changes.  The right lower extremity appears enlarged and erythematous, likely due to venous insufficiency.  Loculated pelvic fluid collection appears smaller compared to February 09, 2019 scan.  ASSESSMENT & PLAN:  1. Metastatic urothelial carcinoma ([place])   2. Acute deep vein thrombosis (DVT) of femoral vein of right lower extremity (HCC)   3. Microcytic anemia   4. Anemia in stage 3a chronic kidney disease   5. Encounter for antineoplastic immunotherapy   Cancer Staging Bladder carcinoma [facility]) Staging form: Urinary Bladder, AJCC 8th Edition - Clinical stage from 08/01/2018: Stage IVA (cTX, cN3, cM1a) - Signed by [name], MD on 04/21/2019  #Metastatic urothelial carcinoma of bladder, sarcomatoid features pelvic sidewall mass showed  metastatic carcinoma consistent with involvement by urothelial carcinoma. Labs reviewed and discussed with patient. Counts acceptable to proceed with cycle 2 Keytruda treatment. She tolerates well with no significant immunotherapy toxicities. We will plan to obtain repeat PET imaging scans after 4 cycles of Keytruda.- Patient feels well and desires to go back to work.  We will provide her return to work letter today.  We discussed about precautions and alarming symptoms to watch for.  #Right lower extremity provoked femoral vein DVT secondary to transvenous biopsy. Continue anticoagulation with Eliquis 5 mg twice daily. IVC filter will need to be removed in the future. Plan repeat right lower extremity ultrasound around March 2021 Maintenance therapy depending on ultrasound results and her cancer status.  #Anemia in CKD.Marland Kitchen  Hemoglobin 9.1.  Recent iron panel shows ferritin level 216.  Iron saturation 17.  TIBC 267.  Continue to monitor.  Erythropoietin therapy is contraindicated due to active cancer.  #Goals of care, palliative. Follow-up with palliative service.   All questions were answered. The patient knows to call the clinic with any  problems questions or concerns.  Return of visit: 3 weeks.    [name], MD, PhD Hematology [facility] at [facility] Pager- [phone] 04/21/19

## 2019-04-21 NOTE — Progress Notes (Signed)
[place]  [phone] [phone]   Name: Angela Russell Date: 04/21/2019 MRN: [id number]  DOB: 1950/10/05  Patient Care Team: Volney American, PA-C as PCP - General (Family Medicine) Idelle Leech, OD (Optometry)    REASON FOR CONSULTATION: Angela Russell is a 68 year old woman with high-grade urothelial carcinoma with sarcomatoid features.  She is status post TURBT.  Her treatment has been complicated by a lower extremity DVT and she is now status post IVC filter and thrombectomy and is being managed on Eliquis.  Patient was referred to palliative care to help discuss goals and manage ongoing symptoms.   SOCIAL HISTORY:     reports that she quit smoking about 27 years ago. She has never used smokeless tobacco. She reports that she does not drink alcohol or use drugs.   Patient lives at home alone.  She has a son but he does not live nearby.  She is unmarried.  She does have some cousins and nieces who are involved and provide her with social support.  Until diagnosed with cancer, patient was working as a Administrator.  She says that it is her hope that she will be able to return to work at some point in the future.  ADVANCE DIRECTIVES:  Does not have  CODE STATUS:   PAST MEDICAL HISTORY: Past Medical History:  Diagnosis Date  . Carotid artery plaque, right 01/2014  . CKD (chronic kidney disease)    stage 2-3  . Diabetes mellitus without complication ([place])   . DM (diabetes mellitus), type 2, uncontrolled ([place])   . Hyperlipidemia   . Hypertension   . Hypochromic microcytic anemia    mild  . Metastatic urothelial carcinoma ([place]) 03/23/2019  . Osteoporosis   . Renal insufficiency   . Rotator cuff tendonitis, right     PAST SURGICAL HISTORY:  Past Surgical History:  Procedure Laterality Date  . ABDOMINAL HYSTERECTOMY  2000   due to bleeding and fibroids, partial- still has ovaries  . CYSTOSCOPY  W/ RETROGRADES Bilateral 07/16/2018   Procedure: CYSTOSCOPY WITH RETROGRADE PYELOGRAM;  Surgeon: Billey Co, MD;  Location: [facility] OR[facility];  Service: Urology;  Laterality: Bilateral;  . IVC FILTER INSERTION N/A 03/30/2019   Procedure: IVC FILTER INSERTION;  Surgeon: Algernon Huxley, MD;  Location: [place] [facility];  Service: Cardiovascular;  Laterality: N/A;  . KNEE SURGERY Left 03/17/2013   torn meniscus  . PERIPHERAL VASCULAR THROMBECTOMY Right 03/30/2019   Procedure: PERIPHERAL VASCULAR THROMBECTOMY;  Surgeon: Algernon Huxley, MD;  Location: [place] [facility];  Service: Cardiovascular;  Laterality: Right;  . PORTA CATH INSERTION N/A 08/06/2018   Procedure: PORTA CATH INSERTION;  Surgeon: Algernon Huxley, MD;  Location: [place] [facility];  Service: Cardiovascular;  Laterality: N/A;  . TRANSURETHRAL RESECTION OF BLADDER TUMOR N/A 07/16/2018   Procedure: TRANSURETHRAL RESECTION OF BLADDER TUMOR (TURBT);  Surgeon: Billey Co, MD;  Location: [facility] OR[facility];  Service: Urology;  Laterality: N/A;    HEMATOLOGY/ONCOLOGY HISTORY:  Oncology History  Bladder carcinoma ([place])  08/01/2018 Cancer Staging   Staging form: Urinary Bladder, AJCC 8th Edition - Clinical stage from 08/01/2018: cT2, cN0, pM1 - Signed by [name], MD on 04/21/2019   08/03/2018 Initial Diagnosis   Bladder carcinoma ([place])   08/12/2018 - 08/25/2018 Chemotherapy   The patient had DOXOrubicin (ADRIAMYCIN) chemo injection 62 mg, 30 mg/m2 = 62 mg, Intravenous,  Once, 1 of 4 cycles Administration: 62 mg (  08/13/2018) palonosetron (ALOXI) injection 0.25 mg, 0.25 mg, Intravenous,  Once, 1 of 4 cycles Administration: 0.25 mg (08/13/2018), 0.25 mg (08/14/2018) pegfilgrastim (NEULASTA ONPRO KIT) injection 6 mg, 6 mg, Subcutaneous, Once, 1 of 4 cycles Administration: 6 mg (08/14/2018) methotrexate (PF) 61.25 mg in sodium chloride 0.9 % 50 mL chemo infusion, 30 mg/m2 = 61.25 mg, Intravenous,  Once, 1 of 4 cycles Administration: 61.25 mg (08/12/2018)  CISplatin (PLATINOL) 71 mg in sodium chloride 0.9 % 250 mL chemo infusion, 35 mg/m2 = 71 mg (100 % of original dose 35 mg/m2), Intravenous,  Once, 1 of 4 cycles Dose modification: 35 mg/m2 (original dose 35 mg/m2, Cycle 1, Reason: Provider Judgment) Administration: 71 mg (08/13/2018), 71 mg (08/14/2018) vinBLAStine (VELBAN) 6.1 mg in sodium chloride 0.9 % 50 mL chemo infusion, 3 mg/m2 = 6.1 mg, Intravenous, Once, 1 of 4 cycles Administration: 6.1 mg (08/13/2018) fosaprepitant (EMEND) 150 mg, dexamethasone (DECADRON) 12 mg in sodium chloride 0.9 % 145 mL IVPB, , Intravenous,  Once, 1 of 4 cycles Administration:  (08/13/2018),  (08/14/2018)  for chemotherapy treatment.    04/21/2019 Cancer Staging   Staging form: Urinary Bladder, AJCC 8th Edition - Pathologic: cM1 - Signed by [name], MD on 04/21/2019   Metastatic urothelial carcinoma ([place])  03/23/2019 Initial Diagnosis   Metastatic urothelial carcinoma ([place])   03/31/2019 -  Chemotherapy   The patient had pembrolizumab (KEYTRUDA) 200 mg in sodium chloride 0.9 % 50 mL chemo infusion, 200 mg, Intravenous, Once, 2 of 6 cycles Administration: 200 mg (03/31/2019), 200 mg (04/21/2019)  for chemotherapy treatment.      ALLERGIES:  has No Known Allergies.  MEDICATIONS:  Current Outpatient Medications  Medication Sig Dispense Refill  . amLODipine (NORVASC) 5 MG tablet Take 1 tablet (5 mg total) by mouth daily. 90 tablet 1  . apixaban (ELIQUIS) 5 MG TABS tablet Take 1 tablet (5 mg total) by mouth 2 (two) times daily. 60 tablet 3  . diclofenac sodium (VOLTAREN) 1 % GEL Apply 2 g topically 4 (four) times daily. 100 g 2  . lidocaine-prilocaine (EMLA) cream Apply to affected area once 30 g 3  . polyethylene glycol (MIRALAX / GLYCOLAX) 17 g packet Take 17 g by mouth daily as needed for mild constipation. 14 each 0  . pravastatin (PRAVACHOL) 10 MG tablet Take 1 tablet (10 mg total) by mouth at bedtime. 90 tablet 1  . senna (SENOKOT) 8.6 MG TABS tablet  Take 2 tablets (17.2 mg total) by mouth daily. 60 tablet 0   No current facility-administered medications for this visit.     VITAL SIGNS: There were no vitals taken for this visit. There were no vitals filed for this visit.  Estimated body mass index is 32.98 kg/m as calculated from the following:   Height as of 03/30/19: _0  (1.6 m).   Weight as of an earlier encounter on 04/21/19: 186 lb 3.2 oz (84.5 kg).  LABS: CBC:    Component Value Date/Time   WBC 5.6 04/21/2019 0842   HGB 9.1 (L) 04/21/2019 0842   HGB 10.7 (L) 01/11/2017 1529   HCT 30.1 (L) 04/21/2019 0842   HCT 34.4 01/11/2017 1529   PLT 263 04/21/2019 0842   PLT 292 01/11/2017 1529   MCV 78.6 (L) 04/21/2019 0842   MCV 77 (L) 01/11/2017 1529   NEUTROABS 3.6 04/21/2019 0842   NEUTROABS 3.2 01/11/2017 1529   LYMPHSABS 1.3 04/21/2019 0842   LYMPHSABS 1.9 01/11/2017 1529   MONOABS 0.5 04/21/2019 5809  EOSABS 0.2 04/21/2019 0842   EOSABS 0.1 01/11/2017 1529   BASOSABS 0.0 04/21/2019 0842   BASOSABS 0.0 01/11/2017 1529   Comprehensive Metabolic Panel:    Component Value Date/Time   NA 137 04/21/2019 0842   NA 139 12/18/2018 1740   K 3.8 04/21/2019 0842   CL 109 04/21/2019 0842   CO2 20 (L) 04/21/2019 0842   BUN 29 (H) 04/21/2019 0842   BUN 38 (H) 12/18/2018 1740   CREATININE 1.41 (H) 04/21/2019 0842   GLUCOSE 116 (H) 04/21/2019 0842   CALCIUM 8.9 04/21/2019 0842   AST 20 04/21/2019 0842   ALT 11 04/21/2019 0842   ALKPHOS 115 04/21/2019 0842   BILITOT 0.5 04/21/2019 0842   BILITOT 0.2 12/18/2018 1740   PROT 7.9 04/21/2019 0842   PROT 7.5 12/18/2018 1740   ALBUMIN 4.1 04/21/2019 0842   ALBUMIN 4.5 12/18/2018 1740    RADIOGRAPHIC STUDIES: Vas [place] Lower Extremity Venous (dvt)  Result Date: 03/27/2019  Lower Venous Study Risk Factors: DVT right. Comparison Study: 03/16/2019 Performing Technologist: Concha Norway RVT  Examination Guidelines: A complete evaluation includes B-mode imaging, spectral Doppler,  color Doppler, and power Doppler as needed of all accessible portions of each vessel. Bilateral testing is considered an integral part of a complete examination. Limited examinations for reoccurring indications may be performed as noted.  +---------+---------------+---------+-----------+-----------------+------------+ RIGHT    CompressibilityPhasicitySpontaneityProperties       Thrombus                                                                  Aging        +---------+---------------+---------+-----------+-----------------+------------+ CFV      Partial        Yes      Yes        partially        Chronic                                                  re-cannalized                 +---------+---------------+---------+-----------+-----------------+------------+ SFJ      Full           Yes      Yes                                      +---------+---------------+---------+-----------+-----------------+------------+ FV Prox  Full                                                             +---------+---------------+---------+-----------+-----------------+------------+ FV Mid   Full           Yes      Yes                                      +---------+---------------+---------+-----------+-----------------+------------+  FV DistalFull                                                             +---------+---------------+---------+-----------+-----------------+------------+ PFV      Full           Yes      Yes                                      +---------+---------------+---------+-----------+-----------------+------------+ POP      Full           Yes      Yes                                      +---------+---------------+---------+-----------+-----------------+------------+ PTV      Full           Yes      Yes                                      +---------+---------------+---------+-----------+-----------------+------------+ PERO      Full           Yes      Yes                                      +---------+---------------+---------+-----------+-----------------+------------+ Gastroc  Full                                                             +---------+---------------+---------+-----------+-----------------+------------+ GSV      Full           Yes      Yes                                      +---------+---------------+---------+-----------+-----------------+------------+ SSV      Full                                                             +---------+---------------+---------+-----------+-----------------+------------+   +-------+---------------+---------+-----------+----------+--------------+ LEFT   CompressibilityPhasicitySpontaneityPropertiesThrombus Aging +-------+---------------+---------+-----------+----------+--------------+ CFV    Full           Yes                                          +-------+---------------+---------+-----------+----------+--------------+ SFJ    Full           Yes                                          +-------+---------------+---------+-----------+----------+--------------+  FV ProxFull           Yes                                          +-------+---------------+---------+-----------+----------+--------------+     Summary: Right: Continued minimal chronic appearing thrombus on the posterior wall of the CFV only. Recannalized flow seen. No DVT found in remainder of lower extremity venous system Left: No evidence of common femoral vein obstruction.  *See table(s) above for measurements and observations. Electronically signed by Leotis Pain MD on 03/27/2019 at 1:17:26 PM.    Final     PERFORMANCE STATUS (ECOG) : 1 - Symptomatic but completely ambulatory  Review of Systems Unless otherwise noted, a complete review of systems is negative.  Physical Exam General: NAD, frail appearing, thin Pulmonary: unlabored Extremities: no edema, no  joint deformities Skin: no rashes Neurological: Weakness but otherwise nonfocal  IMPRESSION: Routine follow-up visit made today.  Patient was seen while she was in infusion receiving treatment.  Patient reports that she is doing well.  She denies any acute changes or concerns today.  She denies any distressing symptoms.  She reports that appetite is good.  Weight appears stable at 186 pounds.  Patient says she is coping well with her disease and treatment so far.  We will plan to have more in-depth conversation with patient regarding goals and hopefully complete advance care directives when patient can be seen in a private exam room.  PLAN: -Continue current scope of treatment -We will need to discuss ACP and MOST Form -RTC in 3 weeks   Patient expressed understanding and was in agreement with this plan. She also understands that She can call the clinic at any time with any questions, concerns, or complaints.     Time Total: 15 minutes  Visit consisted of counseling and education dealing with the complex and emotionally intense issues of symptom management and palliative care in the setting of serious and potentially life-threatening illness.Greater than 50%  of this time was spent counseling and coordinating care related to the above assessment and plan.  Signed by: Altha Harm, PhD, NP-C

## 2019-04-24 ENCOUNTER — Other Ambulatory Visit (INDEPENDENT_AMBULATORY_CARE_PROVIDER_SITE_OTHER): Payer: Self-pay | Admitting: Vascular Surgery

## 2019-04-24 DIAGNOSIS — Z95828 Presence of other vascular implants and grafts: Secondary | ICD-10-CM

## 2019-04-24 DIAGNOSIS — I82401 Acute embolism and thrombosis of unspecified deep veins of right lower extremity: Secondary | ICD-10-CM

## 2019-04-28 ENCOUNTER — Encounter (INDEPENDENT_AMBULATORY_CARE_PROVIDER_SITE_OTHER): Payer: Medicare Other

## 2019-04-28 ENCOUNTER — Ambulatory Visit (INDEPENDENT_AMBULATORY_CARE_PROVIDER_SITE_OTHER): Payer: Medicare Other | Admitting: Vascular Surgery

## 2019-05-04 ENCOUNTER — Other Ambulatory Visit: Payer: Self-pay | Admitting: *Deleted

## 2019-05-04 MED ORDER — APIXABAN 5 MG PO TABS: 5.0000 mg | ORAL_TABLET | Freq: Two times a day (BID) | ORAL | 3 refills | Status: DC

## 2019-05-12 ENCOUNTER — Inpatient Hospital Stay (HOSPITAL_BASED_OUTPATIENT_CLINIC_OR_DEPARTMENT_OTHER): Payer: Medicare Other | Admitting: Oncology

## 2019-05-12 ENCOUNTER — Inpatient Hospital Stay: Payer: Medicare Other

## 2019-05-12 ENCOUNTER — Other Ambulatory Visit: Payer: Self-pay

## 2019-05-12 ENCOUNTER — Inpatient Hospital Stay (HOSPITAL_BASED_OUTPATIENT_CLINIC_OR_DEPARTMENT_OTHER): Payer: Medicare Other | Admitting: Hospice and Palliative Medicine

## 2019-05-12 ENCOUNTER — Encounter: Payer: Self-pay | Admitting: Oncology

## 2019-05-12 ENCOUNTER — Inpatient Hospital Stay: Payer: Medicare Other | Attending: Oncology

## 2019-05-12 VITALS — BP 168/77 | HR 86 | Temp 97.1°F | Resp 16 | Wt 188.0 lb

## 2019-05-12 DIAGNOSIS — Z5112 Encounter for antineoplastic immunotherapy: Secondary | ICD-10-CM | POA: Diagnosis not present

## 2019-05-12 DIAGNOSIS — C679 Malignant neoplasm of bladder, unspecified: Secondary | ICD-10-CM | POA: Insufficient documentation

## 2019-05-12 DIAGNOSIS — R5383 Other fatigue: Secondary | ICD-10-CM | POA: Insufficient documentation

## 2019-05-12 DIAGNOSIS — C7910 Secondary malignant neoplasm of unspecified urinary organs: Secondary | ICD-10-CM

## 2019-05-12 DIAGNOSIS — Z515 Encounter for palliative care: Secondary | ICD-10-CM

## 2019-05-12 DIAGNOSIS — Z7901 Long term (current) use of anticoagulants: Secondary | ICD-10-CM | POA: Diagnosis not present

## 2019-05-12 DIAGNOSIS — Z79899 Other long term (current) drug therapy: Secondary | ICD-10-CM | POA: Diagnosis not present

## 2019-05-12 DIAGNOSIS — E1165 Type 2 diabetes mellitus with hyperglycemia: Secondary | ICD-10-CM | POA: Diagnosis not present

## 2019-05-12 DIAGNOSIS — Z87891 Personal history of nicotine dependence: Secondary | ICD-10-CM | POA: Diagnosis not present

## 2019-05-12 DIAGNOSIS — D631 Anemia in chronic kidney disease: Secondary | ICD-10-CM | POA: Diagnosis not present

## 2019-05-12 DIAGNOSIS — I129 Hypertensive chronic kidney disease with stage 1 through stage 4 chronic kidney disease, or unspecified chronic kidney disease: Secondary | ICD-10-CM | POA: Diagnosis not present

## 2019-05-12 DIAGNOSIS — N183 Chronic kidney disease, stage 3 unspecified: Secondary | ICD-10-CM | POA: Diagnosis not present

## 2019-05-12 DIAGNOSIS — E785 Hyperlipidemia, unspecified: Secondary | ICD-10-CM | POA: Diagnosis not present

## 2019-05-12 DIAGNOSIS — R600 Localized edema: Secondary | ICD-10-CM | POA: Diagnosis not present

## 2019-05-12 DIAGNOSIS — I82411 Acute embolism and thrombosis of right femoral vein: Secondary | ICD-10-CM | POA: Diagnosis not present

## 2019-05-12 DIAGNOSIS — Z5111 Encounter for antineoplastic chemotherapy: Secondary | ICD-10-CM | POA: Diagnosis present

## 2019-05-12 DIAGNOSIS — N1831 Chronic kidney disease, stage 3a: Secondary | ICD-10-CM | POA: Diagnosis not present

## 2019-05-12 DIAGNOSIS — M810 Age-related osteoporosis without current pathological fracture: Secondary | ICD-10-CM | POA: Diagnosis not present

## 2019-05-12 DIAGNOSIS — Z86718 Personal history of other venous thrombosis and embolism: Secondary | ICD-10-CM | POA: Diagnosis not present

## 2019-05-12 LAB — COMPREHENSIVE METABOLIC PANEL
ALT: 12 U/L (ref 0–44)
AST: 20 U/L (ref 15–41)
Albumin: 3.8 g/dL (ref 3.5–5.0)
Alkaline Phosphatase: 99 U/L (ref 38–126)
Anion gap: 10 (ref 5–15)
BUN: 27 mg/dL — ABNORMAL HIGH (ref 8–23)
CO2: 20 mmol/L — ABNORMAL LOW (ref 22–32)
Calcium: 9 mg/dL (ref 8.9–10.3)
Chloride: 108 mmol/L (ref 98–111)
Creatinine, Ser: 1.41 mg/dL — ABNORMAL HIGH (ref 0.44–1.00)
GFR calc Af Amer: 44 mL/min — ABNORMAL LOW (ref >60)
GFR calc non Af Amer: 38 mL/min — ABNORMAL LOW (ref >60)
Glucose, Bld: 121 mg/dL — ABNORMAL HIGH (ref 70–99)
Potassium: 3.8 mmol/L (ref 3.5–5.1)
Sodium: 138 mmol/L (ref 135–145)
Total Bilirubin: 0.5 mg/dL (ref 0.3–1.2)
Total Protein: 7.5 g/dL (ref 6.5–8.1)

## 2019-05-12 LAB — CBC WITH DIFFERENTIAL/PLATELET
Abs Immature Granulocytes: 0.01 10*3/uL (ref 0.00–0.07)
Basophils Absolute: 0 10*3/uL (ref 0.0–0.1)
Basophils Relative: 1 %
Eosinophils Absolute: 0.1 10*3/uL (ref 0.0–0.5)
Eosinophils Relative: 2 %
HCT: 30.9 % — ABNORMAL LOW (ref 36.0–46.0)
Hemoglobin: 9.3 g/dL — ABNORMAL LOW (ref 12.0–15.0)
Immature Granulocytes: 0 %
Lymphocytes Relative: 26 %
Lymphs Abs: 1.1 10*3/uL (ref 0.7–4.0)
MCH: 23.7 pg — ABNORMAL LOW (ref 26.0–34.0)
MCHC: 30.1 g/dL (ref 30.0–36.0)
MCV: 78.8 fL — ABNORMAL LOW (ref 80.0–100.0)
Monocytes Absolute: 0.4 10*3/uL (ref 0.1–1.0)
Monocytes Relative: 9 %
Neutro Abs: 2.6 10*3/uL (ref 1.7–7.7)
Neutrophils Relative %: 62 %
Platelets: 234 10*3/uL (ref 150–400)
RBC: 3.92 MIL/uL (ref 3.87–5.11)
RDW: 16.5 % — ABNORMAL HIGH (ref 11.5–15.5)
WBC: 4.2 10*3/uL (ref 4.0–10.5)
nRBC: 0 % (ref 0.0–0.2)

## 2019-05-12 LAB — TSH: TSH: 2.074 u[IU]/mL (ref 0.350–4.500)

## 2019-05-12 MED ORDER — SODIUM CHLORIDE 0.9 % IV SOLN
Freq: Once | INTRAVENOUS | Status: AC
  Administered 2019-05-12: 10:00:00 via INTRAVENOUS
  Filled 2019-05-12: qty 250

## 2019-05-12 MED ORDER — SODIUM CHLORIDE 0.9 % IV SOLN
200.0000 mg | Freq: Once | INTRAVENOUS | Status: AC
  Administered 2019-05-12: 200 mg via INTRAVENOUS
  Filled 2019-05-12: qty 8

## 2019-05-12 MED ORDER — HEPARIN SOD (PORK) LOCK FLUSH 100 UNIT/ML IV SOLN
500.0000 [IU] | Freq: Once | INTRAVENOUS | Status: AC | PRN
  Administered 2019-05-12: 500 [IU]
  Filled 2019-05-12: qty 5

## 2019-05-12 NOTE — Progress Notes (Signed)
Patient reports right ankle swelling.

## 2019-05-12 NOTE — Progress Notes (Signed)
Hematology/Oncology follow up note [facility] Telephone:([phone]) [phone] Fax:([phone]) [phone]   Patient Care Team: Volney American, PA-C as PCP - General (Family Medicine) Idelle Leech, OD (Optometry)  REFERRING PROVIDER: Dr.Sninsky CHIEF COMPLAINTS/REASON FOR VISIT:  Follow up for bladder cancer, anemia.   HISTORY OF PRESENTING ILLNESS:  Angela Russell is a  68 y.o.  female with PMH listed below who was referred to me for evaluation of newly diagnosed bladder cancer. Patient initially presented to emergency room at the end of January 2020 for evaluation of dysuria, hematuria and the left lower quadrant inguinal pain and flank pain.  1/28 2020 CT renal stone study showed suspected irregular wall thickening about the superior bladder, not well assessed due to degree of bladder distention.  Recommend cystoscopy for further evaluation.  No renal stone or obstructive uropathy.  Patient was given IV Rocephin and referred patient for outpatient urology follow-up.  Urine culture was negative.  She again presented to ER after 2 days with similar symptoms.  Patient has 25-pack-year smoking history, quit approximately 20 years ago.  No family history of any urology malignancies 07/03/2018 another CT abdomen pelvis with contrast was done which showed no nephrolithiasis or hydronephrosis is identified.  Bladder is decompressed limiting evaluation.  07/16/2018.urology Dr. [name] - cystoscopy and bilateral retrograde pyelogram on 07/16/2018.  Pyelogram did not show any filling defect or abnormalities.  No hydronephrosis.  Ureteral orifice was not involved with tumor.  There is a large 5 cm posterior wall bladder tumor, bullous and sessile appearing.  Patient underwent TURBT.   Pathology: High-grade urothelial carcinoma, invasive into muscularis propria.  Lymphovascular invasion is present.  Carcinoma in situ is also identified.  Focal squamous differentiation is noted, areas of  invasive carcinoma display pleomorphic/sarcomatoid changes.  T2b  08/07/2018 CT without contrast negative.  2 subpleural right upper lobe nodule 2 to 3 mm likely benign. She also had baseline audiometry done.  08/04/2018 ddMVAC x 1 cycle, stopped due to intolerance and AKI.  Patient received 1 cycle of dd MVAC, not able to tolerate due to AKI. Patient then was referred to [facility] urology  09/22/2018 patient underwent a cystectomy, pathology pT3a N0 Mx.  11 lymph nodes were harvested and was all negative. Invasive urothelia carcinoma, high grade, with sarcomatoid features.   10/14/2018 patient was admitted due to pyelonephritis and a pelvic fluid collection.  Drain was placed. 10/23/2018 drain was removed. Patient has had difficulties getting to her appointments to [facility]. 02/09/2019, patient presented with abdominal pain. CT concerning for small bowel obstruction and increased size of right pelvic fluid collection concerning for cancer recurrence.  Right hydronephrosis to the level of pelvis and enlarged lymph nodes. Patient had JP drain placed with CT guidance to pelvic fluid collection and drained 400 cc amber fluid.  Culture was negative for growth of microorganisms and cytology was negative for malignancy.-JP drain was removed on the day of discharge. CT-guided core biopsy of pelvic lymph node adenopathy was attempted but not successful.  # Right lower extremity DVT, provoked by Transvenous biopsy  02/26/2019 transvenous biopsy by IR unsuccessful transvenous biopsy of the right pelvis mass. Post biopsy acute thrombus in the right external iliac and common femoral vein.  Patient was recommended to start anticoagulation with Xarelto however due to the co-pay, patient is not able to afford the medication. Anticoagulation regimen was switched to Eliquis 5 mg.  Patient reports that she has been taking it once a day. 03/20/2019 PET showed FDG avid  tissue in the cystectomy bed, retroperitoneal and  pelvic lymphadenopathy.  Right common iliac DVT.  # increased right lower extremity swelling.  She was started on Eliquis for anticoagulation by [facility].  We clarified with her pharmacy and she was actually taking Eliquis 2.5 mg twice daily. Right lower extremity swelling has not improved but instead worsened. 03/16/2019 She had ultrasound right lower extremity done which showed persistent extensive proximal right lower extremity DVT.  Anticoagulation regimen has increased to Eliquis 5 mg twice daily.  # establish care with [facility] oncology Dr. [name] for evaluation.  Dr. [name] recommended starting immunotherapy with PD-L1 inhibitor Pembrolizumab 200 mg every 3 weeks.  The sarcomatoid histology may not respond to chemotherapy well, could portend a better chance of response into immunotherapy # Dr. [name] recommended PET scan in 14 weeks.  # 03/30/2019 Status post IVC filter placement, mechanical thrombectomy. # PD-L1 CPS 100%.  # 03/31/2019 started on immunotherapy Keytruda.   INTERVAL HISTORY Angela Russell is a 68 y.o. female who has above history reviewed by me today presents for follow up visit for evaluation form metastatic high-grade urothelial carcinoma of the bladder. Sarcomatoid feature.  Patient has been doing well on immunotherapy with Keytruda.  Denies any new complaints.  No skin rash, diarrhea, fatigue, fever, chills  Right lower extremity provoked DVT, on Eliquis 5 mg twice daily.  Status post IVC filter placement and mechanical thrombectomy She has some right ankle swelling, which is better in the morning, worst at night.   She reports the swelling continues to improve.  She only has trace edema at the ankle level. No pain Patient has gone back to work.  She is a Administrator.  Anemia in CKD, fatigue is at baseline. Appetite is good.  Weight has been stable.  Review of Systems  Constitutional: Negative for appetite change, chills, fatigue and fever.  HENT:    Negative for hearing loss and voice change.   Eyes: Negative for eye problems.  Respiratory: Negative for chest tightness and cough.   Cardiovascular: Positive for leg swelling. Negative for chest pain.  Gastrointestinal: Negative for abdominal distention, abdominal pain and blood in stool.  Endocrine: Negative for hot flashes.  Genitourinary: Negative for difficulty urinating and frequency.   Musculoskeletal: Negative for arthralgias.  Skin: Negative for itching and rash.  Neurological: Negative for extremity weakness.  Hematological: Negative for adenopathy.  Psychiatric/Behavioral: Negative for confusion. The patient is not nervous/anxious.     MEDICAL HISTORY:  Past Medical History:  Diagnosis Date   Carotid artery plaque, right 01/2014   CKD (chronic kidney disease)    stage 2-3   Diabetes mellitus without complication (HCC)    DM (diabetes mellitus), type 2, uncontrolled (HCC)    Hyperlipidemia    Hypertension    Hypochromic microcytic anemia    mild   Metastatic urothelial carcinoma ([place]) 03/23/2019   Osteoporosis    Renal insufficiency    Rotator cuff tendonitis, right     SURGICAL HISTORY: Past Surgical History:  Procedure Laterality Date   ABDOMINAL HYSTERECTOMY  2000   due to bleeding and fibroids, partial- still has ovaries   CYSTOSCOPY W/ RETROGRADES Bilateral 07/16/2018   Procedure: CYSTOSCOPY WITH RETROGRADE PYELOGRAM;  Surgeon: Billey Co, MD;  Location: [facility] ORS;  Service: Urology;  Laterality: Bilateral;   IVC FILTER INSERTION N/A 03/30/2019   Procedure: IVC FILTER INSERTION;  Surgeon: Algernon Huxley, MD;  Location: [place] [facility];  Service: Cardiovascular;  Laterality: N/A;   KNEE  SURGERY Left 03/17/2013   torn meniscus   PERIPHERAL VASCULAR THROMBECTOMY Right 03/30/2019   Procedure: PERIPHERAL VASCULAR THROMBECTOMY;  Surgeon: Algernon Huxley, MD;  Location: [place] [facility];  Service: Cardiovascular;  Laterality: Right;    PORTA CATH INSERTION N/A 08/06/2018   Procedure: PORTA CATH INSERTION;  Surgeon: Algernon Huxley, MD;  Location: [place] [facility];  Service: Cardiovascular;  Laterality: N/A;   TRANSURETHRAL RESECTION OF BLADDER TUMOR N/A 07/16/2018   Procedure: TRANSURETHRAL RESECTION OF BLADDER TUMOR (TURBT);  Surgeon: Billey Co, MD;  Location: [facility] OR[facility];  Service: Urology;  Laterality: N/A;    SOCIAL HISTORY: Social History   Socioeconomic History   Marital status: Single    Spouse name: Not on file   Number of children: 1   Years of education: Not on file   Highest education level: Not on file  Occupational History   Not on file  Social Needs   Financial resource strain: Not hard at all   Food insecurity    Worry: Never true    Inability: Never true   Transportation needs    Medical: No    Non-medical: No  Tobacco Use   Smoking status: Former Smoker    Quit date: 06/05/1991    Years since quitting: 27.9   Smokeless tobacco: Never Used  Substance and Sexual Activity   Alcohol use: No   Drug use: No   Sexual activity: Never  Lifestyle   Physical activity    Days per week: 2 days    Minutes per session: 30 min   Stress: Not at all  Relationships   Social connections    Talks on phone: More than three times a week    Gets together: More than three times a week    Attends religious service: More than 4 times per year    Active member of club or organization: Yes    Attends meetings of clubs or organizations: More than 4 times per year    Relationship status: Separated   Intimate partner violence    Fear of current or ex partner: No    Emotionally abused: No    Physically abused: No    Forced sexual activity: No  Other Topics Concern   Not on file  Social History Narrative   Working full time    FAMILY HISTORY: Family History  Problem Relation Age of Onset   Heart disease Mother    Heart attack Mother    Arthritis Father    Diabetes Brother      ALLERGIES:  has No Known Allergies.  MEDICATIONS:  Current Outpatient Medications  Medication Sig Dispense Refill   amLODipine (NORVASC) 5 MG tablet Take 1 tablet (5 mg total) by mouth daily. 90 tablet 1   apixaban (ELIQUIS) 5 MG TABS tablet Take 1 tablet (5 mg total) by mouth 2 (two) times daily. 60 tablet 3   diclofenac sodium (VOLTAREN) 1 % GEL Apply 2 g topically 4 (four) times daily. 100 g 2   lidocaine-prilocaine (EMLA) cream Apply to affected area once 30 g 3   polyethylene glycol (MIRALAX / GLYCOLAX) 17 g packet Take 17 g by mouth daily as needed for mild constipation. 14 each 0   pravastatin (PRAVACHOL) 10 MG tablet Take 1 tablet (10 mg total) by mouth at bedtime. 90 tablet 1   senna (SENOKOT) 8.6 MG TABS tablet Take 2 tablets (17.2 mg total) by mouth daily. 60 tablet 0   No current facility-administered medications  for this visit.    Facility-Administered Medications Ordered in Other Visits  Medication Dose Route Frequency Provider Last Rate Last Dose   0.9 %  sodium chloride infusion   Intravenous Once [name], MD       heparin lock flush 100 unit/mL  500 Units Intracatheter Once PRN [name], MD       pembrolizumab [facility]) 200 mg in sodium chloride 0.9 % 50 mL chemo infusion  200 mg Intravenous Once [name], MD         PHYSICAL EXAMINATION: ECOG PERFORMANCE STATUS: 1 - Symptomatic but completely ambulatory Vitals:   05/12/19 0856  BP: (!) 168/77  Pulse: 86  Resp: 16  Temp: (!) 97.1 F (36.2 C)  SpO2: 98%   Filed Weights   05/12/19 0856  Weight: 188 lb (85.3 kg)    Physical Exam Constitutional:      General: She is not in acute distress.    Comments: Walk independently  HENT:     Head: Normocephalic and atraumatic.  Eyes:     General: No scleral icterus.    Pupils: Pupils are equal, round, and reactive to light.  Neck:     Musculoskeletal: Normal range of motion and neck supple.  Cardiovascular:     Rate and Rhythm: Normal rate and  regular rhythm.     Heart sounds: Normal heart sounds.  Pulmonary:     Effort: Pulmonary effort is normal. No respiratory distress.     Breath sounds: No wheezing.  Abdominal:     General: Bowel sounds are normal. There is no distension.     Palpations: Abdomen is soft. There is no mass.     Tenderness: There is no abdominal tenderness.  Musculoskeletal: Normal range of motion.        General: No deformity.     Comments: Trace right lower extremity edema  Skin:    General: Skin is warm and dry.     Coloration: Skin is not pale.     Findings: No erythema or rash.  Neurological:     Mental Status: She is alert and oriented to person, place, and time.     Cranial Nerves: No cranial nerve deficit.     Coordination: Coordination normal.  Psychiatric:        Behavior: Behavior normal.        Thought Content: Thought content normal.     RADIOGRAPHIC STUDIES: I have personally reviewed the radiological images as listed and agreed with the findings in the report.  CMP Latest Ref Rng & Units 05/12/2019  Glucose 70 - 99 mg/dL 121(H)  BUN 8 - 23 mg/dL 27(H)  Creatinine 0.44 - 1.00 mg/dL 1.41(H)  Sodium 135 - 145 mmol/L 138  Potassium 3.5 - 5.1 mmol/L 3.8  Chloride 98 - 111 mmol/L 108  CO2 22 - 32 mmol/L 20(L)  Calcium 8.9 - 10.3 mg/dL 9.0  Total Protein 6.5 - 8.1 g/dL 7.5  Total Bilirubin 0.3 - 1.2 mg/dL 0.5  Alkaline Phos 38 - 126 U/L 99  AST 15 - 41 U/L 20  ALT 0 - 44 U/L 12   CBC Latest Ref Rng & Units 05/12/2019  WBC 4.0 - 10.5 K/uL 4.2  Hemoglobin 12.0 - 15.0 g/dL 9.3(L)  Hematocrit 36.0 - 46.0 % 30.9(L)  Platelets 150 - 400 K/uL 234    LABORATORY DATA:  I have reviewed the data as listed Lab Results  Component Value Date   WBC 4.2 05/12/2019   HGB 9.3 (L)  05/12/2019   HCT 30.9 (L) 05/12/2019   MCV 78.8 (L) 05/12/2019   PLT 234 05/12/2019   Recent Labs    12/19/18 1841  03/31/19 0815 04/21/19 0842 05/12/19 0828  NA 137   < > 140 137 138  K 6.3*   < > 3.6 3.8  3.8  CL 115*   < > 113* 109 108  CO2 15*   < > 18* 20* 20*  GLUCOSE 129*   < > 115* 116* 121*  BUN 37*   < > 23 29* 27*  CREATININE 2.03*   < > 1.38* 1.41* 1.41*  CALCIUM 9.7   < > 8.8* 8.9 9.0  GFRNONAA 24*   < > 39* 38* 38*  GFRAA 28*   < > 45* 44* 44*  PROT  --    < > 7.0 7.9 7.5  ALBUMIN  --    < > 3.5 4.1 3.8  AST  --    < > _0 ALT  --    < > _1 ALKPHOS  --    < > 108 115 99  BILITOT 0.5   < > 0.5 0.5 0.5  BILIDIR <0.1  --   --   --   --   IBILI NOT CALCULATED  --   --   --   --    < > = values in this interval not displayed.   Iron/TIBC/Ferritin/ %Sat    Component Value Date/Time   IRON 46 03/04/2019 1423   IRON 26 (L) 02/04/2019 1309   TIBC 267 03/04/2019 1423   TIBC 251 02/04/2019 1309   FERRITIN 316 (H) 03/04/2019 1423   FERRITIN 409 (H) 02/04/2019 1309   IRONPCTSAT 17 03/04/2019 1423   IRONPCTSAT 10 (L) 02/04/2019 1309    RADIOGRAPHIC STUDIES: I have personally reviewed the radiological images as listed and agreed with the findings in the report. US Venous Img Lower Unilateral Right  Result Date: 03/16/2019 CLINICAL DATA:  68 year old female with persistent right lower extremity swelling and a known right-sided DVT for the past 2 weeks. EXAM: RIGHT LOWER EXTREMITY VENOUS DOPPLER ULTRASOUND TECHNIQUE: Gray-scale sonography with graded compression, as well as color Doppler and duplex ultrasound were performed to evaluate the lower extremity deep venous systems from the level of the common femoral vein and including the common femoral, femoral, profunda femoral, popliteal and calf veins including the posterior tibial, peroneal and gastrocnemius veins when visible. The superficial great saphenous vein was also interrogated. Spectral Doppler was utilized to evaluate flow at rest and with distal augmentation maneuvers in the common femoral, femoral and popliteal veins. COMPARISON:  Recent duplex venous ultrasound 03/06/2019 FINDINGS: Contralateral Common Femoral  Vein: Respiratory phasicity is normal and symmetric with the symptomatic side. No evidence of thrombus. Normal compressibility. Common Femoral Vein: Similar appearance of the right common femoral vein which remains only partially compressible with internal eccentric filling defects and only partial flow on color Doppler imaging. The findings remain consistent with nonocclusive DVT. Saphenofemoral Junction: No evidence of thrombus. Normal compressibility and flow on color Doppler imaging. Profunda Femoral Vein: No evidence of thrombus. Normal compressibility and flow on color Doppler imaging. Femoral Vein: No evidence of thrombus. Normal compressibility, respiratory phasicity and response to augmentation. Popliteal Vein: No evidence of thrombus. Normal compressibility, respiratory phasicity and response to augmentation. Calf Veins: No evidence of thrombus. Normal compressibility and flow on color Doppler imaging. Superficial Great Saphenous Vein: No evidence of thrombus. Normal compressibility. Venous  Reflux:  None. Other Findings:  None. IMPRESSION: Persistent and unchanged nonocclusive thrombus in the right common femoral vein. No significant interval improvement compared to 03/06/2019. Electronically Signed   By: Jacqulynn Cadet M.D.   On: 03/16/2019 16:11   US Venous Img Lower Unilateral Right  Result Date: 03/06/2019 CLINICAL DATA:  Pain and swelling for 1 week EXAM: Right LOWER EXTREMITY VENOUS DOPPLER ULTRASOUND TECHNIQUE: Gray-scale sonography with graded compression, as well as color Doppler and duplex ultrasound were performed to evaluate the lower extremity deep venous systems from the level of the common femoral vein and including the common femoral, femoral, profunda femoral, popliteal and calf veins including the posterior tibial, peroneal and gastrocnemius veins when visible. The superficial great saphenous vein was also interrogated. Spectral Doppler was utilized to evaluate flow at rest and  with distal augmentation maneuvers in the common femoral, femoral and popliteal veins. COMPARISON:  None. FINDINGS: Contralateral Common Femoral Vein: Respiratory phasicity is normal and symmetric with the symptomatic side. No evidence of thrombus. Normal compressibility. Common Femoral Vein: Nonocclusive thrombus within the right common femoral vein, extends into the right external iliac vein. Vessel is noncompressible. Saphenofemoral Junction: Thrombus within the common femoral vein extending to the saphenofemoral junction. Profunda Femoral Vein: No evidence of thrombus. Normal compressibility and flow on color Doppler imaging. Femoral Vein: No evidence of thrombus. Normal compressibility, respiratory phasicity and response to augmentation. Popliteal Vein: No evidence of thrombus. Normal compressibility, respiratory phasicity and response to augmentation. Calf Veins: No evidence of thrombus. Normal compressibility and flow on color Doppler imaging. Other Findings:  Limited evaluation of the IVC shows patency. IMPRESSION: Positive for acute nonocclusive DVT extending from the right external iliac vein to the common femoral vein. Electronically Signed   By: Donavan Foil M.D.   On: 03/06/2019 21:03   Vas [place] Lower Extremity Venous (dvt)  Result Date: 03/27/2019  Lower Venous Study Risk Factors: DVT right. Comparison Study: 03/16/2019 Performing Technologist: Concha Norway RVT  Examination Guidelines: A complete evaluation includes B-mode imaging, spectral Doppler, color Doppler, and power Doppler as needed of all accessible portions of each vessel. Bilateral testing is considered an integral part of a complete examination. Limited examinations for reoccurring indications may be performed as noted.  +---------+---------------+---------+-----------+-----------------+------------+  RIGHT     Compressibility Phasicity Spontaneity Properties        Thrombus                                                                          Aging         +---------+---------------+---------+-----------+-----------------+------------+  CFV       Partial         Yes       Yes         partially         Chronic                                                        re-cannalized                   +---------+---------------+---------+-----------+-----------------+------------+  SFJ       Full            Yes       Yes                                         +---------+---------------+---------+-----------+-----------------+------------+  FV Prox   Full                                                                  +---------+---------------+---------+-----------+-----------------+------------+  FV Mid    Full            Yes       Yes                                         +---------+---------------+---------+-----------+-----------------+------------+  FV Distal Full                                                                  +---------+---------------+---------+-----------+-----------------+------------+  PFV       Full            Yes       Yes                                         +---------+---------------+---------+-----------+-----------------+------------+  POP       Full            Yes       Yes                                         +---------+---------------+---------+-----------+-----------------+------------+  PTV       Full            Yes       Yes                                         +---------+---------------+---------+-----------+-----------------+------------+  PERO      Full            Yes       Yes                                         +---------+---------------+---------+-----------+-----------------+------------+  Gastroc   Full                                                                  +---------+---------------+---------+-----------+-----------------+------------+  GSV       Full            Yes       Yes                                          +---------+---------------+---------+-----------+-----------------+------------+  SSV       Full                                                                  +---------+---------------+---------+-----------+-----------------+------------+   +-------+---------------+---------+-----------+----------+--------------+  LEFT    Compressibility Phasicity Spontaneity Properties Thrombus Aging  +-------+---------------+---------+-----------+----------+--------------+  CFV     Full            Yes                                              +-------+---------------+---------+-----------+----------+--------------+  SFJ     Full            Yes                                              +-------+---------------+---------+-----------+----------+--------------+  FV Prox Full            Yes                                              +-------+---------------+---------+-----------+----------+--------------+     Summary: Right: Continued minimal chronic appearing thrombus on the posterior wall of the CFV only. Recannalized flow seen. No DVT found in remainder of lower extremity venous system Left: No evidence of common femoral vein obstruction.  *See table(s) above for measurements and observations. Electronically signed by Leotis Pain MD on 03/27/2019 at 1:17:26 PM.    Final    Patient had PET scan done 03/20/2019 which showed enhancing soft nodule in the cystectomy bed is worrisome for local recurrence. Right greater than left pelvic retroperitoneal and mesenteric lymphadenopathy consistent with nodal metastatic disease Deep vein thrombosis is seen in the right common iliac vein.  The upstream right external iliac vein is severely narrowed by right pelvic sidewall lymphadenopathy and postsurgical changes.  The right lower extremity appears enlarged and erythematous, likely due to venous insufficiency.  Loculated pelvic fluid collection appears smaller compared to February 09, 2019 scan.  ASSESSMENT & PLAN:  1.  Metastatic urothelial carcinoma ([place])   2. Palliative care encounter   3. Acute deep vein thrombosis (DVT) of femoral vein of right lower extremity (HCC)   4. Anemia in stage 3a chronic kidney disease   5. Encounter for antineoplastic immunotherapy   Cancer Staging Bladder carcinoma [facility]) Staging form: Urinary Bladder, AJCC 8th Edition - Clinical stage from 08/01/2018: Stage IVA (cTX, cN3, cM1a) - Signed by [name], MD on 04/21/2019  #Metastatic urothelial carcinoma of bladder, sarcomatoid features pelvic sidewall  mass showed metastatic carcinoma consistent with involvement by urothelial carcinoma. Labs are reviewed and discussed with patient. Counts are acceptable to proceed with cycle 3 Keytruda treatment. She tolerates so far very well without any significant immunotherapy toxicities. PET scan after 4 cycles of Keytruda.  #Right lower extremity provoked femoral vein DVT secondary to transvenous biopsy. Continue Eliquis 5 mg twice daily. IVC filter will need to be removed in the future.  Encourage patient to follow-up with vascular surgeon. Plan repeat right lower extremity ultrasound around March 2021 Maintenance therapy depending on ultrasound results and her cancer status.  #Anemia in CKD.Marland Kitchen  Hemoglobin 9.3.  Continue to monitor.  Erythropoietin therapy is contraindicated due to active cancer.  #Goals of care, palliative. Follow-up with palliative service.  All questions were answered. The patient knows to call the clinic with any problems questions or concerns.  Return of visit: 3 weeks.    [name], MD, PhD Hematology Oncology [facility] Pager- [phone] 05/12/19

## 2019-05-12 NOTE — Progress Notes (Signed)
[place]  [phone] [phone]   Name: TAYLR MEUTH Date: 05/12/2019 MRN: [id number]  DOB: 1950-06-06  Patient Care Team: Volney American, PA-C as PCP - General (Family Medicine) Idelle Leech, OD (Optometry)    REASON FOR CONSULTATION: [name] is a 68 year old woman with high-grade urothelial carcinoma with sarcomatoid features.  She is status post TURBT.  Her treatment has been complicated by a lower extremity DVT and she is now status post IVC filter and thrombectomy and is being managed on Eliquis.  Patient was referred to palliative care to help discuss goals and manage ongoing symptoms.   SOCIAL HISTORY:     reports that she quit smoking about 27 years ago. She has never used smokeless tobacco. She reports that she does not drink alcohol or use drugs.   Patient lives at home alone.  She has a son but he does not live nearby.  She is unmarried.  She does have some cousins and nieces who are involved and provide her with social support.  Until diagnosed with cancer, patient was working as a Administrator.  She says that it is her hope that she will be able to return to work at some point in the future.  ADVANCE DIRECTIVES:  Does not have  CODE STATUS:   PAST MEDICAL HISTORY: Past Medical History:  Diagnosis Date  . Carotid artery plaque, right 01/2014  . CKD (chronic kidney disease)    stage 2-3  . Diabetes mellitus without complication ([place])   . DM (diabetes mellitus), type 2, uncontrolled ([place])   . Hyperlipidemia   . Hypertension   . Hypochromic microcytic anemia    mild  . Metastatic urothelial carcinoma ([place]) 03/23/2019  . Osteoporosis   . Renal insufficiency   . Rotator cuff tendonitis, right     PAST SURGICAL HISTORY:  Past Surgical History:  Procedure Laterality Date  . ABDOMINAL HYSTERECTOMY  2000   due to bleeding and fibroids, partial- still has ovaries  . CYSTOSCOPY  W/ RETROGRADES Bilateral 07/16/2018   Procedure: CYSTOSCOPY WITH RETROGRADE PYELOGRAM;  Surgeon: Billey Co, MD;  Location: [facility] ORS;  Service: Urology;  Laterality: Bilateral;  . IVC FILTER INSERTION N/A 03/30/2019   Procedure: IVC FILTER INSERTION;  Surgeon: Algernon Huxley, MD;  Location: [place] [facility];  Service: Cardiovascular;  Laterality: N/A;  . KNEE SURGERY Left 03/17/2013   torn meniscus  . PERIPHERAL VASCULAR THROMBECTOMY Right 03/30/2019   Procedure: PERIPHERAL VASCULAR THROMBECTOMY;  Surgeon: Algernon Huxley, MD;  Location: [place] [facility];  Service: Cardiovascular;  Laterality: Right;  . PORTA CATH INSERTION N/A 08/06/2018   Procedure: PORTA CATH INSERTION;  Surgeon: Algernon Huxley, MD;  Location: [place] [facility];  Service: Cardiovascular;  Laterality: N/A;  . TRANSURETHRAL RESECTION OF BLADDER TUMOR N/A 07/16/2018   Procedure: TRANSURETHRAL RESECTION OF BLADDER TUMOR (TURBT);  Surgeon: Billey Co, MD;  Location: [facility] ORS;  Service: Urology;  Laterality: N/A;    HEMATOLOGY/ONCOLOGY HISTORY:  Oncology History  Bladder carcinoma ([place])  08/01/2018 Cancer Staging   Staging form: Urinary Bladder, AJCC 8th Edition - Clinical stage from 08/01/2018: Stage IVA (cTX, cN3, cM1a) - Signed by [name], MD on 04/21/2019   08/03/2018 Initial Diagnosis   Bladder carcinoma ([place])   08/12/2018 - 08/25/2018 Chemotherapy   The patient had DOXOrubicin (ADRIAMYCIN) chemo injection 62 mg, 30 mg/m2 = 62 mg, Intravenous,  Once, 1 of 4 cycles Administration:  62 mg (08/13/2018) palonosetron (ALOXI) injection 0.25 mg, 0.25 mg, Intravenous,  Once, 1 of 4 cycles Administration: 0.25 mg (08/13/2018), 0.25 mg (08/14/2018) pegfilgrastim (NEULASTA ONPRO KIT) injection 6 mg, 6 mg, Subcutaneous, Once, 1 of 4 cycles Administration: 6 mg (08/14/2018) methotrexate (PF) 61.25 mg in sodium chloride 0.9 % 50 mL chemo infusion, 30 mg/m2 = 61.25 mg, Intravenous,  Once, 1 of 4 cycles Administration: 61.25 mg  (08/12/2018) CISplatin (PLATINOL) 71 mg in sodium chloride 0.9 % 250 mL chemo infusion, 35 mg/m2 = 71 mg (100 % of original dose 35 mg/m2), Intravenous,  Once, 1 of 4 cycles Dose modification: 35 mg/m2 (original dose 35 mg/m2, Cycle 1, Reason: Provider Judgment) Administration: 71 mg (08/13/2018), 71 mg (08/14/2018) vinBLAStine (VELBAN) 6.1 mg in sodium chloride 0.9 % 50 mL chemo infusion, 3 mg/m2 = 6.1 mg, Intravenous, Once, 1 of 4 cycles Administration: 6.1 mg (08/13/2018) fosaprepitant (EMEND) 150 mg, dexamethasone (DECADRON) 12 mg in sodium chloride 0.9 % 145 mL IVPB, , Intravenous,  Once, 1 of 4 cycles Administration:  (08/13/2018),  (08/14/2018)  for chemotherapy treatment.    Metastatic urothelial carcinoma ([place])  03/23/2019 Initial Diagnosis   Metastatic urothelial carcinoma ([place])   03/31/2019 -  Chemotherapy   The patient had pembrolizumab (KEYTRUDA) 200 mg in sodium chloride 0.9 % 50 mL chemo infusion, 200 mg, Intravenous, Once, 3 of 6 cycles Administration: 200 mg (03/31/2019), 200 mg (04/21/2019)  for chemotherapy treatment.      ALLERGIES:  has No Known Allergies.  MEDICATIONS:  Current Outpatient Medications  Medication Sig Dispense Refill  . amLODipine (NORVASC) 5 MG tablet Take 1 tablet (5 mg total) by mouth daily. 90 tablet 1  . apixaban (ELIQUIS) 5 MG TABS tablet Take 1 tablet (5 mg total) by mouth 2 (two) times daily. 60 tablet 3  . diclofenac sodium (VOLTAREN) 1 % GEL Apply 2 g topically 4 (four) times daily. 100 g 2  . lidocaine-prilocaine (EMLA) cream Apply to affected area once 30 g 3  . polyethylene glycol (MIRALAX / GLYCOLAX) 17 g packet Take 17 g by mouth daily as needed for mild constipation. 14 each 0  . pravastatin (PRAVACHOL) 10 MG tablet Take 1 tablet (10 mg total) by mouth at bedtime. 90 tablet 1  . senna (SENOKOT) 8.6 MG TABS tablet Take 2 tablets (17.2 mg total) by mouth daily. 60 tablet 0   No current facility-administered medications for this visit.     Facility-Administered Medications Ordered in Other Visits  Medication Dose Route Frequency Provider Last Rate Last Dose  . heparin lock flush 100 unit/mL  500 Units Intracatheter Once PRN [name], MD      . pembrolizumab [facility]) 200 mg in sodium chloride 0.9 % 50 mL chemo infusion  200 mg Intravenous Once [name], MD 116 mL/hr at 05/12/19 1031 200 mg at 05/12/19 1031    VITAL SIGNS: There were no vitals taken for this visit. There were no vitals filed for this visit.  Estimated body mass index is 33.3 kg/m as calculated from the following:   Height as of 03/30/19: '5\' 3"'$  (1.6 m).   Weight as of an earlier encounter on 05/12/19: 188 lb (85.3 kg).  LABS: CBC:    Component Value Date/Time   WBC 4.2 05/12/2019 0828   HGB 9.3 (L) 05/12/2019 0828   HGB 10.7 (L) 01/11/2017 1529   HCT 30.9 (L) 05/12/2019 0828   HCT 34.4 01/11/2017 1529   PLT 234 05/12/2019 0828   PLT 292 01/11/2017 1529  MCV 78.8 (L) 05/12/2019 0828   MCV 77 (L) 01/11/2017 1529   NEUTROABS 2.6 05/12/2019 0828   NEUTROABS 3.2 01/11/2017 1529   LYMPHSABS 1.1 05/12/2019 0828   LYMPHSABS 1.9 01/11/2017 1529   MONOABS 0.4 05/12/2019 0828   EOSABS 0.1 05/12/2019 0828   EOSABS 0.1 01/11/2017 1529   BASOSABS 0.0 05/12/2019 0828   BASOSABS 0.0 01/11/2017 1529   Comprehensive Metabolic Panel:    Component Value Date/Time   NA 138 05/12/2019 0828   NA 139 12/18/2018 1740   K 3.8 05/12/2019 0828   CL 108 05/12/2019 0828   CO2 20 (L) 05/12/2019 0828   BUN 27 (H) 05/12/2019 0828   BUN 38 (H) 12/18/2018 1740   CREATININE 1.41 (H) 05/12/2019 0828   GLUCOSE 121 (H) 05/12/2019 0828   CALCIUM 9.0 05/12/2019 0828   AST 20 05/12/2019 0828   ALT 12 05/12/2019 0828   ALKPHOS 99 05/12/2019 0828   BILITOT 0.5 05/12/2019 0828   BILITOT 0.2 12/18/2018 1740   PROT 7.5 05/12/2019 0828   PROT 7.5 12/18/2018 1740   ALBUMIN 3.8 05/12/2019 0828   ALBUMIN 4.5 12/18/2018 1740    RADIOGRAPHIC STUDIES: No results found.   PERFORMANCE STATUS (ECOG) : 1 - Symptomatic but completely ambulatory  Review of Systems Unless otherwise noted, a complete review of systems is negative.  Physical Exam General: NAD, frail appearing, thin Pulmonary: unlabored Extremities: no edema, no joint deformities Skin: no rashes Neurological: Weakness but otherwise nonfocal  IMPRESSION: Routine follow-up visit.  Patient was seen in the infusion area.  Patient was not very forthcoming with information.  Patient reports that she is doing well.  She denies any acute changes or concerns.  She denies any distressing symptoms today.  Patient says that her appetite is "too good."  Weight is stable.   PLAN: -Continue current scope of treatment -We will need to discuss ACP and MOST Form -RTC in 3 weeks   Patient expressed understanding and was in agreement with this plan. She also understands that She can call the clinic at any time with any questions, concerns, or complaints.     Time Total: 10 minutes  Visit consisted of counseling and education dealing with the complex and emotionally intense issues of symptom management and palliative care in the setting of serious and potentially life-threatening illness.Greater than 50%  of this time was spent counseling and coordinating care related to the above assessment and plan.  Signed by: Altha Harm, PhD, NP-C

## 2019-06-02 ENCOUNTER — Other Ambulatory Visit: Payer: Self-pay

## 2019-06-02 ENCOUNTER — Other Ambulatory Visit: Payer: Self-pay | Admitting: Oncology

## 2019-06-02 ENCOUNTER — Ambulatory Visit (INDEPENDENT_AMBULATORY_CARE_PROVIDER_SITE_OTHER): Payer: Medicare Other | Admitting: Nurse Practitioner

## 2019-06-02 ENCOUNTER — Ambulatory Visit (INDEPENDENT_AMBULATORY_CARE_PROVIDER_SITE_OTHER): Payer: Medicare Other

## 2019-06-02 ENCOUNTER — Inpatient Hospital Stay: Payer: Medicare Other

## 2019-06-02 ENCOUNTER — Inpatient Hospital Stay (HOSPITAL_BASED_OUTPATIENT_CLINIC_OR_DEPARTMENT_OTHER): Payer: Medicare Other | Admitting: Oncology

## 2019-06-02 ENCOUNTER — Encounter: Payer: Self-pay | Admitting: Oncology

## 2019-06-02 ENCOUNTER — Encounter (INDEPENDENT_AMBULATORY_CARE_PROVIDER_SITE_OTHER): Payer: Self-pay | Admitting: Nurse Practitioner

## 2019-06-02 VITALS — BP 170/68 | HR 102 | Resp 16 | Wt 189.0 lb

## 2019-06-02 VITALS — BP 162/78 | HR 88 | Temp 94.9°F | Resp 18 | Wt 191.1 lb

## 2019-06-02 DIAGNOSIS — Z86718 Personal history of other venous thrombosis and embolism: Secondary | ICD-10-CM

## 2019-06-02 DIAGNOSIS — E118 Type 2 diabetes mellitus with unspecified complications: Secondary | ICD-10-CM | POA: Diagnosis not present

## 2019-06-02 DIAGNOSIS — Z5112 Encounter for antineoplastic immunotherapy: Secondary | ICD-10-CM | POA: Diagnosis not present

## 2019-06-02 DIAGNOSIS — I82401 Acute embolism and thrombosis of unspecified deep veins of right lower extremity: Secondary | ICD-10-CM

## 2019-06-02 DIAGNOSIS — C7910 Secondary malignant neoplasm of unspecified urinary organs: Secondary | ICD-10-CM

## 2019-06-02 DIAGNOSIS — I82421 Acute embolism and thrombosis of right iliac vein: Secondary | ICD-10-CM | POA: Diagnosis not present

## 2019-06-02 DIAGNOSIS — N1831 Chronic kidney disease, stage 3a: Secondary | ICD-10-CM

## 2019-06-02 DIAGNOSIS — E782 Mixed hyperlipidemia: Secondary | ICD-10-CM

## 2019-06-02 DIAGNOSIS — Z95828 Presence of other vascular implants and grafts: Secondary | ICD-10-CM

## 2019-06-02 DIAGNOSIS — D631 Anemia in chronic kidney disease: Secondary | ICD-10-CM

## 2019-06-02 DIAGNOSIS — Z7901 Long term (current) use of anticoagulants: Secondary | ICD-10-CM

## 2019-06-02 LAB — COMPREHENSIVE METABOLIC PANEL
ALT: 11 U/L (ref 0–44)
AST: 21 U/L (ref 15–41)
Albumin: 3.7 g/dL (ref 3.5–5.0)
Alkaline Phosphatase: 112 U/L (ref 38–126)
Anion gap: 11 (ref 5–15)
BUN: 30 mg/dL — ABNORMAL HIGH (ref 8–23)
CO2: 21 mmol/L — ABNORMAL LOW (ref 22–32)
Calcium: 8.9 mg/dL (ref 8.9–10.3)
Chloride: 109 mmol/L (ref 98–111)
Creatinine, Ser: 1.53 mg/dL — ABNORMAL HIGH (ref 0.44–1.00)
GFR calc Af Amer: 40 mL/min — ABNORMAL LOW (ref >60)
GFR calc non Af Amer: 34 mL/min — ABNORMAL LOW (ref >60)
Glucose, Bld: 194 mg/dL — ABNORMAL HIGH (ref 70–99)
Potassium: 4 mmol/L (ref 3.5–5.1)
Sodium: 141 mmol/L (ref 135–145)
Total Bilirubin: 0.4 mg/dL (ref 0.3–1.2)
Total Protein: 7.4 g/dL (ref 6.5–8.1)

## 2019-06-02 LAB — CBC WITH DIFFERENTIAL/PLATELET
Abs Immature Granulocytes: 0.02 10*3/uL (ref 0.00–0.07)
Basophils Absolute: 0 10*3/uL (ref 0.0–0.1)
Basophils Relative: 0 %
Eosinophils Absolute: 0.1 10*3/uL (ref 0.0–0.5)
Eosinophils Relative: 3 %
HCT: 31.7 % — ABNORMAL LOW (ref 36.0–46.0)
Hemoglobin: 9.4 g/dL — ABNORMAL LOW (ref 12.0–15.0)
Immature Granulocytes: 0 %
Lymphocytes Relative: 21 %
Lymphs Abs: 1 10*3/uL (ref 0.7–4.0)
MCH: 23.9 pg — ABNORMAL LOW (ref 26.0–34.0)
MCHC: 29.7 g/dL — ABNORMAL LOW (ref 30.0–36.0)
MCV: 80.5 fL (ref 80.0–100.0)
Monocytes Absolute: 0.4 10*3/uL (ref 0.1–1.0)
Monocytes Relative: 9 %
Neutro Abs: 3.3 10*3/uL (ref 1.7–7.7)
Neutrophils Relative %: 67 %
Platelets: 218 10*3/uL (ref 150–400)
RBC: 3.94 MIL/uL (ref 3.87–5.11)
RDW: 15.7 % — ABNORMAL HIGH (ref 11.5–15.5)
WBC: 4.9 10*3/uL (ref 4.0–10.5)
nRBC: 0 % (ref 0.0–0.2)

## 2019-06-02 MED ORDER — HEPARIN SOD (PORK) LOCK FLUSH 100 UNIT/ML IV SOLN
INTRAVENOUS | Status: AC
  Filled 2019-06-02: qty 5

## 2019-06-02 MED ORDER — SODIUM CHLORIDE 0.9 % IV SOLN
200.0000 mg | Freq: Once | INTRAVENOUS | Status: AC
  Administered 2019-06-02: 200 mg via INTRAVENOUS
  Filled 2019-06-02: qty 8

## 2019-06-02 MED ORDER — HEPARIN SOD (PORK) LOCK FLUSH 100 UNIT/ML IV SOLN
500.0000 [IU] | Freq: Once | INTRAVENOUS | Status: AC
  Administered 2019-06-02: 500 [IU] via INTRAVENOUS
  Filled 2019-06-02: qty 5

## 2019-06-02 MED ORDER — SODIUM CHLORIDE 0.9 % IV SOLN
Freq: Once | INTRAVENOUS | Status: AC
  Filled 2019-06-02: qty 250

## 2019-06-02 NOTE — Progress Notes (Signed)
Per Dr. Tasia Catchings, we can proceed with Keytruda infusion with a creatinine of 1.53.

## 2019-06-02 NOTE — Progress Notes (Signed)
Patient reports right ankle swelling and left thumb swelling/sore.

## 2019-06-02 NOTE — Progress Notes (Signed)
Hematology/Oncology follow up note [facility] [phone] [phone]   Patient Care Team: Volney American, PA-C as PCP - General (Family Medicine) Idelle Leech, OD (Optometry)  REFERRING PROVIDER: Dr.Sninsky CHIEF COMPLAINTS/REASON FOR VISIT:  Follow up for bladder cancer, anemia.   HISTORY OF PRESENTING ILLNESS:  Angela Russell is a  68 y.o.  female with PMH listed below who was referred to me for evaluation of newly diagnosed bladder cancer. Patient initially presented to emergency room at the end of January 2020 for evaluation of dysuria, hematuria and the left lower quadrant inguinal pain and flank pain.  1/28 2020 CT renal stone study showed suspected irregular wall thickening about the superior bladder, not well assessed due to degree of bladder distention.  Recommend cystoscopy for further evaluation.  No renal stone or obstructive uropathy.  Patient was given IV Rocephin and referred patient for outpatient urology follow-up.  Urine culture was negative.  She again presented to ER after 2 days with similar symptoms.  Patient has 25-pack-year smoking history, quit approximately 20 years ago.  No family history of any urology malignancies 07/03/2018 another CT abdomen pelvis with contrast was done which showed no nephrolithiasis or hydronephrosis is identified.  Bladder is decompressed limiting evaluation.  07/16/2018.urology Dr. [name] - cystoscopy and bilateral retrograde pyelogram on 07/16/2018.  Pyelogram did not show any filling defect or abnormalities.  No hydronephrosis.  Ureteral orifice was not involved with tumor.  There is a large 5 cm posterior wall bladder tumor, bullous and sessile appearing.  Patient underwent TURBT.   Pathology: High-grade urothelial carcinoma, invasive into muscularis propria.  Lymphovascular invasion is present.  Carcinoma in situ is also identified.  Focal squamous differentiation is noted, areas of  invasive carcinoma display pleomorphic/sarcomatoid changes.  T2b  08/07/2018 CT without contrast negative.  2 subpleural right upper lobe nodule 2 to 3 mm likely benign. She also had baseline audiometry done.  08/04/2018 ddMVAC x 1 cycle, stopped due to intolerance and AKI.  Patient received 1 cycle of dd MVAC, not able to tolerate due to AKI. Patient then was referred to [facility] urology  09/22/2018 patient underwent a cystectomy, pathology pT3a N0 Mx.  11 lymph nodes were harvested and was all negative. Invasive urothelia carcinoma, high grade, with sarcomatoid features.   10/14/2018 patient was admitted due to pyelonephritis and a pelvic fluid collection.  Drain was placed. 10/23/2018 drain was removed. Patient has had difficulties getting to her appointments to [facility]. 02/09/2019, patient presented with abdominal pain. CT concerning for small bowel obstruction and increased size of right pelvic fluid collection concerning for cancer recurrence.  Right hydronephrosis to the level of pelvis and enlarged lymph nodes. Patient had JP drain placed with CT guidance to pelvic fluid collection and drained 400 cc amber fluid.  Culture was negative for growth of microorganisms and cytology was negative for malignancy.-JP drain was removed on the day of discharge. CT-guided core biopsy of pelvic lymph node adenopathy was attempted but not successful.  # Right lower extremity DVT, provoked by Transvenous biopsy  02/26/2019 transvenous biopsy by IR unsuccessful transvenous biopsy of the right pelvis mass. Post biopsy acute thrombus in the right external iliac and common femoral vein.  Patient was recommended to start anticoagulation with Xarelto however due to the co-pay, patient is not able to afford the medication. Anticoagulation regimen was switched to Eliquis 5 mg.  Patient reports that she has been taking it once a day. 03/20/2019 PET showed FDG avid  tissue in the cystectomy bed, retroperitoneal and  pelvic lymphadenopathy.  Right common iliac DVT.  # increased right lower extremity swelling.  She was started on Eliquis for anticoagulation by [facility].  We clarified with her pharmacy and she was actually taking Eliquis 2.5 mg twice daily. Right lower extremity swelling has not improved but instead worsened. 03/16/2019 She had ultrasound right lower extremity done which showed persistent extensive proximal right lower extremity DVT.  Anticoagulation regimen has increased to Eliquis 5 mg twice daily.  # establish care with [facility] oncology Dr. [name] for evaluation.  Dr. [name] recommended starting immunotherapy with PD-L1 inhibitor Pembrolizumab 200 mg every 3 weeks.  The sarcomatoid histology may not respond to chemotherapy well, could portend a better chance of response into immunotherapy # Dr. [name] recommended PET scan in 14 weeks.  # 03/30/2019 Status post IVC filter placement, mechanical thrombectomy. # PD-L1 CPS 100%.  # 03/31/2019 started on immunotherapy Keytruda.   INTERVAL HISTORY Angela Russell is a 68 y.o. female who has above history reviewed by me today presents for follow up visit for evaluation form metastatic high-grade urothelial carcinoma of the bladder. Sarcomatoid feature.  Patient has been doing well on immunotherapy with Keytruda.  Patient reports doing well at baseline. She has resumed working as a Administrator. Denies any rash, diarrhea, fatigue, fever or chills.  She continues to take Eliquis 5 mg twice daily.  Status post IVC filter placement and mechanical thrombectomy. She reports right ankle intermittent swelling, worst at the end of the day.  She uses compression stocking.  Anemia in CKD, fatigue is at baseline.  Appetite is good.  Patient has gained 3 pounds since last visit. She mentioned to nurse about bilateral thumb soreness.  With further questioning, she reports that the soreness is chronic and she attributes to holding steering wheel while  driving.   Review of Systems  Constitutional: Negative for appetite change, chills, fatigue and fever.  HENT:   Negative for hearing loss and voice change.   Eyes: Negative for eye problems.  Respiratory: Negative for chest tightness and cough.   Cardiovascular: Positive for leg swelling. Negative for chest pain.  Gastrointestinal: Negative for abdominal distention, abdominal pain and blood in stool.  Endocrine: Negative for hot flashes.  Genitourinary: Negative for difficulty urinating and frequency.   Musculoskeletal: Negative for arthralgias.  Skin: Negative for itching and rash.  Neurological: Negative for extremity weakness.  Hematological: Negative for adenopathy.  Psychiatric/Behavioral: Negative for confusion. The patient is not nervous/anxious.     MEDICAL HISTORY:  Past Medical History:  Diagnosis Date   Carotid artery plaque, right 01/2014   CKD (chronic kidney disease)    stage 2-3   Diabetes mellitus without complication (HCC)    DM (diabetes mellitus), type 2, uncontrolled (HCC)    Hyperlipidemia    Hypertension    Hypochromic microcytic anemia    mild   Metastatic urothelial carcinoma ([place]) 03/23/2019   Osteoporosis    Renal insufficiency    Rotator cuff tendonitis, right     SURGICAL HISTORY: Past Surgical History:  Procedure Laterality Date   ABDOMINAL HYSTERECTOMY  2000   due to bleeding and fibroids, partial- still has ovaries   CYSTOSCOPY W/ RETROGRADES Bilateral 07/16/2018   Procedure: CYSTOSCOPY WITH RETROGRADE PYELOGRAM;  Surgeon: Billey Co, MD;  Location: [facility] ORS;  Service: Urology;  Laterality: Bilateral;   IVC FILTER INSERTION N/A 03/30/2019   Procedure: IVC FILTER INSERTION;  Surgeon: Algernon Huxley, MD;  Location: [facility]  INVASIVE CV LAB;  Service: Cardiovascular;  Laterality: N/A;   KNEE SURGERY Left 03/17/2013   torn meniscus   PERIPHERAL VASCULAR THROMBECTOMY Right 03/30/2019   Procedure: PERIPHERAL VASCULAR  THROMBECTOMY;  Surgeon: Algernon Huxley, MD;  Location: [place] CV LAB;  Service: Cardiovascular;  Laterality: Right;   PORTA CATH INSERTION N/A 08/06/2018   Procedure: PORTA CATH INSERTION;  Surgeon: Algernon Huxley, MD;  Location: [place] CV LAB;  Service: Cardiovascular;  Laterality: N/A;   TRANSURETHRAL RESECTION OF BLADDER TUMOR N/A 07/16/2018   Procedure: TRANSURETHRAL RESECTION OF BLADDER TUMOR (TURBT);  Surgeon: Billey Co, MD;  Location: [facility] ORS;  Service: Urology;  Laterality: N/A;    SOCIAL HISTORY: Social History   Socioeconomic History   Marital status: Single    Spouse name: Not on file   Number of children: 1   Years of education: Not on file   Highest education level: Not on file  Occupational History   Not on file  Tobacco Use   Smoking status: Former Smoker    Quit date: 06/05/1991    Years since quitting: 28.0   Smokeless tobacco: Never Used  Substance and Sexual Activity   Alcohol use: No   Drug use: No   Sexual activity: Never  Other Topics Concern   Not on file  Social History Narrative   Working full time   Social Determinants of Radio broadcast assistant Strain:    Difficulty of Paying Living Expenses: Not on file  Food Insecurity:    Worried About Charity fundraiser in the Last Year: Not on file   [employer] of Food in the Last Year: Not on file  Transportation Needs:    Lack of Transportation (Medical): Not on file   Lack of Transportation (Non-Medical): Not on file  Physical Activity:    Days of Exercise per Week: Not on file   Minutes of Exercise per Session: Not on file  Stress:    Feeling of Stress : Not on file  Social Connections:    Frequency of Communication with Friends and Family: Not on file   Frequency of Social Gatherings with Friends and Family: Not on file   Attends Religious Services: Not on file   Active Member of Clubs or Organizations: Not on file   Attends Archivist Meetings:  Not on file   Marital Status: Not on file  Intimate Partner Violence:    Fear of Current or Ex-Partner: Not on file   Emotionally Abused: Not on file   Physically Abused: Not on file   Sexually Abused: Not on file    FAMILY HISTORY: Family History  Problem Relation Age of Onset   Heart disease Mother    Heart attack Mother    Arthritis Father    Diabetes Brother     ALLERGIES:  has No Known Allergies.  MEDICATIONS:  Current Outpatient Medications  Medication Sig Dispense Refill   amLODipine (NORVASC) 5 MG tablet Take 1 tablet (5 mg total) by mouth daily. 90 tablet 1   apixaban (ELIQUIS) 5 MG TABS tablet Take 1 tablet (5 mg total) by mouth 2 (two) times daily. 60 tablet 3   diclofenac sodium (VOLTAREN) 1 % GEL Apply 2 g topically 4 (four) times daily. 100 g 2   lidocaine-prilocaine (EMLA) cream Apply to affected area once 30 g 3   polyethylene glycol (MIRALAX / GLYCOLAX) 17 g packet Take 17 g by mouth daily as needed for mild constipation.  14 each 0   pravastatin (PRAVACHOL) 10 MG tablet Take 1 tablet (10 mg total) by mouth at bedtime. 90 tablet 1   senna (SENOKOT) 8.6 MG TABS tablet Take 2 tablets (17.2 mg total) by mouth daily. 60 tablet 0   No current facility-administered medications for this visit.     PHYSICAL EXAMINATION: ECOG PERFORMANCE STATUS: 1 - Symptomatic but completely ambulatory Vitals:   06/02/19 0912  BP: (!) 162/78  Pulse: 88  Resp: 18  Temp: (!) 94.9 F (34.9 C)   Filed Weights   06/02/19 0912  Weight: 191 lb 1.6 oz (86.7 kg)    Physical Exam Constitutional:      General: She is not in acute distress.    Comments: Walk independently  HENT:     Head: Normocephalic and atraumatic.  Eyes:     General: No scleral icterus.    Pupils: Pupils are equal, round, and reactive to light.  Cardiovascular:     Rate and Rhythm: Normal rate and regular rhythm.     Heart sounds: Normal heart sounds.  Pulmonary:     Effort: Pulmonary  effort is normal. No respiratory distress.     Breath sounds: No wheezing.  Abdominal:     General: Bowel sounds are normal. There is no distension.     Palpations: Abdomen is soft. There is no mass.     Tenderness: There is no abdominal tenderness.  Musculoskeletal:        General: No deformity. Normal range of motion.     Cervical back: Normal range of motion and neck supple.     Comments: Trace right lower extremity edema  Skin:    General: Skin is warm and dry.     Coloration: Skin is not pale.     Findings: No erythema or rash.  Neurological:     Mental Status: She is alert and oriented to person, place, and time.     Cranial Nerves: No cranial nerve deficit.     Coordination: Coordination normal.  Psychiatric:        Behavior: Behavior normal.        Thought Content: Thought content normal.     RADIOGRAPHIC STUDIES: I have personally reviewed the radiological images as listed and agreed with the findings in the report.  CMP Latest Ref Rng & Units 06/02/2019  Glucose 70 - 99 mg/dL 194(H)  BUN 8 - 23 mg/dL 30(H)  Creatinine 0.44 - 1.00 mg/dL 1.53(H)  Sodium 135 - 145 mmol/L 141  Potassium 3.5 - 5.1 mmol/L 4.0  Chloride 98 - 111 mmol/L 109  CO2 22 - 32 mmol/L 21(L)  Calcium 8.9 - 10.3 mg/dL 8.9  Total Protein 6.5 - 8.1 g/dL 7.4  Total Bilirubin 0.3 - 1.2 mg/dL 0.4  Alkaline Phos 38 - 126 U/L 112  AST 15 - 41 U/L 21  ALT 0 - 44 U/L 11   CBC Latest Ref Rng & Units 06/02/2019  WBC 4.0 - 10.5 K/uL 4.9  Hemoglobin 12.0 - 15.0 g/dL 9.4(L)  Hematocrit 36.0 - 46.0 % 31.7(L)  Platelets 150 - 400 K/uL 218    LABORATORY DATA:  I have reviewed the data as listed Lab Results  Component Value Date   WBC 4.9 06/02/2019   HGB 9.4 (L) 06/02/2019   HCT 31.7 (L) 06/02/2019   MCV 80.5 06/02/2019   PLT 218 06/02/2019   Recent Labs    12/19/18 1841 04/21/19 0842 05/12/19 0828 06/02/19 0850  NA 137 137 138 141  K 6.3* 3.8 3.8 4.0  CL 115* 109 108 109  CO2 15* 20* 20*  21*  GLUCOSE 129* 116* 121* 194*  BUN 37* 29* 27* 30*  CREATININE 2.03* 1.41* 1.41* 1.53*  CALCIUM 9.7 8.9 9.0 8.9  GFRNONAA 24* 38* 38* 34*  GFRAA 28* 44* 44* 40*  PROT  --  7.9 7.5 7.4  ALBUMIN  --  4.1 3.8 3.7  AST  --  _0 ALT  --  _1 ALKPHOS  --  115 99 112  BILITOT 0.5 0.5 0.5 0.4  BILIDIR <0.1  --   --   --   IBILI NOT CALCULATED  --   --   --    Iron/TIBC/Ferritin/ %Sat    Component Value Date/Time   IRON 46 03/04/2019 1423   IRON 26 (L) 02/04/2019 1309   TIBC 267 03/04/2019 1423   TIBC 251 02/04/2019 1309   FERRITIN 316 (H) 03/04/2019 1423   FERRITIN 409 (H) 02/04/2019 1309   IRONPCTSAT 17 03/04/2019 1423   IRONPCTSAT 10 (L) 02/04/2019 1309    RADIOGRAPHIC STUDIES: I have personally reviewed the radiological images as listed and agreed with the findings in the report. PERIPHERAL VASCULAR CATHETERIZATION  Result Date: 03/30/2019 See op note  US Venous Img Lower Unilateral Right  Result Date: 03/16/2019 CLINICAL DATA:  68 year old female with persistent right lower extremity swelling and a known right-sided DVT for the past 2 weeks. EXAM: RIGHT LOWER EXTREMITY VENOUS DOPPLER ULTRASOUND TECHNIQUE: Gray-scale sonography with graded compression, as well as color Doppler and duplex ultrasound were performed to evaluate the lower extremity deep venous systems from the level of the common femoral vein and including the common femoral, femoral, profunda femoral, popliteal and calf veins including the posterior tibial, peroneal and gastrocnemius veins when visible. The superficial great saphenous vein was also interrogated. Spectral Doppler was utilized to evaluate flow at rest and with distal augmentation maneuvers in the common femoral, femoral and popliteal veins. COMPARISON:  Recent duplex venous ultrasound 03/06/2019 FINDINGS: Contralateral Common Femoral Vein: Respiratory phasicity is normal and symmetric with the symptomatic side. No evidence of thrombus.  Normal compressibility. Common Femoral Vein: Similar appearance of the right common femoral vein which remains only partially compressible with internal eccentric filling defects and only partial flow on color Doppler imaging. The findings remain consistent with nonocclusive DVT. Saphenofemoral Junction: No evidence of thrombus. Normal compressibility and flow on color Doppler imaging. Profunda Femoral Vein: No evidence of thrombus. Normal compressibility and flow on color Doppler imaging. Femoral Vein: No evidence of thrombus. Normal compressibility, respiratory phasicity and response to augmentation. Popliteal Vein: No evidence of thrombus. Normal compressibility, respiratory phasicity and response to augmentation. Calf Veins: No evidence of thrombus. Normal compressibility and flow on color Doppler imaging. Superficial Great Saphenous Vein: No evidence of thrombus. Normal compressibility. Venous Reflux:  None. Other Findings:  None. IMPRESSION: Persistent and unchanged nonocclusive thrombus in the right common femoral vein. No significant interval improvement compared to 03/06/2019. Electronically Signed   By: Jacqulynn Cadet M.D.   On: 03/16/2019 16:11   US Venous Img Lower Unilateral Right  Result Date: 03/06/2019 CLINICAL DATA:  Pain and swelling for 1 week EXAM: Right LOWER EXTREMITY VENOUS DOPPLER ULTRASOUND TECHNIQUE: Gray-scale sonography with graded compression, as well as color Doppler and duplex ultrasound were performed to evaluate the lower extremity deep venous systems from the level of the common femoral vein and including the common femoral, femoral, profunda femoral, popliteal and calf  veins including the posterior tibial, peroneal and gastrocnemius veins when visible. The superficial great saphenous vein was also interrogated. Spectral Doppler was utilized to evaluate flow at rest and with distal augmentation maneuvers in the common femoral, femoral and popliteal veins. COMPARISON:  None.  FINDINGS: Contralateral Common Femoral Vein: Respiratory phasicity is normal and symmetric with the symptomatic side. No evidence of thrombus. Normal compressibility. Common Femoral Vein: Nonocclusive thrombus within the right common femoral vein, extends into the right external iliac vein. Vessel is noncompressible. Saphenofemoral Junction: Thrombus within the common femoral vein extending to the saphenofemoral junction. Profunda Femoral Vein: No evidence of thrombus. Normal compressibility and flow on color Doppler imaging. Femoral Vein: No evidence of thrombus. Normal compressibility, respiratory phasicity and response to augmentation. Popliteal Vein: No evidence of thrombus. Normal compressibility, respiratory phasicity and response to augmentation. Calf Veins: No evidence of thrombus. Normal compressibility and flow on color Doppler imaging. Other Findings:  Limited evaluation of the IVC shows patency. IMPRESSION: Positive for acute nonocclusive DVT extending from the right external iliac vein to the common femoral vein. Electronically Signed   By: Donavan Foil M.D.   On: 03/06/2019 21:03   VAS [place] LOWER EXTREMITY VENOUS (DVT)  Result Date: 03/27/2019  Lower Venous Study Risk Factors: DVT right. Comparison Study: 03/16/2019 Performing Technologist: Concha Norway RVT  Examination Guidelines: A complete evaluation includes B-mode imaging, spectral Doppler, color Doppler, and power Doppler as needed of all accessible portions of each vessel. Bilateral testing is considered an integral part of a complete examination. Limited examinations for reoccurring indications may be performed as noted.  +---------+---------------+---------+-----------+-----------------+------------+  RIGHT     Compressibility Phasicity Spontaneity Properties        Thrombus                                                                         Aging         +---------+---------------+---------+-----------+-----------------+------------+   CFV       Partial         Yes       Yes         partially         Chronic                                                        re-cannalized                   +---------+---------------+---------+-----------+-----------------+------------+  SFJ       Full            Yes       Yes                                         +---------+---------------+---------+-----------+-----------------+------------+  FV Prox   Full                                                                  +---------+---------------+---------+-----------+-----------------+------------+  FV Mid    Full            Yes       Yes                                         +---------+---------------+---------+-----------+-----------------+------------+  FV Distal Full                                                                  +---------+---------------+---------+-----------+-----------------+------------+  PFV       Full            Yes       Yes                                         +---------+---------------+---------+-----------+-----------------+------------+  POP       Full            Yes       Yes                                         +---------+---------------+---------+-----------+-----------------+------------+  PTV       Full            Yes       Yes                                         +---------+---------------+---------+-----------+-----------------+------------+  PERO      Full            Yes       Yes                                         +---------+---------------+---------+-----------+-----------------+------------+  Gastroc   Full                                                                  +---------+---------------+---------+-----------+-----------------+------------+  GSV       Full            Yes       Yes                                         +---------+---------------+---------+-----------+-----------------+------------+  SSV       Full                                                                   +---------+---------------+---------+-----------+-----------------+------------+   +-------+---------------+---------+-----------+----------+--------------+  LEFT    Compressibility Phasicity Spontaneity Properties Thrombus Aging  +-------+---------------+---------+-----------+----------+--------------+  CFV     Full            Yes                                              +-------+---------------+---------+-----------+----------+--------------+  SFJ     Full            Yes                                              +-------+---------------+---------+-----------+----------+--------------+  FV Prox Full            Yes                                              +-------+---------------+---------+-----------+----------+--------------+     Summary: Right: Continued minimal chronic appearing thrombus on the posterior wall of the CFV only. Recannalized flow seen. No DVT found in remainder of lower extremity venous system Left: No evidence of common femoral vein obstruction.  *See table(s) above for measurements and observations. Electronically signed by Leotis Pain MD on 03/27/2019 at 1:17:26 PM.    Final    Patient had PET scan done 03/20/2019 which showed enhancing soft nodule in the cystectomy bed is worrisome for local recurrence. Right greater than left pelvic retroperitoneal and mesenteric lymphadenopathy consistent with nodal metastatic disease Deep vein thrombosis is seen in the right common iliac vein.  The upstream right external iliac vein is severely narrowed by right pelvic sidewall lymphadenopathy and postsurgical changes.  The right lower extremity appears enlarged and erythematous, likely due to venous insufficiency.  Loculated pelvic fluid collection appears smaller compared to February 09, 2019 scan.  ASSESSMENT & PLAN:  1. Metastatic urothelial carcinoma ([place])   2. Anemia in stage 3a chronic kidney disease   3. History of DVT of lower extremity   4. Encounter for antineoplastic  immunotherapy   5. Chronic anticoagulation   Cancer Staging Bladder carcinoma [facility]) Staging form: Urinary Bladder, AJCC 8th Edition - Clinical stage from 08/01/2018: Stage IVA (cTX, cN3, cM1a) - Signed by [name], MD on 04/21/2019  #Metastatic urothelial carcinoma of bladder, sarcomatoid features pelvic sidewall mass showed metastatic carcinoma consistent with involvement by urothelial carcinoma. Labs reviewed and discussed with patient. Clinically she is doing very well. She tolerates immunotherapy without any significant immunotherapy toxicities. Proceed with pembrolizumab treatment today. Schedule PET scan prior to next cycle of treatments.  #Right lower extremity provoked femoral vein DVT secondary to transvenous biopsy in the context of cancer recurrence Continue Eliquis 5 mg twice daily daily. Patient has IVC filter which need to be removed in the future.  Suggest patient to follow-up with vascular surgery Her plan to repeat right lower extremity ultrasound around March 2021. Maintenance therapy depending on ultrasound results and her cancer status. #Intermittent right lower extremity edema, most likely secondary to vein insufficiency/post thrombotic syndrome.  Continue compression stocking. #Anemia in CKD.Marland Kitchen  Hemoglobin 9.4.  Stable erythropoietin therapy is contraindicated due to active cancer.  #Goals of care, palliative. Follow-up with palliative service.  All questions were  answered. The patient knows to call the clinic with any problems questions or concerns.  Return of visit: 3 weeks.    [name], MD, PhD Hematology Oncology [facility] Pager- [phone] 06/02/19

## 2019-06-03 ENCOUNTER — Telehealth (INDEPENDENT_AMBULATORY_CARE_PROVIDER_SITE_OTHER): Payer: Self-pay

## 2019-06-03 NOTE — Telephone Encounter (Signed)
Spoke with the patient and she is now scheduled with Dr. Lucky Cowboy for IVC filter removal on 06/15/19 with a 6:45 am arrival time to the [facility]. Patient will do covid testing on 06/11/19 between 12:30-2:30 pm at [facility]. Pre-procedure instructions were discussed and will be mailed to the patient.

## 2019-06-06 ENCOUNTER — Encounter (INDEPENDENT_AMBULATORY_CARE_PROVIDER_SITE_OTHER): Payer: Self-pay | Admitting: Nurse Practitioner

## 2019-06-06 NOTE — Progress Notes (Signed)
SUBJECTIVE:  Patient ID: Angela Russell, female    DOB: 06-Apr-1951, 69 y.o.   MRN: [id number] Chief Complaint  Patient presents with  . Follow-up    [facility] 3week dvt     HPI  Angela Russell is a 69 y.o. female that presents today after right lower extremity thrombectomy and IVC filter placement on 03/30/2019.  The patient denies any issues following thrombectomy and minimal post phlebitic symptoms.  Patient is currently undergoing oncological treatments. She is still on Eliquis.   She denies any fever, chills, nausea and vomiting.    Non Invasive studies today show no evidence of DVT in Right lower extremity.    Past Medical History:  Diagnosis Date  . Carotid artery plaque, right 01/2014  . CKD (chronic kidney disease)    stage 2-3  . Diabetes mellitus without complication ([place])   . DM (diabetes mellitus), type 2, uncontrolled ([place])   . Hyperlipidemia   . Hypertension   . Hypochromic microcytic anemia    mild  . Metastatic urothelial carcinoma ([place]) 03/23/2019  . Osteoporosis   . Renal insufficiency   . Rotator cuff tendonitis, right     Past Surgical History:  Procedure Laterality Date  . ABDOMINAL HYSTERECTOMY  2000   due to bleeding and fibroids, partial- still has ovaries  . CYSTOSCOPY W/ RETROGRADES Bilateral 07/16/2018   Procedure: CYSTOSCOPY WITH RETROGRADE PYELOGRAM;  Surgeon: Billey Co, MD;  Location: [facility];  Service: Urology;  Laterality: Bilateral;  . IVC FILTER INSERTION N/A 03/30/2019   Procedure: IVC FILTER INSERTION;  Surgeon: Algernon Huxley, MD;  Location: [place] [facility];  Service: Cardiovascular;  Laterality: N/A;  . KNEE SURGERY Left 03/17/2013   torn meniscus  . PERIPHERAL VASCULAR THROMBECTOMY Right 03/30/2019   Procedure: PERIPHERAL VASCULAR THROMBECTOMY;  Surgeon: Algernon Huxley, MD;  Location: [place] [facility];  Service: Cardiovascular;  Laterality: Right;  . PORTA CATH INSERTION N/A 08/06/2018   Procedure: PORTA CATH INSERTION;   Surgeon: Algernon Huxley, MD;  Location: [place] [facility];  Service: Cardiovascular;  Laterality: N/A;  . TRANSURETHRAL RESECTION OF BLADDER TUMOR N/A 07/16/2018   Procedure: TRANSURETHRAL RESECTION OF BLADDER TUMOR (TURBT);  Surgeon: Billey Co, MD;  Location: [facility];  Service: Urology;  Laterality: N/A;    Social History   Socioeconomic History  . Marital status: Single    Spouse name: Not on file  . Number of children: 1  . Years of education: Not on file  . Highest education level: Not on file  Occupational History  . Not on file  Tobacco Use  . Smoking status: Former Smoker    Quit date: 06/05/1991    Years since quitting: 28.0  . Smokeless tobacco: Never Used  Substance and Sexual Activity  . Alcohol use: No  . Drug use: No  . Sexual activity: Never  Other Topics Concern  . Not on file  Social History Narrative   Working full time   Social Determinants of Radio broadcast assistant Strain:   . Difficulty of Paying Living Expenses: Not on file  Food Insecurity:   . Worried About Charity fundraiser in the Last Year: Not on file  . Ran Out of Food in the Last Year: Not on file  Transportation Needs:   . Lack of Transportation (Medical): Not on file  . Lack of Transportation (Non-Medical): Not on file  Physical Activity:   . Days of Exercise per Week: Not  on file  . Minutes of Exercise per Session: Not on file  Stress:   . Feeling of Stress : Not on file  Social Connections:   . Frequency of Communication with Friends and Family: Not on file  . Frequency of Social Gatherings with Friends and Family: Not on file  . Attends Religious Services: Not on file  . Active Member of Clubs or Organizations: Not on file  . Attends Archivist Meetings: Not on file  . Marital Status: Not on file  Intimate Partner Violence:   . Fear of Current or Ex-Partner: Not on file  . Emotionally Abused: Not on file  . Physically Abused: Not on file  . Sexually Abused:  Not on file    Family History  Problem Relation Age of Onset  . Heart disease Mother   . Heart attack Mother   . Arthritis Father   . Diabetes Brother     No Known Allergies   Review of Systems   Review of Systems: Negative Unless Checked Constitutional: [] Weight loss  [] Fever  [] Chills Cardiac: [] Chest pain   []  Atrial Fibrillation  [] Palpitations   [] Shortness of breath when laying flat   [] Shortness of breath with exertion. [] Shortness of breath at rest Vascular:  [] Pain in legs with walking   [] Pain in legs with standing [] Pain in legs when laying flat   [] Claudication    [] Pain in feet when laying flat    [x] History of DVT   [] Phlebitis   [] Swelling in legs   [] Varicose veins   [] Non-healing ulcers Pulmonary:   [] Uses home oxygen   [] Productive cough   [] Hemoptysis   [] Wheeze  [] COPD   [] Asthma Neurologic:  [] Dizziness   [] Seizures  [] Blackouts [] History of stroke   [] History of TIA  [] Aphasia   [] Temporary Blindness   [] Weakness or numbness in arm   [] Weakness or numbness in leg Musculoskeletal:   [] Joint swelling   [] Joint pain   [] Low back pain  []  History of Knee Replacement [] Arthritis [] back Surgeries  []  Spinal Stenosis    Hematologic:  [] Easy bruising  [] Easy bleeding   [x] Hypercoagulable state   [x] Anemic Gastrointestinal:  [] Diarrhea   [] Vomiting  [] Gastroesophageal reflux/heartburn   [] Difficulty swallowing. [] Abdominal pain Genitourinary:  [x] Chronic kidney disease   [] Difficult urination  [] Anuric   [] Blood in urine [] Frequent urination  [] Burning with urination   [] Hematuria Skin:  [] Rashes   [] Ulcers [] Wounds Psychological:  [] History of anxiety   []  History of major depression  []  Memory Difficulties      OBJECTIVE:   Physical Exam  BP (!) 170/68 (BP Location: Right Arm)   Pulse (!) 102   Resp 16   Wt 189 lb (85.7 kg)   BMI 33.48 kg/m   Gen: WD/WN, NAD Head: /AT, No temporalis wasting.  Ear/Nose/Throat: Hearing grossly intact, nares w/o erythema or  drainage Eyes: PER, EOMI, sclera nonicteric.  Neck: Supple, no masses.  No JVD.  Pulmonary:  Good air movement, no use of accessory muscles.  Cardiac: RRR Vascular: minimal edema  Vessel Right Left  Radial Palpable Palpable   Gastrointestinal: soft, non-distended. No guarding/no peritoneal signs.  Musculoskeletal: M/S 5/5 throughout.  No deformity or atrophy.  Neurologic: Pain and light touch intact in extremities.  Symmetrical.  Speech is fluent. Motor exam as listed above. Psychiatric: Judgment intact, Mood & affect appropriate for pt's clinical situation. Dermatologic: No Venous rashes. No Ulcers Noted.  No changes consistent with cellulitis. Lymph : No Cervical lymphadenopathy, no  lichenification or skin changes of chronic lymphedema.       ASSESSMENT AND PLAN:  1. Acute deep vein thrombosis (DVT) of iliac vein of right lower extremity (HCC) DVT is resolved at this time.  Patient currently has IVC filter in place.  Discussed IVC filter removal with patient, including risks/benefits and alternatives.  Patient agrees to proceed with IVC filter removal.    2. Mixed hyperlipidemia Continue statin as ordered and reviewed, no changes at this time   3. Controlled type 2 diabetes mellitus with complication, without long-term current use of insulin (HCC) Continue hypoglycemic medications as already ordered, these medications have been reviewed and there are no changes at this time.  Hgb A1C to be monitored as already arranged by primary service    Current Outpatient Medications on File Prior to Visit  Medication Sig Dispense Refill  . amLODipine (NORVASC) 5 MG tablet Take 1 tablet (5 mg total) by mouth daily. 90 tablet 1  . apixaban (ELIQUIS) 5 MG TABS tablet Take 1 tablet (5 mg total) by mouth 2 (two) times daily. 60 tablet 3  . diclofenac sodium (VOLTAREN) 1 % GEL Apply 2 g topically 4 (four) times daily. 100 g 2  . lidocaine-prilocaine (EMLA) cream Apply to affected area once 30  g 3  . polyethylene glycol (MIRALAX / GLYCOLAX) 17 g packet Take 17 g by mouth daily as needed for mild constipation. 14 each 0  . pravastatin (PRAVACHOL) 10 MG tablet Take 1 tablet (10 mg total) by mouth at bedtime. 90 tablet 1  . senna (SENOKOT) 8.6 MG TABS tablet Take 2 tablets (17.2 mg total) by mouth daily. 60 tablet 0  . [DISCONTINUED] prochlorperazine (COMPAZINE) 10 MG tablet Take 1 tablet (10 mg total) by mouth every 6 (six) hours as needed (Nausea or vomiting). 30 tablet 1   No current facility-administered medications on file prior to visit.    There are no Patient Instructions on file for this visit. No follow-ups on file.   Kris Hartmann, NP  This note was completed with Sales executive.  Any errors are purely unintentional.

## 2019-06-10 ENCOUNTER — Inpatient Hospital Stay: Payer: Medicare Other | Admitting: Hospice and Palliative Medicine

## 2019-06-11 ENCOUNTER — Other Ambulatory Visit
Admission: RE | Admit: 2019-06-11 | Discharge: 2019-06-11 | Disposition: A | Discharge status: Home / Self Care | Payer: Medicare Other | Source: Ambulatory Visit | Attending: Vascular Surgery | Admitting: Vascular Surgery

## 2019-06-11 ENCOUNTER — Other Ambulatory Visit: Payer: Self-pay

## 2019-06-11 DIAGNOSIS — Z01812 Encounter for preprocedural laboratory examination: Secondary | ICD-10-CM | POA: Insufficient documentation

## 2019-06-11 DIAGNOSIS — Z20822 Contact with and (suspected) exposure to covid-19: Secondary | ICD-10-CM | POA: Diagnosis not present

## 2019-06-12 LAB — SARS CORONAVIRUS 2 (TAT 6-24 HRS): SARS Coronavirus 2: NEGATIVE

## 2019-06-15 ENCOUNTER — Ambulatory Visit
Admission: RE | Admit: 2019-06-15 | Discharge: 2019-06-15 | Disposition: A | Discharge status: Home / Self Care | Payer: Medicare Other | Attending: Vascular Surgery | Admitting: Vascular Surgery

## 2019-06-15 ENCOUNTER — Encounter: Payer: Self-pay | Admitting: Vascular Surgery

## 2019-06-15 ENCOUNTER — Other Ambulatory Visit (INDEPENDENT_AMBULATORY_CARE_PROVIDER_SITE_OTHER): Payer: Self-pay | Admitting: Nurse Practitioner

## 2019-06-15 ENCOUNTER — Encounter
Admission: RE | Disposition: A | Discharge status: Home / Self Care | Payer: Self-pay | Source: Home / Self Care | Attending: Vascular Surgery

## 2019-06-15 ENCOUNTER — Other Ambulatory Visit: Payer: Self-pay

## 2019-06-15 DIAGNOSIS — I6521 Occlusion and stenosis of right carotid artery: Secondary | ICD-10-CM | POA: Diagnosis not present

## 2019-06-15 DIAGNOSIS — Z7901 Long term (current) use of anticoagulants: Secondary | ICD-10-CM | POA: Diagnosis not present

## 2019-06-15 DIAGNOSIS — Z87891 Personal history of nicotine dependence: Secondary | ICD-10-CM | POA: Insufficient documentation

## 2019-06-15 DIAGNOSIS — M810 Age-related osteoporosis without current pathological fracture: Secondary | ICD-10-CM | POA: Insufficient documentation

## 2019-06-15 DIAGNOSIS — Z79899 Other long term (current) drug therapy: Secondary | ICD-10-CM | POA: Insufficient documentation

## 2019-06-15 DIAGNOSIS — N183 Chronic kidney disease, stage 3 unspecified: Secondary | ICD-10-CM | POA: Insufficient documentation

## 2019-06-15 DIAGNOSIS — Z86718 Personal history of other venous thrombosis and embolism: Secondary | ICD-10-CM | POA: Insufficient documentation

## 2019-06-15 DIAGNOSIS — I129 Hypertensive chronic kidney disease with stage 1 through stage 4 chronic kidney disease, or unspecified chronic kidney disease: Secondary | ICD-10-CM | POA: Insufficient documentation

## 2019-06-15 DIAGNOSIS — E1122 Type 2 diabetes mellitus with diabetic chronic kidney disease: Secondary | ICD-10-CM | POA: Insufficient documentation

## 2019-06-15 DIAGNOSIS — E785 Hyperlipidemia, unspecified: Secondary | ICD-10-CM | POA: Insufficient documentation

## 2019-06-15 DIAGNOSIS — I82409 Acute embolism and thrombosis of unspecified deep veins of unspecified lower extremity: Secondary | ICD-10-CM

## 2019-06-15 DIAGNOSIS — Z452 Encounter for adjustment and management of vascular access device: Secondary | ICD-10-CM | POA: Insufficient documentation

## 2019-06-15 DIAGNOSIS — I82501 Chronic embolism and thrombosis of unspecified deep veins of right lower extremity: Secondary | ICD-10-CM | POA: Diagnosis not present

## 2019-06-15 HISTORY — PX: IVC FILTER REMOVAL: CATH118246

## 2019-06-15 LAB — GLUCOSE, CAPILLARY
Glucose-Capillary: 137 mg/dL — ABNORMAL HIGH (ref 70–99)
Glucose-Capillary: 155 mg/dL — ABNORMAL HIGH (ref 70–99)

## 2019-06-15 SURGERY — IVC FILTER REMOVAL
Anesthesia: Moderate Sedation

## 2019-06-15 MED ORDER — FENTANYL CITRATE (PF) 100 MCG/2ML IJ SOLN
INTRAMUSCULAR | Status: DC | PRN
  Administered 2019-06-15: 25 ug via INTRAVENOUS
  Administered 2019-06-15: 50 ug via INTRAVENOUS
  Administered 2019-06-15: 25 ug via INTRAVENOUS

## 2019-06-15 MED ORDER — SODIUM CHLORIDE 0.9 % IV SOLN: INTRAVENOUS | Status: DC

## 2019-06-15 MED ORDER — FAMOTIDINE 20 MG PO TABS: 40.0000 mg | ORAL_TABLET | Freq: Once | ORAL | Status: DC | PRN

## 2019-06-15 MED ORDER — DIPHENHYDRAMINE HCL 50 MG/ML IJ SOLN: 50.0000 mg | Freq: Once | INTRAMUSCULAR | Status: DC | PRN

## 2019-06-15 MED ORDER — METHYLPREDNISOLONE SODIUM SUCC 125 MG IJ SOLR: 125.0000 mg | Freq: Once | INTRAMUSCULAR | Status: DC | PRN

## 2019-06-15 MED ORDER — HYDROMORPHONE HCL 1 MG/ML IJ SOLN: 1.0000 mg | Freq: Once | INTRAMUSCULAR | Status: DC | PRN

## 2019-06-15 MED ORDER — HEPARIN SOD (PORK) LOCK FLUSH 100 UNIT/ML IV SOLN
INTRAVENOUS | Status: AC
  Administered 2019-06-15: 10:00:00 500 [IU]
  Filled 2019-06-15: qty 5

## 2019-06-15 MED ORDER — MIDAZOLAM HCL 2 MG/2ML IJ SOLN
INTRAMUSCULAR | Status: DC | PRN
  Administered 2019-06-15: 1 mg via INTRAVENOUS
  Administered 2019-06-15: 2 mg via INTRAVENOUS
  Administered 2019-06-15: 1 mg via INTRAVENOUS

## 2019-06-15 MED ORDER — CEFAZOLIN SODIUM-DEXTROSE 2-4 GM/100ML-% IV SOLN
2.0000 g | Freq: Once | INTRAVENOUS | Status: AC
  Administered 2019-06-15: 2 g via INTRAVENOUS

## 2019-06-15 MED ORDER — HEPARIN SOD (PORK) LOCK FLUSH 10 UNIT/ML IV SOLN
10.0000 [IU] | Freq: Once | INTRAVENOUS | Status: DC
  Filled 2019-06-15: qty 1

## 2019-06-15 MED ORDER — IODIXANOL 320 MG/ML IV SOLN
INTRAVENOUS | Status: DC | PRN
  Administered 2019-06-15: 15 mL

## 2019-06-15 MED ORDER — MIDAZOLAM HCL 5 MG/5ML IJ SOLN
INTRAMUSCULAR | Status: AC
  Filled 2019-06-15: qty 5

## 2019-06-15 MED ORDER — MIDAZOLAM HCL 2 MG/ML PO SYRP: 8.0000 mg | ORAL_SOLUTION | Freq: Once | ORAL | Status: DC | PRN

## 2019-06-15 MED ORDER — ONDANSETRON HCL 4 MG/2ML IJ SOLN: 4.0000 mg | Freq: Four times a day (QID) | INTRAMUSCULAR | Status: DC | PRN

## 2019-06-15 MED ORDER — FENTANYL CITRATE (PF) 100 MCG/2ML IJ SOLN
INTRAMUSCULAR | Status: AC
  Filled 2019-06-15: qty 2

## 2019-06-15 SURGICAL SUPPLY — 4 items
CATH BEACON 5 .038 100 VERT TP (CATHETERS) ×2 IMPLANT
PACK ANGIOGRAPHY (CUSTOM PROCEDURE TRAY) ×2 IMPLANT
SET VENACAVA FILTER RETRIEVAL (MISCELLANEOUS) ×2 IMPLANT
WIRE J 3MM .035X145CM (WIRE) ×2 IMPLANT

## 2019-06-15 NOTE — H&P (Signed)
[facility] History & Physical Update  The patient was interviewed and re-examined.  The patient's previous History and Physical has been reviewed and is unchanged.  There is no change in the plan of care. We plan to proceed with the scheduled procedure.  Leotis Pain, MD  06/15/2019, 9:28 AM

## 2019-06-15 NOTE — Discharge Instructions (Signed)
Inferior Vena Cava Filter Removal, Care After This sheet gives you information about how to care for yourself after your procedure. Your health care provider may also give you more specific instructions. If you have problems or questions, contact your health care provider. What can I expect after the procedure? After the procedure, it is common to have:  Mild pain and bruising around your incision in your neck or groin.  Fatigue. Follow these instructions at home: Incision care   Follow instructions from your health care provider about how to take care of your incision. Make sure you: ? Wash your hands with soap and water before you change your bandage (dressing). If soap and water are not available, use hand sanitizer. ? Change your dressing as told by your health care provider.  Check your incision area every day for signs of infection. Check for: ? Redness, swelling, or more pain. ? Fluid or blood. ? Warmth. ? Pus or a bad smell. General instructions  Take over-the-counter and prescription medicines only as told by your health care provider.  Do not take baths, swim, or use a hot tub until your health care provider approves. Ask your health care provider if you may take showers. You may only be allowed to take sponge baths.  Do not drive for 24 hours if you were given a medicine to help you relax (sedative) during your procedure.  Return to your normal activities as told by your health care provider. Ask your health care provider what activities are safe for you.  Keep all follow-up visits as told by your health care provider. This is important. Contact a health care provider if:  You have chills or a fever.  You have redness, swelling, or more pain around your incision.  Your incision feels warm to the touch.  You have pus or a bad smell coming from your incision. Get help right away if:  You have blood coming from your incision (active bleeding). ? If you have  bleeding from the incision site, lie down, apply pressure to the area with a clean cloth or gauze, and get help right away.  You have chest pain.  You have difficulty breathing. Summary  Follow instructions from your health care provider about how to take care of your incision.  Return to your normal activities as told by your health care provider.  Check your incision area every day for signs of infection.  Get help right away if you have active bleeding, chest pain, or trouble breathing. This information is not intended to replace advice given to you by your health care provider. Make sure you discuss any questions you have with your health care provider. Document Revised: 05/03/2017 Document Reviewed: 11/29/2016 Elsevier Patient Education  2020 Elsevier Inc.    Moderate Conscious Sedation, Adult, Care After These instructions provide you with information about caring for yourself after your procedure. Your health care provider may also give you more specific instructions. Your treatment has been planned according to current medical practices, but problems sometimes occur. Call your health care provider if you have any problems or questions after your procedure. What can I expect after the procedure? After your procedure, it is common:  To feel sleepy for several hours.  To feel clumsy and have poor balance for several hours.  To have poor judgment for several hours.  To vomit if you eat too soon. Follow these instructions at home: For at least 24 hours after the procedure:   Do not: ?   Participate in activities where you could fall or become injured. ? Drive. ? Use heavy machinery. ? Drink alcohol. ? Take sleeping pills or medicines that cause drowsiness. ? Make important decisions or sign legal documents. ? Take care of children on your own.  Rest. Eating and drinking  Follow the diet recommended by your health care provider.  If you vomit: ? Drink water, juice,  or soup when you can drink without vomiting. ? Make sure you have little or no nausea before eating solid foods. General instructions  Have a responsible adult stay with you until you are awake and alert.  Take over-the-counter and prescription medicines only as told by your health care provider.  If you smoke, do not smoke without supervision.  Keep all follow-up visits as told by your health care provider. This is important. Contact a health care provider if:  You keep feeling nauseous or you keep vomiting.  You feel light-headed.  You develop a rash.  You have a fever. Get help right away if:  You have trouble breathing. This information is not intended to replace advice given to you by your health care provider. Make sure you discuss any questions you have with your health care provider. Document Revised: 05/03/2017 Document Reviewed: 09/10/2015 Elsevier Patient Education  2020 Elsevier Inc.  

## 2019-06-15 NOTE — Op Note (Signed)
[facility]   OPERATIVE NOTE    PRE-OPERATIVE DIAGNOSIS:  1. DVT 2. status post IVC filter placement and right iliac venous stent placement  POST-OPERATIVE DIAGNOSIS: Same as above  PROCEDURE: 1. Ultrasound guidance for vascular access left jugular vein 2. Catheter placement into right iliac vein from left jugular vein 3. Inferior venacavogram and right iliofemoral venogram 4. Retrieval of a [place] IVC filter  SURGEON: Leotis Pain, MD  ASSISTANT(S): None  ANESTHESIA: Local with moderate conscious sedation for approximately 15 minutes using 4 mg of Versed and 100 mcg of Fentanyl  ESTIMATED BLOOD LOSS: 5 cc  CONTRAST:  15 cc  FLUORO TIME: 1.9 minutes  FINDING(S): 1. Patent right femoral iliac vein including the stent.  IVC widely patent with no thrombus in the IVC filter which was positioned well.  SPECIMEN(S): IVC filter  INDICATIONS:  Patient is a 69 y.o. female who presents with a previous history of IVC filter placement and right iliac venous stent placement for iliofemoral DVT with iliac vein stenosis. Patient has tolerated anticoagulation and markedly improved her symptoms and no longer needs this filter. The patient remains on anticoagulation. Risks and benefits were discussed, and informed consent was obtained.  DESCRIPTION: After obtaining full informed written consent, the patient was brought back to the vascular suite and placed supine upon the table.Moderate conscious sedation was administered during a face to face encounter with the patient throughout the procedure with my supervision of the RN administering medicines and monitoring the patient's vital signs, pulse oximetry, telemetry and mental status throughout from the start of the procedure until the patient was taken to the recovery room.  After obtaining adequate anesthesia, the patient was prepped and draped in the standard fashion. The left jugular vein was visualized  with ultrasound and found to be widely patent. It was then accessed under direct ultrasound guidance without difficulty with the Seldinger needle and a permanent image was recorded. A J-wire was placed. After skin nick and dilatation, the retrieval sheath was placed over the wire and advanced into the inferior vena cava.  I then used a J-wire and [place] advanced down to the bottom of the right iliac vein stent in the right common femoral vein.  Imaging was performed showing the right femoral vein and iliac vein stent to be widely patent.  Inferior vena cava was imaged and found to be widely patent on inferior venacavogram. The filter was straight in its orientation. The retrieval snare was then placed through the sheath and the hook of the filter was snared without difficulty. The sheath was then advanced, and the filter was collapsed and brought into the sheath in its entirety. It was then removed from the body in its entirety. The retrieval sheath was then removed. Pressure was held at the access site and sterile dressing was placed. The patient was taken to the recovery room in stable condition having tolerated the procedure well.  COMPLICATIONS: None  CONDITION: Stable   Leotis Pain 06/15/2019 8:54 AM  This note was created with Dragon Medical transcription system. Any errors in dictation are purely unintentional.

## 2019-06-18 ENCOUNTER — Ambulatory Visit
Admission: RE | Admit: 2019-06-18 | Discharge: 2019-06-18 | Disposition: A | Discharge status: Home / Self Care | Payer: Medicare Other | Source: Ambulatory Visit | Attending: Oncology | Admitting: Oncology

## 2019-06-18 ENCOUNTER — Other Ambulatory Visit: Payer: Self-pay

## 2019-06-18 DIAGNOSIS — C7910 Secondary malignant neoplasm of unspecified urinary organs: Secondary | ICD-10-CM | POA: Insufficient documentation

## 2019-06-18 DIAGNOSIS — I2510 Atherosclerotic heart disease of native coronary artery without angina pectoris: Secondary | ICD-10-CM | POA: Diagnosis not present

## 2019-06-18 DIAGNOSIS — C679 Malignant neoplasm of bladder, unspecified: Secondary | ICD-10-CM | POA: Diagnosis not present

## 2019-06-18 LAB — GLUCOSE, CAPILLARY: Glucose-Capillary: 127 mg/dL — ABNORMAL HIGH (ref 70–99)

## 2019-06-18 MED ORDER — FLUDEOXYGLUCOSE F - 18 (FDG) INJECTION
9.8000 | Freq: Once | INTRAVENOUS | Status: AC | PRN
  Administered 2019-06-18: 10.309 via INTRAVENOUS

## 2019-06-22 ENCOUNTER — Ambulatory Visit (INDEPENDENT_AMBULATORY_CARE_PROVIDER_SITE_OTHER): Payer: Medicare Other

## 2019-06-22 ENCOUNTER — Encounter: Payer: Self-pay | Admitting: Family Medicine

## 2019-06-22 ENCOUNTER — Other Ambulatory Visit: Payer: Self-pay

## 2019-06-22 ENCOUNTER — Ambulatory Visit (INDEPENDENT_AMBULATORY_CARE_PROVIDER_SITE_OTHER): Payer: Medicare Other | Admitting: Family Medicine

## 2019-06-22 VITALS — BP 169/83 | HR 80 | Temp 97.5°F | Resp 16 | Ht 61.0 in | Wt 193.8 lb

## 2019-06-22 VITALS — BP 138/70 | HR 80 | Temp 97.5°F | Ht 61.0 in | Wt 193.8 lb

## 2019-06-22 DIAGNOSIS — C7910 Secondary malignant neoplasm of unspecified urinary organs: Secondary | ICD-10-CM | POA: Diagnosis not present

## 2019-06-22 DIAGNOSIS — E1159 Type 2 diabetes mellitus with other circulatory complications: Secondary | ICD-10-CM

## 2019-06-22 DIAGNOSIS — Z1159 Encounter for screening for other viral diseases: Secondary | ICD-10-CM

## 2019-06-22 DIAGNOSIS — E782 Mixed hyperlipidemia: Secondary | ICD-10-CM

## 2019-06-22 DIAGNOSIS — I10 Essential (primary) hypertension: Secondary | ICD-10-CM | POA: Diagnosis not present

## 2019-06-22 DIAGNOSIS — I6521 Occlusion and stenosis of right carotid artery: Secondary | ICD-10-CM | POA: Diagnosis not present

## 2019-06-22 DIAGNOSIS — Z0000 Encounter for general adult medical examination without abnormal findings: Secondary | ICD-10-CM

## 2019-06-22 DIAGNOSIS — Z23 Encounter for immunization: Secondary | ICD-10-CM

## 2019-06-22 DIAGNOSIS — Z86718 Personal history of other venous thrombosis and embolism: Secondary | ICD-10-CM

## 2019-06-22 DIAGNOSIS — I152 Hypertension secondary to endocrine disorders: Secondary | ICD-10-CM

## 2019-06-22 DIAGNOSIS — E118 Type 2 diabetes mellitus with unspecified complications: Secondary | ICD-10-CM

## 2019-06-22 NOTE — Patient Instructions (Signed)
Angela Russell , Thank you for taking time to come for your Medicare Wellness Visit. I appreciate your ongoing commitment to your health goals. Please review the following plan we discussed and let me know if I can assist you in the future.   Screening recommendations/referrals: Colonoscopy: declined  Mammogram: declined  Bone Density: declined  Recommended yearly ophthalmology/optometry visit for glaucoma screening and checkup Recommended yearly dental visit for hygiene and checkup  Vaccinations: Influenza vaccine: done today  Pneumococcal vaccine: done today Tdap vaccine: due now  Shingles vaccine: shingrix eligible     Advanced directives: Please bring a copy of your health care power of attorney and living will to the office at your convenience.  Conditions/risks identified: history of diabetes   Next appointment: Follow up in one year for your annual wellness visit    Preventive Care 65 Years and Older, Female Preventive care refers to lifestyle choices and visits with your health care provider that can promote health and wellness. What does preventive care include?  A yearly physical exam. This is also called an annual well check.  Dental exams once or twice a year.  Routine eye exams. Ask your health care provider how often you should have your eyes checked.  Personal lifestyle choices, including:  Daily care of your teeth and gums.  Regular physical activity.  Eating a healthy diet.  Avoiding tobacco and drug use.  Limiting alcohol use.  Practicing safe sex.  Taking low-dose aspirin every day.  Taking vitamin and mineral supplements as recommended by your health care provider. What happens during an annual well check? The services and screenings done by your health care provider during your annual well check will depend on your age, overall health, lifestyle risk factors, and family history of disease. Counseling  Your health care provider may ask you  questions about your:  Alcohol use.  Tobacco use.  Drug use.  Emotional well-being.  Home and relationship well-being.  Sexual activity.  Eating habits.  History of falls.  Memory and ability to understand (cognition).  Work and work Statistician.  Reproductive health. Screening  You may have the following tests or measurements:  Height, weight, and BMI.  Blood pressure.  Lipid and cholesterol levels. These may be checked every 5 years, or more frequently if you are over 45 years old.  Skin check.  Lung cancer screening. You may have this screening every year starting at age 105 if you have a 30-pack-year history of smoking and currently smoke or have quit within the past 15 years.  Fecal occult blood test (FOBT) of the stool. You may have this test every year starting at age 72.  Flexible sigmoidoscopy or colonoscopy. You may have a sigmoidoscopy every 5 years or a colonoscopy every 10 years starting at age 82.  Hepatitis C blood test.  Hepatitis B blood test.  Sexually transmitted disease (STD) testing.  Diabetes screening. This is done by checking your blood sugar (glucose) after you have not eaten for a while (fasting). You may have this done every 1-3 years.  Bone density scan. This is done to screen for osteoporosis. You may have this done starting at age 70.  Mammogram. This may be done every 1-2 years. Talk to your health care provider about how often you should have regular mammograms. Talk with your health care provider about your test results, treatment options, and if necessary, the need for more tests. Vaccines  Your health care provider may recommend certain vaccines, such as:  Influenza vaccine. This is recommended every year.  Tetanus, diphtheria, and acellular pertussis (Tdap, Td) vaccine. You may need a Td booster every 10 years.  Zoster vaccine. You may need this after age 19.  Pneumococcal 13-valent conjugate (PCV13) vaccine. One dose is  recommended after age 32.  Pneumococcal polysaccharide (PPSV23) vaccine. One dose is recommended after age 51. Talk to your health care provider about which screenings and vaccines you need and how often you need them. This information is not intended to replace advice given to you by your health care provider. Make sure you discuss any questions you have with your health care provider. Document Released: 06/17/2015 Document Revised: 02/08/2016 Document Reviewed: 03/22/2015 Elsevier Interactive Patient Education  2017 [place] Prevention in the Home Falls can cause injuries. They can happen to people of all ages. There are many things you can do to make your home safe and to help prevent falls. What can I do on the outside of my home?  Regularly fix the edges of walkways and driveways and fix any cracks.  Remove anything that might make you trip as you walk through a door, such as a raised step or threshold.  Trim any bushes or trees on the path to your home.  Use bright outdoor lighting.  Clear any walking paths of anything that might make someone trip, such as rocks or tools.  Regularly check to see if handrails are loose or broken. Make sure that both sides of any steps have handrails.  Any raised decks and porches should have guardrails on the edges.  Have any leaves, snow, or ice cleared regularly.  Use sand or salt on walking paths during winter.  Clean up any spills in your garage right away. This includes oil or grease spills. What can I do in the bathroom?  Use night lights.  Install grab bars by the toilet and in the tub and shower. Do not use towel bars as grab bars.  Use non-skid mats or decals in the tub or shower.  If you need to sit down in the shower, use a plastic, non-slip stool.  Keep the floor dry. Clean up any water that spills on the floor as soon as it happens.  Remove soap buildup in the tub or shower regularly.  Attach bath mats  securely with double-sided non-slip rug tape.  Do not have throw rugs and other things on the floor that can make you trip. What can I do in the bedroom?  Use night lights.  Make sure that you have a light by your bed that is easy to reach.  Do not use any sheets or blankets that are too big for your bed. They should not hang down onto the floor.  Have a firm chair that has side arms. You can use this for support while you get dressed.  Do not have throw rugs and other things on the floor that can make you trip. What can I do in the kitchen?  Clean up any spills right away.  Avoid walking on wet floors.  Keep items that you use a lot in easy-to-reach places.  If you need to reach something above you, use a strong step stool that has a grab bar.  Keep electrical cords out of the way.  Do not use floor polish or wax that makes floors slippery. If you must use wax, use non-skid floor wax.  Do not have throw rugs and other things on the floor that  can make you trip. What can I do with my stairs?  Do not leave any items on the stairs.  Make sure that there are handrails on both sides of the stairs and use them. Fix handrails that are broken or loose. Make sure that handrails are as long as the stairways.  Check any carpeting to make sure that it is firmly attached to the stairs. Fix any carpet that is loose or worn.  Avoid having throw rugs at the top or bottom of the stairs. If you do have throw rugs, attach them to the floor with carpet tape.  Make sure that you have a light switch at the top of the stairs and the bottom of the stairs. If you do not have them, ask someone to add them for you. What else can I do to help prevent falls?  Wear shoes that:  Do not have high heels.  Have rubber bottoms.  Are comfortable and fit you well.  Are closed at the toe. Do not wear sandals.  If you use a stepladder:  Make sure that it is fully opened. Do not climb a closed  stepladder.  Make sure that both sides of the stepladder are locked into place.  Ask someone to hold it for you, if possible.  Clearly mark and make sure that you can see:  Any grab bars or handrails.  First and last steps.  Where the edge of each step is.  Use tools that help you move around (mobility aids) if they are needed. These include:  Canes.  Walkers.  Scooters.  Crutches.  Turn on the lights when you go into a dark area. Replace any light bulbs as soon as they burn out.  Set up your furniture so you have a clear path. Avoid moving your furniture around.  If any of your floors are uneven, fix them.  If there are any pets around you, be aware of where they are.  Review your medicines with your doctor. Some medicines can make you feel dizzy. This can increase your chance of falling. Ask your doctor what other things that you can do to help prevent falls. This information is not intended to replace advice given to you by your health care provider. Make sure you discuss any questions you have with your health care provider. Document Released: 03/17/2009 Document Revised: 10/27/2015 Document Reviewed: 06/25/2014 Elsevier Interactive Patient Education  2017 [employer].

## 2019-06-22 NOTE — Progress Notes (Signed)
Subjective:   Angela Russell is a 69 y.o. female who presents for Medicare Annual (Subsequent) preventive examination.  Review of Systems: *Cardiac Risk Factors include: advanced age (>62men, >7 women);dyslipidemia;hypertension;diabetes mellitus;obesity (BMI >30kg/m2)     Objective:     Vitals: BP (!) 169/83 (BP Location: Left Arm, Patient Position: Sitting, Cuff Size: Normal)   Pulse 80   Temp (!) 97.5 F (36.4 C) (Oral)   Resp 16   Ht 5\' 1"  (1.549 m)   Wt 193 lb 12.8 oz (87.9 kg)   BMI 36.62 kg/m   Body mass index is 36.62 kg/m.  Advanced Directives 06/22/2019 06/15/2019 04/07/2019 04/07/2019 03/06/2019 03/03/2019 02/08/2019  Does Patient Have a Medical Advance Directive? Yes No No No No No No  Type of Advance Directive Living will;Healthcare Power of Attorney - - - - - -  Copy of [place] in Chart? No - copy requested - - - - - -  Would patient like information on creating a medical advance directive? - No - Patient declined - No - Patient declined No - Patient declined No - Patient declined No - Patient declined    Tobacco Social History   Tobacco Use  Smoking Status Former Smoker  . Quit date: 06/05/1991  . Years since quitting: 28.0  Smokeless Tobacco Never Used     Counseling given: Not Answered   Clinical Intake:  Pre-visit preparation completed: Yes  Pain : No/denies pain Pain Score: 0-No pain     Nutritional Status: BMI > 30  Obese Nutritional Risks: None Diabetes: Yes CBG done?: No Did pt. bring in CBG monitor from home?: No  How often do you need to have someone help you when you read instructions, pamphlets, or other written materials from your doctor or pharmacy?: 1 - Never    Interpreter Needed?: No  Information entered by :: Tiffany Hill,LPN  Past Medical History:  Diagnosis Date  . Carotid artery plaque, right 01/2014  . CKD (chronic kidney disease)    stage 2-3  . Diabetes mellitus without complication ([place])    . DM (diabetes mellitus), type 2, uncontrolled ([place])   . Hyperlipidemia   . Hypertension   . Hypochromic microcytic anemia    mild  . Metastatic urothelial carcinoma ([place]) 03/23/2019  . Osteoporosis   . Renal insufficiency   . Rotator cuff tendonitis, right    Past Surgical History:  Procedure Laterality Date  . ABDOMINAL HYSTERECTOMY  2000   due to bleeding and fibroids, partial- still has ovaries  . CYSTOSCOPY W/ RETROGRADES Bilateral 07/16/2018   Procedure: CYSTOSCOPY WITH RETROGRADE PYELOGRAM;  Surgeon: Billey Co, MD;  Location: [facility];  Service: Urology;  Laterality: Bilateral;  . IVC FILTER INSERTION N/A 03/30/2019   Procedure: IVC FILTER INSERTION;  Surgeon: Algernon Huxley, MD;  Location: [facility];  Service: Cardiovascular;  Laterality: N/A;  . IVC FILTER REMOVAL N/A 06/15/2019   Procedure: IVC FILTER REMOVAL;  Surgeon: Algernon Huxley, MD;  Location: [facility];  Service: Cardiovascular;  Laterality: N/A;  . KNEE SURGERY Left 03/17/2013   torn meniscus  . PERIPHERAL VASCULAR THROMBECTOMY Right 03/30/2019   Procedure: PERIPHERAL VASCULAR THROMBECTOMY;  Surgeon: Algernon Huxley, MD;  Location: [facility];  Service: Cardiovascular;  Laterality: Right;  . PORTA CATH INSERTION N/A 08/06/2018   Procedure: PORTA CATH INSERTION;  Surgeon: Algernon Huxley, MD;  Location: [facility];  Service: Cardiovascular;  Laterality: N/A;  .  TRANSURETHRAL RESECTION OF BLADDER TUMOR N/A 07/16/2018   Procedure: TRANSURETHRAL RESECTION OF BLADDER TUMOR (TURBT);  Surgeon: Billey Co, MD;  Location: [facility];  Service: Urology;  Laterality: N/A;   Family History  Problem Relation Age of Onset  . Heart disease Mother   . Heart attack Mother   . Arthritis Father   . Diabetes Brother    Social History   Socioeconomic History  . Marital status: Single    Spouse name: Not on file  . Number of children: 1  . Years of education: Not on file  . Highest  education level: 10th grade  Occupational History  . Not on file  Tobacco Use  . Smoking status: Former Smoker    Quit date: 06/05/1991    Years since quitting: 28.0  . Smokeless tobacco: Never Used  Substance and Sexual Activity  . Alcohol use: No  . Drug use: No  . Sexual activity: Never  Other Topics Concern  . Not on file  Social History Narrative   Working full time   Social Determinants of Radio broadcast assistant Strain:   . Difficulty of Paying Living Expenses: Not on file  Food Insecurity:   . Worried About Charity fundraiser in the Last Year: Not on file  . Ran Out of Food in the Last Year: Not on file  Transportation Needs:   . Lack of Transportation (Medical): Not on file  . Lack of Transportation (Non-Medical): Not on file  Physical Activity:   . Days of Exercise per Week: Not on file  . Minutes of Exercise per Session: Not on file  Stress:   . Feeling of Stress : Not on file  Social Connections:   . Frequency of Communication with Friends and Family: Not on file  . Frequency of Social Gatherings with Friends and Family: Not on file  . Attends Religious Services: Not on file  . Active Member of Clubs or Organizations: Not on file  . Attends Archivist Meetings: Not on file  . Marital Status: Not on file    Outpatient Encounter Medications as of 06/22/2019  Medication Sig  . amLODipine (NORVASC) 5 MG tablet Take 1 tablet (5 mg total) by mouth daily.  Marland Kitchen apixaban (ELIQUIS) 5 MG TABS tablet Take 1 tablet (5 mg total) by mouth 2 (two) times daily.  . diclofenac sodium (VOLTAREN) 1 % GEL Apply 2 g topically 4 (four) times daily.  Marland Kitchen lidocaine-prilocaine (EMLA) cream Apply to affected area once  . polyethylene glycol (MIRALAX / GLYCOLAX) 17 g packet Take 17 g by mouth daily as needed for mild constipation.  . pravastatin (PRAVACHOL) 10 MG tablet Take 1 tablet (10 mg total) by mouth at bedtime.  . [DISCONTINUED] prochlorperazine (COMPAZINE) 10 MG tablet  Take 1 tablet (10 mg total) by mouth every 6 (six) hours as needed (Nausea or vomiting).  . [DISCONTINUED] senna (SENOKOT) 8.6 MG TABS tablet Take 2 tablets (17.2 mg total) by mouth daily. (Patient not taking: Reported on 06/22/2019)   No facility-administered encounter medications on file as of 06/22/2019.    Activities of Daily Living In your present state of health, do you have any difficulty performing the following activities: 06/22/2019 06/15/2019  Hearing? N N  Comment no hearing aids -  Vision? N N  Comment contacts, Dr.Nice -  Difficulty concentrating or making decisions? N N  Walking or climbing stairs? N N  Dressing or bathing? N N  Doing errands, shopping? N -  Preparing Food and eating ? N -  Using the Toilet? N -  In the past six months, have you accidently leaked urine? N -  Do you have problems with loss of bowel control? N -  Managing your Medications? N -  Managing your Finances? N -  Housekeeping or managing your Housekeeping? N -  Some recent data might be hidden    Patient Care Team: Volney American, PA-C as PCP - General (Family Medicine) Idelle Leech, OD [facility])    Assessment:   This is a routine wellness examination for [facility].  Exercise Activities and Dietary recommendations Current Exercise Habits: Home exercise routine, Time (Minutes): 30, Frequency (Times/Week): 2, Weekly Exercise (Minutes/Week): 60, Intensity: Mild, Exercise limited by: None identified  Goals    . DIET - INCREASE WATER INTAKE     Recommend drinking at least 6-8 glasses of water a day. Increase water intake to 4-5 glasses if possible        Fall Risk: Fall Risk  06/22/2019 06/02/2019 12/18/2018 09/06/2017 01/11/2017  Falls in the past year? 0 0 0 No No  Number falls in past yr: 0 - 0 - -  Injury with Fall? 0 - 0 - -  Risk for fall due to : - - History of fall(s) - -  Follow up - - Falls evaluation completed - -    FALL RISK PREVENTION PERTAINING TO THE HOME:  Any  stairs in or around the home? Yes  steps going in home  If so, are there any without handrails? No   Home free of loose throw rugs in walkways, pet beds, electrical cords, etc? Yes  Adequate lighting in your home to reduce risk of falls? Yes   ASSISTIVE DEVICES UTILIZED TO PREVENT FALLS:  Life alert? No  Use of a cane, walker or w/c? No  Grab bars in the bathroom? No  Shower chair or bench in shower? Yes  if needed Elevated toilet seat or a handicapped toilet? No   TIMED UP AND GO:  Was the test performed? Yes .  Length of time to ambulate 10 feet: 8 sec.   GAIT:  Appearance of gait: Gait steady and fast without the use of an assistive device.  Education: Fall risk prevention has been discussed.  Intervention(s) required? No   DME/home health order needed?  No    Depression Screen PHQ 2/9 Scores 06/22/2019 09/06/2017 01/11/2017  PHQ - 2 Score 0 1 0  PHQ- 9 Score - - 0     Cognitive Function     6CIT Screen 06/22/2019 09/06/2017  What Year? 0 points 0 points  What month? 0 points 0 points  What time? 0 points 0 points  Count back from 20 0 points 0 points  Months in reverse 0 points 0 points  Repeat phrase 2 points 0 points  Total Score 2 0    Immunization History  Administered Date(s) Administered  . Fluad Quad(high Dose 65+) 06/22/2019  . Influenza Split 03/25/2015  . Influenza, High Dose Seasonal PF 05/31/2017, 04/11/2018  . Pneumococcal Conjugate-13 05/04/2017  . Pneumococcal Polysaccharide-23 04/09/2013    Qualifies for Shingles Vaccine? Yes  Zostavax completed n/a. Due for Shingrix. Education has been provided regarding the importance of this vaccine. Pt has been advised to call insurance company to determine out of pocket expense. Advised may also receive vaccine at local pharmacy or Health Dept. Verbalized acceptance and understanding.  Tdap: declined  Flu Vaccine: done today   Pneumococcal Vaccine:  Due for Pneumococcal vaccine. Does the patient want to  receive this vaccine today?  No . Education has been provided regarding the importance of this vaccine but still declined. Advised may receive this vaccine at local pharmacy or Health Dept. Aware to provide a copy of the vaccination record if obtained from local pharmacy or Health Dept. Verbalized acceptance and understanding.   Screening Tests Health Maintenance  Topic Date Due  . URINE MICROALBUMIN  07/16/2017  . FOOT EXAM  12/07/2018  . OPHTHALMOLOGY EXAM  01/15/2019  . HEMOGLOBIN A1C  06/20/2019  . MAMMOGRAM  09/18/2019 (Originally 08/26/1999)  . DEXA SCAN  09/18/2019 (Originally 08/26/2014)  . COLONOSCOPY  09/18/2019 (Originally 08/26/1999)  . Hepatitis C Screening  09/18/2019 (Originally [birth year])  . TETANUS/TDAP  12/18/2019 (Originally [birth year]/1968)  . PNA vac Low Risk Adult (2 of 2 - PPSV23) 12/18/2019 (Originally 05/04/2018)  . INFLUENZA VACCINE  Completed    Cancer Screenings:  Colorectal Screening: declined   Mammogram: declined  Bone Density: declined  Lung Cancer Screening: (Low Dose CT Chest recommended if Age 82-80 years, 30 pack-year currently smoking OR have quit w/in 15years.) does not qualify.    Additional Screening:  Hepatitis C Screening: does qualify; ordered  Dental Screening: Recommended annual dental exams for proper oral hygiene   Community Resource Referral:  CRR required this visit?  No       Plan:  I have personally reviewed and addressed the Medicare Annual Wellness questionnaire and have noted the following in the patient's chart:  A. Medical and social history B. Use of alcohol, tobacco or illicit drugs  C. Current medications and supplements D. Functional ability and status E.  Nutritional status F.  Physical activity G. Advance directives H. List of other physicians I.  Hospitalizations, surgeries, and ER visits in previous 12 months J.  [place] such as hearing and vision if needed, cognitive and depression L. Referrals  and appointments   In addition, I have reviewed and discussed with patient certain preventive protocols, quality metrics, and best practice recommendations. A written personalized care plan for preventive services as well as general preventive health recommendations were provided to patient.   Signed,    Bevelyn Ngo, LPN  7/98/9211 Nurse Health Advisor   Nurse Notes: none

## 2019-06-22 NOTE — Progress Notes (Signed)
BP 138/70   Pulse 80   Temp (!) 97.5 F (36.4 C) (Oral)   Ht 5\' 1"  (1.549 m)   Wt 193 lb 12.8 oz (87.9 kg)   SpO2 100%   BMI 36.62 kg/m    Subjective:    Patient ID: Angela Russell, female    DOB: 28-Feb-1951, 69 y.o.   MRN: [id number]  HPI: Angela Russell is a 69 y.o. female  Chief Complaint  Patient presents with  . Hyperlipidemia  . Hypertension   Patient presenting today for 6 month f/u chronic conditions. States she's doing well overall. No concerns with her medications. Denies any new issues or concerns.   HTN - Home BPs running 130s/70s. Taking medicines faithfully without side effects. Denies CP, SOB, HAs, dizziness.   DM - Diet controlled. Trying to eat healthy and be as active as tolerated. Denies low blood sugar spells, polyuria, polydipsia.   HLD - on crestor, no side effects. Denies myalgias, claudication.   Urothelial carcinoma - Followed by Oncology  DVT 03/2019 - s/p vascular thrombectomy and IVC filter placement. IVC filter removed 06/15/19 and now stable on eliquis without bleeding or bruising issues. Following with Vascular for this.   Relevant past medical, surgical, family and social history reviewed and updated as indicated. Interim medical history since our last visit reviewed. Allergies and medications reviewed and updated.  Review of Systems  Per HPI unless specifically indicated above     Objective:    BP 138/70   Pulse 80   Temp (!) 97.5 F (36.4 C) (Oral)   Ht 5\' 1"  (1.549 m)   Wt 193 lb 12.8 oz (87.9 kg)   SpO2 100%   BMI 36.62 kg/m   Wt Readings from Last 3 Encounters:  06/23/19 193 lb 6.4 oz (87.7 kg)  06/22/19 193 lb 12.8 oz (87.9 kg)  06/22/19 193 lb 12.8 oz (87.9 kg)    Physical Exam Vitals and nursing note reviewed.  Constitutional:      Appearance: Normal appearance. She is not ill-appearing.  HENT:     Head: Atraumatic.  Eyes:     Extraocular Movements: Extraocular movements intact.     Conjunctiva/sclera:  Conjunctivae normal.  Cardiovascular:     Rate and Rhythm: Normal rate and regular rhythm.     Heart sounds: Normal heart sounds.  Pulmonary:     Effort: Pulmonary effort is normal.     Breath sounds: Normal breath sounds.  Musculoskeletal:        General: Normal range of motion.     Cervical back: Normal range of motion and neck supple.  Skin:    General: Skin is warm and dry.  Neurological:     Mental Status: She is alert and oriented to person, place, and time.  Psychiatric:        Mood and Affect: Mood normal.        Thought Content: Thought content normal.        Judgment: Judgment normal.     Results for orders placed or performed in visit on 06/22/19  Hepatitis C antibody screen  Result Value Ref Range   Hep C Virus Ab <0.1 0.0 - 0.9 s/co ratio  Comprehensive metabolic panel  Result Value Ref Range   Glucose 70 65 - 99 mg/dL   BUN 23 8 - 27 mg/dL   Creatinine, Ser 1.47 (H) 0.57 - 1.00 mg/dL   GFR calc non Af Amer 36 (L) >59 mL/min/1.73   GFR calc Af  Amer 42 (L) >59 mL/min/1.73   BUN/Creatinine Ratio 16 12 - 28   Sodium 141 134 - 144 mmol/L   Potassium 4.5 3.5 - 5.2 mmol/L   Chloride 105 96 - 106 mmol/L   CO2 23 20 - 29 mmol/L   Calcium 9.7 8.7 - 10.3 mg/dL   Total Protein 7.4 6.0 - 8.5 g/dL   Albumin 4.3 3.8 - 4.8 g/dL   Globulin, Total 3.1 1.5 - 4.5 g/dL   Albumin/Globulin Ratio 1.4 1.2 - 2.2   Bilirubin Total 0.2 0.0 - 1.2 mg/dL   Alkaline Phosphatase 130 (H) 39 - 117 IU/L   AST 19 0 - 40 IU/L   ALT 7 0 - 32 IU/L  Lipid Panel w/o Chol/HDL Ratio  Result Value Ref Range   Cholesterol, Total 371 (H) 100 - 199 mg/dL   Triglycerides 85 0 - 149 mg/dL   HDL 73 >39 mg/dL   VLDL Cholesterol Cal 13 5 - 40 mg/dL   LDL Chol Calc (NIH) 285 (H) 0 - 99 mg/dL  Hemoglobin A1c  Result Value Ref Range   Hgb A1c MFr Bld 6.3 (H) 4.8 - 5.6 %   Est. average glucose Bld gHb Est-mCnc 134 mg/dL      Assessment & Plan:   Problem List Items Addressed This Visit       Cardiovascular and Mediastinum   Hypertension associated with diabetes ([place]) - Primary    BPs stable and under good control, continue current regimen      Relevant Orders   Comprehensive metabolic panel   Carotid artery plaque, right    Recheck lipids, continue lifestyle modifications        Endocrine   Controlled diabetes mellitus type 2 with complications ([facility])    Diet controlled, recheck A1C and adjust as needed      Relevant Orders   HgB A1c     Genitourinary   Metastatic urothelial carcinoma ([place])    Following closely with Oncology, continue per their recommendations        Other   Hyperlipidemia    Recheck lipids, adjust regimen as needed. Continue current regimen      Relevant Orders   Lipid Panel w/o Chol/HDL Ratio   History of DVT (deep vein thrombosis)    Following with Vascular Specialist, continue per their recommendations on eliquis          Follow up plan: Return in about 6 months (around 12/20/2019) for 6 month f/u.

## 2019-06-23 ENCOUNTER — Inpatient Hospital Stay: Payer: Medicare Other

## 2019-06-23 ENCOUNTER — Inpatient Hospital Stay: Payer: Medicare Other | Attending: Oncology

## 2019-06-23 ENCOUNTER — Inpatient Hospital Stay (HOSPITAL_BASED_OUTPATIENT_CLINIC_OR_DEPARTMENT_OTHER): Payer: Medicare Other | Admitting: Oncology

## 2019-06-23 ENCOUNTER — Other Ambulatory Visit: Payer: Self-pay | Admitting: Family Medicine

## 2019-06-23 ENCOUNTER — Encounter: Payer: Self-pay | Admitting: Oncology

## 2019-06-23 ENCOUNTER — Other Ambulatory Visit: Payer: Self-pay

## 2019-06-23 VITALS — BP 163/72 | HR 77 | Temp 96.4°F | Resp 18 | Wt 193.4 lb

## 2019-06-23 DIAGNOSIS — Z86718 Personal history of other venous thrombosis and embolism: Secondary | ICD-10-CM

## 2019-06-23 DIAGNOSIS — Z7901 Long term (current) use of anticoagulants: Secondary | ICD-10-CM

## 2019-06-23 DIAGNOSIS — I2510 Atherosclerotic heart disease of native coronary artery without angina pectoris: Secondary | ICD-10-CM | POA: Insufficient documentation

## 2019-06-23 DIAGNOSIS — E1165 Type 2 diabetes mellitus with hyperglycemia: Secondary | ICD-10-CM | POA: Diagnosis not present

## 2019-06-23 DIAGNOSIS — Z5112 Encounter for antineoplastic immunotherapy: Secondary | ICD-10-CM | POA: Diagnosis not present

## 2019-06-23 DIAGNOSIS — Z79899 Other long term (current) drug therapy: Secondary | ICD-10-CM | POA: Insufficient documentation

## 2019-06-23 DIAGNOSIS — C679 Malignant neoplasm of bladder, unspecified: Secondary | ICD-10-CM | POA: Diagnosis not present

## 2019-06-23 DIAGNOSIS — R918 Other nonspecific abnormal finding of lung field: Secondary | ICD-10-CM | POA: Insufficient documentation

## 2019-06-23 DIAGNOSIS — E785 Hyperlipidemia, unspecified: Secondary | ICD-10-CM | POA: Insufficient documentation

## 2019-06-23 DIAGNOSIS — Z87891 Personal history of nicotine dependence: Secondary | ICD-10-CM | POA: Insufficient documentation

## 2019-06-23 DIAGNOSIS — Z95828 Presence of other vascular implants and grafts: Secondary | ICD-10-CM

## 2019-06-23 DIAGNOSIS — C7910 Secondary malignant neoplasm of unspecified urinary organs: Secondary | ICD-10-CM | POA: Diagnosis not present

## 2019-06-23 DIAGNOSIS — N183 Chronic kidney disease, stage 3 unspecified: Secondary | ICD-10-CM | POA: Diagnosis not present

## 2019-06-23 DIAGNOSIS — D631 Anemia in chronic kidney disease: Secondary | ICD-10-CM | POA: Diagnosis not present

## 2019-06-23 DIAGNOSIS — I129 Hypertensive chronic kidney disease with stage 1 through stage 4 chronic kidney disease, or unspecified chronic kidney disease: Secondary | ICD-10-CM | POA: Diagnosis not present

## 2019-06-23 DIAGNOSIS — I700 Atherosclerosis of aorta: Secondary | ICD-10-CM | POA: Insufficient documentation

## 2019-06-23 DIAGNOSIS — M810 Age-related osteoporosis without current pathological fracture: Secondary | ICD-10-CM | POA: Diagnosis not present

## 2019-06-23 DIAGNOSIS — M7989 Other specified soft tissue disorders: Secondary | ICD-10-CM | POA: Insufficient documentation

## 2019-06-23 DIAGNOSIS — N1831 Chronic kidney disease, stage 3a: Secondary | ICD-10-CM | POA: Diagnosis not present

## 2019-06-23 LAB — COMPREHENSIVE METABOLIC PANEL
ALT: 7 IU/L (ref 0–32)
ALT: 10 U/L (ref 0–44)
AST: 19 IU/L (ref 0–40)
AST: 17 U/L (ref 15–41)
Albumin/Globulin Ratio: 1.4 (ref 1.2–2.2)
Albumin: 4.3 g/dL (ref 3.8–4.8)
Albumin: 4 g/dL (ref 3.5–5.0)
Alkaline Phosphatase: 130 IU/L — ABNORMAL HIGH (ref 39–117)
Alkaline Phosphatase: 98 U/L (ref 38–126)
Anion gap: 9 (ref 5–15)
BUN/Creatinine Ratio: 16 (ref 12–28)
BUN: 23 mg/dL (ref 8–27)
BUN: 30 mg/dL — ABNORMAL HIGH (ref 8–23)
Bilirubin Total: 0.2 mg/dL (ref 0.0–1.2)
CO2: 23 mmol/L (ref 20–29)
CO2: 23 mmol/L (ref 22–32)
Calcium: 9.7 mg/dL (ref 8.7–10.3)
Calcium: 9.2 mg/dL (ref 8.9–10.3)
Chloride: 105 mmol/L (ref 96–106)
Chloride: 108 mmol/L (ref 98–111)
Creatinine, Ser: 1.47 mg/dL — ABNORMAL HIGH (ref 0.57–1.00)
Creatinine, Ser: 1.42 mg/dL — ABNORMAL HIGH (ref 0.44–1.00)
GFR calc Af Amer: 42 mL/min/{1.73_m2} — ABNORMAL LOW (ref >59)
GFR calc Af Amer: 44 mL/min — ABNORMAL LOW (ref >60)
GFR calc non Af Amer: 36 mL/min/{1.73_m2} — ABNORMAL LOW (ref >59)
GFR calc non Af Amer: 38 mL/min — ABNORMAL LOW (ref >60)
Globulin, Total: 3.1 g/dL (ref 1.5–4.5)
Glucose, Bld: 127 mg/dL — ABNORMAL HIGH (ref 70–99)
Glucose: 70 mg/dL (ref 65–99)
Potassium: 4.5 mmol/L (ref 3.5–5.2)
Potassium: 4.2 mmol/L (ref 3.5–5.1)
Sodium: 141 mmol/L (ref 134–144)
Sodium: 140 mmol/L (ref 135–145)
Total Bilirubin: 0.4 mg/dL (ref 0.3–1.2)
Total Protein: 7.4 g/dL (ref 6.0–8.5)
Total Protein: 7.7 g/dL (ref 6.5–8.1)

## 2019-06-23 LAB — CBC WITH DIFFERENTIAL/PLATELET
Abs Immature Granulocytes: 0.02 10*3/uL (ref 0.00–0.07)
Basophils Absolute: 0 10*3/uL (ref 0.0–0.1)
Basophils Relative: 1 %
Eosinophils Absolute: 0.1 10*3/uL (ref 0.0–0.5)
Eosinophils Relative: 2 %
HCT: 31.7 % — ABNORMAL LOW (ref 36.0–46.0)
Hemoglobin: 9.6 g/dL — ABNORMAL LOW (ref 12.0–15.0)
Immature Granulocytes: 0 %
Lymphocytes Relative: 18 %
Lymphs Abs: 0.8 10*3/uL (ref 0.7–4.0)
MCH: 23.9 pg — ABNORMAL LOW (ref 26.0–34.0)
MCHC: 30.3 g/dL (ref 30.0–36.0)
MCV: 79.1 fL — ABNORMAL LOW (ref 80.0–100.0)
Monocytes Absolute: 0.4 10*3/uL (ref 0.1–1.0)
Monocytes Relative: 9 %
Neutro Abs: 3.3 10*3/uL (ref 1.7–7.7)
Neutrophils Relative %: 70 %
Platelets: 238 10*3/uL (ref 150–400)
RBC: 4.01 MIL/uL (ref 3.87–5.11)
RDW: 14.6 % (ref 11.5–15.5)
WBC: 4.8 10*3/uL (ref 4.0–10.5)
nRBC: 0 % (ref 0.0–0.2)

## 2019-06-23 LAB — LIPID PANEL W/O CHOL/HDL RATIO
Cholesterol, Total: 371 mg/dL — ABNORMAL HIGH (ref 100–199)
HDL: 73 mg/dL (ref >39)
LDL Chol Calc (NIH): 285 mg/dL — ABNORMAL HIGH (ref 0–99)
Triglycerides: 85 mg/dL (ref 0–149)
VLDL Cholesterol Cal: 13 mg/dL (ref 5–40)

## 2019-06-23 LAB — HEMOGLOBIN A1C
Est. average glucose Bld gHb Est-mCnc: 134 mg/dL
Hgb A1c MFr Bld: 6.3 % — ABNORMAL HIGH (ref 4.8–5.6)

## 2019-06-23 LAB — HEPATITIS C ANTIBODY: Hep C Virus Ab: <0.1 s/co ratio (ref 0.0–0.9)

## 2019-06-23 LAB — TSH: TSH: 3.227 u[IU]/mL (ref 0.350–4.500)

## 2019-06-23 MED ORDER — SODIUM CHLORIDE 0.9 % IV SOLN
Freq: Once | INTRAVENOUS | Status: AC
  Filled 2019-06-23: qty 250

## 2019-06-23 MED ORDER — SODIUM CHLORIDE 0.9 % IV SOLN
200.0000 mg | Freq: Once | INTRAVENOUS | Status: AC
  Administered 2019-06-23: 200 mg via INTRAVENOUS
  Filled 2019-06-23: qty 8

## 2019-06-23 MED ORDER — SODIUM CHLORIDE 0.9% FLUSH
10.0000 mL | Freq: Once | INTRAVENOUS | Status: AC
  Administered 2019-06-23: 10 mL via INTRAVENOUS
  Filled 2019-06-23: qty 10

## 2019-06-23 MED ORDER — HEPARIN SOD (PORK) LOCK FLUSH 100 UNIT/ML IV SOLN
500.0000 [IU] | Freq: Once | INTRAVENOUS | Status: AC | PRN
  Administered 2019-06-23: 500 [IU]
  Filled 2019-06-23: qty 5

## 2019-06-23 MED ORDER — HEPARIN SOD (PORK) LOCK FLUSH 100 UNIT/ML IV SOLN
INTRAVENOUS | Status: AC
  Filled 2019-06-23: qty 5

## 2019-06-23 MED ORDER — ROSUVASTATIN CALCIUM 20 MG PO TABS: 20.0000 mg | ORAL_TABLET | Freq: Every day | ORAL | 1 refills | Status: DC

## 2019-06-23 NOTE — Progress Notes (Signed)
Patient reports having slight bloody vaginal discharge since starting eliquis.

## 2019-06-23 NOTE — Progress Notes (Signed)
Hematology/Oncology follow up note [facility] Telephone:([phone] Fax:([phone]) [phone]   Patient Care Team: Volney American, PA-C as PCP - General (Family Medicine) Idelle Leech, OD (Optometry)  REFERRING PROVIDER: Dr.Sninsky CHIEF COMPLAINTS/REASON FOR VISIT:  Follow up for bladder cancer, anemia.   HISTORY OF PRESENTING ILLNESS:  Angela Russell is a  69 y.o.  female with PMH listed below who was referred to me for evaluation of newly diagnosed bladder cancer. Patient initially presented to emergency room at the end of January 2020 for evaluation of dysuria, hematuria and the left lower quadrant inguinal pain and flank pain.  1/28 2020 CT renal stone study showed suspected irregular wall thickening about the superior bladder, not well assessed due to degree of bladder distention.  Recommend cystoscopy for further evaluation.  No renal stone or obstructive uropathy.  Patient was given IV Rocephin and referred patient for outpatient urology follow-up.  Urine culture was negative.  She again presented to ER after 2 days with similar symptoms.  Patient has 25-pack-year smoking history, quit approximately 20 years ago.  No family history of any urology malignancies 07/03/2018 another CT abdomen pelvis with contrast was done which showed no nephrolithiasis or hydronephrosis is identified.  Bladder is decompressed limiting evaluation.  07/16/2018.urology Dr. [name] - cystoscopy and bilateral retrograde pyelogram on 07/16/2018.  Pyelogram did not show any filling defect or abnormalities.  No hydronephrosis.  Ureteral orifice was not involved with tumor.  There is a large 5 cm posterior wall bladder tumor, bullous and sessile appearing.  Patient underwent TURBT.   Pathology: High-grade urothelial carcinoma, invasive into muscularis propria.  Lymphovascular invasion is present.  Carcinoma in situ is also identified.  Focal squamous differentiation is noted, areas of  invasive carcinoma display pleomorphic/sarcomatoid changes.  T2b  08/07/2018 CT without contrast negative.  2 subpleural right upper lobe nodule 2 to 3 mm likely benign. She also had baseline audiometry done.  08/04/2018 ddMVAC x 1 cycle, stopped due to intolerance and AKI.  Patient received 1 cycle of dd MVAC, not able to tolerate due to AKI. Patient then was referred to [facility]rology  09/22/2018 patient underwent a cystectomy, pathology pT3a N0 Mx.  11 lymph nodes were harvested and was all negative. Invasive urothelia carcinoma, high grade, with sarcomatoid features.   10/14/2018 patient was admitted due to pyelonephritis and a pelvic fluid collection.  Drain was placed. 10/23/2018 drain was removed. Patient has had difficulties getting to her appointments to [facility]. 02/09/2019, patient presented with abdominal pain. CT concerning for small bowel obstruction and increased size of right pelvic fluid collection concerning for cancer recurrence.  Right hydronephrosis to the level of pelvis and enlarged lymph nodes. Patient had JP drain placed with CT guidance to pelvic fluid collection and drained 400 cc amber fluid.  Culture was negative for growth of microorganisms and cytology was negative for malignancy.-JP drain was removed on the day of discharge. CT-guided core biopsy of pelvic lymph node adenopathy was attempted but not successful.  # Right lower extremity DVT, provoked by Transvenous biopsy  02/26/2019 transvenous biopsy by IR unsuccessful transvenous biopsy of the right pelvis mass. Post biopsy acute thrombus in the right external iliac and common femoral vein.  Patient was recommended to start anticoagulation with Xarelto however due to the co-pay, patient is not able to afford the medication. Anticoagulation regimen was switched to Eliquis 5 mg.  Patient reports that she has been taking it once a day. 03/20/2019 PET showed FDG avid  tissue in the cystectomy bed, retroperitoneal and  pelvic lymphadenopathy.  Right common iliac DVT.  # increased right lower extremity swelling.  She was started on Eliquis for anticoagulation by [facility].  We clarified with her pharmacy and she was actually taking Eliquis 2.5 mg twice daily. Right lower extremity swelling has not improved but instead worsened. 03/16/2019 She had ultrasound right lower extremity done which showed persistent extensive proximal right lower extremity DVT.  Anticoagulation regimen has increased to Eliquis 5 mg twice daily.  # establish care with [facility] oncology Dr. [name] for evaluation.  Dr. [name] recommended starting immunotherapy with PD-L1 inhibitor Pembrolizumab 200 mg every 3 weeks.  The sarcomatoid histology may not respond to chemotherapy well, could portend a better chance of response into immunotherapy # Dr. [name] recommended PET scan in 14 weeks.  # 03/30/2019 Status post IVC filter placement, mechanical thrombectomy. # PD-L1 CPS 100%.  # 03/31/2019 started on immunotherapy Keytruda.   INTERVAL HISTORY Angela Russell is a 69 y.o. female who has above history reviewed by me today presents for follow up visit for evaluation form metastatic high-grade urothelial carcinoma of the bladder. Sarcomatoid feature.  Patient has been doing well on immunotherapy with Keytruda.  Patient reports doing well today. Appetite is "too good".  Denies any rash, diarrhea, fatigue, fever or chills.  Patient takes Eliquis 5 mg twice daily.  Status post IVC filter placement and mechanical thrombectomy. He mentioned intermittent small amount of bloody vaginal discharge.  She Has a history of hysterectomy.  Otherwise no new complaints.   Review of Systems  Constitutional: Negative for appetite change, chills, fatigue and fever.  HENT:   Negative for hearing loss and voice change.   Eyes: Negative for eye problems.  Respiratory: Negative for chest tightness and cough.   Cardiovascular: Negative for chest pain and leg  swelling.  Gastrointestinal: Negative for abdominal distention, abdominal pain and blood in stool.  Endocrine: Negative for hot flashes.  Genitourinary: Negative for difficulty urinating and frequency.   Musculoskeletal: Negative for arthralgias.  Skin: Negative for itching and rash.  Neurological: Negative for extremity weakness.  Hematological: Negative for adenopathy.  Psychiatric/Behavioral: Negative for confusion. The patient is not nervous/anxious.     MEDICAL HISTORY:  Past Medical History:  Diagnosis Date  . Carotid artery plaque, right 01/2014  . CKD (chronic kidney disease)    stage 2-3  . Diabetes mellitus without complication ([place])   . DM (diabetes mellitus), type 2, uncontrolled ([place])   . Hyperlipidemia   . Hypertension   . Hypochromic microcytic anemia    mild  . Metastatic urothelial carcinoma ([place]) 03/23/2019  . Osteoporosis   . Renal insufficiency   . Rotator cuff tendonitis, right     SURGICAL HISTORY: Past Surgical History:  Procedure Laterality Date  . ABDOMINAL HYSTERECTOMY  2000   due to bleeding and fibroids, partial- still has ovaries  . CYSTOSCOPY W/ RETROGRADES Bilateral 07/16/2018   Procedure: CYSTOSCOPY WITH RETROGRADE PYELOGRAM;  Surgeon: Billey Co, MD;  Location: [facility] ORS;  Service: Urology;  Laterality: Bilateral;  . IVC FILTER INSERTION N/A 03/30/2019   Procedure: IVC FILTER INSERTION;  Surgeon: Algernon Huxley, MD;  Location: [place] [facility];  Service: Cardiovascular;  Laterality: N/A;  . IVC FILTER REMOVAL N/A 06/15/2019   Procedure: IVC FILTER REMOVAL;  Surgeon: Algernon Huxley, MD;  Location: [place] [facility];  Service: Cardiovascular;  Laterality: N/A;  . KNEE SURGERY Left 03/17/2013   torn meniscus  . PERIPHERAL  VASCULAR THROMBECTOMY Right 03/30/2019   Procedure: PERIPHERAL VASCULAR THROMBECTOMY;  Surgeon: Algernon Huxley, MD;  Location: [place] [facility];  Service: Cardiovascular;  Laterality: Right;  . PORTA CATH INSERTION  N/A 08/06/2018   Procedure: PORTA CATH INSERTION;  Surgeon: Algernon Huxley, MD;  Location: [place] [facility];  Service: Cardiovascular;  Laterality: N/A;  . TRANSURETHRAL RESECTION OF BLADDER TUMOR N/A 07/16/2018   Procedure: TRANSURETHRAL RESECTION OF BLADDER TUMOR (TURBT);  Surgeon: Billey Co, MD;  Location: [facility] ORS;  Service: Urology;  Laterality: N/A;    SOCIAL HISTORY: Social History   Socioeconomic History  . Marital status: Single    Spouse name: Not on file  . Number of children: 1  . Years of education: Not on file  . Highest education level: 10th grade  Occupational History  . Not on file  Tobacco Use  . Smoking status: Former Smoker    Quit date: 06/05/1991    Years since quitting: 28.0  . Smokeless tobacco: Never Used  Substance and Sexual Activity  . Alcohol use: No  . Drug use: No  . Sexual activity: Never  Other Topics Concern  . Not on file  Social History Narrative   Working full time   Social Determinants of Radio broadcast assistant Strain:   . Difficulty of Paying Living Expenses: Not on file  Food Insecurity:   . Worried About Charity fundraiser in the Last Year: Not on file  . Ran Out of Food in the Last Year: Not on file  Transportation Needs:   . Lack of Transportation (Medical): Not on file  . Lack of Transportation (Non-Medical): Not on file  Physical Activity:   . Days of Exercise per Week: Not on file  . Minutes of Exercise per Session: Not on file  Stress:   . Feeling of Stress : Not on file  Social Connections:   . Frequency of Communication with Friends and Family: Not on file  . Frequency of Social Gatherings with Friends and Family: Not on file  . Attends Religious Services: Not on file  . Active Member of Clubs or Organizations: Not on file  . Attends Archivist Meetings: Not on file  . Marital Status: Not on file  Intimate Partner Violence:   . Fear of Current or Ex-Partner: Not on file  . Emotionally Abused:  Not on file  . Physically Abused: Not on file  . Sexually Abused: Not on file    FAMILY HISTORY: Family History  Problem Relation Age of Onset  . Heart disease Mother   . Heart attack Mother   . Arthritis Father   . Diabetes Brother     ALLERGIES:  has No Known Allergies.  MEDICATIONS:  Current Outpatient Medications  Medication Sig Dispense Refill  . amLODipine (NORVASC) 5 MG tablet Take 1 tablet (5 mg total) by mouth daily. 90 tablet 1  . apixaban (ELIQUIS) 5 MG TABS tablet Take 1 tablet (5 mg total) by mouth 2 (two) times daily. 60 tablet 3  . diclofenac sodium (VOLTAREN) 1 % GEL Apply 2 g topically 4 (four) times daily. 100 g 2  . lidocaine-prilocaine (EMLA) cream Apply to affected area once 30 g 3  . polyethylene glycol (MIRALAX / GLYCOLAX) 17 g packet Take 17 g by mouth daily as needed for mild constipation. 14 each 0  . rosuvastatin (CRESTOR) 20 MG tablet Take 1 tablet (20 mg total) by mouth daily. 90 tablet 1  No current facility-administered medications for this visit.     PHYSICAL EXAMINATION: ECOG PERFORMANCE STATUS: 0 - Asymptomatic Vitals:   06/23/19 1015  BP: (!) 163/72  Pulse: 77  Resp: 18  Temp: (!) 96.4 F (35.8 C)  SpO2: 100%   Filed Weights   06/23/19 1015  Weight: 193 lb 6.4 oz (87.7 kg)    Physical Exam Constitutional:      General: She is not in acute distress.    Comments: Walk independently  HENT:     Head: Normocephalic and atraumatic.  Eyes:     General: No scleral icterus.    Pupils: Pupils are equal, round, and reactive to light.  Cardiovascular:     Rate and Rhythm: Normal rate and regular rhythm.     Heart sounds: Normal heart sounds.  Pulmonary:     Effort: Pulmonary effort is normal. No respiratory distress.     Breath sounds: No wheezing.  Abdominal:     General: Bowel sounds are normal. There is no distension.     Palpations: Abdomen is soft. There is no mass.     Tenderness: There is no abdominal tenderness.    Musculoskeletal:        General: No deformity. Normal range of motion.     Cervical back: Normal range of motion and neck supple.     Comments: Trace right lower extremity edema  Skin:    General: Skin is warm and dry.     Coloration: Skin is not pale.     Findings: No erythema or rash.  Neurological:     Mental Status: She is alert and oriented to person, place, and time.     Cranial Nerves: No cranial nerve deficit.     Coordination: Coordination normal.  Psychiatric:        Behavior: Behavior normal.        Thought Content: Thought content normal.     RADIOGRAPHIC STUDIES: I have personally reviewed the radiological images as listed and agreed with the findings in the report.  CMP Latest Ref Rng & Units 06/23/2019  Glucose 70 - 99 mg/dL 127(H)  BUN 8 - 23 mg/dL 30(H)  Creatinine 0.44 - 1.00 mg/dL 1.42(H)  Sodium 135 - 145 mmol/L 140  Potassium 3.5 - 5.1 mmol/L 4.2  Chloride 98 - 111 mmol/L 108  CO2 22 - 32 mmol/L 23  Calcium 8.9 - 10.3 mg/dL 9.2  Total Protein 6.5 - 8.1 g/dL 7.7  Total Bilirubin 0.3 - 1.2 mg/dL 0.4  Alkaline Phos 38 - 126 U/L 98  AST 15 - 41 U/L 17  ALT 0 - 44 U/L 10   CBC Latest Ref Rng & Units 06/23/2019  WBC 4.0 - 10.5 K/uL 4.8  Hemoglobin 12.0 - 15.0 g/dL 9.6(L)  Hematocrit 36.0 - 46.0 % 31.7(L)  Platelets 150 - 400 K/uL 238    LABORATORY DATA:  I have reviewed the data as listed Lab Results  Component Value Date   WBC 4.8 06/23/2019   HGB 9.6 (L) 06/23/2019   HCT 31.7 (L) 06/23/2019   MCV 79.1 (L) 06/23/2019   PLT 238 06/23/2019   Recent Labs    12/19/18 1841 12/20/18 0004 06/02/19 0850 06/22/19 1701 06/23/19 0920  NA 137   < > 141 141 140  K 6.3*   < > 4.0 4.5 4.2  CL 115*   < > 109 105 108  CO2 15*   < > 21* 23 23  GLUCOSE 129*   < >  194* 70 127*  BUN 37*   < > 30* 23 30*  CREATININE 2.03*   < > 1.53* 1.47* 1.42*  CALCIUM 9.7   < > 8.9 9.7 9.2  GFRNONAA 24*   < > 34* 36* 38*  GFRAA 28*   < > 40* 42* 44*  PROT  --    < >  7.4 7.4 7.7  ALBUMIN  --    < > 3.7 4.3 4.0  AST  --    < > 21 19 17   ALT  --    < > 11 7 10   ALKPHOS  --    < > 112 130* 98  BILITOT 0.5   < > 0.4 0.2 0.4  BILIDIR <0.1  --   --   --   --   IBILI NOT CALCULATED  --   --   --   --    < > = values in this interval not displayed.   Iron/TIBC/Ferritin/ %Sat    Component Value Date/Time   IRON 46 03/04/2019 1423   IRON 26 (L) 02/04/2019 1309   TIBC 267 03/04/2019 1423   TIBC 251 02/04/2019 1309   FERRITIN 316 (H) 03/04/2019 1423   FERRITIN 409 (H) 02/04/2019 1309   IRONPCTSAT 17 03/04/2019 1423   IRONPCTSAT 10 (L) 02/04/2019 1309    RADIOGRAPHIC STUDIES: I have personally reviewed the radiological images as listed and agreed with the findings in the report. PERIPHERAL VASCULAR CATHETERIZATION  Result Date: 06/15/2019 See op note  PERIPHERAL VASCULAR CATHETERIZATION  Result Date: 03/30/2019 See op note  NM PET Image Restag (PS) Skull Base To Thigh  Result Date: 06/18/2019 CLINICAL DATA:  Subsequent treatment strategy for metastatic urothelial carcinoma. EXAM: NUCLEAR MEDICINE PET SKULL BASE TO THIGH TECHNIQUE: 10.3 mCi F-18 FDG was injected intravenously. Full-ring PET imaging was performed from the skull base to thigh after the radiotracer. CT data was obtained and used for attenuation correction and anatomic localization. Fasting blood glucose: 127 mg/dl COMPARISON:  Multiple exams, including CT examinations from 10/13/2018 and 08/07/2018 FINDINGS: Mediastinal blood pool activity: SUV max 2.8 Liver activity: SUV max NA NECK: Incidental physiologic muscular activity. Incidental CT findings: Atherosclerotic calcification of the left common carotid artery. CHEST: The tiny right upper lobe nodules seen on the CT from 08/07/2018 are not changed compared to the prior exam. These lesions are not hypermetabolic but are below sensitive PET-CT size thresholds, and measure in the 2-3 mm diameter range. Incidental CT findings: Right Port-A-Cath  tip: Right atrium. Right subcoracoid bursitis. Aberrant right subclavian artery passes behind the esophagus. Coronary, aortic arch, and branch vessel atherosclerotic vascular disease. ABDOMEN/PELVIS: A hypermetabolic lymph node in the upper pelvic mesentery slightly eccentric to the right measures 1.1 cm in short axis on image 184/3, and has a maximum SUV of 9.6, highly suspicious for malignancy. Excreted FDG noted in the ileal loop. Incidental CT findings: Peristomal herniation of small bowel loop without strangulation or obstruction. Prominent stool throughout the colon favors constipation. Aortoiliac atherosclerotic vascular disease. No current hydronephrosis. Right iliac vein stent. Persistent likely loculated fluid collections in the pelvis and along the left pelvic sidewall. SKELETON: No significant abnormal hypermetabolic activity in this region. Incidental CT findings: Degenerative arthropathy of both hips. IMPRESSION: 1. A single mildly enlarged central mesenteric lymph node in the upper pelvis has a maximum SUV of 9.6, suspicious for recurrent malignancy. 2. Peristomal herniation of a small bowel loop without strangulation or obstruction. 3. Other imaging findings of potential clinical significance:  Aortic Atherosclerosis (ICD10-I70.0). Coronary atherosclerosis. Aberrant right subclavian artery. Right subcoracoid bursitis. Prominent stool throughout the colon favors constipation. Persistent loculated fluid collections in the pelvis and along the left pelvic sidewall (query postoperative seromas). Electronically Signed   By: Van Clines M.D.   On: 06/18/2019 12:57   DVT (Right)  Result Date: 06/09/2019  Lower Venous Study Other Indications: Chronic DVT right CFV. Risk Factors: Surgery Rt CFV thrombectomy 03/30/2019. Comparison Study: 03/27/2019 Performing Technologist: Concha Norway RVT  Examination Guidelines: A complete evaluation includes B-mode imaging, spectral Doppler, color Doppler, and  power Doppler as needed of all accessible portions of each vessel. Bilateral testing is considered an integral part of a complete examination. Limited examinations for reoccurring indications may be performed as noted.  +---------+---------------+---------+-----------+----------+--------------+ RIGHT    CompressibilityPhasicitySpontaneityPropertiesThrombus Aging +---------+---------------+---------+-----------+----------+--------------+ CFV      Full           Yes      Yes                                 +---------+---------------+---------+-----------+----------+--------------+ SFJ      Full           Yes      Yes                                 +---------+---------------+---------+-----------+----------+--------------+ FV Prox  Full                                                        +---------+---------------+---------+-----------+----------+--------------+ FV Mid   Full           Yes      Yes                                 +---------+---------------+---------+-----------+----------+--------------+ FV DistalFull                                                        +---------+---------------+---------+-----------+----------+--------------+ PFV      Full           Yes      Yes                                 +---------+---------------+---------+-----------+----------+--------------+ POP      Full           Yes      Yes                                 +---------+---------------+---------+-----------+----------+--------------+ PTV      Full           Yes      Yes                                 +---------+---------------+---------+-----------+----------+--------------+ PERO  Full           Yes      Yes                                 +---------+---------------+---------+-----------+----------+--------------+ Gastroc  Full           Yes      Yes                                  +---------+---------------+---------+-----------+----------+--------------+ GSV      Full           Yes      Yes                                 +---------+---------------+---------+-----------+----------+--------------+ SSV      Full           Yes                                          +---------+---------------+---------+-----------+----------+--------------+   +----+---------------+---------+-----------+----------+--------------+ LEFTCompressibilityPhasicitySpontaneityPropertiesThrombus Aging +----+---------------+---------+-----------+----------+--------------+ CFV Full           Yes      Yes                                 +----+---------------+---------+-----------+----------+--------------+ SFJ Full           Yes      Yes                                 +----+---------------+---------+-----------+----------+--------------+     Summary: Right: There is no evidence of deep vein thrombosis in the lower extremity.There is no evidence of superficial venous thrombosis. Left: No evidence of common femoral vein obstruction.  *See table(s) above for measurements and observations. Electronically signed by Leotis Pain MD on 06/09/2019 at 9:11:11 AM.    Final    VAS [place] LOWER EXTREMITY VENOUS (DVT)  Result Date: 03/27/2019  Lower Venous Study Risk Factors: DVT right. Comparison Study: 03/16/2019 Performing Technologist: Concha Norway RVT  Examination Guidelines: A complete evaluation includes B-mode imaging, spectral Doppler, color Doppler, and power Doppler as needed of all accessible portions of each vessel. Bilateral testing is considered an integral part of a complete examination. Limited examinations for reoccurring indications may be performed as noted.  +---------+---------------+---------+-----------+-----------------+------------+ RIGHT    CompressibilityPhasicitySpontaneityProperties       Thrombus                                                                   Aging        +---------+---------------+---------+-----------+-----------------+------------+ CFV      Partial        Yes      Yes        partially        Chronic  re-cannalized                 +---------+---------------+---------+-----------+-----------------+------------+ SFJ      Full           Yes      Yes                                      +---------+---------------+---------+-----------+-----------------+------------+ FV Prox  Full                                                             +---------+---------------+---------+-----------+-----------------+------------+ FV Mid   Full           Yes      Yes                                      +---------+---------------+---------+-----------+-----------------+------------+ FV DistalFull                                                             +---------+---------------+---------+-----------+-----------------+------------+ PFV      Full           Yes      Yes                                      +---------+---------------+---------+-----------+-----------------+------------+ POP      Full           Yes      Yes                                      +---------+---------------+---------+-----------+-----------------+------------+ PTV      Full           Yes      Yes                                      +---------+---------------+---------+-----------+-----------------+------------+ PERO     Full           Yes      Yes                                      +---------+---------------+---------+-----------+-----------------+------------+ Gastroc  Full                                                             +---------+---------------+---------+-----------+-----------------+------------+ GSV      Full           Yes  Yes                                       +---------+---------------+---------+-----------+-----------------+------------+ SSV      Full                                                             +---------+---------------+---------+-----------+-----------------+------------+   +-------+---------------+---------+-----------+----------+--------------+ LEFT   CompressibilityPhasicitySpontaneityPropertiesThrombus Aging +-------+---------------+---------+-----------+----------+--------------+ CFV    Full           Yes                                          +-------+---------------+---------+-----------+----------+--------------+ SFJ    Full           Yes                                          +-------+---------------+---------+-----------+----------+--------------+ FV ProxFull           Yes                                          +-------+---------------+---------+-----------+----------+--------------+     Summary: Right: Continued minimal chronic appearing thrombus on the posterior wall of the CFV only. Recannalized flow seen. No DVT found in remainder of lower extremity venous system Left: No evidence of common femoral vein obstruction.  *See table(s) above for measurements and observations. Electronically signed by Leotis Pain MD on 03/27/2019 at 1:17:26 PM.    Final    Patient had PET scan done 03/20/2019 which showed enhancing soft nodule in the cystectomy bed is worrisome for local recurrence. Right greater than left pelvic retroperitoneal and mesenteric lymphadenopathy consistent with nodal metastatic disease Deep vein thrombosis is seen in the right common iliac vein.  The upstream right external iliac vein is severely narrowed by right pelvic sidewall lymphadenopathy and postsurgical changes.  The right lower extremity appears enlarged and erythematous, likely due to venous insufficiency.  Loculated pelvic fluid collection appears smaller compared to February 09, 2019 scan.  ASSESSMENT & PLAN:  1.  Metastatic urothelial carcinoma ([place])   2. Anemia in stage 3a chronic kidney disease   3. History of DVT of lower extremity   4. Encounter for antineoplastic immunotherapy   5. Chronic anticoagulation   Cancer Staging Bladder carcinoma [facility]) Staging form: Urinary Bladder, AJCC 8th Edition - Clinical stage from 08/01/2018: Stage IVA (cTX, cN3, cM1a) - Signed by [name], MD on 04/21/2019  #Metastatic urothelial carcinoma of bladder, sarcomatoid features pelvic sidewall mass showed metastatic carcinoma consistent with involvement by urothelial carcinoma. Interval PET scan was independent reviewed by me and discussed with patient. Patient has single mildly enlarged central mesenteric lymph node in the upper pelvis which has a maximal SUV of 9.6. Previous PET scan was done at [facility]. At that time, patient has had hypermetabolic enlarged retroperitoneal, mesenteric, right greater than left pelvic lymphadenopathy Although there is no direct comparison between  the 2 PET scan, patient appears to have a good response with only mesenteric lymph node hypermetabolic activity at current PET scan. I will continue with current regimen. Labs reviewed and discussed with patient. Proceed with Keytruda today.   #Right lower extremity provoked femoral vein DVT secondary to transvenous biopsy in the context of cancer recurrence Continue Eliquis 5 mg twice daily daily. Patient has IVC filter which need to be removed in the future.  Suggest patient to follow-up with vascular surgery Her plan to repeat right lower extremity ultrasound around March 2021. Maintenance therapy depending on ultrasound results and her cancer status.  #Bloody vaginal discharge, recommend patient to see her gynecologist and have a pelvic examination. #Anemia in CKD.Marland Kitchen  Hemoglobin 9.6.  Stable.  Continue to monitor.   Erythropoietin therapy is contraindicated due to active cancer.   All questions were answered. The patient knows to call  the clinic with any problems questions or concerns.  Return of visit: 3 weeks.    [name], MD, PhD Hematology Oncology [facility] Pager- [phone] 06/23/19

## 2019-06-26 DIAGNOSIS — Z86718 Personal history of other venous thrombosis and embolism: Secondary | ICD-10-CM | POA: Insufficient documentation

## 2019-06-26 NOTE — Assessment & Plan Note (Signed)
Recheck lipids, adjust regimen as needed. Continue current regimen

## 2019-06-26 NOTE — Assessment & Plan Note (Signed)
Following with Vascular Specialist, continue per their recommendations on eliquis

## 2019-06-26 NOTE — Assessment & Plan Note (Signed)
BPs stable and under good control, continue current regimen 

## 2019-06-26 NOTE — Assessment & Plan Note (Signed)
Following closely with Oncology, continue per their recommendations

## 2019-06-26 NOTE — Assessment & Plan Note (Signed)
Recheck lipids, continue lifestyle modifications

## 2019-06-26 NOTE — Assessment & Plan Note (Signed)
Diet controlled, recheck A1C and adjust as needed

## 2019-07-14 ENCOUNTER — Encounter: Payer: Self-pay | Admitting: Oncology

## 2019-07-14 ENCOUNTER — Inpatient Hospital Stay (HOSPITAL_BASED_OUTPATIENT_CLINIC_OR_DEPARTMENT_OTHER): Payer: Medicare Other | Admitting: Oncology

## 2019-07-14 ENCOUNTER — Other Ambulatory Visit: Payer: Self-pay

## 2019-07-14 ENCOUNTER — Inpatient Hospital Stay: Payer: Medicare Other

## 2019-07-14 ENCOUNTER — Inpatient Hospital Stay: Payer: Medicare Other | Attending: Oncology

## 2019-07-14 VITALS — BP 158/79 | HR 83 | Temp 96.7°F | Resp 16 | Wt 195.4 lb

## 2019-07-14 DIAGNOSIS — Z79899 Other long term (current) drug therapy: Secondary | ICD-10-CM | POA: Diagnosis not present

## 2019-07-14 DIAGNOSIS — C679 Malignant neoplasm of bladder, unspecified: Secondary | ICD-10-CM | POA: Diagnosis not present

## 2019-07-14 DIAGNOSIS — N1831 Chronic kidney disease, stage 3a: Secondary | ICD-10-CM

## 2019-07-14 DIAGNOSIS — N183 Chronic kidney disease, stage 3 unspecified: Secondary | ICD-10-CM | POA: Diagnosis not present

## 2019-07-14 DIAGNOSIS — Z5112 Encounter for antineoplastic immunotherapy: Secondary | ICD-10-CM | POA: Diagnosis not present

## 2019-07-14 DIAGNOSIS — I129 Hypertensive chronic kidney disease with stage 1 through stage 4 chronic kidney disease, or unspecified chronic kidney disease: Secondary | ICD-10-CM | POA: Diagnosis not present

## 2019-07-14 DIAGNOSIS — Z7901 Long term (current) use of anticoagulants: Secondary | ICD-10-CM | POA: Diagnosis not present

## 2019-07-14 DIAGNOSIS — D631 Anemia in chronic kidney disease: Secondary | ICD-10-CM | POA: Diagnosis not present

## 2019-07-14 DIAGNOSIS — M7989 Other specified soft tissue disorders: Secondary | ICD-10-CM | POA: Diagnosis not present

## 2019-07-14 DIAGNOSIS — D509 Iron deficiency anemia, unspecified: Secondary | ICD-10-CM | POA: Insufficient documentation

## 2019-07-14 DIAGNOSIS — E1165 Type 2 diabetes mellitus with hyperglycemia: Secondary | ICD-10-CM | POA: Insufficient documentation

## 2019-07-14 DIAGNOSIS — Z87891 Personal history of nicotine dependence: Secondary | ICD-10-CM | POA: Diagnosis not present

## 2019-07-14 DIAGNOSIS — M810 Age-related osteoporosis without current pathological fracture: Secondary | ICD-10-CM | POA: Insufficient documentation

## 2019-07-14 DIAGNOSIS — C7910 Secondary malignant neoplasm of unspecified urinary organs: Secondary | ICD-10-CM

## 2019-07-14 DIAGNOSIS — E785 Hyperlipidemia, unspecified: Secondary | ICD-10-CM | POA: Diagnosis not present

## 2019-07-14 DIAGNOSIS — Z86718 Personal history of other venous thrombosis and embolism: Secondary | ICD-10-CM

## 2019-07-14 LAB — COMPREHENSIVE METABOLIC PANEL
ALT: 12 U/L (ref 0–44)
AST: 19 U/L (ref 15–41)
Albumin: 4 g/dL (ref 3.5–5.0)
Alkaline Phosphatase: 103 U/L (ref 38–126)
Anion gap: 9 (ref 5–15)
BUN: 37 mg/dL — ABNORMAL HIGH (ref 8–23)
CO2: 21 mmol/L — ABNORMAL LOW (ref 22–32)
Calcium: 9.2 mg/dL (ref 8.9–10.3)
Chloride: 110 mmol/L (ref 98–111)
Creatinine, Ser: 1.56 mg/dL — ABNORMAL HIGH (ref 0.44–1.00)
GFR calc Af Amer: 39 mL/min — ABNORMAL LOW (ref >60)
GFR calc non Af Amer: 34 mL/min — ABNORMAL LOW (ref >60)
Glucose, Bld: 134 mg/dL — ABNORMAL HIGH (ref 70–99)
Potassium: 4.1 mmol/L (ref 3.5–5.1)
Sodium: 140 mmol/L (ref 135–145)
Total Bilirubin: 0.5 mg/dL (ref 0.3–1.2)
Total Protein: 7.8 g/dL (ref 6.5–8.1)

## 2019-07-14 LAB — CBC WITH DIFFERENTIAL/PLATELET
Abs Immature Granulocytes: 0.01 10*3/uL (ref 0.00–0.07)
Basophils Absolute: 0 10*3/uL (ref 0.0–0.1)
Basophils Relative: 1 %
Eosinophils Absolute: 0.1 10*3/uL (ref 0.0–0.5)
Eosinophils Relative: 2 %
HCT: 32.7 % — ABNORMAL LOW (ref 36.0–46.0)
Hemoglobin: 9.8 g/dL — ABNORMAL LOW (ref 12.0–15.0)
Immature Granulocytes: 0 %
Lymphocytes Relative: 20 %
Lymphs Abs: 1 10*3/uL (ref 0.7–4.0)
MCH: 23.9 pg — ABNORMAL LOW (ref 26.0–34.0)
MCHC: 30 g/dL (ref 30.0–36.0)
MCV: 79.8 fL — ABNORMAL LOW (ref 80.0–100.0)
Monocytes Absolute: 0.4 10*3/uL (ref 0.1–1.0)
Monocytes Relative: 9 %
Neutro Abs: 3.3 10*3/uL (ref 1.7–7.7)
Neutrophils Relative %: 68 %
Platelets: 227 10*3/uL (ref 150–400)
RBC: 4.1 MIL/uL (ref 3.87–5.11)
RDW: 14.2 % (ref 11.5–15.5)
WBC: 4.9 10*3/uL (ref 4.0–10.5)
nRBC: 0 % (ref 0.0–0.2)

## 2019-07-14 MED ORDER — HEPARIN SOD (PORK) LOCK FLUSH 100 UNIT/ML IV SOLN
500.0000 [IU] | Freq: Once | INTRAVENOUS | Status: AC | PRN
  Administered 2019-07-14: 12:00:00 500 [IU]
  Filled 2019-07-14: qty 5

## 2019-07-14 MED ORDER — SODIUM CHLORIDE 0.9 % IV SOLN
200.0000 mg | Freq: Once | INTRAVENOUS | Status: AC
  Administered 2019-07-14: 11:00:00 200 mg via INTRAVENOUS
  Filled 2019-07-14: qty 8

## 2019-07-14 MED ORDER — HEPARIN SOD (PORK) LOCK FLUSH 100 UNIT/ML IV SOLN
INTRAVENOUS | Status: AC
  Filled 2019-07-14: qty 5

## 2019-07-14 MED ORDER — SODIUM CHLORIDE 0.9 % IV SOLN
Freq: Once | INTRAVENOUS | Status: AC
  Filled 2019-07-14: qty 250

## 2019-07-14 NOTE — Progress Notes (Signed)
Patient is concerned with continued weight gain.

## 2019-07-14 NOTE — Progress Notes (Signed)
Hematology/Oncology follow up note [facility] [phone] [phone]) [phone]   Patient Care Team: Volney American, PA-C as PCP - General (Family Medicine) Idelle Leech, [place] (Optometry) [name], MD as Consulting Physician (Hematology and Oncology)  REFERRING PROVIDER: Dr.Sninsky CHIEF COMPLAINTS/REASON FOR VISIT:  Follow up for bladder cancer, anemia.   HISTORY OF PRESENTING ILLNESS:  Angela Russell is a  69 y.o.  female with PMH listed below who was referred to me for evaluation of newly diagnosed bladder cancer. Patient initially presented to emergency room at the end of January 2020 for evaluation of dysuria, hematuria and the left lower quadrant inguinal pain and flank pain.  1/28 2020 CT renal stone study showed suspected irregular wall thickening about the superior bladder, not well assessed due to degree of bladder distention.  Recommend cystoscopy for further evaluation.  No renal stone or obstructive uropathy.  Patient was given IV Rocephin and referred patient for outpatient urology follow-up.  Urine culture was negative.  She again presented to ER after 2 days with similar symptoms.  Patient has 25-pack-year smoking history, quit approximately 20 years ago.  No family history of any urology malignancies 07/03/2018 another CT abdomen pelvis with contrast was done which showed no nephrolithiasis or hydronephrosis is identified.  Bladder is decompressed limiting evaluation.  07/16/2018.urology Dr. [name] - cystoscopy and bilateral retrograde pyelogram on 07/16/2018.  Pyelogram did not show any filling defect or abnormalities.  No hydronephrosis.  Ureteral orifice was not involved with tumor.  There is a large 5 cm posterior wall bladder tumor, bullous and sessile appearing.  Patient underwent TURBT.   Pathology: High-grade urothelial carcinoma, invasive into muscularis propria.  Lymphovascular invasion is present.  Carcinoma in situ is also  identified.  Focal squamous differentiation is noted, areas of invasive carcinoma display pleomorphic/sarcomatoid changes.  T2b  08/07/2018 CT without contrast negative.  2 subpleural right upper lobe nodule 2 to 3 mm likely benign. She also had baseline audiometry done.  08/04/2018 ddMVAC x 1 cycle, stopped due to intolerance and AKI.  Patient received 1 cycle of dd MVAC, not able to tolerate due to AKI. Patient then was referred to [facility] urology  09/22/2018 patient underwent a cystectomy, pathology pT3a N0 Mx.  11 lymph nodes were harvested and was all negative. Invasive urothelia carcinoma, high grade, with sarcomatoid features.   10/14/2018 patient was admitted due to pyelonephritis and a pelvic fluid collection.  Drain was placed. 10/23/2018 drain was removed. Patient has had difficulties getting to her appointments to [facility]. 02/09/2019, patient presented with abdominal pain. CT concerning for small bowel obstruction and increased size of right pelvic fluid collection concerning for cancer recurrence.  Right hydronephrosis to the level of pelvis and enlarged lymph nodes. Patient had JP drain placed with CT guidance to pelvic fluid collection and drained 400 cc amber fluid.  Culture was negative for growth of microorganisms and cytology was negative for malignancy.-JP drain was removed on the day of discharge. CT-guided core biopsy of pelvic lymph node adenopathy was attempted but not successful.  # Right lower extremity DVT, provoked by Transvenous biopsy  02/26/2019 transvenous biopsy by IR unsuccessful transvenous biopsy of the right pelvis mass. Post biopsy acute thrombus in the right external iliac and common femoral vein.  Patient was recommended to start anticoagulation with Xarelto however due to the co-pay, patient is not able to afford the medication. Anticoagulation regimen was switched to Eliquis 5 mg.  Patient reports that she has been taking  it once a day. 03/20/2019 PET showed  FDG avid tissue in the cystectomy bed, retroperitoneal and pelvic lymphadenopathy.  Right common iliac DVT.  # increased right lower extremity swelling.  She was started on Eliquis for anticoagulation by [facility].  We clarified with her pharmacy and she was actually taking Eliquis 2.5 mg twice daily. Right lower extremity swelling has not improved but instead worsened. 03/16/2019 She had ultrasound right lower extremity done which showed persistent extensive proximal right lower extremity DVT.  Anticoagulation regimen has increased to Eliquis 5 mg twice daily.  # establish care with [facility] oncology Dr. [name] for evaluation.  Dr. [name] recommended starting immunotherapy with PD-L1 inhibitor Pembrolizumab 200 mg every 3 weeks.  The sarcomatoid histology may not respond to chemotherapy well, could portend a better chance of response into immunotherapy # Dr. [name] recommended PET scan in 14 weeks.  # 03/30/2019 Status post IVC filter placement, mechanical thrombectomy. # PD-L1 CPS 100%.  # 03/31/2019 started on immunotherapy Keytruda.   INTERVAL HISTORY Angela Russell is a 69 y.o. female who has above history reviewed by me today presents for follow up visit for evaluation form metastatic high-grade urothelial carcinoma of the bladder. Sarcomatoid feature.  Patient has been doing well on immunotherapy with Keytruda.  Patient reports doing well today. That is good. She has gained weight.,  2 pounds since last visit. She takes Eliquis 5 mg twice daily.  Status post IVC filter placement and mechanical thrombectomy. She tolerates anticoagulation well.  Other than history of intermittent small amount of bloody vaginal discharge, she does not have any other bleeding events.  We have discussed previously and asked her to establish care with gynecology.  Review of Systems  Constitutional: Negative for appetite change, chills, fatigue and fever.  HENT:   Negative for hearing loss and voice change.     Eyes: Negative for eye problems.  Respiratory: Negative for chest tightness and cough.   Cardiovascular: Negative for chest pain and leg swelling.  Gastrointestinal: Negative for abdominal distention, abdominal pain and blood in stool.  Endocrine: Negative for hot flashes.  Genitourinary: Negative for difficulty urinating and frequency.   Musculoskeletal: Negative for arthralgias.  Skin: Negative for itching and rash.  Neurological: Negative for extremity weakness.  Hematological: Negative for adenopathy.  Psychiatric/Behavioral: Negative for confusion. The patient is not nervous/anxious.     MEDICAL HISTORY:  Past Medical History:  Diagnosis Date  . Carotid artery plaque, right 01/2014  . CKD (chronic kidney disease)    stage 2-3  . Diabetes mellitus without complication ([place])   . DM (diabetes mellitus), type 2, uncontrolled ([place])   . Hyperlipidemia   . Hypertension   . Hypochromic microcytic anemia    mild  . Metastatic urothelial carcinoma ([place]) 03/23/2019  . Osteoporosis   . Renal insufficiency   . Rotator cuff tendonitis, right     SURGICAL HISTORY: Past Surgical History:  Procedure Laterality Date  . ABDOMINAL HYSTERECTOMY  2000   due to bleeding and fibroids, partial- still has ovaries  . CYSTOSCOPY W/ RETROGRADES Bilateral 07/16/2018   Procedure: CYSTOSCOPY WITH RETROGRADE PYELOGRAM;  Surgeon: Billey Co, MD;  Location: [facility] ORS;  Service: Urology;  Laterality: Bilateral;  . IVC FILTER INSERTION N/A 03/30/2019   Procedure: IVC FILTER INSERTION;  Surgeon: Algernon Huxley, MD;  Location: [place] [facility];  Service: Cardiovascular;  Laterality: N/A;  . IVC FILTER REMOVAL N/A 06/15/2019   Procedure: IVC FILTER REMOVAL;  Surgeon: Algernon Huxley, MD;  Location: [place] [facility];  Service: Cardiovascular;  Laterality: N/A;  . KNEE SURGERY Left 03/17/2013   torn meniscus  . PERIPHERAL VASCULAR THROMBECTOMY Right 03/30/2019   Procedure: PERIPHERAL VASCULAR  THROMBECTOMY;  Surgeon: Algernon Huxley, MD;  Location: [place] [facility];  Service: Cardiovascular;  Laterality: Right;  . PORTA CATH INSERTION N/A 08/06/2018   Procedure: PORTA CATH INSERTION;  Surgeon: Algernon Huxley, MD;  Location: [place] [facility];  Service: Cardiovascular;  Laterality: N/A;  . TRANSURETHRAL RESECTION OF BLADDER TUMOR N/A 07/16/2018   Procedure: TRANSURETHRAL RESECTION OF BLADDER TUMOR (TURBT);  Surgeon: Billey Co, MD;  Location: [facility] ORS;  Service: Urology;  Laterality: N/A;    SOCIAL HISTORY: Social History   Socioeconomic History  . Marital status: Single    Spouse name: Not on file  . Number of children: 1  . Years of education: Not on file  . Highest education level: 10th grade  Occupational History  . Not on file  Tobacco Use  . Smoking status: Former Smoker    Quit date: 06/05/1991    Years since quitting: 28.1  . Smokeless tobacco: Never Used  Substance and Sexual Activity  . Alcohol use: No  . Drug use: No  . Sexual activity: Never  Other Topics Concern  . Not on file  Social History Narrative   Working full time   Social Determinants of Radio broadcast assistant Strain:   . Difficulty of Paying Living Expenses: Not on file  Food Insecurity:   . Worried About Charity fundraiser in the Last Year: Not on file  . Ran Out of Food in the Last Year: Not on file  Transportation Needs:   . Lack of Transportation (Medical): Not on file  . Lack of Transportation (Non-Medical): Not on file  Physical Activity:   . Days of Exercise per Week: Not on file  . Minutes of Exercise per Session: Not on file  Stress:   . Feeling of Stress : Not on file  Social Connections:   . Frequency of Communication with Friends and Family: Not on file  . Frequency of Social Gatherings with Friends and Family: Not on file  . Attends Religious Services: Not on file  . Active Member of Clubs or Organizations: Not on file  . Attends Archivist Meetings:  Not on file  . Marital Status: Not on file  Intimate Partner Violence:   . Fear of Current or Ex-Partner: Not on file  . Emotionally Abused: Not on file  . Physically Abused: Not on file  . Sexually Abused: Not on file    FAMILY HISTORY: Family History  Problem Relation Age of Onset  . Heart disease Mother   . Heart attack Mother   . Arthritis Father   . Diabetes Brother     ALLERGIES:  has No Known Allergies.  MEDICATIONS:  Current Outpatient Medications  Medication Sig Dispense Refill  . amLODipine (NORVASC) 5 MG tablet Take 1 tablet (5 mg total) by mouth daily. 90 tablet 1  . apixaban (ELIQUIS) 5 MG TABS tablet Take 1 tablet (5 mg total) by mouth 2 (two) times daily. 60 tablet 3  . diclofenac sodium (VOLTAREN) 1 % GEL Apply 2 g topically 4 (four) times daily. 100 g 2  . lidocaine-prilocaine (EMLA) cream Apply to affected area once 30 g 3  . polyethylene glycol (MIRALAX / GLYCOLAX) 17 g packet Take 17 g by mouth daily as needed for mild  constipation. 14 each 0  . rosuvastatin (CRESTOR) 20 MG tablet Take 1 tablet (20 mg total) by mouth daily. 90 tablet 1   No current facility-administered medications for this visit.     PHYSICAL EXAMINATION: ECOG PERFORMANCE STATUS: 0 - Asymptomatic Vitals:   07/14/19 0928  BP: (!) 158/79  Pulse: 83  Resp: 16  Temp: (!) 96.7 F (35.9 C)   Filed Weights   07/14/19 0928  Weight: 195 lb 6.4 oz (88.6 kg)    Physical Exam Constitutional:      General: She is not in acute distress.    Comments: Walk independently  HENT:     Head: Normocephalic and atraumatic.  Eyes:     General: No scleral icterus.    Pupils: Pupils are equal, round, and reactive to light.  Cardiovascular:     Rate and Rhythm: Normal rate and regular rhythm.     Heart sounds: Normal heart sounds.  Pulmonary:     Effort: Pulmonary effort is normal. No respiratory distress.     Breath sounds: No wheezing.  Abdominal:     General: Bowel sounds are normal.  There is no distension.     Palpations: Abdomen is soft. There is no mass.     Tenderness: There is no abdominal tenderness.  Musculoskeletal:        General: No deformity. Normal range of motion.     Cervical back: Normal range of motion and neck supple.     Comments: Trace right lower extremity edema  Skin:    General: Skin is warm and dry.     Coloration: Skin is not pale.     Findings: No erythema or rash.  Neurological:     Mental Status: She is alert and oriented to person, place, and time. Mental status is at baseline.     Cranial Nerves: No cranial nerve deficit.     Coordination: Coordination normal.  Psychiatric:        Mood and Affect: Mood normal.        Behavior: Behavior normal.        Thought Content: Thought content normal.     RADIOGRAPHIC STUDIES: I have personally reviewed the radiological images as listed and agreed with the findings in the report.  CMP Latest Ref Rng & Units 07/14/2019  Glucose 70 - 99 mg/dL 134(H)  BUN 8 - 23 mg/dL 37(H)  Creatinine 0.44 - 1.00 mg/dL 1.56(H)  Sodium 135 - 145 mmol/L 140  Potassium 3.5 - 5.1 mmol/L 4.1  Chloride 98 - 111 mmol/L 110  CO2 22 - 32 mmol/L 21(L)  Calcium 8.9 - 10.3 mg/dL 9.2  Total Protein 6.5 - 8.1 g/dL 7.8  Total Bilirubin 0.3 - 1.2 mg/dL 0.5  Alkaline Phos 38 - 126 U/L 103  AST 15 - 41 U/L 19  ALT 0 - 44 U/L 12   CBC Latest Ref Rng & Units 07/14/2019  WBC 4.0 - 10.5 K/uL 4.9  Hemoglobin 12.0 - 15.0 g/dL 9.8(L)  Hematocrit 36.0 - 46.0 % 32.7(L)  Platelets 150 - 400 K/uL 227    LABORATORY DATA:  I have reviewed the data as listed Lab Results  Component Value Date   WBC 4.9 07/14/2019   HGB 9.8 (L) 07/14/2019   HCT 32.7 (L) 07/14/2019   MCV 79.8 (L) 07/14/2019   PLT 227 07/14/2019   Recent Labs    12/19/18 1841 12/20/18 0004 06/22/19 1701 06/23/19 0920 07/14/19 0908  NA 137   < > 141 140  140  K 6.3*   < > 4.5 4.2 4.1  CL 115*   < > 105 108 110  CO2 15*   < > 23 23 21*  GLUCOSE 129*   < >  70 127* 134*  BUN 37*   < > 23 30* 37*  CREATININE 2.03*   < > 1.47* 1.42* 1.56*  CALCIUM 9.7   < > 9.7 9.2 9.2  GFRNONAA 24*   < > 36* 38* 34*  GFRAA 28*   < > 42* 44* 39*  PROT  --    < > 7.4 7.7 7.8  ALBUMIN  --    < > 4.3 4.0 4.0  AST  --    < > _0 ALT  --    < > _1 ALKPHOS  --    < > 130* 98 103  BILITOT 0.5   < > 0.2 0.4 0.5  BILIDIR <0.1  --   --   --   --   IBILI NOT CALCULATED  --   --   --   --    < > = values in this interval not displayed.   Iron/TIBC/Ferritin/ %Sat    Component Value Date/Time   IRON 46 03/04/2019 1423   IRON 26 (L) 02/04/2019 1309   TIBC 267 03/04/2019 1423   TIBC 251 02/04/2019 1309   FERRITIN 316 (H) 03/04/2019 1423   FERRITIN 409 (H) 02/04/2019 1309   IRONPCTSAT 17 03/04/2019 1423   IRONPCTSAT 10 (L) 02/04/2019 1309    RADIOGRAPHIC STUDIES: I have personally reviewed the radiological images as listed and agreed with the findings in the report. PERIPHERAL VASCULAR CATHETERIZATION  Result Date: 06/15/2019 See op note  NM PET Image Restag (PS) Skull Base To Thigh  Result Date: 06/18/2019 CLINICAL DATA:  Subsequent treatment strategy for metastatic urothelial carcinoma. EXAM: NUCLEAR MEDICINE PET SKULL BASE TO THIGH TECHNIQUE: 10.3 mCi F-18 FDG was injected intravenously. Full-ring PET imaging was performed from the skull base to thigh after the radiotracer. CT data was obtained and used for attenuation correction and anatomic localization. Fasting blood glucose: 127 mg/dl COMPARISON:  Multiple exams, including CT examinations from 10/13/2018 and 08/07/2018 FINDINGS: Mediastinal blood pool activity: SUV max 2.8 Liver activity: SUV max NA NECK: Incidental physiologic muscular activity. Incidental CT findings: Atherosclerotic calcification of the left common carotid artery. CHEST: The tiny right upper lobe nodules seen on the CT from 08/07/2018 are not changed compared to the prior exam. These lesions are not hypermetabolic but are below  sensitive PET-CT size thresholds, and measure in the 2-3 mm diameter range. Incidental CT findings: Right Port-A-Cath tip: Right atrium. Right subcoracoid bursitis. Aberrant right subclavian artery passes behind the esophagus. Coronary, aortic arch, and branch vessel atherosclerotic vascular disease. ABDOMEN/PELVIS: A hypermetabolic lymph node in the upper pelvic mesentery slightly eccentric to the right measures 1.1 cm in short axis on image 184/3, and has a maximum SUV of 9.6, highly suspicious for malignancy. Excreted FDG noted in the ileal loop. Incidental CT findings: Peristomal herniation of small bowel loop without strangulation or obstruction. Prominent stool throughout the colon favors constipation. Aortoiliac atherosclerotic vascular disease. No current hydronephrosis. Right iliac vein stent. Persistent likely loculated fluid collections in the pelvis and along the left pelvic sidewall. SKELETON: No significant abnormal hypermetabolic activity in this region. Incidental CT findings: Degenerative arthropathy of both hips. IMPRESSION: 1. A single mildly enlarged central mesenteric lymph node in the upper pelvis has a maximum  SUV of 9.6, suspicious for recurrent malignancy. 2. Peristomal herniation of a small bowel loop without strangulation or obstruction. 3. Other imaging findings of potential clinical significance: Aortic Atherosclerosis (ICD10-I70.0). Coronary atherosclerosis. Aberrant right subclavian artery. Right subcoracoid bursitis. Prominent stool throughout the colon favors constipation. Persistent loculated fluid collections in the pelvis and along the left pelvic sidewall (query postoperative seromas). Electronically Signed   By: Van Clines M.D.   On: 06/18/2019 12:57   DVT (Right)  Result Date: 06/09/2019  Lower Venous Study Other Indications: Chronic DVT right CFV. Risk Factors: Surgery Rt CFV thrombectomy 03/30/2019. Comparison Study: 03/27/2019 Performing Technologist: Concha Norway  RVT  Examination Guidelines: A complete evaluation includes B-mode imaging, spectral Doppler, color Doppler, and power Doppler as needed of all accessible portions of each vessel. Bilateral testing is considered an integral part of a complete examination. Limited examinations for reoccurring indications may be performed as noted.  +---------+---------------+---------+-----------+----------+--------------+ RIGHT    CompressibilityPhasicitySpontaneityPropertiesThrombus Aging +---------+---------------+---------+-----------+----------+--------------+ CFV      Full           Yes      Yes                                 +---------+---------------+---------+-----------+----------+--------------+ SFJ      Full           Yes      Yes                                 +---------+---------------+---------+-----------+----------+--------------+ FV Prox  Full                                                        +---------+---------------+---------+-----------+----------+--------------+ FV Mid   Full           Yes      Yes                                 +---------+---------------+---------+-----------+----------+--------------+ FV DistalFull                                                        +---------+---------------+---------+-----------+----------+--------------+ PFV      Full           Yes      Yes                                 +---------+---------------+---------+-----------+----------+--------------+ POP      Full           Yes      Yes                                 +---------+---------------+---------+-----------+----------+--------------+ PTV      Full           Yes      Yes                                 +---------+---------------+---------+-----------+----------+--------------+  PERO     Full           Yes      Yes                                 +---------+---------------+---------+-----------+----------+--------------+ Gastroc  Full            Yes      Yes                                 +---------+---------------+---------+-----------+----------+--------------+ GSV      Full           Yes      Yes                                 +---------+---------------+---------+-----------+----------+--------------+ SSV      Full           Yes                                          +---------+---------------+---------+-----------+----------+--------------+   +----+---------------+---------+-----------+----------+--------------+ LEFTCompressibilityPhasicitySpontaneityPropertiesThrombus Aging +----+---------------+---------+-----------+----------+--------------+ CFV Full           Yes      Yes                                 +----+---------------+---------+-----------+----------+--------------+ SFJ Full           Yes      Yes                                 +----+---------------+---------+-----------+----------+--------------+     Summary: Right: There is no evidence of deep vein thrombosis in the lower extremity.There is no evidence of superficial venous thrombosis. Left: No evidence of common femoral vein obstruction.  *See table(s) above for measurements and observations. Electronically signed by Leotis Pain MD on 06/09/2019 at 9:11:11 AM.    Final    Patient had PET scan done 03/20/2019 which showed enhancing soft nodule in the cystectomy bed is worrisome for local recurrence. Right greater than left pelvic retroperitoneal and mesenteric lymphadenopathy consistent with nodal metastatic disease Deep vein thrombosis is seen in the right common iliac vein.  The upstream right external iliac vein is severely narrowed by right pelvic sidewall lymphadenopathy and postsurgical changes.  The right lower extremity appears enlarged and erythematous, likely due to venous insufficiency.  Loculated pelvic fluid collection appears smaller compared to February 09, 2019 scan.  ASSESSMENT & PLAN:  1. Metastatic urothelial carcinoma  ([place])   2. Anemia in stage 3a chronic kidney disease   3. Encounter for antineoplastic immunotherapy   4. History of DVT of lower extremity   5. Chronic anticoagulation   Cancer Staging Bladder carcinoma [facility]) Staging form: Urinary Bladder, AJCC 8th Edition - Clinical stage from 08/01/2018: Stage IVA (cTX, cN3, cM1a) - Signed by [name], MD on 04/21/2019  #Metastatic urothelial carcinoma of bladder, sarcomatoid features pelvic sidewall mass showed metastatic carcinoma consistent with involvement by urothelial carcinoma. Interval PET scan has showed treatment response.  She only has 1 single central mesenteric lymph node in the upper  pelvis with an SUV 9.6. No other reportable hypermetabolic activity  Labs are reviewed and discussed with patient. Continue Keytruda every 3 weeks. Proceed with Keytruda today  #Weight gain, TSH has been recently monitored and has been normal.  We will monitor thyroid function every 6 weeks. Discussed with patient about exercise and healthy diet.  #Right lower extremity  femoral vein DVT secondary to transvenous biopsy in the context of cancer recurrence Currently on Eliquis 5 mg twice daily.  She tolerates well. She has had IVC filter which needs to be removed in the future. I will repeat an ultrasound in March. Maintenance therapy depending on ultrasound results and her cancer status.  #Bloody vaginal discharge, recommend patient to see her gynecologist and have a pelvic examination. #Anemia in CKD.Marland Kitchen  Hemoglobin 9.8, stable.  Continue to monitor.  Erythropoietin therapy is contraindicated due to active cancer.   All questions were answered. The patient knows to call the clinic with any problems questions or concerns.  Return of visit: 3 weeks.    [name], MD, PhD Hematology Oncology [facility] Pager- [phone] 07/14/19

## 2019-07-15 ENCOUNTER — Telehealth: Payer: Self-pay

## 2019-07-15 NOTE — Telephone Encounter (Signed)
SW contacted patient to discuss referral for palliative care services. Provided education on palliative care services. Patient said she is not interested in services at this time. SW to notify team.

## 2019-07-28 ENCOUNTER — Other Ambulatory Visit: Payer: Self-pay

## 2019-07-28 ENCOUNTER — Encounter (INDEPENDENT_AMBULATORY_CARE_PROVIDER_SITE_OTHER): Payer: Self-pay | Admitting: Nurse Practitioner

## 2019-07-28 ENCOUNTER — Ambulatory Visit (INDEPENDENT_AMBULATORY_CARE_PROVIDER_SITE_OTHER): Payer: Medicare Other | Admitting: Nurse Practitioner

## 2019-07-28 VITALS — BP 135/76 | HR 98 | Resp 14 | Ht 64.0 in | Wt 195.0 lb

## 2019-07-28 DIAGNOSIS — I152 Hypertension secondary to endocrine disorders: Secondary | ICD-10-CM

## 2019-07-28 DIAGNOSIS — I82411 Acute embolism and thrombosis of right femoral vein: Secondary | ICD-10-CM | POA: Diagnosis not present

## 2019-07-28 DIAGNOSIS — I10 Essential (primary) hypertension: Secondary | ICD-10-CM

## 2019-07-28 DIAGNOSIS — E1159 Type 2 diabetes mellitus with other circulatory complications: Secondary | ICD-10-CM

## 2019-07-28 DIAGNOSIS — C679 Malignant neoplasm of bladder, unspecified: Secondary | ICD-10-CM

## 2019-07-28 NOTE — Progress Notes (Signed)
SUBJECTIVE:  Patient ID: Angela Russell, female    DOB: 05-19-1951, 69 y.o.   MRN: [id number] Chief Complaint  Patient presents with  . Follow-up    6 wk [facility] POST IVC FILTER REMOVAL     HPI  Angela Russell is a 69 y.o. female that presents today after IVC filter removal on 06/15/2019.  The patient has healed following IVC filter removal and there is currently a very small scar left.  Patient denies any fever, chills, nausea, vomiting or diarrhea.  She denies any signs symptoms of infection.  The patient has minimal swelling in her right lower extremity as well as her left.  She states that since we last saw her in December the swelling has decreased greatly.  The patient generally wears medical grade 1 compression stockings as well as elevates her lower extremities.  Overall she is doing well.  The patient is also undergoing treatment for a malignant process at this time.  She denies any current issues with her Eliquis.  Past Medical History:  Diagnosis Date  . Carotid artery plaque, right 01/2014  . CKD (chronic kidney disease)    stage 2-3  . Diabetes mellitus without complication ([place])   . DM (diabetes mellitus), type 2, uncontrolled ([place])   . Hyperlipidemia   . Hypertension   . Hypochromic microcytic anemia    mild  . Metastatic urothelial carcinoma ([place]) 03/23/2019  . Osteoporosis   . Renal insufficiency   . Rotator cuff tendonitis, right     Past Surgical History:  Procedure Laterality Date  . ABDOMINAL HYSTERECTOMY  2000   due to bleeding and fibroids, partial- still has ovaries  . CYSTOSCOPY W/ RETROGRADES Bilateral 07/16/2018   Procedure: CYSTOSCOPY WITH RETROGRADE PYELOGRAM;  Surgeon: Billey Co, MD;  Location: [facility];  Service: Urology;  Laterality: Bilateral;  . IVC FILTER INSERTION N/A 03/30/2019   Procedure: IVC FILTER INSERTION;  Surgeon: Algernon Huxley, MD;  Location: [place] [facility];  Service: Cardiovascular;  Laterality: N/A;  . IVC FILTER  REMOVAL N/A 06/15/2019   Procedure: IVC FILTER REMOVAL;  Surgeon: Algernon Huxley, MD;  Location: [place] [facility];  Service: Cardiovascular;  Laterality: N/A;  . KNEE SURGERY Left 03/17/2013   torn meniscus  . PERIPHERAL VASCULAR THROMBECTOMY Right 03/30/2019   Procedure: PERIPHERAL VASCULAR THROMBECTOMY;  Surgeon: Algernon Huxley, MD;  Location: [place] [facility];  Service: Cardiovascular;  Laterality: Right;  . PORTA CATH INSERTION N/A 08/06/2018   Procedure: PORTA CATH INSERTION;  Surgeon: Algernon Huxley, MD;  Location: [place] [facility];  Service: Cardiovascular;  Laterality: N/A;  . TRANSURETHRAL RESECTION OF BLADDER TUMOR N/A 07/16/2018   Procedure: TRANSURETHRAL RESECTION OF BLADDER TUMOR (TURBT);  Surgeon: Billey Co, MD;  Location: [facility];  Service: Urology;  Laterality: N/A;    Social History   Socioeconomic History  . Marital status: Single    Spouse name: Not on file  . Number of children: 1  . Years of education: Not on file  . Highest education level: 10th grade  Occupational History  . Not on file  Tobacco Use  . Smoking status: Former Smoker    Quit date: 06/05/1991    Years since quitting: 28.1  . Smokeless tobacco: Never Used  Substance and Sexual Activity  . Alcohol use: No  . Drug use: No  . Sexual activity: Never  Other Topics Concern  . Not on file  Social History Narrative   Working  full time   Social Determinants of Health   Financial Resource Strain:   . Difficulty of Paying Living Expenses: Not on file  Food Insecurity:   . Worried About Charity fundraiser in the Last Year: Not on file  . Ran Out of Food in the Last Year: Not on file  Transportation Needs:   . Lack of Transportation (Medical): Not on file  . Lack of Transportation (Non-Medical): Not on file  Physical Activity:   . Days of Exercise per Week: Not on file  . Minutes of Exercise per Session: Not on file  Stress:   . Feeling of Stress : Not on file  Social Connections:     . Frequency of Communication with Friends and Family: Not on file  . Frequency of Social Gatherings with Friends and Family: Not on file  . Attends Religious Services: Not on file  . Active Member of Clubs or Organizations: Not on file  . Attends Archivist Meetings: Not on file  . Marital Status: Not on file  Intimate Partner Violence:   . Fear of Current or Ex-Partner: Not on file  . Emotionally Abused: Not on file  . Physically Abused: Not on file  . Sexually Abused: Not on file    Family History  Problem Relation Age of Onset  . Heart disease Mother   . Heart attack Mother   . Arthritis Father   . Diabetes Brother     No Known Allergies   Review of Systems   Review of Systems: Negative Unless Checked Constitutional: [] Weight loss  [] Fever  [] Chills Cardiac: [] Chest pain   []  Atrial Fibrillation  [] Palpitations   [] Shortness of breath when laying flat   [] Shortness of breath with exertion. [] Shortness of breath at rest Vascular:  [] Pain in legs with walking   [] Pain in legs with standing [] Pain in legs when laying flat   [] Claudication    [] Pain in feet when laying flat    [x] History of DVT   [] Phlebitis   [x] Swelling in legs   [] Varicose veins   [] Non-healing ulcers Pulmonary:   [] Uses home oxygen   [] Productive cough   [] Hemoptysis   [] Wheeze  [] COPD   [] Asthma Neurologic:  [] Dizziness   [] Seizures  [] Blackouts [] History of stroke   [] History of TIA  [] Aphasia   [] Temporary Blindness   [] Weakness or numbness in arm   [] Weakness or numbness in leg Musculoskeletal:   [] Joint swelling   [] Joint pain   [] Low back pain  []  History of Knee Replacement [] Arthritis [] back Surgeries  []  Spinal Stenosis    Hematologic:  [] Easy bruising  [] Easy bleeding   [] Hypercoagulable state   [x] Anemic Gastrointestinal:  [] Diarrhea   [] Vomiting  [] Gastroesophageal reflux/heartburn   [] Difficulty swallowing. [] Abdominal pain Genitourinary:  [x] Chronic kidney disease   [] Difficult  urination  [] Anuric   [] Blood in urine [] Frequent urination  [] Burning with urination   [] Hematuria Skin:  [] Rashes   [] Ulcers [] Wounds Psychological:  [] History of anxiety   []  History of major depression  []  Memory Difficulties      OBJECTIVE:   Physical Exam  BP 135/76 (BP Location: Left Arm)   Pulse 98   Resp 14   Ht 5\' 4"  (1.626 m)   Wt 195 lb (88.5 kg)   BMI 33.47 kg/m   Gen: WD/WN, NAD Head: [place]/AT, No temporalis wasting.  Ear/Nose/Throat: Hearing grossly intact, nares w/o erythema or drainage Eyes: PER, EOMI, sclera nonicteric.  Neck: Supple, no masses.  No JVD.  Pulmonary:  Good air movement, no use of accessory muscles.  Cardiac: RRR Vascular:  1+ edema right, none left Vessel Right Left  Radial Palpable Palpable   Gastrointestinal: soft, non-distended. No guarding/no peritoneal signs.  Musculoskeletal: M/S 5/5 throughout.  No deformity or atrophy.  Neurologic: Pain and light touch intact in extremities.  Symmetrical.  Speech is fluent. Motor exam as listed above. Psychiatric: Judgment intact, Mood & affect appropriate for pt's clinical situation. Dermatologic: No Venous rashes. No Ulcers Noted.  No changes consistent with cellulitis. Lymph : No Cervical lymphadenopathy, no lichenification or skin changes of chronic lymphedema.       ASSESSMENT AND PLAN:  1. Acute deep vein thrombosis (DVT) of femoral vein of right lower extremity (HCC) Previous DVT study done on 06/02/2019 showed no evidence of DVT within the right lower extremity.  Patient recently underwent IVC filter removal on 06/15/2019.  The patient's wound site is completely healed at this time.  Patient advised signs symptoms of DVT, such as sudden swelling warmth, redness and pain.  The patient should experience any of the symptoms she should contact her office for evaluation, otherwise the patient will follow up on an as-needed basis.  2. Hypertension associated with diabetes ([place]) Good blood pressure  control today.  Patient on appropriate medications.  No changes needed today.  3. Bladder carcinoma [facility]) Discussed with patient about stopping anticoagulation.  From a vascular perspective, typically 6 months following if first DVT is adequate however the patient is currently undergoing treatment for cancer.  Based on her initial diagnosis this will be about Oct 03, 2019.  However, malignant processes are known to make patient is hyper coagulable.  Based on this I have advised her to speak with Dr. Tasia Catchings when she follows with her in March regarding her thoughts on whether anticoagulation should be continued or if she would be able to stop after 6 months.   Current Outpatient Medications on File Prior to Visit  Medication Sig Dispense Refill  . amLODipine (NORVASC) 5 MG tablet Take 1 tablet (5 mg total) by mouth daily. 90 tablet 1  . apixaban (ELIQUIS) 5 MG TABS tablet Take 1 tablet (5 mg total) by mouth 2 (two) times daily. 60 tablet 3  . diclofenac sodium (VOLTAREN) 1 % GEL Apply 2 g topically 4 (four) times daily. 100 g 2  . lidocaine-prilocaine (EMLA) cream Apply to affected area once 30 g 3  . polyethylene glycol (MIRALAX / GLYCOLAX) 17 g packet Take 17 g by mouth daily as needed for mild constipation. 14 each 0  . rosuvastatin (CRESTOR) 20 MG tablet Take 1 tablet (20 mg total) by mouth daily. 90 tablet 1  . [DISCONTINUED] prochlorperazine (COMPAZINE) 10 MG tablet Take 1 tablet (10 mg total) by mouth every 6 (six) hours as needed (Nausea or vomiting). 30 tablet 1   No current facility-administered medications on file prior to visit.    There are no Patient Instructions on file for this visit. No follow-ups on file.   Kris Hartmann, NP  This note was completed with Sales executive.  Any errors are purely unintentional.

## 2019-08-04 ENCOUNTER — Encounter: Payer: Self-pay | Admitting: Oncology

## 2019-08-04 ENCOUNTER — Inpatient Hospital Stay: Payer: Medicare Other

## 2019-08-04 ENCOUNTER — Other Ambulatory Visit: Payer: Self-pay

## 2019-08-04 ENCOUNTER — Inpatient Hospital Stay: Payer: Medicare Other | Attending: Oncology

## 2019-08-04 ENCOUNTER — Inpatient Hospital Stay (HOSPITAL_BASED_OUTPATIENT_CLINIC_OR_DEPARTMENT_OTHER): Payer: Medicare Other | Admitting: Oncology

## 2019-08-04 VITALS — Resp 18

## 2019-08-04 VITALS — BP 165/80 | HR 79 | Temp 97.3°F | Wt 196.8 lb

## 2019-08-04 DIAGNOSIS — I129 Hypertensive chronic kidney disease with stage 1 through stage 4 chronic kidney disease, or unspecified chronic kidney disease: Secondary | ICD-10-CM | POA: Insufficient documentation

## 2019-08-04 DIAGNOSIS — Z95828 Presence of other vascular implants and grafts: Secondary | ICD-10-CM

## 2019-08-04 DIAGNOSIS — Z86718 Personal history of other venous thrombosis and embolism: Secondary | ICD-10-CM | POA: Insufficient documentation

## 2019-08-04 DIAGNOSIS — R1033 Periumbilical pain: Secondary | ICD-10-CM | POA: Insufficient documentation

## 2019-08-04 DIAGNOSIS — Z5112 Encounter for antineoplastic immunotherapy: Secondary | ICD-10-CM

## 2019-08-04 DIAGNOSIS — N898 Other specified noninflammatory disorders of vagina: Secondary | ICD-10-CM | POA: Insufficient documentation

## 2019-08-04 DIAGNOSIS — Z87891 Personal history of nicotine dependence: Secondary | ICD-10-CM | POA: Insufficient documentation

## 2019-08-04 DIAGNOSIS — C7910 Secondary malignant neoplasm of unspecified urinary organs: Secondary | ICD-10-CM | POA: Diagnosis not present

## 2019-08-04 DIAGNOSIS — D649 Anemia, unspecified: Secondary | ICD-10-CM | POA: Diagnosis not present

## 2019-08-04 DIAGNOSIS — R7989 Other specified abnormal findings of blood chemistry: Secondary | ICD-10-CM | POA: Insufficient documentation

## 2019-08-04 DIAGNOSIS — R1010 Upper abdominal pain, unspecified: Secondary | ICD-10-CM | POA: Diagnosis not present

## 2019-08-04 DIAGNOSIS — R0781 Pleurodynia: Secondary | ICD-10-CM | POA: Diagnosis not present

## 2019-08-04 DIAGNOSIS — R200 Anesthesia of skin: Secondary | ICD-10-CM | POA: Diagnosis not present

## 2019-08-04 DIAGNOSIS — E1165 Type 2 diabetes mellitus with hyperglycemia: Secondary | ICD-10-CM | POA: Insufficient documentation

## 2019-08-04 DIAGNOSIS — Z7901 Long term (current) use of anticoagulants: Secondary | ICD-10-CM | POA: Insufficient documentation

## 2019-08-04 DIAGNOSIS — E785 Hyperlipidemia, unspecified: Secondary | ICD-10-CM | POA: Insufficient documentation

## 2019-08-04 DIAGNOSIS — Z90710 Acquired absence of both cervix and uterus: Secondary | ICD-10-CM | POA: Insufficient documentation

## 2019-08-04 DIAGNOSIS — Z79899 Other long term (current) drug therapy: Secondary | ICD-10-CM | POA: Diagnosis not present

## 2019-08-04 DIAGNOSIS — C679 Malignant neoplasm of bladder, unspecified: Secondary | ICD-10-CM | POA: Insufficient documentation

## 2019-08-04 DIAGNOSIS — M79631 Pain in right forearm: Secondary | ICD-10-CM | POA: Insufficient documentation

## 2019-08-04 DIAGNOSIS — N1831 Chronic kidney disease, stage 3a: Secondary | ICD-10-CM | POA: Diagnosis not present

## 2019-08-04 DIAGNOSIS — N183 Chronic kidney disease, stage 3 unspecified: Secondary | ICD-10-CM | POA: Insufficient documentation

## 2019-08-04 DIAGNOSIS — D631 Anemia in chronic kidney disease: Secondary | ICD-10-CM | POA: Diagnosis not present

## 2019-08-04 DIAGNOSIS — M810 Age-related osteoporosis without current pathological fracture: Secondary | ICD-10-CM | POA: Insufficient documentation

## 2019-08-04 LAB — CBC WITH DIFFERENTIAL/PLATELET
Abs Immature Granulocytes: 0.01 10*3/uL (ref 0.00–0.07)
Basophils Absolute: 0 10*3/uL (ref 0.0–0.1)
Basophils Relative: 0 %
Eosinophils Absolute: 0.1 10*3/uL (ref 0.0–0.5)
Eosinophils Relative: 1 %
HCT: 31.8 % — ABNORMAL LOW (ref 36.0–46.0)
Hemoglobin: 9.7 g/dL — ABNORMAL LOW (ref 12.0–15.0)
Immature Granulocytes: 0 %
Lymphocytes Relative: 20 %
Lymphs Abs: 1 10*3/uL (ref 0.7–4.0)
MCH: 24.1 pg — ABNORMAL LOW (ref 26.0–34.0)
MCHC: 30.5 g/dL (ref 30.0–36.0)
MCV: 78.9 fL — ABNORMAL LOW (ref 80.0–100.0)
Monocytes Absolute: 0.4 10*3/uL (ref 0.1–1.0)
Monocytes Relative: 8 %
Neutro Abs: 3.4 10*3/uL (ref 1.7–7.7)
Neutrophils Relative %: 71 %
Platelets: 213 10*3/uL (ref 150–400)
RBC: 4.03 MIL/uL (ref 3.87–5.11)
RDW: 13.8 % (ref 11.5–15.5)
WBC: 4.9 10*3/uL (ref 4.0–10.5)
nRBC: 0 % (ref 0.0–0.2)

## 2019-08-04 LAB — COMPREHENSIVE METABOLIC PANEL
ALT: 10 U/L (ref 0–44)
AST: 18 U/L (ref 15–41)
Albumin: 3.8 g/dL (ref 3.5–5.0)
Alkaline Phosphatase: 96 U/L (ref 38–126)
Anion gap: 10 (ref 5–15)
BUN: 27 mg/dL — ABNORMAL HIGH (ref 8–23)
CO2: 23 mmol/L (ref 22–32)
Calcium: 9 mg/dL (ref 8.9–10.3)
Chloride: 107 mmol/L (ref 98–111)
Creatinine, Ser: 1.5 mg/dL — ABNORMAL HIGH (ref 0.44–1.00)
GFR calc Af Amer: 41 mL/min — ABNORMAL LOW (ref >60)
GFR calc non Af Amer: 35 mL/min — ABNORMAL LOW (ref >60)
Glucose, Bld: 158 mg/dL — ABNORMAL HIGH (ref 70–99)
Potassium: 4.2 mmol/L (ref 3.5–5.1)
Sodium: 140 mmol/L (ref 135–145)
Total Bilirubin: 0.6 mg/dL (ref 0.3–1.2)
Total Protein: 7.4 g/dL (ref 6.5–8.1)

## 2019-08-04 LAB — TSH: TSH: 1.452 u[IU]/mL (ref 0.350–4.500)

## 2019-08-04 MED ORDER — HEPARIN SOD (PORK) LOCK FLUSH 100 UNIT/ML IV SOLN
INTRAVENOUS | Status: AC
  Filled 2019-08-04: qty 5

## 2019-08-04 MED ORDER — SODIUM CHLORIDE 0.9 % IV SOLN
Freq: Once | INTRAVENOUS | Status: AC
  Filled 2019-08-04: qty 250

## 2019-08-04 MED ORDER — SODIUM CHLORIDE 0.9 % IV SOLN
200.0000 mg | Freq: Once | INTRAVENOUS | Status: AC
  Administered 2019-08-04: 200 mg via INTRAVENOUS
  Filled 2019-08-04: qty 8

## 2019-08-04 MED ORDER — SODIUM CHLORIDE 0.9% FLUSH
10.0000 mL | Freq: Once | INTRAVENOUS | Status: AC
  Administered 2019-08-04: 10 mL via INTRAVENOUS
  Filled 2019-08-04: qty 10

## 2019-08-04 MED ORDER — HEPARIN SOD (PORK) LOCK FLUSH 100 UNIT/ML IV SOLN
500.0000 [IU] | Freq: Once | INTRAVENOUS | Status: AC | PRN
  Administered 2019-08-04: 12:00:00 500 [IU]
  Filled 2019-08-04: qty 5

## 2019-08-04 NOTE — Progress Notes (Signed)
Patient reports new episodes of left abdomen pain that is 7/10 on pain scale with the last episode being a few minutes ago.

## 2019-08-05 ENCOUNTER — Telehealth: Payer: Self-pay | Admitting: Obstetrics & Gynecology

## 2019-08-05 DIAGNOSIS — Z7901 Long term (current) use of anticoagulants: Secondary | ICD-10-CM | POA: Insufficient documentation

## 2019-08-05 DIAGNOSIS — Z5112 Encounter for antineoplastic immunotherapy: Secondary | ICD-10-CM | POA: Insufficient documentation

## 2019-08-05 DIAGNOSIS — R0781 Pleurodynia: Secondary | ICD-10-CM | POA: Insufficient documentation

## 2019-08-05 DIAGNOSIS — D631 Anemia in chronic kidney disease: Secondary | ICD-10-CM | POA: Insufficient documentation

## 2019-08-05 DIAGNOSIS — N1831 Chronic kidney disease, stage 3a: Secondary | ICD-10-CM | POA: Insufficient documentation

## 2019-08-05 NOTE — Telephone Encounter (Signed)
[place] [facility] referring for Chronic vaginal discharge.Attempt to reach patient the first time and due to service the phone call was dropped. Attempt to call patient back went straight to voicemail but voicemail box is full unable to leave message.

## 2019-08-05 NOTE — Progress Notes (Signed)
Hematology/Oncology follow up note [facility] [phone] [phone]) [phone]   Patient Care Team: Volney American, PA-C as PCP - General (Family Medicine) Idelle Leech, [place] (Optometry) [name], MD as Consulting Physician (Hematology and Oncology)  REFERRING PROVIDER: Dr.Sninsky CHIEF COMPLAINTS/REASON FOR VISIT:  Follow up for bladder cancer, anemia.   HISTORY OF PRESENTING ILLNESS:  Angela Russell is a  69 y.o.  female with PMH listed below who was referred to me for evaluation of newly diagnosed bladder cancer. Patient initially presented to emergency room at the end of January 2020 for evaluation of dysuria, hematuria and the left lower quadrant inguinal pain and flank pain.  1/28 2020 CT renal stone study showed suspected irregular wall thickening about the superior bladder, not well assessed due to degree of bladder distention.  Recommend cystoscopy for further evaluation.  No renal stone or obstructive uropathy.  Patient was given IV Rocephin and referred patient for outpatient urology follow-up.  Urine culture was negative.  She again presented to ER after 2 days with similar symptoms.  Patient has 25-pack-year smoking history, quit approximately 20 years ago.  No family history of any urology malignancies 07/03/2018 another CT abdomen pelvis with contrast was done which showed no nephrolithiasis or hydronephrosis is identified.  Bladder is decompressed limiting evaluation.  07/16/2018.urology Dr. [name] - cystoscopy and bilateral retrograde pyelogram on 07/16/2018.  Pyelogram did not show any filling defect or abnormalities.  No hydronephrosis.  Ureteral orifice was not involved with tumor.  There is a large 5 cm posterior wall bladder tumor, bullous and sessile appearing.  Patient underwent TURBT.   Pathology: High-grade urothelial carcinoma, invasive into muscularis propria.  Lymphovascular invasion is present.  Carcinoma in situ is also  identified.  Focal squamous differentiation is noted, areas of invasive carcinoma display pleomorphic/sarcomatoid changes.  T2b  08/07/2018 CT without contrast negative.  2 subpleural right upper lobe nodule 2 to 3 mm likely benign. She also had baseline audiometry done.  08/04/2018 ddMVAC x 1 cycle, stopped due to intolerance and AKI.  Patient received 1 cycle of dd MVAC, not able to tolerate due to AKI. Patient then was referred to [facility] urology  09/22/2018 patient underwent a cystectomy, pathology pT3a N0 Mx.  11 lymph nodes were harvested and was all negative. Invasive urothelia carcinoma, high grade, with sarcomatoid features.   10/14/2018 patient was admitted due to pyelonephritis and a pelvic fluid collection.  Drain was placed. 10/23/2018 drain was removed. Patient has had difficulties getting to her appointments to [facility]. 02/09/2019, patient presented with abdominal pain. CT concerning for small bowel obstruction and increased size of right pelvic fluid collection concerning for cancer recurrence.  Right hydronephrosis to the level of pelvis and enlarged lymph nodes. Patient had JP drain placed with CT guidance to pelvic fluid collection and drained 400 cc amber fluid.  Culture was negative for growth of microorganisms and cytology was negative for malignancy.-JP drain was removed on the day of discharge. CT-guided core biopsy of pelvic lymph node adenopathy was attempted but not successful.  # Right lower extremity DVT, provoked by Transvenous biopsy  02/26/2019 transvenous biopsy by IR unsuccessful transvenous biopsy of the right pelvis mass. Post biopsy acute thrombus in the right external iliac and common femoral vein.  Patient was recommended to start anticoagulation with Xarelto however due to the co-pay, patient is not able to afford the medication. Anticoagulation regimen was switched to Eliquis 5 mg.  Patient reports that she has been taking  it once a day. 03/20/2019 PET showed  FDG avid tissue in the cystectomy bed, retroperitoneal and pelvic lymphadenopathy.  Right common iliac DVT.  # increased right lower extremity swelling.  She was started on Eliquis for anticoagulation by [facility].  We clarified with her pharmacy and she was actually taking Eliquis 2.5 mg twice daily. Right lower extremity swelling has not improved but instead worsened. 03/16/2019 She had ultrasound right lower extremity done which showed persistent extensive proximal right lower extremity DVT.  Anticoagulation regimen has increased to Eliquis 5 mg twice daily.  # establish care with [facility] oncology Dr. [name] for evaluation.  Dr. [name] recommended starting immunotherapy with PD-L1 inhibitor Pembrolizumab 200 mg every 3 weeks.  The sarcomatoid histology may not respond to chemotherapy well, could portend a better chance of response into immunotherapy PET scan after 4 cycles of Keytruda showed single mildly enlarged central mesenteric lymph node in the upper pelvis with SUV 9.6.  No other abnormal hypermetabolic activity was reported.  # 03/30/2019 Status post IVC filter placement, mechanical thrombectomy.-IVC filter was retrieved in January 2021. # PD-L1 CPS 100%.  # 03/31/2019 started on immunotherapy Keytruda.   INTERVAL HISTORY Angela Russell is a 69 y.o. female who has above history reviewed by me today presents for follow up visit for evaluation form metastatic high-grade urothelial carcinoma of the bladder. Sarcomatoid feature.  Patient has been doing well on immunotherapy with Keytruda.  Patient reports intermittent pain of her upper abdomen.  No exacerbating or alleviating factors. Patient has been on Eliquis 5 mg twice daily.  Status post IVC filter placement and mechanical thrombectomy.  IVC filter has been retrieved in January 2021. She continues to have intermittent dark-colored vaginal discharge.  She has a history of hysterectomy.  Has not seen gynecology recently.  Review of  Systems  Constitutional: Negative for appetite change, chills, fatigue and fever.  HENT:   Negative for hearing loss and voice change.   Eyes: Negative for eye problems.  Respiratory: Negative for chest tightness and cough.   Cardiovascular: Negative for chest pain and leg swelling.  Gastrointestinal: Positive for abdominal pain. Negative for abdominal distention and blood in stool.  Endocrine: Negative for hot flashes.  Genitourinary: Negative for difficulty urinating and frequency.   Musculoskeletal: Negative for arthralgias.  Skin: Negative for itching and rash.  Neurological: Negative for extremity weakness.  Hematological: Negative for adenopathy.  Psychiatric/Behavioral: Negative for confusion. The patient is not nervous/anxious.     MEDICAL HISTORY:  Past Medical History:  Diagnosis Date  . Carotid artery plaque, right 01/2014  . CKD (chronic kidney disease)    stage 2-3  . Diabetes mellitus without complication ([place])   . DM (diabetes mellitus), type 2, uncontrolled ([place])   . Hyperlipidemia   . Hypertension   . Hypochromic microcytic anemia    mild  . Metastatic urothelial carcinoma ([place]) 03/23/2019  . Osteoporosis   . Renal insufficiency   . Rotator cuff tendonitis, right     SURGICAL HISTORY: Past Surgical History:  Procedure Laterality Date  . ABDOMINAL HYSTERECTOMY  2000   due to bleeding and fibroids, partial- still has ovaries  . CYSTOSCOPY W/ RETROGRADES Bilateral 07/16/2018   Procedure: CYSTOSCOPY WITH RETROGRADE PYELOGRAM;  Surgeon: Billey Co, MD;  Location: [facility] ORS;  Service: Urology;  Laterality: Bilateral;  . IVC FILTER INSERTION N/A 03/30/2019   Procedure: IVC FILTER INSERTION;  Surgeon: Algernon Huxley, MD;  Location: [place] [facility];  Service: Cardiovascular;  Laterality: N/A;  .  IVC FILTER REMOVAL N/A 06/15/2019   Procedure: IVC FILTER REMOVAL;  Surgeon: Algernon Huxley, MD;  Location: [place] CV LAB;  Service: Cardiovascular;  Laterality:  N/A;  . KNEE SURGERY Left 03/17/2013   torn meniscus  . PERIPHERAL VASCULAR THROMBECTOMY Right 03/30/2019   Procedure: PERIPHERAL VASCULAR THROMBECTOMY;  Surgeon: Algernon Huxley, MD;  Location: [place] [facility];  Service: Cardiovascular;  Laterality: Right;  . PORTA CATH INSERTION N/A 08/06/2018   Procedure: PORTA CATH INSERTION;  Surgeon: Algernon Huxley, MD;  Location: [place] [facility] LAB;  Service: Cardiovascular;  Laterality: N/A;  . TRANSURETHRAL RESECTION OF BLADDER TUMOR N/A 07/16/2018   Procedure: TRANSURETHRAL RESECTION OF BLADDER TUMOR (TURBT);  Surgeon: Billey Co, MD;  Location: [facility] ORS;  Service: Urology;  Laterality: N/A;    SOCIAL HISTORY: Social History   Socioeconomic History  . Marital status: Single    Spouse name: Not on file  . Number of children: 1  . Years of education: Not on file  . Highest education level: 10th grade  Occupational History  . Not on file  Tobacco Use  . Smoking status: Former Smoker    Quit date: 06/05/1991    Years since quitting: 28.1  . Smokeless tobacco: Never Used  Substance and Sexual Activity  . Alcohol use: No  . Drug use: No  . Sexual activity: Never  Other Topics Concern  . Not on file  Social History Narrative   Working full time   Social Determinants of Radio broadcast assistant Strain:   . Difficulty of Paying Living Expenses: Not on file  Food Insecurity:   . Worried About Charity fundraiser in the Last Year: Not on file  . Ran Out of Food in the Last Year: Not on file  Transportation Needs:   . Lack of Transportation (Medical): Not on file  . Lack of Transportation (Non-Medical): Not on file  Physical Activity:   . Days of Exercise per Week: Not on file  . Minutes of Exercise per Session: Not on file  Stress:   . Feeling of Stress : Not on file  Social Connections:   . Frequency of Communication with Friends and Family: Not on file  . Frequency of Social Gatherings with Friends and Family: Not on file    . Attends Religious Services: Not on file  . Active Member of Clubs or Organizations: Not on file  . Attends Archivist Meetings: Not on file  . Marital Status: Not on file  Intimate Partner Violence:   . Fear of Current or Ex-Partner: Not on file  . Emotionally Abused: Not on file  . Physically Abused: Not on file  . Sexually Abused: Not on file    FAMILY HISTORY: Family History  Problem Relation Age of Onset  . Heart disease Mother   . Heart attack Mother   . Arthritis Father   . Diabetes Brother     ALLERGIES:  has No Known Allergies.  MEDICATIONS:  Current Outpatient Medications  Medication Sig Dispense Refill  . amLODipine (NORVASC) 5 MG tablet Take 1 tablet (5 mg total) by mouth daily. 90 tablet 1  . apixaban (ELIQUIS) 5 MG TABS tablet Take 1 tablet (5 mg total) by mouth 2 (two) times daily. 60 tablet 3  . diclofenac sodium (VOLTAREN) 1 % GEL Apply 2 g topically 4 (four) times daily. 100 g 2  . lidocaine-prilocaine (EMLA) cream Apply to affected area once 30 g 3  .  polyethylene glycol (MIRALAX / GLYCOLAX) 17 g packet Take 17 g by mouth daily as needed for mild constipation. 14 each 0  . rosuvastatin (CRESTOR) 20 MG tablet Take 1 tablet (20 mg total) by mouth daily. 90 tablet 1   No current facility-administered medications for this visit.     PHYSICAL EXAMINATION: ECOG PERFORMANCE STATUS: 0 - Asymptomatic Vitals:   08/04/19 0930 08/04/19 0931  BP: (!) 193/80 (!) 165/80  Pulse: 89 79  Temp: (!) 97.3 F (36.3 C)    Filed Weights   08/04/19 0930  Weight: 196 lb 12.8 oz (89.3 kg)    Physical Exam Constitutional:      General: She is not in acute distress.    Comments: Walk independently  HENT:     Head: Normocephalic and atraumatic.  Eyes:     General: No scleral icterus.    Pupils: Pupils are equal, round, and reactive to light.  Cardiovascular:     Rate and Rhythm: Normal rate and regular rhythm.     Heart sounds: Normal heart sounds.   Pulmonary:     Effort: Pulmonary effort is normal. No respiratory distress.     Breath sounds: No wheezing.     Comments: Palpable left anterior rib tenderness. Abdominal:     General: Bowel sounds are normal. There is no distension.     Palpations: Abdomen is soft. There is no mass.     Tenderness: There is no abdominal tenderness.  Musculoskeletal:        General: No deformity. Normal range of motion.     Cervical back: Normal range of motion and neck supple.     Comments: Trace right lower extremity edema  Skin:    General: Skin is warm and dry.     Coloration: Skin is not pale.     Findings: No erythema or rash.  Neurological:     Mental Status: She is alert and oriented to person, place, and time. Mental status is at baseline.     Cranial Nerves: No cranial nerve deficit.     Coordination: Coordination normal.  Psychiatric:        Mood and Affect: Mood normal.        Behavior: Behavior normal.        Thought Content: Thought content normal.     RADIOGRAPHIC STUDIES: I have personally reviewed the radiological images as listed and agreed with the findings in the report.  CMP Latest Ref Rng & Units 08/04/2019  Glucose 70 - 99 mg/dL 158(H)  BUN 8 - 23 mg/dL 27(H)  Creatinine 0.44 - 1.00 mg/dL 1.50(H)  Sodium 135 - 145 mmol/L 140  Potassium 3.5 - 5.1 mmol/L 4.2  Chloride 98 - 111 mmol/L 107  CO2 22 - 32 mmol/L 23  Calcium 8.9 - 10.3 mg/dL 9.0  Total Protein 6.5 - 8.1 g/dL 7.4  Total Bilirubin 0.3 - 1.2 mg/dL 0.6  Alkaline Phos 38 - 126 U/L 96  AST 15 - 41 U/L 18  ALT 0 - 44 U/L 10   CBC Latest Ref Rng & Units 08/04/2019  WBC 4.0 - 10.5 K/uL 4.9  Hemoglobin 12.0 - 15.0 g/dL 9.7(L)  Hematocrit 36.0 - 46.0 % 31.8(L)  Platelets 150 - 400 K/uL 213    LABORATORY DATA:  I have reviewed the data as listed Lab Results  Component Value Date   WBC 4.9 08/04/2019   HGB 9.7 (L) 08/04/2019   HCT 31.8 (L) 08/04/2019   MCV 78.9 (L) 08/04/2019  PLT 213 08/04/2019   Recent  Labs    12/19/18 1841 12/20/18 0004 06/23/19 0920 07/14/19 0908 08/04/19 0913  NA 137   < > 140 140 140  K 6.3*   < > 4.2 4.1 4.2  CL 115*   < > 108 110 107  CO2 15*   < > 23 21* 23  GLUCOSE 129*   < > 127* 134* 158*  BUN 37*   < > 30* 37* 27*  CREATININE 2.03*   < > 1.42* 1.56* 1.50*  CALCIUM 9.7   < > 9.2 9.2 9.0  GFRNONAA 24*   < > 38* 34* 35*  GFRAA 28*   < > 44* 39* 41*  PROT  --    < > 7.7 7.8 7.4  ALBUMIN  --    < > 4.0 4.0 3.8  AST  --    < > _0 ALT  --    < > _1 ALKPHOS  --    < > 98 103 96  BILITOT 0.5   < > 0.4 0.5 0.6  BILIDIR <0.1  --   --   --   --   IBILI NOT CALCULATED  --   --   --   --    < > = values in this interval not displayed.   Iron/TIBC/Ferritin/ %Sat    Component Value Date/Time   IRON 46 03/04/2019 1423   IRON 26 (L) 02/04/2019 1309   TIBC 267 03/04/2019 1423   TIBC 251 02/04/2019 1309   FERRITIN 316 (H) 03/04/2019 1423   FERRITIN 409 (H) 02/04/2019 1309   IRONPCTSAT 17 03/04/2019 1423   IRONPCTSAT 10 (L) 02/04/2019 1309    RADIOGRAPHIC STUDIES: I have personally reviewed the radiological images as listed and agreed with the findings in the report. PERIPHERAL VASCULAR CATHETERIZATION  Result Date: 06/15/2019 See op note  NM PET Image Restag (PS) Skull Base To Thigh  Result Date: 06/18/2019 CLINICAL DATA:  Subsequent treatment strategy for metastatic urothelial carcinoma. EXAM: NUCLEAR MEDICINE PET SKULL BASE TO THIGH TECHNIQUE: 10.3 mCi F-18 FDG was injected intravenously. Full-ring PET imaging was performed from the skull base to thigh after the radiotracer. CT data was obtained and used for attenuation correction and anatomic localization. Fasting blood glucose: 127 mg/dl COMPARISON:  Multiple exams, including CT examinations from 10/13/2018 and 08/07/2018 FINDINGS: Mediastinal blood pool activity: SUV max 2.8 Liver activity: SUV max NA NECK: Incidental physiologic muscular activity. Incidental CT findings: Atherosclerotic  calcification of the left common carotid artery. CHEST: The tiny right upper lobe nodules seen on the CT from 08/07/2018 are not changed compared to the prior exam. These lesions are not hypermetabolic but are below sensitive PET-CT size thresholds, and measure in the 2-3 mm diameter range. Incidental CT findings: Right Port-A-Cath tip: Right atrium. Right subcoracoid bursitis. Aberrant right subclavian artery passes behind the esophagus. Coronary, aortic arch, and branch vessel atherosclerotic vascular disease. ABDOMEN/PELVIS: A hypermetabolic lymph node in the upper pelvic mesentery slightly eccentric to the right measures 1.1 cm in short axis on image 184/3, and has a maximum SUV of 9.6, highly suspicious for malignancy. Excreted FDG noted in the ileal loop. Incidental CT findings: Peristomal herniation of small bowel loop without strangulation or obstruction. Prominent stool throughout the colon favors constipation. Aortoiliac atherosclerotic vascular disease. No current hydronephrosis. Right iliac vein stent. Persistent likely loculated fluid collections in the pelvis and along the left pelvic sidewall. SKELETON: No significant abnormal hypermetabolic  activity in this region. Incidental CT findings: Degenerative arthropathy of both hips. IMPRESSION: 1. A single mildly enlarged central mesenteric lymph node in the upper pelvis has a maximum SUV of 9.6, suspicious for recurrent malignancy. 2. Peristomal herniation of a small bowel loop without strangulation or obstruction. 3. Other imaging findings of potential clinical significance: Aortic Atherosclerosis (ICD10-I70.0). Coronary atherosclerosis. Aberrant right subclavian artery. Right subcoracoid bursitis. Prominent stool throughout the colon favors constipation. Persistent loculated fluid collections in the pelvis and along the left pelvic sidewall (query postoperative seromas). Electronically Signed   By: Van Clines M.D.   On: 06/18/2019 12:57   DVT  (Right)  Result Date: 06/09/2019  Lower Venous Study Other Indications: Chronic DVT right CFV. Risk Factors: Surgery Rt CFV thrombectomy 03/30/2019. Comparison Study: 03/27/2019 Performing Technologist: Concha Norway RVT  Examination Guidelines: A complete evaluation includes B-mode imaging, spectral Doppler, color Doppler, and power Doppler as needed of all accessible portions of each vessel. Bilateral testing is considered an integral part of a complete examination. Limited examinations for reoccurring indications may be performed as noted.  +---------+---------------+---------+-----------+----------+--------------+ RIGHT    CompressibilityPhasicitySpontaneityPropertiesThrombus Aging +---------+---------------+---------+-----------+----------+--------------+ CFV      Full           Yes      Yes                                 +---------+---------------+---------+-----------+----------+--------------+ SFJ      Full           Yes      Yes                                 +---------+---------------+---------+-----------+----------+--------------+ FV Prox  Full                                                        +---------+---------------+---------+-----------+----------+--------------+ FV Mid   Full           Yes      Yes                                 +---------+---------------+---------+-----------+----------+--------------+ FV DistalFull                                                        +---------+---------------+---------+-----------+----------+--------------+ PFV      Full           Yes      Yes                                 +---------+---------------+---------+-----------+----------+--------------+ POP      Full           Yes      Yes                                 +---------+---------------+---------+-----------+----------+--------------+ PTV  Full           Yes      Yes                                  +---------+---------------+---------+-----------+----------+--------------+ PERO     Full           Yes      Yes                                 +---------+---------------+---------+-----------+----------+--------------+ Gastroc  Full           Yes      Yes                                 +---------+---------------+---------+-----------+----------+--------------+ GSV      Full           Yes      Yes                                 +---------+---------------+---------+-----------+----------+--------------+ SSV      Full           Yes                                          +---------+---------------+---------+-----------+----------+--------------+   +----+---------------+---------+-----------+----------+--------------+ LEFTCompressibilityPhasicitySpontaneityPropertiesThrombus Aging +----+---------------+---------+-----------+----------+--------------+ CFV Full           Yes      Yes                                 +----+---------------+---------+-----------+----------+--------------+ SFJ Full           Yes      Yes                                 +----+---------------+---------+-----------+----------+--------------+     Summary: Right: There is no evidence of deep vein thrombosis in the lower extremity.There is no evidence of superficial venous thrombosis. Left: No evidence of common femoral vein obstruction.  *See table(s) above for measurements and observations. Electronically signed by Leotis Pain MD on 06/09/2019 at 9:11:11 AM.    Final      ASSESSMENT & PLAN:  1. Metastatic urothelial carcinoma ([place])   2. Anemia in stage 3a chronic kidney disease   3. Encounter for antineoplastic immunotherapy   4. History of DVT of lower extremity   5. Chronic anticoagulation   6. Rib pain on left side   Cancer Staging Bladder carcinoma [facility]) Staging form: Urinary Bladder, AJCC 8th Edition - Clinical stage from 08/01/2018: Stage IVA (cTX, cN3, cM1a) - Signed by [name], MD on 04/21/2019  #Metastatic urothelial carcinoma of bladder, sarcomatoid features pelvic sidewall mass showed metastatic carcinoma consistent with involvement by urothelial carcinoma. Patient has been on Keytruda  every 3 weeks and overall tolerates well.  PET scan after 4 cycles of Keytruda showed good treatment response.   Labs reviewed and discussed with patient.  Counts acceptable to proceed with Keytruda today.  #Weight gain, TSH has been recently monitored and  has been normal.  Discussed with patient about exercise and healthy diet.  #Right lower extremity  femoral vein DVT secondary to transvenous biopsy in the context of cancer recurrence Status post IV C filter and mechanical thrombectomy.  IVC filter has been removed. Continue Eliquis 5 mg twice daily. Continue compression stocking   #Bloody vaginal discharge, she has not scheduled appointment with GYN yet.  I discussed with patient again and recommend patient to see her gynecologist and have a pelvic examination.  Will refer to [place]. #Anemia in CKD.Marland Kitchen  Hemoglobin 9.7, stable.  Continue to monitor. Erythropoietin therapy is contraindicated due to active cancer.  #Left rib cage pain, her upper abdominal pain is reproducible with on her left rib cage.  Questionable costochondritis versus musculoskeletal.  Recommend patient to continue monitor.  She may take Tylenol for pain.   All questions were answered. The patient knows to call the clinic with any problems questions or concerns.  Return of visit: 3 weeks.    [name], MD, PhD Hematology Oncology [facility] at [facility] Pager- [phone] 08/05/19

## 2019-08-25 ENCOUNTER — Inpatient Hospital Stay: Payer: Medicare Other

## 2019-08-25 ENCOUNTER — Encounter: Payer: Self-pay | Admitting: Oncology

## 2019-08-25 ENCOUNTER — Inpatient Hospital Stay: Payer: Medicare Other | Attending: Oncology

## 2019-08-25 ENCOUNTER — Inpatient Hospital Stay (HOSPITAL_BASED_OUTPATIENT_CLINIC_OR_DEPARTMENT_OTHER): Payer: Medicare Other | Admitting: Oncology

## 2019-08-25 ENCOUNTER — Other Ambulatory Visit: Payer: Self-pay

## 2019-08-25 VITALS — BP 152/82 | HR 79 | Temp 95.2°F | Resp 16 | Wt 197.4 lb

## 2019-08-25 DIAGNOSIS — C7910 Secondary malignant neoplasm of unspecified urinary organs: Secondary | ICD-10-CM

## 2019-08-25 DIAGNOSIS — E785 Hyperlipidemia, unspecified: Secondary | ICD-10-CM | POA: Diagnosis not present

## 2019-08-25 DIAGNOSIS — Z5112 Encounter for antineoplastic immunotherapy: Secondary | ICD-10-CM | POA: Insufficient documentation

## 2019-08-25 DIAGNOSIS — M810 Age-related osteoporosis without current pathological fracture: Secondary | ICD-10-CM | POA: Diagnosis not present

## 2019-08-25 DIAGNOSIS — R202 Paresthesia of skin: Secondary | ICD-10-CM

## 2019-08-25 DIAGNOSIS — Z87891 Personal history of nicotine dependence: Secondary | ICD-10-CM | POA: Diagnosis not present

## 2019-08-25 DIAGNOSIS — Z79899 Other long term (current) drug therapy: Secondary | ICD-10-CM | POA: Diagnosis not present

## 2019-08-25 DIAGNOSIS — M79631 Pain in right forearm: Secondary | ICD-10-CM | POA: Diagnosis not present

## 2019-08-25 DIAGNOSIS — E1165 Type 2 diabetes mellitus with hyperglycemia: Secondary | ICD-10-CM | POA: Diagnosis not present

## 2019-08-25 DIAGNOSIS — R200 Anesthesia of skin: Secondary | ICD-10-CM

## 2019-08-25 DIAGNOSIS — N898 Other specified noninflammatory disorders of vagina: Secondary | ICD-10-CM | POA: Diagnosis not present

## 2019-08-25 DIAGNOSIS — I129 Hypertensive chronic kidney disease with stage 1 through stage 4 chronic kidney disease, or unspecified chronic kidney disease: Secondary | ICD-10-CM | POA: Diagnosis not present

## 2019-08-25 DIAGNOSIS — C689 Malignant neoplasm of urinary organ, unspecified: Secondary | ICD-10-CM | POA: Diagnosis not present

## 2019-08-25 DIAGNOSIS — D649 Anemia, unspecified: Secondary | ICD-10-CM | POA: Diagnosis not present

## 2019-08-25 DIAGNOSIS — R1010 Upper abdominal pain, unspecified: Secondary | ICD-10-CM | POA: Diagnosis not present

## 2019-08-25 DIAGNOSIS — Z7901 Long term (current) use of anticoagulants: Secondary | ICD-10-CM

## 2019-08-25 DIAGNOSIS — Z90710 Acquired absence of both cervix and uterus: Secondary | ICD-10-CM | POA: Diagnosis not present

## 2019-08-25 DIAGNOSIS — N183 Chronic kidney disease, stage 3 unspecified: Secondary | ICD-10-CM | POA: Diagnosis not present

## 2019-08-25 DIAGNOSIS — Z86718 Personal history of other venous thrombosis and embolism: Secondary | ICD-10-CM | POA: Diagnosis not present

## 2019-08-25 DIAGNOSIS — R109 Unspecified abdominal pain: Secondary | ICD-10-CM

## 2019-08-25 DIAGNOSIS — C679 Malignant neoplasm of bladder, unspecified: Secondary | ICD-10-CM | POA: Diagnosis not present

## 2019-08-25 DIAGNOSIS — R7989 Other specified abnormal findings of blood chemistry: Secondary | ICD-10-CM | POA: Diagnosis not present

## 2019-08-25 DIAGNOSIS — Z95828 Presence of other vascular implants and grafts: Secondary | ICD-10-CM

## 2019-08-25 DIAGNOSIS — R1033 Periumbilical pain: Secondary | ICD-10-CM | POA: Diagnosis not present

## 2019-08-25 LAB — COMPREHENSIVE METABOLIC PANEL
ALT: 11 U/L (ref 0–44)
AST: 15 U/L (ref 15–41)
Albumin: 4 g/dL (ref 3.5–5.0)
Alkaline Phosphatase: 98 U/L (ref 38–126)
Anion gap: 9 (ref 5–15)
BUN: 30 mg/dL — ABNORMAL HIGH (ref 8–23)
CO2: 22 mmol/L (ref 22–32)
Calcium: 9 mg/dL (ref 8.9–10.3)
Chloride: 108 mmol/L (ref 98–111)
Creatinine, Ser: 1.43 mg/dL — ABNORMAL HIGH (ref 0.44–1.00)
GFR calc Af Amer: 43 mL/min — ABNORMAL LOW (ref >60)
GFR calc non Af Amer: 37 mL/min — ABNORMAL LOW (ref >60)
Glucose, Bld: 149 mg/dL — ABNORMAL HIGH (ref 70–99)
Potassium: 3.9 mmol/L (ref 3.5–5.1)
Sodium: 139 mmol/L (ref 135–145)
Total Bilirubin: 0.7 mg/dL (ref 0.3–1.2)
Total Protein: 7.4 g/dL (ref 6.5–8.1)

## 2019-08-25 LAB — CBC WITH DIFFERENTIAL/PLATELET
Abs Immature Granulocytes: 0.02 10*3/uL (ref 0.00–0.07)
Basophils Absolute: 0 10*3/uL (ref 0.0–0.1)
Basophils Relative: 0 %
Eosinophils Absolute: 0.1 10*3/uL (ref 0.0–0.5)
Eosinophils Relative: 2 %
HCT: 31.3 % — ABNORMAL LOW (ref 36.0–46.0)
Hemoglobin: 9.9 g/dL — ABNORMAL LOW (ref 12.0–15.0)
Immature Granulocytes: 0 %
Lymphocytes Relative: 21 %
Lymphs Abs: 1 10*3/uL (ref 0.7–4.0)
MCH: 24.6 pg — ABNORMAL LOW (ref 26.0–34.0)
MCHC: 31.6 g/dL (ref 30.0–36.0)
MCV: 77.7 fL — ABNORMAL LOW (ref 80.0–100.0)
Monocytes Absolute: 0.4 10*3/uL (ref 0.1–1.0)
Monocytes Relative: 9 %
Neutro Abs: 3.1 10*3/uL (ref 1.7–7.7)
Neutrophils Relative %: 68 %
Platelets: 226 10*3/uL (ref 150–400)
RBC: 4.03 MIL/uL (ref 3.87–5.11)
RDW: 13.8 % (ref 11.5–15.5)
WBC: 4.7 10*3/uL (ref 4.0–10.5)
nRBC: 0 % (ref 0.0–0.2)

## 2019-08-25 MED ORDER — HEPARIN SOD (PORK) LOCK FLUSH 100 UNIT/ML IV SOLN
INTRAVENOUS | Status: AC
  Filled 2019-08-25: qty 5

## 2019-08-25 MED ORDER — SODIUM CHLORIDE 0.9 % IV SOLN
Freq: Once | INTRAVENOUS | Status: AC
  Filled 2019-08-25: qty 250

## 2019-08-25 MED ORDER — SODIUM CHLORIDE 0.9 % IV SOLN
200.0000 mg | Freq: Once | INTRAVENOUS | Status: AC
  Administered 2019-08-25: 200 mg via INTRAVENOUS
  Filled 2019-08-25: qty 8

## 2019-08-25 MED ORDER — HEPARIN SOD (PORK) LOCK FLUSH 100 UNIT/ML IV SOLN
500.0000 [IU] | Freq: Once | INTRAVENOUS | Status: AC | PRN
  Administered 2019-08-25: 500 [IU]
  Filled 2019-08-25: qty 5

## 2019-08-25 MED ORDER — SODIUM CHLORIDE 0.9% FLUSH
10.0000 mL | Freq: Once | INTRAVENOUS | Status: AC
  Administered 2019-08-25: 10 mL via INTRAVENOUS
  Filled 2019-08-25: qty 10

## 2019-08-25 NOTE — Progress Notes (Signed)
Hematology/Oncology follow up note [facility] [phone] Fax:([phone]) [phone]   Patient Care Team: Volney American, PA-C as PCP - General (Family Medicine) Idelle Leech, [place] (Optometry) [name], MD as Consulting Physician (Hematology and Oncology)  REFERRING PROVIDER: Dr.Sninsky CHIEF COMPLAINTS/REASON FOR VISIT:  Follow up for bladder cancer, anemia.   HISTORY OF PRESENTING ILLNESS:  Angela Russell is a  69 y.o.  female with PMH listed below who was referred to me for evaluation of newly diagnosed bladder cancer. Patient initially presented to emergency room at the end of January 2020 for evaluation of dysuria, hematuria and the left lower quadrant inguinal pain and flank pain.  1/28 2020 CT renal stone study showed suspected irregular wall thickening about the superior bladder, not well assessed due to degree of bladder distention.  Recommend cystoscopy for further evaluation.  No renal stone or obstructive uropathy.  Patient was given IV Rocephin and referred patient for outpatient urology follow-up.  Urine culture was negative.  She again presented to ER after 2 days with similar symptoms.  Patient has 25-pack-year smoking history, quit approximately 20 years ago.  No family history of any urology malignancies 07/03/2018 another CT abdomen pelvis with contrast was done which showed no nephrolithiasis or hydronephrosis is identified.  Bladder is decompressed limiting evaluation.  07/16/2018.urology Dr. [name] - cystoscopy and bilateral retrograde pyelogram on 07/16/2018.  Pyelogram did not show any filling defect or abnormalities.  No hydronephrosis.  Ureteral orifice was not involved with tumor.  There is a large 5 cm posterior wall bladder tumor, bullous and sessile appearing.  Patient underwent TURBT.   Pathology: High-grade urothelial carcinoma, invasive into muscularis propria.  Lymphovascular invasion is present.  Carcinoma in situ is also  identified.  Focal squamous differentiation is noted, areas of invasive carcinoma display pleomorphic/sarcomatoid changes.  T2b  08/07/2018 CT without contrast negative.  2 subpleural right upper lobe nodule 2 to 3 mm likely benign. She also had baseline audiometry done.  08/04/2018 ddMVAC x 1 cycle, stopped due to intolerance and AKI.  Patient received 1 cycle of dd MVAC, not able to tolerate due to AKI. Patient then was referred to [facility] urology  09/22/2018 patient underwent a cystectomy, pathology pT3a N0 Mx.  11 lymph nodes were harvested and was all negative. Invasive urothelia carcinoma, high grade, with sarcomatoid features.   10/14/2018 patient was admitted due to pyelonephritis and a pelvic fluid collection.  Drain was placed. 10/23/2018 drain was removed. Patient has had difficulties getting to her appointments to [facility]. 02/09/2019, patient presented with abdominal pain. CT concerning for small bowel obstruction and increased size of right pelvic fluid collection concerning for cancer recurrence.  Right hydronephrosis to the level of pelvis and enlarged lymph nodes. Patient had JP drain placed with CT guidance to pelvic fluid collection and drained 400 cc amber fluid.  Culture was negative for growth of microorganisms and cytology was negative for malignancy.-JP drain was removed on the day of discharge. CT-guided core biopsy of pelvic lymph node adenopathy was attempted but not successful.  # Right lower extremity DVT, provoked by Transvenous biopsy  02/26/2019 transvenous biopsy by IR unsuccessful transvenous biopsy of the right pelvis mass. Post biopsy acute thrombus in the right external iliac and common femoral vein.  Patient was recommended to start anticoagulation with Xarelto however due to the co-pay, patient is not able to afford the medication. Anticoagulation regimen was switched to Eliquis 5 mg.  Patient reports that she has been taking  it once a day. 03/20/2019 PET showed  FDG avid tissue in the cystectomy bed, retroperitoneal and pelvic lymphadenopathy.  Right common iliac DVT.  # increased right lower extremity swelling.  She was started on Eliquis for anticoagulation by [facility].  We clarified with her pharmacy and she was actually taking Eliquis 2.5 mg twice daily. Right lower extremity swelling has not improved but instead worsened. 03/16/2019 She had ultrasound right lower extremity done which showed persistent extensive proximal right lower extremity DVT.  Anticoagulation regimen has increased to Eliquis 5 mg twice daily.  # establish care with [facility] oncology Dr. [name] for evaluation.  Dr. [name] recommended starting immunotherapy with PD-L1 inhibitor Pembrolizumab 200 mg every 3 weeks.  The sarcomatoid histology may not respond to chemotherapy well, could portend a better chance of response into immunotherapy PET scan after 4 cycles of Keytruda showed single mildly enlarged central mesenteric lymph node in the upper pelvis with SUV 9.6.  No other abnormal hypermetabolic activity was reported.  # 03/30/2019 Status post IVC filter placement, mechanical thrombectomy.-IVC filter was retrieved in January 2021. # PD-L1 CPS 100%.  # 03/31/2019 started on immunotherapy Keytruda.   INTERVAL HISTORY Angela Russell is a 69 y.o. female who has above history reviewed by me today presents for follow up visit for evaluation form metastatic high-grade urothelial carcinoma of the bladder. Sarcomatoid feature.  Patient has been doing well on immunotherapy with Keytruda.  Patient reports constant left mid abdomen pain, dull in nature, worsening after eating. No alleviating factors. Also reports right forearm pain and tenderness, associated with right hand numbness sensation.  Denies any neck pain, decrease of extremity strength, back pain.  Denies any recent trauma or injury. Denies any fever or chills.  Review of Systems  Constitutional: Negative for appetite change,  chills, fatigue and fever.  HENT:   Negative for hearing loss and voice change.   Eyes: Negative for eye problems.  Respiratory: Negative for chest tightness and cough.   Cardiovascular: Negative for chest pain and leg swelling.  Gastrointestinal: Positive for abdominal pain. Negative for abdominal distention and blood in stool.  Endocrine: Negative for hot flashes.  Genitourinary: Negative for difficulty urinating and frequency.   Musculoskeletal: Negative for arthralgias.       Right forearm tenderness  Skin: Negative for itching and rash.  Neurological: Positive for numbness. Negative for extremity weakness.  Hematological: Negative for adenopathy.  Psychiatric/Behavioral: Negative for confusion. The patient is not nervous/anxious.     MEDICAL HISTORY:  Past Medical History:  Diagnosis Date  . Carotid artery plaque, right 01/2014  . CKD (chronic kidney disease)    stage 2-3  . Diabetes mellitus without complication ([place])   . DM (diabetes mellitus), type 2, uncontrolled ([place])   . Hyperlipidemia   . Hypertension   . Hypochromic microcytic anemia    mild  . Metastatic urothelial carcinoma ([place]) 03/23/2019  . Osteoporosis   . Renal insufficiency   . Rotator cuff tendonitis, right     SURGICAL HISTORY: Past Surgical History:  Procedure Laterality Date  . ABDOMINAL HYSTERECTOMY  2000   due to bleeding and fibroids, partial- still has ovaries  . CYSTOSCOPY W/ RETROGRADES Bilateral 07/16/2018   Procedure: CYSTOSCOPY WITH RETROGRADE PYELOGRAM;  Surgeon: Billey Co, MD;  Location: [facility] ORS;  Service: Urology;  Laterality: Bilateral;  . IVC FILTER INSERTION N/A 03/30/2019   Procedure: IVC FILTER INSERTION;  Surgeon: Algernon Huxley, MD;  Location: [place] [facility];  Service: Cardiovascular;  Laterality:  N/A;  . IVC FILTER REMOVAL N/A 06/15/2019   Procedure: IVC FILTER REMOVAL;  Surgeon: Algernon Huxley, MD;  Location: [place] [facility];  Service: Cardiovascular;  Laterality:  N/A;  . KNEE SURGERY Left 03/17/2013   torn meniscus  . PERIPHERAL VASCULAR THROMBECTOMY Right 03/30/2019   Procedure: PERIPHERAL VASCULAR THROMBECTOMY;  Surgeon: Algernon Huxley, MD;  Location: [place] [facility];  Service: Cardiovascular;  Laterality: Right;  . PORTA CATH INSERTION N/A 08/06/2018   Procedure: PORTA CATH INSERTION;  Surgeon: Algernon Huxley, MD;  Location: [place] [facility];  Service: Cardiovascular;  Laterality: N/A;  . TRANSURETHRAL RESECTION OF BLADDER TUMOR N/A 07/16/2018   Procedure: TRANSURETHRAL RESECTION OF BLADDER TUMOR (TURBT);  Surgeon: Billey Co, MD;  Location: [facility] ORS;  Service: Urology;  Laterality: N/A;    SOCIAL HISTORY: Social History   Socioeconomic History  . Marital status: Single    Spouse name: Not on file  . Number of children: 1  . Years of education: Not on file  . Highest education level: 10th grade  Occupational History  . Not on file  Tobacco Use  . Smoking status: Former Smoker    Quit date: 06/05/1991    Years since quitting: 28.2  . Smokeless tobacco: Never Used  Substance and Sexual Activity  . Alcohol use: No  . Drug use: No  . Sexual activity: Never  Other Topics Concern  . Not on file  Social History Narrative   Working full time   Social Determinants of Radio broadcast assistant Strain:   . Difficulty of Paying Living Expenses:   Food Insecurity:   . Worried About Charity fundraiser in the Last Year:   . Arboriculturist in the Last Year:   Transportation Needs:   . Film/video editor (Medical):   Marland Kitchen Lack of Transportation (Non-Medical):   Physical Activity:   . Days of Exercise per Week:   . Minutes of Exercise per Session:   Stress:   . Feeling of Stress :   Social Connections:   . Frequency of Communication with Friends and Family:   . Frequency of Social Gatherings with Friends and Family:   . Attends Religious Services:   . Active Member of Clubs or Organizations:   . Attends Archivist  Meetings:   Marland Kitchen Marital Status:   Intimate Partner Violence:   . Fear of Current or Ex-Partner:   . Emotionally Abused:   Marland Kitchen Physically Abused:   . Sexually Abused:     FAMILY HISTORY: Family History  Problem Relation Age of Onset  . Heart disease Mother   . Heart attack Mother   . Arthritis Father   . Diabetes Brother     ALLERGIES:  has No Known Allergies.  MEDICATIONS:  Current Outpatient Medications  Medication Sig Dispense Refill  . amLODipine (NORVASC) 5 MG tablet Take 1 tablet (5 mg total) by mouth daily. 90 tablet 1  . apixaban (ELIQUIS) 5 MG TABS tablet Take 1 tablet (5 mg total) by mouth 2 (two) times daily. 60 tablet 3  . diclofenac sodium (VOLTAREN) 1 % GEL Apply 2 g topically 4 (four) times daily. 100 g 2  . lidocaine-prilocaine (EMLA) cream Apply to affected area once 30 g 3  . polyethylene glycol (MIRALAX / GLYCOLAX) 17 g packet Take 17 g by mouth daily as needed for mild constipation. 14 each 0  . rosuvastatin (CRESTOR) 20 MG tablet Take 1 tablet (20  mg total) by mouth daily. 90 tablet 1   No current facility-administered medications for this visit.     PHYSICAL EXAMINATION: ECOG PERFORMANCE STATUS: 1 - Symptomatic but completely ambulatory Vitals:   08/25/19 0932  BP: (!) 152/82  Pulse: 79  Resp: 16  Temp: (!) 95.2 F (35.1 C)   Filed Weights   08/25/19 0932  Weight: 197 lb 6.4 oz (89.5 kg)    Physical Exam Constitutional:      General: She is not in acute distress.    Comments: Walk independently  HENT:     Head: Normocephalic and atraumatic.  Eyes:     General: No scleral icterus.    Pupils: Pupils are equal, round, and reactive to light.  Cardiovascular:     Rate and Rhythm: Normal rate and regular rhythm.     Heart sounds: Normal heart sounds.  Pulmonary:     Effort: Pulmonary effort is normal. No respiratory distress.     Breath sounds: No wheezing.  Abdominal:     General: Bowel sounds are normal. There is no distension.      Palpations: Abdomen is soft. There is no mass.     Comments: Left periumbilical tenderness, no rebound or guarding.  Musculoskeletal:        General: No deformity. Normal range of motion.     Cervical back: Normal range of motion and neck supple.     Comments: Trace right lower extremity edema Right forearm tenderness.  Skin:    General: Skin is warm and dry.     Coloration: Skin is not pale.     Findings: No erythema or rash.  Neurological:     Mental Status: She is alert and oriented to person, place, and time. Mental status is at baseline.     Cranial Nerves: No cranial nerve deficit.     Coordination: Coordination normal.  Psychiatric:        Mood and Affect: Mood normal.     RADIOGRAPHIC STUDIES: I have personally reviewed the radiological images as listed and agreed with the findings in the report.  CMP Latest Ref Rng & Units 08/25/2019  Glucose 70 - 99 mg/dL 149(H)  BUN 8 - 23 mg/dL 30(H)  Creatinine 0.44 - 1.00 mg/dL 1.43(H)  Sodium 135 - 145 mmol/L 139  Potassium 3.5 - 5.1 mmol/L 3.9  Chloride 98 - 111 mmol/L 108  CO2 22 - 32 mmol/L 22  Calcium 8.9 - 10.3 mg/dL 9.0  Total Protein 6.5 - 8.1 g/dL 7.4  Total Bilirubin 0.3 - 1.2 mg/dL 0.7  Alkaline Phos 38 - 126 U/L 98  AST 15 - 41 U/L 15  ALT 0 - 44 U/L 11   CBC Latest Ref Rng & Units 08/25/2019  WBC 4.0 - 10.5 K/uL 4.7  Hemoglobin 12.0 - 15.0 g/dL 9.9(L)  Hematocrit 36.0 - 46.0 % 31.3(L)  Platelets 150 - 400 K/uL 226    LABORATORY DATA:  I have reviewed the data as listed Lab Results  Component Value Date   WBC 4.7 08/25/2019   HGB 9.9 (L) 08/25/2019   HCT 31.3 (L) 08/25/2019   MCV 77.7 (L) 08/25/2019   PLT 226 08/25/2019   Recent Labs    12/19/18 1841 12/20/18 0004 07/14/19 0908 08/04/19 0913 08/25/19 0923  NA 137   < > 140 140 139  K 6.3*   < > 4.1 4.2 3.9  CL 115*   < > 110 107 108  CO2 15*   < > 21*  23 22  GLUCOSE 129*   < > 134* 158* 149*  BUN 37*   < > 37* 27* 30*  CREATININE 2.03*   < >  1.56* 1.50* 1.43*  CALCIUM 9.7   < > 9.2 9.0 9.0  GFRNONAA 24*   < > 34* 35* 37*  GFRAA 28*   < > 39* 41* 43*  PROT  --    < > 7.8 7.4 7.4  ALBUMIN  --    < > 4.0 3.8 4.0  AST  --    < > _0 ALT  --    < > _1 ALKPHOS  --    < > 103 96 98  BILITOT 0.5   < > 0.5 0.6 0.7  BILIDIR <0.1  --   --   --   --   IBILI NOT CALCULATED  --   --   --   --    < > = values in this interval not displayed.   Iron/TIBC/Ferritin/ %Sat    Component Value Date/Time   IRON 46 03/04/2019 1423   IRON 26 (L) 02/04/2019 1309   TIBC 267 03/04/2019 1423   TIBC 251 02/04/2019 1309   FERRITIN 316 (H) 03/04/2019 1423   FERRITIN 409 (H) 02/04/2019 1309   IRONPCTSAT 17 03/04/2019 1423   IRONPCTSAT 10 (L) 02/04/2019 1309    RADIOGRAPHIC STUDIES: I have personally reviewed the radiological images as listed and agreed with the findings in the report. PERIPHERAL VASCULAR CATHETERIZATION  Result Date: 06/15/2019 See op note  NM PET Image Restag (PS) Skull Base To Thigh  Result Date: 06/18/2019 CLINICAL DATA:  Subsequent treatment strategy for metastatic urothelial carcinoma. EXAM: NUCLEAR MEDICINE PET SKULL BASE TO THIGH TECHNIQUE: 10.3 mCi F-18 FDG was injected intravenously. Full-ring PET imaging was performed from the skull base to thigh after the radiotracer. CT data was obtained and used for attenuation correction and anatomic localization. Fasting blood glucose: 127 mg/dl COMPARISON:  Multiple exams, including CT examinations from 10/13/2018 and 08/07/2018 FINDINGS: Mediastinal blood pool activity: SUV max 2.8 Liver activity: SUV max NA NECK: Incidental physiologic muscular activity. Incidental CT findings: Atherosclerotic calcification of the left common carotid artery. CHEST: The tiny right upper lobe nodules seen on the CT from 08/07/2018 are not changed compared to the prior exam. These lesions are not hypermetabolic but are below sensitive PET-CT size thresholds, and measure in the 2-3 mm diameter  range. Incidental CT findings: Right Port-A-Cath tip: Right atrium. Right subcoracoid bursitis. Aberrant right subclavian artery passes behind the esophagus. Coronary, aortic arch, and branch vessel atherosclerotic vascular disease. ABDOMEN/PELVIS: A hypermetabolic lymph node in the upper pelvic mesentery slightly eccentric to the right measures 1.1 cm in short axis on image 184/3, and has a maximum SUV of 9.6, highly suspicious for malignancy. Excreted FDG noted in the ileal loop. Incidental CT findings: Peristomal herniation of small bowel loop without strangulation or obstruction. Prominent stool throughout the colon favors constipation. Aortoiliac atherosclerotic vascular disease. No current hydronephrosis. Right iliac vein stent. Persistent likely loculated fluid collections in the pelvis and along the left pelvic sidewall. SKELETON: No significant abnormal hypermetabolic activity in this region. Incidental CT findings: Degenerative arthropathy of both hips. IMPRESSION: 1. A single mildly enlarged central mesenteric lymph node in the upper pelvis has a maximum SUV of 9.6, suspicious for recurrent malignancy. 2. Peristomal herniation of a small bowel loop without strangulation or obstruction. 3. Other imaging findings of potential clinical significance: Aortic Atherosclerosis (  ICD10-I70.0). Coronary atherosclerosis. Aberrant right subclavian artery. Right subcoracoid bursitis. Prominent stool throughout the colon favors constipation. Persistent loculated fluid collections in the pelvis and along the left pelvic sidewall (query postoperative seromas). Electronically Signed   By: Van Clines M.D.   On: 06/18/2019 12:57   DVT (Right)  Result Date: 06/09/2019  Lower Venous Study Other Indications: Chronic DVT right CFV. Risk Factors: Surgery Rt CFV thrombectomy 03/30/2019. Comparison Study: 03/27/2019 Performing Technologist: Concha Norway RVT  Examination Guidelines: A complete evaluation includes B-mode  imaging, spectral Doppler, color Doppler, and power Doppler as needed of all accessible portions of each vessel. Bilateral testing is considered an integral part of a complete examination. Limited examinations for reoccurring indications may be performed as noted.  +---------+---------------+---------+-----------+----------+--------------+ RIGHT    CompressibilityPhasicitySpontaneityPropertiesThrombus Aging +---------+---------------+---------+-----------+----------+--------------+ CFV      Full           Yes      Yes                                 +---------+---------------+---------+-----------+----------+--------------+ SFJ      Full           Yes      Yes                                 +---------+---------------+---------+-----------+----------+--------------+ FV Prox  Full                                                        +---------+---------------+---------+-----------+----------+--------------+ FV Mid   Full           Yes      Yes                                 +---------+---------------+---------+-----------+----------+--------------+ FV DistalFull                                                        +---------+---------------+---------+-----------+----------+--------------+ PFV      Full           Yes      Yes                                 +---------+---------------+---------+-----------+----------+--------------+ POP      Full           Yes      Yes                                 +---------+---------------+---------+-----------+----------+--------------+ PTV      Full           Yes      Yes                                 +---------+---------------+---------+-----------+----------+--------------+ PERO     Full  Yes      Yes                                 +---------+---------------+---------+-----------+----------+--------------+ Gastroc  Full           Yes      Yes                                  +---------+---------------+---------+-----------+----------+--------------+ GSV      Full           Yes      Yes                                 +---------+---------------+---------+-----------+----------+--------------+ SSV      Full           Yes                                          +---------+---------------+---------+-----------+----------+--------------+   +----+---------------+---------+-----------+----------+--------------+ LEFTCompressibilityPhasicitySpontaneityPropertiesThrombus Aging +----+---------------+---------+-----------+----------+--------------+ CFV Full           Yes      Yes                                 +----+---------------+---------+-----------+----------+--------------+ SFJ Full           Yes      Yes                                 +----+---------------+---------+-----------+----------+--------------+     Summary: Right: There is no evidence of deep vein thrombosis in the lower extremity.There is no evidence of superficial venous thrombosis. Left: No evidence of common femoral vein obstruction.  *See table(s) above for measurements and observations. Electronically signed by Leotis Pain MD on 06/09/2019 at 9:11:11 AM.    Final      ASSESSMENT & PLAN:  1. Metastatic urothelial carcinoma ([place])   2. Encounter for antineoplastic immunotherapy   3. Chronic anticoagulation   4. Abdominal pain, unspecified abdominal location   5. Numbness and tingling in right hand   Cancer Staging Bladder carcinoma [facility]) Staging form: Urinary Bladder, AJCC 8th Edition - Clinical stage from 08/01/2018: Stage IVA (cTX, cN3, cM1a) - Signed by [name], MD on 04/21/2019  #Metastatic urothelial carcinoma of bladder, sarcomatoid features pelvic sidewall mass showed metastatic carcinoma consistent with involvement by urothelial carcinoma. Patient has been on Keytruda  every 3 weeks and overall tolerates well.  PET scan after 4 cycles of Keytruda showed good treatment  response.   Continue Keytruda treatment today.  Labs reviewed and discussed with patient.  #Left periumbilical pain, etiology unknown. #Right forearm tenderness with right hand numbness sensation.  No prior injury.  Etiology unknown.  She does not have focal neurological deficit patient is due for image surveillance and obtain PET scan. Her new symptoms may or may not be associated with RCC.  #Weight gain, TSH has been recently monitored and has been normal.  Discussed with patient about exercise and healthy diet.  #Right lower extremity  femoral vein DVT secondary to transvenous biopsy in the context of cancer recurrence  Status post IV C filter and mechanical thrombectomy.  IVC filter has been removed. Continue Eliquis 5 mg twice daily.  #Bloody vaginal discharge, I have referred her to GYN. #Anemia in CKD.Marland Kitchen    Hemoglobin is 9.9.  Stable. Erythropoietin therapy is contraindicated due to active cancer. All questions were answered. The patient knows to call the clinic with any problems questions or concerns.  Return of visit: 3 weeks.    [name], MD, PhD Hematology Oncology [facility] Pager- [phone] 08/25/19

## 2019-08-25 NOTE — Progress Notes (Signed)
Patient reports right hand pain 9/10 pain today that feels like needles, numbness, with loss of griping for 3 weeks.  Also pain in right forearm muscle burning.  Left side stomach pain getting worse with 8/10 on pain scale today

## 2019-08-31 ENCOUNTER — Other Ambulatory Visit: Payer: Self-pay

## 2019-08-31 ENCOUNTER — Encounter: Payer: Self-pay | Admitting: Obstetrics and Gynecology

## 2019-08-31 ENCOUNTER — Ambulatory Visit (INDEPENDENT_AMBULATORY_CARE_PROVIDER_SITE_OTHER): Payer: Medicare Other | Admitting: Obstetrics and Gynecology

## 2019-08-31 VITALS — BP 160/88 | Ht 63.0 in | Wt 198.0 lb

## 2019-08-31 DIAGNOSIS — N898 Other specified noninflammatory disorders of vagina: Secondary | ICD-10-CM

## 2019-08-31 DIAGNOSIS — Z90711 Acquired absence of uterus with remaining cervical stump: Secondary | ICD-10-CM

## 2019-08-31 DIAGNOSIS — Z1231 Encounter for screening mammogram for malignant neoplasm of breast: Secondary | ICD-10-CM

## 2019-08-31 DIAGNOSIS — Z1211 Encounter for screening for malignant neoplasm of colon: Secondary | ICD-10-CM | POA: Diagnosis not present

## 2019-08-31 DIAGNOSIS — G5601 Carpal tunnel syndrome, right upper limb: Secondary | ICD-10-CM | POA: Diagnosis not present

## 2019-08-31 DIAGNOSIS — K625 Hemorrhage of anus and rectum: Secondary | ICD-10-CM | POA: Diagnosis not present

## 2019-08-31 NOTE — Progress Notes (Signed)
Patient ID: Angela Russell, female   DOB: [birth year], 69 y.o.   MRN: [id number]  Reason for Consult: Vaginitis (Darker color discharge x6 months atleast, red blood with BM, and discharge has an odor )   Referred by [name], MD  Subjective:     HPI:  Angela Russell is a 69 y.o. female she presents today with complaints of  Vaginal/rectal bleeding with bowel movements which has been occurring since December of 2020.  She reports that she has constipation and has a bowel movement about 1 time a week normally. She takes Miralax to help have the bowel movement. She sees bright red blood when she wipes and will have spots of blood on her underwear. She has been taking Elaquis for about 2 months.  Additionally she reports a dark brown vaginal discharge since her surgery for bladder cancer about 1 year ago. She denies vaginal itching. She endorses rectal itching. She reports that she has been receiving Keytruda immunological treatment for her bladder cancer. She also reports right hand numbness and tingling.   She reports that she had a hysterectomy in her 43s for fibroids, but that she still have her cervix and ovaries.  She has not seen an OB-GYN in over 10 years.   Gynecological History Menarche: 13 Menopause: unknown, hysterectomy in her 3s for uterine fibroids Describes periods as monthly, heavy bleeding lasting for 5 days.  Last pap smear: unknown, more than 10 years ago Last Mammogram: 2009- [facility] History of STDs: Denies Sexually Active: denies, not sexually active in 3 years. No history of pain with intercourse + uterine fibroids.  Denies history of uterine polyps, endometriosis or PCOS  Obstetrical History G1P1001 1967-vaginal delivery, full term, female infant, no complications during pregnancy  Past Medical History:  Diagnosis Date  . Carotid artery plaque, right 01/2014  . CKD (chronic kidney disease)    stage 2-3  . Diabetes mellitus without complication ([place])   .  DM (diabetes mellitus), type 2, uncontrolled ([place])   . Hyperlipidemia   . Hypertension   . Hypochromic microcytic anemia    mild  . Metastatic urothelial carcinoma ([place]) 03/23/2019  . Osteoporosis   . Renal insufficiency   . Rotator cuff tendonitis, right    Family History  Problem Relation Age of Onset  . Heart disease Mother   . Heart attack Mother   . Arthritis Father   . Diabetes Brother    Past Surgical History:  Procedure Laterality Date  . ABDOMINAL HYSTERECTOMY  2000   due to bleeding and fibroids, partial- still has ovaries  . CYSTOSCOPY W/ RETROGRADES Bilateral 07/16/2018   Procedure: CYSTOSCOPY WITH RETROGRADE PYELOGRAM;  Surgeon: Billey Co, MD;  Location: [facility];  Service: Urology;  Laterality: Bilateral;  . IVC FILTER INSERTION N/A 03/30/2019   Procedure: IVC FILTER INSERTION;  Surgeon: Algernon Huxley, MD;  Location: [facility];  Service: Cardiovascular;  Laterality: N/A;  . IVC FILTER REMOVAL N/A 06/15/2019   Procedure: IVC FILTER REMOVAL;  Surgeon: Algernon Huxley, MD;  Location: [place];  Service: Cardiovascular;  Laterality: N/A;  . KNEE SURGERY Left 03/17/2013   torn meniscus  . PERIPHERAL VASCULAR THROMBECTOMY Right 03/30/2019   Procedure: PERIPHERAL VASCULAR THROMBECTOMY;  Surgeon: Algernon Huxley, MD;  Location: [facility];  Service: Cardiovascular;  Laterality: Right;  . PORTA CATH INSERTION N/A 08/06/2018   Procedure: PORTA CATH INSERTION;  Surgeon: Algernon Huxley, MD;  Location: [place]  [facility];  Service: Cardiovascular;  Laterality: N/A;  . TRANSURETHRAL RESECTION OF BLADDER TUMOR N/A 07/16/2018   Procedure: TRANSURETHRAL RESECTION OF BLADDER TUMOR (TURBT);  Surgeon: Billey Co, MD;  Location: [facility];  Service: Urology;  Laterality: N/A;    Short Social History:  Social History   Tobacco Use  . Smoking status: Former Smoker    Quit date: 06/05/1991    Years since quitting: 28.2  . Smokeless tobacco: Never Used    Substance Use Topics  . Alcohol use: No    No Known Allergies  Current Outpatient Medications  Medication Sig Dispense Refill  . amLODipine (NORVASC) 5 MG tablet Take 1 tablet (5 mg total) by mouth daily. 90 tablet 1  . apixaban (ELIQUIS) 5 MG TABS tablet Take 1 tablet (5 mg total) by mouth 2 (two) times daily. 60 tablet 3  . diclofenac sodium (VOLTAREN) 1 % GEL Apply 2 g topically 4 (four) times daily. 100 g 2  . lidocaine-prilocaine (EMLA) cream Apply to affected area once 30 g 3  . polyethylene glycol (MIRALAX / GLYCOLAX) 17 g packet Take 17 g by mouth daily as needed for mild constipation. 14 each 0  . rosuvastatin (CRESTOR) 20 MG tablet Take 1 tablet (20 mg total) by mouth daily. 90 tablet 1   No current facility-administered medications for this visit.    Review of Systems  Constitutional: Negative for chills, fatigue, fever and unexpected weight change.  HENT: Negative for trouble swallowing.  Eyes: Negative for loss of vision.  Respiratory: Negative for cough, shortness of breath and wheezing.  Cardiovascular: Negative for chest pain, leg swelling, palpitations and syncope.  GI: Negative for abdominal pain, blood in stool, diarrhea, nausea and vomiting.  GU: Negative for difficulty urinating, dysuria, frequency and hematuria.  Musculoskeletal: Negative for back pain, leg pain and joint pain.  Skin: Negative for rash.  Neurological: Negative for dizziness, headaches, light-headedness, numbness and seizures.  Psychiatric: Negative for behavioral problem, confusion, depressed mood and sleep disturbance.        Objective:  Objective   Vitals:   08/31/19 1526  BP: (!) 150/90  Weight: 198 lb (89.8 kg)  Height: 5\' 3"  (1.6 m)   Body mass index is 35.07 kg/m.  Physical Exam Vitals and nursing note reviewed.  Constitutional:      Appearance: She is well-developed.  HENT:     Head: Normocephalic and atraumatic.  Eyes:     Pupils: Pupils are equal, round, and  reactive to light.  Cardiovascular:     Rate and Rhythm: Normal rate and regular rhythm.  Pulmonary:     Effort: Pulmonary effort is normal. No respiratory distress.  Genitourinary:    Rectum: Normal.     Comments: External: Normal appearing vulva. No lesions noted.  Speculum examination: Cervix no visualized. No blood in the vaginal vault. no discharge.   Bimanual examination: Uterus absent. Cervix not palpated, appears to be surgically absent as well.  No adnexal masses. No adnexal tenderness. Pelvis not fixed.   Exam somewhat limited due to patient discomfort.  External hemorrhoid at 12 o'clock, small, non-inflamed.  Normal rectal exam, no blood, no internal hemorrhoids, no masses. Skin:    General: Skin is warm and dry.  Neurological:     Mental Status: She is alert and oriented to person, place, and time.  Psychiatric:        Behavior: Behavior normal.        Thought Content: Thought content normal.  Judgment: Judgment normal.        Assessment/Plan:    69 yo G1P1001 1. Rectal bleeding- Normal rectal exam, no masses, no bleeding, small external hemorrhoid.  Will refer to gastroenterology for further evaluation. Patient reports last colonoscopy about 1 week ago.  2. Right hand numbness, possible carpal tunnel pain. Will refer to Orthopedics for further care.  3. Breast cancer screening- Mammogram ordered 4. No evidence of vaginal bleeding. 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 Cervix likely removed in the past. Normal exam. Recommended yearly pelvic exams to the patient to monitor her ovaries which remain. No further follow up needed from a GYN perspective. Nuswab sent to rule out vaginitis, although no evidence of this on today's exam.    More than 50 minutes were spent face to face with the patient in the room, reviewing her medical records and coordinating care for the patient.    Adrian Prows MD [facility] 08/31/2019 4:17  PM

## 2019-08-31 NOTE — Patient Instructions (Signed)
Rectal Bleeding  Rectal bleeding is when blood passes out of the anus. People with rectal bleeding may notice bright red blood in their underwear or in the toilet after having a bowel movement. They may also have dark red or black stools. Rectal bleeding is usually a sign that something is wrong. Many things can cause rectal bleeding, including:  Hemorrhoids. These are blood vessels in the anus or rectum that are larger than normal.  Fistulas. These are abnormal passages in the rectum and anus.  Anal fissures. This is a tear in the anus.  Diverticulosis. This is a condition in which pockets or sacs project from the bowel.  Proctitis and colitis. These are conditions in which the rectum, colon, or anus become inflamed.  Polyps. These are growths that can be cancerous (malignant) or non-cancerous (benign).  Part of the rectum sticking out from the anus (rectal prolapse).  Certain medicines.  Intestinal infections. Follow these instructions at home: Pay attention to any changes in your symptoms. Take these actions to help lessen bleeding and discomfort:  Eat a diet that is high in fiber. This will keep your stool soft, making it easier to pass stools without straining. Ask your health care provider what foods and drinks are high in fiber.  Drink enough fluid to keep your urine clear or pale yellow. This also helps to keep your stool soft.  Try taking a warm bath. This may help soothe any pain in your rectum.  Keep all follow-up visits as told by your health care provider. This is important. Get help right away if:  You have new or increased rectal bleeding.  You have black or dark red stools.  You vomit blood or something that looks like coffee grounds.  You have pain or tenderness in your abdomen.  You have a fever.  You feel weak.  You feel nauseous.  You faint.  You have severe pain in your rectum.  You cannot have a bowel movement. This information is not  intended to replace advice given to you by your health care provider. Make sure you discuss any questions you have with your health care provider. Document Revised: 01/12/2016 Document Reviewed: 07/17/2015 Elsevier Patient Education  [place] Syndrome  Carpal tunnel syndrome is a condition that causes pain in your hand and arm. The carpal tunnel is a narrow area located on the palm side of your wrist. Repeated wrist motion or certain diseases may cause swelling within the tunnel. This swelling pinches the main nerve in the wrist (median nerve). What are the causes? This condition may be caused by:  Repeated wrist motions.  Wrist injuries.  Arthritis.  A cyst or tumor in the carpal tunnel.  Fluid buildup during pregnancy. Sometimes the cause of this condition is not known. What increases the risk? The following factors may make you more likely to develop this condition:  Having a job, such as being a Research scientist (life sciences), that requires you to repeatedly move your wrist in the same motion.  Being a woman.  Having certain conditions, such as: ? Diabetes. ? Obesity. ? An underactive thyroid (hypothyroidism). ? Kidney failure. What are the signs or symptoms? Symptoms of this condition include:  A tingling feeling in your fingers, especially in your thumb, index, and middle fingers.  Tingling or numbness in your hand.  An aching feeling in your entire arm, especially when your wrist and elbow are bent for a long time.  Wrist pain that goes  up your arm to your shoulder.  Pain that goes down into your palm or fingers.  A weak feeling in your hands. You may have trouble grabbing and holding items. Your symptoms may feel worse during the night. How is this diagnosed? This condition is diagnosed with a medical history and physical exam. You may also have tests, including:  Electromyogram (EMG). This test measures electrical signals sent by your nerves  into the muscles.  Nerve conduction study. This test measures how well electrical signals pass through your nerves.  Imaging tests, such as X-rays, ultrasound, and MRI. These tests check for possible causes of your condition. How is this treated? This condition may be treated with:  Lifestyle changes. It is important to stop or change the activity that caused your condition.  Doing exercise and activities to strengthen your muscles and bones (physical therapy).  Learning how to use your hand again after diagnosis (occupational therapy).  Medicines for pain and inflammation. This may include medicine that is injected into your wrist.  A wrist splint.  Surgery. Follow these instructions at home: If you have a splint:  Wear the splint as told by your health care provider. Remove it only as told by your health care provider.  Loosen the splint if your fingers tingle, become numb, or turn cold and blue.  Keep the splint clean.  If the splint is not waterproof: ? Do not let it get wet. ? Cover it with a watertight covering when you take a bath or shower. Managing pain, stiffness, and swelling   If directed, put ice on the painful area: ? If you have a removable splint, remove it as told by your health care provider. ? Put ice in a plastic bag. ? Place a towel between your skin and the bag. ? Leave the ice on for 20 minutes, 2-3 times per day. General instructions  Take over-the-counter and prescription medicines only as told by your health care provider.  Rest your wrist from any activity that may be causing your pain. If your condition is work related, talk with your employer about changes that can be made, such as getting a wrist pad to use while typing.  Do any exercises as told by your health care provider, physical therapist, or occupational therapist.  Keep all follow-up visits as told by your health care provider. This is important. Contact a health care provider  if:  You have new symptoms.  Your pain is not controlled with medicines.  Your symptoms get worse. Get help right away if:  You have severe numbness or tingling in your wrist or hand. Summary  Carpal tunnel syndrome is a condition that causes pain in your hand and arm.  It is usually caused by repeated wrist motions.  Lifestyle changes and medicines are used to treat carpal tunnel syndrome. Surgery may be recommended.  Follow your health care provider's instructions about wearing a splint, resting from activity, keeping follow-up visits, and calling for help. This information is not intended to replace advice given to you by your health care provider. Make sure you discuss any questions you have with your health care provider. Document Revised: 09/27/2017 Document Reviewed: 09/27/2017 Elsevier Patient Education  [place].

## 2019-09-01 ENCOUNTER — Other Ambulatory Visit: Payer: Self-pay

## 2019-09-01 ENCOUNTER — Encounter
Admission: RE | Admit: 2019-09-01 | Discharge: 2019-09-01 | Disposition: A | Discharge status: Home / Self Care | Payer: Medicare Other | Source: Ambulatory Visit | Attending: Oncology | Admitting: Oncology

## 2019-09-01 DIAGNOSIS — C7910 Secondary malignant neoplasm of unspecified urinary organs: Secondary | ICD-10-CM | POA: Diagnosis not present

## 2019-09-01 DIAGNOSIS — I2510 Atherosclerotic heart disease of native coronary artery without angina pectoris: Secondary | ICD-10-CM | POA: Insufficient documentation

## 2019-09-01 DIAGNOSIS — I700 Atherosclerosis of aorta: Secondary | ICD-10-CM | POA: Insufficient documentation

## 2019-09-01 DIAGNOSIS — C679 Malignant neoplasm of bladder, unspecified: Secondary | ICD-10-CM | POA: Diagnosis not present

## 2019-09-01 DIAGNOSIS — C801 Malignant (primary) neoplasm, unspecified: Secondary | ICD-10-CM | POA: Diagnosis not present

## 2019-09-01 DIAGNOSIS — C7989 Secondary malignant neoplasm of other specified sites: Secondary | ICD-10-CM | POA: Insufficient documentation

## 2019-09-01 DIAGNOSIS — C7911 Secondary malignant neoplasm of bladder: Secondary | ICD-10-CM | POA: Insufficient documentation

## 2019-09-01 LAB — GLUCOSE, CAPILLARY: Glucose-Capillary: 153 mg/dL — ABNORMAL HIGH (ref 70–99)

## 2019-09-01 MED ORDER — FLUDEOXYGLUCOSE F - 18 (FDG) INJECTION
10.6000 | Freq: Once | INTRAVENOUS | Status: AC | PRN
  Administered 2019-09-01: 10.6 via INTRAVENOUS

## 2019-09-02 ENCOUNTER — Telehealth: Payer: Self-pay | Admitting: Obstetrics and Gynecology

## 2019-09-02 LAB — NUSWAB BV AND CANDIDA, NAA
Candida albicans, NAA: NEGATIVE
Candida glabrata, NAA: NEGATIVE

## 2019-09-02 NOTE — Progress Notes (Signed)
Called patient. No answer. Normal result. Mail box was full, could not leave message.

## 2019-09-02 NOTE — Telephone Encounter (Signed)
Patient is returning missed call possible for results. Please advise

## 2019-09-02 NOTE — Telephone Encounter (Signed)
Pt aware.

## 2019-09-02 NOTE — Telephone Encounter (Signed)
Please call and let her know nuswab was normal, no infection

## 2019-09-02 NOTE — Progress Notes (Signed)
Could you please print and mail result to patient. She did not answer call and did not have voicemail set up. Normal result.

## 2019-09-04 ENCOUNTER — Ambulatory Visit: Payer: Medicare Other | Attending: Internal Medicine

## 2019-09-04 ENCOUNTER — Other Ambulatory Visit: Payer: Self-pay

## 2019-09-04 DIAGNOSIS — Z23 Encounter for immunization: Secondary | ICD-10-CM

## 2019-09-04 NOTE — Progress Notes (Signed)
   Covid-19 Vaccination Clinic  Name:  Angela Russell    MRN: [id number] DOB: 1951-03-22  09/04/2019  [name] was observed post Covid-19 immunization for 15 minutes without incident. She was provided with Vaccine Information Sheet and instruction to access the V-Safe system.   [name] was instructed to call 911 with any severe reactions post vaccine: Marland Kitchen Difficulty breathing  . Swelling of face and throat  . A fast heartbeat  . A bad rash all over body  . Dizziness and weakness   Immunizations Administered    Name Date Dose VIS Date Route   Pfizer COVID-19 Vaccine 09/04/2019  9:55 AM 0.3 mL 05/15/2019 Intramuscular   Manufacturer: [place]   Lot: [id number]   [place]: [id number]

## 2019-09-07 NOTE — Progress Notes (Signed)
Called pt on 09/03/19 and was advised on her normal results, pt advised to call if she has anymore concerns or questions.

## 2019-09-08 NOTE — Progress Notes (Signed)
Pharmacist Chemotherapy Monitoring - Follow Up Assessment    I verify that I have reviewed each item in the below checklist:  . Regimen for the patient is scheduled for the appropriate day and plan matches scheduled date. Marland Kitchen Appropriate non-routine labs are ordered dependent on drug ordered. . If applicable, additional medications reviewed and ordered per protocol based on lifetime cumulative doses and/or treatment regimen.   Plan for follow-up and/or issues identified: No . I-vent associated with next due treatment: No . MD and/or nursing notified: No  Jerren Flinchbaugh K 09/08/2019 9:03 AM

## 2019-09-15 ENCOUNTER — Inpatient Hospital Stay (HOSPITAL_BASED_OUTPATIENT_CLINIC_OR_DEPARTMENT_OTHER): Payer: Medicare Other | Admitting: Oncology

## 2019-09-15 ENCOUNTER — Other Ambulatory Visit: Payer: Self-pay

## 2019-09-15 ENCOUNTER — Inpatient Hospital Stay: Payer: Medicare Other

## 2019-09-15 ENCOUNTER — Encounter: Payer: Self-pay | Admitting: Oncology

## 2019-09-15 ENCOUNTER — Inpatient Hospital Stay: Payer: Medicare Other | Attending: Oncology

## 2019-09-15 VITALS — BP 155/85 | HR 82 | Temp 95.5°F | Resp 18 | Wt 195.9 lb

## 2019-09-15 DIAGNOSIS — I129 Hypertensive chronic kidney disease with stage 1 through stage 4 chronic kidney disease, or unspecified chronic kidney disease: Secondary | ICD-10-CM | POA: Diagnosis not present

## 2019-09-15 DIAGNOSIS — Z86718 Personal history of other venous thrombosis and embolism: Secondary | ICD-10-CM | POA: Diagnosis not present

## 2019-09-15 DIAGNOSIS — Z7901 Long term (current) use of anticoagulants: Secondary | ICD-10-CM

## 2019-09-15 DIAGNOSIS — M810 Age-related osteoporosis without current pathological fracture: Secondary | ICD-10-CM | POA: Diagnosis not present

## 2019-09-15 DIAGNOSIS — E1165 Type 2 diabetes mellitus with hyperglycemia: Secondary | ICD-10-CM | POA: Insufficient documentation

## 2019-09-15 DIAGNOSIS — Z5112 Encounter for antineoplastic immunotherapy: Secondary | ICD-10-CM | POA: Diagnosis not present

## 2019-09-15 DIAGNOSIS — C7910 Secondary malignant neoplasm of unspecified urinary organs: Secondary | ICD-10-CM

## 2019-09-15 DIAGNOSIS — N183 Chronic kidney disease, stage 3 unspecified: Secondary | ICD-10-CM | POA: Insufficient documentation

## 2019-09-15 DIAGNOSIS — R5383 Other fatigue: Secondary | ICD-10-CM | POA: Insufficient documentation

## 2019-09-15 DIAGNOSIS — C679 Malignant neoplasm of bladder, unspecified: Secondary | ICD-10-CM | POA: Diagnosis not present

## 2019-09-15 DIAGNOSIS — Z87891 Personal history of nicotine dependence: Secondary | ICD-10-CM | POA: Insufficient documentation

## 2019-09-15 DIAGNOSIS — Z95828 Presence of other vascular implants and grafts: Secondary | ICD-10-CM

## 2019-09-15 DIAGNOSIS — D631 Anemia in chronic kidney disease: Secondary | ICD-10-CM | POA: Insufficient documentation

## 2019-09-15 LAB — CBC WITH DIFFERENTIAL/PLATELET
Abs Immature Granulocytes: 0.01 10*3/uL (ref 0.00–0.07)
Basophils Absolute: 0 10*3/uL (ref 0.0–0.1)
Basophils Relative: 1 %
Eosinophils Absolute: 0.2 10*3/uL (ref 0.0–0.5)
Eosinophils Relative: 3 %
HCT: 30.6 % — ABNORMAL LOW (ref 36.0–46.0)
Hemoglobin: 9.6 g/dL — ABNORMAL LOW (ref 12.0–15.0)
Immature Granulocytes: 0 %
Lymphocytes Relative: 19 %
Lymphs Abs: 1.1 10*3/uL (ref 0.7–4.0)
MCH: 24.3 pg — ABNORMAL LOW (ref 26.0–34.0)
MCHC: 31.4 g/dL (ref 30.0–36.0)
MCV: 77.5 fL — ABNORMAL LOW (ref 80.0–100.0)
Monocytes Absolute: 0.5 10*3/uL (ref 0.1–1.0)
Monocytes Relative: 8 %
Neutro Abs: 4 10*3/uL (ref 1.7–7.7)
Neutrophils Relative %: 69 %
Platelets: 215 10*3/uL (ref 150–400)
RBC: 3.95 MIL/uL (ref 3.87–5.11)
RDW: 13.7 % (ref 11.5–15.5)
WBC: 5.7 10*3/uL (ref 4.0–10.5)
nRBC: 0 % (ref 0.0–0.2)

## 2019-09-15 LAB — COMPREHENSIVE METABOLIC PANEL
ALT: 12 U/L (ref 0–44)
AST: 18 U/L (ref 15–41)
Albumin: 3.9 g/dL (ref 3.5–5.0)
Alkaline Phosphatase: 98 U/L (ref 38–126)
Anion gap: 10 (ref 5–15)
BUN: 30 mg/dL — ABNORMAL HIGH (ref 8–23)
CO2: 24 mmol/L (ref 22–32)
Calcium: 8.9 mg/dL (ref 8.9–10.3)
Chloride: 106 mmol/L (ref 98–111)
Creatinine, Ser: 1.46 mg/dL — ABNORMAL HIGH (ref 0.44–1.00)
GFR calc Af Amer: 42 mL/min — ABNORMAL LOW (ref >60)
GFR calc non Af Amer: 36 mL/min — ABNORMAL LOW (ref >60)
Glucose, Bld: 154 mg/dL — ABNORMAL HIGH (ref 70–99)
Potassium: 3.9 mmol/L (ref 3.5–5.1)
Sodium: 140 mmol/L (ref 135–145)
Total Bilirubin: 0.5 mg/dL (ref 0.3–1.2)
Total Protein: 7.4 g/dL (ref 6.5–8.1)

## 2019-09-15 LAB — TSH: TSH: 1.66 u[IU]/mL (ref 0.350–4.500)

## 2019-09-15 MED ORDER — SODIUM CHLORIDE 0.9 % IV SOLN
200.0000 mg | Freq: Once | INTRAVENOUS | Status: AC
  Administered 2019-09-15: 200 mg via INTRAVENOUS
  Filled 2019-09-15: qty 8

## 2019-09-15 MED ORDER — HEPARIN SOD (PORK) LOCK FLUSH 100 UNIT/ML IV SOLN
500.0000 [IU] | Freq: Once | INTRAVENOUS | Status: AC | PRN
  Administered 2019-09-15: 500 [IU]
  Filled 2019-09-15: qty 5

## 2019-09-15 MED ORDER — SODIUM CHLORIDE 0.9 % IV SOLN
Freq: Once | INTRAVENOUS | Status: AC
  Filled 2019-09-15: qty 250

## 2019-09-15 MED ORDER — HEPARIN SOD (PORK) LOCK FLUSH 100 UNIT/ML IV SOLN
INTRAVENOUS | Status: AC
  Filled 2019-09-15: qty 5

## 2019-09-15 MED ORDER — SODIUM CHLORIDE 0.9% FLUSH
10.0000 mL | Freq: Once | INTRAVENOUS | Status: AC
  Administered 2019-09-15: 10 mL via INTRAVENOUS
  Filled 2019-09-15: qty 10

## 2019-09-15 NOTE — Progress Notes (Signed)
Patient reports a decline in her energy level.  She has left low abdominal pain that she reports is 8/10 today (not new pain).

## 2019-09-15 NOTE — Progress Notes (Signed)
Hematology/Oncology follow up note [facility] Telephone:([phone]) [phone] [phone]   Patient Care Team: Volney American, PA-C as PCP - General (Family Medicine) Idelle Leech, [place] (Optometry) [name], MD as Consulting Physician (Hematology and Oncology)  REFERRING PROVIDER: Dr.Sninsky CHIEF COMPLAINTS/REASON FOR VISIT:  Follow up for bladder cancer, anemia.   HISTORY OF PRESENTING ILLNESS:  Angela Russell is a  69 y.o.  female with PMH listed below who was referred to me for evaluation of newly diagnosed bladder cancer. Patient initially presented to emergency room at the end of January 2020 for evaluation of dysuria, hematuria and the left lower quadrant inguinal pain and flank pain.  1/28 2020 CT renal stone study showed suspected irregular wall thickening about the superior bladder, not well assessed due to degree of bladder distention.  Recommend cystoscopy for further evaluation.  No renal stone or obstructive uropathy.  Patient was given IV Rocephin and referred patient for outpatient urology follow-up.  Urine culture was negative.  She again presented to ER after 2 days with similar symptoms.  Patient has 25-pack-year smoking history, quit approximately 20 years ago.  No family history of any urology malignancies 07/03/2018 another CT abdomen pelvis with contrast was done which showed no nephrolithiasis or hydronephrosis is identified.  Bladder is decompressed limiting evaluation.  07/16/2018.urology Dr. [name] - cystoscopy and bilateral retrograde pyelogram on 07/16/2018.  Pyelogram did not show any filling defect or abnormalities.  No hydronephrosis.  Ureteral orifice was not involved with tumor.  There is a large 5 cm posterior wall bladder tumor, bullous and sessile appearing.  Patient underwent TURBT.   Pathology: High-grade urothelial carcinoma, invasive into muscularis propria.  Lymphovascular invasion is present.  Carcinoma in situ is also  identified.  Focal squamous differentiation is noted, areas of invasive carcinoma display pleomorphic/sarcomatoid changes.  T2b  08/07/2018 CT without contrast negative.  2 subpleural right upper lobe nodule 2 to 3 mm likely benign. She also had baseline audiometry done.  08/04/2018 ddMVAC x 1 cycle, stopped due to intolerance and AKI.  Patient received 1 cycle of dd MVAC, not able to tolerate due to AKI. Patient then was referred to [facility] urology  09/22/2018 patient underwent a cystectomy, pathology pT3a N0 Mx.  11 lymph nodes were harvested and was all negative. Invasive urothelia carcinoma, high grade, with sarcomatoid features.   10/14/2018 patient was admitted due to pyelonephritis and a pelvic fluid collection.  Drain was placed. 10/23/2018 drain was removed. Patient has had difficulties getting to her appointments to [facility]. 02/09/2019, patient presented with abdominal pain. CT concerning for small bowel obstruction and increased size of right pelvic fluid collection concerning for cancer recurrence.  Right hydronephrosis to the level of pelvis and enlarged lymph nodes. Patient had JP drain placed with CT guidance to pelvic fluid collection and drained 400 cc amber fluid.  Culture was negative for growth of microorganisms and cytology was negative for malignancy.-JP drain was removed on the day of discharge. CT-guided core biopsy of pelvic lymph node adenopathy was attempted but not successful.  # Right lower extremity DVT, provoked by Transvenous biopsy  02/26/2019 transvenous biopsy by IR unsuccessful transvenous biopsy of the right pelvis mass. Post biopsy acute thrombus in the right external iliac and common femoral vein.  Patient was recommended to start anticoagulation with Xarelto however due to the co-pay, patient is not able to afford the medication. Anticoagulation regimen was switched to Eliquis 5 mg.  Patient reports that she has been taking  it once a day. 03/20/2019 PET showed  FDG avid tissue in the cystectomy bed, retroperitoneal and pelvic lymphadenopathy.  Right common iliac DVT.  # increased right lower extremity swelling.  She was started on Eliquis for anticoagulation by [facility].  We clarified with her pharmacy and she was actually taking Eliquis 2.5 mg twice daily. Right lower extremity swelling has not improved but instead worsened. 03/16/2019 She had ultrasound right lower extremity done which showed persistent extensive proximal right lower extremity DVT.  Anticoagulation regimen has increased to Eliquis 5 mg twice daily.  # establish care with [facility] oncology Dr. [name] for evaluation.  Dr. [name] recommended starting immunotherapy with PD-L1 inhibitor Pembrolizumab 200 mg every 3 weeks.  The sarcomatoid histology may not respond to chemotherapy well, could portend a better chance of response into immunotherapy PET scan after 4 cycles of Keytruda showed single mildly enlarged central mesenteric lymph node in the upper pelvis with SUV 9.6.  No other abnormal hypermetabolic activity was reported.  # 03/30/2019 Status post IVC filter placement, mechanical thrombectomy.-IVC filter was retrieved in January 2021. # PD-L1 CPS 100%.  # 03/31/2019 started on immunotherapy Keytruda.   INTERVAL HISTORY Angela Russell is a 69 y.o. female who has above history reviewed by me today presents for follow up visit for evaluation form metastatic high-grade urothelial carcinoma of the bladder. Sarcomatoid feature.  Continues to feel tried and fatigued. Decreased energy level.  She continues to have chronic intermittent burning sensation on her left    Review of Systems  Constitutional: Negative for appetite change, chills, fatigue and fever.  HENT:   Negative for hearing loss and voice change.   Eyes: Negative for eye problems.  Respiratory: Negative for chest tightness and cough.   Cardiovascular: Negative for chest pain and leg swelling.  Gastrointestinal: Negative for  abdominal distention, abdominal pain and blood in stool.  Endocrine: Negative for hot flashes.  Genitourinary: Negative for difficulty urinating and frequency.   Musculoskeletal: Negative for arthralgias.       Right forearm tenderness  Skin: Negative for itching and rash.       Left abdomen skin burning sensation  Neurological: Positive for numbness. Negative for extremity weakness.  Hematological: Negative for adenopathy.  Psychiatric/Behavioral: Negative for confusion. The patient is not nervous/anxious.     MEDICAL HISTORY:  Past Medical History:  Diagnosis Date   Carotid artery plaque, right 01/2014   CKD (chronic kidney disease)    stage 2-3   Diabetes mellitus without complication (HCC)    DM (diabetes mellitus), type 2, uncontrolled (HCC)    Hyperlipidemia    Hypertension    Hypochromic microcytic anemia    mild   Metastatic urothelial carcinoma ([place]) 03/23/2019   Osteoporosis    Renal insufficiency    Rotator cuff tendonitis, right     SURGICAL HISTORY: Past Surgical History:  Procedure Laterality Date   ABDOMINAL HYSTERECTOMY  2000   due to bleeding and fibroids, partial- still has ovaries   CYSTOSCOPY W/ RETROGRADES Bilateral 07/16/2018   Procedure: CYSTOSCOPY WITH RETROGRADE PYELOGRAM;  Surgeon: Billey Co, MD;  Location: [facility] OR[facility];  Service: Urology;  Laterality: Bilateral;   IVC FILTER INSERTION N/A 03/30/2019   Procedure: IVC FILTER INSERTION;  Surgeon: Algernon Huxley, MD;  Location: [place] [facility];  Service: Cardiovascular;  Laterality: N/A;   IVC FILTER REMOVAL N/A 06/15/2019   Procedure: IVC FILTER REMOVAL;  Surgeon: Algernon Huxley, MD;  Location: [place] [facility];  Service: Cardiovascular;  Laterality: N/A;   KNEE SURGERY Left 03/17/2013   torn meniscus   PERIPHERAL VASCULAR THROMBECTOMY Right 03/30/2019   Procedure: PERIPHERAL VASCULAR THROMBECTOMY;  Surgeon: Algernon Huxley, MD;  Location: [place] [facility];  Service:  Cardiovascular;  Laterality: Right;   PORTA CATH INSERTION N/A 08/06/2018   Procedure: PORTA CATH INSERTION;  Surgeon: Algernon Huxley, MD;  Location: [place] [facility];  Service: Cardiovascular;  Laterality: N/A;   TRANSURETHRAL RESECTION OF BLADDER TUMOR N/A 07/16/2018   Procedure: TRANSURETHRAL RESECTION OF BLADDER TUMOR (TURBT);  Surgeon: Billey Co, MD;  Location: [facility];  Service: Urology;  Laterality: N/A;    SOCIAL HISTORY: Social History   Socioeconomic History   Marital status: Single    Spouse name: Not on file   Number of children: 1   Years of education: Not on file   Highest education level: 10th grade  Occupational History   Not on file  Tobacco Use   Smoking status: Former Smoker    Quit date: 06/05/1991    Years since quitting: 28.2   Smokeless tobacco: Never Used  Substance and Sexual Activity   Alcohol use: No   Drug use: No   Sexual activity: Never  Other Topics Concern   Not on file  Social History Narrative   Working full time   Social Determinants of Radio broadcast assistant Strain:    Difficulty of Paying Living Expenses:   Food Insecurity:    Worried About Charity fundraiser in the Last Year:    Arboriculturist in the Last Year:   Transportation Needs:    Film/video editor (Medical):    Lack of Transportation (Non-Medical):   Physical Activity:    Days of Exercise per Week:    Minutes of Exercise per Session:   Stress:    Feeling of Stress :   Social Connections:    Frequency of Communication with Friends and Family:    Frequency of Social Gatherings with Friends and Family:    Attends Religious Services:    Active Member of Clubs or Organizations:    Attends Music therapist:    Marital Status:   Intimate Partner Violence:    Fear of Current or Ex-Partner:    Emotionally Abused:    Physically Abused:    Sexually Abused:     FAMILY HISTORY: Family History  Problem Relation  Age of Onset   Heart disease Mother    Heart attack Mother    Arthritis Father    Diabetes Brother     ALLERGIES:  has No Known Allergies.  MEDICATIONS:  Current Outpatient Medications  Medication Sig Dispense Refill   amLODipine (NORVASC) 5 MG tablet Take 1 tablet (5 mg total) by mouth daily. 90 tablet 1   apixaban (ELIQUIS) 5 MG TABS tablet Take 1 tablet (5 mg total) by mouth 2 (two) times daily. 60 tablet 3   diclofenac sodium (VOLTAREN) 1 % GEL Apply 2 g topically 4 (four) times daily. 100 g 2   lidocaine-prilocaine (EMLA) cream Apply to affected area once 30 g 3   polyethylene glycol (MIRALAX / GLYCOLAX) 17 g packet Take 17 g by mouth daily as needed for mild constipation. 14 each 0   rosuvastatin (CRESTOR) 20 MG tablet Take 1 tablet (20 mg total) by mouth daily. 90 tablet 1   No current facility-administered medications for this visit.     PHYSICAL EXAMINATION: ECOG PERFORMANCE STATUS: 1 - Symptomatic but  completely ambulatory Vitals:   09/15/19 0914  BP: (!) 155/85  Pulse: 82  Resp: 18  Temp: (!) 95.5 F (35.3 C)   Filed Weights   09/15/19 0914  Weight: 195 lb 14.4 oz (88.9 kg)    Physical Exam Constitutional:      General: She is not in acute distress.    Comments: Walk independently  HENT:     Head: Normocephalic and atraumatic.  Eyes:     General: No scleral icterus.    Pupils: Pupils are equal, round, and reactive to light.  Cardiovascular:     Rate and Rhythm: Normal rate and regular rhythm.     Heart sounds: Normal heart sounds.  Pulmonary:     Effort: Pulmonary effort is normal. No respiratory distress.     Breath sounds: No wheezing.  Abdominal:     General: Bowel sounds are normal. There is no distension.     Palpations: Abdomen is soft. There is no mass.  Musculoskeletal:        General: No deformity. Normal range of motion.     Cervical back: Normal range of motion and neck supple.  Skin:    General: Skin is warm and dry.      Coloration: Skin is not pale.     Findings: No erythema or rash.  Neurological:     Mental Status: She is alert and oriented to person, place, and time. Mental status is at baseline.     Cranial Nerves: No cranial nerve deficit.     Coordination: Coordination normal.  Psychiatric:        Mood and Affect: Mood normal.     RADIOGRAPHIC STUDIES: I have personally reviewed the radiological images as listed and agreed with the findings in the report.  CMP Latest Ref Rng & Units 09/15/2019  Glucose 70 - 99 mg/dL 154(H)  BUN 8 - 23 mg/dL 30(H)  Creatinine 0.44 - 1.00 mg/dL 1.46(H)  Sodium 135 - 145 mmol/L 140  Potassium 3.5 - 5.1 mmol/L 3.9  Chloride 98 - 111 mmol/L 106  CO2 22 - 32 mmol/L 24  Calcium 8.9 - 10.3 mg/dL 8.9  Total Protein 6.5 - 8.1 g/dL 7.4  Total Bilirubin 0.3 - 1.2 mg/dL 0.5  Alkaline Phos 38 - 126 U/L 98  AST 15 - 41 U/L 18  ALT 0 - 44 U/L 12   CBC Latest Ref Rng & Units 09/15/2019  WBC 4.0 - 10.5 K/uL 5.7  Hemoglobin 12.0 - 15.0 g/dL 9.6(L)  Hematocrit 36.0 - 46.0 % 30.6(L)  Platelets 150 - 400 K/uL 215    LABORATORY DATA:  I have reviewed the data as listed Lab Results  Component Value Date   WBC 5.7 09/15/2019   HGB 9.6 (L) 09/15/2019   HCT 30.6 (L) 09/15/2019   MCV 77.5 (L) 09/15/2019   PLT 215 09/15/2019   Recent Labs    12/19/18 1841 12/20/18 0004 08/04/19 0913 08/25/19 0923 09/15/19 0850  NA 137   < > 140 139 140  K 6.3*   < > 4.2 3.9 3.9  CL 115*   < > 107 108 106  CO2 15*   < > _0 GLUCOSE 129*   < > 158* 149* 154*  BUN 37*   < > 27* 30* 30*  CREATININE 2.03*   < > 1.50* 1.43* 1.46*  CALCIUM 9.7   < > 9.0 9.0 8.9  GFRNONAA 24*   < > 35* 37* 36*  GFRAA 28*   < >  41* 43* 42*  PROT  --    < > 7.4 7.4 7.4  ALBUMIN  --    < > 3.8 4.0 3.9  AST  --    < > _0 ALT  --    < > _1 ALKPHOS  --    < > 96 98 98  BILITOT 0.5   < > 0.6 0.7 0.5  BILIDIR <0.1  --   --   --   --   IBILI NOT CALCULATED  --   --   --   --    < > =  values in this interval not displayed.   Iron/TIBC/Ferritin/ %Sat    Component Value Date/Time   IRON 46 03/04/2019 1423   IRON 26 (L) 02/04/2019 1309   TIBC 267 03/04/2019 1423   TIBC 251 02/04/2019 1309   FERRITIN 316 (H) 03/04/2019 1423   FERRITIN 409 (H) 02/04/2019 1309   IRONPCTSAT 17 03/04/2019 1423   IRONPCTSAT 10 (L) 02/04/2019 1309    RADIOGRAPHIC STUDIES: I have personally reviewed the radiological images as listed and agreed with the findings in the report. NM PET Image Restag (PS) Skull Base To Thigh  Result Date: 09/01/2019 CLINICAL DATA:  Subsequent treatment strategy for metastatic urothelial carcinoma with ongoing immunotherapy. History of cystectomy and loop ileostomy. EXAM: NUCLEAR MEDICINE PET SKULL BASE TO THIGH TECHNIQUE: 10.6 mCi F-18 FDG was injected intravenously. Full-ring PET imaging was performed from the skull base to thigh after the radiotracer. CT data was obtained and used for attenuation correction and anatomic localization. Fasting blood glucose: 153 mg/dl COMPARISON:  06/18/2019 PET-CT. FINDINGS: Mediastinal blood pool activity: SUV max 3.0 Liver activity: SUV max NA NECK: No hypermetabolic lymph nodes in the neck. Incidental CT findings: none CHEST: No enlarged or hypermetabolic axillary, mediastinal or hilar lymph nodes. No hypermetabolic pulmonary findings. Incidental CT findings: Non hypermetabolic dense 2.1 cm soft tissue nodule in the midline retro-manubrial high prevascular mediastinum (series 3/image 65), stable, Russell an exophytic thyroid nodule. Right internal jugular Port-A-Cath terminates in the right atrium. Three-vessel coronary atherosclerosis. Atherosclerotic nonaneurysmal thoracic aorta. A few new scattered tiny solid pulmonary nodules in the posterior upper lobes bilaterally, largest 4 mm in the left upper lobe (series 3/image 74), below PET resolution. ABDOMEN/PELVIS: Previously visualized hypermetabolic 1.0 cm right mesenteric node with max SUV  9.6 is decreased in size to 0.6 cm (series 3/image 176) and decreased in metabolism with max SUV 2.1. No enlarged or hypermetabolic lymph nodes in the abdomen or pelvis on today's scan. No abnormal hypermetabolic activity within the liver, pancreas, adrenal glands, or spleen. Incidental CT findings: Stable postsurgical changes from cystectomy and diverting loop ileostomy in the ventral right abdominal wall. Vascular stent noted in right external iliac vein. Bilateral pelvic lymph node dissection clips. Hysterectomy. SKELETON: No focal hypermetabolic activity to suggest skeletal metastasis. Incidental CT findings: none IMPRESSION: 1. Response to therapy. Previously hypermetabolic right mesenteric lymph node is decreased in size and is no longer hypermetabolic. No hypermetabolic metastatic disease on today's scan. 2. Chronic findings include: Aortic Atherosclerosis (ICD10-I70.0). Three-vessel coronary atherosclerosis. Stable non-hypermetabolic retro-manubrial 2.1 cm soft tissue nodule, Russell an exophytic thyroid nodule, for which no follow-up is required given patient co-morbidities, nodule stability and difficult location to evaluate by ultrasound. (ref: J Am Coll Radiol. 2015 Feb;12(2): 143-50). Electronically Signed   By: Ilona Sorrel M.D.   On: 09/01/2019 14:23   NM PET Image Restag (PS) Skull Base To Thigh  Result Date: 06/18/2019 CLINICAL DATA:  Subsequent treatment strategy for metastatic urothelial carcinoma. EXAM: NUCLEAR MEDICINE PET SKULL BASE TO THIGH TECHNIQUE: 10.3 mCi F-18 FDG was injected intravenously. Full-ring PET imaging was performed from the skull base to thigh after the radiotracer. CT data was obtained and used for attenuation correction and anatomic localization. Fasting blood glucose: 127 mg/dl COMPARISON:  Multiple exams, including CT examinations from 10/13/2018 and 08/07/2018 FINDINGS: Mediastinal blood pool activity: SUV max 2.8 Liver activity: SUV max NA NECK: Incidental physiologic  muscular activity. Incidental CT findings: Atherosclerotic calcification of the left common carotid artery. CHEST: The tiny right upper lobe nodules seen on the CT from 08/07/2018 are not changed compared to the prior exam. These lesions are not hypermetabolic but are below sensitive PET-CT size thresholds, and measure in the 2-3 mm diameter range. Incidental CT findings: Right Port-A-Cath tip: Right atrium. Right subcoracoid bursitis. Aberrant right subclavian artery passes behind the esophagus. Coronary, aortic arch, and branch vessel atherosclerotic vascular disease. ABDOMEN/PELVIS: A hypermetabolic lymph node in the upper pelvic mesentery slightly eccentric to the right measures 1.1 cm in short axis on image 184/3, and has a maximum SUV of 9.6, highly suspicious for malignancy. Excreted FDG noted in the ileal loop. Incidental CT findings: Peristomal herniation of small bowel loop without strangulation or obstruction. Prominent stool throughout the colon favors constipation. Aortoiliac atherosclerotic vascular disease. No current hydronephrosis. Right iliac vein stent. Persistent likely loculated fluid collections in the pelvis and along the left pelvic sidewall. SKELETON: No significant abnormal hypermetabolic activity in this region. Incidental CT findings: Degenerative arthropathy of both hips. IMPRESSION: 1. A single mildly enlarged central mesenteric lymph node in the upper pelvis has a maximum SUV of 9.6, suspicious for recurrent malignancy. 2. Peristomal herniation of a small bowel loop without strangulation or obstruction. 3. Other imaging findings of potential clinical significance: Aortic Atherosclerosis (ICD10-I70.0). Coronary atherosclerosis. Aberrant right subclavian artery. Right subcoracoid bursitis. Prominent stool throughout the colon favors constipation. Persistent loculated fluid collections in the pelvis and along the left pelvic sidewall (query postoperative seromas). Electronically Signed    By: Van Clines M.D.   On: 06/18/2019 12:57     ASSESSMENT & PLAN:  1. Metastatic urothelial carcinoma ([place])   2. Encounter for antineoplastic immunotherapy   3. Chronic anticoagulation   4. History of DVT of lower extremity   5. Fatigue, unspecified type   Cancer Staging Bladder carcinoma [facility]) Staging form: Urinary Bladder, AJCC 8th Edition - Clinical stage from 08/01/2018: Stage IVA (cTX, cN3, cM1a) - Signed by [name], MD on 04/21/2019  #Metastatic urothelial carcinoma of bladder, sarcomatoid features pelvic sidewall mass showed metastatic carcinoma consistent with involvement by urothelial carcinoma. Patient has been on Keytruda every 3 weeks and overall tolerates well.   PET scan was independently reviewed and discussed with patient.  Excellent response to immunotherapy. No metastatic disease on current PET scan.  Continue Keytruda.   # Fatigue, TSH has been monitored and has been normal. Today Will check morning cortisol level.  # Chronic anemia, due to CKD. Hemoglobin has been stable at 9.6. Erythropoietin therapy is contraindicated due to active cancer. Microcytosis, hemoglobinopathy evaluation was done previously and was normal. She may have alpha thalassemia.   # left abdomen skin burning sensation, no rash.   #Right lower extremity  femoral vein DVT secondary to transvenous biopsy in the context of cancer recurrence Status post IV C filter and mechanical thrombectomy.  IVC filter has been removed. Continue Eliquis 5 mg twice daily,  planning decrease to Eliquis 2.13m BID.  All questions were answered. The patient knows to call the clinic with any problems questions or concerns.  Return of visit: 3 weeks.    ZEarlie Server MD, PhD Hematology Oncology [facility])at ARegional Rehabilitation InstitutePager- [phone]/13/21

## 2019-09-17 ENCOUNTER — Inpatient Hospital Stay: Payer: Medicare Other

## 2019-09-17 ENCOUNTER — Other Ambulatory Visit: Payer: Self-pay

## 2019-09-17 DIAGNOSIS — Z5112 Encounter for antineoplastic immunotherapy: Secondary | ICD-10-CM | POA: Diagnosis not present

## 2019-09-17 DIAGNOSIS — C679 Malignant neoplasm of bladder, unspecified: Secondary | ICD-10-CM | POA: Diagnosis not present

## 2019-09-17 DIAGNOSIS — N183 Chronic kidney disease, stage 3 unspecified: Secondary | ICD-10-CM | POA: Diagnosis not present

## 2019-09-17 DIAGNOSIS — D631 Anemia in chronic kidney disease: Secondary | ICD-10-CM | POA: Diagnosis not present

## 2019-09-17 DIAGNOSIS — I129 Hypertensive chronic kidney disease with stage 1 through stage 4 chronic kidney disease, or unspecified chronic kidney disease: Secondary | ICD-10-CM | POA: Diagnosis not present

## 2019-09-17 DIAGNOSIS — R5383 Other fatigue: Secondary | ICD-10-CM | POA: Diagnosis not present

## 2019-09-17 DIAGNOSIS — C7910 Secondary malignant neoplasm of unspecified urinary organs: Secondary | ICD-10-CM

## 2019-09-17 LAB — CORTISOL: Cortisol, Plasma: 13.9 ug/dL

## 2019-09-29 ENCOUNTER — Ambulatory Visit: Payer: Medicare Other | Attending: Internal Medicine

## 2019-09-29 DIAGNOSIS — G5601 Carpal tunnel syndrome, right upper limb: Secondary | ICD-10-CM | POA: Diagnosis not present

## 2019-09-29 DIAGNOSIS — M7711 Lateral epicondylitis, right elbow: Secondary | ICD-10-CM | POA: Diagnosis not present

## 2019-09-29 DIAGNOSIS — Z23 Encounter for immunization: Secondary | ICD-10-CM

## 2019-09-29 NOTE — Progress Notes (Signed)
Pharmacist Chemotherapy Monitoring - Follow Up Assessment    I verify that I have reviewed each item in the below checklist:  . Regimen for the patient is scheduled for the appropriate day and plan matches scheduled date. Marland Kitchen Appropriate non-routine labs are ordered dependent on drug ordered. . If applicable, additional medications reviewed and ordered per protocol based on lifetime cumulative doses and/or treatment regimen.   Plan for follow-up and/or issues identified: No . I-vent associated with next due treatment: No . MD and/or nursing notified: No  Angela Russell K 09/29/2019 8:34 AM

## 2019-09-29 NOTE — Progress Notes (Signed)
   Covid-19 Vaccination Clinic  Name:  Angela Russell    MRN: [id number] DOB: 09-Dec-1950  09/29/2019  [name] was observed post Covid-19 immunization for 15 minutes without incident. She was provided with Vaccine Information Sheet and instruction to access the V-Safe system.   [name] was instructed to call 911 with any severe reactions post vaccine: Marland Kitchen Difficulty breathing  . Swelling of face and throat  . A fast heartbeat  . A bad rash all over body  . Dizziness and weakness   Immunizations Administered    Name Date Dose VIS Date Route   Pfizer COVID-19 Vaccine 09/29/2019  4:09 PM 0.3 mL 07/29/2018 Intramuscular   Manufacturer: [place]   Lot: [id number]   [place]: [id number]

## 2019-10-06 ENCOUNTER — Inpatient Hospital Stay: Payer: Medicare Other | Attending: Oncology

## 2019-10-06 ENCOUNTER — Inpatient Hospital Stay (HOSPITAL_BASED_OUTPATIENT_CLINIC_OR_DEPARTMENT_OTHER): Payer: Medicare Other | Admitting: Oncology

## 2019-10-06 ENCOUNTER — Other Ambulatory Visit: Payer: Self-pay

## 2019-10-06 ENCOUNTER — Encounter: Payer: Self-pay | Admitting: Oncology

## 2019-10-06 ENCOUNTER — Inpatient Hospital Stay: Payer: Medicare Other

## 2019-10-06 VITALS — BP 155/79 | HR 80 | Temp 96.0°F | Resp 18 | Wt 195.2 lb

## 2019-10-06 DIAGNOSIS — N183 Chronic kidney disease, stage 3 unspecified: Secondary | ICD-10-CM | POA: Insufficient documentation

## 2019-10-06 DIAGNOSIS — Z7901 Long term (current) use of anticoagulants: Secondary | ICD-10-CM | POA: Diagnosis not present

## 2019-10-06 DIAGNOSIS — C679 Malignant neoplasm of bladder, unspecified: Secondary | ICD-10-CM | POA: Diagnosis not present

## 2019-10-06 DIAGNOSIS — Z79899 Other long term (current) drug therapy: Secondary | ICD-10-CM | POA: Insufficient documentation

## 2019-10-06 DIAGNOSIS — E041 Nontoxic single thyroid nodule: Secondary | ICD-10-CM | POA: Insufficient documentation

## 2019-10-06 DIAGNOSIS — M810 Age-related osteoporosis without current pathological fracture: Secondary | ICD-10-CM | POA: Diagnosis not present

## 2019-10-06 DIAGNOSIS — Z5112 Encounter for antineoplastic immunotherapy: Secondary | ICD-10-CM | POA: Diagnosis not present

## 2019-10-06 DIAGNOSIS — E785 Hyperlipidemia, unspecified: Secondary | ICD-10-CM | POA: Diagnosis not present

## 2019-10-06 DIAGNOSIS — I700 Atherosclerosis of aorta: Secondary | ICD-10-CM | POA: Diagnosis not present

## 2019-10-06 DIAGNOSIS — I2510 Atherosclerotic heart disease of native coronary artery without angina pectoris: Secondary | ICD-10-CM | POA: Insufficient documentation

## 2019-10-06 DIAGNOSIS — R918 Other nonspecific abnormal finding of lung field: Secondary | ICD-10-CM | POA: Insufficient documentation

## 2019-10-06 DIAGNOSIS — M7989 Other specified soft tissue disorders: Secondary | ICD-10-CM | POA: Insufficient documentation

## 2019-10-06 DIAGNOSIS — Z86718 Personal history of other venous thrombosis and embolism: Secondary | ICD-10-CM | POA: Insufficient documentation

## 2019-10-06 DIAGNOSIS — R823 Hemoglobinuria: Secondary | ICD-10-CM | POA: Insufficient documentation

## 2019-10-06 DIAGNOSIS — Z95828 Presence of other vascular implants and grafts: Secondary | ICD-10-CM

## 2019-10-06 DIAGNOSIS — R319 Hematuria, unspecified: Secondary | ICD-10-CM | POA: Insufficient documentation

## 2019-10-06 DIAGNOSIS — I129 Hypertensive chronic kidney disease with stage 1 through stage 4 chronic kidney disease, or unspecified chronic kidney disease: Secondary | ICD-10-CM | POA: Insufficient documentation

## 2019-10-06 DIAGNOSIS — R911 Solitary pulmonary nodule: Secondary | ICD-10-CM | POA: Diagnosis not present

## 2019-10-06 DIAGNOSIS — E1165 Type 2 diabetes mellitus with hyperglycemia: Secondary | ICD-10-CM | POA: Insufficient documentation

## 2019-10-06 DIAGNOSIS — N179 Acute kidney failure, unspecified: Secondary | ICD-10-CM | POA: Diagnosis not present

## 2019-10-06 DIAGNOSIS — C7910 Secondary malignant neoplasm of unspecified urinary organs: Secondary | ICD-10-CM

## 2019-10-06 DIAGNOSIS — Z87891 Personal history of nicotine dependence: Secondary | ICD-10-CM | POA: Insufficient documentation

## 2019-10-06 DIAGNOSIS — D509 Iron deficiency anemia, unspecified: Secondary | ICD-10-CM | POA: Diagnosis not present

## 2019-10-06 DIAGNOSIS — E1122 Type 2 diabetes mellitus with diabetic chronic kidney disease: Secondary | ICD-10-CM | POA: Insufficient documentation

## 2019-10-06 LAB — CBC WITH DIFFERENTIAL/PLATELET
Abs Immature Granulocytes: 0.02 10*3/uL (ref 0.00–0.07)
Basophils Absolute: 0 10*3/uL (ref 0.0–0.1)
Basophils Relative: 1 %
Eosinophils Absolute: 0.1 10*3/uL (ref 0.0–0.5)
Eosinophils Relative: 2 %
HCT: 32.3 % — ABNORMAL LOW (ref 36.0–46.0)
Hemoglobin: 9.9 g/dL — ABNORMAL LOW (ref 12.0–15.0)
Immature Granulocytes: 0 %
Lymphocytes Relative: 26 %
Lymphs Abs: 1.4 10*3/uL (ref 0.7–4.0)
MCH: 24.1 pg — ABNORMAL LOW (ref 26.0–34.0)
MCHC: 30.7 g/dL (ref 30.0–36.0)
MCV: 78.6 fL — ABNORMAL LOW (ref 80.0–100.0)
Monocytes Absolute: 0.5 10*3/uL (ref 0.1–1.0)
Monocytes Relative: 9 %
Neutro Abs: 3.3 10*3/uL (ref 1.7–7.7)
Neutrophils Relative %: 62 %
Platelets: 235 10*3/uL (ref 150–400)
RBC: 4.11 MIL/uL (ref 3.87–5.11)
RDW: 13.8 % (ref 11.5–15.5)
WBC: 5.3 10*3/uL (ref 4.0–10.5)
nRBC: 0 % (ref 0.0–0.2)

## 2019-10-06 LAB — URINALYSIS, COMPLETE (UACMP) WITH MICROSCOPIC
Bilirubin Urine: NEGATIVE
Glucose, UA: NEGATIVE mg/dL
Ketones, ur: NEGATIVE mg/dL
Nitrite: POSITIVE — AB
Protein, ur: 30 mg/dL — AB
Specific Gravity, Urine: 1.011 (ref 1.005–1.030)
pH: 6 (ref 5.0–8.0)

## 2019-10-06 LAB — COMPREHENSIVE METABOLIC PANEL
ALT: 14 U/L (ref 0–44)
AST: 19 U/L (ref 15–41)
Albumin: 4.1 g/dL (ref 3.5–5.0)
Alkaline Phosphatase: 110 U/L (ref 38–126)
Anion gap: 9 (ref 5–15)
BUN: 37 mg/dL — ABNORMAL HIGH (ref 8–23)
CO2: 23 mmol/L (ref 22–32)
Calcium: 9.1 mg/dL (ref 8.9–10.3)
Chloride: 109 mmol/L (ref 98–111)
Creatinine, Ser: 1.56 mg/dL — ABNORMAL HIGH (ref 0.44–1.00)
GFR calc Af Amer: 39 mL/min — ABNORMAL LOW (ref >60)
GFR calc non Af Amer: 33 mL/min — ABNORMAL LOW (ref >60)
Glucose, Bld: 142 mg/dL — ABNORMAL HIGH (ref 70–99)
Potassium: 4.3 mmol/L (ref 3.5–5.1)
Sodium: 141 mmol/L (ref 135–145)
Total Bilirubin: 0.5 mg/dL (ref 0.3–1.2)
Total Protein: 7.7 g/dL (ref 6.5–8.1)

## 2019-10-06 LAB — TSH: TSH: 1.595 u[IU]/mL (ref 0.350–4.500)

## 2019-10-06 MED ORDER — SODIUM CHLORIDE 0.9 % IV SOLN
200.0000 mg | Freq: Once | INTRAVENOUS | Status: AC
  Administered 2019-10-06: 200 mg via INTRAVENOUS
  Filled 2019-10-06: qty 8

## 2019-10-06 MED ORDER — SODIUM CHLORIDE 0.9% FLUSH
10.0000 mL | INTRAVENOUS | Status: DC | PRN
  Administered 2019-10-06: 09:00:00 10 mL via INTRAVENOUS
  Filled 2019-10-06: qty 10

## 2019-10-06 MED ORDER — HEPARIN SOD (PORK) LOCK FLUSH 100 UNIT/ML IV SOLN
500.0000 [IU] | Freq: Once | INTRAVENOUS | Status: AC | PRN
  Administered 2019-10-06: 12:00:00 500 [IU]
  Filled 2019-10-06: qty 5

## 2019-10-06 MED ORDER — SODIUM CHLORIDE 0.9 % IV SOLN
Freq: Once | INTRAVENOUS | Status: AC
  Filled 2019-10-06: qty 250

## 2019-10-06 MED ORDER — HEPARIN SOD (PORK) LOCK FLUSH 100 UNIT/ML IV SOLN
INTRAVENOUS | Status: AC
  Filled 2019-10-06: qty 5

## 2019-10-06 NOTE — Progress Notes (Signed)
Patient here for follow. Pt reports having blood in her urine and soreness to groin area for the past 2 weeks. Pt also continues having pain to left side stomach.

## 2019-10-06 NOTE — Progress Notes (Signed)
Hematology/Oncology follow up note [facility] [phone] [phone]   Patient Care Team: Angela Russell, Angela Elizabeth, PA-C as PCP - General (Family Medicine) Angela Russell, Angela Russell, OD (Optometry) Angela Torelli, MD as Consulting Physician (Hematology and Oncology)  REFERRING PROVIDER: Angela Russell CHIEF COMPLAINTS/REASON FOR VISIT:  Follow up for bladder cancer, anemia.   HISTORY OF PRESENTING ILLNESS:  Angela Russell is a  69 y.o.  female with PMH listed below who was referred to me for evaluation of newly diagnosed bladder cancer. Patient initially presented to emergency room at the end of January 2020 for evaluation of dysuria, hematuria and the left lower quadrant inguinal pain and flank pain.  1/28 2020 CT renal stone study showed suspected irregular wall thickening about the superior bladder, not well assessed due to degree of bladder distention.  Recommend cystoscopy for further evaluation.  No renal stone or obstructive uropathy.  Patient was given IV Rocephin and referred patient for outpatient urology follow-up.  Urine culture was negative.  She again presented to ER after 2 days with similar symptoms.  Patient has 25-pack-year smoking history, quit approximately 20 years ago.  No family history of any urology malignancies 07/03/2018 another CT abdomen pelvis with contrast was done which showed no nephrolithiasis or hydronephrosis is identified.  Bladder is decompressed limiting evaluation.  07/16/2018.urology Dr. Sinskey - cystoscopy and bilateral retrograde pyelogram on 07/16/2018.  Pyelogram did not show any filling defect or abnormalities.  No hydronephrosis.  Ureteral orifice was not involved with tumor.  There is a large 5 cm posterior wall bladder tumor, bullous and sessile appearing.  Patient underwent TURBT.   Pathology: High-grade urothelial carcinoma, invasive into muscularis propria.  Lymphovascular invasion is present.  Carcinoma in situ is also  identified.  Focal squamous differentiation is noted, areas of invasive carcinoma display pleomorphic/sarcomatoid changes.  T2b  08/07/2018 CT without contrast negative.  2 subpleural right upper lobe nodule 2 to 3 mm likely benign. She also had baseline audiometry done.  08/04/2018 ddMVAC x 1 cycle, stopped due to intolerance and AKI.  Patient received 1 cycle of dd MVAC, not able to tolerate due to AKI. Patient then was referred to [facility]  09/22/2018 patient underwent a cystectomy, pathology pT3a N0 Mx.  11 lymph nodes were harvested and was all negative. Invasive urothelia carcinoma, high grade, with sarcomatoid features.   10/14/2018 patient was admitted due to pyelonephritis and a pelvic fluid collection.  Drain was placed. 10/23/2018 drain was removed. Patient has had difficulties getting to her appointments to [facility]. 02/09/2019, patient presented with abdominal pain. CT concerning for small bowel obstruction and increased size of right pelvic fluid collection concerning for cancer recurrence.  Right hydronephrosis to the level of pelvis and enlarged lymph nodes. Patient had JP drain placed with CT guidance to pelvic fluid collection and drained 400 cc amber fluid.  Culture was negative for growth of microorganisms and cytology was negative for malignancy.-JP drain was removed on the day of discharge. CT-guided core biopsy of pelvic lymph node adenopathy was attempted but not successful.  # Right lower extremity DVT, provoked by Transvenous biopsy  02/26/2019 transvenous biopsy by IR unsuccessful transvenous biopsy of the right pelvis mass. Post biopsy acute thrombus in the right external iliac and common femoral vein.  Patient was recommended to start anticoagulation with Xarelto however due to the co-pay, patient is not able to afford the medication. Anticoagulation regimen was switched to Eliquis 5 mg.  Patient reports that she has been taking   it once a day. 03/20/2019 PET showed  FDG avid tissue in the cystectomy bed, retroperitoneal and pelvic lymphadenopathy.  Right common iliac DVT.  # increased right lower extremity swelling.  She was started on Eliquis for anticoagulation by [facility].  We clarified with her pharmacy and she was actually taking Eliquis 2.5 mg twice daily. Right lower extremity swelling has not improved but instead worsened. 03/16/2019 She had ultrasound right lower extremity done which showed persistent extensive proximal right lower extremity DVT.  Anticoagulation regimen has increased to Eliquis 5 mg twice daily.  # establish care with [facility] oncology Dr. [name] for evaluation.  Dr. [name] recommended starting immunotherapy with PD-L1 inhibitor Pembrolizumab 200 mg every 3 weeks.  The sarcomatoid histology may not respond to chemotherapy well, could portend a better chance of response into immunotherapy PET scan after 4 cycles of Keytruda showed single mildly enlarged central mesenteric lymph node in the upper pelvis with SUV 9.6.  No other abnormal hypermetabolic activity was reported.  # 03/30/2019 Status post IVC filter placement, mechanical thrombectomy.-IVC filter was retrieved in January 2021. # PD-L1 CPS 100%.  # 03/31/2019 started on immunotherapy Keytruda.   INTERVAL HISTORY Angela Russell is a 69 y.o. female who has above history reviewed by me today presents for follow up visit for evaluation form metastatic high-grade urothelial carcinoma of the bladder. Sarcomatoid feature.  Patient continues to have left side of abdominal pain, initially reported as 8 out of 10.  Clarified with her about pain levels and she rated as 4 out of 10, constant achiness.  No exacerbating or alleviating factors. Also noticed a recent blood in the urine.    Review of Systems  Constitutional: Negative for appetite change, chills, fatigue and fever.  HENT:   Negative for hearing loss and voice change.   Eyes: Negative for eye problems.  Respiratory: Negative  for chest tightness and cough.   Cardiovascular: Negative for chest pain and leg swelling.  Gastrointestinal: Positive for abdominal pain. Negative for abdominal distention and blood in stool.  Endocrine: Negative for hot flashes.  Genitourinary: Negative for difficulty urinating and frequency.   Musculoskeletal: Negative for arthralgias.  Skin: Negative for itching and rash.  Neurological: Positive for numbness. Negative for extremity weakness.  Hematological: Negative for adenopathy.  Psychiatric/Behavioral: Negative for confusion. The patient is not nervous/anxious.     MEDICAL HISTORY:  Past Medical History:  Diagnosis Date  . Carotid artery plaque, right 01/2014  . CKD (chronic kidney disease)    stage 2-3  . Diabetes mellitus without complication ([place])   . DM (diabetes mellitus), type 2, uncontrolled ([place])   . Hyperlipidemia   . Hypertension   . Hypochromic microcytic anemia    mild  . Metastatic urothelial carcinoma ([place]) 03/23/2019  . Osteoporosis   . Renal insufficiency   . Rotator cuff tendonitis, right     SURGICAL HISTORY: Past Surgical History:  Procedure Laterality Date  . ABDOMINAL HYSTERECTOMY  2000   due to bleeding and fibroids, partial- still has ovaries  . CYSTOSCOPY W/ RETROGRADES Bilateral 07/16/2018   Procedure: CYSTOSCOPY WITH RETROGRADE PYELOGRAM;  Surgeon: Billey Co, MD;  Location: [facility];  Service: Urology;  Laterality: Bilateral;  . IVC FILTER INSERTION N/A 03/30/2019   Procedure: IVC FILTER INSERTION;  Surgeon: Algernon Huxley, MD;  Location: [place] [facility];  Service: Cardiovascular;  Laterality: N/A;  . IVC FILTER REMOVAL N/A 06/15/2019   Procedure: IVC FILTER REMOVAL;  Surgeon: Algernon Huxley, MD;  Location:  [place] [facility];  Service: Cardiovascular;  Laterality: N/A;  . KNEE SURGERY Left 03/17/2013   torn meniscus  . PERIPHERAL VASCULAR THROMBECTOMY Right 03/30/2019   Procedure: PERIPHERAL VASCULAR THROMBECTOMY;  Surgeon: Algernon Huxley, MD;  Location: [place];  Service: Cardiovascular;  Laterality: Right;  . PORTA CATH INSERTION N/A 08/06/2018   Procedure: PORTA CATH INSERTION;  Surgeon: Algernon Huxley, MD;  Location: [place] [facility];  Service: Cardiovascular;  Laterality: N/A;  . TRANSURETHRAL RESECTION OF BLADDER TUMOR N/A 07/16/2018   Procedure: TRANSURETHRAL RESECTION OF BLADDER TUMOR (TURBT);  Surgeon: Billey Co, MD;  Location: [facility];  Service: Urology;  Laterality: N/A;    SOCIAL HISTORY: Social History   Socioeconomic History  . Marital status: Single    Spouse name: Not on file  . Number of children: 1  . Years of education: Not on file  . Highest education level: 10th grade  Occupational History  . Not on file  Tobacco Use  . Smoking status: Former Smoker    Quit date: 06/05/1991    Years since quitting: 28.3  . Smokeless tobacco: Never Used  Substance and Sexual Activity  . Alcohol use: No  . Drug use: No  . Sexual activity: Never  Other Topics Concern  . Not on file  Social History Narrative   Working full time   Social Determinants of Radio broadcast assistant Strain:   . Difficulty of Paying Living Expenses:   Food Insecurity:   . Worried About Charity fundraiser in the Last Year:   . Arboriculturist in the Last Year:   Transportation Needs:   . Film/video editor (Medical):   Marland Kitchen Lack of Transportation (Non-Medical):   Physical Activity:   . Days of Exercise per Week:   . Minutes of Exercise per Session:   Stress:   . Feeling of Stress :   Social Connections:   . Frequency of Communication with Friends and Family:   . Frequency of Social Gatherings with Friends and Family:   . Attends Religious Services:   . Active Member of Clubs or Organizations:   . Attends Archivist Meetings:   Marland Kitchen Marital Status:   Intimate Partner Violence:   . Fear of Current or Ex-Partner:   . Emotionally Abused:   Marland Kitchen Physically Abused:   . Sexually Abused:      FAMILY HISTORY: Family History  Problem Relation Age of Onset  . Heart disease Mother   . Heart attack Mother   . Arthritis Father   . Diabetes Brother     ALLERGIES:  has No Known Allergies.  MEDICATIONS:  Current Outpatient Medications  Medication Sig Dispense Refill  . amLODipine (NORVASC) 5 MG tablet Take 1 tablet (5 mg total) by mouth daily. 90 tablet 1  . apixaban (ELIQUIS) 5 MG TABS tablet Take 1 tablet (5 mg total) by mouth 2 (two) times daily. 60 tablet 3  . diclofenac sodium (VOLTAREN) 1 % GEL Apply 2 g topically 4 (four) times daily. 100 g 2  . lidocaine-prilocaine (EMLA) cream Apply to affected area once 30 g 3  . polyethylene glycol (MIRALAX / GLYCOLAX) 17 g packet Take 17 g by mouth daily as needed for mild constipation. 14 each 0  . rosuvastatin (CRESTOR) 20 MG tablet Take 1 tablet (20 mg total) by mouth daily. 90 tablet 1   No current facility-administered medications for this visit.     PHYSICAL  EXAMINATION: ECOG PERFORMANCE STATUS: 1 - Symptomatic but completely ambulatory Vitals:   10/06/19 0910  BP: (!) 155/79  Pulse: 80  Resp: 18  Temp: (!) 96 F (35.6 C)   Filed Weights   10/06/19 0910  Weight: 195 lb 3.2 oz (88.5 kg)    Physical Exam Constitutional:      General: She is not in acute distress.    Comments: Walk independently  HENT:     Head: Normocephalic and atraumatic.  Eyes:     General: No scleral icterus.    Pupils: Pupils are equal, round, and reactive to light.  Cardiovascular:     Rate and Rhythm: Normal rate and regular rhythm.     Heart sounds: Normal heart sounds.  Pulmonary:     Effort: Pulmonary effort is normal. No respiratory distress.     Breath sounds: No wheezing.  Abdominal:     General: Bowel sounds are normal. There is no distension.     Palpations: Abdomen is soft. There is no mass.  Musculoskeletal:        General: No deformity. Normal range of motion.     Cervical back: Normal range of motion and neck  supple.  Skin:    General: Skin is warm and dry.     Coloration: Skin is not pale.     Findings: No erythema or rash.     Comments: Ureterostomy, with light yellow color urine in the ostomy bag.  Neurological:     Mental Status: She is alert and oriented to person, place, and time. Mental status is at baseline.     Cranial Nerves: No cranial nerve deficit.     Coordination: Coordination normal.  Psychiatric:        Mood and Affect: Mood normal.     RADIOGRAPHIC STUDIES: I have personally reviewed the radiological images as listed and agreed with the findings in the report.  CMP Latest Ref Rng & Units 10/06/2019  Glucose 70 - 99 mg/dL 142(H)  BUN 8 - 23 mg/dL 37(H)  Creatinine 0.44 - 1.00 mg/dL 1.56(H)  Sodium 135 - 145 mmol/L 141  Potassium 3.5 - 5.1 mmol/L 4.3  Chloride 98 - 111 mmol/L 109  CO2 22 - 32 mmol/L 23  Calcium 8.9 - 10.3 mg/dL 9.1  Total Protein 6.5 - 8.1 g/dL 7.7  Total Bilirubin 0.3 - 1.2 mg/dL 0.5  Alkaline Phos 38 - 126 U/L 110  AST 15 - 41 U/L 19  ALT 0 - 44 U/L 14   CBC Latest Ref Rng & Units 10/06/2019  WBC 4.0 - 10.5 K/uL 5.3  Hemoglobin 12.0 - 15.0 g/dL 9.9(L)  Hematocrit 36.0 - 46.0 % 32.3(L)  Platelets 150 - 400 K/uL 235    LABORATORY DATA:  I have reviewed the data as listed Lab Results  Component Value Date   WBC 5.3 10/06/2019   HGB 9.9 (L) 10/06/2019   HCT 32.3 (L) 10/06/2019   MCV 78.6 (L) 10/06/2019   PLT 235 10/06/2019   Recent Labs    12/19/18 1841 12/20/18 0004 08/25/19 0923 09/15/19 0850 10/06/19 0847  NA 137   < > 139 140 141  K 6.3*   < > 3.9 3.9 4.3  CL 115*   < > 108 106 109  CO2 15*   < > '22 24 23  '$ GLUCOSE 129*   < > 149* 154* 142*  BUN 37*   < > 30* 30* 37*  CREATININE 2.03*   < > 1.43* 1.46* 1.56*  CALCIUM 9.7   < > 9.0 8.9 9.1  GFRNONAA 24*   < > 37* 36* 33*  GFRAA 28*   < > 43* 42* 39*  PROT  --    < > 7.4 7.4 7.7  ALBUMIN  --    < > 4.0 3.9 4.1  AST  --    < > '15 18 19  '$ ALT  --    < > '11 12 14  '$ ALKPHOS  --     < > 98 98 110  BILITOT 0.5   < > 0.7 0.5 0.5  BILIDIR <0.1  --   --   --   --   IBILI NOT CALCULATED  --   --   --   --    < > = values in this interval not displayed.   Iron/TIBC/Ferritin/ %Sat    Component Value Date/Time   IRON 46 03/04/2019 1423   IRON 26 (L) 02/04/2019 1309   TIBC 267 03/04/2019 1423   TIBC 251 02/04/2019 1309   FERRITIN 316 (H) 03/04/2019 1423   FERRITIN 409 (H) 02/04/2019 1309   IRONPCTSAT 17 03/04/2019 1423   IRONPCTSAT 10 (L) 02/04/2019 1309    RADIOGRAPHIC STUDIES: I have personally reviewed the radiological images as listed and agreed with the findings in the report. NM PET Image Restag (PS) Skull Base To Thigh  Result Date: 09/01/2019 CLINICAL DATA:  Subsequent treatment strategy for metastatic urothelial carcinoma with ongoing immunotherapy. History of cystectomy and loop ileostomy. EXAM: NUCLEAR MEDICINE PET SKULL BASE TO THIGH TECHNIQUE: 10.6 mCi F-18 FDG was injected intravenously. Full-ring PET imaging was performed from the skull base to thigh after the radiotracer. CT data was obtained and used for attenuation correction and anatomic localization. Fasting blood glucose: 153 mg/dl COMPARISON:  06/18/2019 PET-CT. FINDINGS: Mediastinal blood pool activity: SUV max 3.0 Liver activity: SUV max NA NECK: No hypermetabolic lymph nodes in the neck. Incidental CT findings: none CHEST: No enlarged or hypermetabolic axillary, mediastinal or hilar lymph nodes. No hypermetabolic pulmonary findings. Incidental CT findings: Non hypermetabolic dense 2.1 cm soft tissue nodule in the midline retro-manubrial high prevascular mediastinum (series 3/image 65), stable, favor an exophytic thyroid nodule. Right internal jugular Port-A-Cath terminates in the right atrium. Three-vessel coronary atherosclerosis. Atherosclerotic nonaneurysmal thoracic aorta. A few new scattered tiny solid pulmonary nodules in the posterior upper lobes bilaterally, largest 4 mm in the left upper lobe  (series 3/image 74), below PET resolution. ABDOMEN/PELVIS: Previously visualized hypermetabolic 1.0 cm right mesenteric node with max SUV 9.6 is decreased in size to 0.6 cm (series 3/image 176) and decreased in metabolism with max SUV 2.1. No enlarged or hypermetabolic lymph nodes in the abdomen or pelvis on today's scan. No abnormal hypermetabolic activity within the liver, pancreas, adrenal glands, or spleen. Incidental CT findings: Stable postsurgical changes from cystectomy and diverting loop ileostomy in the ventral right abdominal wall. Vascular stent noted in right external iliac vein. Bilateral pelvic lymph node dissection clips. Hysterectomy. SKELETON: No focal hypermetabolic activity to suggest skeletal metastasis. Incidental CT findings: none IMPRESSION: 1. Response to therapy. Previously hypermetabolic right mesenteric lymph node is decreased in size and is no longer hypermetabolic. No hypermetabolic metastatic disease on today's scan. 2. Chronic findings include: Aortic Atherosclerosis (ICD10-I70.0). Three-vessel coronary atherosclerosis. Stable non-hypermetabolic retro-manubrial 2.1 cm soft tissue nodule, favor an exophytic thyroid nodule, for which no follow-up is required given patient co-morbidities, nodule stability and difficult location to evaluate by ultrasound. (ref: J Am Coll Radiol. 2015 Feb;12(2): 143-50).  Electronically Signed   By: Ilona Sorrel M.D.   On: 09/01/2019 14:23     ASSESSMENT & PLAN:  1. Encounter for antineoplastic immunotherapy   2. Hemoglobinuria   3. Metastatic urothelial carcinoma ([place])   4. History of DVT of lower extremity   Cancer Staging Bladder carcinoma [facility]) Staging form: Urinary Bladder, AJCC 8th Edition - Clinical stage from 08/01/2018: Stage IVA (cTX, cN3, cM1a) - Signed by [name], MD on 04/21/2019  #Metastatic urothelial carcinoma of bladder, sarcomatoid features pelvic sidewall mass showed metastatic carcinoma consistent with involvement by  urothelial carcinoma. Patient has been on Keytruda every 3 weeks and most recent PET scan at the end of March showed complete remission. Labs reviewed and discussed with patient. Continue Keytruda.  #Patient reports history of " blood" in the urine/dark urine, chronic left side pain. UA was obtained from her ureterostomy bag which showed RBC 6-10, large hemoglobin.  And positive nitrate. I discussed with urology, although urine sample from ileal conduit will be always colonized, I will still go ahead and send a urine culture.  Urology recommends CT urogram.  I discussed with patient and encourage patient to reestablish care with local urologist for further evaluation.  # Fatigue, TSH and cortisol level have been both normal.  Continue monitor. # Chronic anemia, due to CKD.  Hemoglobin has been stable.  Erythropoietin replacement therapy is contraindicated due to cancer. Microcytosis, hemoglobinopathy evaluation was done previously and was normal. She may have alpha thalassemia.  I will check alpha thalassemia genotype.   #Right lower extremity  femoral vein DVT secondary to transvenous biopsy in the context of cancer recurrence Status post IV C filter and mechanical thrombectomy.  IVC filter has been removed. Discussed with patient that her thrombosis event was provoked by transvenous biopsy and she has completed more than 6 months of anticoagulation and her bladder cancer has been in remission. In the context of possible blood in urine, I recommend patient to discontinue Eliquis therapy.  She agrees with the plan.  All questions were answered. The patient knows to call the clinic with any problems questions or concerns.  Return of visit: 3 weeks.    [name], MD, PhD Hematology Oncology [facility] at [facility] Pager- [phone] 10/06/19

## 2019-10-07 ENCOUNTER — Other Ambulatory Visit: Payer: Self-pay

## 2019-10-07 DIAGNOSIS — R823 Hemoglobinuria: Secondary | ICD-10-CM

## 2019-10-08 LAB — URINE CULTURE

## 2019-10-12 ENCOUNTER — Other Ambulatory Visit: Payer: Self-pay

## 2019-10-12 ENCOUNTER — Ambulatory Visit (INDEPENDENT_AMBULATORY_CARE_PROVIDER_SITE_OTHER): Payer: Medicare Other | Admitting: Family Medicine

## 2019-10-12 ENCOUNTER — Encounter: Payer: Self-pay | Admitting: Family Medicine

## 2019-10-12 VITALS — BP 153/78 | HR 79 | Temp 97.9°F | Ht 62.13 in | Wt 195.0 lb

## 2019-10-12 DIAGNOSIS — I6521 Occlusion and stenosis of right carotid artery: Secondary | ICD-10-CM

## 2019-10-12 DIAGNOSIS — I10 Essential (primary) hypertension: Secondary | ICD-10-CM

## 2019-10-12 DIAGNOSIS — R319 Hematuria, unspecified: Secondary | ICD-10-CM | POA: Diagnosis not present

## 2019-10-12 DIAGNOSIS — Z780 Asymptomatic menopausal state: Secondary | ICD-10-CM | POA: Diagnosis not present

## 2019-10-12 DIAGNOSIS — R8271 Bacteriuria: Secondary | ICD-10-CM | POA: Diagnosis not present

## 2019-10-12 DIAGNOSIS — E782 Mixed hyperlipidemia: Secondary | ICD-10-CM | POA: Diagnosis not present

## 2019-10-12 DIAGNOSIS — E118 Type 2 diabetes mellitus with unspecified complications: Secondary | ICD-10-CM

## 2019-10-12 DIAGNOSIS — I152 Hypertension secondary to endocrine disorders: Secondary | ICD-10-CM

## 2019-10-12 DIAGNOSIS — E1159 Type 2 diabetes mellitus with other circulatory complications: Secondary | ICD-10-CM

## 2019-10-12 DIAGNOSIS — R29890 Loss of height: Secondary | ICD-10-CM

## 2019-10-12 MED ORDER — ROSUVASTATIN CALCIUM 20 MG PO TABS: 20.0000 mg | ORAL_TABLET | Freq: Every day | ORAL | 1 refills | Status: DC

## 2019-10-12 MED ORDER — AMLODIPINE BESYLATE 5 MG PO TABS: 5.0000 mg | ORAL_TABLET | Freq: Every day | ORAL | 1 refills | Status: DC

## 2019-10-12 MED ORDER — SULFAMETHOXAZOLE-TRIMETHOPRIM 800-160 MG PO TABS: 1.0000 | ORAL_TABLET | Freq: Two times a day (BID) | ORAL | 0 refills | Status: DC

## 2019-10-12 NOTE — Assessment & Plan Note (Signed)
Mildly elevated today, but home readings typically running WNL. Will continue to monitor closely and may need to increase regimen if not coming down consistently.

## 2019-10-12 NOTE — Assessment & Plan Note (Signed)
Recheck A1C, adjust as needed. Continue lifestyle modifications

## 2019-10-12 NOTE — Progress Notes (Signed)
BP (!) 153/78   Pulse 79   Temp 97.9 F (36.6 C)   Ht 5' 2.13" (1.578 m)   Wt 195 lb (88.5 kg)   SpO2 97%   BMI 35.52 kg/m    Subjective:    Patient ID: Angela Russell, female    DOB: [birth year], 69 y.o.   MRN: [id number]  HPI: [name] is a 69 y.o. female  Chief Complaint  Patient presents with  . Diabetes  . Hypertension   Here today for 6 month f/u chronic conditions.   C/o recent hematuria, cleared up the past day or so. Hx of bladder cancer, now with urostomy in place. Recent labs at cancer center 5/4 show evidence of UTI with culture growing out multiple organisms.   BPs have been high here and there when checked at home. Taking medication faithfully without issue. Denies CP, SOB, HAs, dizziness.    Has not been checking home BSs as her machine is lost but she plans to find it soon. DM is diet controlled currently. Denies low blood sugar spells, polyuria, polydipsia, polyphagia.   On crestor for HLD, trying to eat better and be as active as tolerated.   Recent DVT, completed eliquis. Followed by Hematology. No new sxs or concerns there.   Relevant past medical, surgical, family and social history reviewed and updated as indicated. Interim medical history since our last visit reviewed. Allergies and medications reviewed and updated.  Review of Systems  Per HPI unless specifically indicated above     Objective:    BP (!) 153/78   Pulse 79   Temp 97.9 F (36.6 C)   Ht 5' 2.13" (1.578 m)   Wt 195 lb (88.5 kg)   SpO2 97%   BMI 35.52 kg/m   Wt Readings from Last 3 Encounters:  10/12/19 195 lb (88.5 kg)  10/06/19 195 lb 3.2 oz (88.5 kg)  09/15/19 195 lb 14.4 oz (88.9 kg)    Physical Exam Vitals and nursing note reviewed.  Constitutional:      Appearance: Normal appearance. She is not ill-appearing.  HENT:     Head: Atraumatic.  Eyes:     Extraocular Movements: Extraocular movements intact.     Conjunctiva/sclera: Conjunctivae normal.    Cardiovascular:     Rate and Rhythm: Normal rate and regular rhythm.     Heart sounds: Normal heart sounds.  Pulmonary:     Effort: Pulmonary effort is normal.     Breath sounds: Normal breath sounds.  Abdominal:     General: Bowel sounds are normal. There is no distension.     Palpations: Abdomen is soft.     Tenderness: There is no abdominal tenderness.  Musculoskeletal:        General: Normal range of motion.     Cervical back: Normal range of motion and neck supple.  Skin:    General: Skin is warm and dry.  Neurological:     Mental Status: She is alert and oriented to person, place, and time.  Psychiatric:        Mood and Affect: Mood normal.        Thought Content: Thought content normal.        Judgment: Judgment normal.     Results for orders placed or performed in visit on 10/07/19  Urine culture   Specimen: Urine, Clean Catch  Result Value Ref Range   Specimen Description      URINE, CLEAN CATCH Performed at [facility],  [place], [place] [address]    Special Requests      NONE Performed at [facility], [address] [place], [place] [address]    Culture MULTIPLE SPECIES PRESENT, SUGGEST RECOLLECTION (A)    Report Status 10/08/2019 FINAL       Assessment & Plan:   Problem List Items Addressed This Visit      Cardiovascular and Mediastinum   Hypertension associated with diabetes ([place]) - Primary    Mildly elevated today, but home readings typically running WNL. Will continue to monitor closely and may need to increase regimen if not coming down consistently.       Relevant Medications   rosuvastatin (CRESTOR) 20 MG tablet   amLODipine (NORVASC) 5 MG tablet   Other Relevant Orders   UA/M w/rflx Culture, Routine     Endocrine   Controlled diabetes mellitus type 2 with complications ([place])    Recheck A1C, adjust as needed. Continue lifestyle modifications      Relevant Medications   rosuvastatin (CRESTOR) 20 MG tablet    Other Relevant Orders   HgB A1c     Other   Hyperlipidemia    Recheck lipids, adjust as needed. Continue diet and exercise      Relevant Medications   rosuvastatin (CRESTOR) 20 MG tablet   amLODipine (NORVASC) 5 MG tablet   Other Relevant Orders   Lipid Panel w/o Chol/HDL Ratio    Other Visit Diagnoses    Postmenopausal estrogen deficiency       Relevant Orders   DG Bone Density   Loss of height       Relevant Orders   DG Bone Density   Hematuria, unspecified type       Recheck urine given nonspecific culture, has not been treated for UTI. WIll start bactrim given previous results and sxs and monitor for benefit       Follow up plan: Return in about 6 months (around 04/13/2020) for 6 month f/u.

## 2019-10-12 NOTE — Assessment & Plan Note (Signed)
Recheck lipids, adjust as needed. Continue diet and exercise

## 2019-10-13 ENCOUNTER — Ambulatory Visit (INDEPENDENT_AMBULATORY_CARE_PROVIDER_SITE_OTHER): Payer: Medicare Other | Admitting: Nurse Practitioner

## 2019-10-13 ENCOUNTER — Encounter (INDEPENDENT_AMBULATORY_CARE_PROVIDER_SITE_OTHER): Payer: Self-pay | Admitting: Nurse Practitioner

## 2019-10-13 VITALS — BP 167/78 | HR 78 | Ht 63.0 in | Wt 194.0 lb

## 2019-10-13 DIAGNOSIS — E782 Mixed hyperlipidemia: Secondary | ICD-10-CM

## 2019-10-13 DIAGNOSIS — Z86718 Personal history of other venous thrombosis and embolism: Secondary | ICD-10-CM

## 2019-10-13 DIAGNOSIS — I152 Hypertension secondary to endocrine disorders: Secondary | ICD-10-CM

## 2019-10-13 DIAGNOSIS — M79604 Pain in right leg: Secondary | ICD-10-CM | POA: Diagnosis not present

## 2019-10-13 DIAGNOSIS — E1159 Type 2 diabetes mellitus with other circulatory complications: Secondary | ICD-10-CM

## 2019-10-13 DIAGNOSIS — I10 Essential (primary) hypertension: Secondary | ICD-10-CM | POA: Diagnosis not present

## 2019-10-13 LAB — LIPID PANEL W/O CHOL/HDL RATIO
Cholesterol, Total: 190 mg/dL (ref 100–199)
HDL: 67 mg/dL (ref >39)
LDL Chol Calc (NIH): 105 mg/dL — ABNORMAL HIGH (ref 0–99)
Triglycerides: 100 mg/dL (ref 0–149)
VLDL Cholesterol Cal: 18 mg/dL (ref 5–40)

## 2019-10-13 LAB — HEMOGLOBIN A1C
Est. average glucose Bld gHb Est-mCnc: 157 mg/dL
Hgb A1c MFr Bld: 7.1 % — ABNORMAL HIGH (ref 4.8–5.6)

## 2019-10-14 LAB — UA/M W/RFLX CULTURE, ROUTINE
Bilirubin, UA: NEGATIVE
Glucose, UA: NEGATIVE
Ketones, UA: NEGATIVE
Nitrite, UA: POSITIVE — AB
Specific Gravity, UA: 1.015 (ref 1.005–1.030)
Urobilinogen, Ur: 0.2 mg/dL (ref 0.2–1.0)
pH, UA: 7.5 (ref 5.0–7.5)

## 2019-10-14 LAB — URINE CULTURE, REFLEX

## 2019-10-14 LAB — MICROSCOPIC EXAMINATION

## 2019-10-16 ENCOUNTER — Encounter (INDEPENDENT_AMBULATORY_CARE_PROVIDER_SITE_OTHER): Payer: Self-pay | Admitting: Nurse Practitioner

## 2019-10-16 NOTE — Progress Notes (Signed)
Subjective:    Patient ID: Angela Russell, female    DOB: 10-May-1951, 69 y.o.   MRN: [id number] Chief Complaint  Patient presents with  . Follow-up    LE pain    The patient presents today with complaints of right lower extremity leg pain.  The patient previously had a DVT of the right iliofemoral system.  The patient had an IVC filter placed as well as stent placement in the right iliac vein.  She subsequently had her IVC filter removed on 06/15/2019.  During IVC filter removal a venogram was done which showed that her IVC filter was widely patent with no thrombus.  Her right iliac stent was also widely patent.  The patient is concerned because she has a sharp groin pain that has been going on for the last 2 to 3 weeks.  It is slowly been getting worse.  She is concerned this could be related to the stent that is placed in her right iliac vein.  She is unable to palpate anything in that area.  She also notes that her right foot is tender at the top as well as at the center.  She denies any claudication-like symptoms.  She denies any rest pain like symptoms.  She denies any chest pain or shortness of breath.  She denies any lower extremity edema.   Review of Systems  Musculoskeletal:       Groin pain, foot soreness  All other systems reviewed and are negative.      Objective:   Physical Exam Vitals reviewed.  HENT:     Head: Normocephalic.  Cardiovascular:     Rate and Rhythm: Normal rate and regular rhythm.     Pulses: Decreased pulses.  Abdominal:     General: The ostomy site is clean.  Skin:    General: Skin is warm and dry.  Neurological:     Mental Status: She is alert and oriented to person, place, and time.  Psychiatric:        Mood and Affect: Mood normal.        Behavior: Behavior normal.        Thought Content: Thought content normal.        Judgment: Judgment normal.     BP (!) 167/78   Pulse 78   Ht 5\' 3"  (1.6 m)   Wt 194 lb (88 kg)   BMI 34.37 kg/m    Past Medical History:  Diagnosis Date  . Carotid artery plaque, right 01/2014  . CKD (chronic kidney disease)    stage 2-3  . Diabetes mellitus without complication ([place])   . DM (diabetes mellitus), type 2, uncontrolled ([place])   . Hyperlipidemia   . Hypertension   . Hypochromic microcytic anemia    mild  . Metastatic urothelial carcinoma ([place]) 03/23/2019  . Osteoporosis   . Renal insufficiency   . Rotator cuff tendonitis, right     Social History   Socioeconomic History  . Marital status: Single    Spouse name: Not on file  . Number of children: 1  . Years of education: Not on file  . Highest education level: 10th grade  Occupational History  . Not on file  Tobacco Use  . Smoking status: Former Smoker    Quit date: 06/05/1991    Years since quitting: 28.3  . Smokeless tobacco: Never Used  Substance and Sexual Activity  . Alcohol use: No  . Drug use: No  . Sexual activity: Never  Other  Topics Concern  . Not on file  Social History Narrative   Working full time   Social Determinants of Radio broadcast assistant Strain:   . Difficulty of Paying Living Expenses:   Food Insecurity:   . Worried About Charity fundraiser in the Last Year:   . Arboriculturist in the Last Year:   Transportation Needs:   . Film/video editor (Medical):   Marland Kitchen Lack of Transportation (Non-Medical):   Physical Activity:   . Days of Exercise per Week:   . Minutes of Exercise per Session:   Stress:   . Feeling of Stress :   Social Connections:   . Frequency of Communication with Friends and Family:   . Frequency of Social Gatherings with Friends and Family:   . Attends Religious Services:   . Active Member of Clubs or Organizations:   . Attends Archivist Meetings:   Marland Kitchen Marital Status:   Intimate Partner Violence:   . Fear of Current or Ex-Partner:   . Emotionally Abused:   Marland Kitchen Physically Abused:   . Sexually Abused:     Past Surgical History:  Procedure Laterality  Date  . ABDOMINAL HYSTERECTOMY  2000   due to bleeding and fibroids, partial- still has ovaries  . CYSTOSCOPY W/ RETROGRADES Bilateral 07/16/2018   Procedure: CYSTOSCOPY WITH RETROGRADE PYELOGRAM;  Surgeon: Billey Co, MD;  Location: [facility];  Service: Urology;  Laterality: Bilateral;  . IVC FILTER INSERTION N/A 03/30/2019   Procedure: IVC FILTER INSERTION;  Surgeon: Algernon Huxley, MD;  Location: [facility];  Service: Cardiovascular;  Laterality: N/A;  . IVC FILTER REMOVAL N/A 06/15/2019   Procedure: IVC FILTER REMOVAL;  Surgeon: Algernon Huxley, MD;  Location: [facility];  Service: Cardiovascular;  Laterality: N/A;  . KNEE SURGERY Left 03/17/2013   torn meniscus  . PERIPHERAL VASCULAR THROMBECTOMY Right 03/30/2019   Procedure: PERIPHERAL VASCULAR THROMBECTOMY;  Surgeon: Algernon Huxley, MD;  Location: [facility];  Service: Cardiovascular;  Laterality: Right;  . PORTA CATH INSERTION N/A 08/06/2018   Procedure: PORTA CATH INSERTION;  Surgeon: Algernon Huxley, MD;  Location: [facility];  Service: Cardiovascular;  Laterality: N/A;  . TRANSURETHRAL RESECTION OF BLADDER TUMOR N/A 07/16/2018   Procedure: TRANSURETHRAL RESECTION OF BLADDER TUMOR (TURBT);  Surgeon: Billey Co, MD;  Location: [facility];  Service: Urology;  Laterality: N/A;    Family History  Problem Relation Age of Onset  . Heart disease Mother   . Heart attack Mother   . Arthritis Father   . Diabetes Brother     No Known Allergies     Assessment & Plan:   1. History of DVT of lower extremity We will get noninvasive imaging of the patient's iliac veins to ensure patency of her right iliac vein as well as to ensure that there has been no damage to the stent placement.  Otherwise patient will continue with current medications.  - VAS [place] IVC/ILIAC (VENOUS ONLY); Future  2. Pain of right lower extremity  Recommend:  The patient has atypical pain symptoms for pure atherosclerotic  disease. However, on physical exam there is evidence of mixed venous and arterial disease, given the diminished pulses and the pedal edema.  Noninvasive studies including ABI's and venous ultrasound of the legs will be obtained and the patient will follow up with me to review these studies.  I suspect the patient is c/o pseudoclaudication.  Patient should have an evaluation of his LS spine which I defer to the primary service.  The patient should continue walking and begin a more formal exercise program. The patient should continue his antiplatelet therapy and aggressive treatment of the lipid abnormalities.  The patient should begin wearing graduated compression socks 15-20 mmHg strength to control edema.  - VAS [place] ABI WITH/WO TBI; Future  3. Hypertension associated with diabetes ([place]) Continue antihypertensive medications as already ordered, these medications have been reviewed and there are no changes at this time.   4. Mixed hyperlipidemia Continue statin as ordered and reviewed, no changes at this time    Current Outpatient Medications on File Prior to Visit  Medication Sig Dispense Refill  . amLODipine (NORVASC) 5 MG tablet Take 1 tablet (5 mg total) by mouth daily. 90 tablet 1  . diclofenac sodium (VOLTAREN) 1 % GEL Apply 2 g topically 4 (four) times daily. 100 g 2  . lidocaine-prilocaine (EMLA) cream Apply to affected area once 30 g 3  . polyethylene glycol (MIRALAX / GLYCOLAX) 17 g packet Take 17 g by mouth daily as needed for mild constipation. 14 each 0  . rosuvastatin (CRESTOR) 20 MG tablet Take 1 tablet (20 mg total) by mouth daily. 90 tablet 1  . sulfamethoxazole-trimethoprim (BACTRIM DS) 800-160 MG tablet Take 1 tablet by mouth 2 (two) times daily. (Patient not taking: Reported on 10/13/2019) 10 tablet 0  . [DISCONTINUED] prochlorperazine (COMPAZINE) 10 MG tablet Take 1 tablet (10 mg total) by mouth every 6 (six) hours as needed (Nausea or vomiting). 30 tablet 1   No  current facility-administered medications on file prior to visit.    There are no Patient Instructions on file for this visit. No follow-ups on file.   Kris Hartmann, NP

## 2019-10-18 ENCOUNTER — Other Ambulatory Visit: Payer: Self-pay | Admitting: Family Medicine

## 2019-10-18 MED ORDER — ROSUVASTATIN CALCIUM 40 MG PO TABS: 40.0000 mg | ORAL_TABLET | Freq: Every day | ORAL | 1 refills | Status: DC

## 2019-10-20 NOTE — Progress Notes (Signed)
Pharmacist Chemotherapy Monitoring - Follow Up Assessment    I verify that I have reviewed each item in the below checklist:  . Regimen for the patient is scheduled for the appropriate day and plan matches scheduled date. Marland Kitchen Appropriate non-routine labs are ordered dependent on drug ordered. . If applicable, additional medications reviewed and ordered per protocol based on lifetime cumulative doses and/or treatment regimen.   Plan for follow-up and/or issues identified: No . I-vent associated with next due treatment: No . MD and/or nursing notified: No  Flora Ratz K 10/20/2019 8:30 AM

## 2019-10-27 ENCOUNTER — Encounter: Payer: Self-pay | Admitting: Oncology

## 2019-10-27 ENCOUNTER — Inpatient Hospital Stay: Payer: Medicare Other

## 2019-10-27 ENCOUNTER — Inpatient Hospital Stay (HOSPITAL_BASED_OUTPATIENT_CLINIC_OR_DEPARTMENT_OTHER): Payer: Medicare Other | Admitting: Oncology

## 2019-10-27 ENCOUNTER — Other Ambulatory Visit: Payer: Self-pay

## 2019-10-27 VITALS — BP 153/66 | HR 87 | Temp 95.5°F | Resp 18 | Wt 196.8 lb

## 2019-10-27 DIAGNOSIS — Z5112 Encounter for antineoplastic immunotherapy: Secondary | ICD-10-CM

## 2019-10-27 DIAGNOSIS — Z86718 Personal history of other venous thrombosis and embolism: Secondary | ICD-10-CM

## 2019-10-27 DIAGNOSIS — N1831 Chronic kidney disease, stage 3a: Secondary | ICD-10-CM

## 2019-10-27 DIAGNOSIS — C7910 Secondary malignant neoplasm of unspecified urinary organs: Secondary | ICD-10-CM

## 2019-10-27 DIAGNOSIS — M7989 Other specified soft tissue disorders: Secondary | ICD-10-CM | POA: Diagnosis not present

## 2019-10-27 DIAGNOSIS — R911 Solitary pulmonary nodule: Secondary | ICD-10-CM | POA: Diagnosis not present

## 2019-10-27 DIAGNOSIS — C679 Malignant neoplasm of bladder, unspecified: Secondary | ICD-10-CM

## 2019-10-27 DIAGNOSIS — D631 Anemia in chronic kidney disease: Secondary | ICD-10-CM

## 2019-10-27 DIAGNOSIS — N179 Acute kidney failure, unspecified: Secondary | ICD-10-CM

## 2019-10-27 DIAGNOSIS — R823 Hemoglobinuria: Secondary | ICD-10-CM | POA: Diagnosis not present

## 2019-10-27 DIAGNOSIS — D509 Iron deficiency anemia, unspecified: Secondary | ICD-10-CM

## 2019-10-27 DIAGNOSIS — Z95828 Presence of other vascular implants and grafts: Secondary | ICD-10-CM

## 2019-10-27 LAB — COMPREHENSIVE METABOLIC PANEL
ALT: 14 U/L (ref 0–44)
AST: 22 U/L (ref 15–41)
Albumin: 4 g/dL (ref 3.5–5.0)
Alkaline Phosphatase: 96 U/L (ref 38–126)
Anion gap: 9 (ref 5–15)
BUN: 39 mg/dL — ABNORMAL HIGH (ref 8–23)
CO2: 22 mmol/L (ref 22–32)
Calcium: 9 mg/dL (ref 8.9–10.3)
Chloride: 108 mmol/L (ref 98–111)
Creatinine, Ser: 1.71 mg/dL — ABNORMAL HIGH (ref 0.44–1.00)
GFR calc Af Amer: 35 mL/min — ABNORMAL LOW (ref >60)
GFR calc non Af Amer: 30 mL/min — ABNORMAL LOW (ref >60)
Glucose, Bld: 232 mg/dL — ABNORMAL HIGH (ref 70–99)
Potassium: 4.4 mmol/L (ref 3.5–5.1)
Sodium: 139 mmol/L (ref 135–145)
Total Bilirubin: 0.5 mg/dL (ref 0.3–1.2)
Total Protein: 7.4 g/dL (ref 6.5–8.1)

## 2019-10-27 LAB — CBC WITH DIFFERENTIAL/PLATELET
Abs Immature Granulocytes: 0.02 10*3/uL (ref 0.00–0.07)
Basophils Absolute: 0 10*3/uL (ref 0.0–0.1)
Basophils Relative: 1 %
Eosinophils Absolute: 0.1 10*3/uL (ref 0.0–0.5)
Eosinophils Relative: 3 %
HCT: 29.8 % — ABNORMAL LOW (ref 36.0–46.0)
Hemoglobin: 9.5 g/dL — ABNORMAL LOW (ref 12.0–15.0)
Immature Granulocytes: 0 %
Lymphocytes Relative: 23 %
Lymphs Abs: 1.1 10*3/uL (ref 0.7–4.0)
MCH: 24.7 pg — ABNORMAL LOW (ref 26.0–34.0)
MCHC: 31.9 g/dL (ref 30.0–36.0)
MCV: 77.6 fL — ABNORMAL LOW (ref 80.0–100.0)
Monocytes Absolute: 0.4 10*3/uL (ref 0.1–1.0)
Monocytes Relative: 8 %
Neutro Abs: 3 10*3/uL (ref 1.7–7.7)
Neutrophils Relative %: 65 %
Platelets: 203 10*3/uL (ref 150–400)
RBC: 3.84 MIL/uL — ABNORMAL LOW (ref 3.87–5.11)
RDW: 13.9 % (ref 11.5–15.5)
WBC: 4.7 10*3/uL (ref 4.0–10.5)
nRBC: 0 % (ref 0.0–0.2)

## 2019-10-27 MED ORDER — HEPARIN SOD (PORK) LOCK FLUSH 100 UNIT/ML IV SOLN
INTRAVENOUS | Status: AC
  Filled 2019-10-27: qty 5

## 2019-10-27 MED ORDER — SODIUM CHLORIDE 0.9 % IV SOLN
200.0000 mg | Freq: Once | INTRAVENOUS | Status: AC
  Administered 2019-10-27: 200 mg via INTRAVENOUS
  Filled 2019-10-27: qty 8

## 2019-10-27 MED ORDER — SODIUM CHLORIDE 0.9 % IV SOLN
Freq: Once | INTRAVENOUS | Status: AC
  Filled 2019-10-27: qty 250

## 2019-10-27 MED ORDER — HEPARIN SOD (PORK) LOCK FLUSH 100 UNIT/ML IV SOLN
500.0000 [IU] | Freq: Once | INTRAVENOUS | Status: AC | PRN
  Administered 2019-10-27: 500 [IU]
  Filled 2019-10-27: qty 5

## 2019-10-27 MED ORDER — SODIUM CHLORIDE 0.9% FLUSH
10.0000 mL | INTRAVENOUS | Status: DC | PRN
  Administered 2019-10-27: 10 mL via INTRAVENOUS
  Filled 2019-10-27: qty 10

## 2019-10-27 NOTE — Progress Notes (Signed)
Pt here for follow up. No new concerns voiced.   

## 2019-10-27 NOTE — Progress Notes (Signed)
Hematology/Oncology follow up note [facility] [phone] [phone]   Patient Care Team: Lane, Rachel Elizabeth, PA-C as PCP - General (Family Medicine) Nice, Keith B, OD (Optometry) Tambria Pfannenstiel, MD as Consulting Physician (Hematology and Oncology)  REFERRING PROVIDER: Dr.Sninsky CHIEF COMPLAINTS/REASON FOR VISIT:  Follow up for bladder cancer, anemia.   HISTORY OF PRESENTING ILLNESS:  Angela Russell is a  69 y.o.  female with PMH listed below who was referred to me for evaluation of newly diagnosed bladder cancer. Patient initially presented to emergency room at the end of January 2020 for evaluation of dysuria, hematuria and the left lower quadrant inguinal pain and flank pain.  1/28 2020 CT renal stone study showed suspected irregular wall thickening about the superior bladder, not well assessed due to degree of bladder distention.  Recommend cystoscopy for further evaluation.  No renal stone or obstructive uropathy.  Patient was given IV Rocephin and referred patient for outpatient urology follow-up.  Urine culture was negative.  She again presented to ER after 2 days with similar symptoms.  Patient has 25-pack-year smoking history, quit approximately 20 years ago.  No family history of any urology malignancies 07/03/2018 another CT abdomen pelvis with contrast was done which showed no nephrolithiasis or hydronephrosis is identified.  Bladder is decompressed limiting evaluation.  07/16/2018.urology Dr. Sinskey - cystoscopy and bilateral retrograde pyelogram on 07/16/2018.  Pyelogram did not show any filling defect or abnormalities.  No hydronephrosis.  Ureteral orifice was not involved with tumor.  There is a large 5 cm posterior wall bladder tumor, bullous and sessile appearing.  Patient underwent TURBT.   Pathology: High-grade urothelial carcinoma, invasive into muscularis propria.  Lymphovascular invasion is present.  Carcinoma in situ is also  identified.  Focal squamous differentiation is noted, areas of invasive carcinoma display pleomorphic/sarcomatoid changes.  T2b  08/07/2018 CT without contrast negative.  2 subpleural right upper lobe nodule 2 to 3 mm likely benign. She also had baseline audiometry done.  08/04/2018 ddMVAC x 1 cycle, stopped due to intolerance and AKI.  Patient received 1 cycle of dd MVAC, not able to tolerate due to AKI. Patient then was referred to [facility]  09/22/2018 patient underwent a cystectomy, pathology pT3a N0 Mx.  11 lymph nodes were harvested and was all negative. Invasive urothelia carcinoma, high grade, with sarcomatoid features.   10/14/2018 patient was admitted due to pyelonephritis and a pelvic fluid collection.  Drain was placed. 10/23/2018 drain was removed. Patient has had difficulties getting to her appointments to [facility]. 02/09/2019, patient presented with abdominal pain. CT concerning for small bowel obstruction and increased size of right pelvic fluid collection concerning for cancer recurrence.  Right hydronephrosis to the level of pelvis and enlarged lymph nodes. Patient had JP drain placed with CT guidance to pelvic fluid collection and drained 400 cc amber fluid.  Culture was negative for growth of microorganisms and cytology was negative for malignancy.-JP drain was removed on the day of discharge. CT-guided core biopsy of pelvic lymph node adenopathy was attempted but not successful.  # Right lower extremity DVT, provoked by Transvenous biopsy  02/26/2019 transvenous biopsy by IR unsuccessful transvenous biopsy of the right pelvis mass. Post biopsy acute thrombus in the right external iliac and common femoral vein.  Patient was recommended to start anticoagulation with Xarelto however due to the co-pay, patient is not able to afford the medication. Anticoagulation regimen was switched to Eliquis 5 mg.  Patient reports that she has been taking   it once a day. 03/20/2019 PET showed  FDG avid tissue in the cystectomy bed, retroperitoneal and pelvic lymphadenopathy.  Right common iliac DVT.  # increased right lower extremity swelling.  She was started on Eliquis for anticoagulation by [facility].  We clarified with her pharmacy and she was actually taking Eliquis 2.5 mg twice daily. Right lower extremity swelling has not improved but instead worsened. 03/16/2019 She had ultrasound right lower extremity done which showed persistent extensive proximal right lower extremity DVT.  Anticoagulation regimen has increased to Eliquis 5 mg twice daily.  # establish care with [facility] oncology Dr. [name] for evaluation.  Dr. [name] recommended starting immunotherapy with PD-L1 inhibitor Pembrolizumab 200 mg every 3 weeks.  The sarcomatoid histology may not respond to chemotherapy well, could portend a better chance of response into immunotherapy PET scan after 4 cycles of Keytruda showed single mildly enlarged central mesenteric lymph node in the upper pelvis with SUV 9.6.  No other abnormal hypermetabolic activity was reported.  # 03/30/2019 Status post IVC filter placement, mechanical thrombectomy.-IVC filter was retrieved in January 2021. # PD-L1 CPS 100%.  # 03/31/2019 started on immunotherapy Keytruda.   INTERVAL HISTORY Angela Russell is a 69 y.o. female who has above history reviewed by me today presents for follow up visit for evaluation form metastatic high-grade urothelial carcinoma of the bladder. Sarcomatoid feature.  Patient continues to have lower abdomen discomfort, bandlike.  She feels that it is not pain but soreness.  She really did 5 out of 10.  No exacerbating or alleviating factors.  She is constipated and use MiraLAX.  To get bowel movements.    Review of Systems  Constitutional: Negative for appetite change, chills, fatigue and fever.  HENT:   Negative for hearing loss and voice change.   Eyes: Negative for eye problems.  Respiratory: Negative for chest tightness  and cough.   Cardiovascular: Negative for chest pain and leg swelling.  Gastrointestinal: Positive for abdominal pain. Negative for abdominal distention and blood in stool.  Endocrine: Negative for hot flashes.  Genitourinary: Negative for difficulty urinating and frequency.   Musculoskeletal: Negative for arthralgias.  Skin: Negative for itching and rash.  Neurological: Positive for numbness. Negative for extremity weakness.  Hematological: Negative for adenopathy.  Psychiatric/Behavioral: Negative for confusion. The patient is not nervous/anxious.     MEDICAL HISTORY:  Past Medical History:  Diagnosis Date  . Carotid artery plaque, right 01/2014  . CKD (chronic kidney disease)    stage 2-3  . Diabetes mellitus without complication ([place])   . DM (diabetes mellitus), type 2, uncontrolled ([place])   . Hyperlipidemia   . Hypertension   . Hypochromic microcytic anemia    mild  . Metastatic urothelial carcinoma ([place]) 03/23/2019  . Osteoporosis   . Renal insufficiency   . Rotator cuff tendonitis, right     SURGICAL HISTORY: Past Surgical History:  Procedure Laterality Date  . ABDOMINAL HYSTERECTOMY  2000   due to bleeding and fibroids, partial- still has ovaries  . CYSTOSCOPY W/ RETROGRADES Bilateral 07/16/2018   Procedure: CYSTOSCOPY WITH RETROGRADE PYELOGRAM;  Surgeon: Billey Co, MD;  Location: [facility] ORS;  Service: Urology;  Laterality: Bilateral;  . IVC FILTER INSERTION N/A 03/30/2019   Procedure: IVC FILTER INSERTION;  Surgeon: Algernon Huxley, MD;  Location: [place] CV LAB;  Service: Cardiovascular;  Laterality: N/A;  . IVC FILTER REMOVAL N/A 06/15/2019   Procedure: IVC FILTER REMOVAL;  Surgeon: Algernon Huxley, MD;  Location: [place] INVASIVE CV  LAB;  Service: Cardiovascular;  Laterality: N/A;  . KNEE SURGERY Left 03/17/2013   torn meniscus  . PERIPHERAL VASCULAR THROMBECTOMY Right 03/30/2019   Procedure: PERIPHERAL VASCULAR THROMBECTOMY;  Surgeon: Algernon Huxley, MD;   Location: [place] CV LAB;  Service: Cardiovascular;  Laterality: Right;  . PORTA CATH INSERTION N/A 08/06/2018   Procedure: PORTA CATH INSERTION;  Surgeon: Algernon Huxley, MD;  Location: [place] CV LAB;  Service: Cardiovascular;  Laterality: N/A;  . TRANSURETHRAL RESECTION OF BLADDER TUMOR N/A 07/16/2018   Procedure: TRANSURETHRAL RESECTION OF BLADDER TUMOR (TURBT);  Surgeon: Billey Co, MD;  Location: [facility] ORS;  Service: Urology;  Laterality: N/A;    SOCIAL HISTORY: Social History   Socioeconomic History  . Marital status: Single    Spouse name: Not on file  . Number of children: 1  . Years of education: Not on file  . Highest education level: 10th grade  Occupational History  . Not on file  Tobacco Use  . Smoking status: Former Smoker    Quit date: 06/05/1991    Years since quitting: 28.4  . Smokeless tobacco: Never Used  Substance and Sexual Activity  . Alcohol use: No  . Drug use: No  . Sexual activity: Never  Other Topics Concern  . Not on file  Social History Narrative   Working full time   Social Determinants of Radio broadcast assistant Strain:   . Difficulty of Paying Living Expenses:   Food Insecurity:   . Worried About Charity fundraiser in the Last Year:   . Arboriculturist in the Last Year:   Transportation Needs:   . Film/video editor (Medical):   Marland Kitchen Lack of Transportation (Non-Medical):   Physical Activity:   . Days of Exercise per Week:   . Minutes of Exercise per Session:   Stress:   . Feeling of Stress :   Social Connections:   . Frequency of Communication with Friends and Family:   . Frequency of Social Gatherings with Friends and Family:   . Attends Religious Services:   . Active Member of Clubs or Organizations:   . Attends Archivist Meetings:   Marland Kitchen Marital Status:   Intimate Partner Violence:   . Fear of Current or Ex-Partner:   . Emotionally Abused:   Marland Kitchen Physically Abused:   . Sexually Abused:     FAMILY  HISTORY: Family History  Problem Relation Age of Onset  . Heart disease Mother   . Heart attack Mother   . Arthritis Father   . Diabetes Brother     ALLERGIES:  has No Known Allergies.  MEDICATIONS:  Current Outpatient Medications  Medication Sig Dispense Refill  . amLODipine (NORVASC) 5 MG tablet Take 1 tablet (5 mg total) by mouth daily. 90 tablet 1  . diclofenac sodium (VOLTAREN) 1 % GEL Apply 2 g topically 4 (four) times daily. 100 g 2  . lidocaine-prilocaine (EMLA) cream Apply to affected area once 30 g 3  . polyethylene glycol (MIRALAX / GLYCOLAX) 17 g packet Take 17 g by mouth daily as needed for mild constipation. 14 each 0  . rosuvastatin (CRESTOR) 40 MG tablet Take 1 tablet (40 mg total) by mouth daily. 90 tablet 1  . sulfamethoxazole-trimethoprim (BACTRIM DS) 800-160 MG tablet Take 1 tablet by mouth 2 (two) times daily. (Patient not taking: Reported on 10/13/2019) 10 tablet 0   No current facility-administered medications for this visit.   Facility-Administered Medications Ordered  in Other Visits  Medication Dose Route Frequency Provider Last Rate Last Admin  . heparin lock flush 100 unit/mL  500 Units Intracatheter Once PRN [name], MD      . sodium chloride flush (NS) 0.9 % injection 10 mL  10 mL Intravenous PRN [name], MD   10 mL at 10/27/19 0849     PHYSICAL EXAMINATION: ECOG PERFORMANCE STATUS: 1 - Symptomatic but completely ambulatory Vitals:   10/27/19 0900  BP: (!) 153/66  Pulse: 87  Resp: 18  Temp: (!) 95.5 F (35.3 C)   Filed Weights   10/27/19 0900  Weight: 196 lb 12.8 oz (89.3 kg)    Physical Exam Constitutional:      General: She is not in acute distress.    Comments: Walk independently  HENT:     Head: Normocephalic and atraumatic.  Eyes:     General: No scleral icterus.    Pupils: Pupils are equal, round, and reactive to light.  Cardiovascular:     Rate and Rhythm: Normal rate and regular rhythm.     Heart sounds: Normal heart  sounds.  Pulmonary:     Effort: Pulmonary effort is normal. No respiratory distress.     Breath sounds: No wheezing.  Abdominal:     General: Bowel sounds are normal. There is no distension.     Palpations: Abdomen is soft. There is no mass.  Musculoskeletal:        General: No deformity. Normal range of motion.     Cervical back: Normal range of motion and neck supple.  Skin:    General: Skin is warm and dry.     Coloration: Skin is not pale.     Findings: No erythema or rash.     Comments: Ureterostomy, with light yellow color urine in the ostomy bag.  Neurological:     Mental Status: She is alert and oriented to person, place, and time. Mental status is at baseline.     Cranial Nerves: No cranial nerve deficit.     Coordination: Coordination normal.  Psychiatric:        Mood and Affect: Mood normal.     RADIOGRAPHIC STUDIES: I have personally reviewed the radiological images as listed and agreed with the findings in the report.  CMP Latest Ref Rng & Units 10/27/2019  Glucose 70 - 99 mg/dL 232(H)  BUN 8 - 23 mg/dL 39(H)  Creatinine 0.44 - 1.00 mg/dL 1.71(H)  Sodium 135 - 145 mmol/L 139  Potassium 3.5 - 5.1 mmol/L 4.4  Chloride 98 - 111 mmol/L 108  CO2 22 - 32 mmol/L 22  Calcium 8.9 - 10.3 mg/dL 9.0  Total Protein 6.5 - 8.1 g/dL 7.4  Total Bilirubin 0.3 - 1.2 mg/dL 0.5  Alkaline Phos 38 - 126 U/L 96  AST 15 - 41 U/L 22  ALT 0 - 44 U/L 14   CBC Latest Ref Rng & Units 10/27/2019  WBC 4.0 - 10.5 K/uL 4.7  Hemoglobin 12.0 - 15.0 g/dL 9.5(L)  Hematocrit 36.0 - 46.0 % 29.8(L)  Platelets 150 - 400 K/uL 203    LABORATORY DATA:  I have reviewed the data as listed Lab Results  Component Value Date   WBC 4.7 10/27/2019   HGB 9.5 (L) 10/27/2019   HCT 29.8 (L) 10/27/2019   MCV 77.6 (L) 10/27/2019   PLT 203 10/27/2019   Recent Labs    12/19/18 1841 12/20/18 0004 09/15/19 0850 10/06/19 0847 10/27/19 0844  NA 137   < >  140 141 139  K 6.3*   < > 3.9 4.3 4.4  CL 115*    < > 106 109 108  CO2 15*   < > '24 23 22  '$ GLUCOSE 129*   < > 154* 142* 232*  BUN 37*   < > 30* 37* 39*  CREATININE 2.03*   < > 1.46* 1.56* 1.71*  CALCIUM 9.7   < > 8.9 9.1 9.0  GFRNONAA 24*   < > 36* 33* 30*  GFRAA 28*   < > 42* 39* 35*  PROT  --    < > 7.4 7.7 7.4  ALBUMIN  --    < > 3.9 4.1 4.0  AST  --    < > '18 19 22  '$ ALT  --    < > '12 14 14  '$ ALKPHOS  --    < > 98 110 96  BILITOT 0.5   < > 0.5 0.5 0.5  BILIDIR <0.1  --   --   --   --   IBILI NOT CALCULATED  --   --   --   --    < > = values in this interval not displayed.   Iron/TIBC/Ferritin/ %Sat    Component Value Date/Time   IRON 46 03/04/2019 1423   IRON 26 (L) 02/04/2019 1309   TIBC 267 03/04/2019 1423   TIBC 251 02/04/2019 1309   FERRITIN 316 (H) 03/04/2019 1423   FERRITIN 409 (H) 02/04/2019 1309   IRONPCTSAT 17 03/04/2019 1423   IRONPCTSAT 10 (L) 02/04/2019 1309    RADIOGRAPHIC STUDIES: I have personally reviewed the radiological images as listed and agreed with the findings in the report. NM PET Image Restag (PS) Skull Base To Thigh  Result Date: 09/01/2019 CLINICAL DATA:  Subsequent treatment strategy for metastatic urothelial carcinoma with ongoing immunotherapy. History of cystectomy and loop ileostomy. EXAM: NUCLEAR MEDICINE PET SKULL BASE TO THIGH TECHNIQUE: 10.6 mCi F-18 FDG was injected intravenously. Full-ring PET imaging was performed from the skull base to thigh after the radiotracer. CT data was obtained and used for attenuation correction and anatomic localization. Fasting blood glucose: 153 mg/dl COMPARISON:  06/18/2019 PET-CT. FINDINGS: Mediastinal blood pool activity: SUV max 3.0 Liver activity: SUV max NA NECK: No hypermetabolic lymph nodes in the neck. Incidental CT findings: none CHEST: No enlarged or hypermetabolic axillary, mediastinal or hilar lymph nodes. No hypermetabolic pulmonary findings. Incidental CT findings: Non hypermetabolic dense 2.1 cm soft tissue nodule in the midline retro-manubrial  high prevascular mediastinum (series 3/image 65), stable, favor an exophytic thyroid nodule. Right internal jugular Port-A-Cath terminates in the right atrium. Three-vessel coronary atherosclerosis. Atherosclerotic nonaneurysmal thoracic aorta. A few new scattered tiny solid pulmonary nodules in the posterior upper lobes bilaterally, largest 4 mm in the left upper lobe (series 3/image 74), below PET resolution. ABDOMEN/PELVIS: Previously visualized hypermetabolic 1.0 cm right mesenteric node with max SUV 9.6 is decreased in size to 0.6 cm (series 3/image 176) and decreased in metabolism with max SUV 2.1. No enlarged or hypermetabolic lymph nodes in the abdomen or pelvis on today's scan. No abnormal hypermetabolic activity within the liver, pancreas, adrenal glands, or spleen. Incidental CT findings: Stable postsurgical changes from cystectomy and diverting loop ileostomy in the ventral right abdominal wall. Vascular stent noted in right external iliac vein. Bilateral pelvic lymph node dissection clips. Hysterectomy. SKELETON: No focal hypermetabolic activity to suggest skeletal metastasis. Incidental CT findings: none IMPRESSION: 1. Response to therapy. Previously hypermetabolic right mesenteric lymph node is  decreased in size and is no longer hypermetabolic. No hypermetabolic metastatic disease on today's scan. 2. Chronic findings include: Aortic Atherosclerosis (ICD10-I70.0). Three-vessel coronary atherosclerosis. Stable non-hypermetabolic retro-manubrial 2.1 cm soft tissue nodule, favor an exophytic thyroid nodule, for which no follow-up is required given patient co-morbidities, nodule stability and difficult location to evaluate by ultrasound. (ref: J Am Coll Radiol. 2015 Feb;12(2): 143-50). Electronically Signed   By: Ilona Sorrel M.D.   On: 09/01/2019 14:23     ASSESSMENT & PLAN:  1. Metastatic urothelial carcinoma ([place])   2. Encounter for antineoplastic immunotherapy   3. History of DVT of lower  extremity   4. Anemia in stage 3a chronic kidney disease   5. Microcytic anemia   Cancer Staging Bladder carcinoma [facility]) Staging form: Urinary Bladder, AJCC 8th Edition - Clinical stage from 08/01/2018: Stage IVA (cTX, cN3, cM1a) - Signed by [name], MD on 04/21/2019  #Metastatic urothelial carcinoma of bladder, sarcomatoid features pelvic sidewall mass showed metastatic carcinoma consistent with involvement by urothelial carcinoma. Patient has been on Keytruda every 3 weeks and most recent PET scan at the end of March showed complete remission. Labs are reviewed and discussed with patient.  Okay to proceed with [facility] treatment today.  #Grade 1 AKI on CKD Creatinine increased 0.'2mg'$ /dL above her baseline. We discussed about the possibilities of immunotherapy induced kidney inflammation versus other etiology.  Patient has diabetes, A1c 7.1.  Today glucose level above 200s.  Advised patient to continue follow-up with primary care provider.  Currently she is not on any treatment for diabetes and the plan was diet controlled. Discussed with her about diabetic diet education. Patient will receive IV fluid today for hydration.   #Patient reports history of " blood" in the urine/dark urine, chronic abdomen discomfort. UA was previously obtained from her ureterostomy bag which showed RBC 6-10, large hemoglobin.  And positive nitrate.  Urine culture shows multiple species.  Likely colonization. Patient has an appointment with Dr. Diamantina Providence next week for further evaluation   # Fatigue, TSH and cortisol level have been both normal.  Continue to monitor  # Chronic anemia, mostly due to CKD.  Hemoglobin has been stable.  Erythropoietin replacement therapy is contraindicated due to cancer. Check iron panel at the next visit  Microcytosis, hemoglobinopathy evaluation was done previously and was normal. She may have alpha thalassemia. I will check alpha thalassemia genotype.   #Right lower extremity   femoral vein DVT provoked by transvenous biopsy in the context of cancer recurrence, Status post IV C filter and mechanical thrombectomy.  IVC filter has been removed. Eliquis therapy was recently discontinued due to patient's complaints of blood in urine. Continue close monitor.  All questions were answered. The patient knows to call the clinic with any problems questions or concerns.  Return of visit: 3 weeks.    [name], MD, PhD Hematology Oncology [facility] Pager- [phone] 10/27/19

## 2019-11-04 ENCOUNTER — Other Ambulatory Visit: Payer: Self-pay

## 2019-11-04 ENCOUNTER — Ambulatory Visit (INDEPENDENT_AMBULATORY_CARE_PROVIDER_SITE_OTHER): Payer: Medicare Other

## 2019-11-04 ENCOUNTER — Ambulatory Visit (INDEPENDENT_AMBULATORY_CARE_PROVIDER_SITE_OTHER): Payer: Medicare Other | Admitting: Urology

## 2019-11-04 ENCOUNTER — Encounter: Payer: Self-pay | Admitting: Urology

## 2019-11-04 ENCOUNTER — Encounter (INDEPENDENT_AMBULATORY_CARE_PROVIDER_SITE_OTHER): Payer: Self-pay | Admitting: Nurse Practitioner

## 2019-11-04 ENCOUNTER — Ambulatory Visit (INDEPENDENT_AMBULATORY_CARE_PROVIDER_SITE_OTHER): Payer: Medicare Other | Admitting: Nurse Practitioner

## 2019-11-04 VITALS — BP 163/77 | HR 74 | Resp 16 | Ht 63.0 in | Wt 196.6 lb

## 2019-11-04 VITALS — BP 146/83 | HR 81 | Ht 63.0 in | Wt 195.0 lb

## 2019-11-04 DIAGNOSIS — Z86718 Personal history of other venous thrombosis and embolism: Secondary | ICD-10-CM

## 2019-11-04 DIAGNOSIS — M79604 Pain in right leg: Secondary | ICD-10-CM

## 2019-11-04 DIAGNOSIS — E1159 Type 2 diabetes mellitus with other circulatory complications: Secondary | ICD-10-CM | POA: Diagnosis not present

## 2019-11-04 DIAGNOSIS — I152 Hypertension secondary to endocrine disorders: Secondary | ICD-10-CM

## 2019-11-04 DIAGNOSIS — K5900 Constipation, unspecified: Secondary | ICD-10-CM

## 2019-11-04 DIAGNOSIS — I10 Essential (primary) hypertension: Secondary | ICD-10-CM

## 2019-11-04 DIAGNOSIS — C679 Malignant neoplasm of bladder, unspecified: Secondary | ICD-10-CM | POA: Diagnosis not present

## 2019-11-04 DIAGNOSIS — E782 Mixed hyperlipidemia: Secondary | ICD-10-CM | POA: Diagnosis not present

## 2019-11-04 MED ORDER — SENNOSIDES-DOCUSATE SODIUM 8.6-50 MG PO TABS
1.0000 | ORAL_TABLET | Freq: Two times a day (BID) | ORAL | 3 refills | Status: DC
Start: 2019-11-04 — End: 2020-03-11

## 2019-11-04 NOTE — Progress Notes (Signed)
   11/04/2019 11:30 AM   Angela Russell 02-25-1951 [id number]  Reason for visit: Follow up muscle invasive bladder cancer  HPI: I saw [name] in urology clinic today for follow-up of bladder cancer.  She originally presented to me with gross hematuria in February 2020 and on TURBT was found to have a 4 cm posterior wall bladder tumor with pathology showing high-grade muscle invasive urothelial cell carcinoma with focal squamous differentiation as well as sarcomatoid changes.  She ultimately underwent a cystectomy and pelvic lymph node dissection with ileal conduit at [facility] with pathology showing T3a disease and negative lymph nodes.  She got 1 cycle of MVAC prior to cystectomy, and this was stopped due to intolerance and AKI.  Her postop course was complicated by pyelonephritis and a pelvic abscess requiring drain.  Unfortunately she recurred in October 2020 with PET showing likely local recurrence in the cystectomy bed as well as retroperitoneal pelvic lymphadenopathy as well as a right common iliac DVT.  She is currently followed by Dr. Tasia Catchings in oncology and is on immunotherapy with pembrolizumab with good response.  Most recent imaging with PET scan on 3/30,000 2021 shows decrease in size of the right mesenteric lymph node and it is no longer hypermetabolic.  There is no significant hydronephrosis.  She was referred back to me by Dr. Tasia Catchings because of a few days of bright red blood in her conduit a few months ago.  She was asymptomatic at that time.  She is unsure if she had bleeding from the stoma or where it might be coming from.  This resolved spontaneously and she has had no pink urine or any bleeding since then.  She reports some vague abdominal pain that comes and goes that she has had over the last few months.  She reports severe constipation with only 1 bowel movement per week.  With a long conversation about her complex history of bladder cancer with recurrence and current  immunotherapy.  Her PET scan is reassuring for no evidence of hydronephrosis.  We discussed the most likely etiology of her few days of bright red bleeding is a stomal bleed that has since resolved.  If she has recurrence of significant gross hematuria from the stoma would consider cystoscopy of the ileal conduit to evaluate for any recurrence, though this would be rare.  Finally, regarding her constipation I recommended an aggressive bowel regimen with MiraLAX and senna docusate, as anticipate her abdominal pain will significantly improve if her constipation is better addressed.  Senna docusate twice daily and MiraLAX daily RTC 3 months symptom check  Billey Co, MD  [facility] [address], [place], [place] [address] [phone]

## 2019-11-04 NOTE — Patient Instructions (Signed)

## 2019-11-04 NOTE — Progress Notes (Signed)
Subjective:    Patient ID: Angela Russell, female    DOB: 1950/07/27, 69 y.o.   MRN: [id number] Chief Complaint  Patient presents with  . Follow-up    ultrasound follow up    The patient presents today with complaints of right lower extremity leg pain.  The patient reports that since her last office visit the pain has decreased however the mobility in her right lower extremity is not quite the same.  The patient previously had a DVT of the right iliofemoral system.  An IVC filter was placed as well as a stent to the right iliac vein.  Her IVC filter was removed on 06/15/2019.  The venogram done at removal showed that her IVC filter was widely patent.  As well as her right iliac stent.  She denies any rest pain like symptoms.  She denies any claudication-like symptoms.  Today noninvasive studies show no evidence of abnormal dilatation in the inferior vena cava or common iliac artery.  No evidence of thrombus involving the right common iliac vein or left common iliac vein.  The right iliac vein stent is patent with no evidence of damage.  The patient underwent bilateral ABIs which reveals an ABI of 1.18 on the right and 1.16 on the left.  The patient has strong triphasic tibial artery waveforms with strong toe waveforms bilaterally.        Review of Systems  Musculoskeletal:       Groin pain and foot soreness       Objective:   Physical Exam Vitals reviewed.  HENT:     Head: Normocephalic.  Cardiovascular:     Rate and Rhythm: Normal rate and regular rhythm.     Pulses: Decreased pulses.     Heart sounds: Normal heart sounds.  Pulmonary:     Effort: Pulmonary effort is normal.     Breath sounds: Normal breath sounds.  Musculoskeletal:        General: Tenderness present.  Neurological:     Mental Status: She is alert and oriented to person, place, and time.  Psychiatric:        Mood and Affect: Mood normal.        Behavior: Behavior normal.        Thought Content:  Thought content normal.        Judgment: Judgment normal.     BP (!) 163/77 (BP Location: Right Arm)   Pulse 74   Resp 16   Ht 5\' 3"  (1.6 m)   Wt 196 lb 9.6 oz (89.2 kg)   BMI 34.83 kg/m   Past Medical History:  Diagnosis Date  . Carotid artery plaque, right 01/2014  . CKD (chronic kidney disease)    stage 2-3  . Diabetes mellitus without complication ([place])   . DM (diabetes mellitus), type 2, uncontrolled ([place])   . Hyperlipidemia   . Hypertension   . Hypochromic microcytic anemia    mild  . Metastatic urothelial carcinoma ([place]) 03/23/2019  . Osteoporosis   . Renal insufficiency   . Rotator cuff tendonitis, right     Social History   Socioeconomic History  . Marital status: Single    Spouse name: Not on file  . Number of children: 1  . Years of education: Not on file  . Highest education level: 10th grade  Occupational History  . Not on file  Tobacco Use  . Smoking status: Former Smoker    Quit date: 06/05/1991    Years since quitting:  28.4  . Smokeless tobacco: Never Used  Substance and Sexual Activity  . Alcohol use: No  . Drug use: No  . Sexual activity: Never  Other Topics Concern  . Not on file  Social History Narrative   Working full time   Social Determinants of Radio broadcast assistant Strain:   . Difficulty of Paying Living Expenses:   Food Insecurity:   . Worried About Charity fundraiser in the Last Year:   . Arboriculturist in the Last Year:   Transportation Needs:   . Film/video editor (Medical):   Marland Kitchen Lack of Transportation (Non-Medical):   Physical Activity:   . Days of Exercise per Week:   . Minutes of Exercise per Session:   Stress:   . Feeling of Stress :   Social Connections:   . Frequency of Communication with Friends and Family:   . Frequency of Social Gatherings with Friends and Family:   . Attends Religious Services:   . Active Member of Clubs or Organizations:   . Attends Archivist Meetings:   Marland Kitchen Marital  Status:   Intimate Partner Violence:   . Fear of Current or Ex-Partner:   . Emotionally Abused:   Marland Kitchen Physically Abused:   . Sexually Abused:     Past Surgical History:  Procedure Laterality Date  . ABDOMINAL HYSTERECTOMY  2000   due to bleeding and fibroids, partial- still has ovaries  . CYSTOSCOPY W/ RETROGRADES Bilateral 07/16/2018   Procedure: CYSTOSCOPY WITH RETROGRADE PYELOGRAM;  Surgeon: Billey Co, MD;  Location: [facility];  Service: Urology;  Laterality: Bilateral;  . IVC FILTER INSERTION N/A 03/30/2019   Procedure: IVC FILTER INSERTION;  Surgeon: Algernon Huxley, MD;  Location: [place] [facility];  Service: Cardiovascular;  Laterality: N/A;  . IVC FILTER REMOVAL N/A 06/15/2019   Procedure: IVC FILTER REMOVAL;  Surgeon: Algernon Huxley, MD;  Location: [place] [facility];  Service: Cardiovascular;  Laterality: N/A;  . KNEE SURGERY Left 03/17/2013   torn meniscus  . PERIPHERAL VASCULAR THROMBECTOMY Right 03/30/2019   Procedure: PERIPHERAL VASCULAR THROMBECTOMY;  Surgeon: Algernon Huxley, MD;  Location: [place] [facility];  Service: Cardiovascular;  Laterality: Right;  . PORTA CATH INSERTION N/A 08/06/2018   Procedure: PORTA CATH INSERTION;  Surgeon: Algernon Huxley, MD;  Location: [place] [facility];  Service: Cardiovascular;  Laterality: N/A;  . TRANSURETHRAL RESECTION OF BLADDER TUMOR N/A 07/16/2018   Procedure: TRANSURETHRAL RESECTION OF BLADDER TUMOR (TURBT);  Surgeon: Billey Co, MD;  Location: [facility];  Service: Urology;  Laterality: N/A;    Family History  Problem Relation Age of Onset  . Heart disease Mother   . Heart attack Mother   . Arthritis Father   . Diabetes Brother     No Known Allergies     Assessment & Plan:   1. Pain of right lower extremity Recommend:  I do not find evidence of Vascular pathology that would explain the patient's symptoms  The patient has atypical pain symptoms for vascular disease  I do not find evidence of Vascular  pathology that would explain the patient's symptoms and I suspect the patient is c/o pseudoclaudication.  Patient should have an evaluation of his LS spine which I defer to the primary service.  Noninvasive studies of the legs do not identify vascular problems  The patient should continue walking and begin a more formal exercise program. The patient should continue his antiplatelet therapy  and aggressive treatment of the lipid abnormalities. The patient should begin wearing graduated compression socks 15-20 mmHg strength to control her mild edema.  Patient will follow-up with me on a PRN basis  Further work-up of her lower extremity pain is deferred to the primary service     2. Hypertension associated with diabetes ([place]) Continue antihypertensive medications as already ordered, these medications have been reviewed and there are no changes at this time.   3. Mixed hyperlipidemia Continue statin as ordered and reviewed, no changes at this time   4. History of DVT of lower extremity The right iliac vein stent is patent.  There is no evidence of damage.  No medication changes made today.    Current Outpatient Medications on File Prior to Visit  Medication Sig Dispense Refill  . amLODipine (NORVASC) 5 MG tablet Take 1 tablet (5 mg total) by mouth daily. 90 tablet 1  . diclofenac sodium (VOLTAREN) 1 % GEL Apply 2 g topically 4 (four) times daily. 100 g 2  . lidocaine-prilocaine (EMLA) cream Apply to affected area once 30 g 3  . polyethylene glycol (MIRALAX / GLYCOLAX) 17 g packet Take 17 g by mouth daily as needed for mild constipation. 14 each 0  . rosuvastatin (CRESTOR) 40 MG tablet Take 1 tablet (40 mg total) by mouth daily. 90 tablet 1  . sulfamethoxazole-trimethoprim (BACTRIM DS) 800-160 MG tablet Take 1 tablet by mouth 2 (two) times daily. (Patient not taking: Reported on 10/13/2019) 10 tablet 0  . [DISCONTINUED] prochlorperazine (COMPAZINE) 10 MG tablet Take 1 tablet (10 mg total)  by mouth every 6 (six) hours as needed (Nausea or vomiting). 30 tablet 1   No current facility-administered medications on file prior to visit.    There are no Patient Instructions on file for this visit. No follow-ups on file.   Kris Hartmann, NP

## 2019-11-17 ENCOUNTER — Inpatient Hospital Stay: Payer: Medicare Other

## 2019-11-17 ENCOUNTER — Inpatient Hospital Stay: Payer: Medicare Other | Attending: Oncology

## 2019-11-17 ENCOUNTER — Encounter: Payer: Self-pay | Admitting: Oncology

## 2019-11-17 ENCOUNTER — Inpatient Hospital Stay (HOSPITAL_BASED_OUTPATIENT_CLINIC_OR_DEPARTMENT_OTHER): Payer: Medicare Other | Admitting: Oncology

## 2019-11-17 ENCOUNTER — Other Ambulatory Visit: Payer: Self-pay

## 2019-11-17 VITALS — BP 147/76 | Temp 96.7°F | Resp 18 | Wt 198.5 lb

## 2019-11-17 DIAGNOSIS — D631 Anemia in chronic kidney disease: Secondary | ICD-10-CM | POA: Diagnosis not present

## 2019-11-17 DIAGNOSIS — M810 Age-related osteoporosis without current pathological fracture: Secondary | ICD-10-CM | POA: Insufficient documentation

## 2019-11-17 DIAGNOSIS — Z79899 Other long term (current) drug therapy: Secondary | ICD-10-CM | POA: Diagnosis not present

## 2019-11-17 DIAGNOSIS — C7910 Secondary malignant neoplasm of unspecified urinary organs: Secondary | ICD-10-CM

## 2019-11-17 DIAGNOSIS — C679 Malignant neoplasm of bladder, unspecified: Secondary | ICD-10-CM | POA: Insufficient documentation

## 2019-11-17 DIAGNOSIS — N189 Chronic kidney disease, unspecified: Secondary | ICD-10-CM | POA: Insufficient documentation

## 2019-11-17 DIAGNOSIS — Z7901 Long term (current) use of anticoagulants: Secondary | ICD-10-CM | POA: Diagnosis not present

## 2019-11-17 DIAGNOSIS — Z95828 Presence of other vascular implants and grafts: Secondary | ICD-10-CM

## 2019-11-17 DIAGNOSIS — I129 Hypertensive chronic kidney disease with stage 1 through stage 4 chronic kidney disease, or unspecified chronic kidney disease: Secondary | ICD-10-CM | POA: Diagnosis not present

## 2019-11-17 DIAGNOSIS — R319 Hematuria, unspecified: Secondary | ICD-10-CM | POA: Insufficient documentation

## 2019-11-17 DIAGNOSIS — D509 Iron deficiency anemia, unspecified: Secondary | ICD-10-CM

## 2019-11-17 DIAGNOSIS — N179 Acute kidney failure, unspecified: Secondary | ICD-10-CM

## 2019-11-17 DIAGNOSIS — Z86718 Personal history of other venous thrombosis and embolism: Secondary | ICD-10-CM | POA: Insufficient documentation

## 2019-11-17 DIAGNOSIS — R5383 Other fatigue: Secondary | ICD-10-CM | POA: Diagnosis not present

## 2019-11-17 DIAGNOSIS — N1831 Chronic kidney disease, stage 3a: Secondary | ICD-10-CM | POA: Diagnosis not present

## 2019-11-17 DIAGNOSIS — K5900 Constipation, unspecified: Secondary | ICD-10-CM | POA: Diagnosis not present

## 2019-11-17 DIAGNOSIS — Z5112 Encounter for antineoplastic immunotherapy: Secondary | ICD-10-CM

## 2019-11-17 DIAGNOSIS — E785 Hyperlipidemia, unspecified: Secondary | ICD-10-CM | POA: Diagnosis not present

## 2019-11-17 DIAGNOSIS — E1122 Type 2 diabetes mellitus with diabetic chronic kidney disease: Secondary | ICD-10-CM | POA: Insufficient documentation

## 2019-11-17 DIAGNOSIS — Z87891 Personal history of nicotine dependence: Secondary | ICD-10-CM | POA: Insufficient documentation

## 2019-11-17 LAB — COMPREHENSIVE METABOLIC PANEL
ALT: 12 U/L (ref 0–44)
AST: 19 U/L (ref 15–41)
Albumin: 4.1 g/dL (ref 3.5–5.0)
Alkaline Phosphatase: 99 U/L (ref 38–126)
Anion gap: 10 (ref 5–15)
BUN: 37 mg/dL — ABNORMAL HIGH (ref 8–23)
CO2: 23 mmol/L (ref 22–32)
Calcium: 9 mg/dL (ref 8.9–10.3)
Chloride: 107 mmol/L (ref 98–111)
Creatinine, Ser: 1.83 mg/dL — ABNORMAL HIGH (ref 0.44–1.00)
GFR calc Af Amer: 32 mL/min — ABNORMAL LOW (ref >60)
GFR calc non Af Amer: 27 mL/min — ABNORMAL LOW (ref >60)
Glucose, Bld: 143 mg/dL — ABNORMAL HIGH (ref 70–99)
Potassium: 4.6 mmol/L (ref 3.5–5.1)
Sodium: 140 mmol/L (ref 135–145)
Total Bilirubin: 0.5 mg/dL (ref 0.3–1.2)
Total Protein: 7.9 g/dL (ref 6.5–8.1)

## 2019-11-17 LAB — CBC WITH DIFFERENTIAL/PLATELET
Abs Immature Granulocytes: 0.03 10*3/uL (ref 0.00–0.07)
Basophils Absolute: 0 10*3/uL (ref 0.0–0.1)
Basophils Relative: 1 %
Eosinophils Absolute: 0.1 10*3/uL (ref 0.0–0.5)
Eosinophils Relative: 2 %
HCT: 29.8 % — ABNORMAL LOW (ref 36.0–46.0)
Hemoglobin: 9.4 g/dL — ABNORMAL LOW (ref 12.0–15.0)
Immature Granulocytes: 1 %
Lymphocytes Relative: 23 %
Lymphs Abs: 1.3 10*3/uL (ref 0.7–4.0)
MCH: 24.7 pg — ABNORMAL LOW (ref 26.0–34.0)
MCHC: 31.5 g/dL (ref 30.0–36.0)
MCV: 78.4 fL — ABNORMAL LOW (ref 80.0–100.0)
Monocytes Absolute: 0.5 10*3/uL (ref 0.1–1.0)
Monocytes Relative: 8 %
Neutro Abs: 3.8 10*3/uL (ref 1.7–7.7)
Neutrophils Relative %: 65 %
Platelets: 200 10*3/uL (ref 150–400)
RBC: 3.8 MIL/uL — ABNORMAL LOW (ref 3.87–5.11)
RDW: 13.8 % (ref 11.5–15.5)
WBC: 5.8 10*3/uL (ref 4.0–10.5)
nRBC: 0 % (ref 0.0–0.2)

## 2019-11-17 LAB — TSH: TSH: 2.38 u[IU]/mL (ref 0.350–4.500)

## 2019-11-17 LAB — IRON AND TIBC
Iron: 73 ug/dL (ref 28–170)
Saturation Ratios: 23 % (ref 10.4–31.8)
TIBC: 316 ug/dL (ref 250–450)
UIBC: 243 ug/dL

## 2019-11-17 LAB — FERRITIN: Ferritin: 256 ng/mL (ref 11–307)

## 2019-11-17 MED ORDER — SODIUM CHLORIDE 0.9% FLUSH
10.0000 mL | INTRAVENOUS | Status: DC | PRN
  Administered 2019-11-17: 10 mL via INTRAVENOUS
  Filled 2019-11-17: qty 10

## 2019-11-17 MED ORDER — HEPARIN SOD (PORK) LOCK FLUSH 100 UNIT/ML IV SOLN
500.0000 [IU] | Freq: Once | INTRAVENOUS | Status: AC
  Administered 2019-11-17: 500 [IU] via INTRAVENOUS
  Filled 2019-11-17: qty 5

## 2019-11-17 NOTE — Progress Notes (Signed)
Hematology/Oncology follow up note Angela Russell [phone] [phone]   Patient Care Team: Lane, Rachel Elizabeth, PA-C as PCP - General (Family Medicine) Angela Russell, OD (Optometry) Angela Alcala, MD as Consulting Physician (Hematology and Oncology)  REFERRING PROVIDER: Dr.Sninsky CHIEF COMPLAINTS/REASON FOR VISIT:  Follow up for bladder cancer, anemia.   HISTORY OF PRESENTING ILLNESS:  Angela Russell is a  69 y.o.  female with PMH listed below who was referred to me for evaluation of newly diagnosed bladder cancer. Patient initially presented to emergency room at the end of January 2020 for evaluation of dysuria, hematuria and the left lower quadrant inguinal pain and flank pain.  1/28 2020 CT renal stone study showed suspected irregular wall thickening about the superior bladder, not well assessed due to degree of bladder distention.  Recommend cystoscopy for further evaluation.  No renal stone or obstructive uropathy.  Patient was given IV Rocephin and referred patient for outpatient urology follow-up.  Urine culture was negative.  She again presented to ER after 2 days with similar symptoms.  Patient has 25-pack-year smoking history, quit approximately 20 years ago.  No family history of any urology malignancies 07/03/2018 another CT abdomen pelvis with contrast was done which showed no nephrolithiasis or hydronephrosis is identified.  Bladder is decompressed limiting evaluation.  07/16/2018.urology Dr. Sinskey - cystoscopy and bilateral retrograde pyelogram on 07/16/2018.  Pyelogram did not show any filling defect or abnormalities.  No hydronephrosis.  Ureteral orifice was not involved with tumor.  There is a large 5 cm posterior wall bladder tumor, bullous and sessile appearing.  Patient underwent TURBT.   Pathology: High-grade urothelial carcinoma, invasive into muscularis propria.  Lymphovascular invasion is present.  Carcinoma in situ is also  identified.  Focal squamous differentiation is noted, areas of invasive carcinoma display pleomorphic/sarcomatoid changes.  T2b  08/07/2018 CT without contrast negative.  2 subpleural right upper lobe nodule 2 to 3 mm likely benign. She also had baseline audiometry done.  08/04/2018 ddMVAC x 1 cycle, stopped due to intolerance and AKI.  Patient received 1 cycle of dd MVAC, not able to tolerate due to AKI. Patient then was referred to [facility]  09/22/2018 patient underwent a cystectomy, pathology pT3a N0 Mx.  11 lymph nodes were harvested and was all negative. Invasive urothelia carcinoma, high grade, with sarcomatoid features.   10/14/2018 patient was admitted due to pyelonephritis and a pelvic fluid collection.  Drain was placed. 10/23/2018 drain was removed. Patient has had difficulties getting to her appointments to [facility]. 02/09/2019, patient presented with abdominal pain. CT concerning for small bowel obstruction and increased size of right pelvic fluid collection concerning for cancer recurrence.  Right hydronephrosis to the level of pelvis and enlarged lymph nodes. Patient had JP drain placed with CT guidance to pelvic fluid collection and drained 400 cc amber fluid.  Culture was negative for growth of microorganisms and cytology was negative for malignancy.-JP drain was removed on the day of discharge. CT-guided core biopsy of pelvic lymph node adenopathy was attempted but not successful.  # Right lower extremity DVT, provoked by Transvenous biopsy  02/26/2019 transvenous biopsy by IR unsuccessful transvenous biopsy of the right pelvis mass. Post biopsy acute thrombus in the right external iliac and common femoral vein.  Patient was recommended to start anticoagulation with Xarelto however due to the co-pay, patient is not able to afford the medication. Anticoagulation regimen was switched to Eliquis 5 mg.  Patient reports that she has been taking   it once a day. 03/20/2019 PET showed  FDG avid tissue in the cystectomy bed, retroperitoneal and pelvic lymphadenopathy.  Right common iliac DVT.  # increased right lower extremity swelling.  She was started on Eliquis for anticoagulation by [facility].  We clarified with her pharmacy and she was actually taking Eliquis 2.5 mg twice daily. Right lower extremity swelling has not improved but instead worsened. 03/16/2019 She had ultrasound right lower extremity done which showed persistent extensive proximal right lower extremity DVT.  Anticoagulation regimen has increased to Eliquis 5 mg twice daily.  # establish care with [facility] Angela Russell for evaluation.  Angela Russell recommended starting immunotherapy with PD-L1 inhibitor Pembrolizumab 200 mg every 3 weeks.  The sarcomatoid histology may not respond to chemotherapy well, could portend a better chance of response into immunotherapy PET scan after 4 cycles of Keytruda showed single mildly enlarged central mesenteric lymph node in the upper pelvis with SUV 9.6.  No other abnormal hypermetabolic activity was reported.  # 03/30/2019 Status post IVC filter placement, mechanical thrombectomy.-IVC filter was retrieved in January 2021. # PD-L1 CPS 100%.  # 03/31/2019 started on immunotherapy Keytruda.   INTERVAL HISTORY ERA Angela Russell is a 69 y.o. female who has above history reviewed by me today presents for follow up visit for evaluation form metastatic high-grade urothelial carcinoma of the bladder. Sarcomatoid feature.  Continues continues to have chronic abdominal discomfort, bandlike.  She is described as soreness like someone is poking inside.  Pain is 5 out of 10, no exacerbating or alleviating factors. Patient is constipated and use senna and MiraLAX.  Bowel movement is getting better .  No new complaints. She has also been seen by Dr. Cherlyn Labella recently.  Review of Systems  Constitutional: Negative for appetite change, chills, fatigue and fever.  HENT:   Negative for  hearing loss and voice change.   Eyes: Negative for eye problems.  Respiratory: Negative for chest tightness and cough.   Cardiovascular: Negative for chest pain and leg swelling.  Gastrointestinal: Positive for abdominal pain. Negative for abdominal distention and blood in stool.  Endocrine: Negative for hot flashes.  Genitourinary: Negative for difficulty urinating and frequency.   Musculoskeletal: Negative for arthralgias.  Skin: Negative for itching and rash.  Neurological: Positive for numbness. Negative for extremity weakness.  Hematological: Negative for adenopathy.  Psychiatric/Behavioral: Negative for confusion. The patient is not nervous/anxious.     MEDICAL HISTORY:  Past Medical History:  Diagnosis Date  . Carotid artery plaque, right 01/2014  . CKD (chronic kidney disease)    stage 2-3  . Diabetes mellitus without complication ([place])   . DM (diabetes mellitus), type 2, uncontrolled ([place])   . Hyperlipidemia   . Hypertension   . Hypochromic microcytic anemia    mild  . Metastatic urothelial carcinoma ([place]) 03/23/2019  . Osteoporosis   . Renal insufficiency   . Rotator cuff tendonitis, right     SURGICAL HISTORY: Past Surgical History:  Procedure Laterality Date  . ABDOMINAL HYSTERECTOMY  2000   due to bleeding and fibroids, partial- still has ovaries  . CYSTOSCOPY W/ RETROGRADES Bilateral 07/16/2018   Procedure: CYSTOSCOPY WITH RETROGRADE PYELOGRAM;  Surgeon: Billey Co, MD;  Location: [facility];  Service: Urology;  Laterality: Bilateral;  . IVC FILTER INSERTION N/A 03/30/2019   Procedure: IVC FILTER INSERTION;  Surgeon: Algernon Huxley, MD;  Location: [place] [facility];  Service: Cardiovascular;  Laterality: N/A;  . IVC FILTER REMOVAL N/A 06/15/2019   Procedure: IVC  FILTER REMOVAL;  Surgeon: Algernon Huxley, MD;  Location: [place] [facility];  Service: Cardiovascular;  Laterality: N/A;  . KNEE SURGERY Left 03/17/2013   torn meniscus  . PERIPHERAL VASCULAR  THROMBECTOMY Right 03/30/2019   Procedure: PERIPHERAL VASCULAR THROMBECTOMY;  Surgeon: Algernon Huxley, MD;  Location: [place] [facility];  Service: Cardiovascular;  Laterality: Right;  . PORTA CATH INSERTION N/A 08/06/2018   Procedure: PORTA CATH INSERTION;  Surgeon: Algernon Huxley, MD;  Location: [place] [facility];  Service: Cardiovascular;  Laterality: N/A;  . TRANSURETHRAL RESECTION OF BLADDER TUMOR N/A 07/16/2018   Procedure: TRANSURETHRAL RESECTION OF BLADDER TUMOR (TURBT);  Surgeon: Billey Co, MD;  Location: [facility];  Service: Urology;  Laterality: N/A;    SOCIAL HISTORY: Social History   Socioeconomic History  . Marital status: Single    Spouse name: Not on file  . Number of children: 1  . Years of education: Not on file  . Highest education level: 10th grade  Occupational History  . Not on file  Tobacco Use  . Smoking status: Former Smoker    Quit date: 06/05/1991    Years since quitting: 28.4  . Smokeless tobacco: Never Used  Vaping Use  . Vaping Use: Never used  Substance and Sexual Activity  . Alcohol use: No  . Drug use: No  . Sexual activity: Never  Other Topics Concern  . Not on file  Social History Narrative   Working full time   Social Determinants of Radio broadcast assistant Strain:   . Difficulty of Paying Living Expenses:   Food Insecurity:   . Worried About Charity fundraiser in the Last Year:   . Arboriculturist in the Last Year:   Transportation Needs:   . Film/video editor (Medical):   Marland Kitchen Lack of Transportation (Non-Medical):   Physical Activity:   . Days of Exercise per Week:   . Minutes of Exercise per Session:   Stress:   . Feeling of Stress :   Social Connections:   . Frequency of Communication with Friends and Family:   . Frequency of Social Gatherings with Friends and Family:   . Attends Religious Services:   . Active Member of Clubs or Organizations:   . Attends Archivist Meetings:   Marland Kitchen Marital Status:     Intimate Partner Violence:   . Fear of Current or Ex-Partner:   . Emotionally Abused:   Marland Kitchen Physically Abused:   . Sexually Abused:     FAMILY HISTORY: Family History  Problem Relation Age of Onset  . Heart disease Mother   . Heart attack Mother   . Arthritis Father   . Diabetes Brother     ALLERGIES:  has No Known Allergies.  MEDICATIONS:  Current Outpatient Medications  Medication Sig Dispense Refill  . amLODipine (NORVASC) 5 MG tablet Take 1 tablet (5 mg total) by mouth daily. 90 tablet 1  . diclofenac sodium (VOLTAREN) 1 % GEL Apply 2 g topically 4 (four) times daily. 100 g 2  . lidocaine-prilocaine (EMLA) cream Apply to affected area once 30 g 3  . polyethylene glycol (MIRALAX / GLYCOLAX) 17 g packet Take 17 g by mouth daily as needed for mild constipation. 14 each 0  . rosuvastatin (CRESTOR) 40 MG tablet Take 1 tablet (40 mg total) by mouth daily. 90 tablet 1  . senna-docusate (SENOKOT-S) 8.6-50 MG tablet Take 1 tablet by mouth 2 (two) times daily. OK  to hold or decrease to 1x per day if diarrhea 30 tablet 3   No current facility-administered medications for this visit.     PHYSICAL EXAMINATION: ECOG PERFORMANCE STATUS: 1 - Symptomatic but completely ambulatory Vitals:   11/17/19 0958  BP: (!) 147/76  Resp: 18  Temp: (!) 96.7 F (35.9 C)   Filed Weights   11/17/19 0958  Weight: 198 lb 8 oz (90 kg)    Physical Exam Constitutional:      General: She is not in acute distress.    Comments: Walk independently  HENT:     Head: Normocephalic and atraumatic.  Eyes:     General: No scleral icterus.    Pupils: Pupils are equal, round, and reactive to light.  Cardiovascular:     Rate and Rhythm: Normal rate and regular rhythm.     Heart sounds: Normal heart sounds.  Pulmonary:     Effort: Pulmonary effort is normal. No respiratory distress.     Breath sounds: No wheezing.  Abdominal:     General: Bowel sounds are normal. There is no distension.      Palpations: Abdomen is soft. There is no mass.  Musculoskeletal:        General: No deformity. Normal range of motion.     Cervical back: Normal range of motion and neck supple.  Skin:    General: Skin is warm and dry.     Coloration: Skin is not pale.     Findings: No erythema or rash.     Comments: Ureterostomy, with light yellow color urine in the ostomy bag.  Neurological:     Mental Status: She is alert and oriented to person, place, and time. Mental status is at baseline.     Cranial Nerves: No cranial nerve deficit.     Coordination: Coordination normal.  Psychiatric:        Mood and Affect: Mood normal.     RADIOGRAPHIC STUDIES: I have personally reviewed the radiological images as listed and agreed with the findings in the report.  CMP Latest Ref Rng & Units 11/17/2019  Glucose 70 - 99 mg/dL 143(H)  BUN 8 - 23 mg/dL 37(H)  Creatinine 0.44 - 1.00 mg/dL 1.83(H)  Sodium 135 - 145 mmol/L 140  Potassium 3.5 - 5.1 mmol/L 4.6  Chloride 98 - 111 mmol/L 107  CO2 22 - 32 mmol/L 23  Calcium 8.9 - 10.3 mg/dL 9.0  Total Protein 6.5 - 8.1 g/dL 7.9  Total Bilirubin 0.3 - 1.2 mg/dL 0.5  Alkaline Phos 38 - 126 U/L 99  AST 15 - 41 U/L 19  ALT 0 - 44 U/L 12   CBC Latest Ref Rng & Units 11/17/2019  WBC 4.0 - 10.5 K/uL 5.8  Hemoglobin 12.0 - 15.0 g/dL 9.4(L)  Hematocrit 36 - 46 % 29.8(L)  Platelets 150 - 400 K/uL 200    LABORATORY DATA:  I have reviewed the data as listed Lab Results  Component Value Date   WBC 5.8 11/17/2019   HGB 9.4 (L) 11/17/2019   HCT 29.8 (L) 11/17/2019   MCV 78.4 (L) 11/17/2019   PLT 200 11/17/2019   Recent Labs    12/19/18 1841 12/20/18 0004 10/06/19 0847 10/27/19 0844 11/17/19 0936  NA 137   < > 141 139 140  K 6.3*   < > 4.3 4.4 4.6  CL 115*   < > 109 108 107  CO2 15*   < > _0 GLUCOSE 129*   < >  142* 232* 143*  BUN 37*   < > 37* 39* 37*  CREATININE 2.03*   < > 1.56* 1.71* 1.83*  CALCIUM 9.7   < > 9.1 9.0 9.0  GFRNONAA 24*   < >  33* 30* 27*  GFRAA 28*   < > 39* 35* 32*  PROT  --    < > 7.7 7.4 7.9  ALBUMIN  --    < > 4.1 4.0 4.1  AST  --    < > _0 ALT  --    < > _1 ALKPHOS  --    < > 110 96 99  BILITOT 0.5   < > 0.5 0.5 0.5  BILIDIR <0.1  --   --   --   --   IBILI NOT CALCULATED  --   --   --   --    < > = values in this interval not displayed.   Iron/TIBC/Ferritin/ %Sat    Component Value Date/Time   IRON 73 11/17/2019 0936   IRON 26 (L) 02/04/2019 1309   TIBC 316 11/17/2019 0936   TIBC 251 02/04/2019 1309   FERRITIN 256 11/17/2019 0936   FERRITIN 409 (H) 02/04/2019 1309   IRONPCTSAT 23 11/17/2019 0936   IRONPCTSAT 10 (L) 02/04/2019 1309    RADIOGRAPHIC STUDIES: I have personally reviewed the radiological images as listed and agreed with the findings in the report. NM PET Image Restag (PS) Skull Base To Thigh  Result Date: 09/01/2019 CLINICAL DATA:  Subsequent treatment strategy for metastatic urothelial carcinoma with ongoing immunotherapy. History of cystectomy and loop ileostomy. EXAM: NUCLEAR MEDICINE PET SKULL BASE TO THIGH TECHNIQUE: 10.6 mCi F-18 FDG was injected intravenously. Full-ring PET imaging was performed from the skull base to thigh after the radiotracer. CT data was obtained and used for attenuation correction and anatomic localization. Fasting blood glucose: 153 mg/dl COMPARISON:  06/18/2019 PET-CT. FINDINGS: Mediastinal blood pool activity: SUV max 3.0 Liver activity: SUV max NA NECK: No hypermetabolic lymph nodes in the neck. Incidental CT findings: none CHEST: No enlarged or hypermetabolic axillary, mediastinal or hilar lymph nodes. No hypermetabolic pulmonary findings. Incidental CT findings: Non hypermetabolic dense 2.1 cm soft tissue nodule in the midline retro-manubrial high prevascular mediastinum (series 3/image 65), stable, favor an exophytic thyroid nodule. Right internal jugular Port-A-Cath terminates in the right atrium. Three-vessel coronary atherosclerosis.  Atherosclerotic nonaneurysmal thoracic aorta. A few new scattered tiny solid pulmonary nodules in the posterior upper lobes bilaterally, largest 4 mm in the left upper lobe (series 3/image 74), below PET resolution. ABDOMEN/PELVIS: Previously visualized hypermetabolic 1.0 cm right mesenteric node with max SUV 9.6 is decreased in size to 0.6 cm (series 3/image 176) and decreased in metabolism with max SUV 2.1. No enlarged or hypermetabolic lymph nodes in the abdomen or pelvis on today's scan. No abnormal hypermetabolic activity within the liver, pancreas, adrenal glands, or spleen. Incidental CT findings: Stable postsurgical changes from cystectomy and diverting loop ileostomy in the ventral right abdominal wall. Vascular stent noted in right external iliac vein. Bilateral pelvic lymph node dissection clips. Hysterectomy. SKELETON: No focal hypermetabolic activity to suggest skeletal metastasis. Incidental CT findings: none IMPRESSION: 1. Response to therapy. Previously hypermetabolic right mesenteric lymph node is decreased in size and is no longer hypermetabolic. No hypermetabolic metastatic disease on today's scan. 2. Chronic findings include: Aortic Atherosclerosis (ICD10-I70.0). Three-vessel coronary atherosclerosis. Stable non-hypermetabolic retro-manubrial 2.1 cm soft tissue nodule, favor an exophytic thyroid nodule, for which no  follow-up is required given patient co-morbidities, nodule stability and difficult location to evaluate by ultrasound. (ref: J Am Coll Radiol. 2015 Feb;12(2): 143-50). Electronically Signed   By: Ilona Sorrel M.D.   On: 09/01/2019 14:23   VAS [place] IVC/ILIAC (VENOUS ONLY)  Result Date: 11/04/2019 IVC/ILIAC STUDY Limitations: Air/bowel gas, obesity and Ostomy bag.  Performing Technologist: Charlane Ferretti RT (R)(VS)  Examination Guidelines: A complete evaluation includes Russell-mode imaging, spectral Doppler, color Doppler, and power Doppler as needed of all accessible portions of each  vessel. Bilateral testing is considered an integral part of a complete examination. Limited examinations for reoccurring indications may be performed as noted.  IVC/Iliac Findings: +----------+------+--------+--------+    IVC    PatentThrombusComments +----------+------+--------+--------+ IVC Prox  patent                 +----------+------+--------+--------+ IVC Mid   patent                 +----------+------+--------+--------+ IVC Distalpatent                 +----------+------+--------+--------+  +-----------------+---------+-----------+---------+-----------+--------+        CIV       RT-PatentRT-ThrombusLT-PatentLT-ThrombusComments +-----------------+---------+-----------+---------+-----------+--------+ Common Iliac Prox patent              patent                      +-----------------+---------+-----------+---------+-----------+--------+ Common Iliac Mid  patent              patent                      +-----------------+---------+-----------+---------+-----------+--------+  Summary: IVC/Iliac: No evidence of abdnormal dilitation was noted in the Inferior Vena Cava and common Iliac artery. There is no evidence of thrombus involving the right common iliac vein and left common iliac vein. Patent right iliac vein stent.  *See table(s) above for measurements and observations.  Electronically signed by Leotis Pain MD on 11/04/2019 at 12:46:40 PM.   Final    VAS [place] ABI WITH/WO TBI  Result Date: 11/04/2019 LOWER EXTREMITY DOPPLER STUDY Indications: Rest pain, and peripheral artery disease.  Performing Technologist: Charlane Ferretti RT (R)(VS)  Examination Guidelines: A complete evaluation includes at minimum, Doppler waveform signals and systolic blood pressure reading at the level of bilateral brachial, anterior tibial, and posterior tibial arteries, when vessel segments are accessible. Bilateral testing is considered an integral part of a complete examination. Photoelectric  Plethysmograph (PPG) waveforms and toe systolic pressure readings are included as required and additional duplex testing as needed. Limited examinations for reoccurring indications may be performed as noted.  ABI Findings: +---------+------------------+-----+---------+--------+ Right    Rt Pressure (mmHg)IndexWaveform Comment  +---------+------------------+-----+---------+--------+ Brachial 164                                      +---------+------------------+-----+---------+--------+ ATA      196               1.18 triphasic         +---------+------------------+-----+---------+--------+ PTA      190               1.14 triphasic         +---------+------------------+-----+---------+--------+ Great Toe195               1.17 Normal            +---------+------------------+-----+---------+--------+ +---------+------------------+-----+---------+-------+  Left     Lt Pressure (mmHg)IndexWaveform Comment +---------+------------------+-----+---------+-------+ Brachial 166                                     +---------+------------------+-----+---------+-------+ ATA      192               1.16 triphasic        +---------+------------------+-----+---------+-------+ PTA      192               1.16 triphasic        +---------+------------------+-----+---------+-------+ Great Toe249               1.50 Normal           +---------+------------------+-----+---------+-------+ Summary: Right: Resting right ankle-brachial index is within normal range. No evidence of significant right lower extremity arterial disease. The right toe-brachial index is normal. Left: Resting left ankle-brachial index is within normal range. No evidence of significant left lower extremity arterial disease. The left toe-brachial index is normal. *See table(s) above for measurements and observations.  Electronically signed by Leotis Pain MD on 11/04/2019 at 12:46:35 PM.   Final      ASSESSMENT &  PLAN:  1. Metastatic urothelial carcinoma ([place])   2. Microcytic anemia   3. History of DVT of lower extremity   4. Anemia in stage 3a chronic kidney disease   5. AKI (acute kidney injury) [facility])   Cancer Staging Bladder carcinoma [facility]) Staging form: Urinary Bladder, AJCC 8th Edition - Clinical stage from 08/01/2018: Stage IVA (cTX, cN3, cM1a) - Signed by [name], MD on 04/21/2019  #Metastatic urothelial carcinoma of bladder, sarcomatoid features pelvic sidewall mass showed metastatic carcinoma consistent with involvement by urothelial carcinoma. Patient has been on Keytruda every 3 weeks and most recent PET scan at the end of March showed complete remission.  Labs reviewed and discussed with patient. Hold immunotherapy due to progressively worsening kidney function.  Questionable pneumonia therapy related versus other etiologies.  #Acute on chronic kidney failure. Creatinine increased further. We discussed about the possibilities of immunotherapy induced kidney inflammation versus unclear etiology, hypertension, diabetes. Advised patient to follow-up with nephrology Dr. Candiss Norse.  #Fatigue, TSH and cortisol level have been both normal.  Continue to monitor  # Chronic anemia, mostly due to CKD.  Hemoglobin has been stable.  Erythropoietin replacement therapy is contraindicated due to cancer. Ferritin level is two hundred fifty-six.  Microcytosis, hemoglobinopathy evaluation was done previously and was normal. She may have alpha thalassemia. I will check alpha thalassemia genotype.   #Right lower extremity  femoral vein DVT provoked by transvenous biopsy in the context of cancer recurrence, Status post IV C filter and mechanical thrombectomy.  IVC filter has been removed. Eliquis was stopped due to episode of hematuria.  Continue to monitor.  All questions were answered. The patient knows to call the clinic with any problems questions or concerns.  Return of visit: 3 weeks.    [name], MD, PhD Hematology Oncology [facility] [place] [facility] Pager- [phone] 11/17/19

## 2019-11-17 NOTE — Progress Notes (Signed)
Patient reports the abdominal pain/discomform she had for quite a while has not improved.  Says it feels like "something sticking me from the inside".  The pain is not constant but happens everyday and is 8/10 on pain scale.  Does have constipation and if she doesn't take the Miralax and Senakot she will not have a bowel movement.

## 2019-12-02 ENCOUNTER — Ambulatory Visit: Payer: Medicare Other | Admitting: Urology

## 2019-12-08 ENCOUNTER — Inpatient Hospital Stay: Payer: Medicare Other | Admitting: Oncology

## 2019-12-08 ENCOUNTER — Inpatient Hospital Stay: Payer: Medicare Other

## 2019-12-09 ENCOUNTER — Telehealth: Payer: Self-pay

## 2019-12-09 NOTE — Telephone Encounter (Signed)
Scheduling called to r/s the missed lab/MD/tx appointment yesterday (12/08/19) for this week.  Patient stated that she wanted to keep her every 3 week regimen since that is how she is scheduled off at work.  Next scheduled visit is for 3 weeks (12/29/19 per pt request.

## 2019-12-21 ENCOUNTER — Telehealth: Payer: Self-pay | Admitting: Family Medicine

## 2019-12-21 ENCOUNTER — Ambulatory Visit: Payer: Medicare Other | Admitting: Family Medicine

## 2019-12-21 NOTE — Telephone Encounter (Signed)
Called pharmacy. Pharmacy states that patient picked up both medications in May.   Called patient and discussed with her. Patient states that she was given a 30 day supply of her medications when she picked them up last and not the 90 that we wrote for. I explained that this could be her insurance only allowing to fill for 30 days at a time. I advised the patient to call her pharmacy and discuss with them as they should have plenty of refills on file, enough to get her to the November appointment.

## 2019-12-21 NOTE — Telephone Encounter (Signed)
Both sent in May for 6 month supply

## 2019-12-21 NOTE — Telephone Encounter (Signed)
Pt stated she is out of her blood pressure and cholesterol medication. Pt has apt on 04/15/2020 per last.

## 2019-12-29 ENCOUNTER — Encounter (INDEPENDENT_AMBULATORY_CARE_PROVIDER_SITE_OTHER): Payer: Self-pay

## 2019-12-29 ENCOUNTER — Inpatient Hospital Stay (HOSPITAL_BASED_OUTPATIENT_CLINIC_OR_DEPARTMENT_OTHER): Payer: Medicare Other | Admitting: Oncology

## 2019-12-29 ENCOUNTER — Inpatient Hospital Stay: Payer: Medicare Other | Attending: Oncology

## 2019-12-29 ENCOUNTER — Encounter: Payer: Self-pay | Admitting: Oncology

## 2019-12-29 ENCOUNTER — Inpatient Hospital Stay: Payer: Medicare Other

## 2019-12-29 ENCOUNTER — Other Ambulatory Visit: Payer: Self-pay

## 2019-12-29 VITALS — BP 160/74 | HR 79 | Temp 96.3°F | Resp 18 | Wt 200.8 lb

## 2019-12-29 DIAGNOSIS — C7910 Secondary malignant neoplasm of unspecified urinary organs: Secondary | ICD-10-CM

## 2019-12-29 DIAGNOSIS — I129 Hypertensive chronic kidney disease with stage 1 through stage 4 chronic kidney disease, or unspecified chronic kidney disease: Secondary | ICD-10-CM | POA: Diagnosis not present

## 2019-12-29 DIAGNOSIS — C679 Malignant neoplasm of bladder, unspecified: Secondary | ICD-10-CM | POA: Diagnosis not present

## 2019-12-29 DIAGNOSIS — D631 Anemia in chronic kidney disease: Secondary | ICD-10-CM | POA: Insufficient documentation

## 2019-12-29 DIAGNOSIS — D509 Iron deficiency anemia, unspecified: Secondary | ICD-10-CM

## 2019-12-29 DIAGNOSIS — N1831 Chronic kidney disease, stage 3a: Secondary | ICD-10-CM

## 2019-12-29 DIAGNOSIS — Z95828 Presence of other vascular implants and grafts: Secondary | ICD-10-CM

## 2019-12-29 DIAGNOSIS — R319 Hematuria, unspecified: Secondary | ICD-10-CM | POA: Diagnosis not present

## 2019-12-29 DIAGNOSIS — R109 Unspecified abdominal pain: Secondary | ICD-10-CM | POA: Insufficient documentation

## 2019-12-29 DIAGNOSIS — Z5112 Encounter for antineoplastic immunotherapy: Secondary | ICD-10-CM | POA: Insufficient documentation

## 2019-12-29 LAB — COMPREHENSIVE METABOLIC PANEL
ALT: 12 U/L (ref 0–44)
AST: 20 U/L (ref 15–41)
Albumin: 4.3 g/dL (ref 3.5–5.0)
Alkaline Phosphatase: 99 U/L (ref 38–126)
Anion gap: 10 (ref 5–15)
BUN: 29 mg/dL — ABNORMAL HIGH (ref 8–23)
CO2: 24 mmol/L (ref 22–32)
Calcium: 9.2 mg/dL (ref 8.9–10.3)
Chloride: 107 mmol/L (ref 98–111)
Creatinine, Ser: 1.6 mg/dL — ABNORMAL HIGH (ref 0.44–1.00)
GFR calc Af Amer: 37 mL/min — ABNORMAL LOW (ref >60)
GFR calc non Af Amer: 32 mL/min — ABNORMAL LOW (ref >60)
Glucose, Bld: 168 mg/dL — ABNORMAL HIGH (ref 70–99)
Potassium: 4.4 mmol/L (ref 3.5–5.1)
Sodium: 141 mmol/L (ref 135–145)
Total Bilirubin: 0.6 mg/dL (ref 0.3–1.2)
Total Protein: 7.9 g/dL (ref 6.5–8.1)

## 2019-12-29 LAB — CBC WITH DIFFERENTIAL/PLATELET
Abs Immature Granulocytes: 0.03 10*3/uL (ref 0.00–0.07)
Basophils Absolute: 0 10*3/uL (ref 0.0–0.1)
Basophils Relative: 0 %
Eosinophils Absolute: 0.1 10*3/uL (ref 0.0–0.5)
Eosinophils Relative: 3 %
HCT: 30.4 % — ABNORMAL LOW (ref 36.0–46.0)
Hemoglobin: 9.8 g/dL — ABNORMAL LOW (ref 12.0–15.0)
Immature Granulocytes: 1 %
Lymphocytes Relative: 27 %
Lymphs Abs: 1.5 10*3/uL (ref 0.7–4.0)
MCH: 24.9 pg — ABNORMAL LOW (ref 26.0–34.0)
MCHC: 32.2 g/dL (ref 30.0–36.0)
MCV: 77.2 fL — ABNORMAL LOW (ref 80.0–100.0)
Monocytes Absolute: 0.4 10*3/uL (ref 0.1–1.0)
Monocytes Relative: 8 %
Neutro Abs: 3.4 10*3/uL (ref 1.7–7.7)
Neutrophils Relative %: 61 %
Platelets: 219 10*3/uL (ref 150–400)
RBC: 3.94 MIL/uL (ref 3.87–5.11)
RDW: 13.6 % (ref 11.5–15.5)
WBC: 5.5 10*3/uL (ref 4.0–10.5)
nRBC: 0 % (ref 0.0–0.2)

## 2019-12-29 LAB — TSH: TSH: 1.944 u[IU]/mL (ref 0.350–4.500)

## 2019-12-29 MED ORDER — SODIUM CHLORIDE 0.9% FLUSH
10.0000 mL | INTRAVENOUS | Status: DC | PRN
  Administered 2019-12-29: 10 mL via INTRAVENOUS
  Filled 2019-12-29: qty 10

## 2019-12-29 MED ORDER — SODIUM CHLORIDE 0.9 % IV SOLN
Freq: Once | INTRAVENOUS | Status: AC
  Filled 2019-12-29: qty 250

## 2019-12-29 MED ORDER — SODIUM CHLORIDE 0.9 % IV SOLN
200.0000 mg | Freq: Once | INTRAVENOUS | Status: AC
  Administered 2019-12-29: 200 mg via INTRAVENOUS
  Filled 2019-12-29: qty 8

## 2019-12-29 MED ORDER — HEPARIN SOD (PORK) LOCK FLUSH 100 UNIT/ML IV SOLN
500.0000 [IU] | Freq: Once | INTRAVENOUS | Status: AC | PRN
  Administered 2019-12-29: 500 [IU]
  Filled 2019-12-29: qty 5

## 2019-12-29 NOTE — Progress Notes (Signed)
Patient reports the abdominal sharp pains are getting worse.  She feels like something is "sticking her".  The pain moves to different location of upper abdomen with the current pain on upper right side 8/10 pain scale.

## 2019-12-29 NOTE — Progress Notes (Signed)
Hematology/Oncology follow up note [facility] [phone] [phone]   Patient Care Team: Lane, Rachel Elizabeth, PA-C as PCP - General (Family Medicine) Nice, Keith B, OD (Optometry) Jaelin Devincentis, MD as Consulting Physician (Hematology and Oncology)  REFERRING PROVIDER: Dr.Sninsky CHIEF COMPLAINTS/REASON FOR VISIT:  Follow up for bladder cancer, anemia.   HISTORY OF PRESENTING ILLNESS:  Angela Russell is a  69 y.o.  female with PMH listed below who was referred to me for evaluation of newly diagnosed bladder cancer. Patient initially presented to emergency room at the end of January 2020 for evaluation of dysuria, hematuria and the left lower quadrant inguinal pain and flank pain.  1/28 2020 CT renal stone study showed suspected irregular wall thickening about the superior bladder, not well assessed due to degree of bladder distention.  Recommend cystoscopy for further evaluation.  No renal stone or obstructive uropathy.  Patient was given IV Rocephin and referred patient for outpatient urology follow-up.  Urine culture was negative.  She again presented to ER after 2 days with similar symptoms.  Patient has 25-pack-year smoking history, quit approximately 20 years ago.  No family history of any urology malignancies 07/03/2018 another CT abdomen pelvis with contrast was done which showed no nephrolithiasis or hydronephrosis is identified.  Bladder is decompressed limiting evaluation.  07/16/2018.urology Dr. Sinskey - cystoscopy and bilateral retrograde pyelogram on 07/16/2018.  Pyelogram did not show any filling defect or abnormalities.  No hydronephrosis.  Ureteral orifice was not involved with tumor.  There is a large 5 cm posterior wall bladder tumor, bullous and sessile appearing.  Patient underwent TURBT.   Pathology: High-grade urothelial carcinoma, invasive into muscularis propria.  Lymphovascular invasion is present.  Carcinoma in situ is also  identified.  Focal squamous differentiation is noted, areas of invasive carcinoma display pleomorphic/sarcomatoid changes.  T2b  08/07/2018 CT without contrast negative.  2 subpleural right upper lobe nodule 2 to 3 mm likely benign. She also had baseline audiometry done.  08/04/2018 ddMVAC x 1 cycle, stopped due to intolerance and AKI.  Patient received 1 cycle of dd MVAC, not able to tolerate due to AKI. Patient then was referred to [facility]  09/22/2018 patient underwent a cystectomy, pathology pT3a N0 Mx.  11 lymph nodes were harvested and was all negative. Invasive urothelia carcinoma, high grade, with sarcomatoid features.   10/14/2018 patient was admitted due to pyelonephritis and a pelvic fluid collection.  Drain was placed. 10/23/2018 drain was removed. Patient has had difficulties getting to her appointments to [facility]. 02/09/2019, patient presented with abdominal pain. CT concerning for small bowel obstruction and increased size of right pelvic fluid collection concerning for cancer recurrence.  Right hydronephrosis to the level of pelvis and enlarged lymph nodes. Patient had JP drain placed with CT guidance to pelvic fluid collection and drained 400 cc amber fluid.  Culture was negative for growth of microorganisms and cytology was negative for malignancy.-JP drain was removed on the day of discharge. CT-guided core biopsy of pelvic lymph node adenopathy was attempted but not successful.  # Right lower extremity DVT, provoked by Transvenous biopsy  02/26/2019 transvenous biopsy by IR unsuccessful transvenous biopsy of the right pelvis mass. Post biopsy acute thrombus in the right external iliac and common femoral vein.  Patient was recommended to start anticoagulation with Xarelto however due to the co-pay, patient is not able to afford the medication. Anticoagulation regimen was switched to Eliquis 5 mg.  Patient reports that she has been taking   it once a day. 03/20/2019 PET showed  FDG avid tissue in the cystectomy bed, retroperitoneal and pelvic lymphadenopathy.  Right common iliac DVT.  # increased right lower extremity swelling.  She was started on Eliquis for anticoagulation by [facility].  We clarified with her pharmacy and she was actually taking Eliquis 2.5 mg twice daily. Right lower extremity swelling has not improved but instead worsened. 03/16/2019 She had ultrasound right lower extremity done which showed persistent extensive proximal right lower extremity DVT.  Anticoagulation regimen has increased to Eliquis 5 mg twice daily.  # establish care with [facility] oncology Dr. [name] for evaluation.  Dr. [name] recommended starting immunotherapy with PD-L1 inhibitor Pembrolizumab 200 mg every 3 weeks.  The sarcomatoid histology may not respond to chemotherapy well, could portend a better chance of response into immunotherapy PET scan after 4 cycles of Keytruda showed single mildly enlarged central mesenteric lymph node in the upper pelvis with SUV 9.6.  No other abnormal hypermetabolic activity was reported.  # 03/30/2019 Status post IVC filter placement, mechanical thrombectomy.-IVC filter was retrieved in January 2021. # PD-L1 CPS 100%.  # 03/31/2019 started on immunotherapy Keytruda.   INTERVAL HISTORY Angela Russell is a 69 y.o. female who has above history reviewed by me today presents for follow up visit for evaluation form metastatic high-grade urothelial carcinoma of the bladder. Sarcomatoid feature.  Patient has missed a few appointments.  Today she continues to have abdominal pain, the pain moves around and it feels like something sticking from inside.  Denies any nausea, vomiting, bowel habit changes, no fever or chills.   Review of Systems  Constitutional: Negative for appetite change, chills, fatigue and fever.  HENT:   Negative for hearing loss and voice change.   Eyes: Negative for eye problems.  Respiratory: Negative for chest tightness and cough.     Cardiovascular: Negative for chest pain and leg swelling.  Gastrointestinal: Positive for abdominal pain. Negative for abdominal distention and blood in stool.  Endocrine: Negative for hot flashes.  Genitourinary: Negative for difficulty urinating and frequency.   Musculoskeletal: Negative for arthralgias.  Skin: Negative for itching and rash.  Neurological: Positive for numbness. Negative for extremity weakness.  Hematological: Negative for adenopathy.  Psychiatric/Behavioral: Negative for confusion. The patient is not nervous/anxious.     MEDICAL HISTORY:  Past Medical History:  Diagnosis Date  . Carotid artery plaque, right 01/2014  . CKD (chronic kidney disease)    stage 2-3  . Diabetes mellitus without complication ([place])   . DM (diabetes mellitus), type 2, uncontrolled ([place])   . Hyperlipidemia   . Hypertension   . Hypochromic microcytic anemia    mild  . Metastatic urothelial carcinoma ([place]) 03/23/2019  . Osteoporosis   . Renal insufficiency   . Rotator cuff tendonitis, right     SURGICAL HISTORY: Past Surgical History:  Procedure Laterality Date  . ABDOMINAL HYSTERECTOMY  2000   due to bleeding and fibroids, partial- still has ovaries  . CYSTOSCOPY W/ RETROGRADES Bilateral 07/16/2018   Procedure: CYSTOSCOPY WITH RETROGRADE PYELOGRAM;  Surgeon: Billey Co, MD;  Location: [facility];  Service: Urology;  Laterality: Bilateral;  . IVC FILTER INSERTION N/A 03/30/2019   Procedure: IVC FILTER INSERTION;  Surgeon: Algernon Huxley, MD;  Location: [place] [facility];  Service: Cardiovascular;  Laterality: N/A;  . IVC FILTER REMOVAL N/A 06/15/2019   Procedure: IVC FILTER REMOVAL;  Surgeon: Algernon Huxley, MD;  Location: [place] [facility];  Service: Cardiovascular;  Laterality:  N/A;  . KNEE SURGERY Left 03/17/2013   torn meniscus  . PERIPHERAL VASCULAR THROMBECTOMY Right 03/30/2019   Procedure: PERIPHERAL VASCULAR THROMBECTOMY;  Surgeon: Algernon Huxley, MD;  Location: [place] [facility];  Service: Cardiovascular;  Laterality: Right;  . PORTA CATH INSERTION N/A 08/06/2018   Procedure: PORTA CATH INSERTION;  Surgeon: Algernon Huxley, MD;  Location: [place] [facility];  Service: Cardiovascular;  Laterality: N/A;  . TRANSURETHRAL RESECTION OF BLADDER TUMOR N/A 07/16/2018   Procedure: TRANSURETHRAL RESECTION OF BLADDER TUMOR (TURBT);  Surgeon: Billey Co, MD;  Location: [facility];  Service: Urology;  Laterality: N/A;    SOCIAL HISTORY: Social History   Socioeconomic History  . Marital status: Single    Spouse name: Not on file  . Number of children: 1  . Years of education: Not on file  . Highest education level: 10th grade  Occupational History  . Not on file  Tobacco Use  . Smoking status: Former Smoker    Quit date: 06/05/1991    Years since quitting: 28.5  . Smokeless tobacco: Never Used  Vaping Use  . Vaping Use: Never used  Substance and Sexual Activity  . Alcohol use: No  . Drug use: No  . Sexual activity: Never  Other Topics Concern  . Not on file  Social History Narrative   Working full time   Social Determinants of Radio broadcast assistant Strain:   . Difficulty of Paying Living Expenses:   Food Insecurity:   . Worried About Charity fundraiser in the Last Year:   . Arboriculturist in the Last Year:   Transportation Needs:   . Film/video editor (Medical):   Marland Kitchen Lack of Transportation (Non-Medical):   Physical Activity:   . Days of Exercise per Week:   . Minutes of Exercise per Session:   Stress:   . Feeling of Stress :   Social Connections:   . Frequency of Communication with Friends and Family:   . Frequency of Social Gatherings with Friends and Family:   . Attends Religious Services:   . Active Member of Clubs or Organizations:   . Attends Archivist Meetings:   Marland Kitchen Marital Status:   Intimate Partner Violence:   . Fear of Current or Ex-Partner:   . Emotionally Abused:   Marland Kitchen Physically Abused:   . Sexually  Abused:     FAMILY HISTORY: Family History  Problem Relation Age of Onset  . Heart disease Mother   . Heart attack Mother   . Arthritis Father   . Diabetes Brother     ALLERGIES:  has No Known Allergies.  MEDICATIONS:  Current Outpatient Medications  Medication Sig Dispense Refill  . amLODipine (NORVASC) 5 MG tablet Take 1 tablet (5 mg total) by mouth daily. 90 tablet 1  . diclofenac sodium (VOLTAREN) 1 % GEL Apply 2 g topically 4 (four) times daily. 100 g 2  . lidocaine-prilocaine (EMLA) cream Apply to affected area once 30 g 3  . polyethylene glycol (MIRALAX / GLYCOLAX) 17 g packet Take 17 g by mouth daily as needed for mild constipation. 14 each 0  . rosuvastatin (CRESTOR) 40 MG tablet Take 1 tablet (40 mg total) by mouth daily. 90 tablet 1  . senna-docusate (SENOKOT-S) 8.6-50 MG tablet Take 1 tablet by mouth 2 (two) times daily. OK to hold or decrease to 1x per day if diarrhea (Patient not taking: Reported on 12/29/2019) 30 tablet 3  No current facility-administered medications for this visit.     PHYSICAL EXAMINATION: ECOG PERFORMANCE STATUS: 1 - Symptomatic but completely ambulatory Vitals:   12/29/19 0958  BP: (!) 160/74  Pulse: 79  Resp: 18  Temp: (!) 96.3 F (35.7 C)   Filed Weights   12/29/19 0958  Weight: 200 lb 12.8 oz (91.1 kg)    Physical Exam Constitutional:      General: She is not in acute distress.    Comments: Walk independently  HENT:     Head: Normocephalic and atraumatic.  Eyes:     General: No scleral icterus.    Pupils: Pupils are equal, round, and reactive to light.  Cardiovascular:     Rate and Rhythm: Normal rate and regular rhythm.     Heart sounds: Normal heart sounds.  Pulmonary:     Effort: Pulmonary effort is normal. No respiratory distress.     Breath sounds: No wheezing.  Abdominal:     General: Bowel sounds are normal. There is no distension.     Palpations: Abdomen is soft. There is no mass.  Musculoskeletal:         General: No deformity. Normal range of motion.     Cervical back: Normal range of motion and neck supple.  Skin:    General: Skin is warm and dry.     Coloration: Skin is not pale.     Findings: No erythema or rash.     Comments: Ureterostomy, with light yellow color urine in the ostomy bag.  Neurological:     Mental Status: She is alert and oriented to person, place, and time. Mental status is at baseline.     Cranial Nerves: No cranial nerve deficit.     Coordination: Coordination normal.  Psychiatric:        Mood and Affect: Mood normal.     RADIOGRAPHIC STUDIES: I have personally reviewed the radiological images as listed and agreed with the findings in the report.  CMP Latest Ref Rng & Units 12/29/2019  Glucose 70 - 99 mg/dL 168(H)  BUN 8 - 23 mg/dL 29(H)  Creatinine 0.44 - 1.00 mg/dL 1.60(H)  Sodium 135 - 145 mmol/L 141  Potassium 3.5 - 5.1 mmol/L 4.4  Chloride 98 - 111 mmol/L 107  CO2 22 - 32 mmol/L 24  Calcium 8.9 - 10.3 mg/dL 9.2  Total Protein 6.5 - 8.1 g/dL 7.9  Total Bilirubin 0.3 - 1.2 mg/dL 0.6  Alkaline Phos 38 - 126 U/L 99  AST 15 - 41 U/L 20  ALT 0 - 44 U/L 12   CBC Latest Ref Rng & Units 12/29/2019  WBC 4.0 - 10.5 K/uL 5.5  Hemoglobin 12.0 - 15.0 g/dL 9.8(L)  Hematocrit 36 - 46 % 30.4(L)  Platelets 150 - 400 K/uL 219    LABORATORY DATA:  I have reviewed the data as listed Lab Results  Component Value Date   WBC 5.5 12/29/2019   HGB 9.8 (L) 12/29/2019   HCT 30.4 (L) 12/29/2019   MCV 77.2 (L) 12/29/2019   PLT 219 12/29/2019   Recent Labs    10/27/19 0844 11/17/19 0936 12/29/19 0935  NA 139 140 141  K 4.4 4.6 4.4  CL 108 107 107  CO2 '22 23 24  '$ GLUCOSE 232* 143* 168*  BUN 39* 37* 29*  CREATININE 1.71* 1.83* 1.60*  CALCIUM 9.0 9.0 9.2  GFRNONAA 30* 27* 32*  GFRAA 35* 32* 37*  PROT 7.4 7.9 7.9  ALBUMIN 4.0 4.1 4.3  AST  $'22 19 20  'X$ ALT '14 12 12  '$ ALKPHOS 96 99 99  BILITOT 0.5 0.5 0.6   Iron/TIBC/Ferritin/ %Sat    Component Value  Date/Time   IRON 73 11/17/2019 0936   IRON 26 (L) 02/04/2019 1309   TIBC 316 11/17/2019 0936   TIBC 251 02/04/2019 1309   FERRITIN 256 11/17/2019 0936   FERRITIN 409 (H) 02/04/2019 1309   IRONPCTSAT 23 11/17/2019 0936   IRONPCTSAT 10 (L) 02/04/2019 1309    RADIOGRAPHIC STUDIES: I have personally reviewed the radiological images as listed and agreed with the findings in the report. VAS [place] IVC/ILIAC (VENOUS ONLY)  Result Date: 11/04/2019 IVC/ILIAC STUDY Limitations: Air/bowel gas, obesity and Ostomy bag.  Performing Technologist: Charlane Ferretti RT (R)(VS)  Examination Guidelines: A complete evaluation includes B-mode imaging, spectral Doppler, color Doppler, and power Doppler as needed of all accessible portions of each vessel. Bilateral testing is considered an integral part of a complete examination. Limited examinations for reoccurring indications may be performed as noted.  IVC/Iliac Findings: +----------+------+--------+--------+    IVC    PatentThrombusComments +----------+------+--------+--------+ IVC Prox  patent                 +----------+------+--------+--------+ IVC Mid   patent                 +----------+------+--------+--------+ IVC Distalpatent                 +----------+------+--------+--------+  +-----------------+---------+-----------+---------+-----------+--------+        CIV       RT-PatentRT-ThrombusLT-PatentLT-ThrombusComments +-----------------+---------+-----------+---------+-----------+--------+ Common Iliac Prox patent              patent                      +-----------------+---------+-----------+---------+-----------+--------+ Common Iliac Mid  patent              patent                      +-----------------+---------+-----------+---------+-----------+--------+  Summary: IVC/Iliac: No evidence of abdnormal dilitation was noted in the Inferior Vena Cava and common Iliac artery. There is no evidence of thrombus involving the right  common iliac vein and left common iliac vein. Patent right iliac vein stent.  *See table(s) above for measurements and observations.  Electronically signed by Leotis Pain MD on 11/04/2019 at 12:46:40 PM.   Final    VAS [place] ABI WITH/WO TBI  Result Date: 11/04/2019 LOWER EXTREMITY DOPPLER STUDY Indications: Rest pain, and peripheral artery disease.  Performing Technologist: Charlane Ferretti RT (R)(VS)  Examination Guidelines: A complete evaluation includes at minimum, Doppler waveform signals and systolic blood pressure reading at the level of bilateral brachial, anterior tibial, and posterior tibial arteries, when vessel segments are accessible. Bilateral testing is considered an integral part of a complete examination. Photoelectric Plethysmograph (PPG) waveforms and toe systolic pressure readings are included as required and additional duplex testing as needed. Limited examinations for reoccurring indications may be performed as noted.  ABI Findings: +---------+------------------+-----+---------+--------+ Right    Rt Pressure (mmHg)IndexWaveform Comment  +---------+------------------+-----+---------+--------+ Brachial 164                                      +---------+------------------+-----+---------+--------+ ATA      196               1.18 triphasic         +---------+------------------+-----+---------+--------+  PTA      190               1.14 triphasic         +---------+------------------+-----+---------+--------+ Great Toe195               1.17 Normal            +---------+------------------+-----+---------+--------+ +---------+------------------+-----+---------+-------+ Left     Lt Pressure (mmHg)IndexWaveform Comment +---------+------------------+-----+---------+-------+ Brachial 166                                     +---------+------------------+-----+---------+-------+ ATA      192               1.16 triphasic         +---------+------------------+-----+---------+-------+ PTA      192               1.16 triphasic        +---------+------------------+-----+---------+-------+ Great Toe249               1.50 Normal           +---------+------------------+-----+---------+-------+ Summary: Right: Resting right ankle-brachial index is within normal range. No evidence of significant right lower extremity arterial disease. The right toe-brachial index is normal. Left: Resting left ankle-brachial index is within normal range. No evidence of significant left lower extremity arterial disease. The left toe-brachial index is normal. *See table(s) above for measurements and observations.  Electronically signed by Leotis Pain MD on 11/04/2019 at 12:46:35 PM.   Final      ASSESSMENT & PLAN:  1. Metastatic urothelial carcinoma ([place])   2. Port-A-Cath in place   3. Anemia in stage 3a chronic kidney disease   4. Encounter for antineoplastic immunotherapy   Cancer Staging Bladder carcinoma [facility]) Staging form: Urinary Bladder, AJCC 8th Edition - Clinical stage from 08/01/2018: Stage IVA (cTX, cN3, cM1a) - Signed by [name], MD on 04/21/2019  #Metastatic urothelial carcinoma of bladder, sarcomatoid features pelvic sidewall mass showed metastatic carcinoma consistent with involvement by urothelial carcinoma. Patient has been on Keytruda every 3 weeks and most recent PET scan at the end of March showed complete remission.  Labs are reviewed and discussed with patient. Resume Keytruda today.  TSH has been monitored. Obtain CT chest abdomen pelvis without contrast.   #CKD, creatinine level is at her baseline. #Anemia secondary to chronic kidney disease, hemoglobin stable Monitor Microcytosis, hemoglobinopathy evaluation was done previously and was normal. She may have alpha thalassemia. I will check alpha thalassemia genotype.   #Right lower extremity  femoral vein DVT provoked by transvenous biopsy in the context of  cancer recurrence, Status post IV C filter and mechanical thrombectomy.  IVC filter has been removed. She is currently off anticoagulation due to hematuria.  #Abdominal pain, unknown etiology.  Pending CT scanning for further evaluation.  All questions were answered. The patient knows to call the clinic with any problems questions or concerns.  Return of visit: 3 weeks.    [name], MD, PhD Hematology Oncology [facility] Pager- [phone] 12/29/19

## 2020-01-01 DIAGNOSIS — E785 Hyperlipidemia, unspecified: Secondary | ICD-10-CM | POA: Diagnosis not present

## 2020-01-01 DIAGNOSIS — G5601 Carpal tunnel syndrome, right upper limb: Secondary | ICD-10-CM | POA: Diagnosis not present

## 2020-01-01 DIAGNOSIS — Z7901 Long term (current) use of anticoagulants: Secondary | ICD-10-CM | POA: Diagnosis not present

## 2020-01-01 DIAGNOSIS — E1122 Type 2 diabetes mellitus with diabetic chronic kidney disease: Secondary | ICD-10-CM | POA: Diagnosis not present

## 2020-01-01 DIAGNOSIS — E7800 Pure hypercholesterolemia, unspecified: Secondary | ICD-10-CM | POA: Diagnosis not present

## 2020-01-01 DIAGNOSIS — Z7984 Long term (current) use of oral hypoglycemic drugs: Secondary | ICD-10-CM | POA: Diagnosis not present

## 2020-01-01 DIAGNOSIS — I129 Hypertensive chronic kidney disease with stage 1 through stage 4 chronic kidney disease, or unspecified chronic kidney disease: Secondary | ICD-10-CM | POA: Diagnosis not present

## 2020-01-01 DIAGNOSIS — Z87891 Personal history of nicotine dependence: Secondary | ICD-10-CM | POA: Diagnosis not present

## 2020-01-01 DIAGNOSIS — N189 Chronic kidney disease, unspecified: Secondary | ICD-10-CM | POA: Diagnosis not present

## 2020-01-04 DIAGNOSIS — N1832 Chronic kidney disease, stage 3b: Secondary | ICD-10-CM | POA: Diagnosis not present

## 2020-01-04 DIAGNOSIS — E1122 Type 2 diabetes mellitus with diabetic chronic kidney disease: Secondary | ICD-10-CM | POA: Diagnosis not present

## 2020-01-04 DIAGNOSIS — I10 Essential (primary) hypertension: Secondary | ICD-10-CM | POA: Diagnosis not present

## 2020-01-06 ENCOUNTER — Other Ambulatory Visit: Payer: Self-pay

## 2020-01-06 ENCOUNTER — Ambulatory Visit
Admission: RE | Admit: 2020-01-06 | Discharge: 2020-01-06 | Disposition: A | Discharge status: Home / Self Care | Payer: Medicare Other | Source: Ambulatory Visit | Attending: Oncology | Admitting: Oncology

## 2020-01-06 DIAGNOSIS — J984 Other disorders of lung: Secondary | ICD-10-CM | POA: Diagnosis not present

## 2020-01-06 DIAGNOSIS — J439 Emphysema, unspecified: Secondary | ICD-10-CM | POA: Diagnosis not present

## 2020-01-06 DIAGNOSIS — M533 Sacrococcygeal disorders, not elsewhere classified: Secondary | ICD-10-CM | POA: Diagnosis not present

## 2020-01-06 DIAGNOSIS — C7910 Secondary malignant neoplasm of unspecified urinary organs: Secondary | ICD-10-CM

## 2020-01-06 DIAGNOSIS — C679 Malignant neoplasm of bladder, unspecified: Secondary | ICD-10-CM | POA: Diagnosis not present

## 2020-01-06 DIAGNOSIS — I318 Other specified diseases of pericardium: Secondary | ICD-10-CM | POA: Diagnosis not present

## 2020-01-06 DIAGNOSIS — I2510 Atherosclerotic heart disease of native coronary artery without angina pectoris: Secondary | ICD-10-CM | POA: Diagnosis not present

## 2020-01-06 DIAGNOSIS — I700 Atherosclerosis of aorta: Secondary | ICD-10-CM | POA: Diagnosis not present

## 2020-01-06 LAB — ALPHA-THALASSEMIA GENOTYPR

## 2020-01-19 ENCOUNTER — Inpatient Hospital Stay (HOSPITAL_BASED_OUTPATIENT_CLINIC_OR_DEPARTMENT_OTHER): Payer: Medicare Other | Admitting: Oncology

## 2020-01-19 ENCOUNTER — Other Ambulatory Visit: Payer: Self-pay

## 2020-01-19 ENCOUNTER — Inpatient Hospital Stay: Payer: Medicare Other

## 2020-01-19 ENCOUNTER — Inpatient Hospital Stay: Payer: Medicare Other | Attending: Oncology

## 2020-01-19 ENCOUNTER — Encounter: Payer: Self-pay | Admitting: Oncology

## 2020-01-19 VITALS — BP 133/71 | HR 88 | Temp 97.8°F | Resp 20 | Wt 203.3 lb

## 2020-01-19 VITALS — BP 136/77 | HR 80 | Resp 18

## 2020-01-19 DIAGNOSIS — C7910 Secondary malignant neoplasm of unspecified urinary organs: Secondary | ICD-10-CM

## 2020-01-19 DIAGNOSIS — C679 Malignant neoplasm of bladder, unspecified: Secondary | ICD-10-CM | POA: Insufficient documentation

## 2020-01-19 DIAGNOSIS — I129 Hypertensive chronic kidney disease with stage 1 through stage 4 chronic kidney disease, or unspecified chronic kidney disease: Secondary | ICD-10-CM | POA: Insufficient documentation

## 2020-01-19 DIAGNOSIS — Z86718 Personal history of other venous thrombosis and embolism: Secondary | ICD-10-CM | POA: Diagnosis not present

## 2020-01-19 DIAGNOSIS — D631 Anemia in chronic kidney disease: Secondary | ICD-10-CM | POA: Diagnosis not present

## 2020-01-19 DIAGNOSIS — N1831 Chronic kidney disease, stage 3a: Secondary | ICD-10-CM | POA: Diagnosis not present

## 2020-01-19 DIAGNOSIS — K5900 Constipation, unspecified: Secondary | ICD-10-CM

## 2020-01-19 DIAGNOSIS — E785 Hyperlipidemia, unspecified: Secondary | ICD-10-CM | POA: Diagnosis not present

## 2020-01-19 DIAGNOSIS — Z79899 Other long term (current) drug therapy: Secondary | ICD-10-CM | POA: Insufficient documentation

## 2020-01-19 DIAGNOSIS — D509 Iron deficiency anemia, unspecified: Secondary | ICD-10-CM | POA: Diagnosis not present

## 2020-01-19 DIAGNOSIS — I700 Atherosclerosis of aorta: Secondary | ICD-10-CM | POA: Diagnosis not present

## 2020-01-19 DIAGNOSIS — M810 Age-related osteoporosis without current pathological fracture: Secondary | ICD-10-CM | POA: Insufficient documentation

## 2020-01-19 DIAGNOSIS — E1165 Type 2 diabetes mellitus with hyperglycemia: Secondary | ICD-10-CM | POA: Diagnosis not present

## 2020-01-19 DIAGNOSIS — N183 Chronic kidney disease, stage 3 unspecified: Secondary | ICD-10-CM | POA: Insufficient documentation

## 2020-01-19 DIAGNOSIS — Z87891 Personal history of nicotine dependence: Secondary | ICD-10-CM | POA: Diagnosis not present

## 2020-01-19 DIAGNOSIS — Z5112 Encounter for antineoplastic immunotherapy: Secondary | ICD-10-CM

## 2020-01-19 DIAGNOSIS — J439 Emphysema, unspecified: Secondary | ICD-10-CM | POA: Insufficient documentation

## 2020-01-19 DIAGNOSIS — R109 Unspecified abdominal pain: Secondary | ICD-10-CM | POA: Insufficient documentation

## 2020-01-19 LAB — COMPREHENSIVE METABOLIC PANEL
ALT: 11 U/L (ref 0–44)
AST: 19 U/L (ref 15–41)
Albumin: 4.5 g/dL (ref 3.5–5.0)
Alkaline Phosphatase: 93 U/L (ref 38–126)
Anion gap: 11 (ref 5–15)
BUN: 29 mg/dL — ABNORMAL HIGH (ref 8–23)
CO2: 21 mmol/L — ABNORMAL LOW (ref 22–32)
Calcium: 9.3 mg/dL (ref 8.9–10.3)
Chloride: 109 mmol/L (ref 98–111)
Creatinine, Ser: 1.53 mg/dL — ABNORMAL HIGH (ref 0.44–1.00)
GFR calc Af Amer: 40 mL/min — ABNORMAL LOW (ref >60)
GFR calc non Af Amer: 34 mL/min — ABNORMAL LOW (ref >60)
Glucose, Bld: 155 mg/dL — ABNORMAL HIGH (ref 70–99)
Potassium: 4.2 mmol/L (ref 3.5–5.1)
Sodium: 141 mmol/L (ref 135–145)
Total Bilirubin: 0.5 mg/dL (ref 0.3–1.2)
Total Protein: 8.4 g/dL — ABNORMAL HIGH (ref 6.5–8.1)

## 2020-01-19 LAB — CBC WITH DIFFERENTIAL/PLATELET
Abs Immature Granulocytes: 0.01 10*3/uL (ref 0.00–0.07)
Basophils Absolute: 0 10*3/uL (ref 0.0–0.1)
Basophils Relative: 1 %
Eosinophils Absolute: 0.1 10*3/uL (ref 0.0–0.5)
Eosinophils Relative: 2 %
HCT: 31.7 % — ABNORMAL LOW (ref 36.0–46.0)
Hemoglobin: 10 g/dL — ABNORMAL LOW (ref 12.0–15.0)
Immature Granulocytes: 0 %
Lymphocytes Relative: 28 %
Lymphs Abs: 1.5 10*3/uL (ref 0.7–4.0)
MCH: 24.4 pg — ABNORMAL LOW (ref 26.0–34.0)
MCHC: 31.5 g/dL (ref 30.0–36.0)
MCV: 77.5 fL — ABNORMAL LOW (ref 80.0–100.0)
Monocytes Absolute: 0.5 10*3/uL (ref 0.1–1.0)
Monocytes Relative: 9 %
Neutro Abs: 3.2 10*3/uL (ref 1.7–7.7)
Neutrophils Relative %: 60 %
Platelets: 218 10*3/uL (ref 150–400)
RBC: 4.09 MIL/uL (ref 3.87–5.11)
RDW: 13.6 % (ref 11.5–15.5)
WBC: 5.4 10*3/uL (ref 4.0–10.5)
nRBC: 0 % (ref 0.0–0.2)

## 2020-01-19 MED ORDER — DOCUSATE SODIUM 100 MG PO CAPS
100.0000 mg | ORAL_CAPSULE | Freq: Every day | ORAL | 1 refills | Status: DC
Start: 2020-01-19 — End: 2020-03-11

## 2020-01-19 MED ORDER — SODIUM CHLORIDE 0.9 % IV SOLN
200.0000 mg | Freq: Once | INTRAVENOUS | Status: AC
  Administered 2020-01-19: 200 mg via INTRAVENOUS
  Filled 2020-01-19: qty 8

## 2020-01-19 MED ORDER — SODIUM CHLORIDE 0.9 % IV SOLN
Freq: Once | INTRAVENOUS | Status: AC
  Filled 2020-01-19: qty 250

## 2020-01-19 MED ORDER — HEPARIN SOD (PORK) LOCK FLUSH 100 UNIT/ML IV SOLN
INTRAVENOUS | Status: AC
  Filled 2020-01-19: qty 5

## 2020-01-19 MED ORDER — HEPARIN SOD (PORK) LOCK FLUSH 100 UNIT/ML IV SOLN
500.0000 [IU] | Freq: Once | INTRAVENOUS | Status: AC | PRN
  Administered 2020-01-19: 500 [IU]
  Filled 2020-01-19: qty 5

## 2020-01-19 NOTE — Progress Notes (Signed)
Hematology/Oncology follow up note [facility] [phone] [phone]   Patient Care Team: Lane, Rachel Elizabeth, PA-C as PCP - General (Family Medicine) Nice, Keith B, OD (Optometry) Aldwin Micalizzi, MD as Consulting Physician (Hematology and Oncology)  REFERRING PROVIDER: Dr.Sninsky CHIEF COMPLAINTS/REASON FOR VISIT:  Follow up for bladder cancer, anemia.   HISTORY OF PRESENTING ILLNESS:  Angela Russell is a  69 y.o.  female with PMH listed below who was referred to me for evaluation of newly diagnosed bladder cancer. Patient initially presented to emergency room at the end of January 2020 for evaluation of dysuria, hematuria and the left lower quadrant inguinal pain and flank pain.  1/28 2020 CT renal stone study showed suspected irregular wall thickening about the superior bladder, not well assessed due to degree of bladder distention.  Recommend cystoscopy for further evaluation.  No renal stone or obstructive uropathy.  Patient was given IV Rocephin and referred patient for outpatient urology follow-up.  Urine culture was negative.  She again presented to ER after 2 days with similar symptoms.  Patient has 25-pack-year smoking history, quit approximately 20 years ago.  No family history of any urology malignancies 07/03/2018 another CT abdomen pelvis with contrast was done which showed no nephrolithiasis or hydronephrosis is identified.  Bladder is decompressed limiting evaluation.  07/16/2018.urology Dr. Sinskey - cystoscopy and bilateral retrograde pyelogram on 07/16/2018.  Pyelogram did not show any filling defect or abnormalities.  No hydronephrosis.  Ureteral orifice was not involved with tumor.  There is a large 5 cm posterior wall bladder tumor, bullous and sessile appearing.  Patient underwent TURBT.   Pathology: High-grade urothelial carcinoma, invasive into muscularis propria.  Lymphovascular invasion is present.  Carcinoma in situ is also  identified.  Focal squamous differentiation is noted, areas of invasive carcinoma display pleomorphic/sarcomatoid changes.  T2b  08/07/2018 CT without contrast negative.  2 subpleural right upper lobe nodule 2 to 3 mm likely benign. She also had baseline audiometry done.  08/04/2018 ddMVAC x 1 cycle, stopped due to intolerance and AKI.  Patient received 1 cycle of dd MVAC, not able to tolerate due to AKI. Patient then was referred to [facility]  09/22/2018 patient underwent a cystectomy, pathology pT3a N0 Mx.  11 lymph nodes were harvested and was all negative. Invasive urothelia carcinoma, high grade, with sarcomatoid features.   10/14/2018 patient was admitted due to pyelonephritis and a pelvic fluid collection.  Drain was placed. 10/23/2018 drain was removed. Patient has had difficulties getting to her appointments to [facility]. 02/09/2019, patient presented with abdominal pain. CT concerning for small bowel obstruction and increased size of right pelvic fluid collection concerning for cancer recurrence.  Right hydronephrosis to the level of pelvis and enlarged lymph nodes. Patient had JP drain placed with CT guidance to pelvic fluid collection and drained 400 cc amber fluid.  Culture was negative for growth of microorganisms and cytology was negative for malignancy.-JP drain was removed on the day of discharge. CT-guided core biopsy of pelvic lymph node adenopathy was attempted but not successful.  # Right lower extremity DVT, provoked by Transvenous biopsy  02/26/2019 transvenous biopsy by IR unsuccessful transvenous biopsy of the right pelvis mass. Post biopsy acute thrombus in the right external iliac and common femoral vein.  Patient was recommended to start anticoagulation with Xarelto however due to the co-pay, patient is not able to afford the medication. Anticoagulation regimen was switched to Eliquis 5 mg.  Patient reports that she has been taking   it once a day. 03/20/2019 PET showed  FDG avid tissue in the cystectomy bed, retroperitoneal and pelvic lymphadenopathy.  Right common iliac DVT.  # increased right lower extremity swelling.  She was started on Eliquis for anticoagulation by [facility].  We clarified with her pharmacy and she was actually taking Eliquis 2.5 mg twice daily. Right lower extremity swelling has not improved but instead worsened. 03/16/2019 She had ultrasound right lower extremity done which showed persistent extensive proximal right lower extremity DVT.  Anticoagulation regimen has increased to Eliquis 5 mg twice daily.  # establish care with [facility] oncology Dr. [name] for evaluation.  Dr. [name] recommended starting immunotherapy with PD-L1 inhibitor Pembrolizumab 200 mg every 3 weeks.  The sarcomatoid histology may not respond to chemotherapy well, could portend a better chance of response into immunotherapy PET scan after 4 cycles of Keytruda showed single mildly enlarged central mesenteric lymph node in the upper pelvis with SUV 9.6.  No other abnormal hypermetabolic activity was reported.  # 03/30/2019 Status post IVC filter placement, mechanical thrombectomy.-IVC filter was retrieved in January 2021. # PD-L1 CPS 100%.  # 03/31/2019 started on immunotherapy Keytruda.   INTERVAL HISTORY Angela Russell is a 69 y.o. female who has above history reviewed by me today presents for follow up visit for evaluation form metastatic high-grade urothelial carcinoma of the bladder. Sarcomatoid feature.  Patient has been on immunotherapy. She continues to have abdomen discomfort. She feels maybe she is constipated.   Review of Systems  Constitutional: Negative for appetite change, chills, fatigue and fever.  HENT:   Negative for hearing loss and voice change.   Eyes: Negative for eye problems.  Respiratory: Negative for chest tightness and cough.   Cardiovascular: Negative for chest pain and leg swelling.  Gastrointestinal: Negative for abdominal distention,  abdominal pain and blood in stool.       Abdominal discomfort.   Endocrine: Negative for hot flashes.  Genitourinary: Negative for difficulty urinating and frequency.   Musculoskeletal: Negative for arthralgias.  Skin: Negative for itching and rash.  Neurological: Positive for numbness. Negative for extremity weakness.  Hematological: Negative for adenopathy.  Psychiatric/Behavioral: Negative for confusion. The patient is not nervous/anxious.     MEDICAL HISTORY:  Past Medical History:  Diagnosis Date  . Carotid artery plaque, right 01/2014  . CKD (chronic kidney disease)    stage 2-3  . Diabetes mellitus without complication ([place])   . DM (diabetes mellitus), type 2, uncontrolled ([place])   . Hyperlipidemia   . Hypertension   . Hypochromic microcytic anemia    mild  . Metastatic urothelial carcinoma ([place]) 03/23/2019  . Osteoporosis   . Renal insufficiency   . Rotator cuff tendonitis, right     SURGICAL HISTORY: Past Surgical History:  Procedure Laterality Date  . ABDOMINAL HYSTERECTOMY  2000   due to bleeding and fibroids, partial- still has ovaries  . CYSTOSCOPY W/ RETROGRADES Bilateral 07/16/2018   Procedure: CYSTOSCOPY WITH RETROGRADE PYELOGRAM;  Surgeon: Billey Co, MD;  Location: [facility];  Service: Urology;  Laterality: Bilateral;  . IVC FILTER INSERTION N/A 03/30/2019   Procedure: IVC FILTER INSERTION;  Surgeon: Algernon Huxley, MD;  Location: [place] [facility];  Service: Cardiovascular;  Laterality: N/A;  . IVC FILTER REMOVAL N/A 06/15/2019   Procedure: IVC FILTER REMOVAL;  Surgeon: Algernon Huxley, MD;  Location: [place] [facility];  Service: Cardiovascular;  Laterality: N/A;  . KNEE SURGERY Left 03/17/2013   torn meniscus  . PERIPHERAL VASCULAR  THROMBECTOMY Right 03/30/2019   Procedure: PERIPHERAL VASCULAR THROMBECTOMY;  Surgeon: Algernon Huxley, MD;  Location: [place] [facility];  Service: Cardiovascular;  Laterality: Right;  . PORTA CATH INSERTION N/A 08/06/2018     Procedure: PORTA CATH INSERTION;  Surgeon: Algernon Huxley, MD;  Location: [place] [facility];  Service: Cardiovascular;  Laterality: N/A;  . TRANSURETHRAL RESECTION OF BLADDER TUMOR N/A 07/16/2018   Procedure: TRANSURETHRAL RESECTION OF BLADDER TUMOR (TURBT);  Surgeon: Billey Co, MD;  Location: [facility];  Service: Urology;  Laterality: N/A;    SOCIAL HISTORY: Social History   Socioeconomic History  . Marital status: Single    Spouse name: Not on file  . Number of children: 1  . Years of education: Not on file  . Highest education level: 10th grade  Occupational History  . Not on file  Tobacco Use  . Smoking status: Former Smoker    Quit date: 06/05/1991    Years since quitting: 28.6  . Smokeless tobacco: Never Used  Vaping Use  . Vaping Use: Never used  Substance and Sexual Activity  . Alcohol use: No  . Drug use: No  . Sexual activity: Never  Other Topics Concern  . Not on file  Social History Narrative   Working full time   Social Determinants of Radio broadcast assistant Strain:   . Difficulty of Paying Living Expenses:   Food Insecurity:   . Worried About Charity fundraiser in the Last Year:   . Arboriculturist in the Last Year:   Transportation Needs:   . Film/video editor (Medical):   Marland Kitchen Lack of Transportation (Non-Medical):   Physical Activity:   . Days of Exercise per Week:   . Minutes of Exercise per Session:   Stress:   . Feeling of Stress :   Social Connections:   . Frequency of Communication with Friends and Family:   . Frequency of Social Gatherings with Friends and Family:   . Attends Religious Services:   . Active Member of Clubs or Organizations:   . Attends Archivist Meetings:   Marland Kitchen Marital Status:   Intimate Partner Violence:   . Fear of Current or Ex-Partner:   . Emotionally Abused:   Marland Kitchen Physically Abused:   . Sexually Abused:     FAMILY HISTORY: Family History  Problem Relation Age of Onset  . Heart disease  Mother   . Heart attack Mother   . Arthritis Father   . Diabetes Brother     ALLERGIES:  has No Known Allergies.  MEDICATIONS:  Current Outpatient Medications  Medication Sig Dispense Refill  . amLODipine (NORVASC) 5 MG tablet Take 1 tablet (5 mg total) by mouth daily. 90 tablet 1  . diclofenac sodium (VOLTAREN) 1 % GEL Apply 2 g topically 4 (four) times daily. 100 g 2  . polyethylene glycol (MIRALAX / GLYCOLAX) 17 g packet Take 17 g by mouth daily as needed for mild constipation. 14 each 0  . rosuvastatin (CRESTOR) 40 MG tablet Take 1 tablet (40 mg total) by mouth daily. 90 tablet 1  . docusate sodium (COLACE) 100 MG capsule Take 1 capsule (100 mg total) by mouth daily. 30 capsule 1  . lidocaine-prilocaine (EMLA) cream Apply to affected area once (Patient not taking: Reported on 01/19/2020) 30 g 3  . omeprazole (PRILOSEC) 20 MG capsule omeprazole 20 mg capsule,delayed release  TAKE 1 CAPSULE BY MOUTH TWICE DAILY FOR 14 DAYS (Patient not  taking: Reported on 01/19/2020)    . senna-docusate (SENOKOT-S) 8.6-50 MG tablet Take 1 tablet by mouth 2 (two) times daily. OK to hold or decrease to 1x per day if diarrhea (Patient not taking: Reported on 12/29/2019) 30 tablet 3   No current facility-administered medications for this visit.   Facility-Administered Medications Ordered in Other Visits  Medication Dose Route Frequency Provider Last Rate Last Admin  . 0.9 %  sodium chloride infusion   Intravenous Once Rickard Patience, MD      . heparin lock flush 100 unit/mL  500 Units Intracatheter Once PRN Rickard Patience, MD      . pembrolizumab [facility]) 200 mg in sodium chloride 0.9 % 50 mL chemo infusion  200 mg Intravenous Once Rickard Patience, MD         PHYSICAL EXAMINATION: ECOG PERFORMANCE STATUS: 1 - Symptomatic but completely ambulatory Vitals:   01/19/20 1347  BP: 133/71  Pulse: 88  Resp: 20  Temp: 97.8 F (36.6 C)  SpO2: 95%   Filed Weights   01/19/20 1347  Weight: 203 lb 4.8 oz (92.2 kg)     Physical Exam Constitutional:      General: She is not in acute distress.    Comments: Walk independently  HENT:     Head: Normocephalic and atraumatic.  Eyes:     General: No scleral icterus.    Pupils: Pupils are equal, round, and reactive to light.  Cardiovascular:     Rate and Rhythm: Normal rate and regular rhythm.     Heart sounds: Normal heart sounds.  Pulmonary:     Effort: Pulmonary effort is normal. No respiratory distress.     Breath sounds: No wheezing.  Abdominal:     General: Bowel sounds are normal. There is no distension.     Palpations: Abdomen is soft. There is no mass.  Musculoskeletal:        General: No deformity. Normal range of motion.     Cervical back: Normal range of motion and neck supple.  Skin:    General: Skin is warm and dry.     Coloration: Skin is not pale.     Findings: No erythema or rash.     Comments: Ureterostomy, with light yellow color urine in the ostomy bag.  Neurological:     Mental Status: She is alert and oriented to person, place, and time. Mental status is at baseline.     Cranial Nerves: No cranial nerve deficit.     Coordination: Coordination normal.  Psychiatric:        Mood and Affect: Mood normal.     RADIOGRAPHIC STUDIES: I have personally reviewed the radiological images as listed and agreed with the findings in the report.  CMP Latest Ref Rng & Units 01/19/2020  Glucose 70 - 99 mg/dL 250(M)  BUN 8 - 23 mg/dL 71(X)  Creatinine 9.41 - 1.00 mg/dL 2.90(Y)  Sodium 753 - 391 mmol/L 141  Potassium 3.5 - 5.1 mmol/L 4.2  Chloride 98 - 111 mmol/L 109  CO2 22 - 32 mmol/L 21(L)  Calcium 8.9 - 10.3 mg/dL 9.3  Total Protein 6.5 - 8.1 g/dL 7.9(E)  Total Bilirubin 0.3 - 1.2 mg/dL 0.5  Alkaline Phos 38 - 126 U/L 93  AST 15 - 41 U/L 19  ALT 0 - 44 U/L 11   CBC Latest Ref Rng & Units 01/19/2020  WBC 4.0 - 10.5 K/uL 5.4  Hemoglobin 12.0 - 15.0 g/dL 10.0(L)  Hematocrit 36 - 46 % 31.7(L)  Platelets 150 - 400 K/uL 218     LABORATORY DATA:  I have reviewed the data as listed Lab Results  Component Value Date   WBC 5.4 01/19/2020   HGB 10.0 (L) 01/19/2020   HCT 31.7 (L) 01/19/2020   MCV 77.5 (L) 01/19/2020   PLT 218 01/19/2020   Recent Labs    11/17/19 0936 12/29/19 0935 01/19/20 1330  NA 140 141 141  K 4.6 4.4 4.2  CL 107 107 109  CO2 23 24 21*  GLUCOSE 143* 168* 155*  BUN 37* 29* 29*  CREATININE 1.83* 1.60* 1.53*  CALCIUM 9.0 9.2 9.3  GFRNONAA 27* 32* 34*  GFRAA 32* 37* 40*  PROT 7.9 7.9 8.4*  ALBUMIN 4.1 4.3 4.5  AST '19 20 19  '$ ALT '12 12 11  '$ ALKPHOS 99 99 93  BILITOT 0.5 0.6 0.5   Iron/TIBC/Ferritin/ %Sat    Component Value Date/Time   IRON 73 11/17/2019 0936   IRON 26 (L) 02/04/2019 1309   TIBC 316 11/17/2019 0936   TIBC 251 02/04/2019 1309   FERRITIN 256 11/17/2019 0936   FERRITIN 409 (H) 02/04/2019 1309   IRONPCTSAT 23 11/17/2019 0936   IRONPCTSAT 10 (L) 02/04/2019 1309    RADIOGRAPHIC STUDIES: I have personally reviewed the radiological images as listed and agreed with the findings in the report. CT Abdomen Pelvis Wo Contrast  Result Date: 01/06/2020 CLINICAL DATA:  Restaging metastatic bladder cancer. History of cystectomy and neobladder surgery in April 2020. EXAM: CT CHEST, ABDOMEN AND PELVIS WITHOUT CONTRAST TECHNIQUE: Multidetector CT imaging of the chest, abdomen and pelvis was performed following the standard protocol without IV contrast. COMPARISON:  PET-CT 09/01/2019 FINDINGS: CT CHEST FINDINGS Cardiovascular: The heart is within normal limits in size and stable. No pericardial effusion. Stable aortic calcifications but no aneurysm. Stable three-vessel coronary artery calcifications. Mediastinum/Nodes: Small scattered mediastinal and hilar lymph nodes are stable. No mass or overt adenopathy the. The esophagus is grossly normal. The thyroid gland is unremarkable. Lungs/Pleura: Stable emphysematous changes and areas of pulmonary scarring. No worrisome pulmonary nodules  to suggest pulmonary metastatic disease. No pleural effusions or pleural lesions. Stable pericardial cyst near the right inferior pulmonary vein. Musculoskeletal: No significant bony findings. CT ABDOMEN PELVIS FINDINGS Hepatobiliary: No hepatic lesions are identified without contrast. The gallbladder is unremarkable. No common bile duct dilatation. Pancreas: No mass, inflammation or ductal dilatation. Spleen: Normal size.  No focal lesions. Adrenals/Urinary Tract: The adrenal glands and kidneys are unremarkable and stable. No worrisome renal lesions or hydronephrosis. Stable surgical changes from a urinary diversion procedure with an ileal conduit. No complicating features are identified. Status post cystectomy. Stomach/Bowel: The stomach, duodenum, small bowel and colon are unremarkable. No acute inflammatory changes, mass lesions or obstructive findings. The terminal ileum is normal. The appendix is normal. Vascular/Lymphatic: Stable advanced atherosclerotic calcifications involving the aorta and branch vessels. Right iliac artery stent is noted. No mesenteric, retroperitoneal or pelvic lymphadenopathy. A few small stable scattered lobe mesenteric lymph nodes are noted. The 6 mm nodule on image 81/2 is unchanged. Reproductive: Surgically absent. Other: Small amount of free pelvic fluid is noted. No pelvic or inguinal adenopathy. Musculoskeletal: No significant bony findings. Moderate lower lumbar facet disease, SI joint degenerative changes and hip joint degenerative changes. IMPRESSION: 1. Stable surgical changes from a urinary diversion procedure with an ileal conduit. No complicating features are identified. 2. No findings for metastatic disease involving the chest, abdomen or pelvis. 3. Stable emphysematous changes and pulmonary scarring. 4. Stable advanced  atherosclerotic calcifications involving the thoracic and abdominal aorta and branch vessels including the coronary arteries. 5. Emphysema and aortic  atherosclerosis. Aortic Atherosclerosis (ICD10-I70.0) and Emphysema (ICD10-J43.9). Electronically Signed   By: Marijo Sanes M.D.   On: 01/06/2020 11:48   CT Chest Wo Contrast  Result Date: 01/06/2020 CLINICAL DATA:  Restaging metastatic bladder cancer. History of cystectomy and neobladder surgery in April 2020. EXAM: CT CHEST, ABDOMEN AND PELVIS WITHOUT CONTRAST TECHNIQUE: Multidetector CT imaging of the chest, abdomen and pelvis was performed following the standard protocol without IV contrast. COMPARISON:  PET-CT 09/01/2019 FINDINGS: CT CHEST FINDINGS Cardiovascular: The heart is within normal limits in size and stable. No pericardial effusion. Stable aortic calcifications but no aneurysm. Stable three-vessel coronary artery calcifications. Mediastinum/Nodes: Small scattered mediastinal and hilar lymph nodes are stable. No mass or overt adenopathy the. The esophagus is grossly normal. The thyroid gland is unremarkable. Lungs/Pleura: Stable emphysematous changes and areas of pulmonary scarring. No worrisome pulmonary nodules to suggest pulmonary metastatic disease. No pleural effusions or pleural lesions. Stable pericardial cyst near the right inferior pulmonary vein. Musculoskeletal: No significant bony findings. CT ABDOMEN PELVIS FINDINGS Hepatobiliary: No hepatic lesions are identified without contrast. The gallbladder is unremarkable. No common bile duct dilatation. Pancreas: No mass, inflammation or ductal dilatation. Spleen: Normal size.  No focal lesions. Adrenals/Urinary Tract: The adrenal glands and kidneys are unremarkable and stable. No worrisome renal lesions or hydronephrosis. Stable surgical changes from a urinary diversion procedure with an ileal conduit. No complicating features are identified. Status post cystectomy. Stomach/Bowel: The stomach, duodenum, small bowel and colon are unremarkable. No acute inflammatory changes, mass lesions or obstructive findings. The terminal ileum is normal. The  appendix is normal. Vascular/Lymphatic: Stable advanced atherosclerotic calcifications involving the aorta and branch vessels. Right iliac artery stent is noted. No mesenteric, retroperitoneal or pelvic lymphadenopathy. A few small stable scattered lobe mesenteric lymph nodes are noted. The 6 mm nodule on image 81/2 is unchanged. Reproductive: Surgically absent. Other: Small amount of free pelvic fluid is noted. No pelvic or inguinal adenopathy. Musculoskeletal: No significant bony findings. Moderate lower lumbar facet disease, SI joint degenerative changes and hip joint degenerative changes. IMPRESSION: 1. Stable surgical changes from a urinary diversion procedure with an ileal conduit. No complicating features are identified. 2. No findings for metastatic disease involving the chest, abdomen or pelvis. 3. Stable emphysematous changes and pulmonary scarring. 4. Stable advanced atherosclerotic calcifications involving the thoracic and abdominal aorta and branch vessels including the coronary arteries. 5. Emphysema and aortic atherosclerosis. Aortic Atherosclerosis (ICD10-I70.0) and Emphysema (ICD10-J43.9). Electronically Signed   By: Marijo Sanes M.D.   On: 01/06/2020 11:48   VAS [place] IVC/ILIAC (VENOUS ONLY)  Result Date: 11/04/2019 IVC/ILIAC STUDY Limitations: Air/bowel gas, obesity and Ostomy bag.  Performing Technologist: Charlane Ferretti RT (R)(VS)  Examination Guidelines: A complete evaluation includes B-mode imaging, spectral Doppler, color Doppler, and power Doppler as needed of all accessible portions of each vessel. Bilateral testing is considered an integral part of a complete examination. Limited examinations for reoccurring indications may be performed as noted.  IVC/Iliac Findings: +----------+------+--------+--------+    IVC    PatentThrombusComments +----------+------+--------+--------+ IVC Prox  patent                 +----------+------+--------+--------+ IVC Mid   patent                  +----------+------+--------+--------+ IVC Distalpatent                 +----------+------+--------+--------+  +-----------------+---------+-----------+---------+-----------+--------+  CIV       RT-PatentRT-ThrombusLT-PatentLT-ThrombusComments +-----------------+---------+-----------+---------+-----------+--------+ Common Iliac Prox patent              patent                      +-----------------+---------+-----------+---------+-----------+--------+ Common Iliac Mid  patent              patent                      +-----------------+---------+-----------+---------+-----------+--------+  Summary: IVC/Iliac: No evidence of abdnormal dilitation was noted in the Inferior Vena Cava and common Iliac artery. There is no evidence of thrombus involving the right common iliac vein and left common iliac vein. Patent right iliac vein stent.  *See table(s) above for measurements and observations.  Electronically signed by Festus Barren MD on 11/04/2019 at 12:46:40 PM.   Final    VAS [place] ABI WITH/WO TBI  Result Date: 11/04/2019 LOWER EXTREMITY DOPPLER STUDY Indications: Rest pain, and peripheral artery disease.  Performing Technologist: Reece Agar RT (R)(VS)  Examination Guidelines: A complete evaluation includes at minimum, Doppler waveform signals and systolic blood pressure reading at the level of bilateral brachial, anterior tibial, and posterior tibial arteries, when vessel segments are accessible. Bilateral testing is considered an integral part of a complete examination. Photoelectric Plethysmograph (PPG) waveforms and toe systolic pressure readings are included as required and additional duplex testing as needed. Limited examinations for reoccurring indications may be performed as noted.  ABI Findings: +---------+------------------+-----+---------+--------+ Right    Rt Pressure (mmHg)IndexWaveform Comment  +---------+------------------+-----+---------+--------+ Brachial 164                                       +---------+------------------+-----+---------+--------+ ATA      196               1.18 triphasic         +---------+------------------+-----+---------+--------+ PTA      190               1.14 triphasic         +---------+------------------+-----+---------+--------+ Great Toe195               1.17 Normal            +---------+------------------+-----+---------+--------+ +---------+------------------+-----+---------+-------+ Left     Lt Pressure (mmHg)IndexWaveform Comment +---------+------------------+-----+---------+-------+ Brachial 166                                     +---------+------------------+-----+---------+-------+ ATA      192               1.16 triphasic        +---------+------------------+-----+---------+-------+ PTA      192               1.16 triphasic        +---------+------------------+-----+---------+-------+ Great Toe249               1.50 Normal           +---------+------------------+-----+---------+-------+ Summary: Right: Resting right ankle-brachial index is within normal range. No evidence of significant right lower extremity arterial disease. The right toe-brachial index is normal. Left: Resting left ankle-brachial index is within normal range. No evidence of significant left lower extremity arterial disease. The left toe-brachial index is normal. *See table(s) above  for measurements and observations.  Electronically signed by Leotis Pain MD on 11/04/2019 at 12:46:35 PM.   Final      ASSESSMENT & PLAN:  1. Metastatic urothelial carcinoma ([place])   2. Anemia in stage 3a chronic kidney disease   3. Encounter for antineoplastic immunotherapy   4. Constipation, unspecified constipation type   Cancer Staging Bladder carcinoma [facility]) Staging form: Urinary Bladder, AJCC 8th Edition - Clinical stage from 08/01/2018: Stage IVA (cTX, cN3, cM1a) - Signed by [name], MD on 04/21/2019  #Metastatic  urothelial carcinoma of bladder, sarcomatoid features pelvic sidewall mass showed metastatic carcinoma consistent with involvement by urothelial carcinoma. Patient has been on Keytruda every 3 weeks  CT images were independantly reviewed and discussed with patient.  She remains in remission.  Continue Keytruda every 3 weeks  #CKD, creatinine level is at her baseline. #Anemia secondary to chronic kidney disease, hemoglobin has been stable. She declined IV iron treatments.  Confirmed Alpha thalassemia trait which explains her chronic microcytosis.   #Right lower extremity  femoral vein DVT provoked by transvenous biopsy in the context of cancer recurrence, Status post IV C filter and mechanical thrombectomy.  IVC filter has been removed. She is currently off anticoagulation due to hematuria.  #Abdominal pain, unknown etiology. ? Constipation. Advise patient to try colace '100mg'$  daily.   All questions were answered. The patient knows to call the clinic with any problems questions or concerns.  Return of visit: 3 weeks.    [name], MD, PhD Hematology Oncology [facility] Pager- [phone] 01/19/20

## 2020-02-09 ENCOUNTER — Inpatient Hospital Stay (HOSPITAL_BASED_OUTPATIENT_CLINIC_OR_DEPARTMENT_OTHER): Payer: Medicare Other | Admitting: Oncology

## 2020-02-09 ENCOUNTER — Other Ambulatory Visit: Payer: Self-pay

## 2020-02-09 ENCOUNTER — Encounter: Payer: Self-pay | Admitting: Oncology

## 2020-02-09 ENCOUNTER — Inpatient Hospital Stay: Payer: Medicare Other | Attending: Oncology

## 2020-02-09 ENCOUNTER — Inpatient Hospital Stay: Payer: Medicare Other

## 2020-02-09 VITALS — BP 125/74 | HR 102 | Temp 97.7°F | Resp 18 | Wt 202.6 lb

## 2020-02-09 DIAGNOSIS — C7910 Secondary malignant neoplasm of unspecified urinary organs: Secondary | ICD-10-CM | POA: Diagnosis not present

## 2020-02-09 DIAGNOSIS — D631 Anemia in chronic kidney disease: Secondary | ICD-10-CM

## 2020-02-09 DIAGNOSIS — M7989 Other specified soft tissue disorders: Secondary | ICD-10-CM | POA: Insufficient documentation

## 2020-02-09 DIAGNOSIS — Z79899 Other long term (current) drug therapy: Secondary | ICD-10-CM | POA: Diagnosis not present

## 2020-02-09 DIAGNOSIS — I700 Atherosclerosis of aorta: Secondary | ICD-10-CM | POA: Diagnosis not present

## 2020-02-09 DIAGNOSIS — D563 Thalassemia minor: Secondary | ICD-10-CM | POA: Insufficient documentation

## 2020-02-09 DIAGNOSIS — Z86718 Personal history of other venous thrombosis and embolism: Secondary | ICD-10-CM | POA: Insufficient documentation

## 2020-02-09 DIAGNOSIS — Z5112 Encounter for antineoplastic immunotherapy: Secondary | ICD-10-CM | POA: Diagnosis not present

## 2020-02-09 DIAGNOSIS — E1165 Type 2 diabetes mellitus with hyperglycemia: Secondary | ICD-10-CM | POA: Diagnosis not present

## 2020-02-09 DIAGNOSIS — N179 Acute kidney failure, unspecified: Secondary | ICD-10-CM | POA: Insufficient documentation

## 2020-02-09 DIAGNOSIS — N183 Chronic kidney disease, stage 3 unspecified: Secondary | ICD-10-CM | POA: Diagnosis not present

## 2020-02-09 DIAGNOSIS — J984 Other disorders of lung: Secondary | ICD-10-CM | POA: Diagnosis not present

## 2020-02-09 DIAGNOSIS — Z7982 Long term (current) use of aspirin: Secondary | ICD-10-CM | POA: Insufficient documentation

## 2020-02-09 DIAGNOSIS — R944 Abnormal results of kidney function studies: Secondary | ICD-10-CM | POA: Insufficient documentation

## 2020-02-09 DIAGNOSIS — Z87891 Personal history of nicotine dependence: Secondary | ICD-10-CM | POA: Diagnosis not present

## 2020-02-09 DIAGNOSIS — C679 Malignant neoplasm of bladder, unspecified: Secondary | ICD-10-CM | POA: Insufficient documentation

## 2020-02-09 DIAGNOSIS — E785 Hyperlipidemia, unspecified: Secondary | ICD-10-CM | POA: Diagnosis not present

## 2020-02-09 DIAGNOSIS — N1831 Chronic kidney disease, stage 3a: Secondary | ICD-10-CM

## 2020-02-09 DIAGNOSIS — R319 Hematuria, unspecified: Secondary | ICD-10-CM | POA: Insufficient documentation

## 2020-02-09 DIAGNOSIS — M810 Age-related osteoporosis without current pathological fracture: Secondary | ICD-10-CM | POA: Diagnosis not present

## 2020-02-09 DIAGNOSIS — I129 Hypertensive chronic kidney disease with stage 1 through stage 4 chronic kidney disease, or unspecified chronic kidney disease: Secondary | ICD-10-CM | POA: Insufficient documentation

## 2020-02-09 LAB — COMPREHENSIVE METABOLIC PANEL
ALT: 14 U/L (ref 0–44)
AST: 27 U/L (ref 15–41)
Albumin: 4.3 g/dL (ref 3.5–5.0)
Alkaline Phosphatase: 100 U/L (ref 38–126)
Anion gap: 13 (ref 5–15)
BUN: 41 mg/dL — ABNORMAL HIGH (ref 8–23)
CO2: 21 mmol/L — ABNORMAL LOW (ref 22–32)
Calcium: 9 mg/dL (ref 8.9–10.3)
Chloride: 104 mmol/L (ref 98–111)
Creatinine, Ser: 2.35 mg/dL — ABNORMAL HIGH (ref 0.44–1.00)
GFR calc Af Amer: 24 mL/min — ABNORMAL LOW (ref >60)
GFR calc non Af Amer: 20 mL/min — ABNORMAL LOW (ref >60)
Glucose, Bld: 228 mg/dL — ABNORMAL HIGH (ref 70–99)
Potassium: 4.4 mmol/L (ref 3.5–5.1)
Sodium: 138 mmol/L (ref 135–145)
Total Bilirubin: 0.6 mg/dL (ref 0.3–1.2)
Total Protein: 7.9 g/dL (ref 6.5–8.1)

## 2020-02-09 LAB — CBC WITH DIFFERENTIAL/PLATELET
Abs Immature Granulocytes: 0.02 10*3/uL (ref 0.00–0.07)
Basophils Absolute: 0 10*3/uL (ref 0.0–0.1)
Basophils Relative: 0 %
Eosinophils Absolute: 0.3 10*3/uL (ref 0.0–0.5)
Eosinophils Relative: 3 %
HCT: 30.8 % — ABNORMAL LOW (ref 36.0–46.0)
Hemoglobin: 9.8 g/dL — ABNORMAL LOW (ref 12.0–15.0)
Immature Granulocytes: 0 %
Lymphocytes Relative: 17 %
Lymphs Abs: 1.3 10*3/uL (ref 0.7–4.0)
MCH: 24.3 pg — ABNORMAL LOW (ref 26.0–34.0)
MCHC: 31.8 g/dL (ref 30.0–36.0)
MCV: 76.4 fL — ABNORMAL LOW (ref 80.0–100.0)
Monocytes Absolute: 0.7 10*3/uL (ref 0.1–1.0)
Monocytes Relative: 8 %
Neutro Abs: 5.6 10*3/uL (ref 1.7–7.7)
Neutrophils Relative %: 72 %
Platelets: 216 10*3/uL (ref 150–400)
RBC: 4.03 MIL/uL (ref 3.87–5.11)
RDW: 13.3 % (ref 11.5–15.5)
WBC: 7.9 10*3/uL (ref 4.0–10.5)
nRBC: 0 % (ref 0.0–0.2)

## 2020-02-09 LAB — TSH: TSH: 1.278 u[IU]/mL (ref 0.350–4.500)

## 2020-02-09 MED ORDER — SODIUM CHLORIDE 0.9% FLUSH
10.0000 mL | INTRAVENOUS | Status: DC | PRN
  Administered 2020-02-09: 10 mL via INTRAVENOUS
  Filled 2020-02-09: qty 10

## 2020-02-09 MED ORDER — SODIUM CHLORIDE 0.9 % IV SOLN
Freq: Once | INTRAVENOUS | Status: AC
  Filled 2020-02-09: qty 250

## 2020-02-09 MED ORDER — HEPARIN SOD (PORK) LOCK FLUSH 100 UNIT/ML IV SOLN
INTRAVENOUS | Status: AC
  Filled 2020-02-09: qty 5

## 2020-02-09 MED ORDER — HEPARIN SOD (PORK) LOCK FLUSH 100 UNIT/ML IV SOLN
500.0000 [IU] | Freq: Once | INTRAVENOUS | Status: AC
  Administered 2020-02-09: 500 [IU] via INTRAVENOUS
  Filled 2020-02-09: qty 5

## 2020-02-09 NOTE — Progress Notes (Signed)
Hematology/Oncology follow up note [facility] [phone] [phone]   Patient Care Team: Lane, Rachel Elizabeth, PA-C as PCP - General (Family Medicine) Nice, Keith B, OD (Optometry) Cledith Abdou, MD as Consulting Physician (Hematology and Oncology)  REFERRING PROVIDER: Dr.Sninsky CHIEF COMPLAINTS/REASON FOR VISIT:  Follow up for bladder cancer, anemia.   HISTORY OF PRESENTING ILLNESS:  Angela Russell is a  69 y.o.  female with PMH listed below who was referred to me for evaluation of newly diagnosed bladder cancer. Patient initially presented to emergency room at the end of January 2020 for evaluation of dysuria, hematuria and the left lower quadrant inguinal pain and flank pain.  1/28 2020 CT renal stone study showed suspected irregular wall thickening about the superior bladder, not well assessed due to degree of bladder distention.  Recommend cystoscopy for further evaluation.  No renal stone or obstructive uropathy.  Patient was given IV Rocephin and referred patient for outpatient urology follow-up.  Urine culture was negative.  She again presented to ER after 2 days with similar symptoms.  Patient has 25-pack-year smoking history, quit approximately 20 years ago.  No family history of any urology malignancies 07/03/2018 another CT abdomen pelvis with contrast was done which showed no nephrolithiasis or hydronephrosis is identified.  Bladder is decompressed limiting evaluation.  07/16/2018.urology Dr. Sinskey - cystoscopy and bilateral retrograde pyelogram on 07/16/2018.  Pyelogram did not show any filling defect or abnormalities.  No hydronephrosis.  Ureteral orifice was not involved with tumor.  There is a large 5 cm posterior wall bladder tumor, bullous and sessile appearing.  Patient underwent TURBT.   Pathology: High-grade urothelial carcinoma, invasive into muscularis propria.  Lymphovascular invasion is present.  Carcinoma in situ is also  identified.  Focal squamous differentiation is noted, areas of invasive carcinoma display pleomorphic/sarcomatoid changes.  T2b  08/07/2018 CT without contrast negative.  2 subpleural right upper lobe nodule 2 to 3 mm likely benign. She also had baseline audiometry done.  08/04/2018 ddMVAC x 1 cycle, stopped due to intolerance and AKI.  Patient received 1 cycle of dd MVAC, not able to tolerate due to AKI. Patient then was referred to [facility]  09/22/2018 patient underwent a cystectomy, pathology pT3a N0 Mx.  11 lymph nodes were harvested and was all negative. Invasive urothelia carcinoma, high grade, with sarcomatoid features.   10/14/2018 patient was admitted due to pyelonephritis and a pelvic fluid collection.  Drain was placed. 10/23/2018 drain was removed. Patient has had difficulties getting to her appointments to [facility]. 02/09/2019, patient presented with abdominal pain. CT concerning for small bowel obstruction and increased size of right pelvic fluid collection concerning for cancer recurrence.  Right hydronephrosis to the level of pelvis and enlarged lymph nodes. Patient had JP drain placed with CT guidance to pelvic fluid collection and drained 400 cc amber fluid.  Culture was negative for growth of microorganisms and cytology was negative for malignancy.-JP drain was removed on the day of discharge. CT-guided core biopsy of pelvic lymph node adenopathy was attempted but not successful.  # Right lower extremity DVT, provoked by Transvenous biopsy  02/26/2019 transvenous biopsy by IR unsuccessful transvenous biopsy of the right pelvis mass. Post biopsy acute thrombus in the right external iliac and common femoral vein.  Patient was recommended to start anticoagulation with Xarelto however due to the co-pay, patient is not able to afford the medication. Anticoagulation regimen was switched to Eliquis 5 mg.  Patient reports that she has been taking   it once a day. 03/20/2019 PET showed  FDG avid tissue in the cystectomy bed, retroperitoneal and pelvic lymphadenopathy.  Right common iliac DVT.  # increased right lower extremity swelling.  She was started on Eliquis for anticoagulation by [facility].  We clarified with her pharmacy and she was actually taking Eliquis 2.5 mg twice daily. Right lower extremity swelling has not improved but instead worsened. 03/16/2019 She had ultrasound right lower extremity done which showed persistent extensive proximal right lower extremity DVT.  Anticoagulation regimen has increased to Eliquis 5 mg twice daily.  # establish care with [facility] oncology Dr. Romeo Apple for evaluation.  Dr. Romeo Apple recommended starting immunotherapy with PD-L1 inhibitor Pembrolizumab 200 mg every 3 weeks.  The sarcomatoid histology may not respond to chemotherapy well, could portend a better chance of response into immunotherapy PET scan after 4 cycles of Keytruda showed single mildly enlarged central mesenteric lymph node in the upper pelvis with SUV 9.6.  No other abnormal hypermetabolic activity was reported.  # 03/30/2019 Status post IVC filter placement, mechanical thrombectomy.-IVC filter was retrieved in January 2021. # PD-L1 CPS 100%.  # 03/31/2019 started on immunotherapy Keytruda.   INTERVAL HISTORY Angela Russell is a 69 y.o. female who has above history reviewed by me today presents for follow up visit for evaluation form metastatic high-grade urothelial carcinoma of the bladder. Sarcomatoid feature.  Patient has failed immunotherapy.  She continues to have intermittent discomfort although today she reports the discomfort is not " as bad".  No nausea vomiting or diarrhea.  Review of Systems  Constitutional: Negative for appetite change, chills, fatigue and fever.  HENT:   Negative for hearing loss and voice change.   Eyes: Negative for eye problems.  Respiratory: Negative for chest tightness and cough.   Cardiovascular: Negative for chest pain and leg  swelling.  Gastrointestinal: Negative for abdominal distention, abdominal pain and blood in stool.       Abdominal discomfort.   Endocrine: Negative for hot flashes.  Genitourinary: Negative for difficulty urinating and frequency.   Musculoskeletal: Negative for arthralgias.  Skin: Negative for itching and rash.  Neurological: Positive for numbness. Negative for extremity weakness.  Hematological: Negative for adenopathy.  Psychiatric/Behavioral: Negative for confusion. The patient is not nervous/anxious.     MEDICAL HISTORY:  Past Medical History:  Diagnosis Date  . Carotid artery plaque, right 01/2014  . CKD (chronic kidney disease)    stage 2-3  . Diabetes mellitus without complication (HCC)   . DM (diabetes mellitus), type 2, uncontrolled (HCC)   . Hyperlipidemia   . Hypertension   . Hypochromic microcytic anemia    mild  . Metastatic urothelial carcinoma (HCC) 03/23/2019  . Osteoporosis   . Renal insufficiency   . Rotator cuff tendonitis, right     SURGICAL HISTORY: Past Surgical History:  Procedure Laterality Date  . ABDOMINAL HYSTERECTOMY  2000   due to bleeding and fibroids, partial- still has ovaries  . CYSTOSCOPY W/ RETROGRADES Bilateral 07/16/2018   Procedure: CYSTOSCOPY WITH RETROGRADE PYELOGRAM;  Surgeon: Sondra Come, MD;  Location: [facility] ORS;  Service: Urology;  Laterality: Bilateral;  . IVC FILTER INSERTION N/A 03/30/2019   Procedure: IVC FILTER INSERTION;  Surgeon: Annice Needy, MD;  Location: [facility] IN[facility]ASIVE CV LAB;  Service: Cardiovascular;  Laterality: N/A;  . IVC FILTER REMOVAL N/A 06/15/2019   Procedure: IVC FILTER REMOVAL;  Surgeon: Annice Needy, MD;  Location: [facility] INVASIVE CV [facility];  Service: Cardiovascular;  Laterality: N/A;  . KNEE SURGERY  Left 03/17/2013   torn meniscus  . PERIPHERAL VASCULAR THROMBECTOMY Right 03/30/2019   Procedure: PERIPHERAL VASCULAR THROMBECTOMY;  Surgeon: Algernon Huxley, MD;  Location: [place] CV [facility];  Service:  Cardiovascular;  Laterality: Right;  . PORTA CATH INSERTION N/A 08/06/2018   Procedure: PORTA CATH INSERTION;  Surgeon: Algernon Huxley, MD;  Location: [place] CV [facility];  Service: Cardiovascular;  Laterality: N/A;  . TRANSURETHRAL RESECTION OF BLADDER TUMOR N/A 07/16/2018   Procedure: TRANSURETHRAL RESECTION OF BLADDER TUMOR (TURBT);  Surgeon: Billey Co, MD;  Location: [facility] OR[facility];  Service: Urology;  Laterality: N/A;    SOCIAL HISTORY: Social History   Socioeconomic History  . Marital status: Single    Spouse name: Not on file  . Number of children: 1  . Years of education: Not on file  . Highest education level: 10th grade  Occupational History  . Not on file  Tobacco Use  . Smoking status: Former Smoker    Quit date: 06/05/1991    Years since quitting: 28.7  . Smokeless tobacco: Never Used  Vaping Use  . Vaping Use: Never used  Substance and Sexual Activity  . Alcohol use: No  . Drug use: No  . Sexual activity: Never  Other Topics Concern  . Not on file  Social History Narrative   Working full time   Social Determinants of Radio broadcast assistant Strain:   . Difficulty of Paying Living Expenses: Not on file  Food Insecurity:   . Worried About Charity fundraiser in the Last Year: Not on file  . Ran Out of Food in the Last Year: Not on file  Transportation Needs:   . Lack of Transportation (Medical): Not on file  . Lack of Transportation (Non-Medical): Not on file  Physical Activity:   . Days of Exercise per Week: Not on file  . Minutes of Exercise per Session: Not on file  Stress:   . Feeling of Stress : Not on file  Social Connections:   . Frequency of Communication with Friends and Family: Not on file  . Frequency of Social Gatherings with Friends and Family: Not on file  . Attends Religious Services: Not on file  . Active Member of Clubs or Organizations: Not on file  . Attends Archivist Meetings: Not on file  . Marital Status: Not on file    Intimate Partner Violence:   . Fear of Current or Ex-Partner: Not on file  . Emotionally Abused: Not on file  . Physically Abused: Not on file  . Sexually Abused: Not on file    FAMILY HISTORY: Family History  Problem Relation Age of Onset  . Heart disease Mother   . Heart attack Mother   . Arthritis Father   . Diabetes Brother     ALLERGIES:  has No Known Allergies.  MEDICATIONS:  Current Outpatient Medications  Medication Sig Dispense Refill  . amLODipine (NORVASC) 5 MG tablet Take 1 tablet (5 mg total) by mouth daily. 90 tablet 1  . diclofenac sodium (VOLTAREN) 1 % GEL Apply 2 g topically 4 (four) times daily. 100 g 2  . docusate sodium (COLACE) 100 MG capsule Take 1 capsule (100 mg total) by mouth daily. 30 capsule 1  . polyethylene glycol (MIRALAX / GLYCOLAX) 17 g packet Take 17 g by mouth daily as needed for mild constipation. 14 each 0  . rosuvastatin (CRESTOR) 40 MG tablet Take 1 tablet (40 mg total) by mouth daily. [place]  tablet 1  . lidocaine-prilocaine (EMLA) cream Apply to affected area once (Patient not taking: Reported on 01/19/2020) 30 g 3  . omeprazole (PRILOSEC) 20 MG capsule omeprazole 20 mg capsule,delayed release  TAKE 1 CAPSULE BY MOUTH TWICE DAILY FOR 14 DAYS (Patient not taking: Reported on 01/19/2020)    . senna-docusate (SENOKOT-S) 8.6-50 MG tablet Take 1 tablet by mouth 2 (two) times daily. OK to hold or decrease to 1x per day if diarrhea (Patient not taking: Reported on 12/29/2019) 30 tablet 3   No current facility-administered medications for this visit.   Facility-Administered Medications Ordered in Other Visits  Medication Dose Route Frequency Provider Last Rate Last Admin  . 0.9 %  sodium chloride infusion   Intravenous Once [name], MD 999 mL/hr at 02/09/20 0936 New Bag at 02/09/20 0936  . heparin lock flush 100 unit/mL  500 Units Intravenous Once [name], MD      . sodium chloride flush (NS) 0.9 % injection 10 mL  10 mL Intravenous PRN [name], MD    10 mL at 02/09/20 0841     PHYSICAL EXAMINATION: ECOG PERFORMANCE STATUS: 1 - Symptomatic but completely ambulatory Vitals:   02/09/20 0857  BP: 125/74  Pulse: (!) 102  Resp: 18  Temp: 97.7 F (36.5 C)   Filed Weights   02/09/20 0857  Weight: 202 lb 9.6 oz (91.9 kg)    Physical Exam Constitutional:      General: She is not in acute distress.    Comments: Walk independently  HENT:     Head: Normocephalic and atraumatic.  Eyes:     General: No scleral icterus.    Pupils: Pupils are equal, round, and reactive to light.  Cardiovascular:     Rate and Rhythm: Normal rate and regular rhythm.     Heart sounds: Normal heart sounds.  Pulmonary:     Effort: Pulmonary effort is normal. No respiratory distress.     Breath sounds: No wheezing.  Abdominal:     General: Bowel sounds are normal. There is no distension.     Palpations: Abdomen is soft. There is no mass.  Musculoskeletal:        General: No deformity. Normal range of motion.     Cervical back: Normal range of motion and neck supple.  Skin:    General: Skin is warm and dry.     Coloration: Skin is not pale.     Findings: No erythema or rash.     Comments: Ureterostomy, with light yellow color urine in the ostomy bag.  Neurological:     Mental Status: She is alert and oriented to person, place, and time. Mental status is at baseline.     Cranial Nerves: No cranial nerve deficit.     Coordination: Coordination normal.  Psychiatric:        Mood and Affect: Mood normal.     RADIOGRAPHIC STUDIES: I have personally reviewed the radiological images as listed and agreed with the findings in the report.  CMP Latest Ref Rng & Units 02/09/2020  Glucose 70 - 99 mg/dL 228(H)  BUN 8 - 23 mg/dL 41(H)  Creatinine 0.44 - 1.00 mg/dL 2.35(H)  Sodium 135 - 145 mmol/L 138  Potassium 3.5 - 5.1 mmol/L 4.4  Chloride 98 - 111 mmol/L 104  CO2 22 - 32 mmol/L 21(L)  Calcium 8.9 - 10.3 mg/dL 9.0  Total Protein 6.5 - 8.1 g/dL 7.9    Total Bilirubin 0.3 - 1.2 mg/dL 0.6  Alkaline  Phos 38 - 126 U/L 100  AST 15 - 41 U/L 27  ALT 0 - 44 U/L 14   CBC Latest Ref Rng & Units 02/09/2020  WBC 4.0 - 10.5 K/uL 7.9  Hemoglobin 12.0 - 15.0 g/dL 9.8(L)  Hematocrit 36 - 46 % 30.8(L)  Platelets 150 - 400 K/uL 216    LABORATORY DATA:  I have reviewed the data as listed Lab Results  Component Value Date   WBC 7.9 02/09/2020   HGB 9.8 (L) 02/09/2020   HCT 30.8 (L) 02/09/2020   MCV 76.4 (L) 02/09/2020   PLT 216 02/09/2020   Recent Labs    12/29/19 0935 01/19/20 1330 02/09/20 0832  NA 141 141 138  K 4.4 4.2 4.4  CL 107 109 104  CO2 24 21* 21*  GLUCOSE 168* 155* 228*  BUN 29* 29* 41*  CREATININE 1.60* 1.53* 2.35*  CALCIUM 9.2 9.3 9.0  GFRNONAA 32* 34* 20*  GFRAA 37* 40* 24*  PROT 7.9 8.4* 7.9  ALBUMIN 4.3 4.5 4.3  AST $Re'20 19 27  'UDx$ ALT $R'12 11 14  'oN$ ALKPHOS 99 93 100  BILITOT 0.6 0.5 0.6   Iron/TIBC/Ferritin/ %Sat    Component Value Date/Time   IRON 73 11/17/2019 0936   IRON 26 (L) 02/04/2019 1309   TIBC 316 11/17/2019 0936   TIBC 251 02/04/2019 1309   FERRITIN 256 11/17/2019 0936   FERRITIN 409 (H) 02/04/2019 1309   IRONPCTSAT 23 11/17/2019 0936   IRONPCTSAT 10 (L) 02/04/2019 1309    RADIOGRAPHIC STUDIES: I have personally reviewed the radiological images as listed and agreed with the findings in the report. CT Abdomen Pelvis Wo Contrast  Result Date: 01/06/2020 CLINICAL DATA:  Restaging metastatic bladder cancer. History of cystectomy and neobladder surgery in April 2020. EXAM: CT CHEST, ABDOMEN AND PELVIS WITHOUT CONTRAST TECHNIQUE: Multidetector CT imaging of the chest, abdomen and pelvis was performed following the standard protocol without IV contrast. COMPARISON:  PET-CT 09/01/2019 FINDINGS: CT CHEST FINDINGS Cardiovascular: The heart is within normal limits in size and stable. No pericardial effusion. Stable aortic calcifications but no aneurysm. Stable three-vessel coronary artery calcifications.  Mediastinum/Nodes: Small scattered mediastinal and hilar lymph nodes are stable. No mass or overt adenopathy the. The esophagus is grossly normal. The thyroid gland is unremarkable. Lungs/Pleura: Stable emphysematous changes and areas of pulmonary scarring. No worrisome pulmonary nodules to suggest pulmonary metastatic disease. No pleural effusions or pleural lesions. Stable pericardial cyst near the right inferior pulmonary vein. Musculoskeletal: No significant bony findings. CT ABDOMEN PELVIS FINDINGS Hepatobiliary: No hepatic lesions are identified without contrast. The gallbladder is unremarkable. No common bile duct dilatation. Pancreas: No mass, inflammation or ductal dilatation. Spleen: Normal size.  No focal lesions. Adrenals/Urinary Tract: The adrenal glands and kidneys are unremarkable and stable. No worrisome renal lesions or hydronephrosis. Stable surgical changes from a urinary diversion procedure with an ileal conduit. No complicating features are identified. Status post cystectomy. Stomach/Bowel: The stomach, duodenum, small bowel and colon are unremarkable. No acute inflammatory changes, mass lesions or obstructive findings. The terminal ileum is normal. The appendix is normal. Vascular/Lymphatic: Stable advanced atherosclerotic calcifications involving the aorta and branch vessels. Right iliac artery stent is noted. No mesenteric, retroperitoneal or pelvic lymphadenopathy. A few small stable scattered lobe mesenteric lymph nodes are noted. The 6 mm nodule on image 81/2 is unchanged. Reproductive: Surgically absent. Other: Small amount of free pelvic fluid is noted. No pelvic or inguinal adenopathy. Musculoskeletal: No significant bony findings. Moderate lower lumbar facet disease,  SI joint degenerative changes and hip joint degenerative changes. IMPRESSION: 1. Stable surgical changes from a urinary diversion procedure with an ileal conduit. No complicating features are identified. 2. No findings for  metastatic disease involving the chest, abdomen or pelvis. 3. Stable emphysematous changes and pulmonary scarring. 4. Stable advanced atherosclerotic calcifications involving the thoracic and abdominal aorta and branch vessels including the coronary arteries. 5. Emphysema and aortic atherosclerosis. Aortic Atherosclerosis (ICD10-I70.0) and Emphysema (ICD10-J43.9). Electronically Signed   By: Marijo Sanes M.D.   On: 01/06/2020 11:48   CT Chest Wo Contrast  Result Date: 01/06/2020 CLINICAL DATA:  Restaging metastatic bladder cancer. History of cystectomy and neobladder surgery in April 2020. EXAM: CT CHEST, ABDOMEN AND PELVIS WITHOUT CONTRAST TECHNIQUE: Multidetector CT imaging of the chest, abdomen and pelvis was performed following the standard protocol without IV contrast. COMPARISON:  PET-CT 09/01/2019 FINDINGS: CT CHEST FINDINGS Cardiovascular: The heart is within normal limits in size and stable. No pericardial effusion. Stable aortic calcifications but no aneurysm. Stable three-vessel coronary artery calcifications. Mediastinum/Nodes: Small scattered mediastinal and hilar lymph nodes are stable. No mass or overt adenopathy the. The esophagus is grossly normal. The thyroid gland is unremarkable. Lungs/Pleura: Stable emphysematous changes and areas of pulmonary scarring. No worrisome pulmonary nodules to suggest pulmonary metastatic disease. No pleural effusions or pleural lesions. Stable pericardial cyst near the right inferior pulmonary vein. Musculoskeletal: No significant bony findings. CT ABDOMEN PELVIS FINDINGS Hepatobiliary: No hepatic lesions are identified without contrast. The gallbladder is unremarkable. No common bile duct dilatation. Pancreas: No mass, inflammation or ductal dilatation. Spleen: Normal size.  No focal lesions. Adrenals/Urinary Tract: The adrenal glands and kidneys are unremarkable and stable. No worrisome renal lesions or hydronephrosis. Stable surgical changes from a urinary  diversion procedure with an ileal conduit. No complicating features are identified. Status post cystectomy. Stomach/Bowel: The stomach, duodenum, small bowel and colon are unremarkable. No acute inflammatory changes, mass lesions or obstructive findings. The terminal ileum is normal. The appendix is normal. Vascular/Lymphatic: Stable advanced atherosclerotic calcifications involving the aorta and branch vessels. Right iliac artery stent is noted. No mesenteric, retroperitoneal or pelvic lymphadenopathy. A few small stable scattered lobe mesenteric lymph nodes are noted. The 6 mm nodule on image 81/2 is unchanged. Reproductive: Surgically absent. Other: Small amount of free pelvic fluid is noted. No pelvic or inguinal adenopathy. Musculoskeletal: No significant bony findings. Moderate lower lumbar facet disease, SI joint degenerative changes and hip joint degenerative changes. IMPRESSION: 1. Stable surgical changes from a urinary diversion procedure with an ileal conduit. No complicating features are identified. 2. No findings for metastatic disease involving the chest, abdomen or pelvis. 3. Stable emphysematous changes and pulmonary scarring. 4. Stable advanced atherosclerotic calcifications involving the thoracic and abdominal aorta and branch vessels including the coronary arteries. 5. Emphysema and aortic atherosclerosis. Aortic Atherosclerosis (ICD10-I70.0) and Emphysema (ICD10-J43.9). Electronically Signed   By: Marijo Sanes M.D.   On: 01/06/2020 11:48     ASSESSMENT & PLAN:  1. Metastatic urothelial carcinoma ([place])   2. Anemia in stage 3a chronic kidney disease   3. AKI (acute kidney injury) [facility])   Cancer Staging Bladder carcinoma [facility]) Staging form: Urinary Bladder, AJCC 8th Edition - Clinical stage from 08/01/2018: Stage IVA (cTX, cN3, cM1a) - Signed by [name], MD on 04/21/2019  #Metastatic urothelial carcinoma of bladder, sarcomatoid features pelvic sidewall mass showed metastatic carcinoma  consistent with involvement by urothelial carcinoma. Patient has been on Keytruda every 3 weeks  Labs are reviewed and discussed  with patient.  Hold treatment due to acute deterioration of her kidney function.  #AKI on CKD, Creatinine increased to 2.35.  Possible due to dehydration.  She is mildly tachycardic today as well. Hold immunotherapy today. Patient will proceed with IV normal saline 1 L x 1 today.  Advised patient to increase oral hydration.  Repeat kidney function in 1 week.  #Anemia in stage III chronic kidney disease, hemoglobin is 9.9.  Patient has microcytosis secondary to alpha thalassemia trait. She also has functional iron deficiency for which she declined IV iron treatments.  Continue oral iron supplementation.  #Right lower extremity  femoral vein DVT provoked by transvenous biopsy in the context of cancer recurrence, Status post IV C filter and mechanical thrombectomy.  IVC filter has been removed. She is currently off anticoagulation due to hematuria.  #Follow-up in 1 week for reevaluation of resuming immunotherapy. All questions were answered. The patient knows to call the clinic with any problems questions or concerns.  [name], MD, PhD Hematology Oncology [facility] Pager- [phone] 02/09/20

## 2020-02-09 NOTE — Progress Notes (Signed)
Patient denies new problems/concerns today.   °

## 2020-02-10 ENCOUNTER — Ambulatory Visit: Payer: Self-pay | Admitting: Urology

## 2020-02-16 ENCOUNTER — Inpatient Hospital Stay: Payer: Medicare Other

## 2020-02-16 ENCOUNTER — Inpatient Hospital Stay (HOSPITAL_BASED_OUTPATIENT_CLINIC_OR_DEPARTMENT_OTHER): Payer: Medicare Other | Admitting: Oncology

## 2020-02-16 ENCOUNTER — Other Ambulatory Visit: Payer: Self-pay

## 2020-02-16 ENCOUNTER — Encounter: Payer: Self-pay | Admitting: Oncology

## 2020-02-16 VITALS — BP 146/69 | HR 103 | Temp 96.4°F | Resp 18 | Wt 202.6 lb

## 2020-02-16 DIAGNOSIS — R7989 Other specified abnormal findings of blood chemistry: Secondary | ICD-10-CM

## 2020-02-16 DIAGNOSIS — D631 Anemia in chronic kidney disease: Secondary | ICD-10-CM

## 2020-02-16 DIAGNOSIS — C679 Malignant neoplasm of bladder, unspecified: Secondary | ICD-10-CM | POA: Diagnosis not present

## 2020-02-16 DIAGNOSIS — Z5112 Encounter for antineoplastic immunotherapy: Secondary | ICD-10-CM | POA: Diagnosis not present

## 2020-02-16 DIAGNOSIS — Z95828 Presence of other vascular implants and grafts: Secondary | ICD-10-CM

## 2020-02-16 DIAGNOSIS — N183 Chronic kidney disease, stage 3 unspecified: Secondary | ICD-10-CM | POA: Diagnosis not present

## 2020-02-16 DIAGNOSIS — C7910 Secondary malignant neoplasm of unspecified urinary organs: Secondary | ICD-10-CM

## 2020-02-16 DIAGNOSIS — M7989 Other specified soft tissue disorders: Secondary | ICD-10-CM | POA: Diagnosis not present

## 2020-02-16 DIAGNOSIS — I129 Hypertensive chronic kidney disease with stage 1 through stage 4 chronic kidney disease, or unspecified chronic kidney disease: Secondary | ICD-10-CM | POA: Diagnosis not present

## 2020-02-16 DIAGNOSIS — N184 Chronic kidney disease, stage 4 (severe): Secondary | ICD-10-CM

## 2020-02-16 LAB — CBC WITH DIFFERENTIAL/PLATELET
Abs Immature Granulocytes: 0.03 10*3/uL (ref 0.00–0.07)
Basophils Absolute: 0 10*3/uL (ref 0.0–0.1)
Basophils Relative: 1 %
Eosinophils Absolute: 0.1 10*3/uL (ref 0.0–0.5)
Eosinophils Relative: 2 %
HCT: 31 % — ABNORMAL LOW (ref 36.0–46.0)
Hemoglobin: 9.8 g/dL — ABNORMAL LOW (ref 12.0–15.0)
Immature Granulocytes: 1 %
Lymphocytes Relative: 26 %
Lymphs Abs: 1.6 10*3/uL (ref 0.7–4.0)
MCH: 24.1 pg — ABNORMAL LOW (ref 26.0–34.0)
MCHC: 31.6 g/dL (ref 30.0–36.0)
MCV: 76.2 fL — ABNORMAL LOW (ref 80.0–100.0)
Monocytes Absolute: 0.5 10*3/uL (ref 0.1–1.0)
Monocytes Relative: 7 %
Neutro Abs: 3.9 10*3/uL (ref 1.7–7.7)
Neutrophils Relative %: 63 %
Platelets: 258 10*3/uL (ref 150–400)
RBC: 4.07 MIL/uL (ref 3.87–5.11)
RDW: 13.5 % (ref 11.5–15.5)
WBC: 6.1 10*3/uL (ref 4.0–10.5)
nRBC: 0 % (ref 0.0–0.2)

## 2020-02-16 LAB — COMPREHENSIVE METABOLIC PANEL
ALT: 14 U/L (ref 0–44)
AST: 22 U/L (ref 15–41)
Albumin: 4.5 g/dL (ref 3.5–5.0)
Alkaline Phosphatase: 86 U/L (ref 38–126)
Anion gap: 12 (ref 5–15)
BUN: 49 mg/dL — ABNORMAL HIGH (ref 8–23)
CO2: 20 mmol/L — ABNORMAL LOW (ref 22–32)
Calcium: 9.3 mg/dL (ref 8.9–10.3)
Chloride: 107 mmol/L (ref 98–111)
Creatinine, Ser: 2.41 mg/dL — ABNORMAL HIGH (ref 0.44–1.00)
GFR calc Af Amer: 23 mL/min — ABNORMAL LOW (ref >60)
GFR calc non Af Amer: 20 mL/min — ABNORMAL LOW (ref >60)
Glucose, Bld: 163 mg/dL — ABNORMAL HIGH (ref 70–99)
Potassium: 4.6 mmol/L (ref 3.5–5.1)
Sodium: 139 mmol/L (ref 135–145)
Total Bilirubin: 0.5 mg/dL (ref 0.3–1.2)
Total Protein: 8.6 g/dL — ABNORMAL HIGH (ref 6.5–8.1)

## 2020-02-16 MED ORDER — HEPARIN SOD (PORK) LOCK FLUSH 100 UNIT/ML IV SOLN
500.0000 [IU] | Freq: Once | INTRAVENOUS | Status: AC
  Administered 2020-02-16: 500 [IU] via INTRAVENOUS
  Filled 2020-02-16: qty 5

## 2020-02-16 MED ORDER — SODIUM CHLORIDE 0.9% FLUSH
10.0000 mL | Freq: Once | INTRAVENOUS | Status: AC
  Administered 2020-02-16: 10 mL via INTRAVENOUS
  Filled 2020-02-16: qty 10

## 2020-02-16 NOTE — Progress Notes (Addendum)
Hematology/Oncology follow up note [facility] [phone] [phone]   Patient Care Team: Lane, Rachel Elizabeth, PA-C as PCP - General (Family Medicine) Nice, Keith B, OD (Optometry) Rochell Puett, MD as Consulting Physician (Hematology and Oncology)  REFERRING PROVIDER: Dr.Sninsky CHIEF COMPLAINTS/REASON FOR VISIT:  Follow up for bladder cancer, anemia.   HISTORY OF PRESENTING ILLNESS:  Angela Russell is a  69 y.o.  female with PMH listed below who was referred to me for evaluation of newly diagnosed bladder cancer. Patient initially presented to emergency room at the end of January 2020 for evaluation of dysuria, hematuria and the left lower quadrant inguinal pain and flank pain.  1/28 2020 CT renal stone study showed suspected irregular wall thickening about the superior bladder, not well assessed due to degree of bladder distention.  Recommend cystoscopy for further evaluation.  No renal stone or obstructive uropathy.  Patient was given IV Rocephin and referred patient for outpatient urology follow-up.  Urine culture was negative.  She again presented to ER after 2 days with similar symptoms.  Patient has 25-pack-year smoking history, quit approximately 20 years ago.  No family history of any urology malignancies 07/03/2018 another CT abdomen pelvis with contrast was done which showed no nephrolithiasis or hydronephrosis is identified.  Bladder is decompressed limiting evaluation.  07/16/2018.urology Dr. Sinskey - cystoscopy and bilateral retrograde pyelogram on 07/16/2018.  Pyelogram did not show any filling defect or abnormalities.  No hydronephrosis.  Ureteral orifice was not involved with tumor.  There is a large 5 cm posterior wall bladder tumor, bullous and sessile appearing.  Patient underwent TURBT.   Pathology: High-grade urothelial carcinoma, invasive into muscularis propria.  Lymphovascular invasion is present.  Carcinoma in situ is also  identified.  Focal squamous differentiation is noted, areas of invasive carcinoma display pleomorphic/sarcomatoid changes.  T2b  08/07/2018 CT without contrast negative.  2 subpleural right upper lobe nodule 2 to 3 mm likely benign. She also had baseline audiometry done.  08/04/2018 ddMVAC x 1 cycle, stopped due to intolerance and AKI.  Patient received 1 cycle of dd MVAC, not able to tolerate due to AKI. Patient then was referred to [facility]  09/22/2018 patient underwent a cystectomy, pathology pT3a N0 Mx.  11 lymph nodes were harvested and was all negative. Invasive urothelia carcinoma, high grade, with sarcomatoid features.   10/14/2018 patient was admitted due to pyelonephritis and a pelvic fluid collection.  Drain was placed. 10/23/2018 drain was removed. Patient has had difficulties getting to her appointments to [facility]. 02/09/2019, patient presented with abdominal pain. CT concerning for small bowel obstruction and increased size of right pelvic fluid collection concerning for cancer recurrence.  Right hydronephrosis to the level of pelvis and enlarged lymph nodes. Patient had JP drain placed with CT guidance to pelvic fluid collection and drained 400 cc amber fluid.  Culture was negative for growth of microorganisms and cytology was negative for malignancy.-JP drain was removed on the day of discharge. CT-guided core biopsy of pelvic lymph node adenopathy was attempted but not successful.  # Right lower extremity DVT, provoked by Transvenous biopsy  02/26/2019 transvenous biopsy by IR unsuccessful transvenous biopsy of the right pelvis mass. Post biopsy acute thrombus in the right external iliac and common femoral vein.  Patient was recommended to start anticoagulation with Xarelto however due to the co-pay, patient is not able to afford the medication. Anticoagulation regimen was switched to Eliquis 5 mg.  Patient reports that she has been taking   it once a day. 03/20/2019 PET showed  FDG avid tissue in the cystectomy bed, retroperitoneal and pelvic lymphadenopathy.  Right common iliac DVT.  # increased right lower extremity swelling.  She was started on Eliquis for anticoagulation by [facility].  We clarified with her pharmacy and she was actually taking Eliquis 2.5 mg twice daily. Right lower extremity swelling has not improved but instead worsened. 03/16/2019 She had ultrasound right lower extremity done which showed persistent extensive proximal right lower extremity DVT.  Anticoagulation regimen has increased to Eliquis 5 mg twice daily.  # establish care with [facility] oncology Dr. [name] for evaluation.  Dr. [name] recommended starting immunotherapy with PD-L1 inhibitor Pembrolizumab 200 mg every 3 weeks.  The sarcomatoid histology may not respond to chemotherapy well, could portend a better chance of response into immunotherapy PET scan after 4 cycles of Keytruda showed single mildly enlarged central mesenteric lymph node in the upper pelvis with SUV 9.6.  No other abnormal hypermetabolic activity was reported.  # 03/30/2019 Status post IVC filter placement, mechanical thrombectomy.-IVC filter was retrieved in January 2021. # PD-L1 CPS 100%.  # 03/31/2019 started on immunotherapy Keytruda.   INTERVAL HISTORY Angela Russell is a 69 y.o. female who has above history reviewed by me today presents for follow up visit for evaluation form metastatic high-grade urothelial carcinoma of the bladder. Sarcomatoid feature.  Patient has been on immunotherapy.  Chronic intermittent abdomen discomfort. Recent PET and  CT scan showed no findings of metastatic disease.  No nausea vomiting or diarrhea.  Review of Systems  Constitutional: Negative for appetite change, chills, fatigue and fever.  HENT:   Negative for hearing loss and voice change.   Eyes: Negative for eye problems.  Respiratory: Negative for chest tightness and cough.   Cardiovascular: Negative for chest pain and leg  swelling.  Gastrointestinal: Negative for abdominal distention, abdominal pain and blood in stool.       Abdominal discomfort.   Endocrine: Negative for hot flashes.  Genitourinary: Negative for difficulty urinating and frequency.   Musculoskeletal: Negative for arthralgias.  Skin: Negative for itching and rash.  Neurological: Positive for numbness. Negative for extremity weakness.  Hematological: Negative for adenopathy.  Psychiatric/Behavioral: Negative for confusion. The patient is not nervous/anxious.     MEDICAL HISTORY:  Past Medical History:  Diagnosis Date  . Carotid artery plaque, right 01/2014  . CKD (chronic kidney disease)    stage 2-3  . Diabetes mellitus without complication ([place])   . DM (diabetes mellitus), type 2, uncontrolled ([place])   . Hyperlipidemia   . Hypertension   . Hypochromic microcytic anemia    mild  . Metastatic urothelial carcinoma ([place]) 03/23/2019  . Osteoporosis   . Renal insufficiency   . Rotator cuff tendonitis, right     SURGICAL HISTORY: Past Surgical History:  Procedure Laterality Date  . ABDOMINAL HYSTERECTOMY  2000   due to bleeding and fibroids, partial- still has ovaries  . CYSTOSCOPY W/ RETROGRADES Bilateral 07/16/2018   Procedure: CYSTOSCOPY WITH RETROGRADE PYELOGRAM;  Surgeon: Billey Co, MD;  Location: [facility];  Service: Urology;  Laterality: Bilateral;  . IVC FILTER INSERTION N/A 03/30/2019   Procedure: IVC FILTER INSERTION;  Surgeon: Algernon Huxley, MD;  Location: [place] [facility];  Service: Cardiovascular;  Laterality: N/A;  . IVC FILTER REMOVAL N/A 06/15/2019   Procedure: IVC FILTER REMOVAL;  Surgeon: Algernon Huxley, MD;  Location: [place] [facility];  Service: Cardiovascular;  Laterality: N/A;  . KNEE SURGERY  Left 03/17/2013   torn meniscus  . PERIPHERAL VASCULAR THROMBECTOMY Right 03/30/2019   Procedure: PERIPHERAL VASCULAR THROMBECTOMY;  Surgeon: Algernon Huxley, MD;  Location: [place] [facility];  Service:  Cardiovascular;  Laterality: Right;  . PORTA CATH INSERTION N/A 08/06/2018   Procedure: PORTA CATH INSERTION;  Surgeon: Algernon Huxley, MD;  Location: [place] [facility];  Service: Cardiovascular;  Laterality: N/A;  . TRANSURETHRAL RESECTION OF BLADDER TUMOR N/A 07/16/2018   Procedure: TRANSURETHRAL RESECTION OF BLADDER TUMOR (TURBT);  Surgeon: Billey Co, MD;  Location: [facility];  Service: Urology;  Laterality: N/A;    SOCIAL HISTORY: Social History   Socioeconomic History  . Marital status: Single    Spouse name: Not on file  . Number of children: 1  . Years of education: Not on file  . Highest education level: 10th grade  Occupational History  . Not on file  Tobacco Use  . Smoking status: Former Smoker    Quit date: 06/05/1991    Years since quitting: 28.7  . Smokeless tobacco: Never Used  Vaping Use  . Vaping Use: Never used  Substance and Sexual Activity  . Alcohol use: No  . Drug use: No  . Sexual activity: Never  Other Topics Concern  . Not on file  Social History Narrative   Working full time   Social Determinants of Radio broadcast assistant Strain:   . Difficulty of Paying Living Expenses: Not on file  Food Insecurity:   . Worried About Charity fundraiser in the Last Year: Not on file  . Ran Out of Food in the Last Year: Not on file  Transportation Needs:   . Lack of Transportation (Medical): Not on file  . Lack of Transportation (Non-Medical): Not on file  Physical Activity:   . Days of Exercise per Week: Not on file  . Minutes of Exercise per Session: Not on file  Stress:   . Feeling of Stress : Not on file  Social Connections:   . Frequency of Communication with Friends and Family: Not on file  . Frequency of Social Gatherings with Friends and Family: Not on file  . Attends Religious Services: Not on file  . Active Member of Clubs or Organizations: Not on file  . Attends Archivist Meetings: Not on file  . Marital Status: Not on file    Intimate Partner Violence:   . Fear of Current or Ex-Partner: Not on file  . Emotionally Abused: Not on file  . Physically Abused: Not on file  . Sexually Abused: Not on file    FAMILY HISTORY: Family History  Problem Relation Age of Onset  . Heart disease Mother   . Heart attack Mother   . Arthritis Father   . Diabetes Brother     ALLERGIES:  has No Known Allergies.  MEDICATIONS:  Current Outpatient Medications  Medication Sig Dispense Refill  . amLODipine (NORVASC) 5 MG tablet Take 1 tablet (5 mg total) by mouth daily. 90 tablet 1  . diclofenac sodium (VOLTAREN) 1 % GEL Apply 2 g topically 4 (four) times daily. 100 g 2  . docusate sodium (COLACE) 100 MG capsule Take 1 capsule (100 mg total) by mouth daily. 30 capsule 1  . lidocaine-prilocaine (EMLA) cream Apply to affected area once 30 g 3  . polyethylene glycol (MIRALAX / GLYCOLAX) 17 g packet Take 17 g by mouth daily as needed for mild constipation. 14 each 0  . rosuvastatin (CRESTOR)  40 MG tablet Take 1 tablet (40 mg total) by mouth daily. 90 tablet 1  . senna-docusate (SENOKOT-S) 8.6-50 MG tablet Take 1 tablet by mouth 2 (two) times daily. OK to hold or decrease to 1x per day if diarrhea (Patient not taking: Reported on 12/29/2019) 30 tablet 3   No current facility-administered medications for this visit.     PHYSICAL EXAMINATION: ECOG PERFORMANCE STATUS: 1 - Symptomatic but completely ambulatory Vitals:   02/16/20 1320  BP: (!) 146/69  Pulse: (!) 103  Resp: 18  Temp: (!) 96.4 F (35.8 C)   Filed Weights   02/16/20 1320  Weight: 202 lb 9.6 oz (91.9 kg)    Physical Exam Constitutional:      General: She is not in acute distress.    Comments: Walk independently  HENT:     Head: Normocephalic and atraumatic.  Eyes:     General: No scleral icterus.    Pupils: Pupils are equal, round, and reactive to light.  Cardiovascular:     Rate and Rhythm: Normal rate and regular rhythm.     Heart sounds: Normal heart  sounds.  Pulmonary:     Effort: Pulmonary effort is normal. No respiratory distress.     Breath sounds: No wheezing.  Abdominal:     General: Bowel sounds are normal. There is no distension.     Palpations: Abdomen is soft. There is no mass.  Musculoskeletal:        General: No deformity. Normal range of motion.     Cervical back: Normal range of motion and neck supple.  Skin:    General: Skin is warm and dry.     Coloration: Skin is not pale.     Findings: No erythema or rash.     Comments: Ureterostomy, with light yellow color urine in the ostomy bag.  Neurological:     Mental Status: She is alert and oriented to person, place, and time. Mental status is at baseline.     Cranial Nerves: No cranial nerve deficit.     Coordination: Coordination normal.  Psychiatric:        Mood and Affect: Mood normal.     RADIOGRAPHIC STUDIES: I have personally reviewed the radiological images as listed and agreed with the findings in the report.  CMP Latest Ref Rng & Units 02/16/2020  Glucose 70 - 99 mg/dL 163(H)  BUN 8 - 23 mg/dL 49(H)  Creatinine 0.44 - 1.00 mg/dL 2.41(H)  Sodium 135 - 145 mmol/L 139  Potassium 3.5 - 5.1 mmol/L 4.6  Chloride 98 - 111 mmol/L 107  CO2 22 - 32 mmol/L 20(L)  Calcium 8.9 - 10.3 mg/dL 9.3  Total Protein 6.5 - 8.1 g/dL 8.6(H)  Total Bilirubin 0.3 - 1.2 mg/dL 0.5  Alkaline Phos 38 - 126 U/L 86  AST 15 - 41 U/L 22  ALT 0 - 44 U/L 14   CBC Latest Ref Rng & Units 02/16/2020  WBC 4.0 - 10.5 K/uL 6.1  Hemoglobin 12.0 - 15.0 g/dL 9.8(L)  Hematocrit 36 - 46 % 31.0(L)  Platelets 150 - 400 K/uL 258    LABORATORY DATA:  I have reviewed the data as listed Lab Results  Component Value Date   WBC 6.1 02/16/2020   HGB 9.8 (L) 02/16/2020   HCT 31.0 (L) 02/16/2020   MCV 76.2 (L) 02/16/2020   PLT 258 02/16/2020   Recent Labs    01/19/20 1330 02/09/20 0832 02/16/20 1307  NA 141 138 139  K 4.2  4.4 4.6  CL 109 104 107  CO2 21* 21* 20*  GLUCOSE 155* 228* 163*   BUN 29* 41* 49*  CREATININE 1.53* 2.35* 2.41*  CALCIUM 9.3 9.0 9.3  GFRNONAA 34* 20* 20*  GFRAA 40* 24* 23*  PROT 8.4* 7.9 8.6*  ALBUMIN 4.5 4.3 4.5  AST _0 ALT _1 ALKPHOS 93 100 86  BILITOT 0.5 0.6 0.5   Iron/TIBC/Ferritin/ %Sat    Component Value Date/Time   IRON 73 11/17/2019 0936   IRON 26 (L) 02/04/2019 1309   TIBC 316 11/17/2019 0936   TIBC 251 02/04/2019 1309   FERRITIN 256 11/17/2019 0936   FERRITIN 409 (H) 02/04/2019 1309   IRONPCTSAT 23 11/17/2019 0936   IRONPCTSAT 10 (L) 02/04/2019 1309    RADIOGRAPHIC STUDIES: I have personally reviewed the radiological images as listed and agreed with the findings in the report. CT Abdomen Pelvis Wo Contrast  Result Date: 01/06/2020 CLINICAL DATA:  Restaging metastatic bladder cancer. History of cystectomy and neobladder surgery in April 2020. EXAM: CT CHEST, ABDOMEN AND PELVIS WITHOUT CONTRAST TECHNIQUE: Multidetector CT imaging of the chest, abdomen and pelvis was performed following the standard protocol without IV contrast. COMPARISON:  PET-CT 09/01/2019 FINDINGS: CT CHEST FINDINGS Cardiovascular: The heart is within normal limits in size and stable. No pericardial effusion. Stable aortic calcifications but no aneurysm. Stable three-vessel coronary artery calcifications. Mediastinum/Nodes: Small scattered mediastinal and hilar lymph nodes are stable. No mass or overt adenopathy the. The esophagus is grossly normal. The thyroid gland is unremarkable. Lungs/Pleura: Stable emphysematous changes and areas of pulmonary scarring. No worrisome pulmonary nodules to suggest pulmonary metastatic disease. No pleural effusions or pleural lesions. Stable pericardial cyst near the right inferior pulmonary vein. Musculoskeletal: No significant bony findings. CT ABDOMEN PELVIS FINDINGS Hepatobiliary: No hepatic lesions are identified without contrast. The gallbladder is unremarkable. No common bile duct dilatation. Pancreas: No mass,  inflammation or ductal dilatation. Spleen: Normal size.  No focal lesions. Adrenals/Urinary Tract: The adrenal glands and kidneys are unremarkable and stable. No worrisome renal lesions or hydronephrosis. Stable surgical changes from a urinary diversion procedure with an ileal conduit. No complicating features are identified. Status post cystectomy. Stomach/Bowel: The stomach, duodenum, small bowel and colon are unremarkable. No acute inflammatory changes, mass lesions or obstructive findings. The terminal ileum is normal. The appendix is normal. Vascular/Lymphatic: Stable advanced atherosclerotic calcifications involving the aorta and branch vessels. Right iliac artery stent is noted. No mesenteric, retroperitoneal or pelvic lymphadenopathy. A few small stable scattered lobe mesenteric lymph nodes are noted. The 6 mm nodule on image 81/2 is unchanged. Reproductive: Surgically absent. Other: Small amount of free pelvic fluid is noted. No pelvic or inguinal adenopathy. Musculoskeletal: No significant bony findings. Moderate lower lumbar facet disease, SI joint degenerative changes and hip joint degenerative changes. IMPRESSION: 1. Stable surgical changes from a urinary diversion procedure with an ileal conduit. No complicating features are identified. 2. No findings for metastatic disease involving the chest, abdomen or pelvis. 3. Stable emphysematous changes and pulmonary scarring. 4. Stable advanced atherosclerotic calcifications involving the thoracic and abdominal aorta and branch vessels including the coronary arteries. 5. Emphysema and aortic atherosclerosis. Aortic Atherosclerosis (ICD10-I70.0) and Emphysema (ICD10-J43.9). Electronically Signed   By: Marijo Sanes M.D.   On: 01/06/2020 11:48   CT Chest Wo Contrast  Result Date: 01/06/2020 CLINICAL DATA:  Restaging metastatic bladder cancer. History of cystectomy and neobladder surgery in April 2020. EXAM: CT CHEST, ABDOMEN AND PELVIS WITHOUT CONTRAST  TECHNIQUE: Multidetector CT imaging of the chest, abdomen and pelvis was performed following the standard protocol without IV contrast. COMPARISON:  PET-CT 09/01/2019 FINDINGS: CT CHEST FINDINGS Cardiovascular: The heart is within normal limits in size and stable. No pericardial effusion. Stable aortic calcifications but no aneurysm. Stable three-vessel coronary artery calcifications. Mediastinum/Nodes: Small scattered mediastinal and hilar lymph nodes are stable. No mass or overt adenopathy the. The esophagus is grossly normal. The thyroid gland is unremarkable. Lungs/Pleura: Stable emphysematous changes and areas of pulmonary scarring. No worrisome pulmonary nodules to suggest pulmonary metastatic disease. No pleural effusions or pleural lesions. Stable pericardial cyst near the right inferior pulmonary vein. Musculoskeletal: No significant bony findings. CT ABDOMEN PELVIS FINDINGS Hepatobiliary: No hepatic lesions are identified without contrast. The gallbladder is unremarkable. No common bile duct dilatation. Pancreas: No mass, inflammation or ductal dilatation. Spleen: Normal size.  No focal lesions. Adrenals/Urinary Tract: The adrenal glands and kidneys are unremarkable and stable. No worrisome renal lesions or hydronephrosis. Stable surgical changes from a urinary diversion procedure with an ileal conduit. No complicating features are identified. Status post cystectomy. Stomach/Bowel: The stomach, duodenum, small bowel and colon are unremarkable. No acute inflammatory changes, mass lesions or obstructive findings. The terminal ileum is normal. The appendix is normal. Vascular/Lymphatic: Stable advanced atherosclerotic calcifications involving the aorta and branch vessels. Right iliac artery stent is noted. No mesenteric, retroperitoneal or pelvic lymphadenopathy. A few small stable scattered lobe mesenteric lymph nodes are noted. The 6 mm nodule on image 81/2 is unchanged. Reproductive: Surgically absent.  Other: Small amount of free pelvic fluid is noted. No pelvic or inguinal adenopathy. Musculoskeletal: No significant bony findings. Moderate lower lumbar facet disease, SI joint degenerative changes and hip joint degenerative changes. IMPRESSION: 1. Stable surgical changes from a urinary diversion procedure with an ileal conduit. No complicating features are identified. 2. No findings for metastatic disease involving the chest, abdomen or pelvis. 3. Stable emphysematous changes and pulmonary scarring. 4. Stable advanced atherosclerotic calcifications involving the thoracic and abdominal aorta and branch vessels including the coronary arteries. 5. Emphysema and aortic atherosclerosis. Aortic Atherosclerosis (ICD10-I70.0) and Emphysema (ICD10-J43.9). Electronically Signed   By: Marijo Sanes M.D.   On: 01/06/2020 11:48     ASSESSMENT & PLAN:  1. Metastatic urothelial carcinoma (HCC)   2. Elevated serum creatinine   3. Anemia in stage 4 chronic kidney disease (HCC)   4. Stage 4 chronic kidney disease ([place])   Cancer Staging Bladder carcinoma [facility]) Staging form: Urinary Bladder, AJCC 8th Edition - Clinical stage from 08/01/2018: Stage IVA (cTX, cN3, cM1a) - Signed by [name], MD on 04/21/2019  #Metastatic urothelial carcinoma of bladder, sarcomatoid features pelvic sidewall mass showed metastatic carcinoma consistent with involvement by urothelial carcinoma. Patient has been on Keytruda every 3 weeks  Labs are reviewed and discussed with patient. Hold immunotherapy today due to worsening of kidney function.  # Rapid deteriation of kidney function.  Check US kidney.  Hold treament.  With further questioning, she reports use of NSAIDs recently, on average 3 times per week after recent wrist surgery. Advised patient to avoid NSAIDs and other nephrotoxins.  Encourage oral nutrition. Recommend patient to follow-up with patient urology.  #Anemia in stage III chronic kidney disease, hemoglobin is 9.8.   Patient has microcytosis secondary to alpha thalassemia trait. Stable counts.  Monitor.  #Right lower extremity  femoral vein DVT provoked by transvenous biopsy in the context of cancer recurrence, Status post IV C filter and mechanical thrombectomy.  IVC filter  has been removed. She is currently off anticoagulation due to hematuria.  #Follow-up in 2 weeks for reevaluation of resuming immunotherapy. All questions were answered. The patient knows to call the clinic with any problems questions or concerns.  [name], MD, PhD Hematology Oncology [facility] at [facility] Pager- [phone] 02/16/20

## 2020-02-16 NOTE — Progress Notes (Signed)
Pt here for follow up. No new concerns voiced.   

## 2020-02-17 ENCOUNTER — Telehealth: Payer: Self-pay

## 2020-02-17 NOTE — Telephone Encounter (Signed)
error 

## 2020-02-19 ENCOUNTER — Other Ambulatory Visit: Payer: Self-pay

## 2020-02-19 ENCOUNTER — Ambulatory Visit
Admission: RE | Admit: 2020-02-19 | Discharge: 2020-02-19 | Disposition: A | Discharge status: Home / Self Care | Payer: Medicare Other | Source: Ambulatory Visit | Attending: Oncology | Admitting: Oncology

## 2020-02-19 DIAGNOSIS — C7910 Secondary malignant neoplasm of unspecified urinary organs: Secondary | ICD-10-CM

## 2020-02-19 DIAGNOSIS — R7989 Other specified abnormal findings of blood chemistry: Secondary | ICD-10-CM | POA: Diagnosis not present

## 2020-02-25 ENCOUNTER — Telehealth: Payer: Self-pay

## 2020-02-25 NOTE — Telephone Encounter (Signed)
I have sent recent MD note and labs with a request for patient to see Dr. Candiss Norse due to rapidly decreasing of kidney functions.  Called the office today to confirm receipt of request and they are not sure if received but will call the patient to schedule an appt.

## 2020-03-01 ENCOUNTER — Other Ambulatory Visit: Payer: Self-pay

## 2020-03-01 ENCOUNTER — Inpatient Hospital Stay: Payer: Medicare Other

## 2020-03-01 ENCOUNTER — Encounter: Payer: Self-pay | Admitting: Oncology

## 2020-03-01 ENCOUNTER — Inpatient Hospital Stay (HOSPITAL_BASED_OUTPATIENT_CLINIC_OR_DEPARTMENT_OTHER): Payer: Medicare Other | Admitting: Oncology

## 2020-03-01 VITALS — BP 154/77 | HR 82 | Temp 96.9°F | Resp 18 | Wt 200.3 lb

## 2020-03-01 DIAGNOSIS — M7989 Other specified soft tissue disorders: Secondary | ICD-10-CM | POA: Diagnosis not present

## 2020-03-01 DIAGNOSIS — D631 Anemia in chronic kidney disease: Secondary | ICD-10-CM

## 2020-03-01 DIAGNOSIS — Z5112 Encounter for antineoplastic immunotherapy: Secondary | ICD-10-CM | POA: Diagnosis not present

## 2020-03-01 DIAGNOSIS — C679 Malignant neoplasm of bladder, unspecified: Secondary | ICD-10-CM | POA: Diagnosis not present

## 2020-03-01 DIAGNOSIS — C7910 Secondary malignant neoplasm of unspecified urinary organs: Secondary | ICD-10-CM

## 2020-03-01 DIAGNOSIS — Z86718 Personal history of other venous thrombosis and embolism: Secondary | ICD-10-CM

## 2020-03-01 DIAGNOSIS — N183 Chronic kidney disease, stage 3 unspecified: Secondary | ICD-10-CM | POA: Diagnosis not present

## 2020-03-01 DIAGNOSIS — I129 Hypertensive chronic kidney disease with stage 1 through stage 4 chronic kidney disease, or unspecified chronic kidney disease: Secondary | ICD-10-CM | POA: Diagnosis not present

## 2020-03-01 DIAGNOSIS — N184 Chronic kidney disease, stage 4 (severe): Secondary | ICD-10-CM

## 2020-03-01 LAB — CBC WITH DIFFERENTIAL/PLATELET
Abs Immature Granulocytes: 0.02 10*3/uL (ref 0.00–0.07)
Basophils Absolute: 0 10*3/uL (ref 0.0–0.1)
Basophils Relative: 1 %
Eosinophils Absolute: 0.2 10*3/uL (ref 0.0–0.5)
Eosinophils Relative: 3 %
HCT: 30.7 % — ABNORMAL LOW (ref 36.0–46.0)
Hemoglobin: 9.7 g/dL — ABNORMAL LOW (ref 12.0–15.0)
Immature Granulocytes: 0 %
Lymphocytes Relative: 24 %
Lymphs Abs: 1.3 10*3/uL (ref 0.7–4.0)
MCH: 24.3 pg — ABNORMAL LOW (ref 26.0–34.0)
MCHC: 31.6 g/dL (ref 30.0–36.0)
MCV: 76.8 fL — ABNORMAL LOW (ref 80.0–100.0)
Monocytes Absolute: 0.5 10*3/uL (ref 0.1–1.0)
Monocytes Relative: 8 %
Neutro Abs: 3.5 10*3/uL (ref 1.7–7.7)
Neutrophils Relative %: 64 %
Platelets: 230 10*3/uL (ref 150–400)
RBC: 4 MIL/uL (ref 3.87–5.11)
RDW: 13.6 % (ref 11.5–15.5)
WBC: 5.5 10*3/uL (ref 4.0–10.5)
nRBC: 0 % (ref 0.0–0.2)

## 2020-03-01 LAB — COMPREHENSIVE METABOLIC PANEL
ALT: 13 U/L (ref 0–44)
AST: 23 U/L (ref 15–41)
Albumin: 4.3 g/dL (ref 3.5–5.0)
Alkaline Phosphatase: 87 U/L (ref 38–126)
Anion gap: 10 (ref 5–15)
BUN: 33 mg/dL — ABNORMAL HIGH (ref 8–23)
CO2: 22 mmol/L (ref 22–32)
Calcium: 9.3 mg/dL (ref 8.9–10.3)
Chloride: 108 mmol/L (ref 98–111)
Creatinine, Ser: 2.08 mg/dL — ABNORMAL HIGH (ref 0.44–1.00)
GFR calc Af Amer: 27 mL/min — ABNORMAL LOW (ref >60)
GFR calc non Af Amer: 24 mL/min — ABNORMAL LOW (ref >60)
Glucose, Bld: 190 mg/dL — ABNORMAL HIGH (ref 70–99)
Potassium: 4.6 mmol/L (ref 3.5–5.1)
Sodium: 140 mmol/L (ref 135–145)
Total Bilirubin: 0.5 mg/dL (ref 0.3–1.2)
Total Protein: 7.8 g/dL (ref 6.5–8.1)

## 2020-03-01 MED ORDER — HEPARIN SOD (PORK) LOCK FLUSH 100 UNIT/ML IV SOLN
INTRAVENOUS | Status: AC
  Filled 2020-03-01: qty 5

## 2020-03-01 MED ORDER — SODIUM CHLORIDE 0.9 % IV SOLN
Freq: Once | INTRAVENOUS | Status: AC
  Filled 2020-03-01: qty 250

## 2020-03-01 MED ORDER — SODIUM CHLORIDE 0.9% FLUSH
10.0000 mL | INTRAVENOUS | Status: DC | PRN
  Administered 2020-03-01: 10 mL via INTRAVENOUS
  Filled 2020-03-01: qty 10

## 2020-03-01 MED ORDER — HEPARIN SOD (PORK) LOCK FLUSH 100 UNIT/ML IV SOLN
500.0000 [IU] | Freq: Once | INTRAVENOUS | Status: AC
  Administered 2020-03-01: 500 [IU] via INTRAVENOUS
  Filled 2020-03-01: qty 5

## 2020-03-01 MED ORDER — SODIUM CHLORIDE 0.9 % IV SOLN
200.0000 mg | Freq: Once | INTRAVENOUS | Status: AC
  Administered 2020-03-01: 200 mg via INTRAVENOUS
  Filled 2020-03-01: qty 8

## 2020-03-01 NOTE — Progress Notes (Signed)
Pt here for follow up. No new concerns voiced.   

## 2020-03-01 NOTE — Progress Notes (Signed)
Hematology/Oncology follow up note [facility] [phone] [phone]   Patient Care Team: Lane, Rachel Elizabeth, PA-C as PCP - General (Family Medicine) Nice, Keith B, OD (Optometry) Lakeria Starkman, MD as Consulting Physician (Hematology and Oncology)  REFERRING PROVIDER: Dr.Sninsky CHIEF COMPLAINTS/REASON FOR VISIT:  Follow up for bladder cancer, anemia.   HISTORY OF PRESENTING ILLNESS:  Angela Russell is a  69 y.o.  female with PMH listed below who was referred to me for evaluation of newly diagnosed bladder cancer. Patient initially presented to emergency room at the end of January 2020 for evaluation of dysuria, hematuria and the left lower quadrant inguinal pain and flank pain.  1/28 2020 CT renal stone study showed suspected irregular wall thickening about the superior bladder, not well assessed due to degree of bladder distention.  Recommend cystoscopy for further evaluation.  No renal stone or obstructive uropathy.  Patient was given IV Rocephin and referred patient for outpatient urology follow-up.  Urine culture was negative.  She again presented to ER after 2 days with similar symptoms.  Patient has 25-pack-year smoking history, quit approximately 20 years ago.  No family history of any urology malignancies 07/03/2018 another CT abdomen pelvis with contrast was done which showed no nephrolithiasis or hydronephrosis is identified.  Bladder is decompressed limiting evaluation.  07/16/2018.urology Dr. Sinskey - cystoscopy and bilateral retrograde pyelogram on 07/16/2018.  Pyelogram did not show any filling defect or abnormalities.  No hydronephrosis.  Ureteral orifice was not involved with tumor.  There is a large 5 cm posterior wall bladder tumor, bullous and sessile appearing.  Patient underwent TURBT.   Pathology: High-grade urothelial carcinoma, invasive into muscularis propria.  Lymphovascular invasion is present.  Carcinoma in situ is also  identified.  Focal squamous differentiation is noted, areas of invasive carcinoma display pleomorphic/sarcomatoid changes.  T2b  08/07/2018 CT without contrast negative.  2 subpleural right upper lobe nodule 2 to 3 mm likely benign. She also had baseline audiometry done.  08/04/2018 ddMVAC x 1 cycle, stopped due to intolerance and AKI.  Patient received 1 cycle of dd MVAC, not able to tolerate due to AKI. Patient then was referred to [facility]  09/22/2018 patient underwent a cystectomy, pathology pT3a N0 Mx.  11 lymph nodes were harvested and was all negative. Invasive urothelia carcinoma, high grade, with sarcomatoid features.   10/14/2018 patient was admitted due to pyelonephritis and a pelvic fluid collection.  Drain was placed. 10/23/2018 drain was removed. Patient has had difficulties getting to her appointments to [facility]. 02/09/2019, patient presented with abdominal pain. CT concerning for small bowel obstruction and increased size of right pelvic fluid collection concerning for cancer recurrence.  Right hydronephrosis to the level of pelvis and enlarged lymph nodes. Patient had JP drain placed with CT guidance to pelvic fluid collection and drained 400 cc amber fluid.  Culture was negative for growth of microorganisms and cytology was negative for malignancy.-JP drain was removed on the day of discharge. CT-guided core biopsy of pelvic lymph node adenopathy was attempted but not successful.  # Right lower extremity DVT, provoked by Transvenous biopsy  02/26/2019 transvenous biopsy by IR unsuccessful transvenous biopsy of the right pelvis mass. Post biopsy acute thrombus in the right external iliac and common femoral vein.  Patient was recommended to start anticoagulation with Xarelto however due to the co-pay, patient is not able to afford the medication. Anticoagulation regimen was switched to Eliquis 5 mg.  Patient reports that she has been taking   it once a day. 03/20/2019 PET showed  FDG avid tissue in the cystectomy bed, retroperitoneal and pelvic lymphadenopathy.  Right common iliac DVT.  # increased right lower extremity swelling.  She was started on Eliquis for anticoagulation by [facility].  We clarified with her pharmacy and she was actually taking Eliquis 2.5 mg twice daily. Right lower extremity swelling has not improved but instead worsened. 03/16/2019 She had ultrasound right lower extremity done which showed persistent extensive proximal right lower extremity DVT.  Anticoagulation regimen has increased to Eliquis 5 mg twice daily.  # establish care with [facility] oncology Dr. [name] for evaluation.  Dr. [name] recommended starting immunotherapy with PD-L1 inhibitor Pembrolizumab 200 mg every 3 weeks.  The sarcomatoid histology may not respond to chemotherapy well, could portend a better chance of response into immunotherapy PET scan after 4 cycles of Keytruda showed single mildly enlarged central mesenteric lymph node in the upper pelvis with SUV 9.6.  No other abnormal hypermetabolic activity was reported.  # 03/30/2019 Status post IVC filter placement, mechanical thrombectomy.-IVC filter was retrieved in January 2021. # PD-L1 CPS 100%.  # 03/31/2019 started on immunotherapy Keytruda.   INTERVAL HISTORY Angela Russell is a 69 y.o. female who has above history reviewed by me today presents for follow up visit for evaluation form metastatic high-grade urothelial carcinoma of the bladder. Sarcomatoid feature.  Patient has been on immunotherapy, last treatment was held due to acute on chronic kidney failure. Patient was advised not to use any more NSAIDs.  An increase oral hydration. Today patient presents for follow-up.  No new complaints. She has diabetes and was on diabetic medication in the past which was discontinued when she was very sick with urothelial carcinoma.  Review of Systems  Constitutional: Negative for appetite change, chills, fatigue and fever.  HENT:    Negative for hearing loss and voice change.   Eyes: Negative for eye problems.  Respiratory: Negative for chest tightness and cough.   Cardiovascular: Negative for chest pain and leg swelling.  Gastrointestinal: Negative for abdominal distention, abdominal pain and blood in stool.       Abdominal discomfort.   Endocrine: Negative for hot flashes.  Genitourinary: Negative for difficulty urinating and frequency.   Musculoskeletal: Negative for arthralgias.  Skin: Negative for itching and rash.  Neurological: Positive for numbness. Negative for extremity weakness.  Hematological: Negative for adenopathy.  Psychiatric/Behavioral: Negative for confusion. The patient is not nervous/anxious.     MEDICAL HISTORY:  Past Medical History:  Diagnosis Date  . Carotid artery plaque, right 01/2014  . CKD (chronic kidney disease)    stage 2-3  . Diabetes mellitus without complication ([place])   . DM (diabetes mellitus), type 2, uncontrolled ([place])   . Hyperlipidemia   . Hypertension   . Hypochromic microcytic anemia    mild  . Metastatic urothelial carcinoma ([place]) 03/23/2019  . Osteoporosis   . Renal insufficiency   . Rotator cuff tendonitis, right     SURGICAL HISTORY: Past Surgical History:  Procedure Laterality Date  . ABDOMINAL HYSTERECTOMY  2000   due to bleeding and fibroids, partial- still has ovaries  . CYSTOSCOPY W/ RETROGRADES Bilateral 07/16/2018   Procedure: CYSTOSCOPY WITH RETROGRADE PYELOGRAM;  Surgeon: Billey Co, MD;  Location: [facility];  Service: Urology;  Laterality: Bilateral;  . IVC FILTER INSERTION N/A 03/30/2019   Procedure: IVC FILTER INSERTION;  Surgeon: Algernon Huxley, MD;  Location: [place] [facility];  Service: Cardiovascular;  Laterality: N/A;  .  IVC FILTER REMOVAL N/A 06/15/2019   Procedure: IVC FILTER REMOVAL;  Surgeon: Algernon Huxley, MD;  Location: [place] [facility];  Service: Cardiovascular;  Laterality: N/A;  . KNEE SURGERY Left 03/17/2013   torn  meniscus  . PERIPHERAL VASCULAR THROMBECTOMY Right 03/30/2019   Procedure: PERIPHERAL VASCULAR THROMBECTOMY;  Surgeon: Algernon Huxley, MD;  Location: [place] [facility];  Service: Cardiovascular;  Laterality: Right;  . PORTA CATH INSERTION N/A 08/06/2018   Procedure: PORTA CATH INSERTION;  Surgeon: Algernon Huxley, MD;  Location: [place] [facility];  Service: Cardiovascular;  Laterality: N/A;  . TRANSURETHRAL RESECTION OF BLADDER TUMOR N/A 07/16/2018   Procedure: TRANSURETHRAL RESECTION OF BLADDER TUMOR (TURBT);  Surgeon: Billey Co, MD;  Location: [facility];  Service: Urology;  Laterality: N/A;    SOCIAL HISTORY: Social History   Socioeconomic History  . Marital status: Single    Spouse name: Not on file  . Number of children: 1  . Years of education: Not on file  . Highest education level: 10th grade  Occupational History  . Not on file  Tobacco Use  . Smoking status: Former Smoker    Quit date: 06/05/1991    Years since quitting: 28.7  . Smokeless tobacco: Never Used  Vaping Use  . Vaping Use: Never used  Substance and Sexual Activity  . Alcohol use: No  . Drug use: No  . Sexual activity: Never  Other Topics Concern  . Not on file  Social History Narrative   Working full time   Social Determinants of Radio broadcast assistant Strain:   . Difficulty of Paying Living Expenses: Not on file  Food Insecurity:   . Worried About Charity fundraiser in the Last Year: Not on file  . Ran Out of Food in the Last Year: Not on file  Transportation Needs:   . Lack of Transportation (Medical): Not on file  . Lack of Transportation (Non-Medical): Not on file  Physical Activity:   . Days of Exercise per Week: Not on file  . Minutes of Exercise per Session: Not on file  Stress:   . Feeling of Stress : Not on file  Social Connections:   . Frequency of Communication with Friends and Family: Not on file  . Frequency of Social Gatherings with Friends and Family: Not on file  .  Attends Religious Services: Not on file  . Active Member of Clubs or Organizations: Not on file  . Attends Archivist Meetings: Not on file  . Marital Status: Not on file  Intimate Partner Violence:   . Fear of Current or Ex-Partner: Not on file  . Emotionally Abused: Not on file  . Physically Abused: Not on file  . Sexually Abused: Not on file    FAMILY HISTORY: Family History  Problem Relation Age of Onset  . Heart disease Mother   . Heart attack Mother   . Arthritis Father   . Diabetes Brother     ALLERGIES:  has No Known Allergies.  MEDICATIONS:  Current Outpatient Medications  Medication Sig Dispense Refill  . amLODipine (NORVASC) 5 MG tablet Take 1 tablet (5 mg total) by mouth daily. 90 tablet 1  . diclofenac sodium (VOLTAREN) 1 % GEL Apply 2 g topically 4 (four) times daily. 100 g 2  . docusate sodium (COLACE) 100 MG capsule Take 1 capsule (100 mg total) by mouth daily. 30 capsule 1  . lidocaine-prilocaine (EMLA) cream Apply to affected area  once 30 g 3  . polyethylene glycol (MIRALAX / GLYCOLAX) 17 g packet Take 17 g by mouth daily as needed for mild constipation. 14 each 0  . rosuvastatin (CRESTOR) 40 MG tablet Take 1 tablet (40 mg total) by mouth daily. 90 tablet 1  . senna-docusate (SENOKOT-S) 8.6-50 MG tablet Take 1 tablet by mouth 2 (two) times daily. OK to hold or decrease to 1x per day if diarrhea 30 tablet 3   No current facility-administered medications for this visit.   Facility-Administered Medications Ordered in Other Visits  Medication Dose Route Frequency Provider Last Rate Last Admin  . sodium chloride flush (NS) 0.9 % injection 10 mL  10 mL Intravenous PRN [name], MD   10 mL at 03/01/20 1301     PHYSICAL EXAMINATION: ECOG PERFORMANCE STATUS: 1 - Symptomatic but completely ambulatory Vitals:   03/01/20 1344  BP: (!) 154/77  Pulse: 82  Resp: 18  Temp: (!) 96.9 F (36.1 C)   Filed Weights   03/01/20 1344  Weight: 200 lb 4.8 oz  (90.9 kg)    Physical Exam Constitutional:      General: She is not in acute distress.    Comments: Walk independently  HENT:     Head: Normocephalic and atraumatic.  Eyes:     General: No scleral icterus.    Pupils: Pupils are equal, round, and reactive to light.  Cardiovascular:     Rate and Rhythm: Normal rate and regular rhythm.     Heart sounds: Normal heart sounds.  Pulmonary:     Effort: Pulmonary effort is normal. No respiratory distress.     Breath sounds: No wheezing.  Abdominal:     General: Bowel sounds are normal. There is no distension.     Palpations: Abdomen is soft. There is no mass.  Musculoskeletal:        General: No deformity. Normal range of motion.     Cervical back: Normal range of motion and neck supple.  Skin:    General: Skin is warm and dry.     Coloration: Skin is not pale.     Findings: No erythema or rash.     Comments: Ureterostomy, with light yellow color urine in the ostomy bag.  Neurological:     Mental Status: She is alert and oriented to person, place, and time. Mental status is at baseline.     Cranial Nerves: No cranial nerve deficit.     Coordination: Coordination normal.  Psychiatric:        Mood and Affect: Mood normal.     RADIOGRAPHIC STUDIES: I have personally reviewed the radiological images as listed and agreed with the findings in the report.  CMP Latest Ref Rng & Units 03/01/2020  Glucose 70 - 99 mg/dL 190(H)  BUN 8 - 23 mg/dL 33(H)  Creatinine 0.44 - 1.00 mg/dL 2.08(H)  Sodium 135 - 145 mmol/L 140  Potassium 3.5 - 5.1 mmol/L 4.6  Chloride 98 - 111 mmol/L 108  CO2 22 - 32 mmol/L 22  Calcium 8.9 - 10.3 mg/dL 9.3  Total Protein 6.5 - 8.1 g/dL 7.8  Total Bilirubin 0.3 - 1.2 mg/dL 0.5  Alkaline Phos 38 - 126 U/L 87  AST 15 - 41 U/L 23  ALT 0 - 44 U/L 13   CBC Latest Ref Rng & Units 03/01/2020  WBC 4.0 - 10.5 K/uL 5.5  Hemoglobin 12.0 - 15.0 g/dL 9.7(L)  Hematocrit 36 - 46 % 30.7(L)  Platelets 150 - 400 K/uL  230     LABORATORY DATA:  I have reviewed the data as listed Lab Results  Component Value Date   WBC 5.5 03/01/2020   HGB 9.7 (L) 03/01/2020   HCT 30.7 (L) 03/01/2020   MCV 76.8 (L) 03/01/2020   PLT 230 03/01/2020   Recent Labs    02/09/20 0832 02/16/20 1307 03/01/20 1301  NA 138 139 140  K 4.4 4.6 4.6  CL 104 107 108  CO2 21* 20* 22  GLUCOSE 228* 163* 190*  BUN 41* 49* 33*  CREATININE 2.35* 2.41* 2.08*  CALCIUM 9.0 9.3 9.3  GFRNONAA 20* 20* 24*  GFRAA 24* 23* 27*  PROT 7.9 8.6* 7.8  ALBUMIN 4.3 4.5 4.3  AST $Re'27 22 23  'Gwi$ ALT $R'14 14 13  'Np$ ALKPHOS 100 86 87  BILITOT 0.6 0.5 0.5   Iron/TIBC/Ferritin/ %Sat    Component Value Date/Time   IRON 73 11/17/2019 0936   IRON 26 (L) 02/04/2019 1309   TIBC 316 11/17/2019 0936   TIBC 251 02/04/2019 1309   FERRITIN 256 11/17/2019 0936   FERRITIN 409 (H) 02/04/2019 1309   IRONPCTSAT 23 11/17/2019 0936   IRONPCTSAT 10 (L) 02/04/2019 1309    RADIOGRAPHIC STUDIES: I have personally reviewed the radiological images as listed and agreed with the findings in the report. CT Abdomen Pelvis Wo Contrast  Result Date: 01/06/2020 CLINICAL DATA:  Restaging metastatic bladder cancer. History of cystectomy and neobladder surgery in April 2020. EXAM: CT CHEST, ABDOMEN AND PELVIS WITHOUT CONTRAST TECHNIQUE: Multidetector CT imaging of the chest, abdomen and pelvis was performed following the standard protocol without IV contrast. COMPARISON:  PET-CT 09/01/2019 FINDINGS: CT CHEST FINDINGS Cardiovascular: The heart is within normal limits in size and stable. No pericardial effusion. Stable aortic calcifications but no aneurysm. Stable three-vessel coronary artery calcifications. Mediastinum/Nodes: Small scattered mediastinal and hilar lymph nodes are stable. No mass or overt adenopathy the. The esophagus is grossly normal. The thyroid gland is unremarkable. Lungs/Pleura: Stable emphysematous changes and areas of pulmonary scarring. No worrisome pulmonary nodules  to suggest pulmonary metastatic disease. No pleural effusions or pleural lesions. Stable pericardial cyst near the right inferior pulmonary vein. Musculoskeletal: No significant bony findings. CT ABDOMEN PELVIS FINDINGS Hepatobiliary: No hepatic lesions are identified without contrast. The gallbladder is unremarkable. No common bile duct dilatation. Pancreas: No mass, inflammation or ductal dilatation. Spleen: Normal size.  No focal lesions. Adrenals/Urinary Tract: The adrenal glands and kidneys are unremarkable and stable. No worrisome renal lesions or hydronephrosis. Stable surgical changes from a urinary diversion procedure with an ileal conduit. No complicating features are identified. Status post cystectomy. Stomach/Bowel: The stomach, duodenum, small bowel and colon are unremarkable. No acute inflammatory changes, mass lesions or obstructive findings. The terminal ileum is normal. The appendix is normal. Vascular/Lymphatic: Stable advanced atherosclerotic calcifications involving the aorta and branch vessels. Right iliac artery stent is noted. No mesenteric, retroperitoneal or pelvic lymphadenopathy. A few small stable scattered lobe mesenteric lymph nodes are noted. The 6 mm nodule on image 81/2 is unchanged. Reproductive: Surgically absent. Other: Small amount of free pelvic fluid is noted. No pelvic or inguinal adenopathy. Musculoskeletal: No significant bony findings. Moderate lower lumbar facet disease, SI joint degenerative changes and hip joint degenerative changes. IMPRESSION: 1. Stable surgical changes from a urinary diversion procedure with an ileal conduit. No complicating features are identified. 2. No findings for metastatic disease involving the chest, abdomen or pelvis. 3. Stable emphysematous changes and pulmonary scarring. 4. Stable advanced atherosclerotic calcifications involving the thoracic  and abdominal aorta and branch vessels including the coronary arteries. 5. Emphysema and aortic  atherosclerosis. Aortic Atherosclerosis (ICD10-I70.0) and Emphysema (ICD10-J43.9). Electronically Signed   By: Rudie Meyer M.D.   On: 01/06/2020 11:48   CT Chest Wo Contrast  Result Date: 01/06/2020 CLINICAL DATA:  Restaging metastatic bladder cancer. History of cystectomy and neobladder surgery in April 2020. EXAM: CT CHEST, ABDOMEN AND PELVIS WITHOUT CONTRAST TECHNIQUE: Multidetector CT imaging of the chest, abdomen and pelvis was performed following the standard protocol without IV contrast. COMPARISON:  PET-CT 09/01/2019 FINDINGS: CT CHEST FINDINGS Cardiovascular: The heart is within normal limits in size and stable. No pericardial effusion. Stable aortic calcifications but no aneurysm. Stable three-vessel coronary artery calcifications. Mediastinum/Nodes: Small scattered mediastinal and hilar lymph nodes are stable. No mass or overt adenopathy the. The esophagus is grossly normal. The thyroid gland is unremarkable. Lungs/Pleura: Stable emphysematous changes and areas of pulmonary scarring. No worrisome pulmonary nodules to suggest pulmonary metastatic disease. No pleural effusions or pleural lesions. Stable pericardial cyst near the right inferior pulmonary vein. Musculoskeletal: No significant bony findings. CT ABDOMEN PELVIS FINDINGS Hepatobiliary: No hepatic lesions are identified without contrast. The gallbladder is unremarkable. No common bile duct dilatation. Pancreas: No mass, inflammation or ductal dilatation. Spleen: Normal size.  No focal lesions. Adrenals/Urinary Tract: The adrenal glands and kidneys are unremarkable and stable. No worrisome renal lesions or hydronephrosis. Stable surgical changes from a urinary diversion procedure with an ileal conduit. No complicating features are identified. Status post cystectomy. Stomach/Bowel: The stomach, duodenum, small bowel and colon are unremarkable. No acute inflammatory changes, mass lesions or obstructive findings. The terminal ileum is normal. The  appendix is normal. Vascular/Lymphatic: Stable advanced atherosclerotic calcifications involving the aorta and branch vessels. Right iliac artery stent is noted. No mesenteric, retroperitoneal or pelvic lymphadenopathy. A few small stable scattered lobe mesenteric lymph nodes are noted. The 6 mm nodule on image 81/2 is unchanged. Reproductive: Surgically absent. Other: Small amount of free pelvic fluid is noted. No pelvic or inguinal adenopathy. Musculoskeletal: No significant bony findings. Moderate lower lumbar facet disease, SI joint degenerative changes and hip joint degenerative changes. IMPRESSION: 1. Stable surgical changes from a urinary diversion procedure with an ileal conduit. No complicating features are identified. 2. No findings for metastatic disease involving the chest, abdomen or pelvis. 3. Stable emphysematous changes and pulmonary scarring. 4. Stable advanced atherosclerotic calcifications involving the thoracic and abdominal aorta and branch vessels including the coronary arteries. 5. Emphysema and aortic atherosclerosis. Aortic Atherosclerosis (ICD10-I70.0) and Emphysema (ICD10-J43.9). Electronically Signed   By: Rudie Meyer M.D.   On: 01/06/2020 11:48   US RENAL  Result Date: 02/21/2020 CLINICAL DATA:  Elevated creatinine EXAM: RENAL / URINARY TRACT ULTRASOUND COMPLETE COMPARISON:  None. FINDINGS: Right Kidney: Renal measurements: 8.8 x 4.7 x 4.3 cm = volume: 94 mL. The renal cortex is slightly echogenic. No shadowing stones are seen. No mass or hydronephrosis visualized. Left Kidney: Renal measurements: 8.8 x 5.4 x 4.3 cm = volume: 107 mL. The renal cortex is slightly echogenic. No shadowing stones are seen. No mass or hydronephrosis visualized. Bladder: Surgically removed Other: None. IMPRESSION: Diffusely increased parenchymal echogenicity, consistent with medical renal disease. Electronically Signed   By: Jonna Clark M.D.   On: 02/21/2020 15:06     ASSESSMENT & PLAN:  1.  Metastatic urothelial carcinoma (HCC)   2. Anemia in stage 4 chronic kidney disease (HCC)   3. Encounter for antineoplastic immunotherapy   4. History of DVT of lower  extremity   Cancer Staging Bladder carcinoma [facility]) Staging form: Urinary Bladder, AJCC 8th Edition - Clinical stage from 08/01/2018: Stage IVA (cTX, cN3, cM1a) - Signed by [name], MD on 04/21/2019  #Metastatic urothelial carcinoma of bladder, sarcomatoid features pelvic sidewall mass showed metastatic carcinoma consistent with involvement by urothelial carcinoma. Labs are reviewed and discussed with patient. Counts acceptable to proceed with Keytruda every 3 weeks.  Patient has questions about the treatment and I discussed with her in details about the recommended interval of Keytruda. Recent PET scan showed no metastatic disease.  She is currently stage IV urothelial carcinoma NED.  I recommend to continue immunotherapy if she tolerates.  #Acute on chronic kidney failure, Recommend patient to continue stable from any NSAIDs.  Improve oral hydration. Creatinine has improved. Patient has an appointment to establish care with nephrology. US kidney results were reviewed and discussed with patient.  Consistent with medical renal disease.  #Anemia in stage III chronic kidney disease, hemoglobin is 9.8.  Patient has microcytosis secondary to alpha thalassemia trait. Stable counts continue to monitor.  Patient declines IV iron treatments.  #Right lower extremity  femoral vein DVT provoked by transvenous biopsy in the context of cancer recurrence, Status post IV C filter and mechanical thrombectomy.  IVC filter has been removed. She is currently off anticoagulation due to hematuria.  #Uncontrolled diabetes, glucose levels persistently close to 200.  I recommend patient to call primary care provider for further management.  I sent patient's primary care provider secure chat message   #Follow-up in 3 weeks for reevaluation of  resuming immunotherapy. All questions were answered. The patient knows to call the clinic with any problems questions or concerns.  [name], MD, PhD Hematology Oncology [facility] Pager- [phone] 03/01/20

## 2020-03-01 NOTE — Progress Notes (Signed)
Cr 2.08 ok to proceed per md

## 2020-03-09 DIAGNOSIS — Z7984 Long term (current) use of oral hypoglycemic drugs: Secondary | ICD-10-CM | POA: Diagnosis not present

## 2020-03-09 DIAGNOSIS — E119 Type 2 diabetes mellitus without complications: Secondary | ICD-10-CM | POA: Diagnosis not present

## 2020-03-09 DIAGNOSIS — H52223 Regular astigmatism, bilateral: Secondary | ICD-10-CM | POA: Diagnosis not present

## 2020-03-09 DIAGNOSIS — H5203 Hypermetropia, bilateral: Secondary | ICD-10-CM | POA: Diagnosis not present

## 2020-03-09 DIAGNOSIS — H2513 Age-related nuclear cataract, bilateral: Secondary | ICD-10-CM | POA: Diagnosis not present

## 2020-03-09 DIAGNOSIS — H524 Presbyopia: Secondary | ICD-10-CM | POA: Diagnosis not present

## 2020-03-10 DIAGNOSIS — E1122 Type 2 diabetes mellitus with diabetic chronic kidney disease: Secondary | ICD-10-CM | POA: Diagnosis not present

## 2020-03-10 DIAGNOSIS — N1832 Chronic kidney disease, stage 3b: Secondary | ICD-10-CM | POA: Diagnosis not present

## 2020-03-10 DIAGNOSIS — I10 Essential (primary) hypertension: Secondary | ICD-10-CM | POA: Diagnosis not present

## 2020-03-10 DIAGNOSIS — N179 Acute kidney failure, unspecified: Secondary | ICD-10-CM | POA: Diagnosis not present

## 2020-03-11 ENCOUNTER — Ambulatory Visit (INDEPENDENT_AMBULATORY_CARE_PROVIDER_SITE_OTHER): Payer: Medicare Other | Admitting: Nurse Practitioner

## 2020-03-11 ENCOUNTER — Encounter: Payer: Self-pay | Admitting: Nurse Practitioner

## 2020-03-11 ENCOUNTER — Other Ambulatory Visit: Payer: Self-pay

## 2020-03-11 VITALS — BP 137/75 | HR 96 | Temp 98.0°F | Ht 63.0 in | Wt 202.0 lb

## 2020-03-11 DIAGNOSIS — E118 Type 2 diabetes mellitus with unspecified complications: Secondary | ICD-10-CM

## 2020-03-11 DIAGNOSIS — Z23 Encounter for immunization: Secondary | ICD-10-CM | POA: Diagnosis not present

## 2020-03-11 DIAGNOSIS — E782 Mixed hyperlipidemia: Secondary | ICD-10-CM

## 2020-03-11 DIAGNOSIS — E1159 Type 2 diabetes mellitus with other circulatory complications: Secondary | ICD-10-CM

## 2020-03-11 DIAGNOSIS — I152 Hypertension secondary to endocrine disorders: Secondary | ICD-10-CM | POA: Diagnosis not present

## 2020-03-11 MED ORDER — AMLODIPINE BESYLATE 5 MG PO TABS: 5.0000 mg | ORAL_TABLET | Freq: Every day | ORAL | 1 refills | Status: DC

## 2020-03-11 MED ORDER — ROSUVASTATIN CALCIUM 40 MG PO TABS: 40.0000 mg | ORAL_TABLET | Freq: Every day | ORAL | 1 refills | Status: DC

## 2020-03-11 NOTE — Assessment & Plan Note (Addendum)
Chronic, ongoing.  Blood pressure very slightly above goal in clinic today but reports blood pressures at goal at home.  We will continue amlodipine 5 mg daily for now for blood pressure.  Encourage patient to notify clinic if blood pressure runs greater than 130/80.  A1c and urine microalbumin checked today.  Recent CMP and CBC with oncologist showed levels are stable.  Follow-up in 3 months.

## 2020-03-11 NOTE — Progress Notes (Signed)
BP 137/75 (BP Location: Right Arm, Patient Position: Sitting)   Pulse 96   Temp 98 F (36.7 C) (Oral)   Ht 5\' 3"  (1.6 m)   Wt 202 lb (91.6 kg)   SpO2 99%   BMI 35.78 kg/m    Subjective:    Patient ID: Angela Russell, female    DOB: [birth year], 69 y.o.   MRN: [id number]  HPI: Angela Russell is a 69 y.o. female presenting for chronic diease follow up.  Chief Complaint  Patient presents with  . Diabetes    needs foot exam   . [id number]    wants vaccine   DIABETES Not currently taking anything for diabetes, reports has been diet controlled for years. Hypoglycemic episodes:no Polydipsia/polyuria: no Visual disturbance: no Chest pain: no Paresthesias: no  Glucose Monitoring: no  Accucheck frequency: not checking Taking Insulin?: no Blood Pressure Monitoring: not checking Retinal Examination: Up to Date Foot Exam: Up to Date Diabetic Education: Completed Pneumovax: Up to Date Influenza: Up to Date Aspirin: no  HYPERTENSION / HYPERLIPIDEMIA Currently taking amlodipine 5 mg daily for blood pressure and Crestor 40 mg daily for cholesterol. Satisfied with current treatment? yes Duration of hypertension: chronic BP monitoring frequency: not checking BP range: 130/70 BP medication side effects: no Past BP meds:  Amlodipine 5  Mg daily Duration of hyperlipidemia: chronic Cholesterol medication side effects: no Cholesterol supplements: none Past cholesterol medications: rosuvastatin 40 mg daily Medication compliance: excellent compliance Aspirin: no Recent stressors: no Recurrent headaches: no Visual changes: no Palpitations: no Dyspnea: no Chest pain: no Lower extremity edema: no Dizzy/lightheaded: no  No Known Allergies  Outpatient Encounter Medications as of 03/11/2020  Medication Sig  . amLODipine (NORVASC) 5 MG tablet Take 1 tablet (5 mg total) by mouth daily.  . diclofenac sodium (VOLTAREN) 1 % GEL Apply 2 g topically 4 (four) times daily. (Patient  taking differently: Apply 2 g topically as needed. )  . polyethylene glycol (MIRALAX / GLYCOLAX) 17 g packet Take 17 g by mouth daily as needed for mild constipation.  . rosuvastatin (CRESTOR) 40 MG tablet Take 1 tablet (40 mg total) by mouth daily.  . [DISCONTINUED] amLODipine (NORVASC) 5 MG tablet Take 1 tablet (5 mg total) by mouth daily.  . [DISCONTINUED] rosuvastatin (CRESTOR) 40 MG tablet Take 1 tablet (40 mg total) by mouth daily.  . [DISCONTINUED] docusate sodium (COLACE) 100 MG capsule Take 1 capsule (100 mg total) by mouth daily.  . [DISCONTINUED] lidocaine-prilocaine (EMLA) cream Apply to affected area once (Patient not taking: Reported on 03/11/2020)  . [DISCONTINUED] prochlorperazine (COMPAZINE) 10 MG tablet Take 1 tablet (10 mg total) by mouth every 6 (six) hours as needed (Nausea or vomiting).  . [DISCONTINUED] senna-docusate (SENOKOT-S) 8.6-50 MG tablet Take 1 tablet by mouth 2 (two) times daily. OK to hold or decrease to 1x per day if diarrhea (Patient not taking: Reported on 03/11/2020)   No facility-administered encounter medications on file as of 03/11/2020.   Patient Active Problem List   Diagnosis Date Noted  . Rib pain on left side 08/05/2019  . Chronic anticoagulation 08/05/2019  . Anemia in stage 3a chronic kidney disease ([place]) 08/05/2019  . Encounter for antineoplastic immunotherapy 08/05/2019  . History of DVT of lower extremity 06/26/2019  . Acute deep vein thrombosis (DVT) of femoral vein of right lower extremity ([place]) 03/31/2019  . DVT (deep venous thrombosis) ([place]) 03/27/2019  . Metastatic urothelial carcinoma ([place]) 03/23/2019  . Vaginal pain 01/27/2019  . Neuropathic pain  01/27/2019  . Hyperkalemia 12/19/2018  . Fluid collection at surgical site 10/17/2018  . Tachycardia 10/15/2018  . Pyelonephritis 10/14/2018  . Iron deficiency anemia due to chronic blood loss 08/26/2018  . AKI (acute kidney injury) ([place]) 08/06/2018  . Bladder carcinoma ([place]) 08/03/2018  .  Goals of care, counseling/discussion 08/03/2018  . Bladder tumor 07/16/2018  . Hyperlipidemia   . Hypertension associated with diabetes ([place])   . Controlled diabetes mellitus type 2 with complications ([place])   . Renal insufficiency   . CKD (chronic kidney disease)   . Hypochromic microcytic anemia   . Chronic anemia 07/24/2016  . Carotid artery plaque, right 01/02/2014  . Cervical facet joint syndrome 03/20/2013  . Spondylolisthesis of lumbar region 03/20/2013   Past Medical History:  Diagnosis Date  . Carotid artery plaque, right 01/2014  . CKD (chronic kidney disease)    stage 2-3  . Diabetes mellitus without complication ([place])   . DM (diabetes mellitus), type 2, uncontrolled ([place])   . Hyperlipidemia   . Hypertension   . Hypochromic microcytic anemia    mild  . Metastatic urothelial carcinoma ([place]) 03/23/2019  . Osteoporosis   . Renal insufficiency   . Rotator cuff tendonitis, right    Relevant past medical, surgical, family and social history reviewed and updated as indicated. Interim medical history since our last visit reviewed.  Review of Systems  Constitutional: Negative.  Negative for activity change, appetite change, diaphoresis, fatigue and fever.  Eyes: Negative.  Negative for visual disturbance.  Respiratory: Negative.  Negative for cough, chest tightness, shortness of breath and wheezing.   Cardiovascular: Negative.  Negative for chest pain, palpitations and leg swelling.  Gastrointestinal: Negative.  Negative for abdominal pain, constipation, diarrhea, nausea and vomiting.  Musculoskeletal: Negative.   Skin: Negative.  Negative for rash.  Neurological: Negative.  Negative for dizziness, light-headedness, numbness and headaches.  Psychiatric/Behavioral: Negative.  Negative for confusion and decreased concentration. The patient is not nervous/anxious.     Per HPI unless specifically indicated above     Objective:    BP 137/75 (BP Location: Right Arm, Patient  Position: Sitting)   Pulse 96   Temp 98 F (36.7 C) (Oral)   Ht 5\' 3"  (1.6 m)   Wt 202 lb (91.6 kg)   SpO2 99%   BMI 35.78 kg/m   Wt Readings from Last 3 Encounters:  03/11/20 202 lb (91.6 kg)  03/01/20 200 lb 4.8 oz (90.9 kg)  02/16/20 202 lb 9.6 oz (91.9 kg)    Physical Exam Vitals and nursing note reviewed.  Constitutional:      General: She is not in acute distress.    Appearance: Normal appearance. She is obese. She is not toxic-appearing.  HENT:     Head: Normocephalic and atraumatic.     Right Ear: External ear normal.     Left Ear: External ear normal.  Eyes:     General: No scleral icterus.    Extraocular Movements: Extraocular movements intact.  Neck:     Vascular: No carotid bruit.  Cardiovascular:     Rate and Rhythm: Normal rate and regular rhythm.     Heart sounds: Normal heart sounds. No murmur heard.   Pulmonary:     Effort: Pulmonary effort is normal. No respiratory distress.     Breath sounds: Normal breath sounds. No wheezing, rhonchi or rales.  Abdominal:     General: Abdomen is flat. Bowel sounds are normal.     Palpations: Abdomen is soft.  Tenderness: There is no abdominal tenderness.  Musculoskeletal:        General: No swelling or tenderness. Normal range of motion.     Cervical back: Normal range of motion.  Skin:    General: Skin is warm and dry.     Capillary Refill: Capillary refill takes less than 2 seconds.     Coloration: Skin is not jaundiced.     Findings: No bruising.  Neurological:     General: No focal deficit present.     Mental Status: She is alert and oriented to person, place, and time.     Motor: No weakness.     Gait: Gait normal.  Psychiatric:        Mood and Affect: Mood normal.        Behavior: Behavior normal.        Thought Content: Thought content normal.        Judgment: Judgment normal.       Assessment & Plan:   Problem List Items Addressed This Visit      Cardiovascular and Mediastinum    Hypertension associated with diabetes ([place]) - Primary    Chronic, ongoing.  Blood pressure very slightly above goal in clinic today but reports blood pressures at goal at home.  We will continue amlodipine 5 mg daily for now for blood pressure.  Encourage patient to notify clinic if blood pressure runs greater than 130/80.  A1c and urine microalbumin checked today.  Recent CMP and CBC with oncologist showed levels are stable.  Follow-up in 3 months.      Relevant Medications   rosuvastatin (CRESTOR) 40 MG tablet   amLODipine (NORVASC) 5 MG tablet     Endocrine   Controlled diabetes mellitus type 2 with complications ([facility])    Chronic, ongoing.  Patient not checking blood sugar at home and is trying to control diabetes with diet.  Will check urine microalbumin and A1c today, goal A1c less than 7%.  Discussed carbohydrate counting.  Foot exam today and normal.  Up-to-date on eye exam. recent CMP and CBC with oncology showed stable levels.  Follow-up in 3 months.      Relevant Medications   rosuvastatin (CRESTOR) 40 MG tablet   Other Relevant Orders   HgB A1c (Completed)   Microalbumin, Urine Waived (Completed)     Other   Hyperlipidemia    Chronic, stable.  Cholesterol levels checked today.  We will continue rosuvastatin 40 mg daily for now, refills given.  Follow-up in 3 months.      Relevant Medications   rosuvastatin (CRESTOR) 40 MG tablet   amLODipine (NORVASC) 5 MG tablet   Other Relevant Orders   Lipid Panel w/o Chol/HDL Ratio (Completed)    Other Visit Diagnoses    Need for immunization against influenza       Relevant Orders   Flu Vaccine QUAD High Dose(Fluad) (Completed)       Follow up plan: Return in about 3 months (around 06/11/2020).

## 2020-03-12 LAB — LIPID PANEL W/O CHOL/HDL RATIO
Cholesterol, Total: 133 mg/dL (ref 100–199)
HDL: 60 mg/dL (ref >39)
LDL Chol Calc (NIH): 55 mg/dL (ref 0–99)
Triglycerides: 97 mg/dL (ref 0–149)
VLDL Cholesterol Cal: 18 mg/dL (ref 5–40)

## 2020-03-12 LAB — MICROALBUMIN, URINE WAIVED
Creatinine, Urine Waived: 100 mg/dL (ref 10–300)
Microalb, Ur Waived: 150 mg/L — ABNORMAL HIGH (ref 0–19)
Microalb/Creat Ratio: >300 mg/g — ABNORMAL HIGH (ref <30)

## 2020-03-12 LAB — HEMOGLOBIN A1C
Est. average glucose Bld gHb Est-mCnc: 171 mg/dL
Hgb A1c MFr Bld: 7.6 % — ABNORMAL HIGH (ref 4.8–5.6)

## 2020-03-12 NOTE — Assessment & Plan Note (Signed)
Chronic, stable.  Cholesterol levels checked today.  We will continue rosuvastatin 40 mg daily for now, refills given.  Follow-up in 3 months.

## 2020-03-12 NOTE — Assessment & Plan Note (Addendum)
Chronic, ongoing.  Patient not checking blood sugar at home and is trying to control diabetes with diet.  Will check urine microalbumin and A1c today, goal A1c less than 7%.  Discussed carbohydrate counting.  Foot exam today and normal.  Up-to-date on eye exam. recent CMP and CBC with oncology showed stable levels.  Follow-up in 3 months.

## 2020-03-15 ENCOUNTER — Telehealth: Payer: Self-pay

## 2020-03-15 MED ORDER — LOSARTAN POTASSIUM 25 MG PO TABS: 25.0000 mg | ORAL_TABLET | Freq: Every day | ORAL | 0 refills | Status: DC

## 2020-03-15 NOTE — Telephone Encounter (Signed)
erroneous entry 

## 2020-03-15 NOTE — Progress Notes (Signed)
Losartan sent to pharmacy.  Please have her make 1 month f/u appt to follow up on medication

## 2020-03-15 NOTE — Addendum Note (Signed)
Addended by: Noemi Chapel A on: 03/15/2020 11:28 AM   Modules accepted: Orders

## 2020-03-22 ENCOUNTER — Inpatient Hospital Stay: Payer: Medicare Other | Attending: Oncology

## 2020-03-22 ENCOUNTER — Other Ambulatory Visit: Payer: Self-pay

## 2020-03-22 ENCOUNTER — Encounter: Payer: Self-pay | Admitting: Oncology

## 2020-03-22 ENCOUNTER — Inpatient Hospital Stay: Payer: Medicare Other

## 2020-03-22 ENCOUNTER — Inpatient Hospital Stay (HOSPITAL_BASED_OUTPATIENT_CLINIC_OR_DEPARTMENT_OTHER): Payer: Medicare Other | Admitting: Oncology

## 2020-03-22 VITALS — BP 139/78 | HR 80 | Temp 97.6°F | Resp 16 | Wt 204.6 lb

## 2020-03-22 DIAGNOSIS — D631 Anemia in chronic kidney disease: Secondary | ICD-10-CM

## 2020-03-22 DIAGNOSIS — K5900 Constipation, unspecified: Secondary | ICD-10-CM | POA: Diagnosis not present

## 2020-03-22 DIAGNOSIS — I129 Hypertensive chronic kidney disease with stage 1 through stage 4 chronic kidney disease, or unspecified chronic kidney disease: Secondary | ICD-10-CM | POA: Diagnosis not present

## 2020-03-22 DIAGNOSIS — Z79899 Other long term (current) drug therapy: Secondary | ICD-10-CM | POA: Diagnosis not present

## 2020-03-22 DIAGNOSIS — E1165 Type 2 diabetes mellitus with hyperglycemia: Secondary | ICD-10-CM | POA: Diagnosis not present

## 2020-03-22 DIAGNOSIS — N184 Chronic kidney disease, stage 4 (severe): Secondary | ICD-10-CM | POA: Diagnosis not present

## 2020-03-22 DIAGNOSIS — C679 Malignant neoplasm of bladder, unspecified: Secondary | ICD-10-CM | POA: Insufficient documentation

## 2020-03-22 DIAGNOSIS — Z86718 Personal history of other venous thrombosis and embolism: Secondary | ICD-10-CM | POA: Diagnosis not present

## 2020-03-22 DIAGNOSIS — C7910 Secondary malignant neoplasm of unspecified urinary organs: Secondary | ICD-10-CM

## 2020-03-22 DIAGNOSIS — N1832 Chronic kidney disease, stage 3b: Secondary | ICD-10-CM | POA: Diagnosis not present

## 2020-03-22 DIAGNOSIS — Z7901 Long term (current) use of anticoagulants: Secondary | ICD-10-CM | POA: Diagnosis not present

## 2020-03-22 DIAGNOSIS — Z5112 Encounter for antineoplastic immunotherapy: Secondary | ICD-10-CM | POA: Insufficient documentation

## 2020-03-22 LAB — CBC WITH DIFFERENTIAL/PLATELET
Abs Immature Granulocytes: 0.03 10*3/uL (ref 0.00–0.07)
Basophils Absolute: 0 10*3/uL (ref 0.0–0.1)
Basophils Relative: 1 %
Eosinophils Absolute: 0.2 10*3/uL (ref 0.0–0.5)
Eosinophils Relative: 3 %
HCT: 29.8 % — ABNORMAL LOW (ref 36.0–46.0)
Hemoglobin: 9.3 g/dL — ABNORMAL LOW (ref 12.0–15.0)
Immature Granulocytes: 1 %
Lymphocytes Relative: 23 %
Lymphs Abs: 1.3 10*3/uL (ref 0.7–4.0)
MCH: 24.2 pg — ABNORMAL LOW (ref 26.0–34.0)
MCHC: 31.2 g/dL (ref 30.0–36.0)
MCV: 77.6 fL — ABNORMAL LOW (ref 80.0–100.0)
Monocytes Absolute: 0.5 10*3/uL (ref 0.1–1.0)
Monocytes Relative: 8 %
Neutro Abs: 3.6 10*3/uL (ref 1.7–7.7)
Neutrophils Relative %: 64 %
Platelets: 223 10*3/uL (ref 150–400)
RBC: 3.84 MIL/uL — ABNORMAL LOW (ref 3.87–5.11)
RDW: 13.2 % (ref 11.5–15.5)
WBC: 5.6 10*3/uL (ref 4.0–10.5)
nRBC: 0 % (ref 0.0–0.2)

## 2020-03-22 LAB — COMPREHENSIVE METABOLIC PANEL
ALT: 11 U/L (ref 0–44)
AST: 19 U/L (ref 15–41)
Albumin: 4 g/dL (ref 3.5–5.0)
Alkaline Phosphatase: 93 U/L (ref 38–126)
Anion gap: 9 (ref 5–15)
BUN: 31 mg/dL — ABNORMAL HIGH (ref 8–23)
CO2: 22 mmol/L (ref 22–32)
Calcium: 8.9 mg/dL (ref 8.9–10.3)
Chloride: 107 mmol/L (ref 98–111)
Creatinine, Ser: 1.66 mg/dL — ABNORMAL HIGH (ref 0.44–1.00)
GFR, Estimated: 31 mL/min — ABNORMAL LOW (ref >60)
Glucose, Bld: 163 mg/dL — ABNORMAL HIGH (ref 70–99)
Potassium: 4 mmol/L (ref 3.5–5.1)
Sodium: 138 mmol/L (ref 135–145)
Total Bilirubin: 0.6 mg/dL (ref 0.3–1.2)
Total Protein: 7.5 g/dL (ref 6.5–8.1)

## 2020-03-22 LAB — TSH: TSH: 2.671 u[IU]/mL (ref 0.350–4.500)

## 2020-03-22 MED ORDER — SODIUM CHLORIDE 0.9 % IV SOLN
200.0000 mg | Freq: Once | INTRAVENOUS | Status: AC
  Administered 2020-03-22: 200 mg via INTRAVENOUS
  Filled 2020-03-22: qty 8

## 2020-03-22 MED ORDER — HEPARIN SOD (PORK) LOCK FLUSH 100 UNIT/ML IV SOLN
INTRAVENOUS | Status: AC
  Filled 2020-03-22: qty 5

## 2020-03-22 MED ORDER — HEPARIN SOD (PORK) LOCK FLUSH 100 UNIT/ML IV SOLN
500.0000 [IU] | Freq: Once | INTRAVENOUS | Status: DC | PRN
  Filled 2020-03-22: qty 5

## 2020-03-22 MED ORDER — SODIUM CHLORIDE 0.9 % IV SOLN
Freq: Once | INTRAVENOUS | Status: AC
  Filled 2020-03-22: qty 250

## 2020-03-22 MED ORDER — DOCUSATE SODIUM 100 MG PO CAPS: 100.0000 mg | ORAL_CAPSULE | Freq: Every day | ORAL | 0 refills | Status: DC

## 2020-03-22 MED ORDER — SODIUM CHLORIDE 0.9% FLUSH
10.0000 mL | Freq: Once | INTRAVENOUS | Status: AC
  Administered 2020-03-22: 10 mL via INTRAVENOUS
  Filled 2020-03-22: qty 10

## 2020-03-22 NOTE — Progress Notes (Signed)
Patient denies new problems/concerns today.   °

## 2020-03-22 NOTE — Progress Notes (Signed)
Hematology/Oncology follow up note [facility] [phone] [phone]   Patient Care Team: Lane, Rachel Elizabeth, PA-C as PCP - General (Family Medicine) Nice, Keith B, OD (Optometry) Eulogia Dismore, MD as Consulting Physician (Hematology and Oncology)  REFERRING PROVIDER: Dr.Sninsky CHIEF COMPLAINTS/REASON FOR VISIT:  Follow up for bladder cancer, anemia.   HISTORY OF PRESENTING ILLNESS:  Angela Russell is a  69 y.o.  female with PMH listed below who was referred to me for evaluation of newly diagnosed bladder cancer. Patient initially presented to emergency room at the end of January 2020 for evaluation of dysuria, hematuria and the left lower quadrant inguinal pain and flank pain.  1/28 2020 CT renal stone study showed suspected irregular wall thickening about the superior bladder, not well assessed due to degree of bladder distention.  Recommend cystoscopy for further evaluation.  No renal stone or obstructive uropathy.  Patient was given IV Rocephin and referred patient for outpatient urology follow-up.  Urine culture was negative.  She again presented to ER after 2 days with similar symptoms.  Patient has 25-pack-year smoking history, quit approximately 20 years ago.  No family history of any urology malignancies 07/03/2018 another CT abdomen pelvis with contrast was done which showed no nephrolithiasis or hydronephrosis is identified.  Bladder is decompressed limiting evaluation.  07/16/2018.urology Dr. Sinskey - cystoscopy and bilateral retrograde pyelogram on 07/16/2018.  Pyelogram did not show any filling defect or abnormalities.  No hydronephrosis.  Ureteral orifice was not involved with tumor.  There is a large 5 cm posterior wall bladder tumor, bullous and sessile appearing.  Patient underwent TURBT.   Pathology: High-grade urothelial carcinoma, invasive into muscularis propria.  Lymphovascular invasion is present.  Carcinoma in situ is also  identified.  Focal squamous differentiation is noted, areas of invasive carcinoma display pleomorphic/sarcomatoid changes.  T2b  08/07/2018 CT without contrast negative.  2 subpleural right upper lobe nodule 2 to 3 mm likely benign. She also had baseline audiometry done.  08/04/2018 ddMVAC x 1 cycle, stopped due to intolerance and AKI.  Patient received 1 cycle of dd MVAC, not able to tolerate due to AKI. Patient then was referred to [facility]  09/22/2018 patient underwent a cystectomy, pathology pT3a N0 Mx.  11 lymph nodes were harvested and was all negative. Invasive urothelia carcinoma, high grade, with sarcomatoid features.   10/14/2018 patient was admitted due to pyelonephritis and a pelvic fluid collection.  Drain was placed. 10/23/2018 drain was removed. Patient has had difficulties getting to her appointments to [facility]. 02/09/2019, patient presented with abdominal pain. CT concerning for small bowel obstruction and increased size of right pelvic fluid collection concerning for cancer recurrence.  Right hydronephrosis to the level of pelvis and enlarged lymph nodes. Patient had JP drain placed with CT guidance to pelvic fluid collection and drained 400 cc amber fluid.  Culture was negative for growth of microorganisms and cytology was negative for malignancy.-JP drain was removed on the day of discharge. CT-guided core biopsy of pelvic lymph node adenopathy was attempted but not successful.  # Right lower extremity DVT, provoked by Transvenous biopsy  02/26/2019 transvenous biopsy by IR unsuccessful transvenous biopsy of the right pelvis mass. Post biopsy acute thrombus in the right external iliac and common femoral vein.  Patient was recommended to start anticoagulation with Xarelto however due to the co-pay, patient is not able to afford the medication. Anticoagulation regimen was switched to Eliquis 5 mg.  Patient reports that she has been taking   it once a day. 03/20/2019 PET showed  FDG avid tissue in the cystectomy bed, retroperitoneal and pelvic lymphadenopathy.  Right common iliac DVT.  # increased right lower extremity swelling.  She was started on Eliquis for anticoagulation by [facility].  We clarified with her pharmacy and she was actually taking Eliquis 2.5 mg twice daily. Right lower extremity swelling has not improved but instead worsened. 03/16/2019 She had ultrasound right lower extremity done which showed persistent extensive proximal right lower extremity DVT.  Anticoagulation regimen has increased to Eliquis 5 mg twice daily.  # establish care with [facility] oncology Dr. [name] for evaluation.  Dr. [name] recommended starting immunotherapy with PD-L1 inhibitor Pembrolizumab 200 mg every 3 weeks.  The sarcomatoid histology may not respond to chemotherapy well, could portend a better chance of response into immunotherapy PET scan after 4 cycles of Keytruda showed single mildly enlarged central mesenteric lymph node in the upper pelvis with SUV 9.6.  No other abnormal hypermetabolic activity was reported.  # 03/30/2019 Status post IVC filter placement, mechanical thrombectomy.-IVC filter was retrieved in January 2021. # PD-L1 CPS 100%.  # 03/31/2019 started on immunotherapy Keytruda.   INTERVAL HISTORY Angela Russell is a 69 y.o. female who has above history reviewed by me today presents for follow up visit for evaluation form metastatic high-grade urothelial carcinoma of the bladder. Sarcomatoid feature.  Patient has been on immunotherapy maintenance.  Patient has no new complaints. She continues to have intermittent right abdomen discomfort which is a chronic issue for her. Review of Systems  Constitutional: Negative for appetite change, chills, fatigue and fever.  HENT:   Negative for hearing loss and voice change.   Eyes: Negative for eye problems.  Respiratory: Negative for chest tightness and cough.   Cardiovascular: Negative for chest pain and leg swelling.    Gastrointestinal: Negative for abdominal distention, abdominal pain and blood in stool.       Abdominal discomfort.   Endocrine: Negative for hot flashes.  Genitourinary: Negative for difficulty urinating and frequency.   Musculoskeletal: Negative for arthralgias.  Skin: Negative for itching and rash.  Neurological: Positive for numbness. Negative for extremity weakness.  Hematological: Negative for adenopathy.  Psychiatric/Behavioral: Negative for confusion. The patient is not nervous/anxious.     MEDICAL HISTORY:  Past Medical History:  Diagnosis Date  . Carotid artery plaque, right 01/2014  . CKD (chronic kidney disease)    stage 2-3  . Diabetes mellitus without complication ([place])   . DM (diabetes mellitus), type 2, uncontrolled ([place])   . Hyperlipidemia   . Hypertension   . Hypochromic microcytic anemia    mild  . Metastatic urothelial carcinoma ([place]) 03/23/2019  . Osteoporosis   . Renal insufficiency   . Rotator cuff tendonitis, right     SURGICAL HISTORY: Past Surgical History:  Procedure Laterality Date  . ABDOMINAL HYSTERECTOMY  2000   due to bleeding and fibroids, partial- still has ovaries  . CYSTOSCOPY W/ RETROGRADES Bilateral 07/16/2018   Procedure: CYSTOSCOPY WITH RETROGRADE PYELOGRAM;  Surgeon: Billey Co, MD;  Location: [facility];  Service: Urology;  Laterality: Bilateral;  . IVC FILTER INSERTION N/A 03/30/2019   Procedure: IVC FILTER INSERTION;  Surgeon: Algernon Huxley, MD;  Location: [facility];  Service: Cardiovascular;  Laterality: N/A;  . IVC FILTER REMOVAL N/A 06/15/2019   Procedure: IVC FILTER REMOVAL;  Surgeon: Algernon Huxley, MD;  Location: [facility];  Service: Cardiovascular;  Laterality: N/A;  . KNEE SURGERY Left  03/17/2013   torn meniscus  . PERIPHERAL VASCULAR THROMBECTOMY Right 03/30/2019   Procedure: PERIPHERAL VASCULAR THROMBECTOMY;  Surgeon: Algernon Huxley, MD;  Location: [facility];  Service: Cardiovascular;   Laterality: Right;  . PORTA CATH INSERTION N/A 08/06/2018   Procedure: PORTA CATH INSERTION;  Surgeon: Algernon Huxley, MD;  Location: [facility];  Service: Cardiovascular;  Laterality: N/A;  . TRANSURETHRAL RESECTION OF BLADDER TUMOR N/A 07/16/2018   Procedure: TRANSURETHRAL RESECTION OF BLADDER TUMOR (TURBT);  Surgeon: Billey Co, MD;  Location: [facility];  Service: Urology;  Laterality: N/A;    SOCIAL HISTORY: Social History   Socioeconomic History  . Marital status: Single    Spouse name: Not on file  . Number of children: 1  . Years of education: Not on file  . Highest education level: 10th grade  Occupational History  . Not on file  Tobacco Use  . Smoking status: Former Smoker    Quit date: 06/05/1991    Years since quitting: 28.8  . Smokeless tobacco: Never Used  Vaping Use  . Vaping Use: Never used  Substance and Sexual Activity  . Alcohol use: No  . Drug use: No  . Sexual activity: Never  Other Topics Concern  . Not on file  Social History Narrative   Working full time   Social Determinants of Radio broadcast assistant Strain:   . Difficulty of Paying Living Expenses: Not on file  Food Insecurity:   . Worried About Charity fundraiser in the Last Year: Not on file  . Ran Out of Food in the Last Year: Not on file  Transportation Needs:   . Lack of Transportation (Medical): Not on file  . Lack of Transportation (Non-Medical): Not on file  Physical Activity:   . Days of Exercise per Week: Not on file  . Minutes of Exercise per Session: Not on file  Stress:   . Feeling of Stress : Not on file  Social Connections:   . Frequency of Communication with Friends and Family: Not on file  . Frequency of Social Gatherings with Friends and Family: Not on file  . Attends Religious Services: Not on file  . Active Member of Clubs or Organizations: Not on file  . Attends Archivist Meetings: Not on file  . Marital Status: Not on file  Intimate Partner  Violence:   . Fear of Current or Ex-Partner: Not on file  . Emotionally Abused: Not on file  . Physically Abused: Not on file  . Sexually Abused: Not on file    FAMILY HISTORY: Family History  Problem Relation Age of Onset  . Heart disease Mother   . Heart attack Mother   . Arthritis Father   . Diabetes Brother     ALLERGIES:  has No Known Allergies.  MEDICATIONS:  Current Outpatient Medications  Medication Sig Dispense Refill  . amLODipine (NORVASC) 5 MG tablet Take 1 tablet (5 mg total) by mouth daily. 90 tablet 1  . diclofenac sodium (VOLTAREN) 1 % GEL Apply 2 g topically 4 (four) times daily. (Patient taking differently: Apply 2 g topically as needed. ) 100 g 2  . losartan (COZAAR) 25 MG tablet Take 1 tablet (25 mg total) by mouth daily. 30 tablet 0  . polyethylene glycol (MIRALAX / GLYCOLAX) 17 g packet Take 17 g by mouth daily as needed for mild constipation. 14 each 0  . rosuvastatin (CRESTOR) 40 MG tablet Take 1 tablet (  40 mg total) by mouth daily. 90 tablet 1  . docusate sodium (COLACE) 100 MG capsule Take 1 capsule (100 mg total) by mouth daily. 90 capsule 0   No current facility-administered medications for this visit.   Facility-Administered Medications Ordered in Other Visits  Medication Dose Route Frequency Provider Last Rate Last Admin  . heparin lock flush 100 unit/mL  500 Units Intracatheter Once PRN [name], MD         PHYSICAL EXAMINATION: ECOG PERFORMANCE STATUS: 1 - Symptomatic but completely ambulatory Vitals:   03/22/20 0932  BP: 139/78  Pulse: 80  Resp: 16  Temp: 97.6 F (36.4 C)   Filed Weights   03/22/20 0932  Weight: 204 lb 9.6 oz (92.8 kg)    Physical Exam Constitutional:      General: She is not in acute distress.    Comments: Walk independently  HENT:     Head: Normocephalic and atraumatic.  Eyes:     General: No scleral icterus.    Pupils: Pupils are equal, round, and reactive to light.  Cardiovascular:     Rate and Rhythm:  Normal rate and regular rhythm.     Heart sounds: Normal heart sounds.  Pulmonary:     Effort: Pulmonary effort is normal. No respiratory distress.     Breath sounds: No wheezing.  Abdominal:     General: Bowel sounds are normal. There is no distension.     Palpations: Abdomen is soft. There is no mass.  Musculoskeletal:        General: No deformity. Normal range of motion.     Cervical back: Normal range of motion and neck supple.  Skin:    General: Skin is warm and dry.     Coloration: Skin is not pale.     Findings: No erythema or rash.     Comments: Ureterostomy, with light yellow color urine in the ostomy bag.  Neurological:     Mental Status: She is alert and oriented to person, place, and time. Mental status is at baseline.     Cranial Nerves: No cranial nerve deficit.     Coordination: Coordination normal.  Psychiatric:        Mood and Affect: Mood normal.     RADIOGRAPHIC STUDIES: I have personally reviewed the radiological images as listed and agreed with the findings in the report.  CMP Latest Ref Rng & Units 03/22/2020  Glucose 70 - 99 mg/dL 163(H)  BUN 8 - 23 mg/dL 31(H)  Creatinine 0.44 - 1.00 mg/dL 1.66(H)  Sodium 135 - 145 mmol/L 138  Potassium 3.5 - 5.1 mmol/L 4.0  Chloride 98 - 111 mmol/L 107  CO2 22 - 32 mmol/L 22  Calcium 8.9 - 10.3 mg/dL 8.9  Total Protein 6.5 - 8.1 g/dL 7.5  Total Bilirubin 0.3 - 1.2 mg/dL 0.6  Alkaline Phos 38 - 126 U/L 93  AST 15 - 41 U/L 19  ALT 0 - 44 U/L 11   CBC Latest Ref Rng & Units 03/22/2020  WBC 4.0 - 10.5 K/uL 5.6  Hemoglobin 12.0 - 15.0 g/dL 9.3(L)  Hematocrit 36 - 46 % 29.8(L)  Platelets 150 - 400 K/uL 223    LABORATORY DATA:  I have reviewed the data as listed Lab Results  Component Value Date   WBC 5.6 03/22/2020   HGB 9.3 (L) 03/22/2020   HCT 29.8 (L) 03/22/2020   MCV 77.6 (L) 03/22/2020   PLT 223 03/22/2020   Recent Labs    02/09/20  5852 02/09/20 0832 02/16/20 1307 03/01/20 1301 03/22/20 0846    NA 138   < > 139 140 138  K 4.4   < > 4.6 4.6 4.0  CL 104   < > 107 108 107  CO2 21*   < > 20* 22 22  GLUCOSE 228*   < > 163* 190* 163*  BUN 41*   < > 49* 33* 31*  CREATININE 2.35*   < > 2.41* 2.08* 1.66*  CALCIUM 9.0   < > 9.3 9.3 8.9  GFRNONAA 20*   < > 20* 24* 31*  GFRAA 24*  --  23* 27*  --   PROT 7.9   < > 8.6* 7.8 7.5  ALBUMIN 4.3   < > 4.5 4.3 4.0  AST 27   < > _0 ALT 14   < > _1 ALKPHOS 100   < > 86 87 93  BILITOT 0.6   < > 0.5 0.5 0.6   < > = values in this interval not displayed.   Iron/TIBC/Ferritin/ %Sat    Component Value Date/Time   IRON 73 11/17/2019 0936   IRON 26 (L) 02/04/2019 1309   TIBC 316 11/17/2019 0936   TIBC 251 02/04/2019 1309   FERRITIN 256 11/17/2019 0936   FERRITIN 409 (H) 02/04/2019 1309   IRONPCTSAT 23 11/17/2019 0936   IRONPCTSAT 10 (L) 02/04/2019 1309    RADIOGRAPHIC STUDIES: I have personally reviewed the radiological images as listed and agreed with the findings in the report. CT Abdomen Pelvis Wo Contrast  Result Date: 01/06/2020 CLINICAL DATA:  Restaging metastatic bladder cancer. History of cystectomy and neobladder surgery in April 2020. EXAM: CT CHEST, ABDOMEN AND PELVIS WITHOUT CONTRAST TECHNIQUE: Multidetector CT imaging of the chest, abdomen and pelvis was performed following the standard protocol without IV contrast. COMPARISON:  PET-CT 09/01/2019 FINDINGS: CT CHEST FINDINGS Cardiovascular: The heart is within normal limits in size and stable. No pericardial effusion. Stable aortic calcifications but no aneurysm. Stable three-vessel coronary artery calcifications. Mediastinum/Nodes: Small scattered mediastinal and hilar lymph nodes are stable. No mass or overt adenopathy the. The esophagus is grossly normal. The thyroid gland is unremarkable. Lungs/Pleura: Stable emphysematous changes and areas of pulmonary scarring. No worrisome pulmonary nodules to suggest pulmonary metastatic disease. No pleural effusions or pleural  lesions. Stable pericardial cyst near the right inferior pulmonary vein. Musculoskeletal: No significant bony findings. CT ABDOMEN PELVIS FINDINGS Hepatobiliary: No hepatic lesions are identified without contrast. The gallbladder is unremarkable. No common bile duct dilatation. Pancreas: No mass, inflammation or ductal dilatation. Spleen: Normal size.  No focal lesions. Adrenals/Urinary Tract: The adrenal glands and kidneys are unremarkable and stable. No worrisome renal lesions or hydronephrosis. Stable surgical changes from a urinary diversion procedure with an ileal conduit. No complicating features are identified. Status post cystectomy. Stomach/Bowel: The stomach, duodenum, small bowel and colon are unremarkable. No acute inflammatory changes, mass lesions or obstructive findings. The terminal ileum is normal. The appendix is normal. Vascular/Lymphatic: Stable advanced atherosclerotic calcifications involving the aorta and branch vessels. Right iliac artery stent is noted. No mesenteric, retroperitoneal or pelvic lymphadenopathy. A few small stable scattered lobe mesenteric lymph nodes are noted. The 6 mm nodule on image 81/2 is unchanged. Reproductive: Surgically absent. Other: Small amount of free pelvic fluid is noted. No pelvic or inguinal adenopathy. Musculoskeletal: No significant bony findings. Moderate lower lumbar facet disease, SI joint degenerative changes and hip joint degenerative changes. IMPRESSION: 1. Stable surgical changes  from a urinary diversion procedure with an ileal conduit. No complicating features are identified. 2. No findings for metastatic disease involving the chest, abdomen or pelvis. 3. Stable emphysematous changes and pulmonary scarring. 4. Stable advanced atherosclerotic calcifications involving the thoracic and abdominal aorta and branch vessels including the coronary arteries. 5. Emphysema and aortic atherosclerosis. Aortic Atherosclerosis (ICD10-I70.0) and Emphysema  (ICD10-J43.9). Electronically Signed   By: Marijo Sanes M.D.   On: 01/06/2020 11:48   CT Chest Wo Contrast  Result Date: 01/06/2020 CLINICAL DATA:  Restaging metastatic bladder cancer. History of cystectomy and neobladder surgery in April 2020. EXAM: CT CHEST, ABDOMEN AND PELVIS WITHOUT CONTRAST TECHNIQUE: Multidetector CT imaging of the chest, abdomen and pelvis was performed following the standard protocol without IV contrast. COMPARISON:  PET-CT 09/01/2019 FINDINGS: CT CHEST FINDINGS Cardiovascular: The heart is within normal limits in size and stable. No pericardial effusion. Stable aortic calcifications but no aneurysm. Stable three-vessel coronary artery calcifications. Mediastinum/Nodes: Small scattered mediastinal and hilar lymph nodes are stable. No mass or overt adenopathy the. The esophagus is grossly normal. The thyroid gland is unremarkable. Lungs/Pleura: Stable emphysematous changes and areas of pulmonary scarring. No worrisome pulmonary nodules to suggest pulmonary metastatic disease. No pleural effusions or pleural lesions. Stable pericardial cyst near the right inferior pulmonary vein. Musculoskeletal: No significant bony findings. CT ABDOMEN PELVIS FINDINGS Hepatobiliary: No hepatic lesions are identified without contrast. The gallbladder is unremarkable. No common bile duct dilatation. Pancreas: No mass, inflammation or ductal dilatation. Spleen: Normal size.  No focal lesions. Adrenals/Urinary Tract: The adrenal glands and kidneys are unremarkable and stable. No worrisome renal lesions or hydronephrosis. Stable surgical changes from a urinary diversion procedure with an ileal conduit. No complicating features are identified. Status post cystectomy. Stomach/Bowel: The stomach, duodenum, small bowel and colon are unremarkable. No acute inflammatory changes, mass lesions or obstructive findings. The terminal ileum is normal. The appendix is normal. Vascular/Lymphatic: Stable advanced  atherosclerotic calcifications involving the aorta and branch vessels. Right iliac artery stent is noted. No mesenteric, retroperitoneal or pelvic lymphadenopathy. A few small stable scattered lobe mesenteric lymph nodes are noted. The 6 mm nodule on image 81/2 is unchanged. Reproductive: Surgically absent. Other: Small amount of free pelvic fluid is noted. No pelvic or inguinal adenopathy. Musculoskeletal: No significant bony findings. Moderate lower lumbar facet disease, SI joint degenerative changes and hip joint degenerative changes. IMPRESSION: 1. Stable surgical changes from a urinary diversion procedure with an ileal conduit. No complicating features are identified. 2. No findings for metastatic disease involving the chest, abdomen or pelvis. 3. Stable emphysematous changes and pulmonary scarring. 4. Stable advanced atherosclerotic calcifications involving the thoracic and abdominal aorta and branch vessels including the coronary arteries. 5. Emphysema and aortic atherosclerosis. Aortic Atherosclerosis (ICD10-I70.0) and Emphysema (ICD10-J43.9). Electronically Signed   By: Marijo Sanes M.D.   On: 01/06/2020 11:48   US RENAL  Result Date: 02/21/2020 CLINICAL DATA:  Elevated creatinine EXAM: RENAL / URINARY TRACT ULTRASOUND COMPLETE COMPARISON:  None. FINDINGS: Right Kidney: Renal measurements: 8.8 x 4.7 x 4.3 cm = volume: 94 mL. The renal cortex is slightly echogenic. No shadowing stones are seen. No mass or hydronephrosis visualized. Left Kidney: Renal measurements: 8.8 x 5.4 x 4.3 cm = volume: 107 mL. The renal cortex is slightly echogenic. No shadowing stones are seen. No mass or hydronephrosis visualized. Bladder: Surgically removed Other: None. IMPRESSION: Diffusely increased parenchymal echogenicity, consistent with medical renal disease. Electronically Signed   By: Prudencio Pair M.D.   On: 02/21/2020  15:06     ASSESSMENT & PLAN:  1. Metastatic urothelial carcinoma ([place])   2. Anemia in stage 4  chronic kidney disease ([place])   3. Encounter for antineoplastic immunotherapy   4. Stage 3b chronic kidney disease [facility])   Cancer Staging Bladder carcinoma [facility]) Staging form: Urinary Bladder, AJCC 8th Edition - Clinical stage from 08/01/2018: Stage IVA (cTX, cN3, cM1a) - Signed by [name], MD on 04/21/2019  #Metastatic urothelial carcinoma of bladder, sarcomatoid features pelvic sidewall mass showed metastatic carcinoma consistent with involvement by urothelial carcinoma. Labs reviewed and discussed with patient. Counts acceptable to proceed with Keytruda today.  #Chronic kidney disease, patient has establish care with nephrologist.  Avoid NSAIDs.  Encourage oral hydration.  Today's creatinine has improved.  #Anemia in stage III chronic kidney disease, hemoglobin is 9.3 today.  She has stopped oral iron supplementation .  I discussed again with her about IV Venofer treatment options and she declined.  She prefers to resume on oral iron supplementation and I recommend her to take twice daily.  I also recommend patient to take Colace 100 mg daily for constipation.  #Right lower extremity  femoral vein DVT provoked by transvenous biopsy in the context of cancer recurrence, Status post IV C filter and mechanical thrombectomy.  IVC filter has been removed. She is currently off anticoagulation due to hematuria.  #Uncontrolled diabetes, continue follow-up with primary care provider.  #Follow-up in 3 weeks for reevaluation of resuming immunotherapy. All questions were answered. The patient knows to call the clinic with any problems questions or concerns.  [name], MD, PhD Hematology Oncology [facility] Pager- [phone] 03/22/20

## 2020-04-12 ENCOUNTER — Inpatient Hospital Stay (HOSPITAL_BASED_OUTPATIENT_CLINIC_OR_DEPARTMENT_OTHER): Payer: Medicare Other | Admitting: Oncology

## 2020-04-12 ENCOUNTER — Encounter: Payer: Self-pay | Admitting: Oncology

## 2020-04-12 ENCOUNTER — Inpatient Hospital Stay: Payer: Medicare Other

## 2020-04-12 ENCOUNTER — Inpatient Hospital Stay: Payer: Medicare Other | Attending: Oncology

## 2020-04-12 VITALS — BP 136/67 | HR 83 | Temp 97.5°F | Resp 18 | Wt 204.5 lb

## 2020-04-12 DIAGNOSIS — Z90710 Acquired absence of both cervix and uterus: Secondary | ICD-10-CM | POA: Insufficient documentation

## 2020-04-12 DIAGNOSIS — K5900 Constipation, unspecified: Secondary | ICD-10-CM | POA: Insufficient documentation

## 2020-04-12 DIAGNOSIS — Z87891 Personal history of nicotine dependence: Secondary | ICD-10-CM | POA: Insufficient documentation

## 2020-04-12 DIAGNOSIS — C7910 Secondary malignant neoplasm of unspecified urinary organs: Secondary | ICD-10-CM | POA: Diagnosis not present

## 2020-04-12 DIAGNOSIS — Z5112 Encounter for antineoplastic immunotherapy: Secondary | ICD-10-CM

## 2020-04-12 DIAGNOSIS — E119 Type 2 diabetes mellitus without complications: Secondary | ICD-10-CM | POA: Diagnosis not present

## 2020-04-12 DIAGNOSIS — Z833 Family history of diabetes mellitus: Secondary | ICD-10-CM | POA: Diagnosis not present

## 2020-04-12 DIAGNOSIS — I10 Essential (primary) hypertension: Secondary | ICD-10-CM | POA: Diagnosis not present

## 2020-04-12 DIAGNOSIS — N1832 Chronic kidney disease, stage 3b: Secondary | ICD-10-CM

## 2020-04-12 DIAGNOSIS — Z79899 Other long term (current) drug therapy: Secondary | ICD-10-CM | POA: Diagnosis not present

## 2020-04-12 DIAGNOSIS — C679 Malignant neoplasm of bladder, unspecified: Secondary | ICD-10-CM | POA: Diagnosis not present

## 2020-04-12 DIAGNOSIS — Z8261 Family history of arthritis: Secondary | ICD-10-CM | POA: Diagnosis not present

## 2020-04-12 DIAGNOSIS — E785 Hyperlipidemia, unspecified: Secondary | ICD-10-CM | POA: Insufficient documentation

## 2020-04-12 DIAGNOSIS — D631 Anemia in chronic kidney disease: Secondary | ICD-10-CM

## 2020-04-12 DIAGNOSIS — N184 Chronic kidney disease, stage 4 (severe): Secondary | ICD-10-CM | POA: Diagnosis not present

## 2020-04-12 DIAGNOSIS — Z86718 Personal history of other venous thrombosis and embolism: Secondary | ICD-10-CM | POA: Diagnosis not present

## 2020-04-12 DIAGNOSIS — Z8249 Family history of ischemic heart disease and other diseases of the circulatory system: Secondary | ICD-10-CM | POA: Insufficient documentation

## 2020-04-12 LAB — COMPREHENSIVE METABOLIC PANEL
ALT: 11 U/L (ref 0–44)
AST: 22 U/L (ref 15–41)
Albumin: 4.1 g/dL (ref 3.5–5.0)
Alkaline Phosphatase: 86 U/L (ref 38–126)
Anion gap: 11 (ref 5–15)
BUN: 26 mg/dL — ABNORMAL HIGH (ref 8–23)
CO2: 22 mmol/L (ref 22–32)
Calcium: 8.8 mg/dL — ABNORMAL LOW (ref 8.9–10.3)
Chloride: 105 mmol/L (ref 98–111)
Creatinine, Ser: 1.66 mg/dL — ABNORMAL HIGH (ref 0.44–1.00)
GFR, Estimated: 33 mL/min — ABNORMAL LOW (ref >60)
Glucose, Bld: 162 mg/dL — ABNORMAL HIGH (ref 70–99)
Potassium: 4 mmol/L (ref 3.5–5.1)
Sodium: 138 mmol/L (ref 135–145)
Total Bilirubin: 0.4 mg/dL (ref 0.3–1.2)
Total Protein: 7.5 g/dL (ref 6.5–8.1)

## 2020-04-12 LAB — CBC WITH DIFFERENTIAL/PLATELET
Abs Immature Granulocytes: 0.01 10*3/uL (ref 0.00–0.07)
Basophils Absolute: 0 10*3/uL (ref 0.0–0.1)
Basophils Relative: 0 %
Eosinophils Absolute: 0.2 10*3/uL (ref 0.0–0.5)
Eosinophils Relative: 4 %
HCT: 30.4 % — ABNORMAL LOW (ref 36.0–46.0)
Hemoglobin: 9.3 g/dL — ABNORMAL LOW (ref 12.0–15.0)
Immature Granulocytes: 0 %
Lymphocytes Relative: 27 %
Lymphs Abs: 1.4 10*3/uL (ref 0.7–4.0)
MCH: 24 pg — ABNORMAL LOW (ref 26.0–34.0)
MCHC: 30.6 g/dL (ref 30.0–36.0)
MCV: 78.4 fL — ABNORMAL LOW (ref 80.0–100.0)
Monocytes Absolute: 0.4 10*3/uL (ref 0.1–1.0)
Monocytes Relative: 7 %
Neutro Abs: 3.3 10*3/uL (ref 1.7–7.7)
Neutrophils Relative %: 62 %
Platelets: 230 10*3/uL (ref 150–400)
RBC: 3.88 MIL/uL (ref 3.87–5.11)
RDW: 13.6 % (ref 11.5–15.5)
WBC: 5.4 10*3/uL (ref 4.0–10.5)
nRBC: 0 % (ref 0.0–0.2)

## 2020-04-12 MED ORDER — SODIUM CHLORIDE 0.9% FLUSH
10.0000 mL | Freq: Once | INTRAVENOUS | Status: AC
  Administered 2020-04-12: 10 mL via INTRAVENOUS
  Filled 2020-04-12: qty 10

## 2020-04-12 MED ORDER — HEPARIN SOD (PORK) LOCK FLUSH 100 UNIT/ML IV SOLN
INTRAVENOUS | Status: AC
  Filled 2020-04-12: qty 5

## 2020-04-12 MED ORDER — SODIUM CHLORIDE 0.9 % IV SOLN
Freq: Once | INTRAVENOUS | Status: AC
  Filled 2020-04-12: qty 250

## 2020-04-12 MED ORDER — SODIUM CHLORIDE 0.9 % IV SOLN
200.0000 mg | Freq: Once | INTRAVENOUS | Status: AC
  Administered 2020-04-12: 200 mg via INTRAVENOUS
  Filled 2020-04-12: qty 8

## 2020-04-12 MED ORDER — HEPARIN SOD (PORK) LOCK FLUSH 100 UNIT/ML IV SOLN
500.0000 [IU] | Freq: Once | INTRAVENOUS | Status: AC | PRN
  Administered 2020-04-12: 500 [IU]
  Filled 2020-04-12: qty 5

## 2020-04-12 NOTE — Progress Notes (Signed)
Pt here for follow up. No new concerns voiced.   

## 2020-04-12 NOTE — Progress Notes (Signed)
Hematology/Oncology follow up note [facility] [phone] [phone]   Patient Care Team: Lane, Rachel Elizabeth, PA-C as PCP - General (Family Medicine) Nice, Keith B, OD (Optometry) , , MD as Consulting Physician (Hematology and Oncology)  REFERRING PROVIDER: Dr.Sninsky CHIEF COMPLAINTS/REASON FOR VISIT:  Follow up for bladder cancer, anemia.   HISTORY OF PRESENTING ILLNESS:  Angela Russell is a  69 y.o.  female with PMH listed below who was referred to me for evaluation of newly diagnosed bladder cancer. Patient initially presented to emergency room at the end of January 2020 for evaluation of dysuria, hematuria and the left lower quadrant inguinal pain and flank pain.  1/28 2020 CT renal stone study showed suspected irregular wall thickening about the superior bladder, not well assessed due to degree of bladder distention.  Recommend cystoscopy for further evaluation.  No renal stone or obstructive uropathy.  Patient was given IV Rocephin and referred patient for outpatient urology follow-up.  Urine culture was negative.  She again presented to ER after 2 days with similar symptoms.  Patient has 25-pack-year smoking history, quit approximately 20 years ago.  No family history of any urology malignancies 07/03/2018 another CT abdomen pelvis with contrast was done which showed no nephrolithiasis or hydronephrosis is identified.  Bladder is decompressed limiting evaluation.  07/16/2018.urology Dr. Sinskey - cystoscopy and bilateral retrograde pyelogram on 07/16/2018.  Pyelogram did not show any filling defect or abnormalities.  No hydronephrosis.  Ureteral orifice was not involved with tumor.  There is a large 5 cm posterior wall bladder tumor, bullous and sessile appearing.  Patient underwent TURBT.   Pathology: High-grade urothelial carcinoma, invasive into muscularis propria.  Lymphovascular invasion is present.  Carcinoma in situ is also  identified.  Focal squamous differentiation is noted, areas of invasive carcinoma display pleomorphic/sarcomatoid changes.  T2b  08/07/2018 CT without contrast negative.  2 subpleural right upper lobe nodule 2 to 3 mm likely benign. She also had baseline audiometry done.  08/04/2018 ddMVAC x 1 cycle, stopped due to intolerance and AKI.  Patient received 1 cycle of dd MVAC, not able to tolerate due to AKI. Patient then was referred to [facility]  09/22/2018 patient underwent a cystectomy, pathology pT3a N0 Mx.  11 lymph nodes were harvested and was all negative. Invasive urothelia carcinoma, high grade, with sarcomatoid features.   10/14/2018 patient was admitted due to pyelonephritis and a pelvic fluid collection.  Drain was placed. 10/23/2018 drain was removed. Patient has had difficulties getting to her appointments to [facility]. 02/09/2019, patient presented with abdominal pain. CT concerning for small bowel obstruction and increased size of right pelvic fluid collection concerning for cancer recurrence.  Right hydronephrosis to the level of pelvis and enlarged lymph nodes. Patient had JP drain placed with CT guidance to pelvic fluid collection and drained 400 cc amber fluid.  Culture was negative for growth of microorganisms and cytology was negative for malignancy.-JP drain was removed on the day of discharge. CT-guided core biopsy of pelvic lymph node adenopathy was attempted but not successful.  # Right lower extremity DVT, provoked by Transvenous biopsy  02/26/2019 transvenous biopsy by IR unsuccessful transvenous biopsy of the right pelvis mass. Post biopsy acute thrombus in the right external iliac and common femoral vein.  Patient was recommended to start anticoagulation with Xarelto however due to the co-pay, patient is not able to afford the medication. Anticoagulation regimen was switched to Eliquis 5 mg.  Patient reports that she has been taking   it once a day. 03/20/2019 PET showed  FDG avid tissue in the cystectomy bed, retroperitoneal and pelvic lymphadenopathy.  Right common iliac DVT.  # increased right lower extremity swelling.  She was started on Eliquis for anticoagulation by [facility].  We clarified with her pharmacy and she was actually taking Eliquis 2.5 mg twice daily. Right lower extremity swelling has not improved but instead worsened. 03/16/2019 She had ultrasound right lower extremity done which showed persistent extensive proximal right lower extremity DVT.  Anticoagulation regimen has increased to Eliquis 5 mg twice daily.  # establish care with [facility] oncology Dr. Harrison for evaluation.  Dr. Harrison recommended starting immunotherapy with PD-L1 inhibitor Pembrolizumab 200 mg every 3 weeks.  The sarcomatoid histology may not respond to chemotherapy well, could portend a better chance of response into immunotherapy PET scan after 4 cycles of Keytruda showed single mildly enlarged central mesenteric lymph node in the upper pelvis with SUV 9.6.  No other abnormal hypermetabolic activity was reported.  # #Right lower extremity  femoral vein DVT provoked by transvenous biopsy in the context of cancer recurrence, Status post IV C filter and mechanical thrombectomy.  IVC filter has been removed. She is currently off anticoagulation after she developed hematuria.  # 03/30/2019 Status post IVC filter placement, mechanical thrombectomy.-IVC filter was retrieved in January 2021. # PD-L1 CPS 100%.  # 03/31/2019 started on immunotherapy Keytruda.   INTERVAL HISTORY Angela Russell is a 69 y.o. female who has above history reviewed by me today presents for follow up visit for evaluation form metastatic high-grade urothelial carcinoma of the bladder. Sarcomatoid feature.  Patient has been on immunotherapy and has tolerated well. She continues to have intermittent right abdomen discomfort.  No new complaints.  Review of Systems  Constitutional: Negative for appetite change,  chills, fatigue and fever.  HENT:   Negative for hearing loss and voice change.   Eyes: Negative for eye problems.  Respiratory: Negative for chest tightness and cough.   Cardiovascular: Negative for chest pain and leg swelling.  Gastrointestinal: Negative for abdominal distention, abdominal pain and blood in stool.       Abdominal discomfort.   Endocrine: Negative for hot flashes.  Genitourinary: Negative for difficulty urinating and frequency.   Musculoskeletal: Negative for arthralgias.  Skin: Negative for itching and rash.  Neurological: Positive for numbness. Negative for extremity weakness.  Hematological: Negative for adenopathy.  Psychiatric/Behavioral: Negative for confusion. The patient is not nervous/anxious.     MEDICAL HISTORY:  Past Medical History:  Diagnosis Date  . Carotid artery plaque, right 01/2014  . CKD (chronic kidney disease)    stage 2-3  . Diabetes mellitus without complication (HCC)   . DM (diabetes mellitus), type 2, uncontrolled (HCC)   . Hyperlipidemia   . Hypertension   . Hypochromic microcytic anemia    mild  . Metastatic urothelial carcinoma (HCC) 03/23/2019  . Osteoporosis   . Renal insufficiency   . Rotator cuff tendonitis, right     SURGICAL HISTORY: Past Surgical History:  Procedure Laterality Date  . ABDOMINAL HYSTERECTOMY  2000   due to bleeding and fibroids, partial- still has ovaries  . CYSTOSCOPY W/ RETROGRADES Bilateral 07/16/2018   Procedure: CYSTOSCOPY WITH RETROGRADE PYELOGRAM;  Surgeon: Sninsky, Brian C, MD;  Location: [facility] ORS;  Service: Urology;  Laterality: Bilateral;  . IVC FILTER INSERTION N/A 03/30/2019   Procedure: IVC FILTER INSERTION;  Surgeon: Dew, Jason S, MD;  Location: [facility] INVASIVE CV LAB;  Service: Cardiovascular;  Laterality: N/A;  .   IVC FILTER REMOVAL N/A 06/15/2019   Procedure: IVC FILTER REMOVAL;  Surgeon: Dew, Jason S, MD;  Location: [facility] INVASIVE CV LAB;  Service: Cardiovascular;  Laterality: N/A;  . KNEE  SURGERY Left 03/17/2013   torn meniscus  . PERIPHERAL VASCULAR THROMBECTOMY Right 03/30/2019   Procedure: PERIPHERAL VASCULAR THROMBECTOMY;  Surgeon: Dew, Jason S, MD;  Location: [facility];  Service: Cardiovascular;  Laterality: Right;  . PORTA CATH INSERTION N/A 08/06/2018   Procedure: PORTA CATH INSERTION;  Surgeon: Dew, Jason S, MD;  Location: [facility] IN[facility];  Service: Cardiovascular;  Laterality: N/A;  . TRANSURETHRAL RESECTION OF BLADDER TUMOR N/A 07/16/2018   Procedure: TRANSURETHRAL RESECTION OF BLADDER TUMOR (TURBT);  Surgeon: Sninsky, Brian C, MD;  Location: [facility] ORS;  Service: Urology;  Laterality: N/A;    SOCIAL HISTORY: Social History   Socioeconomic History  . Marital status: Single    Spouse name: Not on file  . Number of children: 1  . Years of education: Not on file  . Highest education level: 10th grade  Occupational History  . Not on file  Tobacco Use  . Smoking status: Former Smoker    Quit date: 06/05/1991    Years since quitting: 28.8  . Smokeless tobacco: Never Used  Vaping Use  . Vaping Use: Never used  Substance and Sexual Activity  . Alcohol use: No  . Drug use: No  . Sexual activity: Never  Other Topics Concern  . Not on file  Social History Narrative   Working full time   Social Determinants of Health   Financial Resource Strain:   . Difficulty of Paying Living Expenses: Not on file  Food Insecurity:   . Worried About Running Out of Food in the Last Year: Not on file  . Ran Out of Food in the Last Year: Not on file  Transportation Needs:   . Lack of Transportation (Medical): Not on file  . Lack of Transportation (Non-Medical): Not on file  Physical Activity:   . Days of Exercise per Week: Not on file  . Minutes of Exercise per Session: Not on file  Stress:   . Feeling of Stress : Not on file  Social Connections:   . Frequency of Communication with Friends and Family: Not on file  . Frequency of Social Gatherings with Friends  and Family: Not on file  . Attends Religious Services: Not on file  . Active Member of Clubs or Organizations: Not on file  . Attends Club or Organization Meetings: Not on file  . Marital Status: Not on file  Intimate Partner Violence:   . Fear of Current or Ex-Partner: Not on file  . Emotionally Abused: Not on file  . Physically Abused: Not on file  . Sexually Abused: Not on file    FAMILY HISTORY: Family History  Problem Relation Age of Onset  . Heart disease Mother   . Heart attack Mother   . Arthritis Father   . Diabetes Brother     ALLERGIES:  has No Known Allergies.  MEDICATIONS:  Current Outpatient Medications  Medication Sig Dispense Refill  . amLODipine (NORVASC) 5 MG tablet Take 1 tablet (5 mg total) by mouth daily. 90 tablet 1  . diclofenac sodium (VOLTAREN) 1 % GEL Apply 2 g topically 4 (four) times daily. (Patient taking differently: Apply 2 g topically as needed. ) 100 g 2  . docusate sodium (COLACE) 100 MG capsule Take 1 capsule (100 mg total) by mouth daily. 90 capsule   0  . losartan (COZAAR) 25 MG tablet Take 1 tablet (25 mg total) by mouth daily. 30 tablet 0  . polyethylene glycol (MIRALAX / GLYCOLAX) 17 g packet Take 17 g by mouth daily as needed for mild constipation. 14 each 0  . rosuvastatin (CRESTOR) 40 MG tablet Take 1 tablet (40 mg total) by mouth daily. 90 tablet 1   No current facility-administered medications for this visit.     PHYSICAL EXAMINATION: ECOG PERFORMANCE STATUS: 1 - Symptomatic but completely ambulatory Vitals:   04/12/20 0922  BP: 136/67  Pulse: 83  Resp: 18  Temp: (!) 97.5 F (36.4 C)   Filed Weights   04/12/20 0922  Weight: 204 lb 8 oz (92.8 kg)    Physical Exam Constitutional:      General: She is not in acute distress.    Comments: Walk independently  HENT:     Head: Normocephalic and atraumatic.  Eyes:     General: No scleral icterus.    Pupils: Pupils are equal, round, and reactive to light.  Cardiovascular:       Rate and Rhythm: Normal rate and regular rhythm.     Heart sounds: Normal heart sounds.  Pulmonary:     Effort: Pulmonary effort is normal. No respiratory distress.     Breath sounds: No wheezing.  Abdominal:     General: Bowel sounds are normal. There is no distension.     Palpations: Abdomen is soft. There is no mass.     Comments: Ureterostomy,   Musculoskeletal:        General: No deformity. Normal range of motion.     Cervical back: Normal range of motion and neck supple.  Skin:    General: Skin is warm and dry.     Coloration: Skin is not pale.     Findings: No erythema or rash.  Neurological:     Mental Status: She is alert and oriented to person, place, and time. Mental status is at baseline.     Cranial Nerves: No cranial nerve deficit.     Coordination: Coordination normal.  Psychiatric:        Mood and Affect: Mood normal.     RADIOGRAPHIC STUDIES: I have personally reviewed the radiological images as listed and agreed with the findings in the report.  CMP Latest Ref Rng & Units 04/12/2020  Glucose 70 - 99 mg/dL 162(H)  BUN 8 - 23 mg/dL 26(H)  Creatinine 0.44 - 1.00 mg/dL 1.66(H)  Sodium 135 - 145 mmol/L 138  Potassium 3.5 - 5.1 mmol/L 4.0  Chloride 98 - 111 mmol/L 105  CO2 22 - 32 mmol/L 22  Calcium 8.9 - 10.3 mg/dL 8.8(L)  Total Protein 6.5 - 8.1 g/dL 7.5  Total Bilirubin 0.3 - 1.2 mg/dL 0.4  Alkaline Phos 38 - 126 U/L 86  AST 15 - 41 U/L 22  ALT 0 - 44 U/L 11   CBC Latest Ref Rng & Units 04/12/2020  WBC 4.0 - 10.5 K/uL 5.4  Hemoglobin 12.0 - 15.0 g/dL 9.3(L)  Hematocrit 36 - 46 % 30.4(L)  Platelets 150 - 400 K/uL 230    LABORATORY DATA:  I have reviewed the data as listed Lab Results  Component Value Date   WBC 5.4 04/12/2020   HGB 9.3 (L) 04/12/2020   HCT 30.4 (L) 04/12/2020   MCV 78.4 (L) 04/12/2020   PLT 230 04/12/2020   Recent Labs    02/09/20 0832 02/09/20 0832 02/16/20 1307 02/16/20 1307 03/01/20 1301 03/22/20   3710  04/12/20 0846  NA 138   < > 139   < > 140 138 138  K 4.4   < > 4.6   < > 4.6 4.0 4.0  CL 104   < > 107   < > 108 107 105  CO2 21*   < > 20*   < > _0 GLUCOSE 228*   < > 163*   < > 190* 163* 162*  BUN 41*   < > 49*   < > 33* 31* 26*  CREATININE 2.35*   < > 2.41*   < > 2.08* 1.66* 1.66*  CALCIUM 9.0   < > 9.3   < > 9.3 8.9 8.8*  GFRNONAA 20*   < > 20*   < > 24* 31* 33*  GFRAA 24*  --  23*  --  27*  --   --   PROT 7.9   < > 8.6*   < > 7.8 7.5 7.5  ALBUMIN 4.3   < > 4.5   < > 4.3 4.0 4.1  AST 27   < > 22   < > _1 ALT 14   < > 14   < > _2 ALKPHOS 100   < > 86   < > 87 93 86  BILITOT 0.6   < > 0.5   < > 0.5 0.6 0.4   < > = values in this interval not displayed.   Iron/TIBC/Ferritin/ %Sat    Component Value Date/Time   IRON 73 11/17/2019 0936   IRON 26 (L) 02/04/2019 1309   TIBC 316 11/17/2019 0936   TIBC 251 02/04/2019 1309   FERRITIN 256 11/17/2019 0936   FERRITIN 409 (H) 02/04/2019 1309   IRONPCTSAT 23 11/17/2019 0936   IRONPCTSAT 10 (L) 02/04/2019 1309    RADIOGRAPHIC STUDIES: I have personally reviewed the radiological images as listed and agreed with the findings in the report. US RENAL  Result Date: 02/21/2020 CLINICAL DATA:  Elevated creatinine EXAM: RENAL / URINARY TRACT ULTRASOUND COMPLETE COMPARISON:  None. FINDINGS: Right Kidney: Renal measurements: 8.8 x 4.7 x 4.3 cm = volume: 94 mL. The renal cortex is slightly echogenic. No shadowing stones are seen. No mass or hydronephrosis visualized. Left Kidney: Renal measurements: 8.8 x 5.4 x 4.3 cm = volume: 107 mL. The renal cortex is slightly echogenic. No shadowing stones are seen. No mass or hydronephrosis visualized. Bladder: Surgically removed Other: None. IMPRESSION: Diffusely increased parenchymal echogenicity, consistent with medical renal disease. Electronically Signed   By: Prudencio Pair M.D.   On: 02/21/2020 15:06     ASSESSMENT & PLAN:  1. Metastatic urothelial carcinoma ([place])   2. Encounter for  antineoplastic immunotherapy   3. Anemia in stage 3b chronic kidney disease [facility])   Cancer Staging Bladder carcinoma [facility]) Staging form: Urinary Bladder, AJCC 8th Edition - Clinical stage from 08/01/2018: Stage IVA (cTX, cN3, cM1a) - Signed by [name], MD on 04/21/2019  #Metastatic urothelial carcinoma of bladder, sarcomatoid features pelvic sidewall mass showed metastatic carcinoma consistent with involvement by urothelial carcinoma. Labs are reviewed and discussed with patient Counts are acceptable to proceed with [facility] treatment today. I will obtain surveillance CT scan.  #Chronic kidney disease, creatinine has been stable. Avoid nephrotoxins.  Follow-up with nephrology.  #Chronic anemia in stage III CKD, hemoglobin is stable at 9.3. I asked patient to resume on oral iron supplementation with stool softener.  She previously has declined  IV Venofer treatments  #Uncontrolled diabetes, recommend patient continue follow-up with primary care provider  #Follow-up in 3 weeks for reevaluation of resuming immunotherapy. All questions were answered. The patient knows to call the clinic with any problems questions or concerns.   , MD, PhD Hematology Oncology  [facility] Pager- [phone] 04/12/20 

## 2020-04-12 NOTE — Progress Notes (Signed)
Pt tolerated infusion well with no signs of complications. VSS. Pt stable for discharge. Pt discharged home.   Angela Russell  

## 2020-04-15 ENCOUNTER — Ambulatory Visit: Payer: Medicare Other | Admitting: Family Medicine

## 2020-04-15 ENCOUNTER — Ambulatory Visit: Payer: Medicare Other | Admitting: Nurse Practitioner

## 2020-04-22 ENCOUNTER — Encounter: Payer: Self-pay | Admitting: Nurse Practitioner

## 2020-04-22 ENCOUNTER — Ambulatory Visit (INDEPENDENT_AMBULATORY_CARE_PROVIDER_SITE_OTHER): Payer: Medicare Other | Admitting: Nurse Practitioner

## 2020-04-22 ENCOUNTER — Other Ambulatory Visit: Payer: Self-pay

## 2020-04-22 DIAGNOSIS — I152 Hypertension secondary to endocrine disorders: Secondary | ICD-10-CM

## 2020-04-22 DIAGNOSIS — E1159 Type 2 diabetes mellitus with other circulatory complications: Secondary | ICD-10-CM | POA: Diagnosis not present

## 2020-04-22 MED ORDER — LOSARTAN POTASSIUM 25 MG PO TABS: 25.0000 mg | ORAL_TABLET | Freq: Every day | ORAL | 1 refills | Status: DC

## 2020-04-22 NOTE — Progress Notes (Signed)
BP (!) 142/82 (BP Location: Right Arm, Cuff Size: Normal)   Pulse 94   Temp 98.4 F (36.9 C) (Oral)   Wt 206 lb 3.2 oz (93.5 kg)   SpO2 96%   BMI 36.53 kg/m    Subjective:    Patient ID: Angela Russell, female    DOB: February 26, 1951, 69 y.o.   MRN: [id number]  HPI: Angela Russell is a 69 y.o. female presenting for blood pressure follow up.  Chief Complaint  Patient presents with  . Hypertension    pt states she last took her BP medication last night    HYPERTENSION Hypertension status: controlled  Satisfied with current treatment? yes Duration of hypertension: chronic BP monitoring frequency:  weekly BP range: 130/60-70 BP medication side effects:  no Medication compliance: excellent Aspirin: no Recurrent headaches: no Visual changes: no Palpitations: no Dyspnea: no Chest pain: no Lower extremity edema: no Dizzy/lightheaded: no  No Known Allergies  Outpatient Encounter Medications as of 04/22/2020  Medication Sig  . amLODipine (NORVASC) 5 MG tablet Take 1 tablet (5 mg total) by mouth daily.  . diclofenac sodium (VOLTAREN) 1 % GEL Apply 2 g topically 4 (four) times daily. (Patient taking differently: Apply 2 g topically as needed. )  . docusate sodium (COLACE) 100 MG capsule Take 1 capsule (100 mg total) by mouth daily.  Marland Kitchen losartan (COZAAR) 25 MG tablet Take 1 tablet (25 mg total) by mouth daily.  . polyethylene glycol (MIRALAX / GLYCOLAX) 17 g packet Take 17 g by mouth daily as needed for mild constipation.  . rosuvastatin (CRESTOR) 40 MG tablet Take 1 tablet (40 mg total) by mouth daily.  . [DISCONTINUED] losartan (COZAAR) 25 MG tablet Take 1 tablet (25 mg total) by mouth daily.  . [DISCONTINUED] prochlorperazine (COMPAZINE) 10 MG tablet Take 1 tablet (10 mg total) by mouth every 6 (six) hours as needed (Nausea or vomiting).   No facility-administered encounter medications on file as of 04/22/2020.   Patient Active Problem List   Diagnosis Date Noted  . Rib  pain on left side 08/05/2019  . Chronic anticoagulation 08/05/2019  . Anemia in stage 3a chronic kidney disease ([place]) 08/05/2019  . Encounter for antineoplastic immunotherapy 08/05/2019  . History of DVT of lower extremity 06/26/2019  . Acute deep vein thrombosis (DVT) of femoral vein of right lower extremity ([place]) 03/31/2019  . DVT (deep venous thrombosis) ([place]) 03/27/2019  . Metastatic urothelial carcinoma ([place]) 03/23/2019  . Vaginal pain 01/27/2019  . Neuropathic pain 01/27/2019  . Hyperkalemia 12/19/2018  . Fluid collection at surgical site 10/17/2018  . Tachycardia 10/15/2018  . Pyelonephritis 10/14/2018  . Iron deficiency anemia due to chronic blood loss 08/26/2018  . AKI (acute kidney injury) ([place]) 08/06/2018  . Bladder carcinoma ([place]) 08/03/2018  . Goals of care, counseling/discussion 08/03/2018  . Bladder tumor 07/16/2018  . Hyperlipidemia   . Hypertension associated with diabetes ([place])   . Controlled diabetes mellitus type 2 with complications ([place])   . Renal insufficiency   . CKD (chronic kidney disease)   . Hypochromic microcytic anemia   . Chronic anemia 07/24/2016  . Carotid artery plaque, right 01/02/2014  . Cervical facet joint syndrome 03/20/2013  . Spondylolisthesis of lumbar region 03/20/2013   Past Medical History:  Diagnosis Date  . Carotid artery plaque, right 01/2014  . CKD (chronic kidney disease)    stage 2-3  . Diabetes mellitus without complication ([place])   . DM (diabetes mellitus), type 2, uncontrolled ([place])   .  Hyperlipidemia   . Hypertension   . Hypochromic microcytic anemia    mild  . Metastatic urothelial carcinoma ([place]) 03/23/2019  . Osteoporosis   . Renal insufficiency   . Rotator cuff tendonitis, right    Relevant past medical, surgical, family and social history reviewed and updated as indicated. Interim medical history since our last visit reviewed.  Review of Systems  Constitutional: Negative.  Negative for activity change,  appetite change and fatigue.  Eyes: Negative.  Negative for visual disturbance.  Respiratory: Negative.  Negative for shortness of breath.   Cardiovascular: Negative.  Negative for chest pain, palpitations and leg swelling.  Skin: Negative.   Neurological: Negative.  Negative for dizziness, light-headedness and headaches.  Psychiatric/Behavioral: Negative.     Per HPI unless specifically indicated above     Objective:    BP (!) 142/82 (BP Location: Right Arm, Cuff Size: Normal)   Pulse 94   Temp 98.4 F (36.9 C) (Oral)   Wt 206 lb 3.2 oz (93.5 kg)   SpO2 96%   BMI 36.53 kg/m   Wt Readings from Last 3 Encounters:  04/22/20 206 lb 3.2 oz (93.5 kg)  04/12/20 204 lb 8 oz (92.8 kg)  03/22/20 204 lb 9.6 oz (92.8 kg)    Physical Exam Vitals and nursing note reviewed.  Constitutional:      General: She is not in acute distress.    Appearance: Normal appearance. She is not toxic-appearing.  HENT:     Head: Normocephalic and atraumatic.  Eyes:     General: No scleral icterus.    Extraocular Movements: Extraocular movements intact.     Pupils: Pupils are equal, round, and reactive to light.  Cardiovascular:     Rate and Rhythm: Normal rate and regular rhythm.     Heart sounds: Normal heart sounds. No murmur heard.   Pulmonary:     Effort: Pulmonary effort is normal. No respiratory distress.     Breath sounds: Normal breath sounds. No wheezing, rhonchi or rales.  Skin:    General: Skin is warm and dry.     Coloration: Skin is not jaundiced or pale.     Findings: No erythema.  Neurological:     General: No focal deficit present.     Mental Status: She is alert and oriented to person, place, and time.     Motor: No weakness.     Gait: Gait normal.  Psychiatric:        Mood and Affect: Mood normal.        Behavior: Behavior normal.        Thought Content: Thought content normal.        Judgment: Judgment normal.     Results for orders placed or performed in visit on  04/12/20  CBC with Differential  Result Value Ref Range   WBC 5.4 4.0 - 10.5 K/uL   RBC 3.88 3.87 - 5.11 MIL/uL   Hemoglobin 9.3 (L) 12.0 - 15.0 g/dL   HCT 30.4 (L) 36 - 46 %   MCV 78.4 (L) 80.0 - 100.0 fL   MCH 24.0 (L) 26.0 - 34.0 pg   MCHC 30.6 30.0 - 36.0 g/dL   RDW 13.6 11.5 - 15.5 %   Platelets 230 150 - 400 K/uL   nRBC 0.0 0.0 - 0.2 %   Neutrophils Relative % 62 %   Neutro Abs 3.3 1.7 - 7.7 K/uL   Lymphocytes Relative 27 %   Lymphs Abs 1.4 0.7 - 4.0 K/uL  Monocytes Relative 7 %   Monocytes Absolute 0.4 0.1 - 1.0 K/uL   Eosinophils Relative 4 %   Eosinophils Absolute 0.2 0.0 - 0.5 K/uL   Basophils Relative 0 %   Basophils Absolute 0.0 0.0 - 0.1 K/uL   Immature Granulocytes 0 %   Abs Immature Granulocytes 0.01 0.00 - 0.07 K/uL  Comprehensive metabolic panel  Result Value Ref Range   Sodium 138 135 - 145 mmol/L   Potassium 4.0 3.5 - 5.1 mmol/L   Chloride 105 98 - 111 mmol/L   CO2 22 22 - 32 mmol/L   Glucose, Bld 162 (H) 70 - 99 mg/dL   BUN 26 (H) 8 - 23 mg/dL   Creatinine, Ser 1.66 (H) 0.44 - 1.00 mg/dL   Calcium 8.8 (L) 8.9 - 10.3 mg/dL   Total Protein 7.5 6.5 - 8.1 g/dL   Albumin 4.1 3.5 - 5.0 g/dL   AST 22 15 - 41 U/L   ALT 11 0 - 44 U/L   Alkaline Phosphatase 86 38 - 126 U/L   Total Bilirubin 0.4 0.3 - 1.2 mg/dL   GFR, Estimated 33 (L) >60 mL/min   Anion gap 11 5 - 15      Assessment & Plan:   Problem List Items Addressed This Visit      Cardiovascular and Mediastinum   Hypertension associated with diabetes (HCC)    Chronic, stable.  BP recheck in office today improved, still slightly above goal of 130/80.  Reports good control of BP at home on additional low-dose losartan 25 mg with amlodipine 5 mg.  Will continue this medication for now and closely monitor renal function.  GFR appears to be improving with addition of amlodipine and BP at oncologist's office are at goal.  Continue to follow closely - 3 months or sooner if BP at home > 130/80.        Relevant Medications   losartan (COZAAR) 25 MG tablet       Follow up plan: Return in about 3 months (around 07/23/2020) for HTN, HLD, CKD f/u.

## 2020-04-22 NOTE — Assessment & Plan Note (Addendum)
Chronic, stable.  BP recheck in office today improved, still slightly above goal of 130/80.  Reports good control of BP at home on additional low-dose losartan 25 mg with amlodipine 5 mg.  Will continue this medication for now and closely monitor renal function.  GFR appears to be improving with addition of losartan and BP at oncologist's office are at goal.  Continue to follow closely - 3 months or sooner if BP at home > 130/80.

## 2020-04-22 NOTE — Patient Instructions (Signed)
DASH Eating Plan DASH stands for "Dietary Approaches to Stop Hypertension." The DASH eating plan is a healthy eating plan that has been shown to reduce high blood pressure (hypertension). It may also reduce your risk for type 2 diabetes, heart disease, and stroke. The DASH eating plan may also help with weight loss. What are tips for following this plan?  General guidelines  Avoid eating more than 2,300 mg (milligrams) of salt (sodium) a day. If you have hypertension, you may need to reduce your sodium intake to 1,500 mg a day.  Limit alcohol intake to no more than 1 drink a day for nonpregnant women and 2 drinks a day for men. One drink equals 12 oz of beer, 5 oz of wine, or 1 oz of hard liquor.  Work with your health care provider to maintain a healthy body weight or to lose weight. Ask what an ideal weight is for you.  Get at least 30 minutes of exercise that causes your heart to beat faster (aerobic exercise) most days of the week. Activities may include walking, swimming, or biking.  Work with your health care provider or diet and nutrition specialist (dietitian) to adjust your eating plan to your individual calorie needs. Reading food labels   Check food labels for the amount of sodium per serving. Choose foods with less than 5 percent of the Daily Value of sodium. Generally, foods with less than 300 mg of sodium per serving fit into this eating plan.  To find whole grains, look for the word "whole" as the first word in the ingredient list. Shopping  Buy products labeled as "low-sodium" or "no salt added."  Buy fresh foods. Avoid canned foods and premade or frozen meals. Cooking  Avoid adding salt when cooking. Use salt-free seasonings or herbs instead of table salt or sea salt. Check with your health care provider or pharmacist before using salt substitutes.  Do not fry foods. Cook foods using healthy methods such as baking, boiling, grilling, and broiling instead.  Cook with  heart-healthy oils, such as olive, canola, soybean, or sunflower oil. Meal planning  Eat a balanced diet that includes: ? 5 or more servings of fruits and vegetables each day. At each meal, try to fill half of your plate with fruits and vegetables. ? Up to 6-8 servings of whole grains each day. ? Less than 6 oz of lean meat, poultry, or fish each day. A 3-oz serving of meat is about the same size as a deck of cards. One egg equals 1 oz. ? 2 servings of low-fat dairy each day. ? A serving of nuts, seeds, or beans 5 times each week. ? Heart-healthy fats. Healthy fats called Omega-3 fatty acids are found in foods such as flaxseeds and coldwater fish, like sardines, salmon, and mackerel.  Limit how much you eat of the following: ? Canned or prepackaged foods. ? Food that is high in trans fat, such as fried foods. ? Food that is high in saturated fat, such as fatty meat. ? Sweets, desserts, sugary drinks, and other foods with added sugar. ? Full-fat dairy products.  Do not salt foods before eating.  Try to eat at least 2 vegetarian meals each week.  Eat more home-cooked food and less restaurant, buffet, and fast food.  When eating at a restaurant, ask that your food be prepared with less salt or no salt, if possible. What foods are recommended? The items listed may not be a complete list. Talk with your dietitian about   what dietary choices are best for you. Grains Whole-grain or whole-wheat bread. Whole-grain or whole-wheat pasta. Brown rice. Oatmeal. Quinoa. Bulgur. Whole-grain and low-sodium cereals. Pita bread. Low-fat, low-sodium crackers. Whole-wheat flour tortillas. Vegetables Fresh or frozen vegetables (raw, steamed, roasted, or grilled). Low-sodium or reduced-sodium tomato and vegetable juice. Low-sodium or reduced-sodium tomato sauce and tomato paste. Low-sodium or reduced-sodium canned vegetables. Fruits All fresh, dried, or frozen fruit. Canned fruit in natural juice (without  added sugar). Meat and other protein foods Skinless chicken or turkey. Ground chicken or turkey. Pork with fat trimmed off. Fish and seafood. Egg whites. Dried beans, peas, or lentils. Unsalted nuts, nut butters, and seeds. Unsalted canned beans. Lean cuts of beef with fat trimmed off. Low-sodium, lean deli meat. Dairy Low-fat (1%) or fat-free (skim) milk. Fat-free, low-fat, or reduced-fat cheeses. Nonfat, low-sodium ricotta or cottage cheese. Low-fat or nonfat yogurt. Low-fat, low-sodium cheese. Fats and oils Soft margarine without trans fats. Vegetable oil. Low-fat, reduced-fat, or light mayonnaise and salad dressings (reduced-sodium). Canola, safflower, olive, soybean, and sunflower oils. Avocado. Seasoning and other foods Herbs. Spices. Seasoning mixes without salt. Unsalted popcorn and pretzels. Fat-free sweets. What foods are not recommended? The items listed may not be a complete list. Talk with your dietitian about what dietary choices are best for you. Grains Baked goods made with fat, such as croissants, muffins, or some breads. Dry pasta or rice meal packs. Vegetables Creamed or fried vegetables. Vegetables in a cheese sauce. Regular canned vegetables (not low-sodium or reduced-sodium). Regular canned tomato sauce and paste (not low-sodium or reduced-sodium). Regular tomato and vegetable juice (not low-sodium or reduced-sodium). Pickles. Olives. Fruits Canned fruit in a light or heavy syrup. Fried fruit. Fruit in cream or butter sauce. Meat and other protein foods Fatty cuts of meat. Ribs. Fried meat. Bacon. Sausage. Bologna and other processed lunch meats. Salami. Fatback. Hotdogs. Bratwurst. Salted nuts and seeds. Canned beans with added salt. Canned or smoked fish. Whole eggs or egg yolks. Chicken or turkey with skin. Dairy Whole or 2% milk, cream, and half-and-half. Whole or full-fat cream cheese. Whole-fat or sweetened yogurt. Full-fat cheese. Nondairy creamers. Whipped toppings.  Processed cheese and cheese spreads. Fats and oils Butter. Stick margarine. Lard. Shortening. Ghee. Bacon fat. Tropical oils, such as coconut, palm kernel, or palm oil. Seasoning and other foods Salted popcorn and pretzels. Onion salt, garlic salt, seasoned salt, table salt, and sea salt. Worcestershire sauce. Tartar sauce. Barbecue sauce. Teriyaki sauce. Soy sauce, including reduced-sodium. Steak sauce. Canned and packaged gravies. Fish sauce. Oyster sauce. Cocktail sauce. Horseradish that you find on the shelf. Ketchup. Mustard. Meat flavorings and tenderizers. Bouillon cubes. Hot sauce and Tabasco sauce. Premade or packaged marinades. Premade or packaged taco seasonings. Relishes. Regular salad dressings. Where to find more information:  National Heart, Lung, and Blood Institute: www.nhlbi.nih.gov  American Heart Association: www.heart.org Summary  The DASH eating plan is a healthy eating plan that has been shown to reduce high blood pressure (hypertension). It may also reduce your risk for type 2 diabetes, heart disease, and stroke.  With the DASH eating plan, you should limit salt (sodium) intake to 2,300 mg a day. If you have hypertension, you may need to reduce your sodium intake to 1,500 mg a day.  When on the DASH eating plan, aim to eat more fresh fruits and vegetables, whole grains, lean proteins, low-fat dairy, and heart-healthy fats.  Work with your health care provider or diet and nutrition specialist (dietitian) to adjust your eating plan to your   individual calorie needs. This information is not intended to replace advice given to you by your health care provider. Make sure you discuss any questions you have with your health care provider. Document Revised: 05/03/2017 Document Reviewed: 05/14/2016 [employer] Patient Education  2020 [employer]  

## 2020-04-26 ENCOUNTER — Other Ambulatory Visit: Payer: Self-pay

## 2020-04-26 ENCOUNTER — Ambulatory Visit
Admission: RE | Admit: 2020-04-26 | Discharge: 2020-04-26 | Disposition: A | Discharge status: Home / Self Care | Payer: Medicare Other | Source: Ambulatory Visit | Attending: Oncology | Admitting: Oncology

## 2020-04-26 DIAGNOSIS — C7910 Secondary malignant neoplasm of unspecified urinary organs: Secondary | ICD-10-CM | POA: Diagnosis not present

## 2020-04-26 DIAGNOSIS — J432 Centrilobular emphysema: Secondary | ICD-10-CM | POA: Diagnosis not present

## 2020-04-26 DIAGNOSIS — I2510 Atherosclerotic heart disease of native coronary artery without angina pectoris: Secondary | ICD-10-CM | POA: Diagnosis not present

## 2020-04-26 DIAGNOSIS — K469 Unspecified abdominal hernia without obstruction or gangrene: Secondary | ICD-10-CM | POA: Diagnosis not present

## 2020-04-26 DIAGNOSIS — M4316 Spondylolisthesis, lumbar region: Secondary | ICD-10-CM | POA: Diagnosis not present

## 2020-04-26 DIAGNOSIS — Z5112 Encounter for antineoplastic immunotherapy: Secondary | ICD-10-CM

## 2020-04-26 DIAGNOSIS — Z8551 Personal history of malignant neoplasm of bladder: Secondary | ICD-10-CM | POA: Diagnosis not present

## 2020-04-26 DIAGNOSIS — K6389 Other specified diseases of intestine: Secondary | ICD-10-CM | POA: Diagnosis not present

## 2020-04-26 DIAGNOSIS — K439 Ventral hernia without obstruction or gangrene: Secondary | ICD-10-CM | POA: Diagnosis not present

## 2020-04-27 LAB — HM DIABETES EYE EXAM

## 2020-05-03 ENCOUNTER — Inpatient Hospital Stay: Payer: Medicare Other

## 2020-05-03 ENCOUNTER — Inpatient Hospital Stay (HOSPITAL_BASED_OUTPATIENT_CLINIC_OR_DEPARTMENT_OTHER): Payer: Medicare Other | Admitting: Oncology

## 2020-05-03 ENCOUNTER — Other Ambulatory Visit: Payer: Self-pay

## 2020-05-03 ENCOUNTER — Encounter: Payer: Self-pay | Admitting: Oncology

## 2020-05-03 VITALS — BP 138/73 | HR 85 | Resp 16

## 2020-05-03 VITALS — BP 135/79 | HR 87 | Temp 97.8°F | Resp 16 | Wt 205.4 lb

## 2020-05-03 DIAGNOSIS — C679 Malignant neoplasm of bladder, unspecified: Secondary | ICD-10-CM | POA: Diagnosis not present

## 2020-05-03 DIAGNOSIS — Z5112 Encounter for antineoplastic immunotherapy: Secondary | ICD-10-CM

## 2020-05-03 DIAGNOSIS — K5900 Constipation, unspecified: Secondary | ICD-10-CM | POA: Diagnosis not present

## 2020-05-03 DIAGNOSIS — C7910 Secondary malignant neoplasm of unspecified urinary organs: Secondary | ICD-10-CM | POA: Diagnosis not present

## 2020-05-03 DIAGNOSIS — N184 Chronic kidney disease, stage 4 (severe): Secondary | ICD-10-CM | POA: Diagnosis not present

## 2020-05-03 DIAGNOSIS — D631 Anemia in chronic kidney disease: Secondary | ICD-10-CM

## 2020-05-03 DIAGNOSIS — Z86718 Personal history of other venous thrombosis and embolism: Secondary | ICD-10-CM

## 2020-05-03 DIAGNOSIS — K469 Unspecified abdominal hernia without obstruction or gangrene: Secondary | ICD-10-CM

## 2020-05-03 DIAGNOSIS — Z87891 Personal history of nicotine dependence: Secondary | ICD-10-CM | POA: Diagnosis not present

## 2020-05-03 LAB — CBC WITH DIFFERENTIAL/PLATELET
Abs Immature Granulocytes: 0.02 10*3/uL (ref 0.00–0.07)
Basophils Absolute: 0 10*3/uL (ref 0.0–0.1)
Basophils Relative: 1 %
Eosinophils Absolute: 0.2 10*3/uL (ref 0.0–0.5)
Eosinophils Relative: 3 %
HCT: 30 % — ABNORMAL LOW (ref 36.0–46.0)
Hemoglobin: 9.2 g/dL — ABNORMAL LOW (ref 12.0–15.0)
Immature Granulocytes: 0 %
Lymphocytes Relative: 23 %
Lymphs Abs: 1.5 10*3/uL (ref 0.7–4.0)
MCH: 24.3 pg — ABNORMAL LOW (ref 26.0–34.0)
MCHC: 30.7 g/dL (ref 30.0–36.0)
MCV: 79.2 fL — ABNORMAL LOW (ref 80.0–100.0)
Monocytes Absolute: 0.5 10*3/uL (ref 0.1–1.0)
Monocytes Relative: 8 %
Neutro Abs: 4.3 10*3/uL (ref 1.7–7.7)
Neutrophils Relative %: 65 %
Platelets: 212 10*3/uL (ref 150–400)
RBC: 3.79 MIL/uL — ABNORMAL LOW (ref 3.87–5.11)
RDW: 13.7 % (ref 11.5–15.5)
WBC: 6.5 10*3/uL (ref 4.0–10.5)
nRBC: 0 % (ref 0.0–0.2)

## 2020-05-03 LAB — COMPREHENSIVE METABOLIC PANEL
ALT: 11 U/L (ref 0–44)
AST: 18 U/L (ref 15–41)
Albumin: 3.9 g/dL (ref 3.5–5.0)
Alkaline Phosphatase: 99 U/L (ref 38–126)
Anion gap: 10 (ref 5–15)
BUN: 35 mg/dL — ABNORMAL HIGH (ref 8–23)
CO2: 23 mmol/L (ref 22–32)
Calcium: 9.1 mg/dL (ref 8.9–10.3)
Chloride: 106 mmol/L (ref 98–111)
Creatinine, Ser: 1.7 mg/dL — ABNORMAL HIGH (ref 0.44–1.00)
GFR, Estimated: 32 mL/min — ABNORMAL LOW (ref >60)
Glucose, Bld: 193 mg/dL — ABNORMAL HIGH (ref 70–99)
Potassium: 4 mmol/L (ref 3.5–5.1)
Sodium: 139 mmol/L (ref 135–145)
Total Bilirubin: 0.5 mg/dL (ref 0.3–1.2)
Total Protein: 7.7 g/dL (ref 6.5–8.1)

## 2020-05-03 MED ORDER — SODIUM CHLORIDE 0.9 % IV SOLN
200.0000 mg | Freq: Once | INTRAVENOUS | Status: AC
  Administered 2020-05-03: 200 mg via INTRAVENOUS
  Filled 2020-05-03: qty 8

## 2020-05-03 MED ORDER — HEPARIN SOD (PORK) LOCK FLUSH 100 UNIT/ML IV SOLN
500.0000 [IU] | Freq: Once | INTRAVENOUS | Status: AC | PRN
  Administered 2020-05-03: 500 [IU]
  Filled 2020-05-03: qty 5

## 2020-05-03 MED ORDER — HEPARIN SOD (PORK) LOCK FLUSH 100 UNIT/ML IV SOLN
INTRAVENOUS | Status: AC
  Filled 2020-05-03: qty 5

## 2020-05-03 MED ORDER — SODIUM CHLORIDE 0.9 % IV SOLN
Freq: Once | INTRAVENOUS | Status: AC
  Filled 2020-05-03: qty 250

## 2020-05-03 NOTE — Progress Notes (Signed)
Hematology/Oncology follow up note [facility] [phone] [phone]   Patient Care Team: Lane, Rachel Elizabeth, PA-C as PCP - General (Family Medicine) Nice, Keith B, OD (Optometry) Kammi Hechler, MD as Consulting Physician (Hematology and Oncology)  REFERRING PROVIDER: Dr.Sninsky CHIEF COMPLAINTS/REASON FOR VISIT:  Follow up for bladder cancer, anemia.   HISTORY OF PRESENTING ILLNESS:  Angela Russell is a  69 y.o.  female with PMH listed below who was referred to me for evaluation of newly diagnosed bladder cancer. Patient initially presented to emergency room at the end of January 2020 for evaluation of dysuria, hematuria and the left lower quadrant inguinal pain and flank pain.  1/28 2020 CT renal stone study showed suspected irregular wall thickening about the superior bladder, not well assessed due to degree of bladder distention.  Recommend cystoscopy for further evaluation.  No renal stone or obstructive uropathy.  Patient was given IV Rocephin and referred patient for outpatient urology follow-up.  Urine culture was negative.  She again presented to ER after 2 days with similar symptoms.  Patient has 25-pack-year smoking history, quit approximately 20 years ago.  No family history of any urology malignancies 07/03/2018 another CT abdomen pelvis with contrast was done which showed no nephrolithiasis or hydronephrosis is identified.  Bladder is decompressed limiting evaluation.  07/16/2018.urology Dr. Sinskey - cystoscopy and bilateral retrograde pyelogram on 07/16/2018.  Pyelogram did not show any filling defect or abnormalities.  No hydronephrosis.  Ureteral orifice was not involved with tumor.  There is a large 5 cm posterior wall bladder tumor, bullous and sessile appearing.  Patient underwent TURBT.   Pathology: High-grade urothelial carcinoma, invasive into muscularis propria.  Lymphovascular invasion is present.  Carcinoma in situ is also  identified.  Focal squamous differentiation is noted, areas of invasive carcinoma display pleomorphic/sarcomatoid changes.  T2b  08/07/2018 CT without contrast negative.  2 subpleural right upper lobe nodule 2 to 3 mm likely benign. She also had baseline audiometry done.  08/04/2018 ddMVAC x 1 cycle, stopped due to intolerance and AKI.  Patient received 1 cycle of dd MVAC, not able to tolerate due to AKI. Patient then was referred to [facility]  09/22/2018 patient underwent a cystectomy, pathology pT3a N0 Mx.  11 lymph nodes were harvested and was all negative. Invasive urothelia carcinoma, high grade, with sarcomatoid features.   10/14/2018 patient was admitted due to pyelonephritis and a pelvic fluid collection.  Drain was placed. 10/23/2018 drain was removed. Patient has had difficulties getting to her appointments to [facility]. 02/09/2019, patient presented with abdominal pain. CT concerning for small bowel obstruction and increased size of right pelvic fluid collection concerning for cancer recurrence.  Right hydronephrosis to the level of pelvis and enlarged lymph nodes. Patient had JP drain placed with CT guidance to pelvic fluid collection and drained 400 cc amber fluid.  Culture was negative for growth of microorganisms and cytology was negative for malignancy.-JP drain was removed on the day of discharge. CT-guided core biopsy of pelvic lymph node adenopathy was attempted but not successful.  # Right lower extremity DVT, provoked by Transvenous biopsy  02/26/2019 transvenous biopsy by IR unsuccessful transvenous biopsy of the right pelvis mass. Post biopsy acute thrombus in the right external iliac and common femoral vein.  Patient was recommended to start anticoagulation with Xarelto however due to the co-pay, patient is not able to afford the medication. Anticoagulation regimen was switched to Eliquis 5 mg.  Patient reports that she has been taking   it once a day. 03/20/2019 PET showed  FDG avid tissue in the cystectomy bed, retroperitoneal and pelvic lymphadenopathy.  Right common iliac DVT.  # increased right lower extremity swelling.  She was started on Eliquis for anticoagulation by [facility].  We clarified with her pharmacy and she was actually taking Eliquis 2.5 mg twice daily. Right lower extremity swelling has not improved but instead worsened. 03/16/2019 She had ultrasound right lower extremity done which showed persistent extensive proximal right lower extremity DVT.  Anticoagulation regimen has increased to Eliquis 5 mg twice daily.  # establish care with [facility] oncology Dr. [name] for evaluation.  Dr. [name] recommended starting immunotherapy with PD-L1 inhibitor Pembrolizumab 200 mg every 3 weeks.  The sarcomatoid histology may not respond to chemotherapy well, could portend a better chance of response into immunotherapy PET scan after 4 cycles of Keytruda showed single mildly enlarged central mesenteric lymph node in the upper pelvis with SUV 9.6.  No other abnormal hypermetabolic activity was reported.  # #Right lower extremity  femoral vein DVT provoked by transvenous biopsy in the context of cancer recurrence, Status post IV C filter and mechanical thrombectomy.  IVC filter has been removed. She is currently off anticoagulation after she developed hematuria.  # 03/30/2019 Status post IVC filter placement, mechanical thrombectomy.-IVC filter was retrieved in January 2021. # PD-L1 CPS 100%.  # 03/31/2019 started on immunotherapy Keytruda.   INTERVAL HISTORY Angela Russell is a 69 y.o. female who has above history reviewed by me today presents for follow up visit for evaluation form metastatic high-grade urothelial carcinoma of the bladder. Sarcomatoid feature.  Patient has been on immunotherapy and has tolerated well.  Patient has had interval surveillance CT done. She has no new complaints. Continues to have intermittent abdominal discomfort.   She also has  constipation.  Patient is on oral iron supplementation. Patient takes Colace and MiraLAX.  Review of Systems  Constitutional: Negative for appetite change, chills, fatigue and fever.  HENT:   Negative for hearing loss and voice change.   Eyes: Negative for eye problems.  Respiratory: Negative for chest tightness and cough.   Cardiovascular: Negative for chest pain and leg swelling.  Gastrointestinal: Negative for abdominal distention, abdominal pain and blood in stool.       Abdominal discomfort.   Endocrine: Negative for hot flashes.  Genitourinary: Negative for difficulty urinating and frequency.   Musculoskeletal: Negative for arthralgias.  Skin: Negative for itching and rash.  Neurological: Positive for numbness. Negative for extremity weakness.  Hematological: Negative for adenopathy.  Psychiatric/Behavioral: Negative for confusion. The patient is not nervous/anxious.     MEDICAL HISTORY:  Past Medical History:  Diagnosis Date  . Carotid artery plaque, right 01/2014  . CKD (chronic kidney disease)    stage 2-3  . Diabetes mellitus without complication ([place])   . DM (diabetes mellitus), type 2, uncontrolled ([place])   . Hyperlipidemia   . Hypertension   . Hypochromic microcytic anemia    mild  . Metastatic urothelial carcinoma ([place]) 03/23/2019  . Osteoporosis   . Renal insufficiency   . Rotator cuff tendonitis, right     SURGICAL HISTORY: Past Surgical History:  Procedure Laterality Date  . ABDOMINAL HYSTERECTOMY  2000   due to bleeding and fibroids, partial- still has ovaries  . CYSTOSCOPY W/ RETROGRADES Bilateral 07/16/2018   Procedure: CYSTOSCOPY WITH RETROGRADE PYELOGRAM;  Surgeon: Billey Co, MD;  Location: [facility];  Service: Urology;  Laterality: Bilateral;  . IVC FILTER INSERTION N/A  03/30/2019   Procedure: IVC FILTER INSERTION;  Surgeon: Algernon Huxley, MD;  Location: [place] [facility];  Service: Cardiovascular;  Laterality: N/A;  . IVC FILTER REMOVAL N/A  06/15/2019   Procedure: IVC FILTER REMOVAL;  Surgeon: Algernon Huxley, MD;  Location: [place] [facility];  Service: Cardiovascular;  Laterality: N/A;  . KNEE SURGERY Left 03/17/2013   torn meniscus  . PERIPHERAL VASCULAR THROMBECTOMY Right 03/30/2019   Procedure: PERIPHERAL VASCULAR THROMBECTOMY;  Surgeon: Algernon Huxley, MD;  Location: [place] [facility];  Service: Cardiovascular;  Laterality: Right;  . PORTA CATH INSERTION N/A 08/06/2018   Procedure: PORTA CATH INSERTION;  Surgeon: Algernon Huxley, MD;  Location: [place] [facility];  Service: Cardiovascular;  Laterality: N/A;  . TRANSURETHRAL RESECTION OF BLADDER TUMOR N/A 07/16/2018   Procedure: TRANSURETHRAL RESECTION OF BLADDER TUMOR (TURBT);  Surgeon: Billey Co, MD;  Location: [facility];  Service: Urology;  Laterality: N/A;    SOCIAL HISTORY: Social History   Socioeconomic History  . Marital status: Single    Spouse name: Not on file  . Number of children: 1  . Years of education: Not on file  . Highest education level: 10th grade  Occupational History  . Not on file  Tobacco Use  . Smoking status: Former Smoker    Quit date: 06/05/1991    Years since quitting: 28.9  . Smokeless tobacco: Never Used  Vaping Use  . Vaping Use: Never used  Substance and Sexual Activity  . Alcohol use: No  . Drug use: No  . Sexual activity: Never  Other Topics Concern  . Not on file  Social History Narrative   Working full time   Social Determinants of Radio broadcast assistant Strain:   . Difficulty of Paying Living Expenses: Not on file  Food Insecurity:   . Worried About Charity fundraiser in the Last Year: Not on file  . Ran Out of Food in the Last Year: Not on file  Transportation Needs:   . Lack of Transportation (Medical): Not on file  . Lack of Transportation (Non-Medical): Not on file  Physical Activity:   . Days of Exercise per Week: Not on file  . Minutes of Exercise per Session: Not on file  Stress:   . Feeling of  Stress : Not on file  Social Connections:   . Frequency of Communication with Friends and Family: Not on file  . Frequency of Social Gatherings with Friends and Family: Not on file  . Attends Religious Services: Not on file  . Active Member of Clubs or Organizations: Not on file  . Attends Archivist Meetings: Not on file  . Marital Status: Not on file  Intimate Partner Violence:   . Fear of Current or Ex-Partner: Not on file  . Emotionally Abused: Not on file  . Physically Abused: Not on file  . Sexually Abused: Not on file    FAMILY HISTORY: Family History  Problem Relation Age of Onset  . Heart disease Mother   . Heart attack Mother   . Arthritis Father   . Diabetes Brother     ALLERGIES:  has No Known Allergies.  MEDICATIONS:  Current Outpatient Medications  Medication Sig Dispense Refill  . amLODipine (NORVASC) 5 MG tablet Take 1 tablet (5 mg total) by mouth daily. 90 tablet 1  . diclofenac sodium (VOLTAREN) 1 % GEL Apply 2 g topically 4 (four) times daily. (Patient taking differently: Apply 2  g topically as needed. ) 100 g 2  . docusate sodium (COLACE) 100 MG capsule Take 1 capsule (100 mg total) by mouth daily. 90 capsule 0  . losartan (COZAAR) 25 MG tablet Take 1 tablet (25 mg total) by mouth daily. 90 tablet 1  . polyethylene glycol (MIRALAX / GLYCOLAX) 17 g packet Take 17 g by mouth daily as needed for mild constipation. 14 each 0  . rosuvastatin (CRESTOR) 40 MG tablet Take 1 tablet (40 mg total) by mouth daily. 90 tablet 1   No current facility-administered medications for this visit.     PHYSICAL EXAMINATION: ECOG PERFORMANCE STATUS: 1 - Symptomatic but completely ambulatory Vitals:   05/03/20 0936  BP: 135/79  Pulse: 87  Resp: 16  Temp: 97.8 F (36.6 C)   Filed Weights   05/03/20 0936  Weight: 205 lb 6.4 oz (93.2 kg)    Physical Exam Constitutional:      General: She is not in acute distress.    Comments: Walk independently  HENT:      Head: Normocephalic and atraumatic.  Eyes:     General: No scleral icterus.    Pupils: Pupils are equal, round, and reactive to light.  Cardiovascular:     Rate and Rhythm: Normal rate and regular rhythm.     Heart sounds: Normal heart sounds.  Pulmonary:     Effort: Pulmonary effort is normal. No respiratory distress.     Breath sounds: No wheezing.  Abdominal:     General: Bowel sounds are normal. There is no distension.     Palpations: Abdomen is soft. There is no mass.     Comments: Ureterostomy,   Musculoskeletal:        General: No deformity. Normal range of motion.     Cervical back: Normal range of motion and neck supple.  Skin:    General: Skin is warm and dry.     Coloration: Skin is not pale.     Findings: No erythema or rash.  Neurological:     Mental Status: She is alert and oriented to person, place, and time. Mental status is at baseline.     Cranial Nerves: No cranial nerve deficit.     Coordination: Coordination normal.  Psychiatric:        Mood and Affect: Mood normal.     RADIOGRAPHIC STUDIES: I have personally reviewed the radiological images as listed and agreed with the findings in the report.  CMP Latest Ref Rng & Units 05/03/2020  Glucose 70 - 99 mg/dL 193(H)  BUN 8 - 23 mg/dL 35(H)  Creatinine 0.44 - 1.00 mg/dL 1.70(H)  Sodium 135 - 145 mmol/L 139  Potassium 3.5 - 5.1 mmol/L 4.0  Chloride 98 - 111 mmol/L 106  CO2 22 - 32 mmol/L 23  Calcium 8.9 - 10.3 mg/dL 9.1  Total Protein 6.5 - 8.1 g/dL 7.7  Total Bilirubin 0.3 - 1.2 mg/dL 0.5  Alkaline Phos 38 - 126 U/L 99  AST 15 - 41 U/L 18  ALT 0 - 44 U/L 11   CBC Latest Ref Rng & Units 05/03/2020  WBC 4.0 - 10.5 K/uL 6.5  Hemoglobin 12.0 - 15.0 g/dL 9.2(L)  Hematocrit 36 - 46 % 30.0(L)  Platelets 150 - 400 K/uL 212    LABORATORY DATA:  I have reviewed the data as listed Lab Results  Component Value Date   WBC 6.5 05/03/2020   HGB 9.2 (L) 05/03/2020   HCT 30.0 (L) 05/03/2020   MCV 79.2 (  L)  05/03/2020   PLT 212 05/03/2020   Recent Labs    02/09/20 0832 02/09/20 0832 02/16/20 1307 02/16/20 1307 03/01/20 1301 03/01/20 1301 03/22/20 0846 04/12/20 0846 05/03/20 0848  NA 138   < > 139   < > 140   < > 138 138 139  K 4.4   < > 4.6   < > 4.6   < > 4.0 4.0 4.0  CL 104   < > 107   < > 108   < > 107 105 106  CO2 21*   < > 20*   < > 22   < > $R'22 22 23  'tw$ GLUCOSE 228*   < > 163*   < > 190*   < > 163* 162* 193*  BUN 41*   < > 49*   < > 33*   < > 31* 26* 35*  CREATININE 2.35*   < > 2.41*   < > 2.08*   < > 1.66* 1.66* 1.70*  CALCIUM 9.0   < > 9.3   < > 9.3   < > 8.9 8.8* 9.1  GFRNONAA 20*   < > 20*   < > 24*  --  31* 33* 32*  GFRAA 24*  --  23*  --  27*  --   --   --   --   PROT 7.9   < > 8.6*   < > 7.8   < > 7.5 7.5 7.7  ALBUMIN 4.3   < > 4.5   < > 4.3   < > 4.0 4.1 3.9  AST 27   < > 22   < > 23   < > $R'19 22 18  'zm$ ALT 14   < > 14   < > 13   < > $R'11 11 11  'Rv$ ALKPHOS 100   < > 86   < > 87   < > 93 86 99  BILITOT 0.6   < > 0.5   < > 0.5   < > 0.6 0.4 0.5   < > = values in this interval not displayed.   Iron/TIBC/Ferritin/ %Sat    Component Value Date/Time   IRON 73 11/17/2019 0936   IRON 26 (L) 02/04/2019 1309   TIBC 316 11/17/2019 0936   TIBC 251 02/04/2019 1309   FERRITIN 256 11/17/2019 0936   FERRITIN 409 (H) 02/04/2019 1309   IRONPCTSAT 23 11/17/2019 0936   IRONPCTSAT 10 (L) 02/04/2019 1309    RADIOGRAPHIC STUDIES: I have personally reviewed the radiological images as listed and agreed with the findings in the report. US RENAL  Result Date: 02/21/2020 CLINICAL DATA:  Elevated creatinine EXAM: RENAL / URINARY TRACT ULTRASOUND COMPLETE COMPARISON:  None. FINDINGS: Right Kidney: Renal measurements: 8.8 x 4.7 x 4.3 cm = volume: 94 mL. The renal cortex is slightly echogenic. No shadowing stones are seen. No mass or hydronephrosis visualized. Left Kidney: Renal measurements: 8.8 x 5.4 x 4.3 cm = volume: 107 mL. The renal cortex is slightly echogenic. No shadowing stones are seen. No  mass or hydronephrosis visualized. Bladder: Surgically removed Other: None. IMPRESSION: Diffusely increased parenchymal echogenicity, consistent with medical renal disease. Electronically Signed   By: Prudencio Pair M.D.   On: 02/21/2020 15:06   CT CHEST ABDOMEN PELVIS WO CONTRAST  Result Date: 04/27/2020 CLINICAL DATA:  Bladder cancer restaging. Discomfort in the vicinity of the ileal conduit ostomy. EXAM: CT CHEST, ABDOMEN AND PELVIS WITHOUT CONTRAST TECHNIQUE: Multidetector CT imaging  of the chest, abdomen and pelvis was performed following the standard protocol without IV contrast. COMPARISON:  01/06/2020 FINDINGS: CT CHEST FINDINGS Cardiovascular: Right Port-A-Cath tip: Right atrium. Coronary, aortic arch, and branch vessel atherosclerotic vascular disease. Aberrant right subclavian artery passes behind the esophagus. Mediastinum/Nodes: Contrast medium in the distal esophagus suggesting dysmotility or reflux. Lungs/Pleura: Centrilobular emphysema. Stable 3 mm right upper lobe pulmonary nodule on image 57 of series 3, no change from 08/07/2018. Speckled linear calcifications in the right lower lobe on image 117 of series 3, no change from 08/07/2018. No compelling findings of metastatic disease to the lungs. Musculoskeletal: Bilateral degenerative glenohumeral arthropathy, right greater than left. Thoracic spondylosis. CT ABDOMEN PELVIS FINDINGS Hepatobiliary: Unremarkable Pancreas: Unremarkable Spleen: Unremarkable Adrenals/Urinary Tract: Both adrenal glands appear normal. Fullness of both collecting systems without overt hydronephrosis. Mild right hydroureter. Ileal conduit noted. Cystectomy. Stomach/Bowel: Along the upper margin of the ileal conduit, there is a peristomal Richter hernia containing a small margin of adjacent small bowel. This is associated with wall thickening in an approximately 7 cm segment of bowel adjacent to the Richter hernia. There is adjacent abnormal stranding in the mesentery and  omentum. Prominent stool throughout the colon favors constipation. Vascular/Lymphatic: Aortoiliac atherosclerotic vascular disease. Stent in the right external iliac vein. Reproductive: Uterus absent. Adnexa difficult to separate from the adjacent pelvic sidewall, possible mild scarring along the region of lymph node dissection, but no progressive density is identified along the pelvic sidewalls or adnexa. Other: Small amount of pelvic ascites, similar to prior. Musculoskeletal: Grade 1 degenerative anterolisthesis of L4 on L5. The L5 vertebra is partially sacralized/transitional. Degenerative arthropathy of the hips, left greater than right. IMPRESSION: 1. Along the upper margin of the ileal conduit, there is a peristomal Richter hernia containing a small margin of adjacent small bowel. This is associated with wall thickening in an approximately 7 cm segment of bowel adjacent to the Richter hernia. There is adjacent abnormal stranding in the mesentery and omentum. 2. Small amount of pelvic ascites, similar to prior. 3. Other imaging findings of potential clinical significance: Coronary atherosclerosis. Aberrant right subclavian artery passes behind the esophagus. Prominent stool throughout the colon favors constipation. Degenerative arthropathy of the hips, left greater than right. Grade 1 degenerative anterolisthesis of L4 on L5. 4. Emphysema and aortic atherosclerosis. Aortic Atherosclerosis (ICD10-I70.0) and Emphysema (ICD10-J43.9). Electronically Signed   By: Van Clines M.D.   On: 04/27/2020 10:57     ASSESSMENT & PLAN:  1. Metastatic urothelial carcinoma ([place])   2. Encounter for antineoplastic immunotherapy   3. Anemia in stage 4 chronic kidney disease ([place])   4. History of DVT of lower extremity   5. Abdominal hernia without obstruction and without gangrene, recurrence not specified, unspecified hernia type   Cancer Staging Bladder carcinoma [facility]) Staging form: Urinary Bladder, AJCC 8th  Edition - Clinical stage from 08/01/2018: Stage IVA (cTX, cN3, cM1a) - Signed by [name], MD on 04/21/2019  #Metastatic urothelial carcinoma of bladder, sarcomatoid features pelvic sidewall mass showed metastatic carcinoma consistent with involvement by urothelial carcinoma. 04/26/2020 CT chest abdomen pelvis was reviewed and discussed with patient. No definitive finding of disease recurrence or metastasis.  Small amount of ascites similar to prior. There is a parastomal hernia containing a small margin of adjacent small bowel.  This is associated with wall thickening in approximately 7 cm segment of bowel adjacent to the hernia.  There is adjacent abnormal stranding of the mesentery and omentum. Chronic coronary atherosclerosis.  Prominent stool throughout  the colon.  Degenerative changes.  Emphysema and aortic atherosclerosis. Image findings were reviewed with patient. Labs are reviewed and discussed with patient. Proceed with maintenance Keytruda today.  #Hernia associated with parastomal hernia.  I discussed with surgery. Wondering if the hernia is related to her chronic abdominal pain.  #Chronic kidney disease,  Creatinine has been stable.  Continue follow-up with primary care provider.  #Chronic anemia secondary to stage III CKD.  Hemoglobin remained stable but low. I discussed with her again about IV Venofer treatments and she adamantly declines. She prefers to continue oral iron supplementation.  She notes that oral iron supplementation may contribute to her constipation.  #Constipation, recommend patient to continue current bowel regimen.  #Follow-up for evaluation prior to next [facility] treatment.  Patient opted to postpone her next Keytruda treatment for 1 week due to December holiday schedules. All questions were answered. The patient knows to call the clinic with any problems questions or concerns.  [name], MD, PhD Hematology Oncology [facility] at [facility] Pager- [phone] 05/03/20

## 2020-05-03 NOTE — Progress Notes (Signed)
Patient tolerated chemo infusion well, no concerns voiced. Patient discharged. Vitals stable.

## 2020-05-03 NOTE — Progress Notes (Signed)
Patient denies new problems/concerns today.   °

## 2020-05-05 ENCOUNTER — Telehealth: Payer: Self-pay

## 2020-05-05 NOTE — Telephone Encounter (Signed)
-----   Message from Evelina Dun, RN sent at 05/05/2020 11:55 AM EST -----  ----- Message ----- From: [name], MD Sent: 05/04/2020   4:03 PM EST To: Evelina Dun, RN, Vanice Sarah, CMA  Cherylynn Ridges on yesterday's schedule thanks.  ----- Message ----- From: Vanice Sarah, CMA Sent: 05/04/2020  12:11 PM EST To: Evelina Dun, RN, [name], MD  Dr. Tasia Catchings, who is the patient? ----- Message ----- From: [name], MD Sent: 05/04/2020   8:45 AM EST To: Evelina Dun, RN, Vanice Sarah, CMA  Please contact [facility] Dr.Gingrich, Dellis Filbert Rae's office and ask if Dr.Gingrich could review her most recent CT results ( if not available to them, please fax a copy) . My question is regarding to her hernia, if Dr.Gingrich wants to see her for evaluation or if she should get evaluation by general surgery. Call me if questions.

## 2020-05-05 NOTE — Telephone Encounter (Signed)
Spoke to Triage nurse at [facility], who states that Dr. Matilde Haymaker retired in September, but she will relay message to assigned provider (Dr. [name]). Provided Dr. Collie Siad number for response. Also faxed CT results per request.   Ph: [phone] Fax: [phone]

## 2020-05-16 NOTE — Telephone Encounter (Signed)
Received fax from [facility] stating: please refer to general surgery.   Dr. Tasia Catchings, do you have a surgeon preference?

## 2020-05-18 ENCOUNTER — Other Ambulatory Visit: Payer: Self-pay

## 2020-05-18 DIAGNOSIS — C7910 Secondary malignant neoplasm of unspecified urinary organs: Secondary | ICD-10-CM

## 2020-05-18 NOTE — Telephone Encounter (Signed)
referral for evaluation of hernia faxed to Dr. Cheryln Manly @ [facility]   [phone]  fax: [phone]

## 2020-05-26 ENCOUNTER — Telehealth: Payer: Self-pay

## 2020-05-26 NOTE — Telephone Encounter (Signed)
Received fax from [facility] gen and acute care surg (Dr. Cheryln Manly) comfirming that they received referral and requesting the most recent CT to be powershared to [facility] radiology department. They will need images before they can get her scheduled. Request forwarded to our radiology department.   Arnoldo Hooker (nurse for Dr. Cheryln Manly) ph: [phone] fax: [phone]

## 2020-05-30 ENCOUNTER — Other Ambulatory Visit: Payer: Self-pay

## 2020-05-30 DIAGNOSIS — C7910 Secondary malignant neoplasm of unspecified urinary organs: Secondary | ICD-10-CM

## 2020-05-31 ENCOUNTER — Encounter: Payer: Self-pay | Admitting: Oncology

## 2020-05-31 ENCOUNTER — Other Ambulatory Visit: Payer: Self-pay

## 2020-05-31 ENCOUNTER — Inpatient Hospital Stay: Payer: Medicare Other

## 2020-05-31 ENCOUNTER — Inpatient Hospital Stay: Payer: Medicare Other | Attending: Oncology

## 2020-05-31 ENCOUNTER — Inpatient Hospital Stay (HOSPITAL_BASED_OUTPATIENT_CLINIC_OR_DEPARTMENT_OTHER): Payer: Medicare Other | Admitting: Oncology

## 2020-05-31 VITALS — BP 124/77 | HR 85 | Temp 97.4°F | Resp 16 | Wt 206.4 lb

## 2020-05-31 DIAGNOSIS — C7910 Secondary malignant neoplasm of unspecified urinary organs: Secondary | ICD-10-CM

## 2020-05-31 DIAGNOSIS — Z5112 Encounter for antineoplastic immunotherapy: Secondary | ICD-10-CM | POA: Diagnosis not present

## 2020-05-31 DIAGNOSIS — C679 Malignant neoplasm of bladder, unspecified: Secondary | ICD-10-CM | POA: Diagnosis not present

## 2020-05-31 DIAGNOSIS — K435 Parastomal hernia without obstruction or  gangrene: Secondary | ICD-10-CM

## 2020-05-31 DIAGNOSIS — D631 Anemia in chronic kidney disease: Secondary | ICD-10-CM | POA: Diagnosis not present

## 2020-05-31 DIAGNOSIS — M810 Age-related osteoporosis without current pathological fracture: Secondary | ICD-10-CM | POA: Diagnosis not present

## 2020-05-31 DIAGNOSIS — K5900 Constipation, unspecified: Secondary | ICD-10-CM | POA: Insufficient documentation

## 2020-05-31 DIAGNOSIS — R109 Unspecified abdominal pain: Secondary | ICD-10-CM | POA: Diagnosis not present

## 2020-05-31 DIAGNOSIS — E785 Hyperlipidemia, unspecified: Secondary | ICD-10-CM | POA: Diagnosis not present

## 2020-05-31 DIAGNOSIS — N184 Chronic kidney disease, stage 4 (severe): Secondary | ICD-10-CM

## 2020-05-31 DIAGNOSIS — Z79899 Other long term (current) drug therapy: Secondary | ICD-10-CM | POA: Insufficient documentation

## 2020-05-31 DIAGNOSIS — I129 Hypertensive chronic kidney disease with stage 1 through stage 4 chronic kidney disease, or unspecified chronic kidney disease: Secondary | ICD-10-CM | POA: Insufficient documentation

## 2020-05-31 DIAGNOSIS — Z87891 Personal history of nicotine dependence: Secondary | ICD-10-CM | POA: Insufficient documentation

## 2020-05-31 DIAGNOSIS — E1165 Type 2 diabetes mellitus with hyperglycemia: Secondary | ICD-10-CM | POA: Diagnosis not present

## 2020-05-31 DIAGNOSIS — R911 Solitary pulmonary nodule: Secondary | ICD-10-CM | POA: Diagnosis not present

## 2020-05-31 LAB — CBC WITH DIFFERENTIAL/PLATELET
Abs Immature Granulocytes: 0.01 10*3/uL (ref 0.00–0.07)
Basophils Absolute: 0.1 10*3/uL (ref 0.0–0.1)
Basophils Relative: 1 %
Eosinophils Absolute: 0.2 10*3/uL (ref 0.0–0.5)
Eosinophils Relative: 2 %
HCT: 30.7 % — ABNORMAL LOW (ref 36.0–46.0)
Hemoglobin: 9.3 g/dL — ABNORMAL LOW (ref 12.0–15.0)
Immature Granulocytes: 0 %
Lymphocytes Relative: 23 %
Lymphs Abs: 1.5 10*3/uL (ref 0.7–4.0)
MCH: 23.7 pg — ABNORMAL LOW (ref 26.0–34.0)
MCHC: 30.3 g/dL (ref 30.0–36.0)
MCV: 78.1 fL — ABNORMAL LOW (ref 80.0–100.0)
Monocytes Absolute: 0.4 10*3/uL (ref 0.1–1.0)
Monocytes Relative: 7 %
Neutro Abs: 4.3 10*3/uL (ref 1.7–7.7)
Neutrophils Relative %: 67 %
Platelets: 229 10*3/uL (ref 150–400)
RBC: 3.93 MIL/uL (ref 3.87–5.11)
RDW: 13.7 % (ref 11.5–15.5)
WBC: 6.4 10*3/uL (ref 4.0–10.5)
nRBC: 0 % (ref 0.0–0.2)

## 2020-05-31 LAB — COMPREHENSIVE METABOLIC PANEL
ALT: 9 U/L (ref 0–44)
AST: 19 U/L (ref 15–41)
Albumin: 4 g/dL (ref 3.5–5.0)
Alkaline Phosphatase: 94 U/L (ref 38–126)
Anion gap: 9 (ref 5–15)
BUN: 40 mg/dL — ABNORMAL HIGH (ref 8–23)
CO2: 23 mmol/L (ref 22–32)
Calcium: 9.2 mg/dL (ref 8.9–10.3)
Chloride: 108 mmol/L (ref 98–111)
Creatinine, Ser: 1.49 mg/dL — ABNORMAL HIGH (ref 0.44–1.00)
GFR, Estimated: 38 mL/min — ABNORMAL LOW (ref >60)
Glucose, Bld: 188 mg/dL — ABNORMAL HIGH (ref 70–99)
Potassium: 3.9 mmol/L (ref 3.5–5.1)
Sodium: 140 mmol/L (ref 135–145)
Total Bilirubin: 0.5 mg/dL (ref 0.3–1.2)
Total Protein: 7.7 g/dL (ref 6.5–8.1)

## 2020-05-31 LAB — TSH: TSH: 2.127 u[IU]/mL (ref 0.350–4.500)

## 2020-05-31 MED ORDER — SODIUM CHLORIDE 0.9 % IV SOLN
200.0000 mg | Freq: Once | INTRAVENOUS | Status: AC
  Administered 2020-05-31: 200 mg via INTRAVENOUS
  Filled 2020-05-31: qty 8

## 2020-05-31 MED ORDER — SODIUM CHLORIDE 0.9% FLUSH
10.0000 mL | Freq: Once | INTRAVENOUS | Status: AC
  Administered 2020-05-31: 10 mL via INTRAVENOUS
  Filled 2020-05-31: qty 10

## 2020-05-31 MED ORDER — HEPARIN SOD (PORK) LOCK FLUSH 100 UNIT/ML IV SOLN
500.0000 [IU] | Freq: Once | INTRAVENOUS | Status: AC | PRN
  Administered 2020-05-31: 500 [IU]
  Filled 2020-05-31: qty 5

## 2020-05-31 MED ORDER — HEPARIN SOD (PORK) LOCK FLUSH 100 UNIT/ML IV SOLN
INTRAVENOUS | Status: AC
  Filled 2020-05-31: qty 5

## 2020-05-31 MED ORDER — SODIUM CHLORIDE 0.9 % IV SOLN
Freq: Once | INTRAVENOUS | Status: AC
  Filled 2020-05-31: qty 250

## 2020-05-31 NOTE — Progress Notes (Signed)
Hematology/Oncology follow up note [facility] [phone] [phone]   Patient Care Team: Lane, Rachel Elizabeth, PA-C as PCP - General (Family Medicine) Nice, Keith B, OD (Optometry) Rickeya Manus, MD as Consulting Physician (Hematology and Oncology)  REFERRING PROVIDER: Dr.Sninsky CHIEF COMPLAINTS/REASON FOR VISIT:  Follow up for bladder cancer, anemia.   HISTORY OF PRESENTING ILLNESS:  Angela Russell is a  69 y.o.  female with PMH listed below who was referred to me for evaluation of newly diagnosed bladder cancer. Patient initially presented to emergency room at the end of January 2020 for evaluation of dysuria, hematuria and the left lower quadrant inguinal pain and flank pain.  1/28 2020 CT renal stone study showed suspected irregular wall thickening about the superior bladder, not well assessed due to degree of bladder distention.  Recommend cystoscopy for further evaluation.  No renal stone or obstructive uropathy.  Patient was given IV Rocephin and referred patient for outpatient urology follow-up.  Urine culture was negative.  She again presented to ER after 2 days with similar symptoms.  Patient has 25-pack-year smoking history, quit approximately 20 years ago.  No family history of any urology malignancies 07/03/2018 another CT abdomen pelvis with contrast was done which showed no nephrolithiasis or hydronephrosis is identified.  Bladder is decompressed limiting evaluation.  07/16/2018.urology Dr. Sinskey - cystoscopy and bilateral retrograde pyelogram on 07/16/2018.  Pyelogram did not show any filling defect or abnormalities.  No hydronephrosis.  Ureteral orifice was not involved with tumor.  There is a large 5 cm posterior wall bladder tumor, bullous and sessile appearing.  Patient underwent TURBT.   Pathology: High-grade urothelial carcinoma, invasive into muscularis propria.  Lymphovascular invasion is present.  Carcinoma in situ is also  identified.  Focal squamous differentiation is noted, areas of invasive carcinoma display pleomorphic/sarcomatoid changes.  T2b  08/07/2018 CT without contrast negative.  2 subpleural right upper lobe nodule 2 to 3 mm likely benign. She also had baseline audiometry done.  08/04/2018 ddMVAC x 1 cycle, stopped due to intolerance and AKI.  Patient received 1 cycle of dd MVAC, not able to tolerate due to AKI. Patient then was referred to [facility]  09/22/2018 patient underwent a cystectomy, pathology pT3a N0 Mx.  11 lymph nodes were harvested and was all negative. Invasive urothelia carcinoma, high grade, with sarcomatoid features.   10/14/2018 patient was admitted due to pyelonephritis and a pelvic fluid collection.  Drain was placed. 10/23/2018 drain was removed. Patient has had difficulties getting to her appointments to [facility]. 02/09/2019, patient presented with abdominal pain. CT concerning for small bowel obstruction and increased size of right pelvic fluid collection concerning for cancer recurrence.  Right hydronephrosis to the level of pelvis and enlarged lymph nodes. Patient had JP drain placed with CT guidance to pelvic fluid collection and drained 400 cc amber fluid.  Culture was negative for growth of microorganisms and cytology was negative for malignancy.-JP drain was removed on the day of discharge. CT-guided core biopsy of pelvic lymph node adenopathy was attempted but not successful.  # Right lower extremity DVT, provoked by Transvenous biopsy  02/26/2019 transvenous biopsy by IR unsuccessful transvenous biopsy of the right pelvis mass. Post biopsy acute thrombus in the right external iliac and common femoral vein.  Patient was recommended to start anticoagulation with Xarelto however due to the co-pay, patient is not able to afford the medication. Anticoagulation regimen was switched to Eliquis 5 mg.  Patient reports that she has been taking   it once a day. 03/20/2019 PET showed  FDG avid tissue in the cystectomy bed, retroperitoneal and pelvic lymphadenopathy.  Right common iliac DVT.  # increased right lower extremity swelling.  She was started on Eliquis for anticoagulation by [facility].  We clarified with her pharmacy and she was actually taking Eliquis 2.5 mg twice daily. Right lower extremity swelling has not improved but instead worsened. 03/16/2019 She had ultrasound right lower extremity done which showed persistent extensive proximal right lower extremity DVT.  Anticoagulation regimen has increased to Eliquis 5 mg twice daily.  # establish care with [facility] oncology Dr. [name] for evaluation.  Dr. [name] recommended starting immunotherapy with PD-L1 inhibitor Pembrolizumab 200 mg every 3 weeks.  The sarcomatoid histology may not respond to chemotherapy well, could portend a better chance of response into immunotherapy PET scan after 4 cycles of Keytruda showed single mildly enlarged central mesenteric lymph node in the upper pelvis with SUV 9.6.  No other abnormal hypermetabolic activity was reported.  # #Right lower extremity  femoral vein DVT provoked by transvenous biopsy in the context of cancer recurrence, Status post IV C filter and mechanical thrombectomy.  IVC filter has been removed. She is currently off anticoagulation after she developed hematuria.  # 03/30/2019 Status post IVC filter placement, mechanical thrombectomy.-IVC filter was retrieved in January 2021. # PD-L1 CPS 100%.  # 03/31/2019 started on immunotherapy Keytruda.  # 04/26/2020 CT chest abdomen pelvis was reviewed and discussed with patient. No definitive finding of disease recurrence or metastasis.  Small amount of ascites similar to prior. There is a parastomal hernia containing a small margin of adjacent small bowel.  This is associated with wall thickening in approximately 7 cm segment of bowel adjacent to the hernia.  There is adjacent abnormal stranding of the mesentery and  omentum.   INTERVAL HISTORY Angela Russell is a 69 y.o. female who has above history reviewed by me today presents for follow up visit for evaluation form metastatic high-grade urothelial carcinoma of the bladder. Sarcomatoid feature.  Patient has been on immunotherapy and has tolerated well.  Patient has had interval surveillance CT done. She has no new complaints.  Continues to have chronic abdominal pain/discomfort, 3 out of 10.  No exacerbating or alleviating factors.  No nausea vomiting diarrhea.  Chronic constipation for which she takes Colace and MiraLAX.   Review of Systems  Constitutional: Negative for appetite change, chills, fatigue and fever.  HENT:   Negative for hearing loss and voice change.   Eyes: Negative for eye problems.  Respiratory: Negative for chest tightness and cough.   Cardiovascular: Negative for chest pain and leg swelling.  Gastrointestinal: Negative for abdominal distention, abdominal pain and blood in stool.       Abdominal discomfort.   Endocrine: Negative for hot flashes.  Genitourinary: Negative for difficulty urinating and frequency.   Musculoskeletal: Negative for arthralgias.  Skin: Negative for itching and rash.  Neurological: Positive for numbness. Negative for extremity weakness.  Hematological: Negative for adenopathy.  Psychiatric/Behavioral: Negative for confusion. The patient is not nervous/anxious.     MEDICAL HISTORY:  Past Medical History:  Diagnosis Date  . Carotid artery plaque, right 01/2014  . CKD (chronic kidney disease)    stage 2-3  . Diabetes mellitus without complication ([place])   . DM (diabetes mellitus), type 2, uncontrolled ([place])   . Hyperlipidemia   . Hypertension   . Hypochromic microcytic anemia    mild  . Metastatic urothelial carcinoma ([place]) 03/23/2019  .  Osteoporosis   . Renal insufficiency   . Rotator cuff tendonitis, right     SURGICAL HISTORY: Past Surgical History:  Procedure Laterality Date  .  ABDOMINAL HYSTERECTOMY  2000   due to bleeding and fibroids, partial- still has ovaries  . CYSTOSCOPY W/ RETROGRADES Bilateral 07/16/2018   Procedure: CYSTOSCOPY WITH RETROGRADE PYELOGRAM;  Surgeon: Billey Co, MD;  Location: [facility] ORS;  Service: Urology;  Laterality: Bilateral;  . IVC FILTER INSERTION N/A 03/30/2019   Procedure: IVC FILTER INSERTION;  Surgeon: Algernon Huxley, MD;  Location: [place] [facility];  Service: Cardiovascular;  Laterality: N/A;  . IVC FILTER REMOVAL N/A 06/15/2019   Procedure: IVC FILTER REMOVAL;  Surgeon: Algernon Huxley, MD;  Location: [place] [facility];  Service: Cardiovascular;  Laterality: N/A;  . KNEE SURGERY Left 03/17/2013   torn meniscus  . PERIPHERAL VASCULAR THROMBECTOMY Right 03/30/2019   Procedure: PERIPHERAL VASCULAR THROMBECTOMY;  Surgeon: Algernon Huxley, MD;  Location: [place] [facility];  Service: Cardiovascular;  Laterality: Right;  . PORTA CATH INSERTION N/A 08/06/2018   Procedure: PORTA CATH INSERTION;  Surgeon: Algernon Huxley, MD;  Location: [place] [facility];  Service: Cardiovascular;  Laterality: N/A;  . TRANSURETHRAL RESECTION OF BLADDER TUMOR N/A 07/16/2018   Procedure: TRANSURETHRAL RESECTION OF BLADDER TUMOR (TURBT);  Surgeon: Billey Co, MD;  Location: [facility] OR[facility];  Service: Urology;  Laterality: N/A;    SOCIAL HISTORY: Social History   Socioeconomic History  . Marital status: Single    Spouse name: Not on file  . Number of children: 1  . Years of education: Not on file  . Highest education level: 10th grade  Occupational History  . Not on file  Tobacco Use  . Smoking status: Former Smoker    Quit date: 06/05/1991    Years since quitting: 29.0  . Smokeless tobacco: Never Used  Vaping Use  . Vaping Use: Never used  Substance and Sexual Activity  . Alcohol use: No  . Drug use: No  . Sexual activity: Never  Other Topics Concern  . Not on file  Social History Narrative   Working full time   Social Determinants of Adult nurse Strain: Not on file  Food Insecurity: Not on file  Transportation Needs: Not on file  Physical Activity: Not on file  Stress: Not on file  Social Connections: Not on file  Intimate Partner Violence: Not on file    FAMILY HISTORY: Family History  Problem Relation Age of Onset  . Heart disease Mother   . Heart attack Mother   . Arthritis Father   . Diabetes Brother     ALLERGIES:  has No Known Allergies.  MEDICATIONS:  Current Outpatient Medications  Medication Sig Dispense Refill  . amLODipine (NORVASC) 5 MG tablet Take 1 tablet (5 mg total) by mouth daily. 90 tablet 1  . diclofenac sodium (VOLTAREN) 1 % GEL Apply 2 g topically 4 (four) times daily. (Patient taking differently: Apply 2 g topically as needed.) 100 g 2  . docusate sodium (COLACE) 100 MG capsule Take 1 capsule (100 mg total) by mouth daily. 90 capsule 0  . ferrous sulfate 325 (65 FE) MG EC tablet Take 325 mg by mouth daily with breakfast. OTC    . losartan (COZAAR) 25 MG tablet Take 1 tablet (25 mg total) by mouth daily. 90 tablet 1  . polyethylene glycol (MIRALAX / GLYCOLAX) 17 g packet Take 17 g by mouth daily as  needed for mild constipation. 14 each 0  . rosuvastatin (CRESTOR) 40 MG tablet Take 1 tablet (40 mg total) by mouth daily. 90 tablet 1   No current facility-administered medications for this visit.     PHYSICAL EXAMINATION: ECOG PERFORMANCE STATUS: 1 - Symptomatic but completely ambulatory Vitals:   05/31/20 0913  BP: 124/77  Pulse: 85  Resp: 16  Temp: (!) 97.4 F (36.3 C)   Filed Weights   05/31/20 0913  Weight: 206 lb 6.4 oz (93.6 kg)    Physical Exam Constitutional:      General: She is not in acute distress.    Comments: Walk independently  HENT:     Head: Normocephalic and atraumatic.  Eyes:     General: No scleral icterus.    Pupils: Pupils are equal, round, and reactive to light.  Cardiovascular:     Rate and Rhythm: Normal rate and regular rhythm.      Heart sounds: Normal heart sounds.  Pulmonary:     Effort: Pulmonary effort is normal. No respiratory distress.     Breath sounds: No wheezing.  Abdominal:     General: Bowel sounds are normal. There is no distension.     Palpations: Abdomen is soft. There is no mass.     Comments: Ureterostomy,   Musculoskeletal:        General: No deformity. Normal range of motion.     Cervical back: Normal range of motion and neck supple.  Skin:    General: Skin is warm and dry.     Coloration: Skin is not pale.     Findings: No erythema or rash.  Neurological:     Mental Status: She is alert and oriented to person, place, and time. Mental status is at baseline.     Cranial Nerves: No cranial nerve deficit.     Coordination: Coordination normal.  Psychiatric:        Mood and Affect: Mood normal.     RADIOGRAPHIC STUDIES: I have personally reviewed the radiological images as listed and agreed with the findings in the report.  CMP Latest Ref Rng & Units 05/31/2020  Glucose 70 - 99 mg/dL 188(H)  BUN 8 - 23 mg/dL 40(H)  Creatinine 0.44 - 1.00 mg/dL 1.49(H)  Sodium 135 - 145 mmol/L 140  Potassium 3.5 - 5.1 mmol/L 3.9  Chloride 98 - 111 mmol/L 108  CO2 22 - 32 mmol/L 23  Calcium 8.9 - 10.3 mg/dL 9.2  Total Protein 6.5 - 8.1 g/dL 7.7  Total Bilirubin 0.3 - 1.2 mg/dL 0.5  Alkaline Phos 38 - 126 U/L 94  AST 15 - 41 U/L 19  ALT 0 - 44 U/L 9   CBC Latest Ref Rng & Units 05/31/2020  WBC 4.0 - 10.5 K/uL 6.4  Hemoglobin 12.0 - 15.0 g/dL 9.3(L)  Hematocrit 36.0 - 46.0 % 30.7(L)  Platelets 150 - 400 K/uL 229    LABORATORY DATA:  I have reviewed the data as listed Lab Results  Component Value Date   WBC 6.4 05/31/2020   HGB 9.3 (L) 05/31/2020   HCT 30.7 (L) 05/31/2020   MCV 78.1 (L) 05/31/2020   PLT 229 05/31/2020   Recent Labs    02/09/20 0832 02/16/20 1307 03/01/20 1301 03/22/20 0846 04/12/20 0846 05/03/20 0848 05/31/20 0841  NA 138 139 140   < > 138 139 140  K 4.4 4.6 4.6   <  > 4.0 4.0 3.9  CL 104 107 108   < > 105  106 108  CO2 21* 20* 22   < > _0 GLUCOSE 228* 163* 190*   < > 162* 193* 188*  BUN 41* 49* 33*   < > 26* 35* 40*  CREATININE 2.35* 2.41* 2.08*   < > 1.66* 1.70* 1.49*  CALCIUM 9.0 9.3 9.3   < > 8.8* 9.1 9.2  GFRNONAA 20* 20* 24*   < > 33* 32* 38*  GFRAA 24* 23* 27*  --   --   --   --   PROT 7.9 8.6* 7.8   < > 7.5 7.7 7.7  ALBUMIN 4.3 4.5 4.3   < > 4.1 3.9 4.0  AST _1 < > _2 ALT _3 < > _4 ALKPHOS 100 86 87   < > 86 99 94  BILITOT 0.6 0.5 0.5   < > 0.4 0.5 0.5   < > = values in this interval not displayed.   Iron/TIBC/Ferritin/ %Sat    Component Value Date/Time   IRON 73 11/17/2019 0936   IRON 26 (L) 02/04/2019 1309   TIBC 316 11/17/2019 0936   TIBC 251 02/04/2019 1309   FERRITIN 256 11/17/2019 0936   FERRITIN 409 (H) 02/04/2019 1309   IRONPCTSAT 23 11/17/2019 0936   IRONPCTSAT 10 (L) 02/04/2019 1309    RADIOGRAPHIC STUDIES: I have personally reviewed the radiological images as listed and agreed with the findings in the report. CT CHEST ABDOMEN PELVIS WO CONTRAST  Result Date: 04/27/2020 CLINICAL DATA:  Bladder cancer restaging. Discomfort in the vicinity of the ileal conduit ostomy. EXAM: CT CHEST, ABDOMEN AND PELVIS WITHOUT CONTRAST TECHNIQUE: Multidetector CT imaging of the chest, abdomen and pelvis was performed following the standard protocol without IV contrast. COMPARISON:  01/06/2020 FINDINGS: CT CHEST FINDINGS Cardiovascular: Right Port-A-Cath tip: Right atrium. Coronary, aortic arch, and branch vessel atherosclerotic vascular disease. Aberrant right subclavian artery passes behind the esophagus. Mediastinum/Nodes: Contrast medium in the distal esophagus suggesting dysmotility or reflux. Lungs/Pleura: Centrilobular emphysema. Stable 3 mm right upper lobe pulmonary nodule on image 57 of series 3, no change from 08/07/2018. Speckled linear calcifications in the right lower lobe on image 117 of series 3,  no change from 08/07/2018. No compelling findings of metastatic disease to the lungs. Musculoskeletal: Bilateral degenerative glenohumeral arthropathy, right greater than left. Thoracic spondylosis. CT ABDOMEN PELVIS FINDINGS Hepatobiliary: Unremarkable Pancreas: Unremarkable Spleen: Unremarkable Adrenals/Urinary Tract: Both adrenal glands appear normal. Fullness of both collecting systems without overt hydronephrosis. Mild right hydroureter. Ileal conduit noted. Cystectomy. Stomach/Bowel: Along the upper margin of the ileal conduit, there is a peristomal Richter hernia containing a small margin of adjacent small bowel. This is associated with wall thickening in an approximately 7 cm segment of bowel adjacent to the Richter hernia. There is adjacent abnormal stranding in the mesentery and omentum. Prominent stool throughout the colon favors constipation. Vascular/Lymphatic: Aortoiliac atherosclerotic vascular disease. Stent in the right external iliac vein. Reproductive: Uterus absent. Adnexa difficult to separate from the adjacent pelvic sidewall, possible mild scarring along the region of lymph node dissection, but no progressive density is identified along the pelvic sidewalls or adnexa. Other: Small amount of pelvic ascites, similar to prior. Musculoskeletal: Grade 1 degenerative anterolisthesis of L4 on L5. The L5 vertebra is partially sacralized/transitional. Degenerative arthropathy of the hips, left greater than right. IMPRESSION: 1. Along the upper margin of the ileal conduit, there is a peristomal Richter hernia containing a small margin  of adjacent small bowel. This is associated with wall thickening in an approximately 7 cm segment of bowel adjacent to the Richter hernia. There is adjacent abnormal stranding in the mesentery and omentum. 2. Small amount of pelvic ascites, similar to prior. 3. Other imaging findings of potential clinical significance: Coronary atherosclerosis. Aberrant right subclavian  artery passes behind the esophagus. Prominent stool throughout the colon favors constipation. Degenerative arthropathy of the hips, left greater than right. Grade 1 degenerative anterolisthesis of L4 on L5. 4. Emphysema and aortic atherosclerosis. Aortic Atherosclerosis (ICD10-I70.0) and Emphysema (ICD10-J43.9). Electronically Signed   By: Van Clines M.D.   On: 04/27/2020 10:57     ASSESSMENT & PLAN:  1. Metastatic urothelial carcinoma ([place])   2. Encounter for antineoplastic immunotherapy   3. Anemia in stage 4 chronic kidney disease ([place])   4. Parastomal hernia without obstruction or gangrene   Cancer Staging Bladder carcinoma [facility]) Staging form: Urinary Bladder, AJCC 8th Edition - Clinical stage from 08/01/2018: Stage IVA (cTX, cN3, cM1a) - Signed by [name], MD on 04/21/2019  #Metastatic urothelial carcinoma of bladder, sarcomatoid features pelvic sidewall mass biopsy showed metastatic carcinoma consistent with involvement by urothelial carcinoma. Labs reviewed and discussed with patient. Continue immunotherapy maintenance-Keytruda.  #Hernia -parastomal hernia.  Discussed with urology Dr. Bernardo Heater who agrees with me that her chronic abdomen discomfort may be secondary to the hernia.  Her [facility] urologist has retired recently.  We did contact their office and was recommended to send the patient to general surgery for evaluation. Patient prefers to go to [facility] for further evaluation.  She has been referred to [facility] hernia center for further evaluation.  #Chronic kidney disease, stable creatinine.  #Chronic anemia secondary to stage III CKD.  Hemoglobin remained stable but low. Patient declines IV Venofer treatments Continue oral iron supplementation if she tolerates. #Constipation, continue current bowel regimen  #Follow-up for evaluation prior to next [facility] treatment.  In 3 weeks All questions were answered. The patient knows to call the clinic with any problems  questions or concerns.  [name], MD, PhD Hematology Oncology [facility] at [facility] Pager- [phone] 05/31/20

## 2020-05-31 NOTE — Progress Notes (Signed)
Patient reports the abdominal pain has not improved and is 3/10 on pain scale today.

## 2020-05-31 NOTE — Progress Notes (Signed)
Pt tolerated infusion well with no signs of complications. VSS. Pt stable for discharge.   Vineeth Fell  

## 2020-06-07 NOTE — Telephone Encounter (Signed)
Message left at office requesting update on referral.

## 2020-06-08 NOTE — Telephone Encounter (Signed)
Received VM form Sarah, Dr. Georgia Duff nurse, form [facility] general surgery. Judson Roch stated that they have received referral and that there is currently a wait list for Dr. Sharen Counter, but they will schedule patient as soon as there is an appt available.

## 2020-06-16 ENCOUNTER — Telehealth: Payer: Self-pay | Admitting: Family Medicine

## 2020-06-16 NOTE — Telephone Encounter (Signed)
Copied from [place] [id number]. Topic: Medicare AWV >> Jun 16, 2020 10:14 AM Cher Nakai R wrote: Reason for CRM:   Left message for patient.  AWVS appointment cancelled on Jan 24,2022.  New [facility] appointment has been scheduled for Jun 24, 2020 at 3:15 pm by phone

## 2020-06-21 ENCOUNTER — Other Ambulatory Visit: Payer: Self-pay

## 2020-06-21 ENCOUNTER — Encounter: Payer: Self-pay | Admitting: Oncology

## 2020-06-21 ENCOUNTER — Inpatient Hospital Stay: Payer: Medicare Other | Attending: Oncology

## 2020-06-21 ENCOUNTER — Inpatient Hospital Stay: Payer: Medicare Other

## 2020-06-21 ENCOUNTER — Inpatient Hospital Stay (HOSPITAL_BASED_OUTPATIENT_CLINIC_OR_DEPARTMENT_OTHER): Payer: Medicare Other | Admitting: Oncology

## 2020-06-21 VITALS — BP 161/84 | HR 92 | Temp 97.6°F | Wt 211.1 lb

## 2020-06-21 DIAGNOSIS — C679 Malignant neoplasm of bladder, unspecified: Secondary | ICD-10-CM | POA: Diagnosis not present

## 2020-06-21 DIAGNOSIS — C7910 Secondary malignant neoplasm of unspecified urinary organs: Secondary | ICD-10-CM

## 2020-06-21 DIAGNOSIS — K435 Parastomal hernia without obstruction or  gangrene: Secondary | ICD-10-CM | POA: Insufficient documentation

## 2020-06-21 DIAGNOSIS — I129 Hypertensive chronic kidney disease with stage 1 through stage 4 chronic kidney disease, or unspecified chronic kidney disease: Secondary | ICD-10-CM | POA: Diagnosis not present

## 2020-06-21 DIAGNOSIS — D631 Anemia in chronic kidney disease: Secondary | ICD-10-CM | POA: Insufficient documentation

## 2020-06-21 DIAGNOSIS — Z5112 Encounter for antineoplastic immunotherapy: Secondary | ICD-10-CM

## 2020-06-21 DIAGNOSIS — N184 Chronic kidney disease, stage 4 (severe): Secondary | ICD-10-CM | POA: Insufficient documentation

## 2020-06-21 DIAGNOSIS — K5900 Constipation, unspecified: Secondary | ICD-10-CM | POA: Insufficient documentation

## 2020-06-21 LAB — COMPREHENSIVE METABOLIC PANEL
ALT: 12 U/L (ref 0–44)
AST: 21 U/L (ref 15–41)
Albumin: 4.2 g/dL (ref 3.5–5.0)
Alkaline Phosphatase: 100 U/L (ref 38–126)
Anion gap: 8 (ref 5–15)
BUN: 27 mg/dL — ABNORMAL HIGH (ref 8–23)
CO2: 25 mmol/L (ref 22–32)
Calcium: 9.2 mg/dL (ref 8.9–10.3)
Chloride: 108 mmol/L (ref 98–111)
Creatinine, Ser: 1.4 mg/dL — ABNORMAL HIGH (ref 0.44–1.00)
GFR, Estimated: 40 mL/min — ABNORMAL LOW (ref >60)
Glucose, Bld: 181 mg/dL — ABNORMAL HIGH (ref 70–99)
Potassium: 4.2 mmol/L (ref 3.5–5.1)
Sodium: 141 mmol/L (ref 135–145)
Total Bilirubin: 0.3 mg/dL (ref 0.3–1.2)
Total Protein: 7.7 g/dL (ref 6.5–8.1)

## 2020-06-21 LAB — CBC WITH DIFFERENTIAL/PLATELET
Abs Immature Granulocytes: 0.02 10*3/uL (ref 0.00–0.07)
Basophils Absolute: 0 10*3/uL (ref 0.0–0.1)
Basophils Relative: 1 %
Eosinophils Absolute: 0.2 10*3/uL (ref 0.0–0.5)
Eosinophils Relative: 4 %
HCT: 30.1 % — ABNORMAL LOW (ref 36.0–46.0)
Hemoglobin: 9.4 g/dL — ABNORMAL LOW (ref 12.0–15.0)
Immature Granulocytes: 0 %
Lymphocytes Relative: 28 %
Lymphs Abs: 1.5 10*3/uL (ref 0.7–4.0)
MCH: 23.9 pg — ABNORMAL LOW (ref 26.0–34.0)
MCHC: 31.2 g/dL (ref 30.0–36.0)
MCV: 76.6 fL — ABNORMAL LOW (ref 80.0–100.0)
Monocytes Absolute: 0.4 10*3/uL (ref 0.1–1.0)
Monocytes Relative: 7 %
Neutro Abs: 3.2 10*3/uL (ref 1.7–7.7)
Neutrophils Relative %: 60 %
Platelets: 219 10*3/uL (ref 150–400)
RBC: 3.93 MIL/uL (ref 3.87–5.11)
RDW: 13.9 % (ref 11.5–15.5)
WBC: 5.3 10*3/uL (ref 4.0–10.5)
nRBC: 0 % (ref 0.0–0.2)

## 2020-06-21 MED ORDER — HEPARIN SOD (PORK) LOCK FLUSH 100 UNIT/ML IV SOLN
INTRAVENOUS | Status: AC
  Filled 2020-06-21: qty 5

## 2020-06-21 MED ORDER — SODIUM CHLORIDE 0.9% FLUSH
10.0000 mL | INTRAVENOUS | Status: DC | PRN
  Filled 2020-06-21: qty 10

## 2020-06-21 MED ORDER — SODIUM CHLORIDE 0.9 % IV SOLN
Freq: Once | INTRAVENOUS | Status: AC
  Filled 2020-06-21: qty 250

## 2020-06-21 MED ORDER — HEPARIN SOD (PORK) LOCK FLUSH 100 UNIT/ML IV SOLN
500.0000 [IU] | Freq: Once | INTRAVENOUS | Status: AC | PRN
  Administered 2020-06-21: 500 [IU]
  Filled 2020-06-21: qty 5

## 2020-06-21 MED ORDER — SODIUM CHLORIDE 0.9 % IV SOLN
200.0000 mg | Freq: Once | INTRAVENOUS | Status: AC
  Administered 2020-06-21: 200 mg via INTRAVENOUS
  Filled 2020-06-21: qty 8

## 2020-06-21 NOTE — Progress Notes (Signed)
Patient has no concerns for today visit 

## 2020-06-21 NOTE — Progress Notes (Signed)
Pt tolerated keytruda infusion well with no problems or complaints.  Pt left chemo suite stable and ambulatory

## 2020-06-21 NOTE — Progress Notes (Signed)
Hematology/Oncology follow up note [facility] [phone] [phone]   Patient Care Team: Lane, Rachel Elizabeth, PA-C as PCP - General (Family Medicine) Nice, Keith B, OD (Optometry) Janeil Schexnayder, MD as Consulting Physician (Hematology and Oncology)  REFERRING PROVIDER: Dr.Sninsky CHIEF COMPLAINTS/REASON FOR VISIT:  Follow up for bladder cancer, anemia.   HISTORY OF PRESENTING ILLNESS:  Angela Russell is a  69 y.o.  female with PMH listed below who was referred to me for evaluation of newly diagnosed bladder cancer. Patient initially presented to emergency room at the end of January 2020 for evaluation of dysuria, hematuria and the left lower quadrant inguinal pain and flank pain.  1/28 2020 CT renal stone study showed suspected irregular wall thickening about the superior bladder, not well assessed due to degree of bladder distention.  Recommend cystoscopy for further evaluation.  No renal stone or obstructive uropathy.  Patient was given IV Rocephin and referred patient for outpatient urology follow-up.  Urine culture was negative.  She again presented to ER after 2 days with similar symptoms.  Patient has 25-pack-year smoking history, quit approximately 20 years ago.  No family history of any urology malignancies 07/03/2018 another CT abdomen pelvis with contrast was done which showed no nephrolithiasis or hydronephrosis is identified.  Bladder is decompressed limiting evaluation.  07/16/2018.urology Dr. Sinskey - cystoscopy and bilateral retrograde pyelogram on 07/16/2018.  Pyelogram did not show any filling defect or abnormalities.  No hydronephrosis.  Ureteral orifice was not involved with tumor.  There is a large 5 cm posterior wall bladder tumor, bullous and sessile appearing.  Patient underwent TURBT.   Pathology: High-grade urothelial carcinoma, invasive into muscularis propria.  Lymphovascular invasion is present.  Carcinoma in situ is also  identified.  Focal squamous differentiation is noted, areas of invasive carcinoma display pleomorphic/sarcomatoid changes.  T2b  08/07/2018 CT without contrast negative.  2 subpleural right upper lobe nodule 2 to 3 mm likely benign. She also had baseline audiometry done.  08/04/2018 ddMVAC x 1 cycle, stopped due to intolerance and AKI.  Patient received 1 cycle of dd MVAC, not able to tolerate due to AKI. Patient then was referred to [facility]  09/22/2018 patient underwent a cystectomy, pathology pT3a N0 Mx.  11 lymph nodes were harvested and was all negative. Invasive urothelia carcinoma, high grade, with sarcomatoid features.   10/14/2018 patient was admitted due to pyelonephritis and a pelvic fluid collection.  Drain was placed. 10/23/2018 drain was removed. Patient has had difficulties getting to her appointments to [facility]. 02/09/2019, patient presented with abdominal pain. CT concerning for small bowel obstruction and increased size of right pelvic fluid collection concerning for cancer recurrence.  Right hydronephrosis to the level of pelvis and enlarged lymph nodes. Patient had JP drain placed with CT guidance to pelvic fluid collection and drained 400 cc amber fluid.  Culture was negative for growth of microorganisms and cytology was negative for malignancy.-JP drain was removed on the day of discharge. CT-guided core biopsy of pelvic lymph node adenopathy was attempted but not successful.  # Right lower extremity DVT, provoked by Transvenous biopsy  02/26/2019 transvenous biopsy by IR unsuccessful transvenous biopsy of the right pelvis mass. Post biopsy acute thrombus in the right external iliac and common femoral vein.  Patient was recommended to start anticoagulation with Xarelto however due to the co-pay, patient is not able to afford the medication. Anticoagulation regimen was switched to Eliquis 5 mg.  Patient reports that she has been taking   it once a day. 03/20/2019 PET showed  FDG avid tissue in the cystectomy bed, retroperitoneal and pelvic lymphadenopathy.  Right common iliac DVT.  # increased right lower extremity swelling.  She was started on Eliquis for anticoagulation by [facility].  We clarified with her pharmacy and she was actually taking Eliquis 2.5 mg twice daily. Right lower extremity swelling has not improved but instead worsened. 03/16/2019 She had ultrasound right lower extremity done which showed persistent extensive proximal right lower extremity DVT.  Anticoagulation regimen has increased to Eliquis 5 mg twice daily.  # establish care with [facility] oncology Dr. [name] for evaluation.  Dr. [name] recommended starting immunotherapy with PD-L1 inhibitor Pembrolizumab 200 mg every 3 weeks.  The sarcomatoid histology may not respond to chemotherapy well, could portend a better chance of response into immunotherapy PET scan after 4 cycles of Keytruda showed single mildly enlarged central mesenteric lymph node in the upper pelvis with SUV 9.6.  No other abnormal hypermetabolic activity was reported.  # #Right lower extremity  femoral vein DVT provoked by transvenous biopsy in the context of cancer recurrence, Status post IV C filter and mechanical thrombectomy.  IVC filter has been removed. She is currently off anticoagulation after she developed hematuria.  # 03/30/2019 Status post IVC filter placement, mechanical thrombectomy.-IVC filter was retrieved in January 2021. # PD-L1 CPS 100%.  # 03/31/2019 started on immunotherapy Keytruda.  # 04/26/2020 CT chest abdomen pelvis was reviewed and discussed with patient. No definitive finding of disease recurrence or metastasis.  Small amount of ascites similar to prior. There is a parastomal hernia containing a small margin of adjacent small bowel.  This is associated with wall thickening in approximately 7 cm segment of bowel adjacent to the hernia.  There is adjacent abnormal stranding of the mesentery and  omentum.   INTERVAL HISTORY Angela Russell is a 70 y.o. female who has above history reviewed by me today presents for follow up visit for evaluation form metastatic high-grade urothelial carcinoma of the bladder. Sarcomatoid feature.  Patient reports feeling well at baseline.  She continues to gain weight.  Appetite is good.  Denies any pain.  She continues to have the chronic abdominal discomfort.   Review of Systems  Constitutional: Negative for appetite change, chills, fatigue and fever.  HENT:   Negative for hearing loss and voice change.   Eyes: Negative for eye problems.  Respiratory: Negative for chest tightness and cough.   Cardiovascular: Negative for chest pain and leg swelling.  Gastrointestinal: Negative for abdominal distention, abdominal pain and blood in stool.       Abdominal discomfort.   Endocrine: Negative for hot flashes.  Genitourinary: Negative for difficulty urinating and frequency.   Musculoskeletal: Negative for arthralgias.  Skin: Negative for itching and rash.  Neurological: Positive for numbness. Negative for extremity weakness.  Hematological: Negative for adenopathy.  Psychiatric/Behavioral: Negative for confusion. The patient is not nervous/anxious.     MEDICAL HISTORY:  Past Medical History:  Diagnosis Date  . Carotid artery plaque, right 01/2014  . CKD (chronic kidney disease)    stage 2-3  . Diabetes mellitus without complication ([place])   . DM (diabetes mellitus), type 2, uncontrolled ([place])   . Hyperlipidemia   . Hypertension   . Hypochromic microcytic anemia    mild  . Metastatic urothelial carcinoma ([place]) 03/23/2019  . Osteoporosis   . Renal insufficiency   . Rotator cuff tendonitis, right     SURGICAL HISTORY: Past Surgical History:  Procedure  Laterality Date  . ABDOMINAL HYSTERECTOMY  2000   due to bleeding and fibroids, partial- still has ovaries  . CYSTOSCOPY W/ RETROGRADES Bilateral 07/16/2018   Procedure: CYSTOSCOPY WITH  RETROGRADE PYELOGRAM;  Surgeon: Billey Co, MD;  Location: [facility] ORS;  Service: Urology;  Laterality: Bilateral;  . IVC FILTER INSERTION N/A 03/30/2019   Procedure: IVC FILTER INSERTION;  Surgeon: Algernon Huxley, MD;  Location: [place] [facility];  Service: Cardiovascular;  Laterality: N/A;  . IVC FILTER REMOVAL N/A 06/15/2019   Procedure: IVC FILTER REMOVAL;  Surgeon: Algernon Huxley, MD;  Location: [place] [facility];  Service: Cardiovascular;  Laterality: N/A;  . KNEE SURGERY Left 03/17/2013   torn meniscus  . PERIPHERAL VASCULAR THROMBECTOMY Right 03/30/2019   Procedure: PERIPHERAL VASCULAR THROMBECTOMY;  Surgeon: Algernon Huxley, MD;  Location: [place] [facility];  Service: Cardiovascular;  Laterality: Right;  . PORTA CATH INSERTION N/A 08/06/2018   Procedure: PORTA CATH INSERTION;  Surgeon: Algernon Huxley, MD;  Location: [place] [facility];  Service: Cardiovascular;  Laterality: N/A;  . TRANSURETHRAL RESECTION OF BLADDER TUMOR N/A 07/16/2018   Procedure: TRANSURETHRAL RESECTION OF BLADDER TUMOR (TURBT);  Surgeon: Billey Co, MD;  Location: [facility] OR[facility];  Service: Urology;  Laterality: N/A;    SOCIAL HISTORY: Social History   Socioeconomic History  . Marital status: Single    Spouse name: Not on file  . Number of children: 1  . Years of education: Not on file  . Highest education level: 10th grade  Occupational History  . Not on file  Tobacco Use  . Smoking status: Former Smoker    Quit date: 06/05/1991    Years since quitting: 29.0  . Smokeless tobacco: Never Used  Vaping Use  . Vaping Use: Never used  Substance and Sexual Activity  . Alcohol use: No  . Drug use: No  . Sexual activity: Never  Other Topics Concern  . Not on file  Social History Narrative   Working full time   Social Determinants of Radio broadcast assistant Strain: Not on file  Food Insecurity: Not on file  Transportation Needs: Not on file  Physical Activity: Not on file  Stress: Not on file   Social Connections: Not on file  Intimate Partner Violence: Not on file    FAMILY HISTORY: Family History  Problem Relation Age of Onset  . Heart disease Mother   . Heart attack Mother   . Arthritis Father   . Diabetes Brother     ALLERGIES:  has No Known Allergies.  MEDICATIONS:  Current Outpatient Medications  Medication Sig Dispense Refill  . amLODipine (NORVASC) 5 MG tablet Take 1 tablet (5 mg total) by mouth daily. 90 tablet 1  . diclofenac sodium (VOLTAREN) 1 % GEL Apply 2 g topically 4 (four) times daily. (Patient taking differently: Apply 2 g topically as needed.) 100 g 2  . docusate sodium (COLACE) 100 MG capsule Take 1 capsule (100 mg total) by mouth daily. 90 capsule 0  . ferrous sulfate 325 (65 FE) MG EC tablet Take 325 mg by mouth daily with breakfast. OTC    . losartan (COZAAR) 25 MG tablet Take 1 tablet (25 mg total) by mouth daily. 90 tablet 1  . polyethylene glycol (MIRALAX / GLYCOLAX) 17 g packet Take 17 g by mouth daily as needed for mild constipation. 14 each 0  . rosuvastatin (CRESTOR) 40 MG tablet Take 1 tablet (40 mg total) by mouth daily. [place]  tablet 1   No current facility-administered medications for this visit.   Facility-Administered Medications Ordered in Other Visits  Medication Dose Route Frequency Provider Last Rate Last Admin  . sodium chloride flush (NS) 0.9 % injection 10 mL  10 mL Intracatheter PRN [name], MD         PHYSICAL EXAMINATION: ECOG PERFORMANCE STATUS: 1 - Symptomatic but completely ambulatory Vitals:   06/21/20 1029  BP: (!) 161/84  Pulse: 92  Temp: 97.6 F (36.4 C)   Filed Weights   06/21/20 1029  Weight: 211 lb 1.6 oz (95.8 kg)    Physical Exam Constitutional:      General: She is not in acute distress.    Comments: Walk independently  HENT:     Head: Normocephalic and atraumatic.  Eyes:     General: No scleral icterus.    Pupils: Pupils are equal, round, and reactive to light.  Cardiovascular:     Rate and  Rhythm: Normal rate and regular rhythm.     Heart sounds: Normal heart sounds.  Pulmonary:     Effort: Pulmonary effort is normal. No respiratory distress.     Breath sounds: No wheezing.  Abdominal:     General: Bowel sounds are normal. There is no distension.     Palpations: Abdomen is soft. There is no mass.     Comments: Ureterostomy,   Musculoskeletal:        General: No deformity. Normal range of motion.     Cervical back: Normal range of motion and neck supple.  Skin:    General: Skin is warm and dry.     Coloration: Skin is not pale.     Findings: No erythema or rash.  Neurological:     Mental Status: She is alert and oriented to person, place, and time. Mental status is at baseline.     Cranial Nerves: No cranial nerve deficit.     Coordination: Coordination normal.  Psychiatric:        Mood and Affect: Mood normal.   .  RADIOGRAPHIC STUDIES: I have personally reviewed the radiological images as listed and agreed with the findings in the report.  CMP Latest Ref Rng & Units 06/21/2020  Glucose 70 - 99 mg/dL 181(H)  BUN 8 - 23 mg/dL 27(H)  Creatinine 0.44 - 1.00 mg/dL 1.40(H)  Sodium 135 - 145 mmol/L 141  Potassium 3.5 - 5.1 mmol/L 4.2  Chloride 98 - 111 mmol/L 108  CO2 22 - 32 mmol/L 25  Calcium 8.9 - 10.3 mg/dL 9.2  Total Protein 6.5 - 8.1 g/dL 7.7  Total Bilirubin 0.3 - 1.2 mg/dL 0.3  Alkaline Phos 38 - 126 U/L 100  AST 15 - 41 U/L 21  ALT 0 - 44 U/L 12   CBC Latest Ref Rng & Units 06/21/2020  WBC 4.0 - 10.5 K/uL 5.3  Hemoglobin 12.0 - 15.0 g/dL 9.4(L)  Hematocrit 36.0 - 46.0 % 30.1(L)  Platelets 150 - 400 K/uL 219    LABORATORY DATA:  I have reviewed the data as listed Lab Results  Component Value Date   WBC 5.3 06/21/2020   HGB 9.4 (L) 06/21/2020   HCT 30.1 (L) 06/21/2020   MCV 76.6 (L) 06/21/2020   PLT 219 06/21/2020   Recent Labs    02/09/20 0832 02/16/20 1307 03/01/20 1301 03/22/20 0846 05/03/20 0848 05/31/20 0841 06/21/20 1011  NA 138  139 140   < > 139 140 141  K 4.4 4.6 4.6   < >  4.0 3.9 4.2  CL 104 107 108   < > 106 108 108  CO2 21* 20* 22   < > $R'23 23 25  'Pf$ GLUCOSE 228* 163* 190*   < > 193* 188* 181*  BUN 41* 49* 33*   < > 35* 40* 27*  CREATININE 2.35* 2.41* 2.08*   < > 1.70* 1.49* 1.40*  CALCIUM 9.0 9.3 9.3   < > 9.1 9.2 9.2  GFRNONAA 20* 20* 24*   < > 32* 38* 40*  GFRAA 24* 23* 27*  --   --   --   --   PROT 7.9 8.6* 7.8   < > 7.7 7.7 7.7  ALBUMIN 4.3 4.5 4.3   < > 3.9 4.0 4.2  AST $Re'27 22 23   'Hqg$ < > $R'18 19 21  'ff$ ALT $'14 14 13   'W$ < > $R'11 9 12  'qh$ ALKPHOS 100 86 87   < > 99 94 100  BILITOT 0.6 0.5 0.5   < > 0.5 0.5 0.3   < > = values in this interval not displayed.   Iron/TIBC/Ferritin/ %Sat    Component Value Date/Time   IRON 73 11/17/2019 0936   IRON 26 (L) 02/04/2019 1309   TIBC 316 11/17/2019 0936   TIBC 251 02/04/2019 1309   FERRITIN 256 11/17/2019 0936   FERRITIN 409 (H) 02/04/2019 1309   IRONPCTSAT 23 11/17/2019 0936   IRONPCTSAT 10 (L) 02/04/2019 1309    RADIOGRAPHIC STUDIES: I have personally reviewed the radiological images as listed and agreed with the findings in the report. CT CHEST ABDOMEN PELVIS WO CONTRAST  Result Date: 04/27/2020 CLINICAL DATA:  Bladder cancer restaging. Discomfort in the vicinity of the ileal conduit ostomy. EXAM: CT CHEST, ABDOMEN AND PELVIS WITHOUT CONTRAST TECHNIQUE: Multidetector CT imaging of the chest, abdomen and pelvis was performed following the standard protocol without IV contrast. COMPARISON:  01/06/2020 FINDINGS: CT CHEST FINDINGS Cardiovascular: Right Port-A-Cath tip: Right atrium. Coronary, aortic arch, and branch vessel atherosclerotic vascular disease. Aberrant right subclavian artery passes behind the esophagus. Mediastinum/Nodes: Contrast medium in the distal esophagus suggesting dysmotility or reflux. Lungs/Pleura: Centrilobular emphysema. Stable 3 mm right upper lobe pulmonary nodule on image 57 of series 3, no change from 08/07/2018. Speckled linear calcifications in  the right lower lobe on image 117 of series 3, no change from 08/07/2018. No compelling findings of metastatic disease to the lungs. Musculoskeletal: Bilateral degenerative glenohumeral arthropathy, right greater than left. Thoracic spondylosis. CT ABDOMEN PELVIS FINDINGS Hepatobiliary: Unremarkable Pancreas: Unremarkable Spleen: Unremarkable Adrenals/Urinary Tract: Both adrenal glands appear normal. Fullness of both collecting systems without overt hydronephrosis. Mild right hydroureter. Ileal conduit noted. Cystectomy. Stomach/Bowel: Along the upper margin of the ileal conduit, there is a peristomal Richter hernia containing a small margin of adjacent small bowel. This is associated with wall thickening in an approximately 7 cm segment of bowel adjacent to the Richter hernia. There is adjacent abnormal stranding in the mesentery and omentum. Prominent stool throughout the colon favors constipation. Vascular/Lymphatic: Aortoiliac atherosclerotic vascular disease. Stent in the right external iliac vein. Reproductive: Uterus absent. Adnexa difficult to separate from the adjacent pelvic sidewall, possible mild scarring along the region of lymph node dissection, but no progressive density is identified along the pelvic sidewalls or adnexa. Other: Small amount of pelvic ascites, similar to prior. Musculoskeletal: Grade 1 degenerative anterolisthesis of L4 on L5. The L5 vertebra is partially sacralized/transitional. Degenerative arthropathy of the hips, left greater than right. IMPRESSION: 1. Along the upper margin of  the ileal conduit, there is a peristomal Richter hernia containing a small margin of adjacent small bowel. This is associated with wall thickening in an approximately 7 cm segment of bowel adjacent to the Richter hernia. There is adjacent abnormal stranding in the mesentery and omentum. 2. Small amount of pelvic ascites, similar to prior. 3. Other imaging findings of potential clinical significance:  Coronary atherosclerosis. Aberrant right subclavian artery passes behind the esophagus. Prominent stool throughout the colon favors constipation. Degenerative arthropathy of the hips, left greater than right. Grade 1 degenerative anterolisthesis of L4 on L5. 4. Emphysema and aortic atherosclerosis. Aortic Atherosclerosis (ICD10-I70.0) and Emphysema (ICD10-J43.9). Electronically Signed   By: Van Clines M.D.   On: 04/27/2020 10:57     ASSESSMENT & PLAN:  1. Metastatic urothelial carcinoma ([place])   2. Encounter for antineoplastic immunotherapy   3. Anemia in stage 4 chronic kidney disease ([place])   4. Parastomal hernia without obstruction or gangrene   Cancer Staging Bladder carcinoma [facility]) Staging form: Urinary Bladder, AJCC 8th Edition - Clinical stage from 08/01/2018: Stage IVA (cTX, cN3, cM1a) - Signed by [name], MD on 04/21/2019  #Metastatic urothelial carcinoma of bladder, sarcomatoid features pelvic sidewall mass biopsy showed metastatic carcinoma consistent with involvement by urothelial carcinoma. Labs reviewed and discussed with patient. Proceed with Keytruda treatment today.  #Hernia -parastomal hernia.  Discussed with urology Dr. Bernardo Heater who agrees with me that her chronic abdomen discomfort may be secondary to the hernia.  Her [facility] urologist has retired recently.  We did contact their office and was recommended to send the patient to general surgery for evaluation. She has been referred to [facility] hernia center for further evaluation.  #Chronic kidney disease, stable creatinine.  #Chronic anemia secondary to stage III CKD.  Hemoglobin remained stable but low. Patient declines IV Venofer treatments  #Constipation, continue current bowel regimen  #Follow-up for evaluation prior to next [facility] treatment.  In 3 weeks All questions were answered. The patient knows to call the clinic with any problems questions or concerns.  [name], MD, PhD Hematology Oncology [facility] Pager- [phone] 06/21/20

## 2020-06-24 ENCOUNTER — Ambulatory Visit: Payer: Medicare Other

## 2020-06-27 ENCOUNTER — Ambulatory Visit: Payer: Medicare Other

## 2020-07-04 ENCOUNTER — Telehealth: Payer: Self-pay | Admitting: Nurse Practitioner

## 2020-07-04 NOTE — Telephone Encounter (Signed)
Copied from [place] [id number]. Topic: Medicare AWV >> Jul 04, 2020  1:48 PM Cher Nakai R wrote: Reason for CRM:  Left message for patient to call back and schedule the Medicare Annual Wellness Visit (AWV) virtually or by telephone.  Last AWV 06/22/2019  Please schedule at anytime with [facility] Health Advisor.  45 minute appointment  Any questions, please call me at [phone]

## 2020-07-12 ENCOUNTER — Other Ambulatory Visit: Payer: Self-pay

## 2020-07-12 ENCOUNTER — Encounter: Payer: Self-pay | Admitting: Oncology

## 2020-07-12 ENCOUNTER — Telehealth: Payer: Self-pay

## 2020-07-12 ENCOUNTER — Inpatient Hospital Stay (HOSPITAL_BASED_OUTPATIENT_CLINIC_OR_DEPARTMENT_OTHER): Payer: Medicare Other | Admitting: Oncology

## 2020-07-12 ENCOUNTER — Inpatient Hospital Stay: Payer: Medicare Other | Attending: Oncology

## 2020-07-12 ENCOUNTER — Inpatient Hospital Stay: Payer: Medicare Other

## 2020-07-12 VITALS — BP 145/75 | HR 86 | Temp 97.1°F | Resp 16 | Wt 191.0 lb

## 2020-07-12 DIAGNOSIS — E11649 Type 2 diabetes mellitus with hypoglycemia without coma: Secondary | ICD-10-CM | POA: Diagnosis not present

## 2020-07-12 DIAGNOSIS — C7910 Secondary malignant neoplasm of unspecified urinary organs: Secondary | ICD-10-CM

## 2020-07-12 DIAGNOSIS — Z5112 Encounter for antineoplastic immunotherapy: Secondary | ICD-10-CM

## 2020-07-12 DIAGNOSIS — C679 Malignant neoplasm of bladder, unspecified: Secondary | ICD-10-CM | POA: Diagnosis not present

## 2020-07-12 DIAGNOSIS — R634 Abnormal weight loss: Secondary | ICD-10-CM | POA: Insufficient documentation

## 2020-07-12 DIAGNOSIS — K435 Parastomal hernia without obstruction or  gangrene: Secondary | ICD-10-CM | POA: Diagnosis not present

## 2020-07-12 DIAGNOSIS — K5900 Constipation, unspecified: Secondary | ICD-10-CM | POA: Diagnosis not present

## 2020-07-12 DIAGNOSIS — N1832 Chronic kidney disease, stage 3b: Secondary | ICD-10-CM | POA: Diagnosis not present

## 2020-07-12 DIAGNOSIS — R635 Abnormal weight gain: Secondary | ICD-10-CM | POA: Diagnosis not present

## 2020-07-12 DIAGNOSIS — E1122 Type 2 diabetes mellitus with diabetic chronic kidney disease: Secondary | ICD-10-CM | POA: Insufficient documentation

## 2020-07-12 DIAGNOSIS — D631 Anemia in chronic kidney disease: Secondary | ICD-10-CM | POA: Insufficient documentation

## 2020-07-12 DIAGNOSIS — I129 Hypertensive chronic kidney disease with stage 1 through stage 4 chronic kidney disease, or unspecified chronic kidney disease: Secondary | ICD-10-CM | POA: Insufficient documentation

## 2020-07-12 LAB — CBC WITH DIFFERENTIAL/PLATELET
Abs Immature Granulocytes: 0.02 10*3/uL (ref 0.00–0.07)
Basophils Absolute: 0 10*3/uL (ref 0.0–0.1)
Basophils Relative: 1 %
Eosinophils Absolute: 0.2 10*3/uL (ref 0.0–0.5)
Eosinophils Relative: 3 %
HCT: 29.3 % — ABNORMAL LOW (ref 36.0–46.0)
Hemoglobin: 9.2 g/dL — ABNORMAL LOW (ref 12.0–15.0)
Immature Granulocytes: 0 %
Lymphocytes Relative: 26 %
Lymphs Abs: 1.4 10*3/uL (ref 0.7–4.0)
MCH: 24.1 pg — ABNORMAL LOW (ref 26.0–34.0)
MCHC: 31.4 g/dL (ref 30.0–36.0)
MCV: 76.7 fL — ABNORMAL LOW (ref 80.0–100.0)
Monocytes Absolute: 0.3 10*3/uL (ref 0.1–1.0)
Monocytes Relative: 6 %
Neutro Abs: 3.6 10*3/uL (ref 1.7–7.7)
Neutrophils Relative %: 64 %
Platelets: 207 10*3/uL (ref 150–400)
RBC: 3.82 MIL/uL — ABNORMAL LOW (ref 3.87–5.11)
RDW: 14 % (ref 11.5–15.5)
WBC: 5.5 10*3/uL (ref 4.0–10.5)
nRBC: 0 % (ref 0.0–0.2)

## 2020-07-12 LAB — COMPREHENSIVE METABOLIC PANEL
ALT: 11 U/L (ref 0–44)
AST: 20 U/L (ref 15–41)
Albumin: 3.9 g/dL (ref 3.5–5.0)
Alkaline Phosphatase: 101 U/L (ref 38–126)
Anion gap: 11 (ref 5–15)
BUN: 29 mg/dL — ABNORMAL HIGH (ref 8–23)
CO2: 23 mmol/L (ref 22–32)
Calcium: 8.9 mg/dL (ref 8.9–10.3)
Chloride: 106 mmol/L (ref 98–111)
Creatinine, Ser: 1.56 mg/dL — ABNORMAL HIGH (ref 0.44–1.00)
GFR, Estimated: 36 mL/min — ABNORMAL LOW (ref >60)
Glucose, Bld: 170 mg/dL — ABNORMAL HIGH (ref 70–99)
Potassium: 4.2 mmol/L (ref 3.5–5.1)
Sodium: 140 mmol/L (ref 135–145)
Total Bilirubin: 0.4 mg/dL (ref 0.3–1.2)
Total Protein: 7.5 g/dL (ref 6.5–8.1)

## 2020-07-12 LAB — TSH: TSH: 3.135 u[IU]/mL (ref 0.350–4.500)

## 2020-07-12 MED ORDER — LIDOCAINE-PRILOCAINE 2.5-2.5 % EX CREA: 1.0000 "application " | TOPICAL_CREAM | CUTANEOUS | 3 refills | Status: DC | PRN

## 2020-07-12 MED ORDER — SODIUM CHLORIDE 0.9% FLUSH
10.0000 mL | Freq: Once | INTRAVENOUS | Status: AC
  Administered 2020-07-12: 10 mL via INTRAVENOUS
  Filled 2020-07-12: qty 10

## 2020-07-12 MED ORDER — SODIUM CHLORIDE 0.9 % IV SOLN
Freq: Once | INTRAVENOUS | Status: AC
  Filled 2020-07-12: qty 250

## 2020-07-12 MED ORDER — HEPARIN SOD (PORK) LOCK FLUSH 100 UNIT/ML IV SOLN
500.0000 [IU] | Freq: Once | INTRAVENOUS | Status: AC
  Administered 2020-07-12: 500 [IU] via INTRAVENOUS
  Filled 2020-07-12: qty 5

## 2020-07-12 MED ORDER — HEPARIN SOD (PORK) LOCK FLUSH 100 UNIT/ML IV SOLN
INTRAVENOUS | Status: AC
  Filled 2020-07-12: qty 5

## 2020-07-12 MED ORDER — SODIUM CHLORIDE 0.9 % IV SOLN
200.0000 mg | Freq: Once | INTRAVENOUS | Status: AC
  Administered 2020-07-12: 200 mg via INTRAVENOUS
  Filled 2020-07-12: qty 8

## 2020-07-12 NOTE — Progress Notes (Signed)
Hematology/Oncology follow up note [facility] [phone] [phone]   Patient Care Team: [name], NP as PCP - General [place], [name], OD (Optometry) [name], MD as Consulting Physician (Hematology and Oncology)  REFERRING PROVIDER: Dr.Sninsky CHIEF COMPLAINTS/REASON FOR VISIT:  Follow up for bladder cancer, anemia.   HISTORY OF PRESENTING ILLNESS:  Angela Russell is a  71 y.o.  female with PMH listed below who was referred to me for evaluation of newly diagnosed bladder cancer. Patient initially presented to emergency room at the end of January 2020 for evaluation of dysuria, hematuria and the left lower quadrant inguinal pain and flank pain.  1/28 2020 CT renal stone study showed suspected irregular wall thickening about the superior bladder, not well assessed due to degree of bladder distention.  Recommend cystoscopy for further evaluation.  No renal stone or obstructive uropathy.  Patient was given IV Rocephin and referred patient for outpatient urology follow-up.  Urine culture was negative.  She again presented to ER after 2 days with similar symptoms.  Patient has 25-pack-year smoking history, quit approximately 20 years ago.  No family history of any urology malignancies 07/03/2018 another CT abdomen pelvis with contrast was done which showed no nephrolithiasis or hydronephrosis is identified.  Bladder is decompressed limiting evaluation.  07/16/2018.urology Dr. [name] - cystoscopy and bilateral retrograde pyelogram on 07/16/2018.  Pyelogram did not show any filling defect or abnormalities.  No hydronephrosis.  Ureteral orifice was not involved with tumor.  There is a large 5 cm posterior wall bladder tumor, bullous and sessile appearing.  Patient underwent TURBT.   Pathology: High-grade urothelial carcinoma, invasive into muscularis propria.  Lymphovascular invasion is present.  Carcinoma in situ is also identified.  Focal squamous  differentiation is noted, areas of invasive carcinoma display pleomorphic/sarcomatoid changes.  T2b  08/07/2018 CT without contrast negative.  2 subpleural right upper lobe nodule 2 to 3 mm likely benign. She also had baseline audiometry done.  08/04/2018 ddMVAC x 1 cycle, stopped due to intolerance and AKI.  Patient received 1 cycle of dd MVAC, not able to tolerate due to AKI. Patient then was referred to [facility] urology  09/22/2018 patient underwent a cystectomy, pathology pT3a N0 Mx.  11 lymph nodes were harvested and was all negative. Invasive urothelia carcinoma, high grade, with sarcomatoid features.   10/14/2018 patient was admitted due to pyelonephritis and a pelvic fluid collection.  Drain was placed. 10/23/2018 drain was removed. Patient has had difficulties getting to her appointments to [facility]. 02/09/2019, patient presented with abdominal pain. CT concerning for small bowel obstruction and increased size of right pelvic fluid collection concerning for cancer recurrence.  Right hydronephrosis to the level of pelvis and enlarged lymph nodes. Patient had JP drain placed with CT guidance to pelvic fluid collection and drained 400 cc amber fluid.  Culture was negative for growth of microorganisms and cytology was negative for malignancy.-JP drain was removed on the day of discharge. CT-guided core biopsy of pelvic lymph node adenopathy was attempted but not successful.  # Right lower extremity DVT, provoked by Transvenous biopsy  02/26/2019 transvenous biopsy by IR unsuccessful transvenous biopsy of the right pelvis mass. Post biopsy acute thrombus in the right external iliac and common femoral vein.  Patient was recommended to start anticoagulation with Xarelto however due to the co-pay, patient is not able to afford the medication. Anticoagulation regimen was switched to Eliquis 5 mg.  Patient reports that she has been taking it once a  day. 03/20/2019 PET showed FDG avid tissue in the  cystectomy bed, retroperitoneal and pelvic lymphadenopathy.  Right common iliac DVT.  # increased right lower extremity swelling.  She was started on Eliquis for anticoagulation by [facility].  We clarified with her pharmacy and she was actually taking Eliquis 2.5 mg twice daily. Right lower extremity swelling has not improved but instead worsened. 03/16/2019 She had ultrasound right lower extremity done which showed persistent extensive proximal right lower extremity DVT.  Anticoagulation regimen has increased to Eliquis 5 mg twice daily.  # establish care with [facility] oncology Dr. [name] for evaluation.  Dr. [name] recommended starting immunotherapy with PD-L1 inhibitor Pembrolizumab 200 mg every 3 weeks.  The sarcomatoid histology may not respond to chemotherapy well, could portend a better chance of response into immunotherapy PET scan after 4 cycles of Keytruda showed single mildly enlarged central mesenteric lymph node in the upper pelvis with SUV 9.6.  No other abnormal hypermetabolic activity was reported.  # #Right lower extremity  femoral vein DVT provoked by transvenous biopsy in the context of cancer recurrence, Status post IV C filter and mechanical thrombectomy.  IVC filter has been removed. She is currently off anticoagulation after she developed hematuria.  # 03/30/2019 Status post IVC filter placement, mechanical thrombectomy.-IVC filter was retrieved in January 2021. # PD-L1 CPS 100%.  # 03/31/2019 started on immunotherapy Keytruda.  # 04/26/2020 CT chest abdomen pelvis was reviewed and discussed with patient. No definitive finding of disease recurrence or metastasis.  Small amount of ascites similar to prior. There is a parastomal hernia containing a small margin of adjacent small bowel.  This is associated with wall thickening in approximately 7 cm segment of bowel adjacent to the hernia.  There is adjacent abnormal stranding of the mesentery and omentum.   INTERVAL HISTORY Angela Russell is a 70 y.o. female who has above history reviewed by me today presents for follow up visit for evaluation form metastatic high-grade urothelial carcinoma of the bladder. Sarcomatoid feature.  Patient reports feeling well at baseline.  She has not made appointment with [facility] hernia center in March 2020 to Reports that continues to have weight gain.  She exercises once or twice a week. Also reported area under her right breast, burning sensations, intermittent.  No skin rash.  Report remote history of shingles on her left breast.   Review of Systems  Constitutional: Negative for appetite change, chills, fatigue and fever.  HENT:   Negative for hearing loss and voice change.   Eyes: Negative for eye problems.  Respiratory: Negative for chest tightness and cough.   Cardiovascular: Negative for chest pain and leg swelling.  Gastrointestinal: Negative for abdominal distention, abdominal pain and blood in stool.       Abdominal discomfort.   Endocrine: Negative for hot flashes.  Genitourinary: Negative for difficulty urinating and frequency.   Musculoskeletal: Negative for arthralgias.  Skin: Negative for itching and rash.       No rash in the right breast.  A few areas of skin hyperpigmentation.  Skin is intact  Neurological: Positive for numbness. Negative for extremity weakness.  Hematological: Negative for adenopathy.  Psychiatric/Behavioral: Negative for confusion. The patient is not nervous/anxious.     MEDICAL HISTORY:  Past Medical History:  Diagnosis Date  . Carotid artery plaque, right 01/2014  . CKD (chronic kidney disease)    stage 2-3  . Diabetes mellitus without complication ([place])   . DM (diabetes mellitus), type 2, uncontrolled ([place])   .  Hyperlipidemia   . Hypertension   . Hypochromic microcytic anemia    mild  . Metastatic urothelial carcinoma (HCC) 03/23/2019  . Osteoporosis   . Renal insufficiency   . Rotator cuff tendonitis, right     SURGICAL  HISTORY: Past Surgical History:  Procedure Laterality Date  . ABDOMINAL HYSTERECTOMY  2000   due to bleeding and fibroids, partial- still has ovaries  . CYSTOSCOPY W/ RETROGRADES Bilateral 07/16/2018   Procedure: CYSTOSCOPY WITH RETROGRADE PYELOGRAM;  Surgeon: Sondra Come, MD;  Location: [facility] ORS;  Service: Urology;  Laterality: Bilateral;  . IVC FILTER INSERTION N/A 03/30/2019   Procedure: IVC FILTER INSERTION;  Surgeon: Annice Needy, MD;  Location: [facility] INVASIVE CV LAB;  Service: Cardiovascular;  Laterality: N/A;  . IVC FILTER REMOVAL N/A 06/15/2019   Procedure: IVC FILTER REMOVAL;  Surgeon: Annice Needy, MD;  Location: [facility] INVASIVE CV LAB;  Service: Cardiovascular;  Laterality: N/A;  . KNEE SURGERY Left 03/17/2013   torn meniscus  . PERIPHERAL VASCULAR THROMBECTOMY Right 03/30/2019   Procedure: PERIPHERAL VASCULAR THROMBECTOMY;  Surgeon: Annice Needy, MD;  Location: [facility] INVASIVE CV LAB;  Service: Cardiovascular;  Laterality: Right;  . PORTA CATH INSERTION N/A 08/06/2018   Procedure: PORTA CATH INSERTION;  Surgeon: Annice Needy, MD;  Location: [facility] INVASIVE CV LAB;  Service: Cardiovascular;  Laterality: N/A;  . TRANSURETHRAL RESECTION OF BLADDER TUMOR N/A 07/16/2018   Procedure: TRANSURETHRAL RESECTION OF BLADDER TUMOR (TURBT);  Surgeon: Sondra Come, MD;  Location: [facility] ORS;  Service: Urology;  Laterality: N/A;    SOCIAL HISTORY: Social History   Socioeconomic History  . Marital status: Single    Spouse name: Not on file  . Number of children: 1  . Years of education: Not on file  . Highest education level: 10th grade  Occupational History  . Not on file  Tobacco Use  . Smoking status: Former Smoker    Quit date: 06/05/1991    Years since quitting: 29.1  . Smokeless tobacco: Never Used  Vaping Use  . Vaping Use: Never used  Substance and Sexual Activity  . Alcohol use: No  . Drug use: No  . Sexual activity: Never  Other Topics Concern  . Not on file  Social History  Narrative   Working full time   Social Determinants of Corporate investment banker Strain: Not on file  Food Insecurity: Not on file  Transportation Needs: Not on file  Physical Activity: Not on file  Stress: Not on file  Social Connections: Not on file  Intimate Partner Violence: Not on file    FAMILY HISTORY: Family History  Problem Relation Age of Onset  . Heart disease Mother   . Heart attack Mother   . Arthritis Father   . Diabetes Brother     ALLERGIES:  has No Known Allergies.  MEDICATIONS:  Current Outpatient Medications  Medication Sig Dispense Refill  . amLODipine (NORVASC) 5 MG tablet Take 1 tablet (5 mg total) by mouth daily. 90 tablet 1  . diclofenac sodium (VOLTAREN) 1 % GEL Apply 2 g topically 4 (four) times daily. (Patient taking differently: Apply 2 g topically as needed.) 100 g 2  . docusate sodium (COLACE) 100 MG capsule Take 1 capsule (100 mg total) by mouth daily. 90 capsule 0  . ferrous sulfate 325 (65 FE) MG EC tablet Take 325 mg by mouth daily with breakfast. OTC    . lidocaine-prilocaine (EMLA) cream Apply 1 application topically as needed. 30  g 3  . losartan (COZAAR) 25 MG tablet Take 1 tablet (25 mg total) by mouth daily. 90 tablet 1  . polyethylene glycol (MIRALAX / GLYCOLAX) 17 g packet Take 17 g by mouth daily as needed for mild constipation. 14 each 0  . rosuvastatin (CRESTOR) 40 MG tablet Take 1 tablet (40 mg total) by mouth daily. 90 tablet 1   No current facility-administered medications for this visit.   Facility-Administered Medications Ordered in Other Visits  Medication Dose Route Frequency Provider Last Rate Last Admin  . heparin lock flush 100 unit/mL  500 Units Intravenous Once [name], MD      . pembrolizumab [facility]) 200 mg in sodium chloride 0.9 % 50 mL chemo infusion  200 mg Intravenous Once [name], MD 116 mL/hr at 07/12/20 1052 200 mg at 07/12/20 1052     PHYSICAL EXAMINATION: ECOG PERFORMANCE STATUS: 1 - Symptomatic  but completely ambulatory Vitals:   07/12/20 0936  BP: (!) 145/75  Pulse: 86  Resp: 16  Temp: (!) 97.1 F (36.2 C)   Filed Weights   07/12/20 0936  Weight: 191 lb (86.6 kg)    Physical Exam Constitutional:      General: She is not in acute distress.    Comments: Walk independently  HENT:     Head: Normocephalic and atraumatic.  Eyes:     General: No scleral icterus.    Pupils: Pupils are equal, round, and reactive to light.  Cardiovascular:     Rate and Rhythm: Normal rate and regular rhythm.     Heart sounds: Normal heart sounds.  Pulmonary:     Effort: Pulmonary effort is normal. No respiratory distress.     Breath sounds: No wheezing.  Abdominal:     General: Bowel sounds are normal. There is no distension.     Palpations: Abdomen is soft. There is no mass.     Comments: Ureterostomy,   Musculoskeletal:        General: No deformity. Normal range of motion.     Cervical back: Normal range of motion and neck supple.  Skin:    General: Skin is warm and dry.     Coloration: Skin is not pale.     Findings: No erythema or rash.  Neurological:     Mental Status: She is alert and oriented to person, place, and time. Mental status is at baseline.     Cranial Nerves: No cranial nerve deficit.     Coordination: Coordination normal.  Psychiatric:        Mood and Affect: Mood normal.   .  RADIOGRAPHIC STUDIES: I have personally reviewed the radiological images as listed and agreed with the findings in the report.  CMP Latest Ref Rng & Units 07/12/2020  Glucose 70 - 99 mg/dL 170(H)  BUN 8 - 23 mg/dL 29(H)  Creatinine 0.44 - 1.00 mg/dL 1.56(H)  Sodium 135 - 145 mmol/L 140  Potassium 3.5 - 5.1 mmol/L 4.2  Chloride 98 - 111 mmol/L 106  CO2 22 - 32 mmol/L 23  Calcium 8.9 - 10.3 mg/dL 8.9  Total Protein 6.5 - 8.1 g/dL 7.5  Total Bilirubin 0.3 - 1.2 mg/dL 0.4  Alkaline Phos 38 - 126 U/L 101  AST 15 - 41 U/L 20  ALT 0 - 44 U/L 11   CBC Latest Ref Rng & Units 07/12/2020   WBC 4.0 - 10.5 K/uL 5.5  Hemoglobin 12.0 - 15.0 g/dL 9.2(L)  Hematocrit 36.0 - 46.0 % 29.3(L)  Platelets  150 - 400 K/uL 207    LABORATORY DATA:  I have reviewed the data as listed Lab Results  Component Value Date   WBC 5.5 07/12/2020   HGB 9.2 (L) 07/12/2020   HCT 29.3 (L) 07/12/2020   MCV 76.7 (L) 07/12/2020   PLT 207 07/12/2020   Recent Labs    02/09/20 0832 02/16/20 1307 03/01/20 1301 03/22/20 0846 05/31/20 0841 06/21/20 1011 07/12/20 0917  NA 138 139 140   < > 140 141 140  K 4.4 4.6 4.6   < > 3.9 4.2 4.2  CL 104 107 108   < > 108 108 106  CO2 21* 20* 22   < > _0 GLUCOSE 228* 163* 190*   < > 188* 181* 170*  BUN 41* 49* 33*   < > 40* 27* 29*  CREATININE 2.35* 2.41* 2.08*   < > 1.49* 1.40* 1.56*  CALCIUM 9.0 9.3 9.3   < > 9.2 9.2 8.9  GFRNONAA 20* 20* 24*   < > 38* 40* 36*  GFRAA 24* 23* 27*  --   --   --   --   PROT 7.9 8.6* 7.8   < > 7.7 7.7 7.5  ALBUMIN 4.3 4.5 4.3   < > 4.0 4.2 3.9  AST _1 < > _2 ALT _3 < > _4 ALKPHOS 100 86 87   < > 94 100 101  BILITOT 0.6 0.5 0.5   < > 0.5 0.3 0.4   < > = values in this interval not displayed.   Iron/TIBC/Ferritin/ %Sat    Component Value Date/Time   IRON 73 11/17/2019 0936   IRON 26 (L) 02/04/2019 1309   TIBC 316 11/17/2019 0936   TIBC 251 02/04/2019 1309   FERRITIN 256 11/17/2019 0936   FERRITIN 409 (H) 02/04/2019 1309   IRONPCTSAT 23 11/17/2019 0936   IRONPCTSAT 10 (L) 02/04/2019 1309    RADIOGRAPHIC STUDIES: I have personally reviewed the radiological images as listed and agreed with the findings in the report. CT CHEST ABDOMEN PELVIS WO CONTRAST  Result Date: 04/27/2020 CLINICAL DATA:  Bladder cancer restaging. Discomfort in the vicinity of the ileal conduit ostomy. EXAM: CT CHEST, ABDOMEN AND PELVIS WITHOUT CONTRAST TECHNIQUE: Multidetector CT imaging of the chest, abdomen and pelvis was performed following the standard protocol without IV contrast. COMPARISON:  01/06/2020  FINDINGS: CT CHEST FINDINGS Cardiovascular: Right Port-A-Cath tip: Right atrium. Coronary, aortic arch, and branch vessel atherosclerotic vascular disease. Aberrant right subclavian artery passes behind the esophagus. Mediastinum/Nodes: Contrast medium in the distal esophagus suggesting dysmotility or reflux. Lungs/Pleura: Centrilobular emphysema. Stable 3 mm right upper lobe pulmonary nodule on image 57 of series 3, no change from 08/07/2018. Speckled linear calcifications in the right lower lobe on image 117 of series 3, no change from 08/07/2018. No compelling findings of metastatic disease to the lungs. Musculoskeletal: Bilateral degenerative glenohumeral arthropathy, right greater than left. Thoracic spondylosis. CT ABDOMEN PELVIS FINDINGS Hepatobiliary: Unremarkable Pancreas: Unremarkable Spleen: Unremarkable Adrenals/Urinary Tract: Both adrenal glands appear normal. Fullness of both collecting systems without overt hydronephrosis. Mild right hydroureter. Ileal conduit noted. Cystectomy. Stomach/Bowel: Along the upper margin of the ileal conduit, there is a peristomal Richter hernia containing a small margin of adjacent small bowel. This is associated with wall thickening in an approximately 7 cm segment of bowel adjacent to the Richter hernia. There is adjacent abnormal stranding in the mesentery and  omentum. Prominent stool throughout the colon favors constipation. Vascular/Lymphatic: Aortoiliac atherosclerotic vascular disease. Stent in the right external iliac vein. Reproductive: Uterus absent. Adnexa difficult to separate from the adjacent pelvic sidewall, possible mild scarring along the region of lymph node dissection, but no progressive density is identified along the pelvic sidewalls or adnexa. Other: Small amount of pelvic ascites, similar to prior. Musculoskeletal: Grade 1 degenerative anterolisthesis of L4 on L5. The L5 vertebra is partially sacralized/transitional. Degenerative arthropathy of the  hips, left greater than right. IMPRESSION: 1. Along the upper margin of the ileal conduit, there is a peristomal Richter hernia containing a small margin of adjacent small bowel. This is associated with wall thickening in an approximately 7 cm segment of bowel adjacent to the Richter hernia. There is adjacent abnormal stranding in the mesentery and omentum. 2. Small amount of pelvic ascites, similar to prior. 3. Other imaging findings of potential clinical significance: Coronary atherosclerosis. Aberrant right subclavian artery passes behind the esophagus. Prominent stool throughout the colon favors constipation. Degenerative arthropathy of the hips, left greater than right. Grade 1 degenerative anterolisthesis of L4 on L5. 4. Emphysema and aortic atherosclerosis. Aortic Atherosclerosis (ICD10-I70.0) and Emphysema (ICD10-J43.9). Electronically Signed   By: Van Clines M.D.   On: 04/27/2020 10:57     ASSESSMENT & PLAN:  1. Metastatic urothelial carcinoma ([place])   2. Encounter for antineoplastic immunotherapy   3. Anemia in stage 3b chronic kidney disease ([place])   4. Parastomal hernia without obstruction or gangrene   5. Weight gain   6. Hypercalcemia   Cancer Staging Bladder carcinoma [facility]) Staging form: Urinary Bladder, AJCC 8th Edition - Clinical stage from 08/01/2018: Stage IVA (cTX, cN3, cM1a) - Signed by [name], MD on 04/21/2019  #Metastatic urothelial carcinoma of bladder, sarcomatoid features pelvic sidewall mass biopsy showed metastatic carcinoma consistent with involvement by urothelial carcinoma. Labs are reviewed and discussed with patient. Proceed with Keytruda treatment today.  #Hernia -parastomal hernia.  Discussed with urology Dr. Bernardo Heater who agrees with me that her chronic abdomen discomfort may be secondary to the hernia.  Her [facility] urologist has retired recently.  We did contact their office and was recommended to send the patient to general surgery for evaluation. She has  been referred to [facility] hernia center for further evaluation.  #Chronic kidney disease, stable creatinine.  Encourage oral hydration.  Avoid nephrotoxin.  #Chronic anemia secondary to stage III CKD.  Hemoglobin remained stable but low. Patient declines IV Venofer treatments we talked about option of IV Venofer treatment today again.  We will repeat iron panel at the next visit.  #Constipation, continue current bowel regimen #Skin burning sensation.  Questionable neuralgia.  Skin is intact no rash.  Advised patient to apply topical lidocaine cream and see if that improves her symptoms #History of diabetes.  Patient has persistent hypoglycemia.  A1c few months ago was 7.8. She is not currently on any diabetic medication.  A1c improved when she lost the weight previously.  Now she has continued to have weight gain and A1c also improved creased.  I encourage patient to further discuss with primary care provider for resuming diabetic medication and further discuss about weight loss management. Discussed with her in details about lifestyle modification, including exercise, healthy diet. #Weight gain, TSH has been monitored.  Last TSH was normal.  Will check TSH today.  #Follow-up for evaluation prior to next [facility] treatment.  In 3 weeks All questions were answered. The patient knows to call the clinic with any  problems questions or concerns.  [name], MD, PhD Hematology Oncology [facility] Pager- [phone] 07/12/20

## 2020-07-12 NOTE — Progress Notes (Signed)
Patient has new spots under right breast that are painful with a burning sensation.

## 2020-07-12 NOTE — Progress Notes (Signed)
Patient tolerated treatment well. Discharged home.  

## 2020-07-12 NOTE — Telephone Encounter (Signed)
PA for lidocaine-prilocaine submitted to [facility] via [email or web address].   KeyMassie Kluver PA Case ID: [id number] Rx #: [id number]

## 2020-07-13 NOTE — Telephone Encounter (Signed)
Partial approval received for quantity limit of 30 grams per 30 days from 07/12/20 to 10/10/20. Approval faxed to [place].

## 2020-08-02 ENCOUNTER — Telehealth: Payer: Self-pay

## 2020-08-02 ENCOUNTER — Encounter: Payer: Self-pay | Admitting: Oncology

## 2020-08-02 ENCOUNTER — Inpatient Hospital Stay (HOSPITAL_BASED_OUTPATIENT_CLINIC_OR_DEPARTMENT_OTHER): Payer: Medicare Other | Admitting: Oncology

## 2020-08-02 ENCOUNTER — Inpatient Hospital Stay: Payer: Medicare Other

## 2020-08-02 ENCOUNTER — Inpatient Hospital Stay: Payer: Medicare Other | Attending: Oncology

## 2020-08-02 VITALS — BP 148/69 | HR 108 | Temp 97.9°F | Resp 16 | Wt 209.9 lb

## 2020-08-02 VITALS — HR 98

## 2020-08-02 DIAGNOSIS — I700 Atherosclerosis of aorta: Secondary | ICD-10-CM | POA: Diagnosis not present

## 2020-08-02 DIAGNOSIS — K469 Unspecified abdominal hernia without obstruction or gangrene: Secondary | ICD-10-CM | POA: Diagnosis not present

## 2020-08-02 DIAGNOSIS — C7910 Secondary malignant neoplasm of unspecified urinary organs: Secondary | ICD-10-CM

## 2020-08-02 DIAGNOSIS — K435 Parastomal hernia without obstruction or  gangrene: Secondary | ICD-10-CM | POA: Insufficient documentation

## 2020-08-02 DIAGNOSIS — D631 Anemia in chronic kidney disease: Secondary | ICD-10-CM | POA: Diagnosis not present

## 2020-08-02 DIAGNOSIS — M25562 Pain in left knee: Secondary | ICD-10-CM

## 2020-08-02 DIAGNOSIS — N1832 Chronic kidney disease, stage 3b: Secondary | ICD-10-CM | POA: Insufficient documentation

## 2020-08-02 DIAGNOSIS — C679 Malignant neoplasm of bladder, unspecified: Secondary | ICD-10-CM | POA: Insufficient documentation

## 2020-08-02 DIAGNOSIS — I129 Hypertensive chronic kidney disease with stage 1 through stage 4 chronic kidney disease, or unspecified chronic kidney disease: Secondary | ICD-10-CM | POA: Diagnosis not present

## 2020-08-02 DIAGNOSIS — Z5112 Encounter for antineoplastic immunotherapy: Secondary | ICD-10-CM | POA: Insufficient documentation

## 2020-08-02 DIAGNOSIS — R635 Abnormal weight gain: Secondary | ICD-10-CM

## 2020-08-02 DIAGNOSIS — E11649 Type 2 diabetes mellitus with hypoglycemia without coma: Secondary | ICD-10-CM | POA: Diagnosis not present

## 2020-08-02 DIAGNOSIS — E1122 Type 2 diabetes mellitus with diabetic chronic kidney disease: Secondary | ICD-10-CM | POA: Insufficient documentation

## 2020-08-02 DIAGNOSIS — R630 Anorexia: Secondary | ICD-10-CM | POA: Diagnosis not present

## 2020-08-02 DIAGNOSIS — Z87891 Personal history of nicotine dependence: Secondary | ICD-10-CM | POA: Diagnosis not present

## 2020-08-02 DIAGNOSIS — Z79899 Other long term (current) drug therapy: Secondary | ICD-10-CM | POA: Insufficient documentation

## 2020-08-02 LAB — COMPREHENSIVE METABOLIC PANEL
ALT: 10 U/L (ref 0–44)
AST: 22 U/L (ref 15–41)
Albumin: 4 g/dL (ref 3.5–5.0)
Alkaline Phosphatase: 106 U/L (ref 38–126)
Anion gap: 13 (ref 5–15)
BUN: 31 mg/dL — ABNORMAL HIGH (ref 8–23)
CO2: 23 mmol/L (ref 22–32)
Calcium: 9 mg/dL (ref 8.9–10.3)
Chloride: 105 mmol/L (ref 98–111)
Creatinine, Ser: 1.59 mg/dL — ABNORMAL HIGH (ref 0.44–1.00)
GFR, Estimated: 35 mL/min — ABNORMAL LOW (ref >60)
Glucose, Bld: 237 mg/dL — ABNORMAL HIGH (ref 70–99)
Potassium: 4 mmol/L (ref 3.5–5.1)
Sodium: 141 mmol/L (ref 135–145)
Total Bilirubin: 0.7 mg/dL (ref 0.3–1.2)
Total Protein: 7.5 g/dL (ref 6.5–8.1)

## 2020-08-02 LAB — CBC WITH DIFFERENTIAL/PLATELET
Abs Immature Granulocytes: 0.03 10*3/uL (ref 0.00–0.07)
Basophils Absolute: 0 10*3/uL (ref 0.0–0.1)
Basophils Relative: 1 %
Eosinophils Absolute: 0.1 10*3/uL (ref 0.0–0.5)
Eosinophils Relative: 2 %
HCT: 30.8 % — ABNORMAL LOW (ref 36.0–46.0)
Hemoglobin: 9.5 g/dL — ABNORMAL LOW (ref 12.0–15.0)
Immature Granulocytes: 1 %
Lymphocytes Relative: 26 %
Lymphs Abs: 1.4 10*3/uL (ref 0.7–4.0)
MCH: 24.2 pg — ABNORMAL LOW (ref 26.0–34.0)
MCHC: 30.8 g/dL (ref 30.0–36.0)
MCV: 78.4 fL — ABNORMAL LOW (ref 80.0–100.0)
Monocytes Absolute: 0.4 10*3/uL (ref 0.1–1.0)
Monocytes Relative: 6 %
Neutro Abs: 3.6 10*3/uL (ref 1.7–7.7)
Neutrophils Relative %: 64 %
Platelets: 213 10*3/uL (ref 150–400)
RBC: 3.93 MIL/uL (ref 3.87–5.11)
RDW: 14.1 % (ref 11.5–15.5)
WBC: 5.5 10*3/uL (ref 4.0–10.5)
nRBC: 0 % (ref 0.0–0.2)

## 2020-08-02 LAB — FERRITIN: Ferritin: 197 ng/mL (ref 11–307)

## 2020-08-02 LAB — IRON AND TIBC
Iron: 68 ug/dL (ref 28–170)
Saturation Ratios: 22 % (ref 10.4–31.8)
TIBC: 312 ug/dL (ref 250–450)
UIBC: 244 ug/dL

## 2020-08-02 LAB — HEMOGLOBIN A1C
Hgb A1c MFr Bld: 8.4 % — ABNORMAL HIGH (ref 4.8–5.6)
Mean Plasma Glucose: 194.38 mg/dL

## 2020-08-02 MED ORDER — HEPARIN SOD (PORK) LOCK FLUSH 100 UNIT/ML IV SOLN
INTRAVENOUS | Status: AC
  Filled 2020-08-02: qty 5

## 2020-08-02 MED ORDER — SODIUM CHLORIDE 0.9 % IV SOLN
200.0000 mg | Freq: Once | INTRAVENOUS | Status: AC
  Administered 2020-08-02: 200 mg via INTRAVENOUS
  Filled 2020-08-02: qty 8

## 2020-08-02 MED ORDER — SODIUM CHLORIDE 0.9 % IV SOLN
Freq: Once | INTRAVENOUS | Status: AC
  Filled 2020-08-02: qty 250

## 2020-08-02 MED ORDER — HEPARIN SOD (PORK) LOCK FLUSH 100 UNIT/ML IV SOLN
500.0000 [IU] | Freq: Once | INTRAVENOUS | Status: AC | PRN
  Administered 2020-08-02: 500 [IU]
  Filled 2020-08-02: qty 5

## 2020-08-02 NOTE — Progress Notes (Signed)
Hematology/Oncology follow up note [facility] [phone] [phone]   Patient Care Team: [name], NP as PCP - General Nice, [name], OD (Optometry) [name], MD as Consulting Physician (Hematology and Oncology)  REFERRING PROVIDER: Dr.Sninsky CHIEF COMPLAINTS/REASON FOR VISIT:  Follow up for bladder cancer, anemia.   HISTORY OF PRESENTING ILLNESS:  Angela Russell is a  71 y.o.  female with PMH listed below who was referred to me for evaluation of newly diagnosed bladder cancer. Patient initially presented to emergency room at the end of January 2020 for evaluation of dysuria, hematuria and the left lower quadrant inguinal pain and flank pain.  1/28 2020 CT renal stone study showed suspected irregular wall thickening about the superior bladder, not well assessed due to degree of bladder distention.  Recommend cystoscopy for further evaluation.  No renal stone or obstructive uropathy.  Patient was given IV Rocephin and referred patient for outpatient urology follow-up.  Urine culture was negative.  She again presented to ER after 2 days with similar symptoms.  Patient has 25-pack-year smoking history, quit approximately 20 years ago.  No family history of any urology malignancies 07/03/2018 another CT abdomen pelvis with contrast was done which showed no nephrolithiasis or hydronephrosis is identified.  Bladder is decompressed limiting evaluation.  07/16/2018.urology Dr. [name] - cystoscopy and bilateral retrograde pyelogram on 07/16/2018.  Pyelogram did not show any filling defect or abnormalities.  No hydronephrosis.  Ureteral orifice was not involved with tumor.  There is a large 5 cm posterior wall bladder tumor, bullous and sessile appearing.  Patient underwent TURBT.   Pathology: High-grade urothelial carcinoma, invasive into muscularis propria.  Lymphovascular invasion is present.  Carcinoma in situ is also identified.  Focal squamous  differentiation is noted, areas of invasive carcinoma display pleomorphic/sarcomatoid changes.  T2b  08/07/2018 CT without contrast negative.  2 subpleural right upper lobe nodule 2 to 3 mm likely benign. She also had baseline audiometry done.  08/04/2018 ddMVAC x 1 cycle, stopped due to intolerance and AKI.  Patient received 1 cycle of dd MVAC, not able to tolerate due to AKI. Patient then was referred to [facility] urology  09/22/2018 patient underwent a cystectomy, pathology pT3a N0 Mx.  11 lymph nodes were harvested and was all negative. Invasive urothelia carcinoma, high grade, with sarcomatoid features.   10/14/2018 patient was admitted due to pyelonephritis and a pelvic fluid collection.  Drain was placed. 10/23/2018 drain was removed. Patient has had difficulties getting to her appointments to [facility]. 02/09/2019, patient presented with abdominal pain. CT concerning for small bowel obstruction and increased size of right pelvic fluid collection concerning for cancer recurrence.  Right hydronephrosis to the level of pelvis and enlarged lymph nodes. Patient had JP drain placed with CT guidance to pelvic fluid collection and drained 400 cc amber fluid.  Culture was negative for growth of microorganisms and cytology was negative for malignancy.-JP drain was removed on the day of discharge. CT-guided core biopsy of pelvic lymph node adenopathy was attempted but not successful.  # Right lower extremity DVT, provoked by Transvenous biopsy  02/26/2019 transvenous biopsy by IR unsuccessful transvenous biopsy of the right pelvis mass. Post biopsy acute thrombus in the right external iliac and common femoral vein.  Patient was recommended to start anticoagulation with Xarelto however due to the co-pay, patient is not able to afford the medication. Anticoagulation regimen was switched to Eliquis 5 mg.  Patient reports that she has been taking it once a  day. 03/20/2019 PET showed FDG avid tissue in the  cystectomy bed, retroperitoneal and pelvic lymphadenopathy.  Right common iliac DVT.  # increased right lower extremity swelling.  She was started on Eliquis for anticoagulation by [facility].  We clarified with her pharmacy and she was actually taking Eliquis 2.5 mg twice daily. Right lower extremity swelling has not improved but instead worsened. 03/16/2019 She had ultrasound right lower extremity done which showed persistent extensive proximal right lower extremity DVT.  Anticoagulation regimen has increased to Eliquis 5 mg twice daily.  # establish care with [facility] oncology Dr. [name] for evaluation.  Dr. [name] recommended starting immunotherapy with PD-L1 inhibitor Pembrolizumab 200 mg every 3 weeks.  The sarcomatoid histology may not respond to chemotherapy well, could portend a better chance of response into immunotherapy PET scan after 4 cycles of Keytruda showed single mildly enlarged central mesenteric lymph node in the upper pelvis with SUV 9.6.  No other abnormal hypermetabolic activity was reported.  # #Right lower extremity  femoral vein DVT provoked by transvenous biopsy in the context of cancer recurrence, Status post IV C filter and mechanical thrombectomy.  IVC filter has been removed. She is currently off anticoagulation after she developed hematuria.  # 03/30/2019 Status post IVC filter placement, mechanical thrombectomy.-IVC filter was retrieved in January 2021. # PD-L1 CPS 100%.  # 03/31/2019 started on immunotherapy Keytruda.  # 04/26/2020 CT chest abdomen pelvis was reviewed and discussed with patient. No definitive finding of disease recurrence or metastasis.  Small amount of ascites similar to prior. There is a parastomal hernia containing a small margin of adjacent small bowel.  This is associated with wall thickening in approximately 7 cm segment of bowel adjacent to the hernia.  There is adjacent abnormal stranding of the mesentery and omentum.   INTERVAL HISTORY Angela Russell is a 70 y.o. female who has above history reviewed by me today presents for follow up visit for evaluation form metastatic high-grade urothelial carcinoma of the bladder. Sarcomatoid feature.  Patient has been on maintenance immunotherapy. She has an appointment with [facility] hernia center in March 2022. Continues to have weight gain.  She has gained 18 pounds since last visit 3 weeks ago She reports a attention to her diet and exercising 3 times a week. She admits not following diabetic diet.  Blood glucose is high at 237. Chronic abdomen discomfort due to hernia.  She reports that it is difficult for her to keep every 3-week schedule Keytruda maintenance immunotherapy due to her work schedule.  Left knee pain.  Review of Systems  Constitutional: Negative for appetite change, chills, fatigue and fever.  HENT:   Negative for hearing loss and voice change.   Eyes: Negative for eye problems.  Respiratory: Negative for chest tightness and cough.   Cardiovascular: Negative for chest pain and leg swelling.  Gastrointestinal: Negative for abdominal distention, abdominal pain and blood in stool.       Abdominal discomfort.   Endocrine: Negative for hot flashes.  Genitourinary: Negative for difficulty urinating and frequency.   Musculoskeletal: Negative for arthralgias.  Skin: Negative for itching and rash.       No rash in the right breast.  A few areas of skin hyperpigmentation.  Skin is intact  Neurological: Positive for numbness. Negative for extremity weakness.  Hematological: Negative for adenopathy.  Psychiatric/Behavioral: Negative for confusion. The patient is not nervous/anxious.     MEDICAL HISTORY:  Past Medical History:  Diagnosis Date  . Carotid artery  plaque, right 01/2014  . CKD (chronic kidney disease)    stage 2-3  . Diabetes mellitus without complication ([place])   . DM (diabetes mellitus), type 2, uncontrolled ([place])   . Hyperlipidemia   . Hypertension   .  Hypochromic microcytic anemia    mild  . Metastatic urothelial carcinoma ([place]) 03/23/2019  . Osteoporosis   . Renal insufficiency   . Rotator cuff tendonitis, right     SURGICAL HISTORY: Past Surgical History:  Procedure Laterality Date  . ABDOMINAL HYSTERECTOMY  2000   due to bleeding and fibroids, partial- still has ovaries  . CYSTOSCOPY W/ RETROGRADES Bilateral 07/16/2018   Procedure: CYSTOSCOPY WITH RETROGRADE PYELOGRAM;  Surgeon: Billey Co, MD;  Location: [facility] ORS;  Service: Urology;  Laterality: Bilateral;  . IVC FILTER INSERTION N/A 03/30/2019   Procedure: IVC FILTER INSERTION;  Surgeon: Algernon Huxley, MD;  Location: [place] [facility];  Service: Cardiovascular;  Laterality: N/A;  . IVC FILTER REMOVAL N/A 06/15/2019   Procedure: IVC FILTER REMOVAL;  Surgeon: Algernon Huxley, MD;  Location: [place] [facility];  Service: Cardiovascular;  Laterality: N/A;  . KNEE SURGERY Left 03/17/2013   torn meniscus  . PERIPHERAL VASCULAR THROMBECTOMY Right 03/30/2019   Procedure: PERIPHERAL VASCULAR THROMBECTOMY;  Surgeon: Algernon Huxley, MD;  Location: [place] [facility];  Service: Cardiovascular;  Laterality: Right;  . PORTA CATH INSERTION N/A 08/06/2018   Procedure: PORTA CATH INSERTION;  Surgeon: Algernon Huxley, MD;  Location: [place] [facility];  Service: Cardiovascular;  Laterality: N/A;  . TRANSURETHRAL RESECTION OF BLADDER TUMOR N/A 07/16/2018   Procedure: TRANSURETHRAL RESECTION OF BLADDER TUMOR (TURBT);  Surgeon: Billey Co, MD;  Location: [facility] OR[facility];  Service: Urology;  Laterality: N/A;    SOCIAL HISTORY: Social History   Socioeconomic History  . Marital status: Single    Spouse name: Not on file  . Number of children: 1  . Years of education: Not on file  . Highest education level: 10th grade  Occupational History  . Not on file  Tobacco Use  . Smoking status: Former Smoker    Quit date: 06/05/1991    Years since quitting: 29.1  . Smokeless tobacco: Never Used   Vaping Use  . Vaping Use: Never used  Substance and Sexual Activity  . Alcohol use: No  . Drug use: No  . Sexual activity: Never  Other Topics Concern  . Not on file  Social History Narrative   Working full time   Social Determinants of Radio broadcast assistant Strain: Not on file  Food Insecurity: Not on file  Transportation Needs: Not on file  Physical Activity: Not on file  Stress: Not on file  Social Connections: Not on file  Intimate Partner Violence: Not on file    FAMILY HISTORY: Family History  Problem Relation Age of Onset  . Heart disease Mother   . Heart attack Mother   . Arthritis Father   . Diabetes Brother     ALLERGIES:  has No Known Allergies.  MEDICATIONS:  Current Outpatient Medications  Medication Sig Dispense Refill  . amLODipine (NORVASC) 5 MG tablet Take 1 tablet (5 mg total) by mouth daily. 90 tablet 1  . diclofenac sodium (VOLTAREN) 1 % GEL Apply 2 g topically 4 (four) times daily. (Patient taking differently: Apply 2 g topically as needed.) 100 g 2  . docusate sodium (COLACE) 100 MG capsule Take 1 capsule (100 mg total) by mouth daily. 90 capsule  0  . ferrous sulfate 325 (65 FE) MG EC tablet Take 325 mg by mouth daily with breakfast. OTC    . lidocaine-prilocaine (EMLA) cream Apply 1 application topically as needed. 30 g 3  . losartan (COZAAR) 25 MG tablet Take 1 tablet (25 mg total) by mouth daily. 90 tablet 1  . polyethylene glycol (MIRALAX / GLYCOLAX) 17 g packet Take 17 g by mouth daily as needed for mild constipation. 14 each 0  . rosuvastatin (CRESTOR) 40 MG tablet Take 1 tablet (40 mg total) by mouth daily. 90 tablet 1   No current facility-administered medications for this visit.     PHYSICAL EXAMINATION: ECOG PERFORMANCE STATUS: 1 - Symptomatic but completely ambulatory Vitals:   08/02/20 1308  BP: (!) 148/69  Pulse: (!) 108  Resp: 16  Temp: 97.9 F (36.6 C)   Filed Weights   08/02/20 1308  Weight: 209 lb 14.4 oz  (95.2 kg)    Physical Exam Constitutional:      General: She is not in acute distress.    Comments: Walk independently  HENT:     Head: Normocephalic and atraumatic.  Eyes:     General: No scleral icterus.    Pupils: Pupils are equal, round, and reactive to light.  Cardiovascular:     Rate and Rhythm: Normal rate and regular rhythm.     Heart sounds: Normal heart sounds.  Pulmonary:     Effort: Pulmonary effort is normal. No respiratory distress.     Breath sounds: No wheezing.  Abdominal:     General: Bowel sounds are normal. There is no distension.     Palpations: Abdomen is soft. There is no mass.     Comments: Ureterostomy,   Musculoskeletal:        General: No deformity. Normal range of motion.     Cervical back: Normal range of motion and neck supple.     Comments: Left knee swelling and tender  Skin:    General: Skin is warm and dry.     Coloration: Skin is not pale.     Findings: No erythema or rash.  Neurological:     Mental Status: She is alert and oriented to person, place, and time. Mental status is at baseline.     Cranial Nerves: No cranial nerve deficit.     Coordination: Coordination normal.  Psychiatric:        Mood and Affect: Mood normal.   .  RADIOGRAPHIC STUDIES: I have personally reviewed the radiological images as listed and agreed with the findings in the report.  CMP Latest Ref Rng & Units 07/12/2020  Glucose 70 - 99 mg/dL 170(H)  BUN 8 - 23 mg/dL 29(H)  Creatinine 0.44 - 1.00 mg/dL 1.56(H)  Sodium 135 - 145 mmol/L 140  Potassium 3.5 - 5.1 mmol/L 4.2  Chloride 98 - 111 mmol/L 106  CO2 22 - 32 mmol/L 23  Calcium 8.9 - 10.3 mg/dL 8.9  Total Protein 6.5 - 8.1 g/dL 7.5  Total Bilirubin 0.3 - 1.2 mg/dL 0.4  Alkaline Phos 38 - 126 U/L 101  AST 15 - 41 U/L 20  ALT 0 - 44 U/L 11   CBC Latest Ref Rng & Units 08/02/2020  WBC 4.0 - 10.5 K/uL 5.5  Hemoglobin 12.0 - 15.0 g/dL 9.5(L)  Hematocrit 36.0 - 46.0 % 30.8(L)  Platelets 150 - 400 K/uL 213     LABORATORY DATA:  I have reviewed the data as listed Lab Results  Component Value Date  WBC 5.5 08/02/2020   HGB 9.5 (L) 08/02/2020   HCT 30.8 (L) 08/02/2020   MCV 78.4 (L) 08/02/2020   PLT 213 08/02/2020   Recent Labs    02/09/20 0832 02/16/20 1307 03/01/20 1301 03/22/20 0846 05/31/20 0841 06/21/20 1011 07/12/20 0917  NA 138 139 140   < > 140 141 140  K 4.4 4.6 4.6   < > 3.9 4.2 4.2  CL 104 107 108   < > 108 108 106  CO2 21* 20* 22   < > _0 GLUCOSE 228* 163* 190*   < > 188* 181* 170*  BUN 41* 49* 33*   < > 40* 27* 29*  CREATININE 2.35* 2.41* 2.08*   < > 1.49* 1.40* 1.56*  CALCIUM 9.0 9.3 9.3   < > 9.2 9.2 8.9  GFRNONAA 20* 20* 24*   < > 38* 40* 36*  GFRAA 24* 23* 27*  --   --   --   --   PROT 7.9 8.6* 7.8   < > 7.7 7.7 7.5  ALBUMIN 4.3 4.5 4.3   < > 4.0 4.2 3.9  AST _1 < > _2 ALT _3 < > _4 ALKPHOS 100 86 87   < > 94 100 101  BILITOT 0.6 0.5 0.5   < > 0.5 0.3 0.4   < > = values in this interval not displayed.   Iron/TIBC/Ferritin/ %Sat    Component Value Date/Time   IRON 73 11/17/2019 0936   IRON 26 (L) 02/04/2019 1309   TIBC 316 11/17/2019 0936   TIBC 251 02/04/2019 1309   FERRITIN 256 11/17/2019 0936   FERRITIN 409 (H) 02/04/2019 1309   IRONPCTSAT 23 11/17/2019 0936   IRONPCTSAT 10 (L) 02/04/2019 1309    RADIOGRAPHIC STUDIES: I have personally reviewed the radiological images as listed and agreed with the findings in the report. No results found.   ASSESSMENT & PLAN:  1. Metastatic urothelial carcinoma ([place])   2. Encounter for antineoplastic immunotherapy   3. Anemia in stage 3b chronic kidney disease ([place])   4. Weight gain   5. Abdominal hernia without obstruction and without gangrene, recurrence not specified, unspecified hernia type   6. Arthralgia of left knee   Cancer Staging Bladder carcinoma [facility]) Staging form: Urinary Bladder, AJCC 8th Edition - Clinical stage from 08/01/2018: Stage IVA (cTX, cN3,  cM1a) - Signed by [name], MD on 04/21/2019  #Metastatic urothelial carcinoma of bladder, sarcomatoid features pelvic sidewall mass biopsy showed metastatic carcinoma consistent with involvement by urothelial carcinoma. Labs are reviewed and discussed with patient. Proceed with Keytruda today.  Obtain surveillance CT scan prior to next visit.  #Hernia -parastomal hernia.   She has been referred to [facility] hernia center for further evaluation.  #Chronic kidney disease, stable creatinine.  Encourage oral hydration.  Avoid nephrotoxin.  #Chronic anemia secondary to stage III CKD.  Hemoglobin remained stable but low. Patient declines IV Venofer treatments we talked about option of IV Venofer treatment today again.  We will repeat iron panel at the next visit.  #History of diabetes.  Patient has persistent hypoglycemia.  A1c few months ago was 7.8. Hyperglycemia.  I urged patient to schedule an appointment to follow-up with primary care provider to restart diabetic regimen.  Discussed with her about strict diabetic diet.  #Weight gain.  TSH has been monitored and has been normal.  Refer to nutritionist  to discuss about diabetic diet.   #Left knee pain, swelling.  Likely DJD.  Or immunotherapy toxicity.  Advised patient to apply Voltaren gel.  Tylenol as needed.  #Follow-up for evaluation prior to next Keytruda treatment.-Patient prefers to schedule the next treatment in 4 weeks All questions were answered. The patient knows to call the clinic with any problems questions or concerns.  [name], MD, PhD Hematology Oncology [facility] Pager- [phone] 08/02/20

## 2020-08-02 NOTE — Telephone Encounter (Signed)
-----   Message from [name], MD sent at 08/02/2020  4:30 PM EST ----- Please arrange patient to have CT chest abdomen pelvis without contrast prior to the next visit.  Reason is [facility] follow-up.  Thank you

## 2020-08-02 NOTE — Telephone Encounter (Signed)
Please schedule CT per MD recommendation and notify pt of appt.

## 2020-08-02 NOTE — Telephone Encounter (Signed)
Done... 1st avail CT is 08/19/20 Pt CT was sched on 08/19/20  NPO 4hrs prior, must pick up prep.

## 2020-08-02 NOTE — Progress Notes (Signed)
Patient reports no improving with right side abdominal burning sensation.  Also having new left knee swelling.

## 2020-08-11 DIAGNOSIS — K4090 Unilateral inguinal hernia, without obstruction or gangrene, not specified as recurrent: Secondary | ICD-10-CM | POA: Diagnosis not present

## 2020-08-11 DIAGNOSIS — K435 Parastomal hernia without obstruction or  gangrene: Secondary | ICD-10-CM | POA: Diagnosis not present

## 2020-08-19 ENCOUNTER — Other Ambulatory Visit: Payer: Self-pay

## 2020-08-19 ENCOUNTER — Ambulatory Visit
Admission: RE | Admit: 2020-08-19 | Discharge: 2020-08-19 | Disposition: A | Discharge status: Home / Self Care | Payer: Medicare Other | Source: Ambulatory Visit | Attending: Oncology | Admitting: Oncology

## 2020-08-19 DIAGNOSIS — I700 Atherosclerosis of aorta: Secondary | ICD-10-CM | POA: Diagnosis not present

## 2020-08-19 DIAGNOSIS — C679 Malignant neoplasm of bladder, unspecified: Secondary | ICD-10-CM | POA: Diagnosis not present

## 2020-08-19 DIAGNOSIS — Z87891 Personal history of nicotine dependence: Secondary | ICD-10-CM | POA: Insufficient documentation

## 2020-08-19 DIAGNOSIS — J439 Emphysema, unspecified: Secondary | ICD-10-CM | POA: Diagnosis not present

## 2020-08-19 DIAGNOSIS — C7911 Secondary malignant neoplasm of bladder: Secondary | ICD-10-CM | POA: Insufficient documentation

## 2020-08-19 DIAGNOSIS — I2510 Atherosclerotic heart disease of native coronary artery without angina pectoris: Secondary | ICD-10-CM | POA: Diagnosis not present

## 2020-08-19 DIAGNOSIS — J984 Other disorders of lung: Secondary | ICD-10-CM | POA: Insufficient documentation

## 2020-08-19 DIAGNOSIS — C7910 Secondary malignant neoplasm of unspecified urinary organs: Secondary | ICD-10-CM | POA: Diagnosis present

## 2020-08-19 DIAGNOSIS — I318 Other specified diseases of pericardium: Secondary | ICD-10-CM | POA: Diagnosis not present

## 2020-08-19 DIAGNOSIS — Z906 Acquired absence of other parts of urinary tract: Secondary | ICD-10-CM | POA: Insufficient documentation

## 2020-08-19 DIAGNOSIS — M160 Bilateral primary osteoarthritis of hip: Secondary | ICD-10-CM | POA: Diagnosis not present

## 2020-08-22 ENCOUNTER — Other Ambulatory Visit: Payer: Self-pay

## 2020-08-22 MED ORDER — LOSARTAN POTASSIUM 25 MG PO TABS: 25.0000 mg | ORAL_TABLET | Freq: Every day | ORAL | 0 refills | Status: DC

## 2020-08-29 ENCOUNTER — Other Ambulatory Visit: Payer: Self-pay

## 2020-08-29 DIAGNOSIS — C7910 Secondary malignant neoplasm of unspecified urinary organs: Secondary | ICD-10-CM

## 2020-08-30 ENCOUNTER — Encounter: Payer: Self-pay | Admitting: Oncology

## 2020-08-30 ENCOUNTER — Inpatient Hospital Stay (HOSPITAL_BASED_OUTPATIENT_CLINIC_OR_DEPARTMENT_OTHER): Payer: Medicare Other | Admitting: Oncology

## 2020-08-30 ENCOUNTER — Inpatient Hospital Stay: Payer: Medicare Other

## 2020-08-30 VITALS — BP 196/82 | HR 91 | Temp 97.2°F | Resp 16 | Wt 210.6 lb

## 2020-08-30 DIAGNOSIS — Z5112 Encounter for antineoplastic immunotherapy: Secondary | ICD-10-CM

## 2020-08-30 DIAGNOSIS — C7910 Secondary malignant neoplasm of unspecified urinary organs: Secondary | ICD-10-CM

## 2020-08-30 DIAGNOSIS — R630 Anorexia: Secondary | ICD-10-CM | POA: Diagnosis not present

## 2020-08-30 DIAGNOSIS — D631 Anemia in chronic kidney disease: Secondary | ICD-10-CM | POA: Diagnosis not present

## 2020-08-30 DIAGNOSIS — R635 Abnormal weight gain: Secondary | ICD-10-CM

## 2020-08-30 DIAGNOSIS — K435 Parastomal hernia without obstruction or  gangrene: Secondary | ICD-10-CM | POA: Diagnosis not present

## 2020-08-30 DIAGNOSIS — N1832 Chronic kidney disease, stage 3b: Secondary | ICD-10-CM

## 2020-08-30 DIAGNOSIS — I129 Hypertensive chronic kidney disease with stage 1 through stage 4 chronic kidney disease, or unspecified chronic kidney disease: Secondary | ICD-10-CM | POA: Diagnosis not present

## 2020-08-30 DIAGNOSIS — C679 Malignant neoplasm of bladder, unspecified: Secondary | ICD-10-CM | POA: Diagnosis not present

## 2020-08-30 DIAGNOSIS — I10 Essential (primary) hypertension: Secondary | ICD-10-CM

## 2020-08-30 LAB — COMPREHENSIVE METABOLIC PANEL
ALT: 14 U/L (ref 0–44)
AST: 23 U/L (ref 15–41)
Albumin: 4.2 g/dL (ref 3.5–5.0)
Alkaline Phosphatase: 95 U/L (ref 38–126)
Anion gap: 12 (ref 5–15)
BUN: 34 mg/dL — ABNORMAL HIGH (ref 8–23)
CO2: 20 mmol/L — ABNORMAL LOW (ref 22–32)
Calcium: 9 mg/dL (ref 8.9–10.3)
Chloride: 106 mmol/L (ref 98–111)
Creatinine, Ser: 1.63 mg/dL — ABNORMAL HIGH (ref 0.44–1.00)
GFR, Estimated: 34 mL/min — ABNORMAL LOW (ref >60)
Glucose, Bld: 192 mg/dL — ABNORMAL HIGH (ref 70–99)
Potassium: 4 mmol/L (ref 3.5–5.1)
Sodium: 138 mmol/L (ref 135–145)
Total Bilirubin: 0.5 mg/dL (ref 0.3–1.2)
Total Protein: 7.8 g/dL (ref 6.5–8.1)

## 2020-08-30 LAB — CBC WITH DIFFERENTIAL/PLATELET
Abs Immature Granulocytes: 0.01 10*3/uL (ref 0.00–0.07)
Basophils Absolute: 0 10*3/uL (ref 0.0–0.1)
Basophils Relative: 1 %
Eosinophils Absolute: 0.1 10*3/uL (ref 0.0–0.5)
Eosinophils Relative: 2 %
HCT: 31.5 % — ABNORMAL LOW (ref 36.0–46.0)
Hemoglobin: 9.8 g/dL — ABNORMAL LOW (ref 12.0–15.0)
Immature Granulocytes: 0 %
Lymphocytes Relative: 25 %
Lymphs Abs: 1.2 10*3/uL (ref 0.7–4.0)
MCH: 24 pg — ABNORMAL LOW (ref 26.0–34.0)
MCHC: 31.1 g/dL (ref 30.0–36.0)
MCV: 77 fL — ABNORMAL LOW (ref 80.0–100.0)
Monocytes Absolute: 0.5 10*3/uL (ref 0.1–1.0)
Monocytes Relative: 10 %
Neutro Abs: 3.1 10*3/uL (ref 1.7–7.7)
Neutrophils Relative %: 62 %
Platelets: 202 10*3/uL (ref 150–400)
RBC: 4.09 MIL/uL (ref 3.87–5.11)
RDW: 14.2 % (ref 11.5–15.5)
WBC: 4.9 10*3/uL (ref 4.0–10.5)
nRBC: 0 % (ref 0.0–0.2)

## 2020-08-30 LAB — TSH: TSH: 2.028 u[IU]/mL (ref 0.350–4.500)

## 2020-08-30 MED ORDER — HEPARIN SOD (PORK) LOCK FLUSH 100 UNIT/ML IV SOLN
500.0000 [IU] | Freq: Once | INTRAVENOUS | Status: AC
  Administered 2020-08-30: 500 [IU] via INTRAVENOUS
  Filled 2020-08-30: qty 5

## 2020-08-30 MED ORDER — SODIUM CHLORIDE 0.9% FLUSH
10.0000 mL | INTRAVENOUS | Status: DC | PRN
  Administered 2020-08-30: 10 mL via INTRAVENOUS
  Filled 2020-08-30: qty 10

## 2020-08-30 NOTE — Progress Notes (Signed)
Hematology/Oncology follow up note [facility] Telephone:([phone]) [phone] [phone]   Patient Care Team: [name], NP as PCP - General Nice, [name], OD (Optometry) [name], MD as Consulting Physician (Hematology and Oncology)  REFERRING PROVIDER: Dr.Sninsky CHIEF COMPLAINTS/REASON FOR VISIT:  Follow up for bladder cancer, anemia.   HISTORY OF PRESENTING ILLNESS:  Angela Russell is a  70 y.o.  female with PMH listed below who was referred to me for evaluation of newly diagnosed bladder cancer. Patient initially presented to emergency room at the end of January 2020 for evaluation of dysuria, hematuria and the left lower quadrant inguinal pain and flank pain.  1/28 2020 CT renal stone study showed suspected irregular wall thickening about the superior bladder, not well assessed due to degree of bladder distention.  Recommend cystoscopy for further evaluation.  No renal stone or obstructive uropathy.  Patient was given IV Rocephin and referred patient for outpatient urology follow-up.  Urine culture was negative.  She again presented to ER after 2 days with similar symptoms.  Patient has 25-pack-year smoking history, quit approximately 20 years ago.  No family history of any urology malignancies 07/03/2018 another CT abdomen pelvis with contrast was done which showed no nephrolithiasis or hydronephrosis is identified.  Bladder is decompressed limiting evaluation.  07/16/2018.urology Dr. [name] - cystoscopy and bilateral retrograde pyelogram on 07/16/2018.  Pyelogram did not show any filling defect or abnormalities.  No hydronephrosis.  Ureteral orifice was not involved with tumor.  There is a large 5 cm posterior wall bladder tumor, bullous and sessile appearing.  Patient underwent TURBT.   Pathology: High-grade urothelial carcinoma, invasive into muscularis propria.  Lymphovascular invasion is present.  Carcinoma in situ is also identified.  Focal squamous  differentiation is noted, areas of invasive carcinoma display pleomorphic/sarcomatoid changes.  T2b  08/07/2018 CT without contrast negative.  2 subpleural right upper lobe nodule 2 to 3 mm likely benign. She also had baseline audiometry done.  08/04/2018 ddMVAC x 1 cycle, stopped due to intolerance and AKI.  Patient received 1 cycle of dd MVAC, not able to tolerate due to AKI. Patient then was referred to [facility] urology  09/22/2018 patient underwent a cystectomy, pathology pT3a N0 Mx.  11 lymph nodes were harvested and was all negative. Invasive urothelia carcinoma, high grade, with sarcomatoid features.   10/14/2018 patient was admitted due to pyelonephritis and a pelvic fluid collection.  Drain was placed. 10/23/2018 drain was removed. Patient has had difficulties getting to her appointments to [facility]. 02/09/2019, patient presented with abdominal pain. CT concerning for small bowel obstruction and increased size of right pelvic fluid collection concerning for cancer recurrence.  Right hydronephrosis to the level of pelvis and enlarged lymph nodes. Patient had JP drain placed with CT guidance to pelvic fluid collection and drained 400 cc amber fluid.  Culture was negative for growth of microorganisms and cytology was negative for malignancy.-JP drain was removed on the day of discharge. CT-guided core biopsy of pelvic lymph node adenopathy was attempted but not successful.  # Right lower extremity DVT, provoked by Transvenous biopsy  02/26/2019 transvenous biopsy by IR unsuccessful transvenous biopsy of the right pelvis mass. Post biopsy acute thrombus in the right external iliac and common femoral vein.  Patient was recommended to start anticoagulation with Xarelto however due to the co-pay, patient is not able to afford the medication. Anticoagulation regimen was switched to Eliquis 5 mg.  Patient reports that she has been taking it once a  day. 03/20/2019 PET showed FDG avid tissue in the  cystectomy bed, retroperitoneal and pelvic lymphadenopathy.  Right common iliac DVT.  # increased right lower extremity swelling.  She was started on Eliquis for anticoagulation by [facility].  We clarified with her pharmacy and she was actually taking Eliquis 2.5 mg twice daily. Right lower extremity swelling has not improved but instead worsened. 03/16/2019 She had ultrasound right lower extremity done which showed persistent extensive proximal right lower extremity DVT.  Anticoagulation regimen has increased to Eliquis 5 mg twice daily.  # establish care with [facility] oncology Dr. [name] for evaluation.  Dr. [name] recommended starting immunotherapy with PD-L1 inhibitor Pembrolizumab 200 mg every 3 weeks.  The sarcomatoid histology may not respond to chemotherapy well, could portend a better chance of response into immunotherapy PET scan after 4 cycles of Keytruda showed single mildly enlarged central mesenteric lymph node in the upper pelvis with SUV 9.6.  No other abnormal hypermetabolic activity was reported.  # #Right lower extremity  femoral vein DVT provoked by transvenous biopsy in the context of cancer recurrence, Status post IV C filter and mechanical thrombectomy.  IVC filter has been removed. She is currently off anticoagulation after she developed hematuria.  # 03/30/2019 Status post IVC filter placement, mechanical thrombectomy.-IVC filter was retrieved in January 2021. # PD-L1 CPS 100%.  # 03/31/2019 started on immunotherapy Keytruda.  # 04/26/2020 CT chest abdomen pelvis was reviewed and discussed with patient. No definitive finding of disease recurrence or metastasis.  Small amount of ascites similar to prior. There is a parastomal hernia containing a small margin of adjacent small bowel.  This is associated with wall thickening in approximately 7 cm segment of bowel adjacent to the hernia.  There is adjacent abnormal stranding of the mesentery and omentum.   INTERVAL HISTORY Angela Russell is a 70 y.o. female who has above history reviewed by me today presents for follow up visit for evaluation form metastatic high-grade urothelial carcinoma of the bladder. Sarcomatoid feature.  Patient has been on maintenance immunotherapy. Weight gain, 1 pound increase from last visit. Chronic abdominal discomfort due to hernia. 08/11/2020, patient was seen by Dr. Dolphus Jenny for parastomal hernia.  Patient has no obstructive symptoms and would like to continue to monitor her symptoms and hold off any intervention at this point.  Patient was recommended to wear ostomy hernia belt and was referred to [facility] wound ostomy nursing team.  Patient has hypertension and is on amlodipine and losartan.  She initially informed nurse that she is not taking losartan as pharmacy tells her she cannot fill medication until April. I reviewed medication list and looks like she has a refill of 90 tablets for losartan sent on 08/22/2020. The patient told me that she was not sure if she is taking the losartan or not.  Blood pressure in the clinic is 196/82. Patient denies any focal deficit, shortness of breath, chest pain, headache.  Review of Systems  Constitutional: Negative for appetite change, chills, fatigue and fever.  HENT:   Negative for hearing loss and voice change.   Eyes: Negative for eye problems.  Respiratory: Negative for chest tightness and cough.   Cardiovascular: Negative for chest pain and leg swelling.  Gastrointestinal: Negative for abdominal distention, abdominal pain and blood in stool.       Abdominal discomfort.   Endocrine: Negative for hot flashes.  Genitourinary: Negative for difficulty urinating and frequency.   Musculoskeletal: Negative for arthralgias.  Skin: Negative for itching and  rash.  Neurological: Positive for numbness. Negative for extremity weakness.  Hematological: Negative for adenopathy.  Psychiatric/Behavioral: Negative for confusion. The patient is not  nervous/anxious.     MEDICAL HISTORY:  Past Medical History:  Diagnosis Date  . Carotid artery plaque, right 01/2014  . CKD (chronic kidney disease)    stage 2-3  . Diabetes mellitus without complication ([place])   . DM (diabetes mellitus), type 2, uncontrolled ([place])   . Hyperlipidemia   . Hypertension   . Hypochromic microcytic anemia    mild  . Metastatic urothelial carcinoma ([place]) 03/23/2019  . Osteoporosis   . Renal insufficiency   . Rotator cuff tendonitis, right     SURGICAL HISTORY: Past Surgical History:  Procedure Laterality Date  . ABDOMINAL HYSTERECTOMY  2000   due to bleeding and fibroids, partial- still has ovaries  . CYSTOSCOPY W/ RETROGRADES Bilateral 07/16/2018   Procedure: CYSTOSCOPY WITH RETROGRADE PYELOGRAM;  Surgeon: Billey Co, MD;  Location: [facility] ORS;  Service: Urology;  Laterality: Bilateral;  . IVC FILTER INSERTION N/A 03/30/2019   Procedure: IVC FILTER INSERTION;  Surgeon: Algernon Huxley, MD;  Location: [place] CV LAB;  Service: Cardiovascular;  Laterality: N/A;  . IVC FILTER REMOVAL N/A 06/15/2019   Procedure: IVC FILTER REMOVAL;  Surgeon: Algernon Huxley, MD;  Location: [place] CV LAB;  Service: Cardiovascular;  Laterality: N/A;  . KNEE SURGERY Left 03/17/2013   torn meniscus  . PERIPHERAL VASCULAR THROMBECTOMY Right 03/30/2019   Procedure: PERIPHERAL VASCULAR THROMBECTOMY;  Surgeon: Algernon Huxley, MD;  Location: [place] CV LAB;  Service: Cardiovascular;  Laterality: Right;  . PORTA CATH INSERTION N/A 08/06/2018   Procedure: PORTA CATH INSERTION;  Surgeon: Algernon Huxley, MD;  Location: [place] CV LAB;  Service: Cardiovascular;  Laterality: N/A;  . TRANSURETHRAL RESECTION OF BLADDER TUMOR N/A 07/16/2018   Procedure: TRANSURETHRAL RESECTION OF BLADDER TUMOR (TURBT);  Surgeon: Billey Co, MD;  Location: [facility] ORS;  Service: Urology;  Laterality: N/A;    SOCIAL HISTORY: Social History   Socioeconomic History  . Marital status:  Single    Spouse name: Not on file  . Number of children: 1  . Years of education: Not on file  . Highest education level: 10th grade  Occupational History  . Not on file  Tobacco Use  . Smoking status: Former Smoker    Quit date: 06/05/1991    Years since quitting: 29.2  . Smokeless tobacco: Never Used  Vaping Use  . Vaping Use: Never used  Substance and Sexual Activity  . Alcohol use: No  . Drug use: No  . Sexual activity: Never  Other Topics Concern  . Not on file  Social History Narrative   Working full time   Social Determinants of Radio broadcast assistant Strain: Not on file  Food Insecurity: Not on file  Transportation Needs: Not on file  Physical Activity: Not on file  Stress: Not on file  Social Connections: Not on file  Intimate Partner Violence: Not on file    FAMILY HISTORY: Family History  Problem Relation Age of Onset  . Heart disease Mother   . Heart attack Mother   . Arthritis Father   . Diabetes Brother     ALLERGIES:  has No Known Allergies.  MEDICATIONS:  Current Outpatient Medications  Medication Sig Dispense Refill  . amLODipine (NORVASC) 5 MG tablet Take 1 tablet (5 mg total) by mouth daily. 90 tablet 1  . diclofenac sodium (VOLTAREN)  1 % GEL Apply 2 g topically 4 (four) times daily. (Patient taking differently: Apply 2 g topically as needed.) 100 g 2  . docusate sodium (COLACE) 100 MG capsule Take 1 capsule (100 mg total) by mouth daily. 90 capsule 0  . ferrous sulfate 325 (65 FE) MG EC tablet Take 325 mg by mouth daily with breakfast. OTC    . lidocaine-prilocaine (EMLA) cream Apply 1 application topically as needed. 30 g 3  . polyethylene glycol (MIRALAX / GLYCOLAX) 17 g packet Take 17 g by mouth daily as needed for mild constipation. 14 each 0  . rosuvastatin (CRESTOR) 40 MG tablet Take 1 tablet (40 mg total) by mouth daily. 90 tablet 1  . losartan (COZAAR) 25 MG tablet Take 1 tablet (25 mg total) by mouth daily. (Patient not taking:  Reported on 08/30/2020) 90 tablet 0   No current facility-administered medications for this visit.   Facility-Administered Medications Ordered in Other Visits  Medication Dose Route Frequency Provider Last Rate Last Admin  . sodium chloride flush (NS) 0.9 % injection 10 mL  10 mL Intravenous PRN [name], MD   10 mL at 08/30/20 0856     PHYSICAL EXAMINATION: ECOG PERFORMANCE STATUS: 1 - Symptomatic but completely ambulatory Vitals:   08/30/20 0909  BP: (!) 196/82  Pulse: 91  Resp: 16  Temp: (!) 97.2 F (36.2 C)   Filed Weights   08/30/20 0909  Weight: 210 lb 9.6 oz (95.5 kg)    Physical Exam Constitutional:      General: She is not in acute distress.    Comments: Walk independently  HENT:     Head: Normocephalic and atraumatic.  Eyes:     General: No scleral icterus.    Pupils: Pupils are equal, round, and reactive to light.  Cardiovascular:     Rate and Rhythm: Normal rate and regular rhythm.     Heart sounds: Normal heart sounds.  Pulmonary:     Effort: Pulmonary effort is normal. No respiratory distress.     Breath sounds: No wheezing.  Abdominal:     General: Bowel sounds are normal. There is no distension.     Palpations: Abdomen is soft. There is no mass.     Comments: Ureterostomy,   Musculoskeletal:        General: No deformity. Normal range of motion.     Cervical back: Normal range of motion and neck supple.  Skin:    General: Skin is warm and dry.     Coloration: Skin is not pale.     Findings: No erythema or rash.  Neurological:     Mental Status: She is alert and oriented to person, place, and time. Mental status is at baseline.     Cranial Nerves: No cranial nerve deficit.     Coordination: Coordination normal.  Psychiatric:        Mood and Affect: Mood normal.   .  RADIOGRAPHIC STUDIES: I have personally reviewed the radiological images as listed and agreed with the findings in the report.  CMP Latest Ref Rng & Units 08/30/2020  Glucose 70  - 99 mg/dL 192(H)  BUN 8 - 23 mg/dL 34(H)  Creatinine 0.44 - 1.00 mg/dL 1.63(H)  Sodium 135 - 145 mmol/L 138  Potassium 3.5 - 5.1 mmol/L 4.0  Chloride 98 - 111 mmol/L 106  CO2 22 - 32 mmol/L 20(L)  Calcium 8.9 - 10.3 mg/dL 9.0  Total Protein 6.5 - 8.1 g/dL 7.8  Total Bilirubin 0.3 -  1.2 mg/dL 0.5  Alkaline Phos 38 - 126 U/L 95  AST 15 - 41 U/L 23  ALT 0 - 44 U/L 14   CBC Latest Ref Rng & Units 08/30/2020  WBC 4.0 - 10.5 K/uL 4.9  Hemoglobin 12.0 - 15.0 g/dL 9.8(L)  Hematocrit 36.0 - 46.0 % 31.5(L)  Platelets 150 - 400 K/uL 202    LABORATORY DATA:  I have reviewed the data as listed Lab Results  Component Value Date   WBC 4.9 08/30/2020   HGB 9.8 (L) 08/30/2020   HCT 31.5 (L) 08/30/2020   MCV 77.0 (L) 08/30/2020   PLT 202 08/30/2020   Recent Labs    02/09/20 0832 02/16/20 1307 03/01/20 1301 03/22/20 0846 07/12/20 0917 08/02/20 1254 08/30/20 0843  NA 138 139 140   < > 140 141 138  K 4.4 4.6 4.6   < > 4.2 4.0 4.0  CL 104 107 108   < > 106 105 106  CO2 21* 20* 22   < > 23 23 20*  GLUCOSE 228* 163* 190*   < > 170* 237* 192*  BUN 41* 49* 33*   < > 29* 31* 34*  CREATININE 2.35* 2.41* 2.08*   < > 1.56* 1.59* 1.63*  CALCIUM 9.0 9.3 9.3   < > 8.9 9.0 9.0  GFRNONAA 20* 20* 24*   < > 36* 35* 34*  GFRAA 24* 23* 27*  --   --   --   --   PROT 7.9 8.6* 7.8   < > 7.5 7.5 7.8  ALBUMIN 4.3 4.5 4.3   < > 3.9 4.0 4.2  AST 27 22 23    < > 20 22 23   ALT 14 14 13    < > 11 10 14   ALKPHOS 100 86 87   < > 101 106 95  BILITOT 0.6 0.5 0.5   < > 0.4 0.7 0.5   < > = values in this interval not displayed.   Iron/TIBC/Ferritin/ %Sat    Component Value Date/Time   IRON 68 08/02/2020 1254   IRON 26 (L) 02/04/2019 1309   TIBC 312 08/02/2020 1254   TIBC 251 02/04/2019 1309   FERRITIN 197 08/02/2020 1254   FERRITIN 409 (H) 02/04/2019 1309   IRONPCTSAT 22 08/02/2020 1254   IRONPCTSAT 10 (L) 02/04/2019 1309    RADIOGRAPHIC STUDIES: I have personally reviewed the radiological images as  listed and agreed with the findings in the report. CT CHEST ABDOMEN PELVIS WO CONTRAST  Result Date: 08/19/2020 CLINICAL DATA:  Restaging metastatic bladder cancer. History of cystectomy and neobladder surgery in April 2020. EXAM: CT CHEST, ABDOMEN AND PELVIS WITHOUT CONTRAST TECHNIQUE: Multidetector CT imaging of the chest, abdomen and pelvis was performed following the standard protocol without IV contrast. COMPARISON:  Multiple priors including most recent CT chest abdomen pelvis January 06, 2020 and PET-CT September 01, 2019 FINDINGS: CT CHEST FINDINGS Cardiovascular: Normal size heart. No significant pericardial effusion/thickening. Coronary artery calcifications. Aortic atherosclerosis. No thoracic aortic aneurysm. Mediastinum/Nodes: Scattered prominent non pathologically enlarged mediastinal and hilar lymph nodes are stable. No discrete thyroid nodularity. Trachea esophagus appear within normal limits. Lungs/Pleura: Stable emphysematous change in scattered pulmonary scarring. No suspicious pulmonary nodules or masses. No pleural effusion. No pneumothorax. Stable pericardial cyst in the right inferior pulmonary vein. Musculoskeletal: No acute osseous abnormality. No aggressive lytic or blastic lesion of bone. CT ABDOMEN PELVIS FINDINGS Hepatobiliary: Unremarkable noncontrast appearance of the hepatic parenchyma. Gallbladder is unremarkable. No biliary ductal dilation. Pancreas: Unremarkable.  Spleen: Unremarkable. Adrenals/Urinary Tract: The adrenal glands are unremarkable. Next. No hydronephrosis. No nephrolithiasis. No contour deforming renal lesions. Stable postsurgical changes cystectomy with urinary diversion procedure with ileal conduit. Stomach/Bowel: The stomach, duodenum, small bowel and colon are grossly unremarkable. Normal appendix. Terminal ileum is unremarkable. No acute inflammatory changes, mass lesions or obstructive findings. Vascular/Lymphatic: Stable advanced atherosclerotic calcifications of  the aorta and branch vessels. Right iliac artery stent is again visualized. No pathologically enlarged mesenteric retroperitoneal or pelvic lymph nodes. A few prominent scattered mesenteric lymph nodes are again noted and appear stable in size. Reproductive: Surgically absent. Other: Small volume pelvic free fluid. Unchanged appearance of the nodularity along the left pelvic sidewall surgical clips. Musculoskeletal: No aggressive lytic or blastic lesion of bone. Moderate lower lumbar degenerative change. Degenerative change of the SI joints and bilateral hips. IMPRESSION: 1. Stable postsurgical changes cystectomy with urinary diversion procedure with ileal conduit. 2. No evidence of new or progressive metastatic disease within the chest, abdomen, or pelvis. 3. Stable advanced atherosclerotic calcifications involving the aorta and branch vessels. 4. Stable emphysematous change and pulmonary scarring. 5. Emphysema and aortic atherosclerosis. Aortic Atherosclerosis (ICD10-I70.0) and Emphysema (ICD10-J43.9). Electronically Signed   By: Dahlia Bailiff MD   On: 08/19/2020 12:03     ASSESSMENT & PLAN:  1. Metastatic urothelial carcinoma ([place])   2. Encounter for antineoplastic immunotherapy   3. Anemia in stage 3b chronic kidney disease ([place])   4. Weight gain   5. Malignant hypertension   Cancer Staging Bladder carcinoma [facility]) Staging form: Urinary Bladder, AJCC 8th Edition - Clinical stage from 08/01/2018: Stage IVA (cTX, cN3, cM1a) - Signed by [name], MD on 04/21/2019  #Metastatic urothelial carcinoma of bladder, sarcomatoid features pelvic sidewall mass biopsy showed metastatic carcinoma consistent with involvement by urothelial carcinoma. Labs are reviewed and discussed with patient. 08/19/2020, CT chest abdomen pelvis showed no evidence of new/progressive metastatic disease. NED Hold off treatment due to uncontrolled hypotension.  #Accelerated hypertension.  Patient is made aware that she has  increased risk of stroke, heart attack, aortic dissection etc.  I urged patient to discuss with primary care provider/pharmacy and get her medication.  I also recommend her to go to emergency room if she is not able to get her blood pressure medication refilled today.  #Hernia -parastomal hernia.   She has establish care with [facility] hernia center.  She was recommended to monitor her symptoms and aware ostomy belt  #Chronic kidney disease, stable creatinine.  Encourage oral hydration.  Avoid nephrotoxin.  #Chronic anemia secondary to stage III CKD.  Hemoglobin remained stable but low. Patient declines IV Venofer treatments we talked about option of IV Venofer treatment today again.    #History of diabetes.  Patient has persistent hypoglycemia.  A1c few months ago was 7.8. Glucose is 192. #Weight gain.  TSH has been monitored and has been normal.  Refer to nutritionist to discuss about diabetic diet.   #Follow-up in 1 week for evaluation prior to next [facility] treatment.All questions were answered. The patient knows to call the clinic with any problems questions or concerns.  [name], MD, PhD Hematology Oncology [facility] Pager- [phone] 08/30/20

## 2020-08-30 NOTE — Progress Notes (Signed)
Patient denies new problems/concerns today.   °

## 2020-09-01 ENCOUNTER — Inpatient Hospital Stay: Payer: Medicare Other

## 2020-09-01 NOTE — Progress Notes (Signed)
Nutrition  Patient did not show up for nutrition appointment today.   Sarya Linenberger B. Zenia Resides, [place], [place] Registered Dietitian [phone] (mobile)

## 2020-09-02 ENCOUNTER — Encounter: Payer: Self-pay | Admitting: Nurse Practitioner

## 2020-09-02 ENCOUNTER — Other Ambulatory Visit: Payer: Self-pay

## 2020-09-02 ENCOUNTER — Ambulatory Visit (INDEPENDENT_AMBULATORY_CARE_PROVIDER_SITE_OTHER): Payer: Medicare Other | Admitting: Nurse Practitioner

## 2020-09-02 VITALS — BP 169/81 | HR 99 | Temp 97.2°F | Wt 211.2 lb

## 2020-09-02 DIAGNOSIS — Z1211 Encounter for screening for malignant neoplasm of colon: Secondary | ICD-10-CM

## 2020-09-02 DIAGNOSIS — E118 Type 2 diabetes mellitus with unspecified complications: Secondary | ICD-10-CM | POA: Diagnosis not present

## 2020-09-02 DIAGNOSIS — I152 Hypertension secondary to endocrine disorders: Secondary | ICD-10-CM

## 2020-09-02 DIAGNOSIS — Z1231 Encounter for screening mammogram for malignant neoplasm of breast: Secondary | ICD-10-CM

## 2020-09-02 DIAGNOSIS — E1159 Type 2 diabetes mellitus with other circulatory complications: Secondary | ICD-10-CM | POA: Diagnosis not present

## 2020-09-02 DIAGNOSIS — Z13820 Encounter for screening for osteoporosis: Secondary | ICD-10-CM | POA: Diagnosis not present

## 2020-09-02 NOTE — Assessment & Plan Note (Signed)
Chronic.  Uncontrolled.  Patient has been out of medication for 3 weeks.  Just received refills and restarted them.  Checks blood pressures at home. Restart all medications.  Return in 1 month for reevaluation.  Will draw labs at next visit.

## 2020-09-02 NOTE — Progress Notes (Signed)
BP (!) 169/81   Pulse 99   Temp (!) 97.2 F (36.2 C)   Wt 211 lb 4 oz (95.8 kg)   SpO2 98%   BMI 37.42 kg/m    Subjective:    Patient ID: Angela Russell, female    DOB: 1950-10-20, 70 y.o.   MRN: [id number]  HPI: Angela Russell is a 70 y.o. female  No chief complaint on file.  HYPERTENSION / HYPERLIPIDEMIA Patient lost her medications and has been out of them for about 3 weeks. She is checking her blood pressures at home and they are running high.    DIABETES Has not been checking blood sugars because her meter is not working.  Patient has been off of diabetes medication for about a year. Would like to stay off medication.  Denies polyuria/polydipsia.   Relevant past medical, surgical, family and social history reviewed and updated as indicated. Interim medical history since our last visit reviewed. Allergies and medications reviewed and updated.  Review of Systems  Eyes: Negative for visual disturbance.  Respiratory: Negative for cough, chest tightness and shortness of breath.   Cardiovascular: Negative for chest pain, palpitations and leg swelling.  Endocrine: Negative for polydipsia and polyuria.  Neurological: Negative for dizziness and headaches.    Per HPI unless specifically indicated above     Objective:    BP (!) 169/81   Pulse 99   Temp (!) 97.2 F (36.2 C)   Wt 211 lb 4 oz (95.8 kg)   SpO2 98%   BMI 37.42 kg/m   Wt Readings from Last 3 Encounters:  09/02/20 211 lb 4 oz (95.8 kg)  08/30/20 210 lb 9.6 oz (95.5 kg)  08/02/20 209 lb 14.4 oz (95.2 kg)    Physical Exam Vitals and nursing note reviewed.  Constitutional:      General: She is not in acute distress.    Appearance: Normal appearance. She is normal weight. She is not ill-appearing, toxic-appearing or diaphoretic.  HENT:     Head: Normocephalic.     Right Ear: External ear normal.     Left Ear: External ear normal.     Nose: Nose normal.     Mouth/Throat:     Mouth: Mucous  membranes are moist.     Pharynx: Oropharynx is clear.  Eyes:     General:        Right eye: No discharge.        Left eye: No discharge.     Extraocular Movements: Extraocular movements intact.     Conjunctiva/sclera: Conjunctivae normal.     Pupils: Pupils are equal, round, and reactive to light.  Cardiovascular:     Rate and Rhythm: Normal rate and regular rhythm.     Heart sounds: No murmur heard.   Pulmonary:     Effort: Pulmonary effort is normal. No respiratory distress.     Breath sounds: Normal breath sounds. No wheezing or rales.  Musculoskeletal:     Cervical back: Normal range of motion and neck supple.  Skin:    General: Skin is warm and dry.     Capillary Refill: Capillary refill takes less than 2 seconds.  Neurological:     General: No focal deficit present.     Mental Status: She is alert and oriented to person, place, and time. Mental status is at baseline.  Psychiatric:        Mood and Affect: Mood normal.        Behavior: Behavior normal.  Thought Content: Thought content normal.        Judgment: Judgment normal.     Results for orders placed or performed in visit on 08/30/20  TSH  Result Value Ref Range   TSH 2.028 0.350 - 4.500 uIU/mL  Comprehensive metabolic panel  Result Value Ref Range   Sodium 138 135 - 145 mmol/L   Potassium 4.0 3.5 - 5.1 mmol/L   Chloride 106 98 - 111 mmol/L   CO2 20 (L) 22 - 32 mmol/L   Glucose, Bld 192 (H) 70 - 99 mg/dL   BUN 34 (H) 8 - 23 mg/dL   Creatinine, Ser 1.63 (H) 0.44 - 1.00 mg/dL   Calcium 9.0 8.9 - 10.3 mg/dL   Total Protein 7.8 6.5 - 8.1 g/dL   Albumin 4.2 3.5 - 5.0 g/dL   AST 23 15 - 41 U/L   ALT 14 0 - 44 U/L   Alkaline Phosphatase 95 38 - 126 U/L   Total Bilirubin 0.5 0.3 - 1.2 mg/dL   GFR, Estimated 34 (L) >60 mL/min   Anion gap 12 5 - 15  CBC with Differential/Platelet  Result Value Ref Range   WBC 4.9 4.0 - 10.5 K/uL   RBC 4.09 3.87 - 5.11 MIL/uL   Hemoglobin 9.8 (L) 12.0 - 15.0 g/dL    HCT 31.5 (L) 36.0 - 46.0 %   MCV 77.0 (L) 80.0 - 100.0 fL   MCH 24.0 (L) 26.0 - 34.0 pg   MCHC 31.1 30.0 - 36.0 g/dL   RDW 14.2 11.5 - 15.5 %   Platelets 202 150 - 400 K/uL   nRBC 0.0 0.0 - 0.2 %   Neutrophils Relative % 62 %   Neutro Abs 3.1 1.7 - 7.7 K/uL   Lymphocytes Relative 25 %   Lymphs Abs 1.2 0.7 - 4.0 K/uL   Monocytes Relative 10 %   Monocytes Absolute 0.5 0.1 - 1.0 K/uL   Eosinophils Relative 2 %   Eosinophils Absolute 0.1 0.0 - 0.5 K/uL   Basophils Relative 1 %   Basophils Absolute 0.0 0.0 - 0.1 K/uL   Immature Granulocytes 0 %   Abs Immature Granulocytes 0.01 0.00 - 0.07 K/uL      Assessment & Plan:   Problem List Items Addressed This Visit      Cardiovascular and Mediastinum   Hypertension associated with diabetes ([place]) - Primary    Chronic.  Uncontrolled.  Patient has been out of medication for 3 weeks.  Just received refills and restarted them.  Checks blood pressures at home. Restart all medications.  Return in 1 month for reevaluation.  Will draw labs at next visit.         Endocrine   Controlled diabetes mellitus type 2 with complications ([facility])    Chronic.  Discussed that a1c had increased to 8.4.  Need to monitor diet and add exercise to avoid going back on medication.  Discussed once weekly injectable which patient does not want to do.  Will recheck A1c at next visit.        Other Visit Diagnoses    Encounter for screening mammogram for malignant neoplasm of breast       Relevant Orders   MM DIGITAL SCREENING BILATERAL   Encounter for screening for osteoporosis       Screening for colon cancer       Relevant Orders   Ambulatory referral to Gastroenterology       Follow up plan: Return in about 1 month (  around 10/02/2020) for BP Check.  A total of 20 minutes were spent on this encounter today.  When total time is documented, this includes both the face-to-face and non-face-to-face time personally spent before, during and after the visit on the date  of the encounter.

## 2020-09-02 NOTE — Assessment & Plan Note (Signed)
Chronic.  Discussed that a1c had increased to 8.4.  Need to monitor diet and add exercise to avoid going back on medication.  Discussed once weekly injectable which patient does not want to do.  Will recheck A1c at next visit.

## 2020-09-05 ENCOUNTER — Other Ambulatory Visit: Payer: Self-pay

## 2020-09-05 DIAGNOSIS — C7910 Secondary malignant neoplasm of unspecified urinary organs: Secondary | ICD-10-CM

## 2020-09-06 ENCOUNTER — Inpatient Hospital Stay (HOSPITAL_BASED_OUTPATIENT_CLINIC_OR_DEPARTMENT_OTHER): Payer: Medicare Other | Admitting: Oncology

## 2020-09-06 ENCOUNTER — Encounter: Payer: Self-pay | Admitting: Oncology

## 2020-09-06 ENCOUNTER — Inpatient Hospital Stay: Payer: Medicare Other | Attending: Oncology

## 2020-09-06 ENCOUNTER — Telehealth: Payer: Self-pay | Admitting: Nurse Practitioner

## 2020-09-06 ENCOUNTER — Inpatient Hospital Stay: Payer: Medicare Other

## 2020-09-06 VITALS — HR 94

## 2020-09-06 VITALS — BP 144/85 | HR 102 | Temp 98.2°F | Resp 16 | Wt 210.0 lb

## 2020-09-06 DIAGNOSIS — K435 Parastomal hernia without obstruction or  gangrene: Secondary | ICD-10-CM

## 2020-09-06 DIAGNOSIS — N1330 Unspecified hydronephrosis: Secondary | ICD-10-CM | POA: Diagnosis not present

## 2020-09-06 DIAGNOSIS — C7910 Secondary malignant neoplasm of unspecified urinary organs: Secondary | ICD-10-CM | POA: Diagnosis not present

## 2020-09-06 DIAGNOSIS — Z5112 Encounter for antineoplastic immunotherapy: Secondary | ICD-10-CM | POA: Diagnosis not present

## 2020-09-06 DIAGNOSIS — R739 Hyperglycemia, unspecified: Secondary | ICD-10-CM | POA: Diagnosis not present

## 2020-09-06 DIAGNOSIS — E1165 Type 2 diabetes mellitus with hyperglycemia: Secondary | ICD-10-CM | POA: Diagnosis not present

## 2020-09-06 DIAGNOSIS — I129 Hypertensive chronic kidney disease with stage 1 through stage 4 chronic kidney disease, or unspecified chronic kidney disease: Secondary | ICD-10-CM | POA: Diagnosis not present

## 2020-09-06 DIAGNOSIS — D631 Anemia in chronic kidney disease: Secondary | ICD-10-CM | POA: Insufficient documentation

## 2020-09-06 DIAGNOSIS — M810 Age-related osteoporosis without current pathological fracture: Secondary | ICD-10-CM | POA: Insufficient documentation

## 2020-09-06 DIAGNOSIS — D649 Anemia, unspecified: Secondary | ICD-10-CM | POA: Insufficient documentation

## 2020-09-06 DIAGNOSIS — R635 Abnormal weight gain: Secondary | ICD-10-CM

## 2020-09-06 DIAGNOSIS — Z86718 Personal history of other venous thrombosis and embolism: Secondary | ICD-10-CM | POA: Insufficient documentation

## 2020-09-06 DIAGNOSIS — N1832 Chronic kidney disease, stage 3b: Secondary | ICD-10-CM | POA: Insufficient documentation

## 2020-09-06 DIAGNOSIS — J439 Emphysema, unspecified: Secondary | ICD-10-CM | POA: Insufficient documentation

## 2020-09-06 DIAGNOSIS — Z79899 Other long term (current) drug therapy: Secondary | ICD-10-CM | POA: Diagnosis not present

## 2020-09-06 DIAGNOSIS — I700 Atherosclerosis of aorta: Secondary | ICD-10-CM | POA: Insufficient documentation

## 2020-09-06 DIAGNOSIS — C679 Malignant neoplasm of bladder, unspecified: Secondary | ICD-10-CM | POA: Diagnosis not present

## 2020-09-06 DIAGNOSIS — Z87891 Personal history of nicotine dependence: Secondary | ICD-10-CM | POA: Insufficient documentation

## 2020-09-06 DIAGNOSIS — E1122 Type 2 diabetes mellitus with diabetic chronic kidney disease: Secondary | ICD-10-CM | POA: Insufficient documentation

## 2020-09-06 DIAGNOSIS — E785 Hyperlipidemia, unspecified: Secondary | ICD-10-CM | POA: Insufficient documentation

## 2020-09-06 LAB — CBC WITH DIFFERENTIAL/PLATELET
Abs Immature Granulocytes: 0.02 10*3/uL (ref 0.00–0.07)
Basophils Absolute: 0 10*3/uL (ref 0.0–0.1)
Basophils Relative: 1 %
Eosinophils Absolute: 0.2 10*3/uL (ref 0.0–0.5)
Eosinophils Relative: 3 %
HCT: 32.8 % — ABNORMAL LOW (ref 36.0–46.0)
Hemoglobin: 10.1 g/dL — ABNORMAL LOW (ref 12.0–15.0)
Immature Granulocytes: 0 %
Lymphocytes Relative: 18 %
Lymphs Abs: 1 10*3/uL (ref 0.7–4.0)
MCH: 23.9 pg — ABNORMAL LOW (ref 26.0–34.0)
MCHC: 30.8 g/dL (ref 30.0–36.0)
MCV: 77.5 fL — ABNORMAL LOW (ref 80.0–100.0)
Monocytes Absolute: 0.5 10*3/uL (ref 0.1–1.0)
Monocytes Relative: 10 %
Neutro Abs: 3.7 10*3/uL (ref 1.7–7.7)
Neutrophils Relative %: 68 %
Platelets: 203 10*3/uL (ref 150–400)
RBC: 4.23 MIL/uL (ref 3.87–5.11)
RDW: 14.2 % (ref 11.5–15.5)
WBC: 5.4 10*3/uL (ref 4.0–10.5)
nRBC: 0 % (ref 0.0–0.2)

## 2020-09-06 LAB — COMPREHENSIVE METABOLIC PANEL
ALT: 13 U/L (ref 0–44)
AST: 26 U/L (ref 15–41)
Albumin: 4.1 g/dL (ref 3.5–5.0)
Alkaline Phosphatase: 105 U/L (ref 38–126)
Anion gap: 12 (ref 5–15)
BUN: 32 mg/dL — ABNORMAL HIGH (ref 8–23)
CO2: 21 mmol/L — ABNORMAL LOW (ref 22–32)
Calcium: 9 mg/dL (ref 8.9–10.3)
Chloride: 104 mmol/L (ref 98–111)
Creatinine, Ser: 1.83 mg/dL — ABNORMAL HIGH (ref 0.44–1.00)
GFR, Estimated: 29 mL/min — ABNORMAL LOW (ref >60)
Glucose, Bld: 211 mg/dL — ABNORMAL HIGH (ref 70–99)
Potassium: 4.3 mmol/L (ref 3.5–5.1)
Sodium: 137 mmol/L (ref 135–145)
Total Bilirubin: 0.5 mg/dL (ref 0.3–1.2)
Total Protein: 7.7 g/dL (ref 6.5–8.1)

## 2020-09-06 MED ORDER — HEPARIN SOD (PORK) LOCK FLUSH 100 UNIT/ML IV SOLN
500.0000 [IU] | Freq: Once | INTRAVENOUS | Status: AC
  Administered 2020-09-06: 500 [IU] via INTRAVENOUS
  Filled 2020-09-06: qty 5

## 2020-09-06 MED ORDER — SODIUM CHLORIDE 0.9% FLUSH
10.0000 mL | Freq: Once | INTRAVENOUS | Status: AC
  Administered 2020-09-06: 10 mL via INTRAVENOUS
  Filled 2020-09-06: qty 10

## 2020-09-06 MED ORDER — SODIUM CHLORIDE 0.9 % IV SOLN
Freq: Once | INTRAVENOUS | Status: AC
  Filled 2020-09-06: qty 250

## 2020-09-06 MED ORDER — HEPARIN SOD (PORK) LOCK FLUSH 100 UNIT/ML IV SOLN
INTRAVENOUS | Status: AC
  Filled 2020-09-06: qty 5

## 2020-09-06 MED ORDER — SODIUM CHLORIDE 0.9 % IV SOLN
200.0000 mg | Freq: Once | INTRAVENOUS | Status: AC
  Administered 2020-09-06: 200 mg via INTRAVENOUS
  Filled 2020-09-06: qty 8

## 2020-09-06 NOTE — Progress Notes (Signed)
Hematology/Oncology follow up note [facility] Telephone:(336) [phone] Fax:([phone]) [phone]   Patient Care Team: [name], NP as PCP - General Nice, [name], OD (Optometry) [name], MD as Consulting Physician (Hematology and Oncology)  REFERRING PROVIDER: Dr.Sninsky CHIEF COMPLAINTS/REASON FOR VISIT:  Follow up for bladder cancer, anemia.   HISTORY OF PRESENTING ILLNESS:  Angela Russell is a  70 y.o.  female with PMH listed below who was referred to me for evaluation of newly diagnosed bladder cancer. Patient initially presented to emergency room at the end of January 2020 for evaluation of dysuria, hematuria and the left lower quadrant inguinal pain and flank pain.  1/28 2020 CT renal stone study showed suspected irregular wall thickening about the superior bladder, not well assessed due to degree of bladder distention.  Recommend cystoscopy for further evaluation.  No renal stone or obstructive uropathy.  Patient was given IV Rocephin and referred patient for outpatient urology follow-up.  Urine culture was negative.  She again presented to ER after 2 days with similar symptoms.  Patient has 25-pack-year smoking history, quit approximately 20 years ago.  No family history of any urology malignancies 07/03/2018 another CT abdomen pelvis with contrast was done which showed no nephrolithiasis or hydronephrosis is identified.  Bladder is decompressed limiting evaluation.  07/16/2018.urology Dr. [name] - cystoscopy and bilateral retrograde pyelogram on 07/16/2018.  Pyelogram did not show any filling defect or abnormalities.  No hydronephrosis.  Ureteral orifice was not involved with tumor.  There is a large 5 cm posterior wall bladder tumor, bullous and sessile appearing.  Patient underwent TURBT.   Pathology: High-grade urothelial carcinoma, invasive into muscularis propria.  Lymphovascular invasion is present.  Carcinoma in situ is also identified.  Focal squamous  differentiation is noted, areas of invasive carcinoma display pleomorphic/sarcomatoid changes.  T2b  08/07/2018 CT without contrast negative.  2 subpleural right upper lobe nodule 2 to 3 mm likely benign. She also had baseline audiometry done.  08/04/2018 ddMVAC x 1 cycle, stopped due to intolerance and AKI.  Patient received 1 cycle of dd MVAC, not able to tolerate due to AKI. Patient then was referred to [facility] urology  09/22/2018 patient underwent a cystectomy, pathology pT3a N0 Mx.  11 lymph nodes were harvested and was all negative. Invasive urothelia carcinoma, high grade, with sarcomatoid features.   10/14/2018 patient was admitted due to pyelonephritis and a pelvic fluid collection.  Drain was placed. 10/23/2018 drain was removed. Patient has had difficulties getting to her appointments to [facility]. 02/09/2019, patient presented with abdominal pain. CT concerning for small bowel obstruction and increased size of right pelvic fluid collection concerning for cancer recurrence.  Right hydronephrosis to the level of pelvis and enlarged lymph nodes. Patient had JP drain placed with CT guidance to pelvic fluid collection and drained 400 cc amber fluid.  Culture was negative for growth of microorganisms and cytology was negative for malignancy.-JP drain was removed on the day of discharge. CT-guided core biopsy of pelvic lymph node adenopathy was attempted but not successful.  # Right lower extremity DVT, provoked by Transvenous biopsy  02/26/2019 transvenous biopsy by IR unsuccessful transvenous biopsy of the right pelvis mass. Post biopsy acute thrombus in the right external iliac and common femoral vein.  Patient was recommended to start anticoagulation with Xarelto however due to the co-pay, patient is not able to afford the medication. Anticoagulation regimen was switched to Eliquis 5 mg.  Patient reports that she has been taking it once a  day. 03/20/2019 PET showed FDG avid tissue in the  cystectomy bed, retroperitoneal and pelvic lymphadenopathy.  Right common iliac DVT.  # increased right lower extremity swelling.  She was started on Eliquis for anticoagulation by [facility].  We clarified with her pharmacy and she was actually taking Eliquis 2.5 mg twice daily. Right lower extremity swelling has not improved but instead worsened. 03/16/2019 She had ultrasound right lower extremity done which showed persistent extensive proximal right lower extremity DVT.  Anticoagulation regimen has increased to Eliquis 5 mg twice daily.  # establish care with [facility] oncology Dr. [name] for evaluation.  Dr. [name] recommended starting immunotherapy with PD-L1 inhibitor Pembrolizumab 200 mg every 3 weeks.  The sarcomatoid histology may not respond to chemotherapy well, could portend a better chance of response into immunotherapy PET scan after 4 cycles of Keytruda showed single mildly enlarged central mesenteric lymph node in the upper pelvis with SUV 9.6.  No other abnormal hypermetabolic activity was reported.  # #Right lower extremity  femoral vein DVT provoked by transvenous biopsy in the context of cancer recurrence, Status post IV C filter and mechanical thrombectomy.  IVC filter has been removed. She is currently off anticoagulation after she developed hematuria.  # 03/30/2019 Status post IVC filter placement, mechanical thrombectomy.-IVC filter was retrieved in January 2021. # PD-L1 CPS 100%.  # 03/31/2019 started on immunotherapy Keytruda.  # 04/26/2020 CT chest abdomen pelvis was reviewed and discussed with patient. No definitive finding of disease recurrence or metastasis.  Small amount of ascites similar to prior. There is a parastomal hernia containing a small margin of adjacent small bowel.  This is associated with wall thickening in approximately 7 cm segment of bowel adjacent to the hernia.  There is adjacent abnormal stranding of the mesentery and omentum.  # 08/19/2020, CT chest  abdomen pelvis showed no evidence of new/progressive metastatic disease. NED Hold off treatment due to uncontrolled hypotension.  # Chronic abdominal discomfort due to hernia. 08/11/2020, patient was seen by Dr. Dolphus Jenny for parastomal hernia.  Patient has no obstructive symptoms and would like to continue to monitor her symptoms and hold off any intervention at this point.  Patient was recommended to wear ostomy hernia belt and was referred to [facility] wound ostomy nursing team.   Angela Russell is a 70 y.o. female who has above history reviewed by me today presents for follow up visit for evaluation form metastatic high-grade urothelial carcinoma of the bladder. Sarcomatoid feature.  Patient has been on maintenance immunotherapy. Overall she tolerates immunotherapy. She was referred to establish care with dietitian and no showed She was seen by primary care provider recently and was recommended to continue diet control and repeat A1c at next visit in May. During last visit, blood pressure was extremely high and she was not taking her pain medication.  Today blood pressure has improved.   Review of Systems  Constitutional: Negative for appetite change, chills, fatigue and fever.  HENT:   Negative for hearing loss and voice change.   Eyes: Negative for eye problems.  Respiratory: Negative for chest tightness and cough.   Cardiovascular: Negative for chest pain and leg swelling.  Gastrointestinal: Negative for abdominal distention, abdominal pain and blood in stool.       Abdominal discomfort.   Endocrine: Negative for hot flashes.  Genitourinary: Negative for difficulty urinating and frequency.   Musculoskeletal: Negative for arthralgias.  Skin: Negative for itching and rash.  Neurological: Positive for numbness. Negative for extremity  weakness.  Hematological: Negative for adenopathy.  Psychiatric/Behavioral: Negative for confusion. The patient is not nervous/anxious.      MEDICAL HISTORY:  Past Medical History:  Diagnosis Date  . Carotid artery plaque, right 01/2014  . CKD (chronic kidney disease)    stage 2-3  . Diabetes mellitus without complication ([place])   . DM (diabetes mellitus), type 2, uncontrolled ([place])   . Hyperlipidemia   . Hypertension   . Hypochromic microcytic anemia    mild  . Metastatic urothelial carcinoma ([place]) 03/23/2019  . Osteoporosis   . Renal insufficiency   . Rotator cuff tendonitis, right     SURGICAL HISTORY: Past Surgical History:  Procedure Laterality Date  . ABDOMINAL HYSTERECTOMY  2000   due to bleeding and fibroids, partial- still has ovaries  . CYSTOSCOPY W/ RETROGRADES Bilateral 07/16/2018   Procedure: CYSTOSCOPY WITH RETROGRADE PYELOGRAM;  Surgeon: Billey Co, MD;  Location: [facility] ORS;  Service: Urology;  Laterality: Bilateral;  . IVC FILTER INSERTION N/A 03/30/2019   Procedure: IVC FILTER INSERTION;  Surgeon: Algernon Huxley, MD;  Location: [place] [facility];  Service: Cardiovascular;  Laterality: N/A;  . IVC FILTER REMOVAL N/A 06/15/2019   Procedure: IVC FILTER REMOVAL;  Surgeon: Algernon Huxley, MD;  Location: [place] [facility];  Service: Cardiovascular;  Laterality: N/A;  . KNEE SURGERY Left 03/17/2013   torn meniscus  . PERIPHERAL VASCULAR THROMBECTOMY Right 03/30/2019   Procedure: PERIPHERAL VASCULAR THROMBECTOMY;  Surgeon: Algernon Huxley, MD;  Location: [place] [facility];  Service: Cardiovascular;  Laterality: Right;  . PORTA CATH INSERTION N/A 08/06/2018   Procedure: PORTA CATH INSERTION;  Surgeon: Algernon Huxley, MD;  Location: [place] [facility];  Service: Cardiovascular;  Laterality: N/A;  . TRANSURETHRAL RESECTION OF BLADDER TUMOR N/A 07/16/2018   Procedure: TRANSURETHRAL RESECTION OF BLADDER TUMOR (TURBT);  Surgeon: Billey Co, MD;  Location: [facility] ORS;  Service: Urology;  Laterality: N/A;    SOCIAL HISTORY: Social History   Socioeconomic History  . Marital status: Single    Spouse  name: Not on file  . Number of children: 1  . Years of education: Not on file  . Highest education level: 10th grade  Occupational History  . Not on file  Tobacco Use  . Smoking status: Former Smoker    Quit date: 06/05/1991    Years since quitting: 29.2  . Smokeless tobacco: Never Used  Vaping Use  . Vaping Use: Never used  Substance and Sexual Activity  . Alcohol use: No  . Drug use: No  . Sexual activity: Never  Other Topics Concern  . Not on file  Social History Narrative   Working full time   Social Determinants of Radio broadcast assistant Strain: Not on file  Food Insecurity: Not on file  Transportation Needs: Not on file  Physical Activity: Not on file  Stress: Not on file  Social Connections: Not on file  Intimate Partner Violence: Not on file    FAMILY HISTORY: Family History  Problem Relation Age of Onset  . Heart disease Mother   . Heart attack Mother   . Arthritis Father   . Diabetes Brother     ALLERGIES:  has No Known Allergies.  MEDICATIONS:  Current Outpatient Medications  Medication Sig Dispense Refill  . amLODipine (NORVASC) 5 MG tablet Take 1 tablet (5 mg total) by mouth daily. 90 tablet 1  . diclofenac sodium (VOLTAREN) 1 % GEL Apply 2 g topically 4 (four)  times daily. (Patient taking differently: Apply 2 g topically as needed.) 100 g 2  . docusate sodium (COLACE) 100 MG capsule Take 1 capsule (100 mg total) by mouth daily. 90 capsule 0  . ferrous sulfate 325 (65 FE) MG EC tablet Take 325 mg by mouth daily with breakfast. OTC    . lidocaine-prilocaine (EMLA) cream Apply 1 application topically as needed. 30 g 3  . losartan (COZAAR) 25 MG tablet Take 1 tablet (25 mg total) by mouth daily. (Patient not taking: Reported on 08/30/2020) 90 tablet 0  . polyethylene glycol (MIRALAX / GLYCOLAX) 17 g packet Take 17 g by mouth daily as needed for mild constipation. 14 each 0  . rosuvastatin (CRESTOR) 40 MG tablet Take 1 tablet (40 mg total) by mouth  daily. 90 tablet 1   No current facility-administered medications for this visit.   Facility-Administered Medications Ordered in Other Visits  Medication Dose Route Frequency Provider Last Rate Last Admin  . heparin lock flush 100 unit/mL  500 Units Intravenous Once [name], MD         PHYSICAL EXAMINATION: ECOG PERFORMANCE STATUS: 1 - Symptomatic but completely ambulatory Vitals:   09/06/20 0841  BP: (!) 144/85  Pulse: (!) 102  Resp: 16  Temp: 98.2 F (36.8 C)   Filed Weights   09/06/20 0841  Weight: 210 lb (95.3 kg)    Physical Exam Constitutional:      General: She is not in acute distress.    Comments: Walk independently  HENT:     Head: Normocephalic and atraumatic.  Eyes:     General: No scleral icterus.    Pupils: Pupils are equal, round, and reactive to light.  Cardiovascular:     Rate and Rhythm: Normal rate and regular rhythm.     Heart sounds: Normal heart sounds.  Pulmonary:     Effort: Pulmonary effort is normal. No respiratory distress.     Breath sounds: No wheezing.  Abdominal:     General: Bowel sounds are normal. There is no distension.     Palpations: Abdomen is soft. There is no mass.     Comments: Ureterostomy,   Musculoskeletal:        General: No deformity. Normal range of motion.     Cervical back: Normal range of motion and neck supple.  Skin:    General: Skin is warm and dry.     Coloration: Skin is not pale.     Findings: No erythema or rash.  Neurological:     Mental Status: She is alert and oriented to person, place, and time. Mental status is at baseline.     Cranial Nerves: No cranial nerve deficit.     Coordination: Coordination normal.  Psychiatric:        Mood and Affect: Mood normal.   .  RADIOGRAPHIC STUDIES: I have personally reviewed the radiological images as listed and agreed with the findings in the report.  CMP Latest Ref Rng & Units 08/30/2020  Glucose 70 - 99 mg/dL 192(H)  BUN 8 - 23 mg/dL 34(H)  Creatinine  0.44 - 1.00 mg/dL 1.63(H)  Sodium 135 - 145 mmol/L 138  Potassium 3.5 - 5.1 mmol/L 4.0  Chloride 98 - 111 mmol/L 106  CO2 22 - 32 mmol/L 20(L)  Calcium 8.9 - 10.3 mg/dL 9.0  Total Protein 6.5 - 8.1 g/dL 7.8  Total Bilirubin 0.3 - 1.2 mg/dL 0.5  Alkaline Phos 38 - 126 U/L 95  AST 15 - 41 U/L  23  ALT 0 - 44 U/L 14   CBC Latest Ref Rng & Units 08/30/2020  WBC 4.0 - 10.5 K/uL 4.9  Hemoglobin 12.0 - 15.0 g/dL 9.8(L)  Hematocrit 36.0 - 46.0 % 31.5(L)  Platelets 150 - 400 K/uL 202    LABORATORY DATA:  I have reviewed the data as listed Lab Results  Component Value Date   WBC 4.9 08/30/2020   HGB 9.8 (L) 08/30/2020   HCT 31.5 (L) 08/30/2020   MCV 77.0 (L) 08/30/2020   PLT 202 08/30/2020   Recent Labs    02/09/20 0832 02/16/20 1307 03/01/20 1301 03/22/20 0846 07/12/20 0917 08/02/20 1254 08/30/20 0843  NA 138 139 140   < > 140 141 138  K 4.4 4.6 4.6   < > 4.2 4.0 4.0  CL 104 107 108   < > 106 105 106  CO2 21* 20* 22   < > 23 23 20*  GLUCOSE 228* 163* 190*   < > 170* 237* 192*  BUN 41* 49* 33*   < > 29* 31* 34*  CREATININE 2.35* 2.41* 2.08*   < > 1.56* 1.59* 1.63*  CALCIUM 9.0 9.3 9.3   < > 8.9 9.0 9.0  GFRNONAA 20* 20* 24*   < > 36* 35* 34*  GFRAA 24* 23* 27*  --   --   --   --   PROT 7.9 8.6* 7.8   < > 7.5 7.5 7.8  ALBUMIN 4.3 4.5 4.3   < > 3.9 4.0 4.2  AST 27 22 23    < > 20 22 23   ALT 14 14 13    < > 11 10 14   ALKPHOS 100 86 87   < > 101 106 95  BILITOT 0.6 0.5 0.5   < > 0.4 0.7 0.5   < > = values in this interval not displayed.   Iron/TIBC/Ferritin/ %Sat    Component Value Date/Time   IRON 68 08/02/2020 1254   IRON 26 (L) 02/04/2019 1309   TIBC 312 08/02/2020 1254   TIBC 251 02/04/2019 1309   FERRITIN 197 08/02/2020 1254   FERRITIN 409 (H) 02/04/2019 1309   IRONPCTSAT 22 08/02/2020 1254   IRONPCTSAT 10 (L) 02/04/2019 1309    RADIOGRAPHIC STUDIES: I have personally reviewed the radiological images as listed and agreed with the findings in the report. CT  CHEST ABDOMEN PELVIS WO CONTRAST  Result Date: 08/19/2020 CLINICAL DATA:  Restaging metastatic bladder cancer. History of cystectomy and neobladder surgery in April 2020. EXAM: CT CHEST, ABDOMEN AND PELVIS WITHOUT CONTRAST TECHNIQUE: Multidetector CT imaging of the chest, abdomen and pelvis was performed following the standard protocol without IV contrast. COMPARISON:  Multiple priors including most recent CT chest abdomen pelvis January 06, 2020 and PET-CT September 01, 2019 FINDINGS: CT CHEST FINDINGS Cardiovascular: Normal size heart. No significant pericardial effusion/thickening. Coronary artery calcifications. Aortic atherosclerosis. No thoracic aortic aneurysm. Mediastinum/Nodes: Scattered prominent non pathologically enlarged mediastinal and hilar lymph nodes are stable. No discrete thyroid nodularity. Trachea esophagus appear within normal limits. Lungs/Pleura: Stable emphysematous change in scattered pulmonary scarring. No suspicious pulmonary nodules or masses. No pleural effusion. No pneumothorax. Stable pericardial cyst in the right inferior pulmonary vein. Musculoskeletal: No acute osseous abnormality. No aggressive lytic or blastic lesion of bone. CT ABDOMEN PELVIS FINDINGS Hepatobiliary: Unremarkable noncontrast appearance of the hepatic parenchyma. Gallbladder is unremarkable. No biliary ductal dilation. Pancreas: Unremarkable. Spleen: Unremarkable. Adrenals/Urinary Tract: The adrenal glands are unremarkable. Next. No hydronephrosis. No nephrolithiasis. No contour deforming  renal lesions. Stable postsurgical changes cystectomy with urinary diversion procedure with ileal conduit. Stomach/Bowel: The stomach, duodenum, small bowel and colon are grossly unremarkable. Normal appendix. Terminal ileum is unremarkable. No acute inflammatory changes, mass lesions or obstructive findings. Vascular/Lymphatic: Stable advanced atherosclerotic calcifications of the aorta and branch vessels. Right iliac artery stent  is again visualized. No pathologically enlarged mesenteric retroperitoneal or pelvic lymph nodes. A few prominent scattered mesenteric lymph nodes are again noted and appear stable in size. Reproductive: Surgically absent. Other: Small volume pelvic free fluid. Unchanged appearance of the nodularity along the left pelvic sidewall surgical clips. Musculoskeletal: No aggressive lytic or blastic lesion of bone. Moderate lower lumbar degenerative change. Degenerative change of the SI joints and bilateral hips. IMPRESSION: 1. Stable postsurgical changes cystectomy with urinary diversion procedure with ileal conduit. 2. No evidence of new or progressive metastatic disease within the chest, abdomen, or pelvis. 3. Stable advanced atherosclerotic calcifications involving the aorta and branch vessels. 4. Stable emphysematous change and pulmonary scarring. 5. Emphysema and aortic atherosclerosis. Aortic Atherosclerosis (ICD10-I70.0) and Emphysema (ICD10-J43.9). Electronically Signed   By: Dahlia Bailiff MD   On: 08/19/2020 12:03     ASSESSMENT & PLAN:  1. Metastatic urothelial carcinoma ([place])   2. Encounter for antineoplastic immunotherapy   3. Anemia in stage 3b chronic kidney disease ([place])   4. Weight gain   5. Parastomal hernia without obstruction or gangrene   Cancer Staging Bladder carcinoma [facility]) Staging form: Urinary Bladder, AJCC 8th Edition - Clinical stage from 08/01/2018: Stage IVA (cTX, cN3, cM1a) - Signed by [name], MD on 04/21/2019  #Metastatic urothelial carcinoma of bladder, sarcomatoid features pelvic sidewall mass biopsy showed metastatic carcinoma consistent with involvement by urothelial carcinoma. Labs are reviewed and discussed with patient. Proceed with Keytruda.    #Accelerated hypertension.  Patient is made aware that she has increased risk of stroke, heart attack, aortic dissection etc.  I urged patient to discuss with primary care provider/pharmacy and get her medication.  I  also recommend her to go to emergency room if she is not able to get her blood pressure medication refilled today.  #Hernia -parastomal hernia.   She has establish care with [facility] hernia center.  She was recommended to monitor her symptoms and aware ostomy belt  #Chronic kidney disease, creatinine is getting worse.  Encourage oral hydration.  Avoid nephrotoxin.  #Chronic anemia secondary to stage III CKD.  Patient declines IV Venofer treatments we talked about option of IV Venofer treatment today again.   Hemoglobin is 10.1.  Monitor.  #History of diabetes. A1c few months ago was 8.4 History of diabetes which got better due to weight loss secondary to and neoplasm related to surgery and chemotherapy treatments.  Now her weight has been back to her presurgery baseline, and she has been persistently hyperglycemic.  I communicated with her primary care provider and we agreed that patient will likely need to be started on diabetic medication.  Current level of hyperglycemia is likely not going to be effective for her current level of hyperglycemia.  PCP will reach out to patient and restart her on Metformin.  #Weight gain.  TSH has been monitored and has been normal.  She has been referred to establish care with dietitian and no showed.   #Follow-up in 4 weeks for repeat blood work, MD evaluation and Keytruda.  Patient prefers every 4 weeks treatment versus every 3 weeks.   .All questions were answered. The patient knows to call the clinic with  any problems questions or concerns.  [name], MD, PhD Hematology Oncology [facility] at [facility] Pager- [phone] 09/06/20

## 2020-09-06 NOTE — Telephone Encounter (Signed)
Called and left a message for patient to call and schedule a follow up appt to discuss diabetic medication.

## 2020-09-06 NOTE — Telephone Encounter (Signed)
Please call patient and let her know that we need to restart her diabetes medication.  Can we see if patient will come in and have an appointment to discuss options.  With her kidney function, Metformin is not the best option so I would like to discuss this with her in person.

## 2020-09-06 NOTE — Progress Notes (Signed)
Patient denies new problems/concerns today.   °

## 2020-09-07 NOTE — Telephone Encounter (Signed)
Patient notified.  appt scheduled

## 2020-09-15 ENCOUNTER — Other Ambulatory Visit: Payer: Self-pay

## 2020-09-15 ENCOUNTER — Ambulatory Visit (INDEPENDENT_AMBULATORY_CARE_PROVIDER_SITE_OTHER): Payer: Medicare Other | Admitting: Nurse Practitioner

## 2020-09-15 ENCOUNTER — Encounter: Payer: Self-pay | Admitting: Nurse Practitioner

## 2020-09-15 VITALS — BP 144/75 | HR 97 | Temp 98.3°F | Wt 209.6 lb

## 2020-09-15 DIAGNOSIS — E118 Type 2 diabetes mellitus with unspecified complications: Secondary | ICD-10-CM

## 2020-09-15 MED ORDER — EMPAGLIFLOZIN 10 MG PO TABS: 10.0000 mg | ORAL_TABLET | Freq: Every day | ORAL | 0 refills | Status: DC

## 2020-09-15 MED ORDER — FREESTYLE LIBRE 2 SENSOR MISC: 1.0000 | Freq: Every day | 1 refills | Status: DC

## 2020-09-15 MED ORDER — FREESTYLE LIBRE 2 READER DEVI: 1.0000 | Freq: Every day | 0 refills | Status: DC

## 2020-09-15 NOTE — Progress Notes (Signed)
BP (!) 144/75 (BP Location: Left Arm, Cuff Size: Normal)   Pulse 97   Temp 98.3 F (36.8 C) (Oral)   Wt 209 lb 9.6 oz (95.1 kg)   SpO2 97%   BMI 37.13 kg/m    Subjective:    Patient ID: Angela Russell, female    DOB: 05-07-1951, 70 y.o.   MRN: [id number]  HPI: Angela Russell is a 70 y.o. female  Chief Complaint  Patient presents with  . Hyperglycemia    Per Dr. Tasia Catchings  . Hypertension  . Hyperlipidemia   Patient seen today due to elevated blood sugars at several appointments with Dr. Tasia Catchings.  Patient is hesitant to restart diabetic medication.  She is not checking blood sugars at home.  Has not been on medication for diabetes for awhile.  Did not realize her sugars were increasing.   Denies HA, CP, SOB, dizziness, palpitations, visual changes, and lower extremity swelling.   Relevant past medical, surgical, family and social history reviewed and updated as indicated. Interim medical history since our last visit reviewed. Allergies and medications reviewed and updated.  Review of Systems  Eyes: Negative for visual disturbance.  Respiratory: Negative for cough, chest tightness and shortness of breath.   Cardiovascular: Negative for chest pain, palpitations and leg swelling.  Neurological: Negative for dizziness and headaches.    Per HPI unless specifically indicated above     Objective:    BP (!) 144/75 (BP Location: Left Arm, Cuff Size: Normal)   Pulse 97   Temp 98.3 F (36.8 C) (Oral)   Wt 209 lb 9.6 oz (95.1 kg)   SpO2 97%   BMI 37.13 kg/m   Wt Readings from Last 3 Encounters:  09/15/20 209 lb 9.6 oz (95.1 kg)  09/06/20 210 lb (95.3 kg)  09/02/20 211 lb 4 oz (95.8 kg)    Physical Exam Vitals and nursing note reviewed.  Constitutional:      General: She is not in acute distress.    Appearance: Normal appearance. She is normal weight. She is not ill-appearing, toxic-appearing or diaphoretic.  HENT:     Head: Normocephalic.     Right Ear: External ear normal.      Left Ear: External ear normal.     Nose: Nose normal.     Mouth/Throat:     Mouth: Mucous membranes are moist.     Pharynx: Oropharynx is clear.  Eyes:     General:        Right eye: No discharge.        Left eye: No discharge.     Extraocular Movements: Extraocular movements intact.     Conjunctiva/sclera: Conjunctivae normal.     Pupils: Pupils are equal, round, and reactive to light.  Cardiovascular:     Rate and Rhythm: Normal rate and regular rhythm.     Heart sounds: No murmur heard.   Pulmonary:     Effort: Pulmonary effort is normal. No respiratory distress.     Breath sounds: Normal breath sounds. No wheezing or rales.  Musculoskeletal:     Cervical back: Normal range of motion and neck supple.  Skin:    General: Skin is warm and dry.     Capillary Refill: Capillary refill takes less than 2 seconds.  Neurological:     General: No focal deficit present.     Mental Status: She is alert and oriented to person, place, and time. Mental status is at baseline.  Psychiatric:  Mood and Affect: Mood normal.        Behavior: Behavior normal.        Thought Content: Thought content normal.        Judgment: Judgment normal.     Results for orders placed or performed in visit on 09/06/20  Comprehensive metabolic panel  Result Value Ref Range   Sodium 137 135 - 145 mmol/L   Potassium 4.3 3.5 - 5.1 mmol/L   Chloride 104 98 - 111 mmol/L   CO2 21 (L) 22 - 32 mmol/L   Glucose, Bld 211 (H) 70 - 99 mg/dL   BUN 32 (H) 8 - 23 mg/dL   Creatinine, Ser 1.83 (H) 0.44 - 1.00 mg/dL   Calcium 9.0 8.9 - 10.3 mg/dL   Total Protein 7.7 6.5 - 8.1 g/dL   Albumin 4.1 3.5 - 5.0 g/dL   AST 26 15 - 41 U/L   ALT 13 0 - 44 U/L   Alkaline Phosphatase 105 38 - 126 U/L   Total Bilirubin 0.5 0.3 - 1.2 mg/dL   GFR, Estimated 29 (L) >60 mL/min   Anion gap 12 5 - 15  CBC with Differential/Platelet  Result Value Ref Range   WBC 5.4 4.0 - 10.5 K/uL   RBC 4.23 3.87 - 5.11 MIL/uL    Hemoglobin 10.1 (L) 12.0 - 15.0 g/dL   HCT 32.8 (L) 36.0 - 46.0 %   MCV 77.5 (L) 80.0 - 100.0 fL   MCH 23.9 (L) 26.0 - 34.0 pg   MCHC 30.8 30.0 - 36.0 g/dL   RDW 14.2 11.5 - 15.5 %   Platelets 203 150 - 400 K/uL   nRBC 0.0 0.0 - 0.2 %   Neutrophils Relative % 68 %   Neutro Abs 3.7 1.7 - 7.7 K/uL   Lymphocytes Relative 18 %   Lymphs Abs 1.0 0.7 - 4.0 K/uL   Monocytes Relative 10 %   Monocytes Absolute 0.5 0.1 - 1.0 K/uL   Eosinophils Relative 3 %   Eosinophils Absolute 0.2 0.0 - 0.5 K/uL   Basophils Relative 1 %   Basophils Absolute 0.0 0.0 - 0.1 K/uL   Immature Granulocytes 0 %   Abs Immature Granulocytes 0.02 0.00 - 0.07 K/uL      Assessment & Plan:   Problem List Items Addressed This Visit      Endocrine   Controlled diabetes mellitus type 2 with complications ([place]) - Primary    Chronic.  Uncontrolled.  Due to rising blood sugars and patient agreeing will begin Jardiance.  Due to patient's CKD will start at [place] at 50m.  Will redraw CMP in one week to reassess patient's kidney function.   Patient would benefit from injectable GLP1 or SGLT2 but is hesitant to do injectable medication.  Referral placed for CCM.      Relevant Medications   empagliflozin (JARDIANCE) 10 MG TABS tablet   Other Relevant Orders   Comp Met (CMET)   AMB Referral to [facility]      Follow up plan: Return for Keep follow up scheduled.

## 2020-09-16 ENCOUNTER — Ambulatory Visit: Payer: Medicare Other | Admitting: Family Medicine

## 2020-09-18 ENCOUNTER — Encounter: Payer: Self-pay | Admitting: Nurse Practitioner

## 2020-09-18 NOTE — Assessment & Plan Note (Addendum)
Chronic.  Uncontrolled.  Due to rising blood sugars and patient agreeing will begin Jardiance.  Due to patient's CKD will start at [place] at 10mg .  Will redraw CMP in one week to reassess patient's kidney function.   Patient would benefit from injectable GLP1 or SGLT2 but is hesitant to do injectable medication.  Referral placed for CCM.

## 2020-09-20 ENCOUNTER — Telehealth: Payer: Self-pay

## 2020-09-20 NOTE — Chronic Care Management (AMB) (Signed)
  Chronic Care Management   Note  09/20/2020 Name: Angela Russell MRN: [id number] DOB: 04/02/1951  Angela Russell is a 70 y.o. year old female who is a primary care patient of [name], NP. I reached out to Angela Russell by phone today in response to a referral sent by Ms. Breckinridge Heyward's PCP, [name], NP     Ms. Malatesta was given information about Chronic Care Management services today including:  1. CCM service includes personalized support from designated clinical staff supervised by her physician, including individualized plan of care and coordination with other care providers 2. 24/7 contact phone numbers for assistance for urgent and routine care needs. 3. Service will only be billed when office clinical staff spend 20 minutes or more in a month to coordinate care. 4. Only one practitioner may furnish and bill the service in a calendar month. 5. The patient may stop CCM services at any time (effective at the end of the month) by phone call to the office staff. 6. The patient will be responsible for cost sharing (co-pay) of up to 20% of the service fee (after annual deductible is met).  Patient agreed to services and verbal consent obtained.   Follow up plan: Telephone appointment with care management team member scheduled for:RN CM 09/21/2020, Pharm D 09/28/2020  Noreene Larsson, [address], [place], [place] [address] Direct Dial: [phone] [email or web address] Website: [email or web address]

## 2020-09-21 ENCOUNTER — Telehealth: Payer: Medicare Other

## 2020-09-21 ENCOUNTER — Encounter: Payer: Self-pay | Admitting: *Deleted

## 2020-09-21 ENCOUNTER — Telehealth: Payer: Self-pay | Admitting: General Practice

## 2020-09-21 NOTE — Telephone Encounter (Signed)
  Chronic Care Management   Outreach Note  09/21/2020 Name: Angela Russell MRN: [id number] DOB: 1951-02-07  Referred by: [name], NP Reason for referral : Appointment (RNCM: Initial outreach for Chronic Disease Management and Care Coordination Needs )   An unsuccessful telephone outreach was attempted today. The patient was referred to the case management team for assistance with care management and care coordination.   Follow Up Plan: A HIPAA compliant phone message was left for the patient providing contact information and requesting a return call.   Noreene Larsson RN, MSN, [facility] Mobile: [phone]

## 2020-09-23 ENCOUNTER — Other Ambulatory Visit: Payer: Medicare Other

## 2020-09-23 ENCOUNTER — Other Ambulatory Visit: Payer: Self-pay

## 2020-09-23 DIAGNOSIS — E118 Type 2 diabetes mellitus with unspecified complications: Secondary | ICD-10-CM

## 2020-09-24 LAB — COMPREHENSIVE METABOLIC PANEL
ALT: 9 IU/L (ref 0–32)
AST: 16 IU/L (ref 0–40)
Albumin/Globulin Ratio: 1.6 (ref 1.2–2.2)
Albumin: 4.7 g/dL (ref 3.7–4.7)
Alkaline Phosphatase: 139 IU/L — ABNORMAL HIGH (ref 44–121)
BUN/Creatinine Ratio: 18 (ref 12–28)
BUN: 38 mg/dL — ABNORMAL HIGH (ref 8–27)
Bilirubin Total: 0.2 mg/dL (ref 0.0–1.2)
CO2: 20 mmol/L (ref 20–29)
Calcium: 9.7 mg/dL (ref 8.7–10.3)
Chloride: 104 mmol/L (ref 96–106)
Creatinine, Ser: 2.1 mg/dL — ABNORMAL HIGH (ref 0.57–1.00)
Globulin, Total: 2.9 g/dL (ref 1.5–4.5)
Glucose: 171 mg/dL — ABNORMAL HIGH (ref 65–99)
Potassium: 4.6 mmol/L (ref 3.5–5.2)
Sodium: 140 mmol/L (ref 134–144)
Total Protein: 7.6 g/dL (ref 6.0–8.5)
eGFR: 25 mL/min/{1.73_m2} — ABNORMAL LOW (ref >59)

## 2020-09-26 NOTE — Progress Notes (Signed)
Please let patient know that she needs to stop taking the Jardiance.  Although it helped her sugar, her creatinine increased.  With that said, our other options as far as medications for her diabetes are very limited. I know patient is hesitant to do an injectable but it is the best way to get her diabetes under control as well as help her kidney function.  We have samples here in the office that I can show her how to use and we can do her first injection together.  Please let me know if patient agrees and if she does, please make her an appointment.

## 2020-09-27 ENCOUNTER — Telehealth: Payer: Self-pay

## 2020-09-27 NOTE — Progress Notes (Signed)
Chronic Care Management Pharmacy Note  10/02/2020 Name:  MICAH Russell MRN:  [id number] DOB:  [birth year]  Subjective: Angela Russell is an 70 y.o. year old female who is a primary patient of Angela Billings, NP.  The CCM team was consulted for assistance with disease management and care coordination needs.    Engaged with patient by telephone for initial visit in response to provider referral for pharmacy case management and/or care coordination services.   Consent to Services:  The patient was given the following information about Chronic Care Management services today, agreed to services, and gave verbal consent: 1. CCM service includes personalized support from designated clinical staff supervised by the primary care provider, including individualized plan of care and coordination with other care providers 2. 24/7 contact phone numbers for assistance for urgent and routine care needs. 3. Service will only be billed when office clinical staff spend 20 minutes or more in a month to coordinate care. 4. Only one practitioner may furnish and bill the service in a calendar month. 5.The patient may stop CCM services at any time (effective at the end of the month) by phone call to the office staff. 6. The patient will be responsible for cost sharing (co-pay) of up to 20% of the service fee (after annual deductible is met). Patient agreed to services and consent obtained.  Patient Care Team: Angela Billings, NP as PCP - General Angela Russell, [name], OD (Optometry) [name], MD as Consulting Physician (Hematology and Oncology) [name], [facility] (Pharmacist) Vanita Ingles, RN as Registered Nurse (General Practice)  Recent office visits: 4/14/22Mathis Dad, NP (PCP)- CMET,  GFR too low for Jardiance recommend GLP-1, pt refuses, ccm referral  Recent consult visits: 09/06/20- Angela Russell (hem/onc) Keytruda 200 mg infusion, next in 42 days-- metastatic urothelial cancer 08/11/20- Angela Russell [facility] gen surg)  parastomal hernia eval,  03/10/20- Angela Russell(nephrology)- baselin scr 1.53 GFR 34-40, bupmp to 2.35 sept carpal tunnel  surgery and ibuprofen - return in 3 months 03/09/21- Angela Russell (optometry)- retinal exam Hospital visits: None in previous 6 months  Objective:  Lab Results  Component Value Date   CREATININE 2.10 (H) 09/23/2020   BUN 38 (H) 09/23/2020   GFRNONAA 29 (L) 09/06/2020   GFRAA 27 (L) 03/01/2020   NA 140 09/23/2020   K 4.6 09/23/2020   CALCIUM 9.7 09/23/2020   CO2 20 09/23/2020   GLUCOSE 171 (H) 09/23/2020    Lab Results  Component Value Date/Time   HGBA1C 8.4 (H) 08/02/2020 12:54 PM   HGBA1C 7.6 (H) 03/11/2020 04:47 PM   HGBA1C 7.2 (H) 12/06/2017 04:14 PM   HGBA1C 7.5 (H) 09/06/2017 03:30 PM   HGBA1C per care everywhere 10/15/2016 12:00 AM   MICROALBUR 150 (H) 03/11/2020 04:44 PM   MICROALBUR per care everywhere 07/16/2016 12:00 AM    Last diabetic Eye exam:  Lab Results  Component Value Date/Time   HMDIABEYEEXA No Retinopathy 04/27/2020 12:00 AM    Last diabetic Foot exam: No results found for: HMDIABFOOTEX   Lab Results  Component Value Date   CHOL 133 03/11/2020   HDL 60 03/11/2020   LDLCALC 55 03/11/2020   TRIG 97 03/11/2020    Hepatic Function Latest Ref Rng & Units 09/23/2020 09/06/2020 08/30/2020  Total Protein 6.0 - 8.5 g/dL 7.6 7.7 7.8  Albumin 3.7 - 4.7 g/dL 4.7 4.1 4.2  AST 0 - 40 IU/L 16 26 23   ALT 0 - 32 IU/L 9 13 14   Alk Phosphatase 44 - 121 IU/L  139(H) 105 95  Total Bilirubin 0.0 - 1.2 mg/dL 0.2 0.5 0.5  Bilirubin, Direct 0.0 - 0.2 mg/dL - - -    Lab Results  Component Value Date/Time   TSH 2.028 08/30/2020 08:43 AM   TSH 3.135 07/12/2020 09:17 AM    CBC Latest Ref Rng & Units 09/06/2020 08/30/2020 08/02/2020  WBC 4.0 - 10.5 K/uL 5.4 4.9 5.5  Hemoglobin 12.0 - 15.0 g/dL 10.1(L) 9.8(L) 9.5(L)  Hematocrit 36.0 - 46.0 % 32.8(L) 31.5(L) 30.8(L)  Platelets 150 - 400 K/uL 203 202 213    No results found for: VD25OH  Clinical ASCVD: Yes  The  10-year ASCVD risk score Angela Russell DC Jr., et al., 2013) is: 24.3%   Values used to calculate the score:     Age: 75 years     Sex: Female     Is Non-Hispanic African American: Yes     Diabetic: Yes     Tobacco smoker: No     Systolic Blood Pressure: 761 mmHg     Is BP treated: Yes     HDL Cholesterol: 60 mg/dL     Total Cholesterol: 133 mg/dL    Depression screen [facility] 2/9 09/15/2020 06/22/2019 09/06/2017  Decreased Interest 0 0 1  Down, Depressed, Hopeless 0 0 0  PHQ - 2 Score 0 0 1  Some recent data might be hidden       Social History   Tobacco Use  Smoking Status Former Smoker  . Quit date: 06/05/1991  . Years since quitting: 29.3  Smokeless Tobacco Never Used   BP Readings from Last 3 Encounters:  09/15/20 (!) 144/75  09/06/20 (!) 144/85  09/02/20 (!) 169/81   Pulse Readings from Last 3 Encounters:  09/15/20 97  09/06/20 94  09/06/20 (!) 102   Wt Readings from Last 3 Encounters:  09/15/20 209 lb 9.6 oz (95.1 kg)  09/06/20 210 lb (95.3 kg)  09/02/20 211 lb 4 oz (95.8 kg)   BMI Readings from Last 3 Encounters:  09/15/20 37.13 kg/m  09/06/20 37.20 kg/m  09/02/20 37.42 kg/m    Assessment/Interventions: Review of patient past medical history, allergies, medications, health status, including review of consultants reports, laboratory and other test data, was performed as part of comprehensive evaluation and provision of chronic care management services.   SDOH:  (Social Determinants of Health) assessments and interventions performed: Yes  SDOH Screenings   Alcohol Screen: Not on file  Depression (PHQ2-9): Low Risk   . PHQ-2 Score: 0  Financial Resource Strain: Low Risk   . Difficulty of Paying Living Expenses: Not hard at all  Food Insecurity: Not on file  Housing: Not on file  Physical Activity: Not on file  Social Connections: Not on file  Stress: Not on file  Tobacco Use: Medium Risk  . Smoking Tobacco Use: Former Smoker  . Smokeless Tobacco Use: Never  Used  Transportation Needs: Not on file     Immunization History  Administered Date(s) Administered  . Fluad Quad(high Dose 65+) 06/22/2019, 03/11/2020  . Influenza Split 03/25/2015  . Influenza, High Dose Seasonal PF 05/31/2017, 04/11/2018  . PFIZER(Purple Top)SARS-COV-2 Vaccination 09/04/2019, 09/29/2019, 05/20/2020  . Pneumococcal Conjugate-13 05/04/2017  . Pneumococcal Polysaccharide-23 04/09/2013    Conditions to be addressed/monitored:  Hypertension, Hyperlipidemia, Diabetes, Coronary Artery Disease, Chronic Kidney Disease and anemia of CKD, metastatic urothelial cancer  Care Plan : [place]  Updates made by [name], [place] since 10/02/2020 12:00 AM    Problem: DM, HTN,  HLD, anemia, CKD, Cancer- metatstatic   Priority: High    Goal: Patient-Specific Goal   Start Date: 09/28/2020  This Visit's Progress: Not on track  Priority: High  Note:   Current Barriers:  . Unable to independently monitor therapeutic efficacy . Unable to achieve control of diabetes  . Suboptimal therapeutic regimen for diabetes . Unable to self administer medications as prescribed . Does not adhere to prescribed medication regimen . Does not maintain contact with provider office  Pharmacist Clinical Goal(s):  Marland Kitchen Patient will achieve adherence to monitoring guidelines and medication adherence to achieve therapeutic efficacy . achieve control of diabetes as evidenced by lab values . adhere to plan to optimize therapeutic regimen for diabetes as evidenced by report of adherence to recommended medication management changes . achieve ability to self administer medications as prescribed through use of pill box, patient assitance if needed as evidenced by patient report . adhere to prescribed medication regimen as evidenced by fill dates . contact provider office for questions/concerns as evidenced notation of same in electronic health record through collaboration with PharmD and provider.    Interventions: . 1:1 collaboration with Angela Billings, NP regarding development and update of comprehensive plan of care as evidenced by provider attestation and co-signature . Inter-disciplinary care team collaboration (see longitudinal plan of care) . Comprehensive medication review performed; medication list updated in electronic medical record BP Readings from Last 3 Encounters:  09/15/20 (!) 144/75  09/06/20 (!) 144/85  09/02/20 (!) 169/81   Hypertension CKD IIIb(BP goal <140/90) -Not ideally controlled -Current treatment: . Amlodipine 5 mg qd . Losartan 25 mg qd -Medications previously tried: NA -Current home readings: unable to provide readings but does check at home per her report. 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 -Current dietary habits: eats breakfast and supper daily -Current exercise habits: rides stationary bike 30 minutes, 3 days per week -Reports hypotensive/hypertensive symptoms -Educated on BP goals and benefits of medications for prevention of heart attack, stroke and kidney damage; Daily salt intake goal < 2300 mg; Exercise goal of 150 minutes per week; Importance of home blood pressure monitoring; -Counseled to monitor BP at home daily, document, and provide log at future appointments -Counseled on diet and exercise extensively Recommended to continue current medication  Recommended patient schedule follow up with Dr. Candiss Norse  Lab Results  Component Value Date   [place] 55 03/11/2020    Hyperlipidemia: (LDL goal < 70) -Controlled -Current treatment: . Rosuvastatin 40 mg qd -Medications previously tried: na  --Educated on Cholesterol goals;  Benefits of statin for ASCVD risk reduction; Importance of limiting foods high in cholesterol; Exercise goal of 150 minutes per week; -Counseled on diet and exercise extensively Recommended to continue current medication.  Lab Results  Component Value Date   HGBA1C 8.4 (H)  08/02/2020   Lab Results  Component Value Date   CREATININE 2.10 (H) 09/23/2020   EGFR~ 25 ml/min  Diabetes CKD III b (A1c goal <7%) -Not ideally controlled -Current medications: . None -Medications previously tried: jardiance, metformin  -Current home glucose readings . fasting glucose: Not checking, meter is broken . post prandial glucose: not checking -Denies hypoglycemic/hyperglycemic symptoms --Educated on A1c and blood sugar goals; Complications of diabetes including kidney damage, retinal damage, and cardiovascular disease; Exercise goal of 150 minutes per week; Benefits of weight loss; Benefits of routine self-monitoring of blood sugar; Continuous glucose monitoring; Patient would like a [employer]. Will collaborate with PCP to send through [employer]  medical to see if coverage available -Counseled to check feet daily and get yearly eye exams -Counseled on diet and exercise extensively Counseled on weight loss and A1c reduction of weekly GLP-1 injection. 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 Patient fearful any injectable will cost her job. Recommended patient start Rybelsus 31m x 30 days then increase to 7 mg daily.  Obtain new glucometer and begin checking bid - tid. Collaborated with PCP to send in new RX for Rybelsus. Will initiate patient assistance if needed. Assessed patient finances. Will initiate patient assistance if co-pay cost prohibitive for Rybelsus.  Recommend consider Farxiga 10 mg qd for renal protection if SCR normalizes and egfr> 280mmin.  Will not receive BG benefit due to low GFR.  Iron/TIBC/Ferritin/ %Sat    Component Value Date/Time   IRON 68 08/02/2020 1254   IRON 26 (L) 02/04/2019 1309   TIBC 312 08/02/2020 1254   TIBC 251 02/04/2019 1309   FERRITIN 197 08/02/2020 1254   FERRITIN 409 (H) 02/04/2019 1309   IRONPCTSAT 22 08/02/2020 1254   IRONPCTSAT 10 (L) 02/04/2019 1309   CBC    Component Value  Date/Time   WBC 5.4 09/06/2020 0830   RBC 4.23 09/06/2020 0830   HGB 10.1 (L) 09/06/2020 0830   HGB 10.7 (L) 01/11/2017 1529   HCT 32.8 (L) 09/06/2020 0830   HCT 34.4 01/11/2017 1529   PLT 203 09/06/2020 0830   PLT 292 01/11/2017 1529   MCV 77.5 (L) 09/06/2020 0830   MCV 77 (L) 01/11/2017 1529   MCH 23.9 (L) 09/06/2020 0830   MCHC 30.8 09/06/2020 0830   RDW 14.2 09/06/2020 0830   RDW 14.8 01/11/2017 1529   LYMPHSABS 1.0 09/06/2020 0830   LYMPHSABS 1.9 01/11/2017 1529   MONOABS 0.5 09/06/2020 0830   EOSABS 0.2 09/06/2020 0830   EOSABS 0.1 01/11/2017 1529   BASOSABS 0.0 09/06/2020 0830   BASOSABS 0.0 01/11/2017 1529    Anemia of CKD (Goal: prevent symptoms, avoid complications) -Uncontrolled -Current treatment  . Ferrous sulfate 325 mg qam (does not take regularly due to constipation) -Medications previously tried: NA  -Counseled on diet and exercise extensively Recommended to continue current medication Recommended patient consider IV Venofer infusion as recommended by hematology.   Patient Goals/Self-Care Activities . Patient will:  - take medications as prescribed focus on medication adherence by using pill box check glucose twice daily, document, and provide at future appointments check blood pressure daily, document, and provide at future appointments collaborate with provider on medication access solutions target a minimum of 150 minutes of moderate intensity exercise weekly  Follow Up Plan: Telephone follow up appointment with care management team member scheduled for:      Medication Assistance: None required.  Patient affirms current coverage meets needs.  Patient's preferred pharmacy is:  [place] [place] [address] [id number]: [phone]: [place] [address]:  [phone]: [phone] pill box? No - doesn't need Pt endorses 90% compliance  We discussed: Benefits of medication synchronization, packaging and delivery as well as enhanced pharmacist oversight with [employer]. Patient decided to: Continue current medication management strategy  Care Plan and Follow Up Patient Decision:  Patient agrees to Care Plan and Follow-up.  Plan: Telephone follow up appointment with care  management team member scheduled for:  1 month CPA, 2 months PharmD  Junita Push. Kenton Kingfisher PharmD, [facility] [phone]

## 2020-09-27 NOTE — Chronic Care Management (AMB) (Signed)
Chronic Care Management Pharmacy Assistant   Name: Angela Russell  MRN: [id number] DOB: December 03, 1950  Angela Russell is an 70 y.o. year old female who presents for her initial CCM visit with the clinical pharmacist.  Reason for Encounter: Initial CCM Visit   Recent office visits:  09/15/20-[name] Dad, NP(PCP)-start jardiance 10 mg 09/02/20-[name] Dad, NP(PCP)-Follow up 1 month 04/22/20- Noemi Chapel, NP(PCP)-Follow up 3 month or sooner if BP is >130/80  Recent consult visits:  08/30/20-[name], MD(Oncology)-Follow up 1 week for evaluation prior to [facility] treatment  08/11/20-Arielle Sharen Counter (General Surgery)-referral to wound ostomy nursing team, follow up as needed  08/02/20-[name], MD(Oncology)-Keytruda treatment today, follow up prior to [facility] treatment  07/12/20-[name], MD(Oncology)-Keytruda Treatment, [facility], follow up prior to [facility] treatment  06/21/20-[name], MD(Oncology)- Beryle Flock Treatment,follow up prior to [facility] treatment  05/31/20-[name], MD(Oncology)-follow up prior to [place] treatment 05/03/20-[name], MD(Oncology)-Keytruda Treatment, follow up prior to [facility] treatment 04/12/20- [name], MD(oncology)- Beryle Flock Treatment, CT scan ordered, Follow up in 3 weeks     Hospital visits:  None in previous 6 months  Medications: Outpatient Encounter Medications as of 09/27/2020  Medication Sig  . amLODipine (NORVASC) 5 MG tablet Take 1 tablet (5 mg total) by mouth daily.  . Continuous Blood Gluc Receiver (FREESTYLE LIBRE 2 READER) DEVI 1 applicator by Does not apply route daily.  . Continuous Blood Gluc Sensor (FREESTYLE LIBRE 2 SENSOR) MISC 1 applicator by Does not apply route daily.  . diclofenac sodium (VOLTAREN) 1 % GEL Apply 2 g topically 4 (four) times daily. (Patient taking differently: Apply 2 g topically as needed.)  . docusate sodium (COLACE) 100 MG capsule Take 1 capsule (100 mg total) by mouth  daily.  . empagliflozin (JARDIANCE) 10 MG TABS tablet Take 1 tablet (10 mg total) by mouth daily.  . ferrous sulfate 325 (65 FE) MG EC tablet Take 325 mg by mouth daily with breakfast. OTC  . lidocaine-prilocaine (EMLA) cream Apply 1 application topically as needed.  Marland Kitchen losartan (COZAAR) 25 MG tablet Take 1 tablet (25 mg total) by mouth daily.  . polyethylene glycol (MIRALAX / GLYCOLAX) 17 g packet Take 17 g by mouth daily as needed for mild constipation.  . rosuvastatin (CRESTOR) 40 MG tablet Take 1 tablet (40 mg total) by mouth daily.  . [DISCONTINUED] prochlorperazine (COMPAZINE) 10 MG tablet Take 1 tablet (10 mg total) by mouth every 6 (six) hours as needed (Nausea or vomiting).   No facility-administered encounter medications on file as of 09/27/2020.   Have you seen any other providers since your last visit?Patient states she has not seen anyone since her PCP.  Any changes in your medications or health? Jardiance was added 09/15/2020 by [name], NP  Any side effects from any medications? Patient reports slightly feeling lightheaded since starting the jardiance but this has since resolved.  Do you have an symptoms or problems not managed by your medications? Light headed which has resolved.  Any concerns about your health right now? Patient states she is concerned about the jardiance affecting her kidneys.Patient is concerned about weight gain and would like to have help with a diet.Patient is exercising 3 times a week with no weight loss.   Has your provider asked that you check blood pressure, blood sugar, or follow special diet at home? Patient states her blood sugar machine is broken. She would like to have the Nixon but states it is too expensive.  Do you get  any type of exercise on a regular basis?  Patient states she has a stationary exercise bike at home and uses this 3 times per week.  Can you think of a goal you would like to reach for your health? Patient would like  to loose weight and would like help with a weight loss regimen.  Do you have any problems getting your medications? Patient states she does not have any problems getting her medications.  Is there anything that you would like to discuss during the appointment?  Patient stated weight loss is the main thing so that she can reduce her A1C.  Please bring medications and supplements to appointment    Star Rating Drugs: Jardiance 10 mg last filled 09/15/20 90 DS Losartan 25 mg last filled 07/12/20 90 DS Rosuvastatin 40 mg last filled 07/12/20 90 DS  [facility] Clinical Pharmacist Assistant [phone]

## 2020-09-27 NOTE — Progress Notes (Signed)
Please let patient know that the injectable that I am discussing is not insulin.  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  Please let me know if patient still does not want to start the Trulicity.

## 2020-09-28 ENCOUNTER — Ambulatory Visit (INDEPENDENT_AMBULATORY_CARE_PROVIDER_SITE_OTHER): Payer: Medicare Other | Admitting: Pharmacist

## 2020-09-28 DIAGNOSIS — E118 Type 2 diabetes mellitus with unspecified complications: Secondary | ICD-10-CM

## 2020-09-28 DIAGNOSIS — I10 Essential (primary) hypertension: Secondary | ICD-10-CM

## 2020-09-29 ENCOUNTER — Telehealth: Payer: Self-pay

## 2020-09-29 ENCOUNTER — Telehealth: Payer: Self-pay | Admitting: Nurse Practitioner

## 2020-09-29 MED ORDER — RYBELSUS 3 MG PO TABS: 3.0000 mg | ORAL_TABLET | Freq: Every day | ORAL | 0 refills | Status: DC

## 2020-09-29 NOTE — Telephone Encounter (Signed)
Rybelsus sent in for patient.  I also placed orders for labs for patient to come back and have her kidney function checked in 2 weeks.  We are working on getting her some assistance for the cost.  I have spoken with Almyra Free who is actively working on this if it is too expensive.

## 2020-09-29 NOTE — Chronic Care Management (AMB) (Addendum)
    Chronic Care Management Pharmacy Assistant   Name: Angela Russell  MRN: [id number] DOB: 1950/12/28   Reason for Encounter: Patient Assistance Program    Medications: Outpatient Encounter Medications as of 09/29/2020  Medication Sig   amLODipine (NORVASC) 5 MG tablet Take 1 tablet (5 mg total) by mouth daily.   Continuous Blood Gluc Receiver (FREESTYLE LIBRE 2 READER) DEVI 1 applicator by Does not apply route daily.   Continuous Blood Gluc Sensor (FREESTYLE LIBRE 2 SENSOR) MISC 1 applicator by Does not apply route daily.   diclofenac sodium (VOLTAREN) 1 % GEL Apply 2 g topically 4 (four) times daily. (Patient taking differently: Apply 2 g topically as needed.)   docusate sodium (COLACE) 100 MG capsule Take 1 capsule (100 mg total) by mouth daily.   empagliflozin (JARDIANCE) 10 MG TABS tablet Take 1 tablet (10 mg total) by mouth daily. (Patient not taking: Reported on 09/28/2020)   ferrous sulfate 325 (65 FE) MG EC tablet Take 325 mg by mouth daily with breakfast. OTC (Patient not taking: Reported on 09/28/2020)   lidocaine-prilocaine (EMLA) cream Apply 1 application topically as needed.   losartan (COZAAR) 25 MG tablet Take 1 tablet (25 mg total) by mouth daily.   pembrolizumab (KEYTRUDA) 100 MG/4ML SOLN Inject 200 mg into the vein. Every 42 days,    Last txy 4/05, Next 5/03   polyethylene glycol (MIRALAX / GLYCOLAX) 17 g packet Take 17 g by mouth daily as needed for mild constipation.   rosuvastatin (CRESTOR) 40 MG tablet Take 1 tablet (40 mg total) by mouth daily.   Semaglutide (RYBELSUS) 3 MG TABS Take 3 mg by mouth daily.   [DISCONTINUED] prochlorperazine (COMPAZINE) 10 MG tablet Take 1 tablet (10 mg total) by mouth every 6 (six) hours as needed (Nausea or vomiting).   No facility-administered encounter medications on file as of 09/29/2020.    Mailed Patient Assistance application for Rybelsus to patient.   [name] Clinical Pharmacist Assistant [phone]

## 2020-09-29 NOTE — Telephone Encounter (Signed)
Patient notified

## 2020-10-02 NOTE — Patient Instructions (Addendum)
Visit Information  It was a pleasure speaking with you today! Thank you for letting me be a part of your care team. Please call with any questions or concerns.  Goals Addressed            This Visit's Progress   . Monitor and Manage My Blood Sugar-Diabetes Type 2       Timeframe:  Short-Term Goal Priority:  High Start Date:                             Expected End Date:                       Follow Up Date one month CPA follow up    - check blood sugar at prescribed times - check blood sugar before and after exercise - check blood sugar if I feel it is too high or too low - take the blood sugar meter to all doctor visits    Why is this important?    Checking your blood sugar at home helps to keep it from getting very high or very low.   Writing the results in a diary or log helps the doctor know how to care for you.   Your blood sugar log should have the time, date and the results.   Also, write down the amount of insulin or other medicine that you take.   Other information, like what you ate, exercise done and how you were feeling, will also be helpful.     Notes:     . Track and Manage My Blood Pressure-Hypertension       Timeframe:  Long-Range Goal Priority:  High Start Date:                             Expected End Date:                       Follow Up Date one month CPA follow-up    - check blood pressure daily - write blood pressure results in a log or diary    Why is this important?    You won't feel high blood pressure, but it can still hurt your blood vessels.   High blood pressure can cause heart or kidney problems. It can also cause a stroke.   Making lifestyle changes like losing a little weight or eating less salt will help.   Checking your blood pressure at home and at different times of the day can help to control blood pressure.   If the doctor prescribes medicine remember to take it the way the doctor ordered.   Call the office if you  cannot afford the medicine or if there are questions about it.     Notes:        Ms. Corum was given information about Chronic Care Management services today including:  1. CCM service includes personalized support from designated clinical staff supervised by her physician, including individualized plan of care and coordination with other care providers 2. 24/7 contact phone numbers for assistance for urgent and routine care needs. 3. Standard insurance, coinsurance, copays and deductibles apply for chronic care management only during months in which we provide at least 20 minutes of these services. Most insurances cover these services at 100%, however patients may be responsible for any copay, coinsurance and/or deductible if  applicable. This service may help you avoid the need for more expensive face-to-face services. 4. Only one practitioner may furnish and bill the service in a calendar month. 5. The patient may stop CCM services at any time (effective at the end of the month) by phone call to the office staff.  Patient agreed to services and verbal consent obtained.   The patient verbalized understanding of instructions, educational materials, and care plan provided today and agreed to receive a mailed copy of patient instructions, educational materials, and care plan.  The pharmacy team will reach out to the patient again over the next 30 days.   Junita Push. Hayley Horn PharmD, BCPS Clinical Pharmacist [phone]  Managing Your Hypertension Hypertension, also called high blood pressure, is when the force of the blood pressing against the walls of the arteries is too strong. Arteries are blood vessels that carry blood from your heart throughout your body. Hypertension forces the heart to work harder to pump blood and may cause the arteries to become narrow or stiff. Understanding blood pressure readings Your personal target blood pressure may vary depending on your medical conditions,  your age, and other factors. A blood pressure reading includes a higher number over a lower number. Ideally, your blood pressure should be below 120/80. You should know that:  The first, or top, number is called the systolic pressure. It is a measure of the pressure in your arteries as your heart beats.  The second, or bottom number, is called the diastolic pressure. It is a measure of the pressure in your arteries as the heart relaxes. Blood pressure is classified into four stages. Based on your blood pressure reading, your health care provider may use the following stages to determine what type of treatment you need, if any. Systolic pressure and diastolic pressure are measured in a unit called mmHg. Normal  Systolic pressure: below 594.  Diastolic pressure: below 80. Elevated  Systolic pressure: 585-929.  Diastolic pressure: below 80. Hypertension stage 1  Systolic pressure: 244-628.  Diastolic pressure: 63-81. Hypertension stage 2  Systolic pressure: 771 or above.  Diastolic pressure: 90 or above. How can this condition affect me? Managing your hypertension is an important responsibility. Over time, hypertension can damage the arteries and decrease blood flow to important parts of the body, including the brain, heart, and kidneys. Having untreated or uncontrolled hypertension can lead to:  A heart attack.  A stroke.  A weakened blood vessel (aneurysm).  Heart failure.  Kidney damage.  Eye damage.  Metabolic syndrome.  Memory and concentration problems.  Vascular dementia. What actions can I take to manage this condition? Hypertension can be managed by making lifestyle changes and possibly by taking medicines. Your health care provider will help you make a plan to bring your blood pressure within a normal range. Nutrition  Eat a diet that is high in fiber and potassium, and low in salt (sodium), added sugar, and fat. An example eating plan is called the Dietary  Approaches to Stop Hypertension (DASH) diet. To eat this way: ? Eat plenty of fresh fruits and vegetables. Try to fill one-half of your plate at each meal with fruits and vegetables. ? Eat whole grains, such as whole-wheat pasta, brown rice, or whole-grain bread. Fill about one-fourth of your plate with whole grains. ? Eat low-fat dairy products. ? Avoid fatty cuts of meat, processed or cured meats, and poultry with skin. Fill about one-fourth of your plate with lean proteins such as fish, chicken without skin,  beans, eggs, and tofu. ? Avoid pre-made and processed foods. These tend to be higher in sodium, added sugar, and fat.  Reduce your daily sodium intake. Most people with hypertension should eat less than 1,500 mg of sodium a day.   Lifestyle  Work with your health care provider to maintain a healthy body weight or to lose weight. Ask what an ideal weight is for you.  Get at least 30 minutes of exercise that causes your heart to beat faster (aerobic exercise) most days of the week. Activities may include walking, swimming, or biking.  Include exercise to strengthen your muscles (resistance exercise), such as weight lifting, as part of your weekly exercise routine. Try to do these types of exercises for 30 minutes at least 3 days a week.  Do not use any products that contain nicotine or tobacco, such as cigarettes, e-cigarettes, and chewing tobacco. If you need help quitting, ask your health care provider.  Control any long-term (chronic) conditions you have, such as high cholesterol or diabetes.  Identify your sources of stress and find ways to manage stress. This may include meditation, deep breathing, or making time for fun activities.   Alcohol use  Do not drink alcohol if: ? Your health care provider tells you not to drink. ? You are pregnant, may be pregnant, or are planning to become pregnant.  If you drink alcohol: ? Limit how much you use to:  0-1 drink a day for  women.  0-2 drinks a day for men. ? Be aware of how much alcohol is in your drink. In the [place] one drink equals one 12 oz bottle of beer (355 mL), one 5 oz glass of wine (148 mL), or one 1 oz glass of hard liquor (44 mL). Medicines Your health care provider may prescribe medicine if lifestyle changes are not enough to get your blood pressure under control and if:  Your systolic blood pressure is 130 or higher.  Your diastolic blood pressure is 80 or higher. Take medicines only as told by your health care provider. Follow the directions carefully. Blood pressure medicines must be taken as told by your health care provider. The medicine does not work as well when you skip doses. Skipping doses also puts you at risk for problems. Monitoring Before you monitor your blood pressure:  Do not smoke, drink caffeinated beverages, or exercise within 30 minutes before taking a measurement.  Use the bathroom and empty your bladder (urinate).  Sit quietly for at least 5 minutes before taking measurements. Monitor your blood pressure at home as told by your health care provider. To do this:  Sit with your back straight and supported.  Place your feet flat on the floor. Do not cross your legs.  Support your arm on a flat surface, such as a table. Make sure your upper arm is at heart level.  Each time you measure, take two or three readings one minute apart and record the results. You may also need to have your blood pressure checked regularly by your health care provider.   General information  Talk with your health care provider about your diet, exercise habits, and other lifestyle factors that may be contributing to hypertension.  Review all the medicines you take with your health care provider because there may be side effects or interactions.  Keep all visits as told by your health care provider. Your health care provider can help you create and adjust your plan for managing your high blood  pressure. Where to find more information  National Heart, Lung, and Blood Institute: https://wilson-eaton.com/  American Heart Association: www.heart.org Contact a health care provider if:  You think you are having a reaction to medicines you have taken.  You have repeated (recurrent) headaches.  You feel dizzy.  You have swelling in your ankles.  You have trouble with your vision. Get help right away if:  You develop a severe headache or confusion.  You have unusual weakness or numbness, or you feel faint.  You have severe pain in your chest or abdomen.  You vomit repeatedly.  You have trouble breathing. These symptoms may represent a serious problem that is an emergency. Do not wait to see if the symptoms will go away. Get medical help right away. Call your local emergency services (911 in the [place] Do not drive yourself to the hospital. Summary  Hypertension is when the force of blood pumping through your arteries is too strong. If this condition is not controlled, it may put you at risk for serious complications.  Your personal target blood pressure may vary depending on your medical conditions, your age, and other factors. For most people, a normal blood pressure is less than 120/80.  Hypertension is managed by lifestyle changes, medicines, or both.  Lifestyle changes to help manage hypertension include losing weight, eating a healthy, low-sodium diet, exercising more, stopping smoking, and limiting alcohol. This information is not intended to replace advice given to you by your health care provider. Make sure you discuss any questions you have with your health care provider. Document Revised: 06/26/2019 Document Reviewed: 04/21/2019 Elsevier Patient Education  2021 [employer].

## 2020-10-03 ENCOUNTER — Encounter: Payer: Self-pay | Admitting: Nurse Practitioner

## 2020-10-03 ENCOUNTER — Telehealth: Payer: Self-pay | Admitting: Nurse Practitioner

## 2020-10-03 DIAGNOSIS — E1165 Type 2 diabetes mellitus with hyperglycemia: Secondary | ICD-10-CM | POA: Insufficient documentation

## 2020-10-03 DIAGNOSIS — E118 Type 2 diabetes mellitus with unspecified complications: Secondary | ICD-10-CM | POA: Insufficient documentation

## 2020-10-03 NOTE — Telephone Encounter (Signed)
Diagnosis changed from controlled to uncontrolled due to patient's A1c being above goal.

## 2020-10-04 ENCOUNTER — Other Ambulatory Visit: Payer: Self-pay

## 2020-10-04 ENCOUNTER — Telehealth: Payer: Self-pay | Admitting: Nurse Practitioner

## 2020-10-04 ENCOUNTER — Encounter: Payer: Self-pay | Admitting: Oncology

## 2020-10-04 ENCOUNTER — Inpatient Hospital Stay: Payer: Medicare Other | Attending: Oncology

## 2020-10-04 ENCOUNTER — Inpatient Hospital Stay: Payer: Medicare Other

## 2020-10-04 ENCOUNTER — Inpatient Hospital Stay (HOSPITAL_BASED_OUTPATIENT_CLINIC_OR_DEPARTMENT_OTHER): Payer: Medicare Other | Admitting: Oncology

## 2020-10-04 VITALS — BP 137/73 | HR 92 | Temp 97.1°F | Resp 16 | Wt 207.7 lb

## 2020-10-04 DIAGNOSIS — N1832 Chronic kidney disease, stage 3b: Secondary | ICD-10-CM | POA: Diagnosis not present

## 2020-10-04 DIAGNOSIS — M810 Age-related osteoporosis without current pathological fracture: Secondary | ICD-10-CM | POA: Insufficient documentation

## 2020-10-04 DIAGNOSIS — I129 Hypertensive chronic kidney disease with stage 1 through stage 4 chronic kidney disease, or unspecified chronic kidney disease: Secondary | ICD-10-CM | POA: Insufficient documentation

## 2020-10-04 DIAGNOSIS — R635 Abnormal weight gain: Secondary | ICD-10-CM | POA: Diagnosis not present

## 2020-10-04 DIAGNOSIS — Z86718 Personal history of other venous thrombosis and embolism: Secondary | ICD-10-CM | POA: Insufficient documentation

## 2020-10-04 DIAGNOSIS — D631 Anemia in chronic kidney disease: Secondary | ICD-10-CM | POA: Diagnosis not present

## 2020-10-04 DIAGNOSIS — C7910 Secondary malignant neoplasm of unspecified urinary organs: Secondary | ICD-10-CM | POA: Diagnosis not present

## 2020-10-04 DIAGNOSIS — M47816 Spondylosis without myelopathy or radiculopathy, lumbar region: Secondary | ICD-10-CM | POA: Diagnosis not present

## 2020-10-04 DIAGNOSIS — I700 Atherosclerosis of aorta: Secondary | ICD-10-CM | POA: Insufficient documentation

## 2020-10-04 DIAGNOSIS — E785 Hyperlipidemia, unspecified: Secondary | ICD-10-CM | POA: Insufficient documentation

## 2020-10-04 DIAGNOSIS — Z5112 Encounter for antineoplastic immunotherapy: Secondary | ICD-10-CM | POA: Diagnosis not present

## 2020-10-04 DIAGNOSIS — C679 Malignant neoplasm of bladder, unspecified: Secondary | ICD-10-CM | POA: Diagnosis not present

## 2020-10-04 DIAGNOSIS — Z79899 Other long term (current) drug therapy: Secondary | ICD-10-CM | POA: Insufficient documentation

## 2020-10-04 DIAGNOSIS — Z87891 Personal history of nicotine dependence: Secondary | ICD-10-CM | POA: Insufficient documentation

## 2020-10-04 DIAGNOSIS — K435 Parastomal hernia without obstruction or  gangrene: Secondary | ICD-10-CM | POA: Insufficient documentation

## 2020-10-04 DIAGNOSIS — R739 Hyperglycemia, unspecified: Secondary | ICD-10-CM

## 2020-10-04 DIAGNOSIS — D509 Iron deficiency anemia, unspecified: Secondary | ICD-10-CM | POA: Diagnosis not present

## 2020-10-04 DIAGNOSIS — J439 Emphysema, unspecified: Secondary | ICD-10-CM | POA: Insufficient documentation

## 2020-10-04 DIAGNOSIS — E119 Type 2 diabetes mellitus without complications: Secondary | ICD-10-CM | POA: Insufficient documentation

## 2020-10-04 LAB — COMPREHENSIVE METABOLIC PANEL
ALT: 11 U/L (ref 0–44)
AST: 22 U/L (ref 15–41)
Albumin: 4.1 g/dL (ref 3.5–5.0)
Alkaline Phosphatase: 103 U/L (ref 38–126)
Anion gap: 13 (ref 5–15)
BUN: 34 mg/dL — ABNORMAL HIGH (ref 8–23)
CO2: 20 mmol/L — ABNORMAL LOW (ref 22–32)
Calcium: 9 mg/dL (ref 8.9–10.3)
Chloride: 107 mmol/L (ref 98–111)
Creatinine, Ser: 1.8 mg/dL — ABNORMAL HIGH (ref 0.44–1.00)
GFR, Estimated: 30 mL/min — ABNORMAL LOW (ref >60)
Glucose, Bld: 179 mg/dL — ABNORMAL HIGH (ref 70–99)
Potassium: 4.4 mmol/L (ref 3.5–5.1)
Sodium: 140 mmol/L (ref 135–145)
Total Bilirubin: 0.4 mg/dL (ref 0.3–1.2)
Total Protein: 8 g/dL (ref 6.5–8.1)

## 2020-10-04 LAB — CBC WITH DIFFERENTIAL/PLATELET
Abs Immature Granulocytes: 0.03 10*3/uL (ref 0.00–0.07)
Basophils Absolute: 0 10*3/uL (ref 0.0–0.1)
Basophils Relative: 0 %
Eosinophils Absolute: 0.2 10*3/uL (ref 0.0–0.5)
Eosinophils Relative: 3 %
HCT: 30.2 % — ABNORMAL LOW (ref 36.0–46.0)
Hemoglobin: 9.3 g/dL — ABNORMAL LOW (ref 12.0–15.0)
Immature Granulocytes: 1 %
Lymphocytes Relative: 27 %
Lymphs Abs: 1.5 10*3/uL (ref 0.7–4.0)
MCH: 24 pg — ABNORMAL LOW (ref 26.0–34.0)
MCHC: 30.8 g/dL (ref 30.0–36.0)
MCV: 77.8 fL — ABNORMAL LOW (ref 80.0–100.0)
Monocytes Absolute: 0.4 10*3/uL (ref 0.1–1.0)
Monocytes Relative: 7 %
Neutro Abs: 3.4 10*3/uL (ref 1.7–7.7)
Neutrophils Relative %: 62 %
Platelets: 202 10*3/uL (ref 150–400)
RBC: 3.88 MIL/uL (ref 3.87–5.11)
RDW: 14.3 % (ref 11.5–15.5)
WBC: 5.5 10*3/uL (ref 4.0–10.5)
nRBC: 0 % (ref 0.0–0.2)

## 2020-10-04 LAB — TSH: TSH: 1.123 u[IU]/mL (ref 0.350–4.500)

## 2020-10-04 IMAGING — CT CT CHEST W/O CM
2 of 4 series · 15 of 36 positions shown, 18 images · non-contrast
Comparison: Chest radiographs dated 07/16/2018

CLINICAL DATA: Newly diagnosed bladder cancer

EXAM:
CT CHEST WITHOUT CONTRAST
TECHNIQUE: Multidetector CT imaging of the chest was performed following the
standard protocol without IV contrast.

[Series 2: chest · axial · 0.66mm/px · z∈[-1260,-996]mm · 12 of 156 slices shown, 15 images (1 of 2)]
[im 12/156  mediastinal]
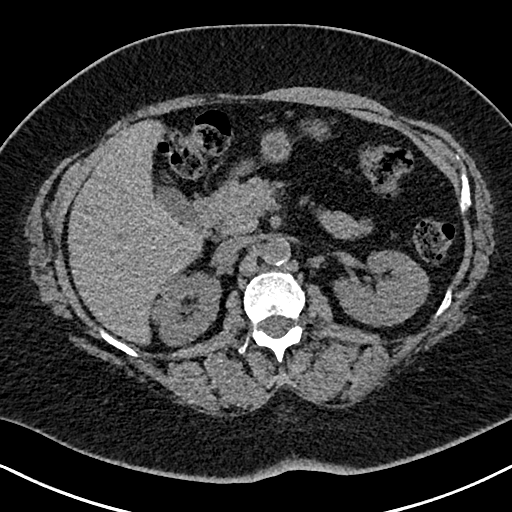
[im 12/156  lung]
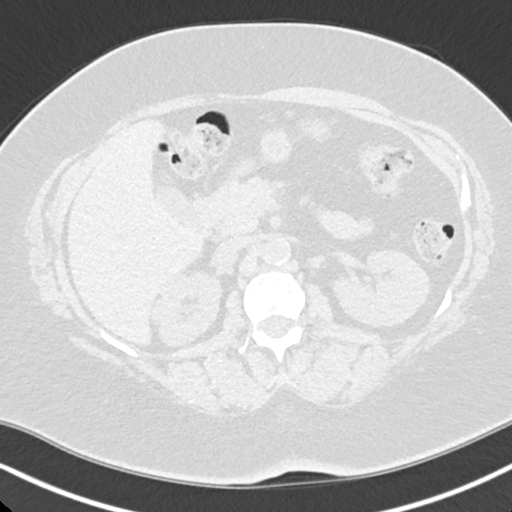
[im 24/156  lung]
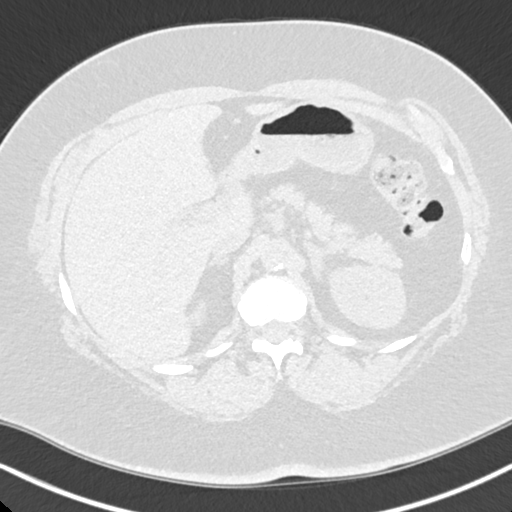
[im 36/156  lung]
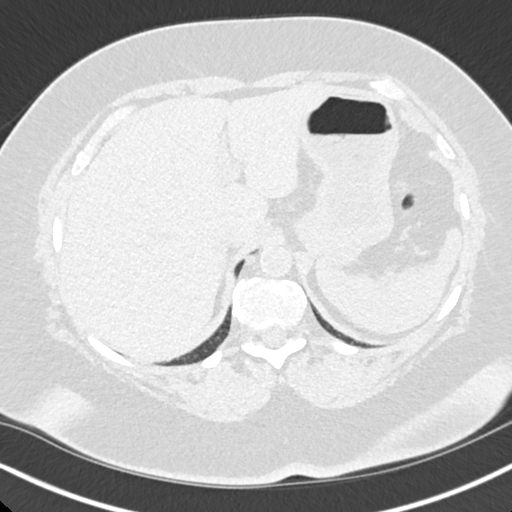
[im 48/156  lung]
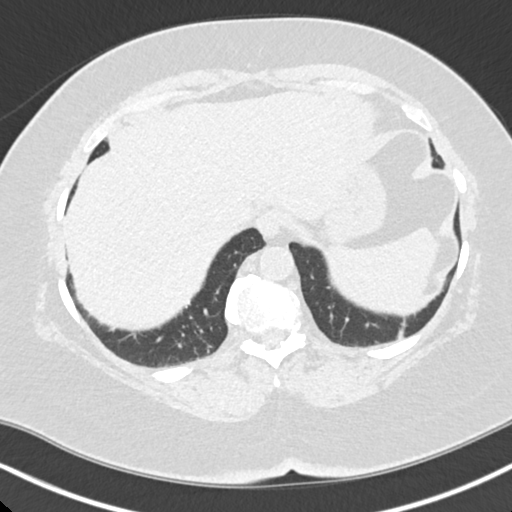
[im 60/156  mediastinal]
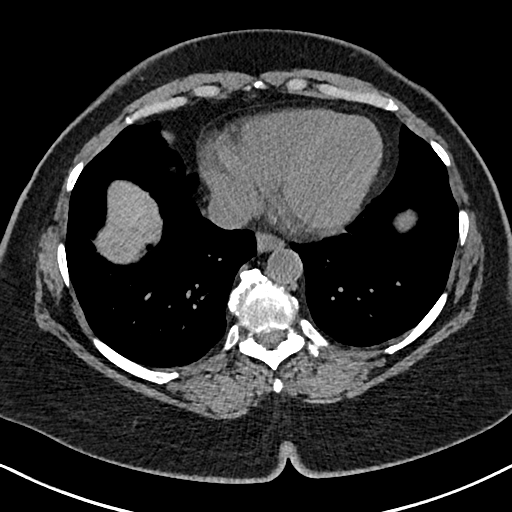
[im 60/156  lung]
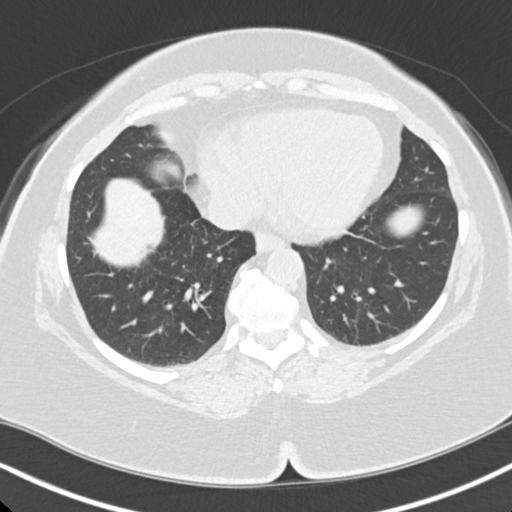
[im 72/156  lung]
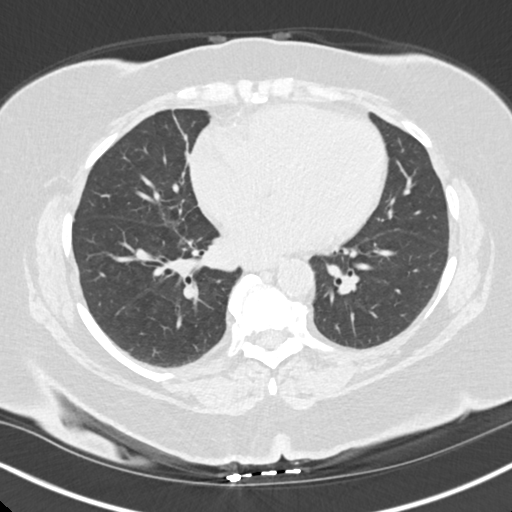
[im 84/156  lung]
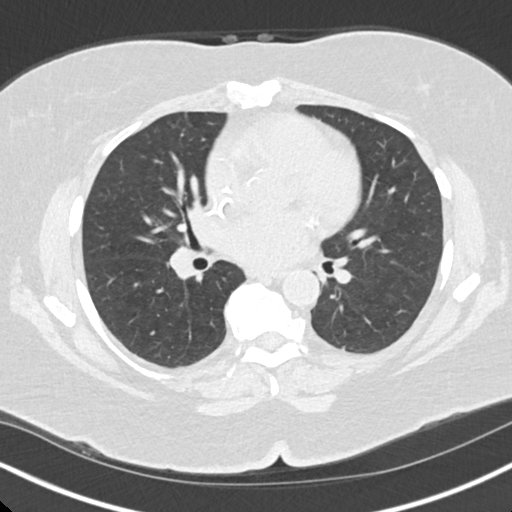
[im 96/156  lung]
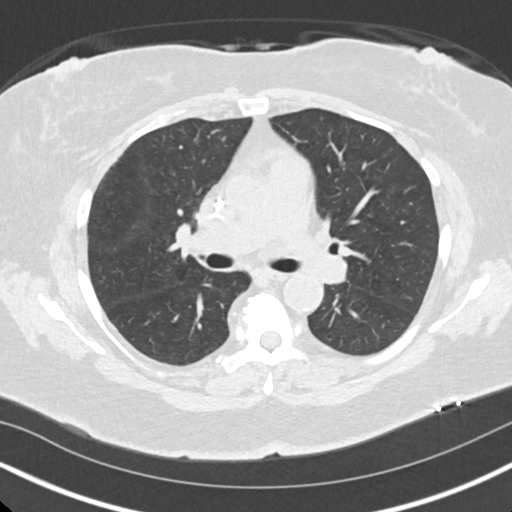
[im 108/156  mediastinal]
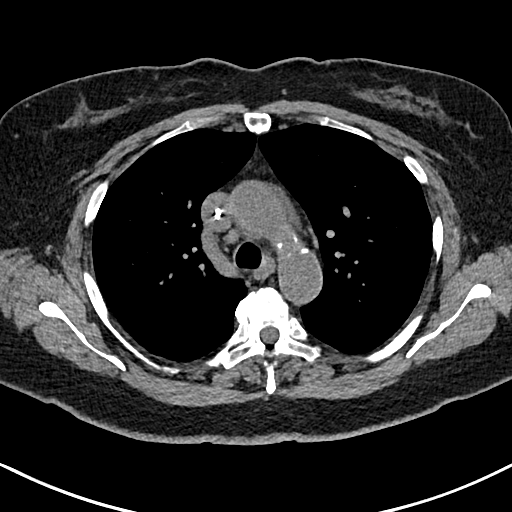
[im 108/156  lung]
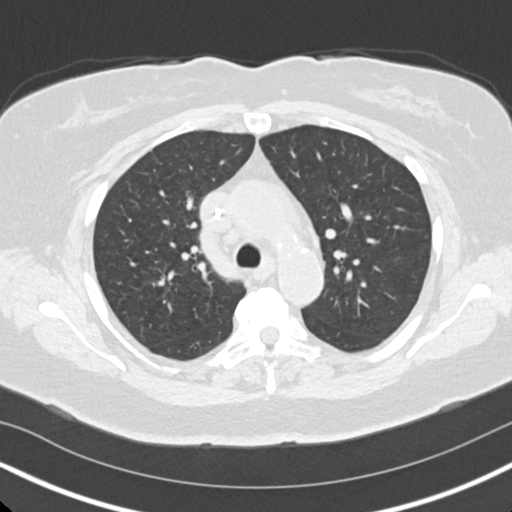
[im 120/156  lung]
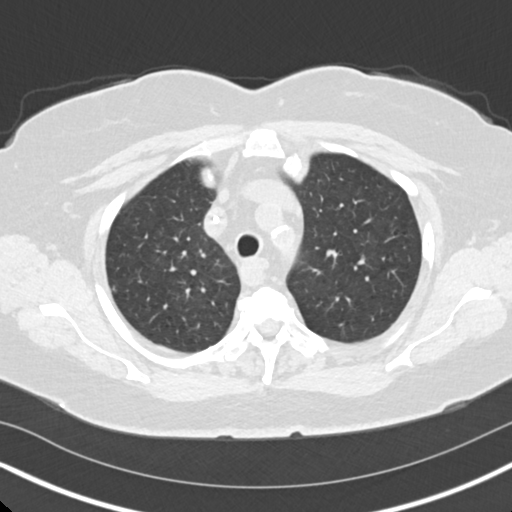
[im 132/156  lung]
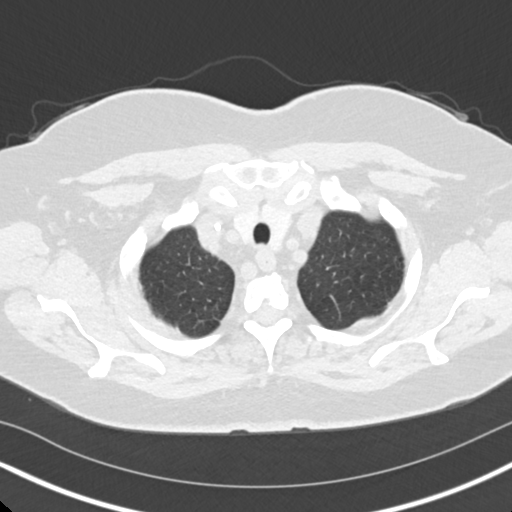
[im 144/156  lung]
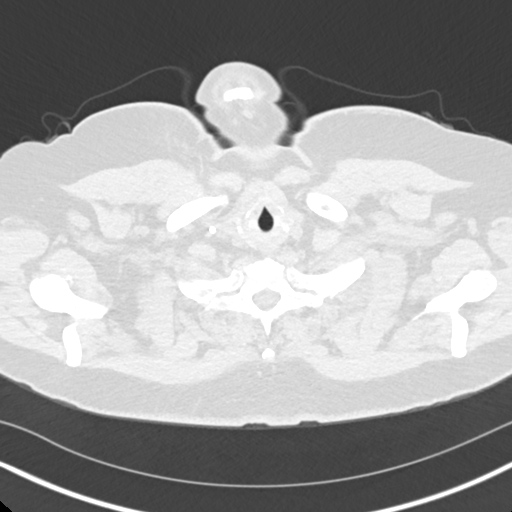

[Series 5: chest · coronal · 0.61mm/px · 3 of 154 slices shown (2 of 2)]
[im 31/154  lung]
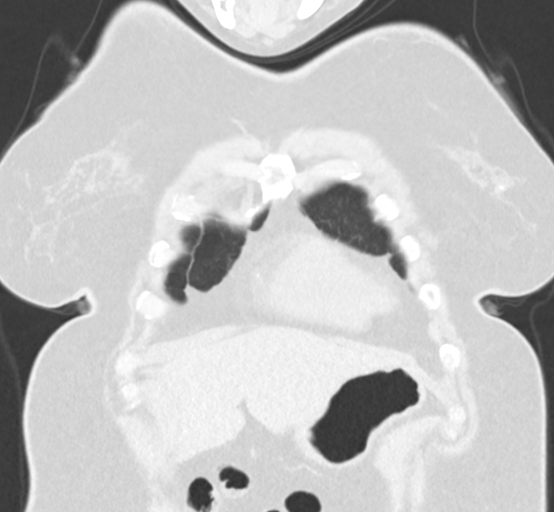
[im 62/154  lung]
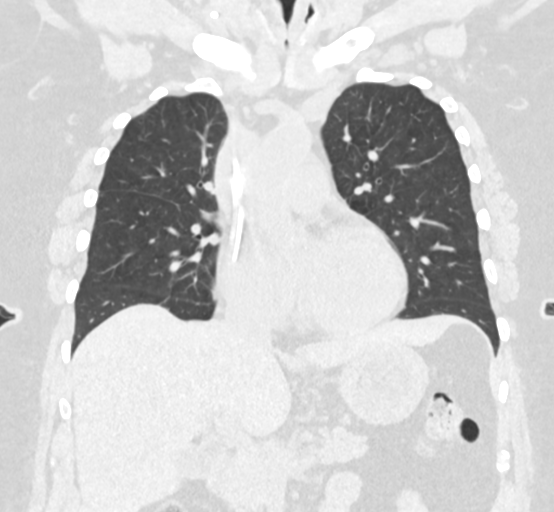
[im 92/154  lung]
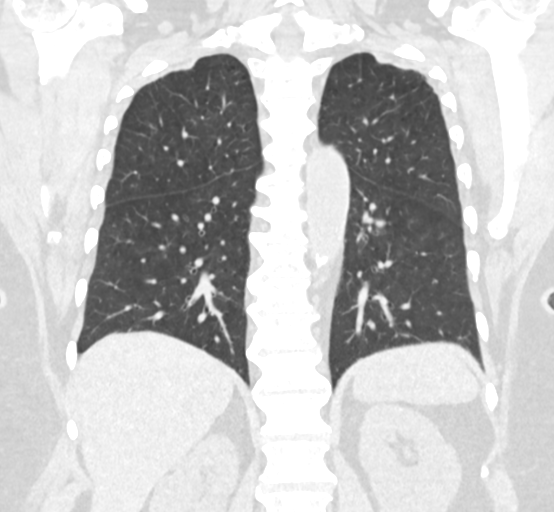

[15 of 36 positions shown; findings below may reference images not displayed]

FINDINGS: Cardiovascular: Heart is normal in size.  No pericardial effusion.

No evidence of thoracic aortic aneurysm. Mild atherosclerotic
calcifications of the aortic arch. Aberrant right subclavian artery.

Mild three-vessel coronary atherosclerosis.

Right chest port terminates at the cavoatrial junction.

Mediastinum/Nodes: No suspicious mediastinal lymphadenopathy.

Visualized thyroid is unremarkable.

Lungs/Pleura: 2 mm subpleural nodule in the lateral right upper lobe
(series 3/image 37). 3 mm subpleural nodule in the right upper lobe
along the minor fissure (series 3/image 58).

No suspicious pulmonary nodules.

Faint ground-glass opacity/mosaic attenuation in the lungs
bilaterally, upper lobe predominant.

Linear scarring medially in the right middle lobe. Mild linear
scarring in the left lower lobe and lingula.

No focal consolidation.

No pleural effusion or pneumothorax.

Upper Abdomen: Visualized upper abdomen is grossly unremarkable,
noting vascular calcifications.

Musculoskeletal: Degenerative changes of the visualized
thoracolumbar spine.
IMPRESSION: No findings suspicious for metastatic disease in the chest.

Two subpleural right upper lobe nodules measuring 2-3 mm, likely
benign. Consider follow-up CT chest in 6 months. Please note that
[HOSPITAL] guidelines do not apply in the setting of known
malignancy.

Aortic Atherosclerosis ([id number]-D3K.K).

## 2020-10-04 MED ORDER — SODIUM CHLORIDE 0.9 % IV SOLN
Freq: Once | INTRAVENOUS | Status: AC
  Filled 2020-10-04: qty 250

## 2020-10-04 MED ORDER — HEPARIN SOD (PORK) LOCK FLUSH 100 UNIT/ML IV SOLN
500.0000 [IU] | Freq: Once | INTRAVENOUS | Status: AC | PRN
  Administered 2020-10-04: 500 [IU]
  Filled 2020-10-04: qty 5

## 2020-10-04 MED ORDER — SODIUM CHLORIDE 0.9 % IV SOLN
200.0000 mg | Freq: Once | INTRAVENOUS | Status: AC
  Administered 2020-10-04: 200 mg via INTRAVENOUS
  Filled 2020-10-04: qty 8

## 2020-10-04 MED ORDER — HEPARIN SOD (PORK) LOCK FLUSH 100 UNIT/ML IV SOLN
INTRAVENOUS | Status: AC
  Filled 2020-10-04: qty 5

## 2020-10-04 NOTE — Telephone Encounter (Signed)
Please advise 

## 2020-10-04 NOTE — Progress Notes (Signed)
Patient has a slight increase in fatigue.

## 2020-10-04 NOTE — Patient Instructions (Signed)
[facility]    Discharge Instructions: Thank you for choosing [facility] to provide your oncology and hematology care.  If you have a lab appointment with the Cancer Center, please go directly to the Cancer Center and check in at the registration area.  Wear comfortable clothing and clothing appropriate for easy access to any Portacath or PICC line.   We strive to give you quality time with your provider. You may need to reschedule your appointment if you arrive late (15 or more minutes).  Arriving late affects you and other patients whose appointments are after yours.  Also, if you miss three or more appointments without notifying the office, you may be dismissed from the clinic at the provider's discretion.      For prescription refill requests, have your pharmacy contact our office and allow 72 hours for refills to be completed.    Today you received the following chemotherapy and/or immunotherapy agents: Pembrolizumab (Keytruda).      To help prevent nausea and vomiting after your treatment, we encourage you to take your nausea medication as directed.  BELOW ARE SYMPTOMS THAT SHOULD BE REPORTED IMMEDIATELY: . *FEVER GREATER THAN 100.4 F (38 C) OR HIGHER . *CHILLS OR SWEATING . *NAUSEA AND VOMITING THAT IS NOT CONTROLLED WITH YOUR NAUSEA MEDICATION . *UNUSUAL SHORTNESS OF BREATH . *UNUSUAL BRUISING OR BLEEDING . *URINARY PROBLEMS (pain or burning when urinating, or frequent urination) . *BOWEL PROBLEMS (unusual diarrhea, constipation, pain near the anus) . TENDERNESS IN MOUTH AND THROAT WITH OR WITHOUT PRESENCE OF ULCERS (sore throat, sores in mouth, or a toothache) . UNUSUAL RASH, SWELLING OR PAIN  . UNUSUAL VAGINAL DISCHARGE OR ITCHING   Items with * indicate a potential emergency and should be followed up as soon as possible or go to the Emergency Department if any problems should occur.  Please show the CHEMOTHERAPY ALERT CARD  or IMMUNOTHERAPY ALERT CARD at check-in to the Emergency Department and triage nurse.  Should you have questions after your visit or need to cancel or reschedule your appointment, please contact [facility]  [phone] and follow the prompts.  Office hours are 8:00 a.m. to 4:30 p.m. Monday - Friday. Please note that voicemails left after 4:00 p.m. may not be returned until the following business day.  We are closed weekends and major holidays. You have access to a nurse at all times for urgent questions. Please call the main number to the clinic [phone] and follow the prompts.  For any non-urgent questions, you may also contact your provider using MyChart. We now offer e-Visits for anyone 18 and older to request care online for non-urgent symptoms. For details visit [email or web address].   Also download the MyChart app! Go to the app store, search "MyChart", open the app, select [facility], and log in with your MyChart username and password.  Due to Covid, a mask is required upon entering the hospital/clinic. If you do not have a mask, one will be given to you upon arrival. For doctor visits, patients may have 1 support person aged 18 or older with them. For treatment visits, patients cannot have anyone with them due to current Covid guidelines and our immunocompromised population.   Pembrolizumab injection What is this medicine? PEMBROLIZUMAB (pem broe liz ue mab) is a monoclonal antibody. It is used to treat certain types of cancer. This medicine may be used for other purposes; ask your health care provider or pharmacist if you   have questions. COMMON BRAND NAME(S): Keytruda What should I tell my health care provider before I take this medicine? They need to know if you have any of these conditions:  autoimmune diseases like Crohn's disease, ulcerative colitis, or lupus  have had or planning to have an allogeneic stem cell transplant (uses someone  else's stem cells)  history of organ transplant  history of chest radiation  nervous system problems like myasthenia gravis or Guillain-Barre syndrome  an unusual or allergic reaction to pembrolizumab, other medicines, foods, dyes, or preservatives  pregnant or trying to get pregnant  breast-feeding How should I use this medicine? This medicine is for infusion into a vein. It is given by a health care professional in a hospital or clinic setting. A special MedGuide will be given to you before each treatment. Be sure to read this information carefully each time. Talk to your pediatrician regarding the use of this medicine in children. While this drug may be prescribed for children as young as 6 months for selected conditions, precautions do apply. Overdosage: If you think you have taken too much of this medicine contact a poison control center or emergency room at once. NOTE: This medicine is only for you. Do not share this medicine with others. What if I miss a dose? It is important not to miss your dose. Call your doctor or health care professional if you are unable to keep an appointment. What may interact with this medicine? Interactions have not been studied. This list may not describe all possible interactions. Give your health care provider a list of all the medicines, herbs, non-prescription drugs, or dietary supplements you use. Also tell them if you smoke, drink alcohol, or use illegal drugs. Some items may interact with your medicine. What should I watch for while using this medicine? Your condition will be monitored carefully while you are receiving this medicine. You may need blood work done while you are taking this medicine. Do not become pregnant while taking this medicine or for 4 months after stopping it. Women should inform their doctor if they wish to become pregnant or think they might be pregnant. There is a potential for serious side effects to an unborn child. Talk  to your health care professional or pharmacist for more information. Do not breast-feed an infant while taking this medicine or for 4 months after the last dose. What side effects may I notice from receiving this medicine? Side effects that you should report to your doctor or health care professional as soon as possible:  allergic reactions like skin rash, itching or hives, swelling of the face, lips, or tongue  bloody or black, tarry  breathing problems  changes in vision  chest pain  chills  confusion  constipation  cough  diarrhea  dizziness or feeling faint or lightheaded  fast or irregular heartbeat  fever  flushing  joint pain  low blood counts - this medicine may decrease the number of white blood cells, red blood cells and platelets. You may be at increased risk for infections and bleeding.  muscle pain  muscle weakness  pain, tingling, numbness in the hands or feet  persistent headache  redness, blistering, peeling or loosening of the skin, including inside the mouth  signs and symptoms of high blood sugar such as dizziness; dry mouth; dry skin; fruity breath; nausea; stomach pain; increased hunger or thirst; increased urination  signs and symptoms of kidney injury like trouble passing urine or change in the amount of urine    signs and symptoms of liver injury like dark urine, light-colored stools, loss of appetite, nausea, right upper belly pain, yellowing of the eyes or skin  sweating  swollen lymph nodes  weight loss Side effects that usually do not require medical attention (report to your doctor or health care professional if they continue or are bothersome):  decreased appetite  hair loss  tiredness This list may not describe all possible side effects. Call your doctor for medical advice about side effects. You may report side effects to FDA at 1-800-FDA-1088. Where should I keep my medicine? This drug is given in a hospital or clinic  and will not be stored at home. NOTE: This sheet is a summary. It may not cover all possible information. If you have questions about this medicine, talk to your doctor, pharmacist, or health care provider.  2021 [employer] Standard (2019-04-22 21:44:53)  

## 2020-10-04 NOTE — Progress Notes (Signed)
Hematology/Oncology follow up note [facility] [phone] [phone]   Patient Care Team: [name], NP as PCP - General Nice, [name], OD (Optometry) [name], MD as Consulting Physician (Hematology and Oncology) [name], [facility] (Pharmacist) Vanita Ingles, RN as Registered Nurse (General Practice)  REFERRING PROVIDER: Dr.Sninsky CHIEF COMPLAINTS/REASON FOR VISIT:  Follow up for bladder cancer, anemia.   HISTORY OF PRESENTING ILLNESS:  Angela Russell is a  70 y.o.  female with PMH listed below who was referred to me for evaluation of newly diagnosed bladder cancer. Patient initially presented to emergency room at the end of January 2020 for evaluation of dysuria, hematuria and the left lower quadrant inguinal pain and flank pain.  1/28 2020 CT renal stone study showed suspected irregular wall thickening about the superior bladder, not well assessed due to degree of bladder distention.  Recommend cystoscopy for further evaluation.  No renal stone or obstructive uropathy.  Patient was given IV Rocephin and referred patient for outpatient urology follow-up.  Urine culture was negative.  She again presented to ER after 2 days with similar symptoms.  Patient has 25-pack-year smoking history, quit approximately 20 years ago.  No family history of any urology malignancies 07/03/2018 another CT abdomen pelvis with contrast was done which showed no nephrolithiasis or hydronephrosis is identified.  Bladder is decompressed limiting evaluation.  07/16/2018.urology Dr. [name] - cystoscopy and bilateral retrograde pyelogram on 07/16/2018.  Pyelogram did not show any filling defect or abnormalities.  No hydronephrosis.  Ureteral orifice was not involved with tumor.  There is a large 5 cm posterior wall bladder tumor, bullous and sessile appearing.  Patient underwent TURBT.   Pathology: High-grade urothelial carcinoma, invasive into muscularis  propria.  Lymphovascular invasion is present.  Carcinoma in situ is also identified.  Focal squamous differentiation is noted, areas of invasive carcinoma display pleomorphic/sarcomatoid changes.  T2b  08/07/2018 CT without contrast negative.  2 subpleural right upper lobe nodule 2 to 3 mm likely benign. She also had baseline audiometry done.  08/04/2018 ddMVAC x 1 cycle, stopped due to intolerance and AKI.  Patient received 1 cycle of dd MVAC, not able to tolerate due to AKI. Patient then was referred to [facility] urology  09/22/2018 patient underwent a cystectomy, pathology pT3a N0 Mx.  11 lymph nodes were harvested and was all negative. Invasive urothelia carcinoma, high grade, with sarcomatoid features.   10/14/2018 patient was admitted due to pyelonephritis and a pelvic fluid collection.  Drain was placed. 10/23/2018 drain was removed. Patient has had difficulties getting to her appointments to [facility]. 02/09/2019, patient presented with abdominal pain. CT concerning for small bowel obstruction and increased size of right pelvic fluid collection concerning for cancer recurrence.  Right hydronephrosis to the level of pelvis and enlarged lymph nodes. Patient had JP drain placed with CT guidance to pelvic fluid collection and drained 400 cc amber fluid.  Culture was negative for growth of microorganisms and cytology was negative for malignancy.-JP drain was removed on the day of discharge. CT-guided core biopsy of pelvic lymph node adenopathy was attempted but not successful.  # Right lower extremity DVT, provoked by Transvenous biopsy  02/26/2019 transvenous biopsy by IR unsuccessful transvenous biopsy of the right pelvis mass. Post biopsy acute thrombus in the right external iliac and common femoral vein.  Patient was recommended to start anticoagulation with Xarelto however due to the co-pay, patient is not able to afford the medication. Anticoagulation regimen was switched to  Eliquis 5 mg.   Patient reports that she has been taking it once a day. 03/20/2019 PET showed FDG avid tissue in the cystectomy bed, retroperitoneal and pelvic lymphadenopathy.  Right common iliac DVT.  # increased right lower extremity swelling.  She was started on Eliquis for anticoagulation by [facility].  We clarified with her pharmacy and she was actually taking Eliquis 2.5 mg twice daily. Right lower extremity swelling has not improved but instead worsened. 03/16/2019 She had ultrasound right lower extremity done which showed persistent extensive proximal right lower extremity DVT.  Anticoagulation regimen has increased to Eliquis 5 mg twice daily.  # establish care with [facility] oncology Dr. [name] for evaluation.  Dr. [name] recommended starting immunotherapy with PD-L1 inhibitor Pembrolizumab 200 mg every 3 weeks.  The sarcomatoid histology may not respond to chemotherapy well, could portend a better chance of response into immunotherapy PET scan after 4 cycles of Keytruda showed single mildly enlarged central mesenteric lymph node in the upper pelvis with SUV 9.6.  No other abnormal hypermetabolic activity was reported.  # #Right lower extremity  femoral vein DVT provoked by transvenous biopsy in the context of cancer recurrence, Status post IV C filter and mechanical thrombectomy.  IVC filter has been removed. She is currently off anticoagulation after she developed hematuria.  # 03/30/2019 Status post IVC filter placement, mechanical thrombectomy.-IVC filter was retrieved in January 2021. # PD-L1 CPS 100%.  # 03/31/2019 started on immunotherapy Keytruda.  # 04/26/2020 CT chest abdomen pelvis was reviewed and discussed with patient. No definitive finding of disease recurrence or metastasis.  Small amount of ascites similar to prior. There is a parastomal hernia containing a small margin of adjacent small bowel.  This is associated with wall thickening in approximately 7 cm segment of bowel adjacent to the  hernia.  There is adjacent abnormal stranding of the mesentery and omentum.  # 08/19/2020, CT chest abdomen pelvis showed no evidence of new/progressive metastatic disease. NED Hold off treatment due to uncontrolled hypotension.  # Chronic abdominal discomfort due to hernia. 08/11/2020, patient was seen by Dr. Dolphus Jenny for parastomal hernia.  Patient has no obstructive symptoms and would like to continue to monitor her symptoms and hold off any intervention at this point.  Patient was recommended to wear ostomy hernia belt and was referred to [facility] wound ostomy nursing team.   Angela Russell is a 70 y.o. female who has above history reviewed by me today presents for follow up visit for evaluation form metastatic high-grade urothelial carcinoma of the bladder. Sarcomatoid feature.  Patient has been on maintenance immunotherapy. Overall she tolerates immunotherapy.  Has been on Keytruda every 4 weeks per patient's request Her primary care provider recommended semaglutide for diabetic control.  Patient reports that she is concerned about side effects and she does not plan to take it.  She has worked on her diet lately.  Review of Systems  Constitutional: Negative for appetite change, chills, fatigue and fever.  HENT:   Negative for hearing loss and voice change.   Eyes: Negative for eye problems.  Respiratory: Negative for chest tightness and cough.   Cardiovascular: Negative for chest pain and leg swelling.  Gastrointestinal: Negative for abdominal distention, abdominal pain and blood in stool.  Endocrine: Negative for hot flashes.  Genitourinary: Negative for difficulty urinating and frequency.   Musculoskeletal: Negative for arthralgias.  Skin: Negative for itching and rash.  Neurological: Positive for numbness. Negative for extremity weakness.  Hematological: Negative for adenopathy.  Psychiatric/Behavioral: Negative for confusion. The patient is not nervous/anxious.      MEDICAL HISTORY:  Past Medical History:  Diagnosis Date  . Carotid artery plaque, right 01/2014  . CKD (chronic kidney disease)    stage 2-3  . Controlled diabetes mellitus type 2 with complications ([place])   . Diabetes mellitus without complication ([place])   . DM (diabetes mellitus), type 2, uncontrolled ([place])   . Hyperlipidemia   . Hypertension   . Hypochromic microcytic anemia    mild  . Metastatic urothelial carcinoma ([place]) 03/23/2019  . Osteoporosis   . Renal insufficiency   . Rotator cuff tendonitis, right     SURGICAL HISTORY: Past Surgical History:  Procedure Laterality Date  . ABDOMINAL HYSTERECTOMY  2000   due to bleeding and fibroids, partial- still has ovaries  . CYSTOSCOPY W/ RETROGRADES Bilateral 07/16/2018   Procedure: CYSTOSCOPY WITH RETROGRADE PYELOGRAM;  Surgeon: Billey Co, MD;  Location: [facility] ORS;  Service: Urology;  Laterality: Bilateral;  . IVC FILTER INSERTION N/A 03/30/2019   Procedure: IVC FILTER INSERTION;  Surgeon: Algernon Huxley, MD;  Location: [place] [facility];  Service: Cardiovascular;  Laterality: N/A;  . IVC FILTER REMOVAL N/A 06/15/2019   Procedure: IVC FILTER REMOVAL;  Surgeon: Algernon Huxley, MD;  Location: [place] [facility];  Service: Cardiovascular;  Laterality: N/A;  . KNEE SURGERY Left 03/17/2013   torn meniscus  . PERIPHERAL VASCULAR THROMBECTOMY Right 03/30/2019   Procedure: PERIPHERAL VASCULAR THROMBECTOMY;  Surgeon: Algernon Huxley, MD;  Location: [place] [facility];  Service: Cardiovascular;  Laterality: Right;  . PORTA CATH INSERTION N/A 08/06/2018   Procedure: PORTA CATH INSERTION;  Surgeon: Algernon Huxley, MD;  Location: [place] [facility];  Service: Cardiovascular;  Laterality: N/A;  . TRANSURETHRAL RESECTION OF BLADDER TUMOR N/A 07/16/2018   Procedure: TRANSURETHRAL RESECTION OF BLADDER TUMOR (TURBT);  Surgeon: Billey Co, MD;  Location: [facility] ORS;  Service: Urology;  Laterality: N/A;    SOCIAL HISTORY: Social History    Socioeconomic History  . Marital status: Single    Spouse name: Not on file  . Number of children: 1  . Years of education: Not on file  . Highest education level: 10th grade  Occupational History  . Not on file  Tobacco Use  . Smoking status: Former Smoker    Quit date: 06/05/1991    Years since quitting: 29.3  . Smokeless tobacco: Never Used  Vaping Use  . Vaping Use: Never used  Substance and Sexual Activity  . Alcohol use: No  . Drug use: No  . Sexual activity: Not Currently  Other Topics Concern  . Not on file  Social History Narrative   Working full time   Social Determinants of Radio broadcast assistant Strain: Low Risk   . Difficulty of Paying Living Expenses: Not hard at all  Food Insecurity: Not on file  Transportation Needs: Not on file  Physical Activity: Not on file  Stress: Not on file  Social Connections: Not on file  Intimate Partner Violence: Not on file    FAMILY HISTORY: Family History  Problem Relation Age of Onset  . Heart disease Mother   . Heart attack Mother   . Arthritis Father   . Diabetes Brother     ALLERGIES:  has No Known Allergies.  MEDICATIONS:  Current Outpatient Medications  Medication Sig Dispense Refill  . amLODipine (NORVASC) 5 MG tablet Take 1 tablet (5 mg total) by mouth daily. [place]  tablet 1  . Continuous Blood Gluc Receiver (FREESTYLE LIBRE 2 READER) DEVI 1 applicator by Does not apply route daily. 1 each 0  . Continuous Blood Gluc Sensor (FREESTYLE LIBRE 2 SENSOR) MISC 1 applicator by Does not apply route daily. 4 each 1  . diclofenac sodium (VOLTAREN) 1 % GEL Apply 2 g topically 4 (four) times daily. (Patient taking differently: Apply 2 g topically as needed.) 100 g 2  . docusate sodium (COLACE) 100 MG capsule Take 1 capsule (100 mg total) by mouth daily. 90 capsule 0  . ferrous sulfate 325 (65 FE) MG EC tablet Take 325 mg by mouth daily with breakfast. OTC    . lidocaine-prilocaine (EMLA) cream Apply 1 application  topically as needed. 30 g 3  . losartan (COZAAR) 25 MG tablet Take 1 tablet (25 mg total) by mouth daily. 90 tablet 0  . pembrolizumab (KEYTRUDA) 100 MG/4ML SOLN Inject 200 mg into the vein. Every 42 days,    Last txy 4/05, Next 5/03    . polyethylene glycol (MIRALAX / GLYCOLAX) 17 g packet Take 17 g by mouth daily as needed for mild constipation. 14 each 0  . rosuvastatin (CRESTOR) 40 MG tablet Take 1 tablet (40 mg total) by mouth daily. 90 tablet 1  . Semaglutide (RYBELSUS) 3 MG TABS Take 3 mg by mouth daily. (Patient not taking: Reported on 10/04/2020) 30 tablet 0   No current facility-administered medications for this visit.     PHYSICAL EXAMINATION: ECOG PERFORMANCE STATUS: 1 - Symptomatic but completely ambulatory Vitals:   10/04/20 0849  BP: 137/73  Pulse: 92  Resp: 16  Temp: (!) 97.1 F (36.2 C)   Filed Weights   10/04/20 0849  Weight: 207 lb 11.2 oz (94.2 kg)    Physical Exam Constitutional:      General: She is not in acute distress.    Comments: Walk independently  HENT:     Head: Normocephalic and atraumatic.  Eyes:     General: No scleral icterus.    Pupils: Pupils are equal, round, and reactive to light.  Cardiovascular:     Rate and Rhythm: Normal rate and regular rhythm.     Heart sounds: Normal heart sounds.  Pulmonary:     Effort: Pulmonary effort is normal. No respiratory distress.     Breath sounds: No wheezing.  Abdominal:     General: Bowel sounds are normal. There is no distension.     Palpations: Abdomen is soft. There is no mass.     Comments: Ureterostomy,   Musculoskeletal:        General: No deformity. Normal range of motion.     Cervical back: Normal range of motion and neck supple.  Skin:    General: Skin is warm and dry.     Coloration: Skin is not pale.     Findings: No erythema or rash.  Neurological:     Mental Status: She is alert and oriented to person, place, and time. Mental status is at baseline.     Cranial Nerves: No cranial  nerve deficit.     Coordination: Coordination normal.  Psychiatric:        Mood and Affect: Mood normal.   .  RADIOGRAPHIC STUDIES: I have personally reviewed the radiological images as listed and agreed with the findings in the report.  CMP Latest Ref Rng & Units 10/04/2020  Glucose 70 - 99 mg/dL 179(H)  BUN 8 - 23 mg/dL 34(H)  Creatinine 0.44 - 1.00 mg/dL  1.80(H)  Sodium 135 - 145 mmol/L 140  Potassium 3.5 - 5.1 mmol/L 4.4  Chloride 98 - 111 mmol/L 107  CO2 22 - 32 mmol/L 20(L)  Calcium 8.9 - 10.3 mg/dL 9.0  Total Protein 6.5 - 8.1 g/dL 8.0  Total Bilirubin 0.3 - 1.2 mg/dL 0.4  Alkaline Phos 38 - 126 U/L 103  AST 15 - 41 U/L 22  ALT 0 - 44 U/L 11   CBC Latest Ref Rng & Units 10/04/2020  WBC 4.0 - 10.5 K/uL 5.5  Hemoglobin 12.0 - 15.0 g/dL 9.3(L)  Hematocrit 36.0 - 46.0 % 30.2(L)  Platelets 150 - 400 K/uL 202    LABORATORY DATA:  I have reviewed the data as listed Lab Results  Component Value Date   WBC 5.5 10/04/2020   HGB 9.3 (L) 10/04/2020   HCT 30.2 (L) 10/04/2020   MCV 77.8 (L) 10/04/2020   PLT 202 10/04/2020   Recent Labs    02/09/20 0832 02/16/20 1307 03/01/20 1301 03/22/20 0846 08/30/20 0843 09/06/20 0830 09/23/20 0806 10/04/20 0835  NA 138 139 140   < > 138 137 140 140  K 4.4 4.6 4.6   < > 4.0 4.3 4.6 4.4  CL 104 107 108   < > 106 104 104 107  CO2 21* 20* 22   < > 20* 21* 20 20*  GLUCOSE 228* 163* 190*   < > 192* 211* 171* 179*  BUN 41* 49* 33*   < > 34* 32* 38* 34*  CREATININE 2.35* 2.41* 2.08*   < > 1.63* 1.83* 2.10* 1.80*  CALCIUM 9.0 9.3 9.3   < > 9.0 9.0 9.7 9.0  GFRNONAA 20* 20* 24*   < > 34* 29*  --  30*  GFRAA 24* 23* 27*  --   --   --   --   --   PROT 7.9 8.6* 7.8   < > 7.8 7.7 7.6 8.0  ALBUMIN 4.3 4.5 4.3   < > 4.2 4.1 4.7 4.1  AST _0 < > _1 ALT _2 < > _3 ALKPHOS 100 86 87   < > 95 105 139* 103  BILITOT 0.6 0.5 0.5   < > 0.5 0.5 0.2 0.4   < > = values in this interval not displayed.    Iron/TIBC/Ferritin/ %Sat    Component Value Date/Time   IRON 68 08/02/2020 1254   IRON 26 (L) 02/04/2019 1309   TIBC 312 08/02/2020 1254   TIBC 251 02/04/2019 1309   FERRITIN 197 08/02/2020 1254   FERRITIN 409 (H) 02/04/2019 1309   IRONPCTSAT 22 08/02/2020 1254   IRONPCTSAT 10 (L) 02/04/2019 1309    RADIOGRAPHIC STUDIES: I have personally reviewed the radiological images as listed and agreed with the findings in the report. CT CHEST ABDOMEN PELVIS WO CONTRAST  Result Date: 08/19/2020 CLINICAL DATA:  Restaging metastatic bladder cancer. History of cystectomy and neobladder surgery in April 2020. EXAM: CT CHEST, ABDOMEN AND PELVIS WITHOUT CONTRAST TECHNIQUE: Multidetector CT imaging of the chest, abdomen and pelvis was performed following the standard protocol without IV contrast. COMPARISON:  Multiple priors including most recent CT chest abdomen pelvis January 06, 2020 and PET-CT September 01, 2019 FINDINGS: CT CHEST FINDINGS Cardiovascular: Normal size heart. No significant pericardial effusion/thickening. Coronary artery calcifications. Aortic atherosclerosis. No thoracic aortic aneurysm. Mediastinum/Nodes: Scattered prominent non pathologically enlarged mediastinal and hilar lymph nodes are stable. No  discrete thyroid nodularity. Trachea esophagus appear within normal limits. Lungs/Pleura: Stable emphysematous change in scattered pulmonary scarring. No suspicious pulmonary nodules or masses. No pleural effusion. No pneumothorax. Stable pericardial cyst in the right inferior pulmonary vein. Musculoskeletal: No acute osseous abnormality. No aggressive lytic or blastic lesion of bone. CT ABDOMEN PELVIS FINDINGS Hepatobiliary: Unremarkable noncontrast appearance of the hepatic parenchyma. Gallbladder is unremarkable. No biliary ductal dilation. Pancreas: Unremarkable. Spleen: Unremarkable. Adrenals/Urinary Tract: The adrenal glands are unremarkable. Next. No hydronephrosis. No nephrolithiasis. No  contour deforming renal lesions. Stable postsurgical changes cystectomy with urinary diversion procedure with ileal conduit. Stomach/Bowel: The stomach, duodenum, small bowel and colon are grossly unremarkable. Normal appendix. Terminal ileum is unremarkable. No acute inflammatory changes, mass lesions or obstructive findings. Vascular/Lymphatic: Stable advanced atherosclerotic calcifications of the aorta and branch vessels. Right iliac artery stent is again visualized. No pathologically enlarged mesenteric retroperitoneal or pelvic lymph nodes. A few prominent scattered mesenteric lymph nodes are again noted and appear stable in size. Reproductive: Surgically absent. Other: Small volume pelvic free fluid. Unchanged appearance of the nodularity along the left pelvic sidewall surgical clips. Musculoskeletal: No aggressive lytic or blastic lesion of bone. Moderate lower lumbar degenerative change. Degenerative change of the SI joints and bilateral hips. IMPRESSION: 1. Stable postsurgical changes cystectomy with urinary diversion procedure with ileal conduit. 2. No evidence of new or progressive metastatic disease within the chest, abdomen, or pelvis. 3. Stable advanced atherosclerotic calcifications involving the aorta and branch vessels. 4. Stable emphysematous change and pulmonary scarring. 5. Emphysema and aortic atherosclerosis. Aortic Atherosclerosis (ICD10-I70.0) and Emphysema (ICD10-J43.9). Electronically Signed   By: Dahlia Bailiff MD   On: 08/19/2020 12:03     ASSESSMENT & PLAN:  1. Metastatic urothelial carcinoma ([place])   2. Encounter for antineoplastic immunotherapy   3. Anemia in stage 3b chronic kidney disease ([place])   4. Weight gain   5. Parastomal hernia without obstruction or gangrene   6. Hyperglycemia   Cancer Staging Bladder carcinoma [facility]) Staging form: Urinary Bladder, AJCC 8th Edition - Clinical stage from 08/01/2018: Stage IVA (cTX, cN3, cM1a) - Signed by [name], MD on  04/21/2019  #Metastatic urothelial carcinoma of bladder, sarcomatoid features pelvic sidewall mass biopsy showed metastatic carcinoma consistent with involvement by urothelial carcinoma. Labs are reviewed and discussed with patient. Proceed with Keytruda today.  #Hernia -parastomal hernia.   She has establish care with [facility] hernia center.  She was recommended to monitor her symptoms and aware ostomy belt  #Chronic kidney disease, creatinine has improved to her baseline.  Encourage oral hydration.  Avoid nephrotoxin.  #Chronic anemia secondary to stage III CKD.  Hemoglobin has further decreased to 9.3 today.  We discussed about IV Venofer treatments and she agrees.  Rationale and potential side effects were reviewed with her again.  #History of diabetes. A1c few months ago was 8.4 History of diabetes which got better due to weight loss secondary to and neoplasm related to surgery and chemotherapy treatments.  Now her weight has been back to her presurgery baseline, and she has been persistently hyperglycemic.  I communicated with her primary care provider and we agreed that patient will likely need to be started on diabetic medication.  Current level of hyperglycemia is likely not going to be effective for her current level of hyperglycemia.   I will defer her diabetic care to primary care provider.  I encourage patient to call primary care provider with her concerns about starting the new medications.  #Weight gain.  TSH  has been monitored and has been normal.  She has been referred to establish care with dietitian and no showed.   #Follow-up in 4 weeks for repeat blood work, MD evaluation and Keytruda.  Patient prefers every 4 weeks treatment versus every 3 weeks.  .All questions were answered. The patient knows to call the clinic with any problems questions or concerns.  [name], MD, PhD Hematology Oncology [facility] Pager- [phone] 10/04/20

## 2020-10-04 NOTE — Telephone Encounter (Signed)
Semaglutide (RYBELSUS) 3 MG TABS 30 tablet 0 09/29/2020    Sig - Route: Take 3 mg by mouth daily. - Oral   Patient not taking: Reported on 10/04/2020       Sent to pharmacy as: Semaglutide (RYBELSUS) 3 MG Tab     Disp Refills Start End   amLODipine (NORVASC) 5 MG tablet 90 tablet 1 03/11/2020    Sig - Route: Take 1 tablet (5 mg total) by mouth daily. - Oral   Sent to pharmacy as: amLODipine (NORVASC) 5 MG tablet    Pt is calling in as just prescribed Rybelsus. She is very cautious due to her kidney failure. She was reading about Rybelsus and stated that the article she was reading stated  "do not take with amlodipine" which she does take, she is afraid to take. Pls FU to advise pt. Told pt to wait until she gets a cb to advise. Fu at [phone]

## 2020-10-04 NOTE — Telephone Encounter (Signed)
Pt scheduled 5/6

## 2020-10-05 NOTE — Telephone Encounter (Signed)
Patient returned call and was notified of Karen's message.

## 2020-10-05 NOTE — Telephone Encounter (Signed)
Called and left a message for patient to return my call.  

## 2020-10-05 NOTE — Telephone Encounter (Signed)
Please let patient know that these two medications are okay to be taken together.  Amlodipine is actually recommended when someone has chronic kidney disease.  We will recheck her kidney function about 2 weeks after starting the rybelsus to make sure there is not effect.  However, there is no contraindication.

## 2020-10-06 ENCOUNTER — Telehealth: Payer: Self-pay | Admitting: Pharmacist

## 2020-10-06 NOTE — Chronic Care Management (AMB) (Signed)
Chronic Care Management Pharmacy Assistant   Name: Angela Russell  MRN: [id number] DOB: [birth year]   Reason for Encounter: Disease State-Hypertension and DM    Recent office visits:  None noted  Recent consult visits:  10/04/20-[name], MD(Oncology)  Hospital visits:  None in previous 6 months  Medications: Outpatient Encounter Medications as of 10/06/2020  Medication Sig  . amLODipine (NORVASC) 5 MG tablet Take 1 tablet (5 mg total) by mouth daily.  . Continuous Blood Gluc Receiver (FREESTYLE LIBRE 2 READER) DEVI 1 applicator by Does not apply route daily.  . Continuous Blood Gluc Sensor (FREESTYLE LIBRE 2 SENSOR) MISC 1 applicator by Does not apply route daily.  . diclofenac sodium (VOLTAREN) 1 % GEL Apply 2 g topically 4 (four) times daily. (Patient taking differently: Apply 2 g topically as needed.)  . docusate sodium (COLACE) 100 MG capsule Take 1 capsule (100 mg total) by mouth daily.  . ferrous sulfate 325 (65 FE) MG EC tablet Take 325 mg by mouth daily with breakfast. OTC  . lidocaine-prilocaine (EMLA) cream Apply 1 application topically as needed.  Marland Kitchen losartan (COZAAR) 25 MG tablet Take 1 tablet (25 mg total) by mouth daily.  . pembrolizumab (KEYTRUDA) 100 MG/4ML SOLN Inject 200 mg into the vein. Every 42 days,    Last txy 4/05, Next 5/03  . polyethylene glycol (MIRALAX / GLYCOLAX) 17 g packet Take 17 g by mouth daily as needed for mild constipation.  . rosuvastatin (CRESTOR) 40 MG tablet Take 1 tablet (40 mg total) by mouth daily.  . Semaglutide (RYBELSUS) 3 MG TABS Take 3 mg by mouth daily. (Patient not taking: Reported on 10/04/2020)  . [DISCONTINUED] prochlorperazine (COMPAZINE) 10 MG tablet Take 1 tablet (10 mg total) by mouth every 6 (six) hours as needed (Nausea or vomiting).   No facility-administered encounter medications on file as of 10/06/2020.   Recent Relevant Labs: Lab Results  Component Value Date/Time   HGBA1C 8.4 (H) 08/02/2020 12:54 PM   HGBA1C 7.6  (H) 03/11/2020 04:47 PM   HGBA1C 7.2 (H) 12/06/2017 04:14 PM   HGBA1C 7.5 (H) 09/06/2017 03:30 PM   HGBA1C per care everywhere 10/15/2016 12:00 AM   MICROALBUR 150 (H) 03/11/2020 04:44 PM   MICROALBUR per care everywhere 07/16/2016 12:00 AM    Kidney Function Lab Results  Component Value Date/Time   CREATININE 1.80 (H) 10/04/2020 08:35 AM   CREATININE 2.10 (H) 09/23/2020 08:06 AM   GFRNONAA 30 (L) 10/04/2020 08:35 AM   GFRAA 27 (L) 03/01/2020 01:01 PM    . Current antihyperglycemic regimen:  o Rybelsus 3 mg  . What recent interventions/DTPs have been made to improve glycemic control:  o None noted  . Have there been any recent hospitalizations or ED visits since last visit with CPP? No   . Patient denies hypoglycemic symptoms, including Pale, Sweaty, Shaky, Hungry, Nervous/irritable and Vision changes   . Patient denies hyperglycemic symptoms, including blurry vision, excessive thirst, fatigue, polyuria and weakness   . How often are you checking your blood sugar? Patient states she does not check her blood sugar, she states her machine is broken.  . What are your blood sugars ranging?  o Fasting:  o Before meals:  o After meals:  o Bedtime:   . During the week, how often does your blood glucose drop below 70? Never   . Are you checking your feet daily/regularly?   Adherence Review: Is the patient currently on a STATIN medication? Yes Is the  patient currently on ACE/ARB medication? Yes Does the patient have >5 day gap between last estimated fill dates? No   Reviewed chart prior to disease state call. Spoke with patient regarding BP  Recent Office Vitals: BP Readings from Last 3 Encounters:  10/04/20 137/73  09/15/20 (!) 144/75  09/06/20 (!) 144/85   Pulse Readings from Last 3 Encounters:  10/04/20 92  09/15/20 97  09/06/20 94    Wt Readings from Last 3 Encounters:  10/04/20 207 lb 11.2 oz (94.2 kg)  09/15/20 209 lb 9.6 oz (95.1 kg)  09/06/20 210 lb (95.3  kg)     Kidney Function Lab Results  Component Value Date/Time   CREATININE 1.80 (H) 10/04/2020 08:35 AM   CREATININE 2.10 (H) 09/23/2020 08:06 AM   GFRNONAA 30 (L) 10/04/2020 08:35 AM   GFRAA 27 (L) 03/01/2020 01:01 PM    BMP Latest Ref Rng & Units 10/04/2020 09/23/2020 09/06/2020  Glucose 70 - 99 mg/dL 179(H) 171(H) 211(H)  BUN 8 - 23 mg/dL 34(H) 38(H) 32(H)  Creatinine 0.44 - 1.00 mg/dL 1.80(H) 2.10(H) 1.83(H)  BUN/Creat Ratio 12 - 28 - 18 -  Sodium 135 - 145 mmol/L 140 140 137  Potassium 3.5 - 5.1 mmol/L 4.4 4.6 4.3  Chloride 98 - 111 mmol/L 107 104 104  CO2 22 - 32 mmol/L 20(L) 20 21(L)  Calcium 8.9 - 10.3 mg/dL 9.0 9.7 9.0    . Current antihypertensive regimen:   Amlodipine 5 mg qd  Losartan 25 mg qd  . How often are you checking your Blood Pressure? 1-2x per week   . Current home BP readings: Patient states her blood pressure ranges around 130/77.  . What recent interventions/DTPs have been made by any provider to improve Blood Pressure control since last CPP Visit: None noted  . Any recent hospitalizations or ED visits since last visit with CPP? No   . What diet changes have been made to improve Blood Pressure Control?  o Patient states she limits her salt.  . What exercise is being done to improve your Blood Pressure Control?  o Patient states she exercises 3 times a week on a stationary bike.  Adherence Review: Is the patient currently on ACE/ARB medication? Yes Does the patient have >5 day gap between last estimated fill dates? No   Patient states she is concerned about the possible interaction between amlodipine and rybelsus. Patient states she read online that it could interact and could cause kidney damage.    Star Rating Drugs: Losartan 25 mg last filled 07/12/20 90 DS Rosuvastatin 40 mg last filled 07/12/20 90 DS    [facility] Clinical Pharmacist Assistant [phone]

## 2020-10-07 ENCOUNTER — Other Ambulatory Visit: Payer: Self-pay

## 2020-10-07 ENCOUNTER — Ambulatory Visit (INDEPENDENT_AMBULATORY_CARE_PROVIDER_SITE_OTHER): Payer: Medicare Other | Admitting: Nurse Practitioner

## 2020-10-07 ENCOUNTER — Encounter: Payer: Self-pay | Admitting: Nurse Practitioner

## 2020-10-07 VITALS — BP 138/72 | HR 96 | Temp 98.1°F | Wt 207.0 lb

## 2020-10-07 DIAGNOSIS — E1159 Type 2 diabetes mellitus with other circulatory complications: Secondary | ICD-10-CM | POA: Diagnosis not present

## 2020-10-07 DIAGNOSIS — I152 Hypertension secondary to endocrine disorders: Secondary | ICD-10-CM

## 2020-10-07 DIAGNOSIS — N1832 Chronic kidney disease, stage 3b: Secondary | ICD-10-CM | POA: Diagnosis not present

## 2020-10-07 DIAGNOSIS — E1165 Type 2 diabetes mellitus with hyperglycemia: Secondary | ICD-10-CM

## 2020-10-07 NOTE — Assessment & Plan Note (Signed)
Patient started Rybelsus 2 days ago.  Recommend patient see Nephrologist for peace of mind regarding kidney disease.

## 2020-10-07 NOTE — Assessment & Plan Note (Signed)
Referral placed for nephrologist follow up.  Lengthy discussed had about diabetes and the effects on kidney function and how patient's medications work with the kidneys.

## 2020-10-07 NOTE — Progress Notes (Signed)
BP (!) 155/83   Pulse 96   Temp 98.1 F (36.7 C)   Wt 207 lb (93.9 kg) Comment: Patient reported  SpO2 98%   BMI 36.67 kg/m    Subjective:    Patient ID: Angela Russell, female    DOB: 03-Jul-1950, 70 y.o.   MRN: [id number]  HPI: Angela Russell is a 70 y.o. female  Chief Complaint  Patient presents with  . Hypertension   HYPERTENSION Hypertension status: controlled  Satisfied with current treatment? yes Duration of hypertension: years BP monitoring frequency:  daily BP range: 130/70 BP medication side effects:  no Medication compliance: excellent compliance Previous BP meds:amlodipine and losartan (cozaar) Aspirin: no Recurrent headaches: no Visual changes: no Palpitations: no Dyspnea: no Chest pain: no Lower extremity edema: no Dizzy/lightheaded: no   Relevant past medical, surgical, family and social history reviewed and updated as indicated. Interim medical history since our last visit reviewed. Allergies and medications reviewed and updated.  Review of Systems  Eyes: Negative for visual disturbance.  Respiratory: Negative for cough, chest tightness and shortness of breath.   Cardiovascular: Negative for chest pain, palpitations and leg swelling.  Neurological: Negative for dizziness and headaches.    Per HPI unless specifically indicated above     Objective:    BP (!) 155/83   Pulse 96   Temp 98.1 F (36.7 C)   Wt 207 lb (93.9 kg) Comment: Patient reported  SpO2 98%   BMI 36.67 kg/m   Wt Readings from Last 3 Encounters:  10/07/20 207 lb (93.9 kg)  10/04/20 207 lb 11.2 oz (94.2 kg)  09/15/20 209 lb 9.6 oz (95.1 kg)    Physical Exam Vitals and nursing note reviewed.  Constitutional:      General: She is not in acute distress.    Appearance: Normal appearance. She is normal weight. She is not ill-appearing, toxic-appearing or diaphoretic.  HENT:     Head: Normocephalic.     Right Ear: External ear normal.     Left Ear: External ear  normal.     Nose: Nose normal.     Mouth/Throat:     Mouth: Mucous membranes are moist.     Pharynx: Oropharynx is clear.  Eyes:     General:        Right eye: No discharge.        Left eye: No discharge.     Extraocular Movements: Extraocular movements intact.     Conjunctiva/sclera: Conjunctivae normal.     Pupils: Pupils are equal, round, and reactive to light.  Cardiovascular:     Rate and Rhythm: Normal rate and regular rhythm.     Heart sounds: No murmur heard.   Pulmonary:     Effort: Pulmonary effort is normal. No respiratory distress.     Breath sounds: Normal breath sounds. No wheezing or rales.  Musculoskeletal:     Cervical back: Normal range of motion and neck supple.  Skin:    General: Skin is warm and dry.     Capillary Refill: Capillary refill takes less than 2 seconds.  Neurological:     General: No focal deficit present.     Mental Status: She is alert and oriented to person, place, and time. Mental status is at baseline.  Psychiatric:        Mood and Affect: Mood normal.        Behavior: Behavior normal.        Thought Content: Thought content normal.  Judgment: Judgment normal.     Results for orders placed or performed in visit on 10/04/20  Comprehensive metabolic panel  Result Value Ref Range   Sodium 140 135 - 145 mmol/L   Potassium 4.4 3.5 - 5.1 mmol/L   Chloride 107 98 - 111 mmol/L   CO2 20 (L) 22 - 32 mmol/L   Glucose, Bld 179 (H) 70 - 99 mg/dL   BUN 34 (H) 8 - 23 mg/dL   Creatinine, Ser 1.80 (H) 0.44 - 1.00 mg/dL   Calcium 9.0 8.9 - 10.3 mg/dL   Total Protein 8.0 6.5 - 8.1 g/dL   Albumin 4.1 3.5 - 5.0 g/dL   AST 22 15 - 41 U/L   ALT 11 0 - 44 U/L   Alkaline Phosphatase 103 38 - 126 U/L   Total Bilirubin 0.4 0.3 - 1.2 mg/dL   GFR, Estimated 30 (L) >60 mL/min   Anion gap 13 5 - 15  TSH  Result Value Ref Range   TSH 1.123 0.350 - 4.500 uIU/mL  CBC with Differential/Platelet  Result Value Ref Range   WBC 5.5 4.0 - 10.5 K/uL    RBC 3.88 3.87 - 5.11 MIL/uL   Hemoglobin 9.3 (L) 12.0 - 15.0 g/dL   HCT 30.2 (L) 36.0 - 46.0 %   MCV 77.8 (L) 80.0 - 100.0 fL   MCH 24.0 (L) 26.0 - 34.0 pg   MCHC 30.8 30.0 - 36.0 g/dL   RDW 14.3 11.5 - 15.5 %   Platelets 202 150 - 400 K/uL   nRBC 0.0 0.0 - 0.2 %   Neutrophils Relative % 62 %   Neutro Abs 3.4 1.7 - 7.7 K/uL   Lymphocytes Relative 27 %   Lymphs Abs 1.5 0.7 - 4.0 K/uL   Monocytes Relative 7 %   Monocytes Absolute 0.4 0.1 - 1.0 K/uL   Eosinophils Relative 3 %   Eosinophils Absolute 0.2 0.0 - 0.5 K/uL   Basophils Relative 0 %   Basophils Absolute 0.0 0.0 - 0.1 K/uL   Immature Granulocytes 1 %   Abs Immature Granulocytes 0.03 0.00 - 0.07 K/uL      Assessment & Plan:   Problem List Items Addressed This Visit      Cardiovascular and Mediastinum   Hypertension associated with diabetes ([place]) - Primary     Endocrine   Uncontrolled type 2 diabetes mellitus ([place])       Follow up plan: No follow-ups on file.

## 2020-10-07 NOTE — Assessment & Plan Note (Signed)
Controlled.  Patient is checking blood pressures at home and they are running 130s/70s.  Continue checking at home.  Return to clinic in 3 months.

## 2020-10-10 ENCOUNTER — Telehealth: Payer: Self-pay

## 2020-10-10 NOTE — Telephone Encounter (Signed)
Patient states that she is doing well and does not feel she needs to reschedule at this time

## 2020-10-10 NOTE — Chronic Care Management (AMB) (Signed)
  Care Management   Note  10/10/2020 Name: Angela Russell MRN: [id number] DOB: [birth year]  Angela Russell is a 70 y.o. year old female who is a primary care patient of [name], NP and is actively engaged with the care management team. I reached out to [name] by phone today to assist with re-scheduling an initial visit with the RN Case Manager  Follow up plan: Patient declines further follow up and engagement with RN CM . Appropriate care team members and provider have been notified via electronic communication.   Noreene Larsson, [place], [place], [place] [address] Direct Dial: [phone] [email or web address]

## 2020-10-25 ENCOUNTER — Other Ambulatory Visit: Payer: Medicare Other

## 2020-10-25 ENCOUNTER — Ambulatory Visit: Payer: Medicare Other | Admitting: Oncology

## 2020-10-25 ENCOUNTER — Ambulatory Visit: Payer: Medicare Other

## 2020-10-26 ENCOUNTER — Other Ambulatory Visit: Payer: Self-pay

## 2020-11-01 ENCOUNTER — Encounter: Payer: Self-pay | Admitting: Oncology

## 2020-11-01 ENCOUNTER — Inpatient Hospital Stay: Payer: Medicare Other

## 2020-11-01 ENCOUNTER — Inpatient Hospital Stay (HOSPITAL_BASED_OUTPATIENT_CLINIC_OR_DEPARTMENT_OTHER): Payer: Medicare Other | Admitting: Oncology

## 2020-11-01 VITALS — BP 130/54 | HR 59 | Resp 17

## 2020-11-01 VITALS — BP 145/85 | HR 62 | Temp 96.9°F | Resp 18 | Wt 203.8 lb

## 2020-11-01 DIAGNOSIS — I129 Hypertensive chronic kidney disease with stage 1 through stage 4 chronic kidney disease, or unspecified chronic kidney disease: Secondary | ICD-10-CM | POA: Diagnosis not present

## 2020-11-01 DIAGNOSIS — C7910 Secondary malignant neoplasm of unspecified urinary organs: Secondary | ICD-10-CM

## 2020-11-01 DIAGNOSIS — K435 Parastomal hernia without obstruction or  gangrene: Secondary | ICD-10-CM

## 2020-11-01 DIAGNOSIS — C679 Malignant neoplasm of bladder, unspecified: Secondary | ICD-10-CM

## 2020-11-01 DIAGNOSIS — N1832 Chronic kidney disease, stage 3b: Secondary | ICD-10-CM

## 2020-11-01 DIAGNOSIS — R635 Abnormal weight gain: Secondary | ICD-10-CM | POA: Diagnosis not present

## 2020-11-01 DIAGNOSIS — N179 Acute kidney failure, unspecified: Secondary | ICD-10-CM

## 2020-11-01 DIAGNOSIS — D631 Anemia in chronic kidney disease: Secondary | ICD-10-CM | POA: Diagnosis not present

## 2020-11-01 DIAGNOSIS — N184 Chronic kidney disease, stage 4 (severe): Secondary | ICD-10-CM

## 2020-11-01 DIAGNOSIS — R739 Hyperglycemia, unspecified: Secondary | ICD-10-CM

## 2020-11-01 DIAGNOSIS — E119 Type 2 diabetes mellitus without complications: Secondary | ICD-10-CM | POA: Diagnosis not present

## 2020-11-01 DIAGNOSIS — Z95828 Presence of other vascular implants and grafts: Secondary | ICD-10-CM

## 2020-11-01 DIAGNOSIS — Z5112 Encounter for antineoplastic immunotherapy: Secondary | ICD-10-CM

## 2020-11-01 LAB — COMPREHENSIVE METABOLIC PANEL
ALT: 11 U/L (ref 0–44)
AST: 23 U/L (ref 15–41)
Albumin: 4.3 g/dL (ref 3.5–5.0)
Alkaline Phosphatase: 101 U/L (ref 38–126)
Anion gap: 11 (ref 5–15)
BUN: 33 mg/dL — ABNORMAL HIGH (ref 8–23)
CO2: 22 mmol/L (ref 22–32)
Calcium: 9.2 mg/dL (ref 8.9–10.3)
Chloride: 107 mmol/L (ref 98–111)
Creatinine, Ser: 1.85 mg/dL — ABNORMAL HIGH (ref 0.44–1.00)
GFR, Estimated: 29 mL/min — ABNORMAL LOW (ref >60)
Glucose, Bld: 128 mg/dL — ABNORMAL HIGH (ref 70–99)
Potassium: 4.1 mmol/L (ref 3.5–5.1)
Sodium: 140 mmol/L (ref 135–145)
Total Bilirubin: 0.5 mg/dL (ref 0.3–1.2)
Total Protein: 7.8 g/dL (ref 6.5–8.1)

## 2020-11-01 LAB — CBC WITH DIFFERENTIAL/PLATELET
Abs Immature Granulocytes: 0.02 10*3/uL (ref 0.00–0.07)
Basophils Absolute: 0 10*3/uL (ref 0.0–0.1)
Basophils Relative: 1 %
Eosinophils Absolute: 0.1 10*3/uL (ref 0.0–0.5)
Eosinophils Relative: 2 %
HCT: 31.2 % — ABNORMAL LOW (ref 36.0–46.0)
Hemoglobin: 9.6 g/dL — ABNORMAL LOW (ref 12.0–15.0)
Immature Granulocytes: 0 %
Lymphocytes Relative: 25 %
Lymphs Abs: 1.4 10*3/uL (ref 0.7–4.0)
MCH: 23.5 pg — ABNORMAL LOW (ref 26.0–34.0)
MCHC: 30.8 g/dL (ref 30.0–36.0)
MCV: 76.3 fL — ABNORMAL LOW (ref 80.0–100.0)
Monocytes Absolute: 0.5 10*3/uL (ref 0.1–1.0)
Monocytes Relative: 8 %
Neutro Abs: 3.6 10*3/uL (ref 1.7–7.7)
Neutrophils Relative %: 64 %
Platelets: 232 10*3/uL (ref 150–400)
RBC: 4.09 MIL/uL (ref 3.87–5.11)
RDW: 14.4 % (ref 11.5–15.5)
WBC: 5.6 10*3/uL (ref 4.0–10.5)
nRBC: 0 % (ref 0.0–0.2)

## 2020-11-01 LAB — FERRITIN: Ferritin: 205 ng/mL (ref 11–307)

## 2020-11-01 LAB — RETIC PANEL
Immature Retic Fract: 13.3 % (ref 2.3–15.9)
RBC.: 4.03 MIL/uL (ref 3.87–5.11)
Retic Count, Absolute: 57.6 10*3/uL (ref 19.0–186.0)
Retic Ct Pct: 1.4 % (ref 0.4–3.1)
Reticulocyte Hemoglobin: 27.6 pg — ABNORMAL LOW (ref >27.9)

## 2020-11-01 LAB — IRON AND TIBC
Iron: 54 ug/dL (ref 28–170)
Saturation Ratios: 16 % (ref 10.4–31.8)
TIBC: 336 ug/dL (ref 250–450)
UIBC: 282 ug/dL

## 2020-11-01 MED ORDER — SODIUM CHLORIDE 0.9 % IV SOLN
200.0000 mg | Freq: Once | INTRAVENOUS | Status: AC
  Administered 2020-11-01: 200 mg via INTRAVENOUS
  Filled 2020-11-01: qty 8

## 2020-11-01 MED ORDER — IRON SUCROSE 20 MG/ML IV SOLN
200.0000 mg | Freq: Once | INTRAVENOUS | Status: AC
  Administered 2020-11-01: 200 mg via INTRAVENOUS
  Filled 2020-11-01: qty 10

## 2020-11-01 MED ORDER — HEPARIN SOD (PORK) LOCK FLUSH 100 UNIT/ML IV SOLN
INTRAVENOUS | Status: AC
  Filled 2020-11-01: qty 5

## 2020-11-01 MED ORDER — HEPARIN SOD (PORK) LOCK FLUSH 100 UNIT/ML IV SOLN
500.0000 [IU] | Freq: Once | INTRAVENOUS | Status: AC
  Filled 2020-11-01: qty 5

## 2020-11-01 MED ORDER — HEPARIN SOD (PORK) LOCK FLUSH 100 UNIT/ML IV SOLN
500.0000 [IU] | Freq: Once | INTRAVENOUS | Status: AC | PRN
  Administered 2020-11-01: 500 [IU]
  Filled 2020-11-01: qty 5

## 2020-11-01 MED ORDER — SODIUM CHLORIDE 0.9% FLUSH
10.0000 mL | Freq: Once | INTRAVENOUS | Status: AC
  Administered 2020-11-01: 10 mL via INTRAVENOUS
  Filled 2020-11-01: qty 10

## 2020-11-01 MED ORDER — SODIUM CHLORIDE 0.9 % IV SOLN
Freq: Once | INTRAVENOUS | Status: AC
Start: 2020-11-01 — End: 2020-11-01
  Filled 2020-11-01: qty 250

## 2020-11-01 NOTE — Progress Notes (Signed)
Pt here for follow up. Pt reports soreness to right arm.

## 2020-11-01 NOTE — Patient Instructions (Signed)
[facility]   Discharge Instructions: Thank you for choosing [facility] to provide your oncology and hematology care.  If you have a lab appointment with the [facility], please go directly to the [facility] and check in at the registration area.  Wear comfortable clothing and clothing appropriate for easy access to any Portacath or PICC line.   We strive to give you quality time with your provider. You may need to reschedule your appointment if you arrive late (15 or more minutes).  Arriving late affects you and other patients whose appointments are after yours.  Also, if you miss three or more appointments without notifying the office, you may be dismissed from the clinic at the provider's discretion.      For prescription refill requests, have your pharmacy contact our office and allow 72 hours for refills to be completed.    Today you received the following chemotherapy and/or immunotherapy agents Keytruda & Venofer   Iron Sucrose injection What is this medicine? IRON SUCROSE (AHY ern SOO krohs) is an iron complex. Iron is used to make healthy red blood cells, which carry oxygen and nutrients throughout the body. This medicine is used to treat iron deficiency anemia in people with chronic kidney disease. This medicine may be used for other purposes; ask your health care provider or pharmacist if you have questions. COMMON BRAND NAME(S): Venofer What should I tell my health care provider before I take this medicine? They need to know if you have any of these conditions:  anemia not caused by low iron levels  heart disease  high levels of iron in the blood  kidney disease  liver disease  an unusual or allergic reaction to iron, other medicines, foods, dyes, or preservatives  pregnant or trying to get pregnant  breast-feeding How should I use this medicine? This medicine is for infusion into a vein. It is given by a health  care professional in a hospital or clinic setting. Talk to your pediatrician regarding the use of this medicine in children. While this drug may be prescribed for children as young as 2 years for selected conditions, precautions do apply. Overdosage: If you think you have taken too much of this medicine contact a poison control center or emergency room at once. NOTE: This medicine is only for you. Do not share this medicine with others. What if I miss a dose? It is important not to miss your dose. Call your doctor or health care professional if you are unable to keep an appointment. What may interact with this medicine? Do not take this medicine with any of the following medications:  deferoxamine  dimercaprol  other iron products This medicine may also interact with the following medications:  chloramphenicol  deferasirox This list may not describe all possible interactions. Give your health care provider a list of all the medicines, herbs, non-prescription drugs, or dietary supplements you use. Also tell them if you smoke, drink alcohol, or use illegal drugs. Some items may interact with your medicine. What should I watch for while using this medicine? Visit your doctor or healthcare professional regularly. Tell your doctor or healthcare professional if your symptoms do not start to get better or if they get worse. You may need blood work done while you are taking this medicine. You may need to follow a special diet. Talk to your doctor. Foods that contain iron include: whole grains/cereals, dried fruits, beans, or peas, leafy green vegetables, and  organ meats (liver, kidney). What side effects may I notice from receiving this medicine? Side effects that you should report to your doctor or health care professional as soon as possible:  allergic reactions like skin rash, itching or hives, swelling of the face, lips, or tongue  breathing problems  changes in blood  pressure  cough  fast, irregular heartbeat  feeling faint or lightheaded, falls  fever or chills  flushing, sweating, or hot feelings  joint or muscle aches/pains  seizures  swelling of the ankles or feet  unusually weak or tired Side effects that usually do not require medical attention (report to your doctor or health care professional if they continue or are bothersome):  diarrhea  feeling achy  headache  irritation at site where injected  nausea, vomiting  stomach upset  tiredness This list may not describe all possible side effects. Call your doctor for medical advice about side effects. You may report side effects to FDA at 1-800-FDA-1088. Where should I keep my medicine? This drug is given in a hospital or clinic and will not be stored at home. NOTE: This sheet is a summary. It may not cover all possible information. If you have questions about this medicine, talk to your doctor, pharmacist, or health care provider.  2021 Elsevier/Gold Standard (2011-03-01 17:14:35)   Pembrolizumab injection What is this medicine? PEMBROLIZUMAB (pem broe liz ue mab) is a monoclonal antibody. It is used to treat certain types of cancer. This medicine may be used for other purposes; ask your health care provider or pharmacist if you have questions. COMMON BRAND NAME(S): Keytruda What should I tell my health care provider before I take this medicine? They need to know if you have any of these conditions:  autoimmune diseases like Crohn's disease, ulcerative colitis, or lupus  have had or planning to have an allogeneic stem cell transplant (uses someone else's stem cells)  history of organ transplant  history of chest radiation  nervous system problems like myasthenia gravis or Guillain-Barre syndrome  an unusual or allergic reaction to pembrolizumab, other medicines, foods, dyes, or preservatives  pregnant or trying to get pregnant  breast-feeding How should I use  this medicine? This medicine is for infusion into a vein. It is given by a health care professional in a hospital or clinic setting. A special MedGuide will be given to you before each treatment. Be sure to read this information carefully each time. Talk to your pediatrician regarding the use of this medicine in children. While this drug may be prescribed for children as young as 6 months for selected conditions, precautions do apply. Overdosage: If you think you have taken too much of this medicine contact a poison control center or emergency room at once. NOTE: This medicine is only for you. Do not share this medicine with others. What if I miss a dose? It is important not to miss your dose. Call your doctor or health care professional if you are unable to keep an appointment. What may interact with this medicine? Interactions have not been studied. This list may not describe all possible interactions. Give your health care provider a list of all the medicines, herbs, non-prescription drugs, or dietary supplements you use. Also tell them if you smoke, drink alcohol, or use illegal drugs. Some items may interact with your medicine. What should I watch for while using this medicine? Your condition will be monitored carefully while you are receiving this medicine. You may need blood work done while you  are taking this medicine. Do not become pregnant while taking this medicine or for 4 months after stopping it. Women should inform their doctor if they wish to become pregnant or think they might be pregnant. There is a potential for serious side effects to an unborn child. Talk to your health care professional or pharmacist for more information. Do not breast-feed an infant while taking this medicine or for 4 months after the last dose. What side effects may I notice from receiving this medicine? Side effects that you should report to your doctor or health care professional as soon as  possible:  allergic reactions like skin rash, itching or hives, swelling of the face, lips, or tongue  bloody or black, tarry  breathing problems  changes in vision  chest pain  chills  confusion  constipation  cough  diarrhea  dizziness or feeling faint or lightheaded  fast or irregular heartbeat  fever  flushing  joint pain  low blood counts - this medicine may decrease the number of white blood cells, red blood cells and platelets. You may be at increased risk for infections and bleeding.  muscle pain  muscle weakness  pain, tingling, numbness in the hands or feet  persistent headache  redness, blistering, peeling or loosening of the skin, including inside the mouth  signs and symptoms of high blood sugar such as dizziness; dry mouth; dry skin; fruity breath; nausea; stomach pain; increased hunger or thirst; increased urination  signs and symptoms of kidney injury like trouble passing urine or change in the amount of urine  signs and symptoms of liver injury like dark urine, light-colored stools, loss of appetite, nausea, right upper belly pain, yellowing of the eyes or skin  sweating  swollen lymph nodes  weight loss Side effects that usually do not require medical attention (report to your doctor or health care professional if they continue or are bothersome):  decreased appetite  hair loss  tiredness This list may not describe all possible side effects. Call your doctor for medical advice about side effects. You may report side effects to FDA at 1-800-FDA-1088. Where should I keep my medicine? This drug is given in a hospital or clinic and will not be stored at home. NOTE: This sheet is a summary. It may not cover all possible information. If you have questions about this medicine, talk to your doctor, pharmacist, or health care provider.  2021 Elsevier/Gold Standard (2019-04-22 21:44:53)       To help prevent nausea and vomiting after your  treatment, we encourage you to take your nausea medication as directed.  BELOW ARE SYMPTOMS THAT SHOULD BE REPORTED IMMEDIATELY: . *FEVER GREATER THAN 100.4 F (38 C) OR HIGHER . *CHILLS OR SWEATING . *NAUSEA AND VOMITING THAT IS NOT CONTROLLED WITH YOUR NAUSEA MEDICATION . *UNUSUAL SHORTNESS OF BREATH . *UNUSUAL BRUISING OR BLEEDING . *URINARY PROBLEMS (pain or burning when urinating, or frequent urination) . *BOWEL PROBLEMS (unusual diarrhea, constipation, pain near the anus) . TENDERNESS IN MOUTH AND THROAT WITH OR WITHOUT PRESENCE OF ULCERS (sore throat, sores in mouth, or a toothache) . UNUSUAL RASH, SWELLING OR PAIN  . UNUSUAL VAGINAL DISCHARGE OR ITCHING   Items with * indicate a potential emergency and should be followed up as soon as possible or go to the Emergency Department if any problems should occur.  Please show the CHEMOTHERAPY ALERT CARD or IMMUNOTHERAPY ALERT CARD at check-in to the Emergency Department and triage nurse.  Should you have questions after your  visit or need to cancel or reschedule your appointment, please contact [facility]  [phone] and follow the prompts.  Office hours are 8:00 a.m. to 4:30 p.m. Monday - Friday. Please note that voicemails left after 4:00 p.m. may not be returned until the following business day.  We are closed weekends and major holidays. You have access to a nurse at all times for urgent questions. Please call the main number to the clinic [phone] and follow the prompts.  For any non-urgent questions, you may also contact your provider using MyChart. We now offer e-Visits for anyone 66 and older to request care online for non-urgent symptoms. For details visit [email or web address].   Also download the MyChart app! Go to the app store, search "MyChart", open the app, select [facility], and log in with your MyChart username and password.  Due to Covid, a mask is required upon entering the  hospital/clinic. If you do not have a mask, one will be given to you upon arrival. For doctor visits, patients may have 1 support person aged 71 or older with them. For treatment visits, patients cannot have anyone with them due to current Covid guidelines and our immunocompromised population.

## 2020-11-01 NOTE — Progress Notes (Signed)
Hematology/Oncology follow up note [facility] Telephone:([phone] [phone]   Patient Care Team: [name], NP as PCP - General [place], [name], OD (Optometry) [name], MD as Consulting Physician (Hematology and Oncology) [name], [facility] (Pharmacist)  REFERRING PROVIDER: Dr.Sninsky CHIEF COMPLAINTS/REASON FOR VISIT:  Follow up for bladder cancer, anemia.   HISTORY OF PRESENTING ILLNESS:  Angela Russell is a  70 y.o.  female with PMH listed below who was referred to me for evaluation of newly diagnosed bladder cancer. Patient initially presented to emergency room at the end of January 2020 for evaluation of dysuria, hematuria and the left lower quadrant inguinal pain and flank pain.  1/28 2020 CT renal stone study showed suspected irregular wall thickening about the superior bladder, not well assessed due to degree of bladder distention.  Recommend cystoscopy for further evaluation.  No renal stone or obstructive uropathy.  Patient was given IV Rocephin and referred patient for outpatient urology follow-up.  Urine culture was negative.  She again presented to ER after 2 days with similar symptoms.  Patient has 25-pack-year smoking history, quit approximately 20 years ago.  No family history of any urology malignancies 07/03/2018 another CT abdomen pelvis with contrast was done which showed no nephrolithiasis or hydronephrosis is identified.  Bladder is decompressed limiting evaluation.  07/16/2018.urology Dr. [name] - cystoscopy and bilateral retrograde pyelogram on 07/16/2018.  Pyelogram did not show any filling defect or abnormalities.  No hydronephrosis.  Ureteral orifice was not involved with tumor.  There is a large 5 cm posterior wall bladder tumor, bullous and sessile appearing.  Patient underwent TURBT.   Pathology: High-grade urothelial carcinoma, invasive into muscularis propria.  Lymphovascular invasion is present.  Carcinoma in situ  is also identified.  Focal squamous differentiation is noted, areas of invasive carcinoma display pleomorphic/sarcomatoid changes.  T2b  08/07/2018 CT without contrast negative.  2 subpleural right upper lobe nodule 2 to 3 mm likely benign. She also had baseline audiometry done.  08/04/2018 ddMVAC x 1 cycle, stopped due to intolerance and AKI.  Patient received 1 cycle of dd MVAC, not able to tolerate due to AKI. Patient then was referred to [facility] urology  09/22/2018 patient underwent a cystectomy, pathology pT3a N0 Mx.  11 lymph nodes were harvested and was all negative. Invasive urothelia carcinoma, high grade, with sarcomatoid features.   10/14/2018 patient was admitted due to pyelonephritis and a pelvic fluid collection.  Drain was placed. 10/23/2018 drain was removed. Patient has had difficulties getting to her appointments to [facility]. 02/09/2019, patient presented with abdominal pain. CT concerning for small bowel obstruction and increased size of right pelvic fluid collection concerning for cancer recurrence.  Right hydronephrosis to the level of pelvis and enlarged lymph nodes. Patient had JP drain placed with CT guidance to pelvic fluid collection and drained 400 cc amber fluid.  Culture was negative for growth of microorganisms and cytology was negative for malignancy.-JP drain was removed on the day of discharge. CT-guided core biopsy of pelvic lymph node adenopathy was attempted but not successful.  # Right lower extremity DVT, provoked by Transvenous biopsy  02/26/2019 transvenous biopsy by IR unsuccessful transvenous biopsy of the right pelvis mass. Post biopsy acute thrombus in the right external iliac and common femoral vein.  Patient was recommended to start anticoagulation with Xarelto however due to the co-pay, patient is not able to afford the medication. Anticoagulation regimen was switched to Eliquis 5 mg.  Patient reports that she has  been taking it once a day. 03/20/2019  PET showed FDG avid tissue in the cystectomy bed, retroperitoneal and pelvic lymphadenopathy.  Right common iliac DVT.  # increased right lower extremity swelling.  She was started on Eliquis for anticoagulation by [facility].  We clarified with her pharmacy and she was actually taking Eliquis 2.5 mg twice daily. Right lower extremity swelling has not improved but instead worsened. 03/16/2019 She had ultrasound right lower extremity done which showed persistent extensive proximal right lower extremity DVT.  Anticoagulation regimen has increased to Eliquis 5 mg twice daily.  # establish care with [facility] oncology Dr. [name] for evaluation.  Dr. [name] recommended starting immunotherapy with PD-L1 inhibitor Pembrolizumab 200 mg every 3 weeks.  The sarcomatoid histology may not respond to chemotherapy well, could portend a better chance of response into immunotherapy PET scan after 4 cycles of Keytruda showed single mildly enlarged central mesenteric lymph node in the upper pelvis with SUV 9.6.  No other abnormal hypermetabolic activity was reported.  # #Right lower extremity  femoral vein DVT provoked by transvenous biopsy in the context of cancer recurrence, Status post IV C filter and mechanical thrombectomy.  IVC filter has been removed. She is currently off anticoagulation after she developed hematuria.  # 03/30/2019 Status post IVC filter placement, mechanical thrombectomy.-IVC filter was retrieved in January 2021. # PD-L1 CPS 100%.  # 03/31/2019 started on immunotherapy Keytruda.  # 04/26/2020 CT chest abdomen pelvis was reviewed and discussed with patient. No definitive finding of disease recurrence or metastasis.  Small amount of ascites similar to prior. There is a parastomal hernia containing a small margin of adjacent small bowel.  This is associated with wall thickening in approximately 7 cm segment of bowel adjacent to the hernia.  There is adjacent abnormal stranding of the mesentery and  omentum.  # 08/19/2020, CT chest abdomen pelvis showed no evidence of new/progressive metastatic disease. NED Hold off treatment due to uncontrolled hypotension.  # Chronic abdominal discomfort due to hernia. 08/11/2020, patient was seen by Dr. Dolphus Jenny for parastomal hernia.  Patient has no obstructive symptoms and would like to continue to monitor her symptoms and hold off any intervention at this point.  Patient was recommended to wear ostomy hernia belt and was referred to [facility] wound ostomy nursing team.   [place] is a 70 y.o. female who has above history reviewed by me today presents for follow up visit for evaluation form metastatic high-grade urothelial carcinoma of the bladder. Sarcomatoid feature.  Patient has been on maintenance immunotherapy.  With Keytruda She is currently on Keytruda every 4 weeks per patient's request. For diabetes, patient is on semaglutide.  She has an upcoming appointment with nephrologist. No new complaints. Denies any nausea vomiting, diarrhea, abdominal pain   Review of Systems  Constitutional: Negative for appetite change, chills, fatigue and fever.  HENT:   Negative for hearing loss and voice change.   Eyes: Negative for eye problems.  Respiratory: Negative for chest tightness and cough.   Cardiovascular: Negative for chest pain and leg swelling.  Gastrointestinal: Negative for abdominal distention, abdominal pain and blood in stool.  Endocrine: Negative for hot flashes.  Genitourinary: Negative for difficulty urinating and frequency.   Musculoskeletal: Negative for arthralgias.  Skin: Negative for itching and rash.  Neurological: Positive for numbness. Negative for extremity weakness.  Hematological: Negative for adenopathy.  Psychiatric/Behavioral: Negative for confusion. The patient is not nervous/anxious.     MEDICAL HISTORY:  Past Medical History:  Diagnosis Date  . Carotid artery plaque, right 01/2014  . CKD  (chronic kidney disease)    stage 2-3  . Controlled diabetes mellitus type 2 with complications ([place])   . Diabetes mellitus without complication ([place])   . DM (diabetes mellitus), type 2, uncontrolled ([place])   . Hyperlipidemia   . Hypertension   . Hypochromic microcytic anemia    mild  . Metastatic urothelial carcinoma ([place]) 03/23/2019  . Osteoporosis   . Renal insufficiency   . Rotator cuff tendonitis, right     SURGICAL HISTORY: Past Surgical History:  Procedure Laterality Date  . ABDOMINAL HYSTERECTOMY  2000   due to bleeding and fibroids, partial- still has ovaries  . CYSTOSCOPY W/ RETROGRADES Bilateral 07/16/2018   Procedure: CYSTOSCOPY WITH RETROGRADE PYELOGRAM;  Surgeon: Billey Co, MD;  Location: [facility] ORS;  Service: Urology;  Laterality: Bilateral;  . IVC FILTER INSERTION N/A 03/30/2019   Procedure: IVC FILTER INSERTION;  Surgeon: Algernon Huxley, MD;  Location: [place] [facility];  Service: Cardiovascular;  Laterality: N/A;  . IVC FILTER REMOVAL N/A 06/15/2019   Procedure: IVC FILTER REMOVAL;  Surgeon: Algernon Huxley, MD;  Location: [place] [facility];  Service: Cardiovascular;  Laterality: N/A;  . KNEE SURGERY Left 03/17/2013   torn meniscus  . PERIPHERAL VASCULAR THROMBECTOMY Right 03/30/2019   Procedure: PERIPHERAL VASCULAR THROMBECTOMY;  Surgeon: Algernon Huxley, MD;  Location: [place] [facility];  Service: Cardiovascular;  Laterality: Right;  . PORTA CATH INSERTION N/A 08/06/2018   Procedure: PORTA CATH INSERTION;  Surgeon: Algernon Huxley, MD;  Location: [place] [facility];  Service: Cardiovascular;  Laterality: N/A;  . TRANSURETHRAL RESECTION OF BLADDER TUMOR N/A 07/16/2018   Procedure: TRANSURETHRAL RESECTION OF BLADDER TUMOR (TURBT);  Surgeon: Billey Co, MD;  Location: [facility] ORS;  Service: Urology;  Laterality: N/A;    SOCIAL HISTORY: Social History   Socioeconomic History  . Marital status: Single    Spouse name: Not on file  . Number of children: 1  .  Years of education: Not on file  . Highest education level: 10th grade  Occupational History  . Not on file  Tobacco Use  . Smoking status: Former Smoker    Quit date: 06/05/1991    Years since quitting: 29.4  . Smokeless tobacco: Never Used  Vaping Use  . Vaping Use: Never used  Substance and Sexual Activity  . Alcohol use: No  . Drug use: No  . Sexual activity: Not Currently  Other Topics Concern  . Not on file  Social History Narrative   Working full time   Social Determinants of Radio broadcast assistant Strain: Low Risk   . Difficulty of Paying Living Expenses: Not hard at all  Food Insecurity: Not on file  Transportation Needs: Not on file  Physical Activity: Not on file  Stress: Not on file  Social Connections: Not on file  Intimate Partner Violence: Not on file    FAMILY HISTORY: Family History  Problem Relation Age of Onset  . Heart disease Mother   . Heart attack Mother   . Arthritis Father   . Diabetes Brother     ALLERGIES:  has No Known Allergies.  MEDICATIONS:  Current Outpatient Medications  Medication Sig Dispense Refill  . amLODipine (NORVASC) 5 MG tablet Take 1 tablet (5 mg total) by mouth daily. 90 tablet 1  . Continuous Blood Gluc Receiver (FREESTYLE LIBRE 2 READER) DEVI 1 applicator by Does not apply route  daily. 1 each 0  . Continuous Blood Gluc Sensor (FREESTYLE LIBRE 2 SENSOR) MISC 1 applicator by Does not apply route daily. 4 each 1  . diclofenac sodium (VOLTAREN) 1 % GEL Apply 2 g topically 4 (four) times daily. (Patient taking differently: Apply 2 g topically as needed.) 100 g 2  . docusate sodium (COLACE) 100 MG capsule Take 1 capsule (100 mg total) by mouth daily. 90 capsule 0  . lidocaine-prilocaine (EMLA) cream Apply 1 application topically as needed. 30 g 3  . losartan (COZAAR) 25 MG tablet Take 1 tablet (25 mg total) by mouth daily. 90 tablet 0  . pembrolizumab (KEYTRUDA) 100 MG/4ML SOLN Inject 200 mg into the vein. Every 42 days,     Last txy 4/05, Next 5/03    . polyethylene glycol (MIRALAX / GLYCOLAX) 17 g packet Take 17 g by mouth daily as needed for mild constipation. 14 each 0  . rosuvastatin (CRESTOR) 40 MG tablet Take 1 tablet (40 mg total) by mouth daily. 90 tablet 1  . Semaglutide (RYBELSUS) 3 MG TABS Take 3 mg by mouth daily. 30 tablet 0   No current facility-administered medications for this visit.     PHYSICAL EXAMINATION: ECOG PERFORMANCE STATUS: 1 - Symptomatic but completely ambulatory Vitals:   11/01/20 0926  BP: (!) 145/85  Pulse: 62  Resp: 18  Temp: (!) 96.9 F (36.1 C)   Filed Weights   11/01/20 0926  Weight: 203 lb 12.8 oz (92.4 kg)    Physical Exam Constitutional:      General: She is not in acute distress.    Comments: Walk independently  HENT:     Head: Normocephalic and atraumatic.  Eyes:     General: No scleral icterus.    Pupils: Pupils are equal, round, and reactive to light.  Cardiovascular:     Rate and Rhythm: Normal rate and regular rhythm.     Heart sounds: Normal heart sounds.  Pulmonary:     Effort: Pulmonary effort is normal. No respiratory distress.     Breath sounds: No wheezing.  Abdominal:     General: Bowel sounds are normal. There is no distension.     Palpations: Abdomen is soft. There is no mass.     Comments: Ureterostomy,   Musculoskeletal:        General: No deformity. Normal range of motion.     Cervical back: Normal range of motion and neck supple.  Skin:    General: Skin is warm and dry.     Coloration: Skin is not pale.     Findings: No erythema or rash.  Neurological:     Mental Status: She is alert and oriented to person, place, and time. Mental status is at baseline.     Cranial Nerves: No cranial nerve deficit.     Coordination: Coordination normal.  Psychiatric:        Mood and Affect: Mood normal.   .  RADIOGRAPHIC STUDIES: I have personally reviewed the radiological images as listed and agreed with the findings in the  report.  CMP Latest Ref Rng & Units 11/01/2020  Glucose 70 - 99 mg/dL 128(H)  BUN 8 - 23 mg/dL 33(H)  Creatinine 0.44 - 1.00 mg/dL 1.85(H)  Sodium 135 - 145 mmol/L 140  Potassium 3.5 - 5.1 mmol/L 4.1  Chloride 98 - 111 mmol/L 107  CO2 22 - 32 mmol/L 22  Calcium 8.9 - 10.3 mg/dL 9.2  Total Protein 6.5 - 8.1 g/dL 7.8  Total  Bilirubin 0.3 - 1.2 mg/dL 0.5  Alkaline Phos 38 - 126 U/L 101  AST 15 - 41 U/L 23  ALT 0 - 44 U/L 11   CBC Latest Ref Rng & Units 11/01/2020  WBC 4.0 - 10.5 K/uL 5.6  Hemoglobin 12.0 - 15.0 g/dL 9.6(L)  Hematocrit 36.0 - 46.0 % 31.2(L)  Platelets 150 - 400 K/uL 232    LABORATORY DATA:  I have reviewed the data as listed Lab Results  Component Value Date   WBC 5.6 11/01/2020   HGB 9.6 (L) 11/01/2020   HCT 31.2 (L) 11/01/2020   MCV 76.3 (L) 11/01/2020   PLT 232 11/01/2020   Recent Labs    02/09/20 0832 02/16/20 1307 03/01/20 1301 03/22/20 0846 09/06/20 0830 09/23/20 0806 10/04/20 0835 11/01/20 0856  NA 138 139 140   < > 137 140 140 140  K 4.4 4.6 4.6   < > 4.3 4.6 4.4 4.1  CL 104 107 108   < > 104 104 107 107  CO2 21* 20* 22   < > 21* 20 20* 22  GLUCOSE 228* 163* 190*   < > 211* 171* 179* 128*  BUN 41* 49* 33*   < > 32* 38* 34* 33*  CREATININE 2.35* 2.41* 2.08*   < > 1.83* 2.10* 1.80* 1.85*  CALCIUM 9.0 9.3 9.3   < > 9.0 9.7 9.0 9.2  GFRNONAA 20* 20* 24*   < > 29*  --  30* 29*  GFRAA 24* 23* 27*  --   --   --   --   --   PROT 7.9 8.6* 7.8   < > 7.7 7.6 8.0 7.8  ALBUMIN 4.3 4.5 4.3   < > 4.1 4.7 4.1 4.3  AST _0 < > _1 ALT _2 < > _3 ALKPHOS 100 86 87   < > 105 139* 103 101  BILITOT 0.6 0.5 0.5   < > 0.5 0.2 0.4 0.5   < > = values in this interval not displayed.   Iron/TIBC/Ferritin/ %Sat    Component Value Date/Time   IRON 54 11/01/2020 0856   IRON 26 (L) 02/04/2019 1309   TIBC 336 11/01/2020 0856   TIBC 251 02/04/2019 1309   FERRITIN 205 11/01/2020 0856   FERRITIN 409 (H) 02/04/2019 1309    IRONPCTSAT 16 11/01/2020 0856   IRONPCTSAT 10 (L) 02/04/2019 1309    RADIOGRAPHIC STUDIES: I have personally reviewed the radiological images as listed and agreed with the findings in the report. CT CHEST ABDOMEN PELVIS WO CONTRAST  Result Date: 08/19/2020 CLINICAL DATA:  Restaging metastatic bladder cancer. History of cystectomy and neobladder surgery in April 2020. EXAM: CT CHEST, ABDOMEN AND PELVIS WITHOUT CONTRAST TECHNIQUE: Multidetector CT imaging of the chest, abdomen and pelvis was performed following the standard protocol without IV contrast. COMPARISON:  Multiple priors including most recent CT chest abdomen pelvis January 06, 2020 and PET-CT September 01, 2019 FINDINGS: CT CHEST FINDINGS Cardiovascular: Normal size heart. No significant pericardial effusion/thickening. Coronary artery calcifications. Aortic atherosclerosis. No thoracic aortic aneurysm. Mediastinum/Nodes: Scattered prominent non pathologically enlarged mediastinal and hilar lymph nodes are stable. No discrete thyroid nodularity. Trachea esophagus appear within normal limits. Lungs/Pleura: Stable emphysematous change in scattered pulmonary scarring. No suspicious pulmonary nodules or masses. No pleural effusion. No pneumothorax. Stable pericardial cyst in the right inferior pulmonary vein. Musculoskeletal: No acute osseous abnormality. No aggressive lytic or  blastic lesion of bone. CT ABDOMEN PELVIS FINDINGS Hepatobiliary: Unremarkable noncontrast appearance of the hepatic parenchyma. Gallbladder is unremarkable. No biliary ductal dilation. Pancreas: Unremarkable. Spleen: Unremarkable. Adrenals/Urinary Tract: The adrenal glands are unremarkable. Next. No hydronephrosis. No nephrolithiasis. No contour deforming renal lesions. Stable postsurgical changes cystectomy with urinary diversion procedure with ileal conduit. Stomach/Bowel: The stomach, duodenum, small bowel and colon are grossly unremarkable. Normal appendix. Terminal ileum is  unremarkable. No acute inflammatory changes, mass lesions or obstructive findings. Vascular/Lymphatic: Stable advanced atherosclerotic calcifications of the aorta and branch vessels. Right iliac artery stent is again visualized. No pathologically enlarged mesenteric retroperitoneal or pelvic lymph nodes. A few prominent scattered mesenteric lymph nodes are again noted and appear stable in size. Reproductive: Surgically absent. Other: Small volume pelvic free fluid. Unchanged appearance of the nodularity along the left pelvic sidewall surgical clips. Musculoskeletal: No aggressive lytic or blastic lesion of bone. Moderate lower lumbar degenerative change. Degenerative change of the SI joints and bilateral hips. IMPRESSION: 1. Stable postsurgical changes cystectomy with urinary diversion procedure with ileal conduit. 2. No evidence of new or progressive metastatic disease within the chest, abdomen, or pelvis. 3. Stable advanced atherosclerotic calcifications involving the aorta and branch vessels. 4. Stable emphysematous change and pulmonary scarring. 5. Emphysema and aortic atherosclerosis. Aortic Atherosclerosis (ICD10-I70.0) and Emphysema (ICD10-J43.9). Electronically Signed   By: Dahlia Bailiff MD   On: 08/19/2020 12:03     ASSESSMENT & PLAN:  1. Metastatic urothelial carcinoma ([place])   2. Encounter for antineoplastic immunotherapy   3. Anemia in stage 3b chronic kidney disease ([place])   4. Weight gain   5. Parastomal hernia without obstruction or gangrene   6. Hyperglycemia   7. Stage 4 chronic kidney disease [facility])   Cancer Staging Bladder carcinoma [facility]) Staging form: Urinary Bladder, AJCC 8th Edition - Clinical stage from 08/01/2018: Stage IVA (cTX, cN3, cM1a) - Signed by [name], MD on 04/21/2019  #Metastatic urothelial carcinoma of bladder, sarcomatoid features pelvic sidewall mass biopsy showed metastatic carcinoma consistent with involvement by urothelial carcinoma. Labs reviewed and  discussed with patient Proceed with Keytruda today.  #Hernia -parastomal hernia.   She has establish care with [facility] hernia center.  She was recommended to monitor her symptoms and aware ostomy belt  #Chronic kidney disease, creatinine has improved to her baseline.  Encourage oral hydration.  Avoid nephrotoxin.  #Chronic anemia secondary to stage III CKD.  Hemoglobin has further decreased to 9.6 today.   She agrees with IV Venofer.  We will proceed with IV Venofer 200 mg x 1 today.  #History of diabetes. A1c few months ago was 8.4 History of diabetes continue follow-up with primary care provider for treatment.  on semaglutide  #Weight gain.  TSH has been monitored and has been normal.  She has been referred to establish care with dietitian and no showed.   #Follow-up in 4 weeks for repeat blood work, MD evaluation and Keytruda and Venofer.  Patient prefers every 4 weeks treatment versus every 3 weeks.  .All questions were answered. The patient knows to call the clinic with any problems questions or concerns.  [name], MD, PhD Hematology Oncology [facility] Pager- [phone] 11/01/20

## 2020-11-04 ENCOUNTER — Other Ambulatory Visit: Payer: Self-pay | Admitting: Nurse Practitioner

## 2020-11-04 DIAGNOSIS — E118 Type 2 diabetes mellitus with unspecified complications: Secondary | ICD-10-CM

## 2020-11-04 MED ORDER — AMLODIPINE BESYLATE 5 MG PO TABS: 5.0000 mg | ORAL_TABLET | Freq: Every day | ORAL | 1 refills | Status: DC

## 2020-11-04 NOTE — Telephone Encounter (Signed)
Requested medication (s) are due for refill today: yes   Requested medication (s) are on the active medication list: yes   Last refill: 09/29/2020  Future visit scheduled:yes   Notes to clinic:  medication not assigned to a protocol, review manually     Requested Prescriptions  Pending Prescriptions Disp Refills   RYBELSUS 3 MG TABS [Pharmacy Med Name: Rybelsus 3 MG Oral Tablet] 30 tablet 0    Sig: Take 1 tablet by mouth once daily      Off-Protocol Failed - 11/04/2020 12:47 PM      Failed - Medication not assigned to a protocol, review manually.      Passed - Valid encounter within last 12 months    Recent Outpatient Visits           4 weeks ago Hypertension associated with diabetes ([place])   [facility] [name], NP   1 month ago Controlled type 2 diabetes mellitus with complication, without long-term current use of insulin ([place])   [facility] [name], NP   2 months ago Hypertension associated with diabetes ([place])   [facility] [name], NP   6 months ago Hypertension associated with diabetes [facility])   [facility] Eulogio Bear, NP   7 months ago Hypertension associated with diabetes ([place])   [facility] [place] Eulogio Bear, NP       Future Appointments             In 2 months [name], NP [place], [facility]               JARDIANCE 10 MG TABS tablet [Pharmacy Med Name: Jardiance 10 MG Oral Tablet] 90 tablet 0    Sig: Take 1 tablet by mouth once daily      Endocrinology:  Diabetes - SGLT2 Inhibitors Failed - 11/04/2020 12:47 PM      Failed - Cr in normal range and within 360 days    Creatinine, Ser  Date Value Ref Range Status  11/01/2020 1.85 (H) 0.44 - 1.00 mg/dL Final          Failed - HBA1C is between 0 and 7.9 and within 180 days    Hemoglobin A1C  Date Value Ref Range Status  10/15/2016 per care everywhere  Final   HB A1C ([facility]YER DCA -  WAIVED)  Date Value Ref Range Status  12/06/2017 7.2 (H) <7.0 % Final    Comment:                                          Diabetic Adult            <7.0                                       Healthy Adult        4.3 - 5.7                                                           (DCCT/NGSP) American Diabetes Association's Summary of Glycemic Recommendations for Adults with Diabetes: Hemoglobin  A1c <7.0%. More stringent glycemic goals (A1c <6.0%) may further reduce complications at the cost of increased risk of hypoglycemia.    Hgb A1c MFr Bld  Date Value Ref Range Status  08/02/2020 8.4 (H) 4.8 - 5.6 % Final    Comment:    (NOTE) Pre diabetes:          5.7%-6.4%  Diabetes:              >6.4%  Glycemic control for   <7.0% adults with diabetes           Failed - AA eGFR in normal range and within 360 days    GFR calc Af Amer  Date Value Ref Range Status  03/01/2020 27 (L) >60 mL/min Final   GFR, Estimated  Date Value Ref Range Status  11/01/2020 29 (L) >60 mL/min Final    Comment:    (NOTE) Calculated using the CKD-EPI Creatinine Equation (2021)    eGFR  Date Value Ref Range Status  09/23/2020 25 (L) >59 mL/min/1.73 Final          Passed - LDL in normal range and within 360 days    LDL Chol Calc (NIH)  Date Value Ref Range Status  03/11/2020 55 0 - 99 mg/dL Final          Passed - Valid encounter within last 6 months    Recent Outpatient Visits           4 weeks ago Hypertension associated with diabetes ([place])   [facility] [name], NP   1 month ago Controlled type 2 diabetes mellitus with complication, without long-term current use of insulin ([place])   [place], Santiago Glad, NP   2 months ago Hypertension associated with diabetes [facility])   [name] [name], NP   6 months ago Hypertension associated with diabetes [facility])   [facility] Eulogio Bear, NP   7 months ago  Hypertension associated with diabetes [facility])   [place] Eulogio Bear, NP       Future Appointments             In 2 months [name], NP [facility] [place]

## 2020-11-09 ENCOUNTER — Telehealth: Payer: Medicare Other

## 2020-11-10 ENCOUNTER — Telehealth: Payer: Medicare Other

## 2020-11-23 DIAGNOSIS — N184 Chronic kidney disease, stage 4 (severe): Secondary | ICD-10-CM | POA: Diagnosis not present

## 2020-11-23 DIAGNOSIS — E1122 Type 2 diabetes mellitus with diabetic chronic kidney disease: Secondary | ICD-10-CM | POA: Diagnosis not present

## 2020-11-23 DIAGNOSIS — I10 Essential (primary) hypertension: Secondary | ICD-10-CM | POA: Diagnosis not present

## 2020-11-28 ENCOUNTER — Other Ambulatory Visit: Payer: Self-pay

## 2020-11-28 ENCOUNTER — Ambulatory Visit (INDEPENDENT_AMBULATORY_CARE_PROVIDER_SITE_OTHER): Payer: Medicare Other | Admitting: Pharmacist

## 2020-11-28 DIAGNOSIS — Z1329 Encounter for screening for other suspected endocrine disorder: Secondary | ICD-10-CM

## 2020-11-28 DIAGNOSIS — C7910 Secondary malignant neoplasm of unspecified urinary organs: Secondary | ICD-10-CM

## 2020-11-28 DIAGNOSIS — D631 Anemia in chronic kidney disease: Secondary | ICD-10-CM

## 2020-11-28 DIAGNOSIS — E1165 Type 2 diabetes mellitus with hyperglycemia: Secondary | ICD-10-CM

## 2020-11-28 DIAGNOSIS — N1832 Chronic kidney disease, stage 3b: Secondary | ICD-10-CM

## 2020-11-28 NOTE — Progress Notes (Signed)
Chronic Care Management Pharmacy Note  11/29/2020 Name:  Angela Russell MRN:  [id number] DOB:  [birth year]  Summary: Patient is not checking her blood sugar at home. Advised patient to contact [place] regarding her co-pay for [employer]. RX sent by PCP in April.  Recommendations/Changes made from today's visit: Recommend decreasing rosuvastatin to 10 mg daily which is the max recommended for her renal impairment. Could switch to atorvastatin which doesn't require renal adjustment but patient is resistant to new medications. Recommend increasing Rybelsus to 7 mg for glucose lowering efficacy. Patient recently picked up 90 day supply and patient assistance application is in process. Recommend addition of Farxiga for reanl preservation if GFR . 25 ml/min at next check.  Plan: CPA will follow up with patient in 2-4 weeks.   Subjective: Angela Russell is an 70 y.o. year old female who is a primary patient of [name], NP.  The CCM team was consulted for assistance with disease management and care coordination needs.    Engaged with patient by telephone for follow up visit in response to provider referral for pharmacy case management and/or care coordination services.   Consent to Services:  The patient was given information about Chronic Care Management services, agreed to services, and gave verbal consent prior to initiation of services.  Please see initial visit note for detailed documentation.   Patient Care Team: [name], NP as PCP - General Nice, [name], OD (Optometry) [name], MD as Consulting Physician (Hematology and Oncology) [name], [facility] (Pharmacist)  Recent office visits: 10/07/20- [place](PCP)- Nephro referral 4/14/22Mathis Dad, NP (PCP)- CMET,  GFR too low for Jardiance recommend GLP-1, pt refuses, ccm referral  Recent consult visits: 11/23/20- Singh(nephrology)- consider Wilder Glade if GFR > 25 ml/min 5/31/22Tasia Catchings )hem/onc)- labs,   retic panel, Beryle Flock, Iv Venofer 4/05/22Tasia Catchings (hem/onc) Keytruda 200 mg infusion, next in 42 days-- metastatic urothelial cancer 08/11/20- Sharen Counter [facility] gen surg) parastomal hernia eval,  03/10/20- Singh(nephrology)- baselin scr 1.53 GFR 34-40, bupmp to 2.35 sept carpal tunnel  surgery and ibuprofen - return in 3 months 03/09/21- Nice (optometry)- retinal exam Hospital visits: None in previous 6 months  Objective:  Lab Results  Component Value Date   CREATININE 1.57 (H) 11/29/2020   BUN 32 (H) 11/29/2020   GFRNONAA 35 (L) 11/29/2020   GFRAA 27 (L) 03/01/2020   NA 139 11/29/2020   K 4.2 11/29/2020   CALCIUM 8.9 11/29/2020   CO2 22 11/29/2020   GLUCOSE 141 (H) 11/29/2020    Lab Results  Component Value Date/Time   HGBA1C 8.4 (H) 08/02/2020 12:54 PM   HGBA1C 7.6 (H) 03/11/2020 04:47 PM   HGBA1C 7.2 (H) 12/06/2017 04:14 PM   HGBA1C 7.5 (H) 09/06/2017 03:30 PM   HGBA1C per care everywhere 10/15/2016 12:00 AM   MICROALBUR 150 (H) 03/11/2020 04:44 PM   MICROALBUR per care everywhere 07/16/2016 12:00 AM    Last diabetic Eye exam:  Lab Results  Component Value Date/Time   HMDIABEYEEXA No Retinopathy 04/27/2020 12:00 AM    Last diabetic Foot exam: No results found for: HMDIABFOOTEX   Lab Results  Component Value Date   CHOL 133 03/11/2020   HDL 60 03/11/2020   LDLCALC 55 03/11/2020   TRIG 97 03/11/2020    Hepatic Function Latest Ref Rng & Units 11/29/2020 11/01/2020 10/04/2020  Total Protein 6.5 - 8.1 g/dL 7.3 7.8 8.0  Albumin 3.5 - 5.0 g/dL 3.9 4.3 4.1  AST 15 - 41 U/L 21 23 22  ALT 0 - 44 U/L _0 Alk Phosphatase 38 - 126 U/L 104 101 103  Total Bilirubin 0.3 - 1.2 mg/dL 0.5 0.5 0.4  Bilirubin, Direct 0.0 - 0.2 mg/dL - - -    Lab Results  Component Value Date/Time   TSH 1.007 11/29/2020 08:41 AM   TSH 1.123 10/04/2020 08:35 AM    CBC Latest Ref Rng & Units 11/29/2020 11/01/2020 10/04/2020  WBC 4.0 - 10.5 K/uL 6.0 5.6 5.5  Hemoglobin 12.0 - 15.0 g/dL 9.2(L) 9.6(L)  9.3(L)  Hematocrit 36.0 - 46.0 % 30.6(L) 31.2(L) 30.2(L)  Platelets 150 - 400 K/uL 214 232 202    No results found for: VD25OH  Clinical ASCVD: Yes  The 10-year ASCVD risk score Mikey Bussing DC Jr., et al., 2013) is: 20.8%   Values used to calculate the score:     Age: 56 years     Sex: Female     Is Non-Hispanic African American: Yes     Diabetic: Yes     Tobacco smoker: No     Systolic Blood Pressure: 831 mmHg     Is BP treated: Yes     HDL Cholesterol: 60 mg/dL     Total Cholesterol: 133 mg/dL    Depression screen [facility] 2/9 09/15/2020 06/22/2019  Decreased Interest 0 0  Down, Depressed, Hopeless 0 0  PHQ - 2 Score 0 0  Some recent data might be hidden       Social History   Tobacco Use  Smoking Status Former   Pack years: 0.00   Types: Cigarettes   Quit date: 06/05/1991   Years since quitting: 29.5  Smokeless Tobacco Never   BP Readings from Last 3 Encounters:  11/29/20 131/62  11/01/20 (!) 130/54  11/01/20 (!) 145/85   Pulse Readings from Last 3 Encounters:  11/29/20 80  11/01/20 (!) 59  11/01/20 62   Wt Readings from Last 3 Encounters:  11/29/20 203 lb 6.4 oz (92.3 kg)  11/01/20 203 lb 12.8 oz (92.4 kg)  10/07/20 207 lb (93.9 kg)   BMI Readings from Last 3 Encounters:  11/29/20 36.03 kg/m  11/01/20 36.10 kg/m  10/07/20 36.67 kg/m    Assessment/Interventions: Review of patient past medical history, allergies, medications, health status, including review of consultants reports, laboratory and other test data, was performed as part of comprehensive evaluation and provision of chronic care management services.   SDOH:  (Social Determinants of Health) assessments and interventions performed: No  SDOH Screenings   Alcohol Screen: Not on file  Depression (PHQ2-9): Low Risk    PHQ-2 Score: 0  Financial Resource Strain: Low Risk    Difficulty of Paying Living Expenses: Not hard at all  Food Insecurity: Not on file  Housing: Not on file  Physical Activity:  Not on file  Social Connections: Not on file  Stress: Not on file  Tobacco Use: Medium Risk   Smoking Tobacco Use: Former   Smokeless Tobacco Use: Never  Transportation Needs: Not on file     Immunization History  Administered Date(s) Administered   Fluad Quad(high Dose 65+) 06/22/2019, 03/11/2020   Influenza Split 03/25/2015   Influenza, High Dose Seasonal PF 05/31/2017, 04/11/2018   PFIZER(Purple Top)SARS-COV-2 Vaccination 09/04/2019, 09/29/2019, 05/20/2020   Pneumococcal Conjugate-13 05/04/2017   Pneumococcal Polysaccharide-23 04/09/2013    Conditions to be addressed/monitored:  Hypertension, Hyperlipidemia, Diabetes, Coronary Artery Disease, Chronic Kidney Disease and anemia of CKD, metastatic urothelial cancer  Care Plan : [place]  Updates made  by [name], [place] since 11/29/2020 12:00 AM     Problem: DM, HTN, HLD, anemia, CKD, Cancer- metatstatic   Priority: High     Goal: disease management   Start Date: 09/28/2020  Recent Progress: Not on track  Priority: High  Note:   Current Barriers:  Unable to independently monitor therapeutic efficacy Unable to achieve control of diabetes  Suboptimal therapeutic regimen for diabetes Unable to self administer medications as prescribed Does not adhere to prescribed medication regimen Does not maintain contact with provider office  Pharmacist Clinical Goal(s):  Patient will achieve adherence to monitoring guidelines and medication adherence to achieve therapeutic efficacy achieve control of diabetes as evidenced by lab values adhere to plan to optimize therapeutic regimen for diabetes as evidenced by report of adherence to recommended medication management changes achieve ability to self administer medications as prescribed through use of pill box, patient assitance if needed as evidenced by patient report adhere to prescribed medication regimen as evidenced by fill dates contact provider office for  questions/concerns as evidenced notation of same in electronic health record through collaboration with PharmD and provider.   Interventions: 1:1 collaboration with [name], NP regarding development and update of comprehensive plan of care as evidenced by provider attestation and co-signature Inter-disciplinary care team collaboration (see longitudinal plan of care) Comprehensive medication review performed; medication list updated in electronic medical record  BP Readings from Last 3 Encounters:  11/01/20 (!) 130/54  11/01/20 (!) 145/85  10/07/20 138/72   Hypertension CKD stage3b-  4(BP goal <140/90) -Not ideally controlled -Current treatment: Amlodipine 5 mg qd Losartan 25 mg qd -Medications previously tried: NA -Current home readings: unable to provide readings but does check at home per her report. 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 -Current dietary habits: eats breakfast and supper daily -Current exercise habits: rides stationary bike 30 minutes, 3 days per week -Reports hypotensive/hypertensive symptoms -Educated on BP goals and benefits of medications for prevention of heart attack, stroke and kidney damage; Daily salt intake goal < 2300 mg; Exercise goal of 150 minutes per week; Importance of home blood pressure monitoring; -Counseled to monitor BP at home daily, document, and provide log at future appointments -Counseled on diet and exercise extensively Recommended to continue current medication  Recommended patient consider Wilder Glade for kidney protection if GFR > 25 ml/min next check. 11/28/20: Patient saw Dr. Candiss Norse 11/23/20, no changes BP,  volume okay  Lab Results  Component Value Date   LDLCALC 55 03/11/2020   Lab Results  Component Value Date   CREATININE 1.85 (H) 11/01/2020  eGFR 29 ml/min  Hyperlipidemia: (LDL goal < 70) -Controlled -Current treatment: Rosuvastatin 40 mg qd -Medications previously tried: na  --Educated on  Cholesterol goals;  Benefits of statin for ASCVD risk reduction; Importance of limiting foods high in cholesterol; Exercise goal of 150 minutes per week; -Counseled on diet and exercise extensively Recommended renal adjust dose to 10 mg as CRCL< 30 ml/min or change to atorvastatin which doesn't require renal adjustment.   Lab Results  Component Value Date   HGBA1C 8.4 (H) 08/02/2020   Lab Results  Component Value Date   CREATININE 2.10 (H) 09/23/2020  EGFR~ 25 ml/min  Diabetes CKD III b (A1c goal <7%) -Not ideally controlled -Current medications: Rybelsus 3 mg qd -Medications previously tried: jardiance, metformin  -Current home glucose readings fasting glucose: Not checking, meter is broken and does not like finger sticks post prandial glucose: not checking -Denies  hypoglycemic/hyperglycemic symptoms --Educated on A1c and blood sugar goals; Complications of diabetes including kidney damage, retinal damage, and cardiovascular disease; Exercise goal of 150 minutes per week; Benefits of weight loss; Benefits of routine self-monitoring of blood sugar; Continuous glucose monitoring; Patient would like a [employer]. Will collaborate with PCP to send through [place] to see if coverage available Update: Patient to call [facility] for update on copay for freestyle libre. -Counseled to check feet daily and get yearly eye exams -Counseled on diet and exercise extensively Counseled on weight loss and A1c reduction of weekly GLP-1 injection. 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 Patient fearful any injectable will cost her job. Recommended patient start Rybelsus 37m x 30 days then increase to 7 mg daily.  Obtain new glucometer and begin checking bid - tid. Collaborated with PCP to send in new RX for Rybelsus. Will initiate patient assistance if needed. Assessed patient finances. Will initiate patient assistance if co-pay cost prohibitive  for Rybelsus.  Recommend consider Farxiga 10 mg qd for renal protection if SCR normalizes and egfr> 247mmin.  Will not receive BG benefit due to low GFR. 11/28/20: Recommend increasing to 7 mg dose for BG lowering effect. Will discuss with PCP.   Anemia of CKD (Goal: prevent symptoms, avoid complications) -Uncontrolled -Current treatment  Ferrous sulfate 325 mg qam (does not take regularly due to constipation) Venofer 200 mg q 4 weeks   1 st dose 11/01/20 -Medications previously tried: NA  -Counseled on diet and exercise extensively Recommended to continue current medication Recommended patient consider IV Venofer infusion as recommended by hematology.  Bladder cancer s/p cystectomy & urostomy bag (Goal: palliation of symptoms) -Not ideally controlled -Current treatment  Keytruda 200 mg q 28 days -Medications previously tried: na  -Recommended continue to follow with oncology.   Patient Goals/Self-Care Activities Patient will:  - take medications as prescribed -focus on medication adherence by using pill box -check glucose twice daily, document, and provide at future appointments -check blood pressure daily, document, and provide at future appointments -collaborate with provider on medication access solutions -target a minimum of 150 minutes of moderate intensity exercise weekly  Follow Up Plan: Telephone follow up appointment with care management team member scheduled for:          Medication Assistance: Application for Rybelsus  medication assistance program. in process.  Anticipated assistance start date unknown.  See plan of care for additional detail. Update 11/28/20- Spoke with [employer] states the application is on hold and requests copy of patient's insurance card. Printed [employer] card from chart and faxed to [employer]  Compliance/Adherence/Medication fill history: Care Gaps: Dexa scan Statin non-compliance  Star-Rating Drugs: Losartan 25 mg   LF 11/04/20 #90 Rosuvastatin 49 mg 07/12/20 #90 Rybelsus 11/05/20 #90  Patient's preferred pharmacy is:  [facility] [place] [id number]: [phone]: [address] [address] [place]: [phone]: [phone] pill box? No - doesn't need Pt endorses 80% compliance  We discussed: Benefits of medication synchronization, packaging and delivery as well as enhanced pharmacist oversight with [employer]. Patient decided to: Continue current medication management strategy  Care Plan and Follow Up Patient Decision:  Patient agrees to Care Plan and Follow-up.  Plan: Telephone  follow up appointment with care management team member scheduled for:  1 month CPA, 2 months PharmD  Junita Push. Kenton Kingfisher PharmD, [place] [facility] [phone]

## 2020-11-29 ENCOUNTER — Inpatient Hospital Stay: Payer: Medicare Other | Attending: Oncology

## 2020-11-29 ENCOUNTER — Encounter: Payer: Self-pay | Admitting: Oncology

## 2020-11-29 ENCOUNTER — Inpatient Hospital Stay: Payer: Medicare Other

## 2020-11-29 ENCOUNTER — Inpatient Hospital Stay (HOSPITAL_BASED_OUTPATIENT_CLINIC_OR_DEPARTMENT_OTHER): Payer: Medicare Other | Admitting: Oncology

## 2020-11-29 VITALS — BP 131/62 | HR 80 | Temp 97.0°F | Wt 203.4 lb

## 2020-11-29 DIAGNOSIS — Z79899 Other long term (current) drug therapy: Secondary | ICD-10-CM | POA: Diagnosis not present

## 2020-11-29 DIAGNOSIS — N1832 Chronic kidney disease, stage 3b: Secondary | ICD-10-CM

## 2020-11-29 DIAGNOSIS — C679 Malignant neoplasm of bladder, unspecified: Secondary | ICD-10-CM | POA: Diagnosis not present

## 2020-11-29 DIAGNOSIS — I129 Hypertensive chronic kidney disease with stage 1 through stage 4 chronic kidney disease, or unspecified chronic kidney disease: Secondary | ICD-10-CM | POA: Diagnosis not present

## 2020-11-29 DIAGNOSIS — Z86718 Personal history of other venous thrombosis and embolism: Secondary | ICD-10-CM | POA: Insufficient documentation

## 2020-11-29 DIAGNOSIS — K5900 Constipation, unspecified: Secondary | ICD-10-CM | POA: Diagnosis not present

## 2020-11-29 DIAGNOSIS — D509 Iron deficiency anemia, unspecified: Secondary | ICD-10-CM | POA: Insufficient documentation

## 2020-11-29 DIAGNOSIS — Z95828 Presence of other vascular implants and grafts: Secondary | ICD-10-CM

## 2020-11-29 DIAGNOSIS — D631 Anemia in chronic kidney disease: Secondary | ICD-10-CM | POA: Diagnosis not present

## 2020-11-29 DIAGNOSIS — R918 Other nonspecific abnormal finding of lung field: Secondary | ICD-10-CM | POA: Diagnosis not present

## 2020-11-29 DIAGNOSIS — M7989 Other specified soft tissue disorders: Secondary | ICD-10-CM | POA: Diagnosis not present

## 2020-11-29 DIAGNOSIS — K435 Parastomal hernia without obstruction or  gangrene: Secondary | ICD-10-CM | POA: Insufficient documentation

## 2020-11-29 DIAGNOSIS — E1122 Type 2 diabetes mellitus with diabetic chronic kidney disease: Secondary | ICD-10-CM | POA: Insufficient documentation

## 2020-11-29 DIAGNOSIS — E785 Hyperlipidemia, unspecified: Secondary | ICD-10-CM | POA: Diagnosis not present

## 2020-11-29 DIAGNOSIS — R635 Abnormal weight gain: Secondary | ICD-10-CM | POA: Insufficient documentation

## 2020-11-29 DIAGNOSIS — Z5112 Encounter for antineoplastic immunotherapy: Secondary | ICD-10-CM

## 2020-11-29 DIAGNOSIS — Z87891 Personal history of nicotine dependence: Secondary | ICD-10-CM | POA: Insufficient documentation

## 2020-11-29 DIAGNOSIS — C7910 Secondary malignant neoplasm of unspecified urinary organs: Secondary | ICD-10-CM

## 2020-11-29 DIAGNOSIS — M810 Age-related osteoporosis without current pathological fracture: Secondary | ICD-10-CM | POA: Insufficient documentation

## 2020-11-29 DIAGNOSIS — Z1329 Encounter for screening for other suspected endocrine disorder: Secondary | ICD-10-CM

## 2020-11-29 DIAGNOSIS — N179 Acute kidney failure, unspecified: Secondary | ICD-10-CM

## 2020-11-29 LAB — COMPREHENSIVE METABOLIC PANEL
ALT: 11 U/L (ref 0–44)
AST: 21 U/L (ref 15–41)
Albumin: 3.9 g/dL (ref 3.5–5.0)
Alkaline Phosphatase: 104 U/L (ref 38–126)
Anion gap: 10 (ref 5–15)
BUN: 32 mg/dL — ABNORMAL HIGH (ref 8–23)
CO2: 22 mmol/L (ref 22–32)
Calcium: 8.9 mg/dL (ref 8.9–10.3)
Chloride: 107 mmol/L (ref 98–111)
Creatinine, Ser: 1.57 mg/dL — ABNORMAL HIGH (ref 0.44–1.00)
GFR, Estimated: 35 mL/min — ABNORMAL LOW (ref >60)
Glucose, Bld: 141 mg/dL — ABNORMAL HIGH (ref 70–99)
Potassium: 4.2 mmol/L (ref 3.5–5.1)
Sodium: 139 mmol/L (ref 135–145)
Total Bilirubin: 0.5 mg/dL (ref 0.3–1.2)
Total Protein: 7.3 g/dL (ref 6.5–8.1)

## 2020-11-29 LAB — CBC WITH DIFFERENTIAL/PLATELET
Abs Immature Granulocytes: 0.07 10*3/uL (ref 0.00–0.07)
Basophils Absolute: 0 10*3/uL (ref 0.0–0.1)
Basophils Relative: 0 %
Eosinophils Absolute: 0.1 10*3/uL (ref 0.0–0.5)
Eosinophils Relative: 2 %
HCT: 30.6 % — ABNORMAL LOW (ref 36.0–46.0)
Hemoglobin: 9.2 g/dL — ABNORMAL LOW (ref 12.0–15.0)
Immature Granulocytes: 1 %
Lymphocytes Relative: 25 %
Lymphs Abs: 1.5 10*3/uL (ref 0.7–4.0)
MCH: 23.9 pg — ABNORMAL LOW (ref 26.0–34.0)
MCHC: 30.1 g/dL (ref 30.0–36.0)
MCV: 79.5 fL — ABNORMAL LOW (ref 80.0–100.0)
Monocytes Absolute: 0.4 10*3/uL (ref 0.1–1.0)
Monocytes Relative: 7 %
Neutro Abs: 3.9 10*3/uL (ref 1.7–7.7)
Neutrophils Relative %: 65 %
Platelets: 214 10*3/uL (ref 150–400)
RBC: 3.85 MIL/uL — ABNORMAL LOW (ref 3.87–5.11)
RDW: 14.6 % (ref 11.5–15.5)
WBC: 6 10*3/uL (ref 4.0–10.5)
nRBC: 0 % (ref 0.0–0.2)

## 2020-11-29 LAB — RETIC PANEL
Immature Retic Fract: 13.6 % (ref 2.3–15.9)
RBC.: 3.84 MIL/uL — ABNORMAL LOW (ref 3.87–5.11)
Retic Count, Absolute: 62.6 10*3/uL (ref 19.0–186.0)
Retic Ct Pct: 1.6 % (ref 0.4–3.1)
Reticulocyte Hemoglobin: 27.1 pg — ABNORMAL LOW (ref >27.9)

## 2020-11-29 LAB — TSH: TSH: 1.007 u[IU]/mL (ref 0.350–4.500)

## 2020-11-29 MED ORDER — SODIUM CHLORIDE 0.9 % IV SOLN
200.0000 mg | Freq: Once | INTRAVENOUS | Status: AC
  Administered 2020-11-29: 200 mg via INTRAVENOUS
  Filled 2020-11-29: qty 8

## 2020-11-29 MED ORDER — HEPARIN SOD (PORK) LOCK FLUSH 100 UNIT/ML IV SOLN
500.0000 [IU] | Freq: Once | INTRAVENOUS | Status: AC
Start: 2020-11-29 — End: 2020-11-29
  Administered 2020-11-29: 500 [IU] via INTRAVENOUS
  Filled 2020-11-29: qty 5

## 2020-11-29 MED ORDER — SODIUM CHLORIDE 0.9% FLUSH
10.0000 mL | Freq: Once | INTRAVENOUS | Status: AC
  Administered 2020-11-29: 10 mL via INTRAVENOUS
  Filled 2020-11-29: qty 10

## 2020-11-29 MED ORDER — HEPARIN SOD (PORK) LOCK FLUSH 100 UNIT/ML IV SOLN
500.0000 [IU] | Freq: Once | INTRAVENOUS | Status: DC | PRN
  Filled 2020-11-29: qty 5

## 2020-11-29 MED ORDER — IRON SUCROSE 20 MG/ML IV SOLN
200.0000 mg | Freq: Once | INTRAVENOUS | Status: DC
  Filled 2020-11-29: qty 10

## 2020-11-29 MED ORDER — HEPARIN SOD (PORK) LOCK FLUSH 100 UNIT/ML IV SOLN
INTRAVENOUS | Status: AC
  Filled 2020-11-29: qty 5

## 2020-11-29 MED ORDER — SODIUM CHLORIDE 0.9 % IV SOLN
Freq: Once | INTRAVENOUS | Status: AC
  Filled 2020-11-29: qty 250

## 2020-11-29 NOTE — Progress Notes (Signed)
Patient reports feeling sick after receiving last venofer infusion. Prefers to wait until next appointment to receive next dose of venofer. Dr Tasia Catchings aware.

## 2020-11-29 NOTE — Patient Instructions (Signed)
[facility] [place] [facility]  Discharge Instructions: Thank you for choosing [facility] to provide your oncology and hematology care.  If you have a lab appointment with the Cancer Center, please go directly to the Cancer Center and check in at the registration area.  Wear comfortable clothing and clothing appropriate for easy access to any Portacath or PICC line.   We strive to give you quality time with your provider. You may need to reschedule your appointment if you arrive late (15 or more minutes).  Arriving late affects you and other patients whose appointments are after yours.  Also, if you miss three or more appointments without notifying the office, you may be dismissed from the clinic at the provider's discretion.      For prescription refill requests, have your pharmacy contact our office and allow 72 hours for refills to be completed.    Today you received the following chemotherapy and/or immunotherapy agents Keytruda      To help prevent nausea and vomiting after your treatment, we encourage you to take your nausea medication as directed.  BELOW ARE SYMPTOMS THAT SHOULD BE REPORTED IMMEDIATELY: *FEVER GREATER THAN 100.4 F (38 C) OR HIGHER *CHILLS OR SWEATING *NAUSEA AND VOMITING THAT IS NOT CONTROLLED WITH YOUR NAUSEA MEDICATION *UNUSUAL SHORTNESS OF BREATH *UNUSUAL BRUISING OR BLEEDING *URINARY PROBLEMS (pain or burning when urinating, or frequent urination) *BOWEL PROBLEMS (unusual diarrhea, constipation, pain near the anus) TENDERNESS IN MOUTH AND THROAT WITH OR WITHOUT PRESENCE OF ULCERS (sore throat, sores in mouth, or a toothache) UNUSUAL RASH, SWELLING OR PAIN  UNUSUAL VAGINAL DISCHARGE OR ITCHING   Items with * indicate a potential emergency and should be followed up as soon as possible or go to the Emergency Department if any problems should occur.  Please show the CHEMOTHERAPY ALERT CARD or IMMUNOTHERAPY ALERT CARD at check-in to  the Emergency Department and triage nurse.  Should you have questions after your visit or need to cancel or reschedule your appointment, please contact [facility] [place] [facility]  [phone] and follow the prompts.  Office hours are 8:00 a.m. to 4:30 p.m. Monday - Friday. Please note that voicemails left after 4:00 p.m. may not be returned until the following business day.  We are closed weekends and major holidays. You have access to a nurse at all times for urgent questions. Please call the main number to the clinic [phone] and follow the prompts.  For any non-urgent questions, you may also contact your provider using MyChart. We now offer e-Visits for anyone 18 and older to request care online for non-urgent symptoms. For details visit [email or web address].   Also download the MyChart app! Go to the app store, search "MyChart", open the app, select [facility], and log in with your MyChart username and password.  Due to Covid, a mask is required upon entering the hospital/clinic. If you do not have a mask, one will be given to you upon arrival. For doctor visits, patients may have 1 support person aged 18 or older with them. For treatment visits, patients cannot have anyone with them due to current Covid guidelines and our immunocompromised population. Pembrolizumab injection What is this medication? PEMBROLIZUMAB (pem broe liz ue mab) is a monoclonal antibody. It is used totreat certain types of cancer. This medicine may be used for other purposes; ask your health care provider orpharmacist if you have questions. COMMON BRAND NAME(S): Keytruda What should I tell my care team before I take this   medication? They need to know if you have any of these conditions: autoimmune diseases like Crohn's disease, ulcerative colitis, or lupus have had or planning to have an allogeneic stem cell transplant (uses someone else's stem cells) history of organ transplant history of  chest radiation nervous system problems like myasthenia gravis or Guillain-Barre syndrome an unusual or allergic reaction to pembrolizumab, other medicines, foods, dyes, or preservatives pregnant or trying to get pregnant breast-feeding How should I use this medication? This medicine is for infusion into a vein. It is given by a health careprofessional in a hospital or clinic setting. A special MedGuide will be given to you before each treatment. Be sure to readthis information carefully each time. Talk to your pediatrician regarding the use of this medicine in children. While this drug may be prescribed for children as young as 6 months for selectedconditions, precautions do apply. Overdosage: If you think you have taken too much of this medicine contact apoison control center or emergency room at once. NOTE: This medicine is only for you. Do not share this medicine with others. What if I miss a dose? It is important not to miss your dose. Call your doctor or health careprofessional if you are unable to keep an appointment. What may interact with this medication? Interactions have not been studied. This list may not describe all possible interactions. Give your health care provider a list of all the medicines, herbs, non-prescription drugs, or dietary supplements you use. Also tell them if you smoke, drink alcohol, or use illegaldrugs. Some items may interact with your medicine. What should I watch for while using this medication? Your condition will be monitored carefully while you are receiving thismedicine. You may need blood work done while you are taking this medicine. Do not become pregnant while taking this medicine or for 4 months after stopping it. Women should inform their doctor if they wish to become pregnant or think they might be pregnant. There is a potential for serious side effects to an unborn child. Talk to your health care professional or pharmacist for more information. Do  not breast-feed an infant while taking this medicine orfor 4 months after the last dose. What side effects may I notice from receiving this medication? Side effects that you should report to your doctor or health care professionalas soon as possible: allergic reactions like skin rash, itching or hives, swelling of the face, lips, or tongue bloody or black, tarry breathing problems changes in vision chest pain chills confusion constipation cough diarrhea dizziness or feeling faint or lightheaded fast or irregular heartbeat fever flushing joint pain low blood counts - this medicine may decrease the number of white blood cells, red blood cells and platelets. You may be at increased risk for infections and bleeding. muscle pain muscle weakness pain, tingling, numbness in the hands or feet persistent headache redness, blistering, peeling or loosening of the skin, including inside the mouth signs and symptoms of high blood sugar such as dizziness; dry mouth; dry skin; fruity breath; nausea; stomach pain; increased hunger or thirst; increased urination signs and symptoms of kidney injury like trouble passing urine or change in the amount of urine signs and symptoms of liver injury like dark urine, light-colored stools, loss of appetite, nausea, right upper belly pain, yellowing of the eyes or skin sweating swollen lymph nodes weight loss Side effects that usually do not require medical attention (report to yourdoctor or health care professional if they continue or are bothersome): decreased appetite hair loss tiredness   This list may not describe all possible side effects. Call your doctor for medical advice about side effects. You may report side effects to FDA at1-800-FDA-1088. Where should I keep my medication? This drug is given in a hospital or clinic and will not be stored at home. NOTE: This sheet is a summary. It may not cover all possible information. If you have questions about  this medicine, talk to your doctor, pharmacist, orhealth care provider.  2022 [employer]/Gold Standard (2019-04-22 21:44:53)  

## 2020-11-29 NOTE — Progress Notes (Signed)
Hematology/Oncology follow up note [facility] Telephone:([phone] [phone]   Patient Care Team: [name], NP as PCP - General Nice, [name], OD (Optometry) [name], MD as Consulting Physician (Hematology and Oncology) [name], [facility] (Pharmacist)  REFERRING PROVIDER: Dr.Sninsky CHIEF COMPLAINTS/REASON FOR VISIT:  Follow up for bladder cancer, anemia.   HISTORY OF PRESENTING ILLNESS:  Angela Russell is a  70 y.o.  female with PMH listed below who was referred to me for evaluation of newly diagnosed bladder cancer. Patient initially presented to emergency room at the end of January 2020 for evaluation of dysuria, hematuria and the left lower quadrant inguinal pain and flank pain.  1/28 2020 CT renal stone study showed suspected irregular wall thickening about the superior bladder, not well assessed due to degree of bladder distention.  Recommend cystoscopy for further evaluation.  No renal stone or obstructive uropathy.  Patient was given IV Rocephin and referred patient for outpatient urology follow-up.  Urine culture was negative.  She again presented to ER after 2 days with similar symptoms.  Patient has 25-pack-year smoking history, quit approximately 20 years ago.  No family history of any urology malignancies 07/03/2018 another CT abdomen pelvis with contrast was done which showed no nephrolithiasis or hydronephrosis is identified.  Bladder is decompressed limiting evaluation.  07/16/2018.urology Dr. [name] - cystoscopy and bilateral retrograde pyelogram on 07/16/2018.  Pyelogram did not show any filling defect or abnormalities.  No hydronephrosis.  Ureteral orifice was not involved with tumor.  There is a large 5 cm posterior wall bladder tumor, bullous and sessile appearing.  Patient underwent TURBT.   Pathology: High-grade urothelial carcinoma, invasive into muscularis propria.  Lymphovascular invasion is present.  Carcinoma in situ  is also identified.  Focal squamous differentiation is noted, areas of invasive carcinoma display pleomorphic/sarcomatoid changes.  T2b  08/07/2018 CT without contrast negative.  2 subpleural right upper lobe nodule 2 to 3 mm likely benign. She also had baseline audiometry done.  08/04/2018 ddMVAC x 1 cycle, stopped due to intolerance and AKI.  Patient received 1 cycle of dd MVAC, not able to tolerate due to AKI. Patient then was referred to [facility] urology  09/22/2018 patient underwent a cystectomy, pathology pT3a N0 Mx.  11 lymph nodes were harvested and was all negative. Invasive urothelia carcinoma, high grade, with sarcomatoid features.   10/14/2018 patient was admitted due to pyelonephritis and a pelvic fluid collection.  Drain was placed. 10/23/2018 drain was removed. Patient has had difficulties getting to her appointments to [facility]. 02/09/2019, patient presented with abdominal pain. CT concerning for small bowel obstruction and increased size of right pelvic fluid collection concerning for cancer recurrence.  Right hydronephrosis to the level of pelvis and enlarged lymph nodes. Patient had JP drain placed with CT guidance to pelvic fluid collection and drained 400 cc amber fluid.  Culture was negative for growth of microorganisms and cytology was negative for malignancy.-JP drain was removed on the day of discharge. CT-guided core biopsy of pelvic lymph node adenopathy was attempted but not successful.  # Right lower extremity DVT, provoked by Transvenous biopsy  02/26/2019 transvenous biopsy by IR unsuccessful transvenous biopsy of the right pelvis mass. Post biopsy acute thrombus in the right external iliac and common femoral vein.  Patient was recommended to start anticoagulation with Xarelto however due to the co-pay, patient is not able to afford the medication. Anticoagulation regimen was switched to Eliquis 5 mg.  Patient reports that she has  been taking it once a day. 03/20/2019  PET showed FDG avid tissue in the cystectomy bed, retroperitoneal and pelvic lymphadenopathy.  Right common iliac DVT.  # increased right lower extremity swelling.  She was started on Eliquis for anticoagulation by [facility].  We clarified with her pharmacy and she was actually taking Eliquis 2.5 mg twice daily. Right lower extremity swelling has not improved but instead worsened. 03/16/2019 She had ultrasound right lower extremity done which showed persistent extensive proximal right lower extremity DVT.  Anticoagulation regimen has increased to Eliquis 5 mg twice daily.  # establish care with [facility] oncology Dr. [name] for evaluation.  Dr. [name] recommended starting immunotherapy with PD-L1 inhibitor Pembrolizumab 200 mg every 3 weeks.  The sarcomatoid histology may not respond to chemotherapy well, could portend a better chance of response into immunotherapy PET scan after 4 cycles of Keytruda showed single mildly enlarged central mesenteric lymph node in the upper pelvis with SUV 9.6.  No other abnormal hypermetabolic activity was reported.  # #Right lower extremity  femoral vein DVT provoked by transvenous biopsy in the context of cancer recurrence, Status post IV C filter and mechanical thrombectomy.  IVC filter has been removed. She is currently off anticoagulation after she developed hematuria.  # 03/30/2019 Status post IVC filter placement, mechanical thrombectomy.-IVC filter was retrieved in January 2021. # PD-L1 CPS 100%.  # 03/31/2019 started on immunotherapy Keytruda.  # 04/26/2020 CT chest abdomen pelvis was reviewed and discussed with patient. No definitive finding of disease recurrence or metastasis.  Small amount of ascites similar to prior. There is a parastomal hernia containing a small margin of adjacent small bowel.  This is associated with wall thickening in approximately 7 cm segment of bowel adjacent to the hernia.  There is adjacent abnormal stranding of the mesentery and  omentum.  # 08/19/2020, CT chest abdomen pelvis showed no evidence of new/progressive metastatic disease. NED Hold off treatment due to uncontrolled hypotension.  # Chronic abdominal discomfort due to hernia. 08/11/2020, patient was seen by Dr. Dolphus Jenny for parastomal hernia.  Patient has no obstructive symptoms and would like to continue to monitor her symptoms and hold off any intervention at this point.  Patient was recommended to wear ostomy hernia belt and was referred to [facility] wound ostomy nursing team.   [place] is a 70 y.o. female who has above history reviewed by me today presents for follow up visit for evaluation form metastatic high-grade urothelial carcinoma of the bladder. Sarcomatoid feature.  Patient has been on maintenance immunotherapy.  With Keytruda She is currently on Keytruda every 4 weeks per patient's request. She also received one dose of IV venofer. She reports not feeling well for a few days, nausea and diarrhea which resolved spontaneously.  Denies any nausea vomiting, diarrhea, abdominal pain today.  She has constipation. No blood in stool.    Review of Systems  Constitutional:  Negative for appetite change, chills, fatigue and fever.  HENT:   Negative for hearing loss and voice change.   Eyes:  Negative for eye problems.  Respiratory:  Negative for chest tightness and cough.   Cardiovascular:  Negative for chest pain and leg swelling.  Gastrointestinal:  Positive for constipation. Negative for abdominal distention, abdominal pain and blood in stool.  Endocrine: Negative for hot flashes.  Genitourinary:  Negative for difficulty urinating and frequency.   Musculoskeletal:  Negative for arthralgias.  Skin:  Negative for itching and rash.  Neurological:  Positive for numbness.  Negative for extremity weakness.  Hematological:  Negative for adenopathy.  Psychiatric/Behavioral:  Negative for confusion. The patient is not nervous/anxious.     MEDICAL HISTORY:  Past Medical History:  Diagnosis Date   Carotid artery plaque, right 01/2014   CKD (chronic kidney disease)    stage 2-3   Controlled diabetes mellitus type 2 with complications (HCC)    Diabetes mellitus without complication (HCC)    DM (diabetes mellitus), type 2, uncontrolled (HCC)    Hyperlipidemia    Hypertension    Hypochromic microcytic anemia    mild   Metastatic urothelial carcinoma ([place]) 03/23/2019   Osteoporosis    Renal insufficiency    Rotator cuff tendonitis, right     SURGICAL HISTORY: Past Surgical History:  Procedure Laterality Date   ABDOMINAL HYSTERECTOMY  2000   due to bleeding and fibroids, partial- still has ovaries   CYSTOSCOPY W/ RETROGRADES Bilateral 07/16/2018   Procedure: CYSTOSCOPY WITH RETROGRADE PYELOGRAM;  Surgeon: Billey Co, MD;  Location: [facility] ORS;  Service: Urology;  Laterality: Bilateral;   IVC FILTER INSERTION N/A 03/30/2019   Procedure: IVC FILTER INSERTION;  Surgeon: Algernon Huxley, MD;  Location: [place] [facility];  Service: Cardiovascular;  Laterality: N/A;   IVC FILTER REMOVAL N/A 06/15/2019   Procedure: IVC FILTER REMOVAL;  Surgeon: Algernon Huxley, MD;  Location: [place] [facility];  Service: Cardiovascular;  Laterality: N/A;   KNEE SURGERY Left 03/17/2013   torn meniscus   PERIPHERAL VASCULAR THROMBECTOMY Right 03/30/2019   Procedure: PERIPHERAL VASCULAR THROMBECTOMY;  Surgeon: Algernon Huxley, MD;  Location: [place] [facility];  Service: Cardiovascular;  Laterality: Right;   PORTA CATH INSERTION N/A 08/06/2018   Procedure: PORTA CATH INSERTION;  Surgeon: Algernon Huxley, MD;  Location: [place] [facility];  Service: Cardiovascular;  Laterality: N/A;   TRANSURETHRAL RESECTION OF BLADDER TUMOR N/A 07/16/2018   Procedure: TRANSURETHRAL RESECTION OF BLADDER TUMOR (TURBT);  Surgeon: Billey Co, MD;  Location: [facility] ORS;  Service: Urology;  Laterality: N/A;    SOCIAL HISTORY: Social History   Socioeconomic  History   Marital status: Single    Spouse name: Not on file   Number of children: 1   Years of education: Not on file   Highest education level: 10th grade  Occupational History   Not on file  Tobacco Use   Smoking status: Former    Pack years: 0.00    Types: Cigarettes    Quit date: 06/05/1991    Years since quitting: 29.5   Smokeless tobacco: Never  Vaping Use   Vaping Use: Never used  Substance and Sexual Activity   Alcohol use: No   Drug use: No   Sexual activity: Not Currently  Other Topics Concern   Not on file  Social History Narrative   Working full time   Social Determinants of Radio broadcast assistant Strain: Low Risk    Difficulty of Paying Living Expenses: Not hard at all  Food Insecurity: Not on file  Transportation Needs: Not on file  Physical Activity: Not on file  Stress: Not on file  Social Connections: Not on file  Intimate Partner Violence: Not on file    FAMILY HISTORY: Family History  Problem Relation Age of Onset   Heart disease Mother    Heart attack Mother    Arthritis Father    Diabetes Brother     ALLERGIES:  has No Known Allergies.  MEDICATIONS:  Current Outpatient Medications  Medication  Sig Dispense Refill   amLODipine (NORVASC) 5 MG tablet Take 1 tablet (5 mg total) by mouth daily. 90 tablet 1   Continuous Blood Gluc Receiver (FREESTYLE LIBRE 2 READER) DEVI 1 applicator by Does not apply route daily. (Patient not taking: Reported on 11/28/2020) 1 each 0   Continuous Blood Gluc Sensor (FREESTYLE LIBRE 2 SENSOR) MISC 1 applicator by Does not apply route daily. (Patient not taking: Reported on 11/28/2020) 4 each 1   diclofenac sodium (VOLTAREN) 1 % GEL Apply 2 g topically 4 (four) times daily. (Patient taking differently: Apply 2 g topically as needed.) 100 g 2   docusate sodium (COLACE) 100 MG capsule Take 1 capsule (100 mg total) by mouth daily. 90 capsule 0   JARDIANCE 10 MG TABS tablet Take 1 tablet by mouth once daily (Patient  not taking: Reported on 11/28/2020) 90 tablet 1   lidocaine-prilocaine (EMLA) cream Apply 1 application topically as needed. 30 g 3   losartan (COZAAR) 25 MG tablet Take 1 tablet (25 mg total) by mouth daily. 90 tablet 0   pembrolizumab (KEYTRUDA) 100 MG/4ML SOLN Inject 200 mg into the vein. Every 42 days,    Last txy 4/05, Next 5/03     polyethylene glycol (MIRALAX / GLYCOLAX) 17 g packet Take 17 g by mouth daily as needed for mild constipation. 14 each 0   rosuvastatin (CRESTOR) 40 MG tablet Take 1 tablet (40 mg total) by mouth daily. 90 tablet 1   RYBELSUS 3 MG TABS Take 1 tablet by mouth once daily 90 tablet 1   No current facility-administered medications for this visit.   Facility-Administered Medications Ordered in Other Visits  Medication Dose Route Frequency Provider Last Rate Last Admin   heparin lock flush 100 unit/mL  500 Units Intracatheter Once PRN [name], MD       iron sucrose (VENOFER) injection 200 mg  200 mg Intravenous Once [name], MD         PHYSICAL EXAMINATION: ECOG PERFORMANCE STATUS: 1 - Symptomatic but completely ambulatory Vitals:   11/29/20 0914  BP: 131/62  Pulse: 80  Temp: (!) 97 F (36.1 C)   Filed Weights   11/29/20 0914  Weight: 203 lb 6.4 oz (92.3 kg)    Physical Exam Constitutional:      General: She is not in acute distress.    Comments: Walk independently  HENT:     Head: Normocephalic and atraumatic.  Eyes:     General: No scleral icterus.    Pupils: Pupils are equal, round, and reactive to light.  Cardiovascular:     Rate and Rhythm: Normal rate and regular rhythm.     Heart sounds: Normal heart sounds.  Pulmonary:     Effort: Pulmonary effort is normal. No respiratory distress.     Breath sounds: No wheezing.  Abdominal:     General: Bowel sounds are normal. There is no distension.     Palpations: Abdomen is soft. There is no mass.     Comments: Ureterostomy,   Musculoskeletal:        General: No deformity. Normal range of  motion.     Cervical back: Normal range of motion and neck supple.  Skin:    General: Skin is warm and dry.     Coloration: Skin is not pale.     Findings: No erythema or rash.  Neurological:     Mental Status: She is alert and oriented to person, place, and time. Mental status is at  baseline.     Cranial Nerves: No cranial nerve deficit.     Coordination: Coordination normal.  Psychiatric:        Mood and Affect: Mood normal.  .  RADIOGRAPHIC STUDIES: I have personally reviewed the radiological images as listed and agreed with the findings in the report.  CMP Latest Ref Rng & Units 11/29/2020  Glucose 70 - 99 mg/dL 141(H)  BUN 8 - 23 mg/dL 32(H)  Creatinine 0.44 - 1.00 mg/dL 1.57(H)  Sodium 135 - 145 mmol/L 139  Potassium 3.5 - 5.1 mmol/L 4.2  Chloride 98 - 111 mmol/L 107  CO2 22 - 32 mmol/L 22  Calcium 8.9 - 10.3 mg/dL 8.9  Total Protein 6.5 - 8.1 g/dL 7.3  Total Bilirubin 0.3 - 1.2 mg/dL 0.5  Alkaline Phos 38 - 126 U/L 104  AST 15 - 41 U/L 21  ALT 0 - 44 U/L 11   CBC Latest Ref Rng & Units 11/29/2020  WBC 4.0 - 10.5 K/uL 6.0  Hemoglobin 12.0 - 15.0 g/dL 9.2(L)  Hematocrit 36.0 - 46.0 % 30.6(L)  Platelets 150 - 400 K/uL 214    LABORATORY DATA:  I have reviewed the data as listed Lab Results  Component Value Date   WBC 6.0 11/29/2020   HGB 9.2 (L) 11/29/2020   HCT 30.6 (L) 11/29/2020   MCV 79.5 (L) 11/29/2020   PLT 214 11/29/2020   Recent Labs    02/09/20 0832 02/16/20 1307 03/01/20 1301 03/22/20 0846 10/04/20 0835 11/01/20 0856 11/29/20 0841  NA 138 139 140   < > 140 140 139  K 4.4 4.6 4.6   < > 4.4 4.1 4.2  CL 104 107 108   < > 107 107 107  CO2 21* 20* 22   < > 20* 22 22  GLUCOSE 228* 163* 190*   < > 179* 128* 141*  BUN 41* 49* 33*   < > 34* 33* 32*  CREATININE 2.35* 2.41* 2.08*   < > 1.80* 1.85* 1.57*  CALCIUM 9.0 9.3 9.3   < > 9.0 9.2 8.9  GFRNONAA 20* 20* 24*   < > 30* 29* 35*  GFRAA 24* 23* 27*  --   --   --   --   PROT 7.9 8.6* 7.8   < > 8.0  7.8 7.3  ALBUMIN 4.3 4.5 4.3   < > 4.1 4.3 3.9  AST $Re'27 22 23   'sHN$ < > $R'22 23 21  'wd$ ALT $'14 14 13   'B$ < > $R'11 11 11  'aD$ ALKPHOS 100 86 87   < > 103 101 104  BILITOT 0.6 0.5 0.5   < > 0.4 0.5 0.5   < > = values in this interval not displayed.    Iron/TIBC/Ferritin/ %Sat    Component Value Date/Time   IRON 54 11/01/2020 0856   IRON 26 (L) 02/04/2019 1309   TIBC 336 11/01/2020 0856   TIBC 251 02/04/2019 1309   FERRITIN 205 11/01/2020 0856   FERRITIN 409 (H) 02/04/2019 1309   IRONPCTSAT 16 11/01/2020 0856   IRONPCTSAT 10 (L) 02/04/2019 1309    RADIOGRAPHIC STUDIES: I have personally reviewed the radiological images as listed and agreed with the findings in the report. No results found.    ASSESSMENT & PLAN:  1. Metastatic urothelial carcinoma ([place])   2. Anemia in stage 3b chronic kidney disease ([place])   3. Port-A-Cath in place   4. Encounter for antineoplastic immunotherapy   5. Weight gain  Cancer Staging Bladder carcinoma [facility]) Staging form: Urinary Bladder, AJCC 8th Edition - Clinical stage from 08/01/2018: Stage IVA (cTX, cN3, cM1a) - Signed by [name], MD on 04/21/2019  #Metastatic urothelial carcinoma of bladder, sarcomatoid features pelvic sidewall mass biopsy showed metastatic carcinoma consistent with involvement by urothelial carcinoma., Stage IV NED- PET 09/01/19 Labs are reviewed and discussed with patient. Proceed with Angela Russell.   #Hernia -parastomal hernia.   She has establish care with [facility] hernia center.  She was recommended to monitor her symptoms and aware ostomy belt  #Chronic kidney disease, creatinine has improved to her baseline.  Encourage oral hydration.  Avoid nephrotoxin.  #Chronic anemia secondary to stage III CKD.  Hemoglobin has further decreased to 9.2 today.   She agrees to try  IV Venofer 200 mg x 1 at next visit.  Will check retic panel and iron panel at next visit. Suspect that she may have chronic GI blood loss. Discussed about colonoscopy.  #History  of diabetes. A1c few months ago was 8.4 History of diabetes continue follow-up with primary care provider for treatment.  on semaglutide  #Weight gain.  TSH has been monitored and has been normal.  She has been referred to establish care with dietitian and no showed.   #Follow-up in 4 weeks for repeat blood work, MD evaluation and Keytruda and Venofer.  Patient prefers every 4 weeks treatment versus every 3 weeks.  .All questions were answered. The patient knows to call the clinic with any problems questions or concerns.  [name], MD, PhD Hematology Oncology [facility] Pager- [phone] 11/29/20

## 2020-11-29 NOTE — Patient Instructions (Signed)
Visit Information  It was a pleasure speaking with you today. Thank you for letting me be part of your clinical team. Please call with any questions or concerns.    Goals Addressed             This Visit's Progress    Manage My Diet       Timeframe:  Long-Range Goal Priority:  High Start Date:                             Expected End Date:                       Follow Up Date 2 months - ask for help if I have trouble affording healthy foods - choose foods that are low in sodium (salt) - eat 3 to 5 servings of fruits and vegetables each day - prepare or eat main meal at home 3 to 5 days each week - keep healthy snacks on hand - read food labels for sodium (salt), fat and sugar content - watch for swelling in feet, ankles and legs every day    Why is this important?   A healthy diet is important for mental and physical health.  Healthy food helps repair damaged body tissue and maintains strong bones and muscles.  No single food is just right so eating a variety of proteins, fruits, vegetables and grains is best.  You may need to change what you eat or drink to manage kidney disease.  A dietitian is the best person to guide you.     Notes:       Monitor and Manage My Blood Sugar-Diabetes Type 2   Not on track    Timeframe:  Short-Term Goal Priority:  High Start Date:                             Expected End Date:                       Follow Up Date one month CPA follow up    - check blood sugar at prescribed times - check blood sugar before and after exercise - check blood sugar if I feel it is too high or too low - take the blood sugar meter to all doctor visits    Why is this important?   Checking your blood sugar at home helps to keep it from getting very high or very low.  Writing the results in a diary or log helps the doctor know how to care for you.  Your blood sugar log should have the time, date and the results.  Also, write down the amount of insulin or  other medicine that you take.  Other information, like what you ate, exercise done and how you were feeling, will also be helpful.     Notes: 11/28/20: Not checking BG at home. Will call [employer] for information on Freestyle libre that was called in 09/15/20.      Track and Manage My Blood Pressure-Hypertension   Not on track    Timeframe:  Long-Range Goal Priority:  High Start Date:                             Expected End Date:  Follow Up Date one month CPA follow-up    - check blood pressure daily - write blood pressure results in a log or diary    Why is this important?   You won't feel high blood pressure, but it can still hurt your blood vessels.  High blood pressure can cause heart or kidney problems. It can also cause a stroke.  Making lifestyle changes like losing a little weight or eating less salt will help.  Checking your blood pressure at home and at different times of the day can help to control blood pressure.  If the doctor prescribes medicine remember to take it the way the doctor ordered.  Call the office if you cannot afford the medicine or if there are questions about it.     Notes:          The patient verbalized understanding of instructions, educational materials, and care plan provided today and agreed to receive a mailed copy of patient instructions, educational materials, and care plan.   Telephone follow up appointment with pharmacy team member scheduled for: 1 month CPA  Junita Push. Kenton Kingfisher PharmD, [place] Clinical Pharmacist [phone]

## 2020-12-11 DIAGNOSIS — Z20822 Contact with and (suspected) exposure to covid-19: Secondary | ICD-10-CM | POA: Diagnosis not present

## 2020-12-13 ENCOUNTER — Emergency Department: Payer: Medicare Other

## 2020-12-13 ENCOUNTER — Emergency Department
Admission: EM | Admit: 2020-12-13 | Discharge: 2020-12-13 | Disposition: A | Discharge status: Home / Self Care | Payer: Medicare Other | Attending: Emergency Medicine | Admitting: Emergency Medicine

## 2020-12-13 ENCOUNTER — Ambulatory Visit: Payer: Self-pay | Admitting: *Deleted

## 2020-12-13 ENCOUNTER — Other Ambulatory Visit: Payer: Self-pay

## 2020-12-13 ENCOUNTER — Encounter: Payer: Self-pay | Admitting: Emergency Medicine

## 2020-12-13 DIAGNOSIS — E1122 Type 2 diabetes mellitus with diabetic chronic kidney disease: Secondary | ICD-10-CM | POA: Insufficient documentation

## 2020-12-13 DIAGNOSIS — Z8551 Personal history of malignant neoplasm of bladder: Secondary | ICD-10-CM | POA: Insufficient documentation

## 2020-12-13 DIAGNOSIS — Z7984 Long term (current) use of oral hypoglycemic drugs: Secondary | ICD-10-CM | POA: Insufficient documentation

## 2020-12-13 DIAGNOSIS — I129 Hypertensive chronic kidney disease with stage 1 through stage 4 chronic kidney disease, or unspecified chronic kidney disease: Secondary | ICD-10-CM | POA: Diagnosis not present

## 2020-12-13 DIAGNOSIS — R1084 Generalized abdominal pain: Secondary | ICD-10-CM | POA: Insufficient documentation

## 2020-12-13 DIAGNOSIS — N184 Chronic kidney disease, stage 4 (severe): Secondary | ICD-10-CM | POA: Insufficient documentation

## 2020-12-13 DIAGNOSIS — R109 Unspecified abdominal pain: Secondary | ICD-10-CM | POA: Diagnosis not present

## 2020-12-13 DIAGNOSIS — Z79899 Other long term (current) drug therapy: Secondary | ICD-10-CM | POA: Diagnosis not present

## 2020-12-13 DIAGNOSIS — Z87891 Personal history of nicotine dependence: Secondary | ICD-10-CM | POA: Diagnosis not present

## 2020-12-13 DIAGNOSIS — R112 Nausea with vomiting, unspecified: Secondary | ICD-10-CM | POA: Insufficient documentation

## 2020-12-13 LAB — COMPREHENSIVE METABOLIC PANEL
ALT: 12 U/L (ref 0–44)
AST: 18 U/L (ref 15–41)
Albumin: 4.3 g/dL (ref 3.5–5.0)
Alkaline Phosphatase: 100 U/L (ref 38–126)
Anion gap: 10 (ref 5–15)
BUN: 27 mg/dL — ABNORMAL HIGH (ref 8–23)
CO2: 24 mmol/L (ref 22–32)
Calcium: 9.7 mg/dL (ref 8.9–10.3)
Chloride: 105 mmol/L (ref 98–111)
Creatinine, Ser: 1.75 mg/dL — ABNORMAL HIGH (ref 0.44–1.00)
GFR, Estimated: 31 mL/min — ABNORMAL LOW (ref >60)
Glucose, Bld: 173 mg/dL — ABNORMAL HIGH (ref 70–99)
Potassium: 4.1 mmol/L (ref 3.5–5.1)
Sodium: 139 mmol/L (ref 135–145)
Total Bilirubin: 0.6 mg/dL (ref 0.3–1.2)
Total Protein: 8 g/dL (ref 6.5–8.1)

## 2020-12-13 LAB — CBC
HCT: 33.5 % — ABNORMAL LOW (ref 36.0–46.0)
Hemoglobin: 10.2 g/dL — ABNORMAL LOW (ref 12.0–15.0)
MCH: 23.9 pg — ABNORMAL LOW (ref 26.0–34.0)
MCHC: 30.4 g/dL (ref 30.0–36.0)
MCV: 78.6 fL — ABNORMAL LOW (ref 80.0–100.0)
Platelets: 236 10*3/uL (ref 150–400)
RBC: 4.26 MIL/uL (ref 3.87–5.11)
RDW: 14.7 % (ref 11.5–15.5)
WBC: 7.3 10*3/uL (ref 4.0–10.5)
nRBC: 0 % (ref 0.0–0.2)

## 2020-12-13 LAB — URINALYSIS, COMPLETE (UACMP) WITH MICROSCOPIC
Bilirubin Urine: NEGATIVE
Glucose, UA: NEGATIVE mg/dL
Ketones, ur: NEGATIVE mg/dL
Nitrite: NEGATIVE
Protein, ur: NEGATIVE mg/dL
Specific Gravity, Urine: 1.01 (ref 1.005–1.030)
Squamous Epithelial / HPF: NONE SEEN (ref 0–5)
pH: 6 (ref 5.0–8.0)

## 2020-12-13 LAB — LIPASE, BLOOD: Lipase: 39 U/L (ref 11–51)

## 2020-12-13 MED ORDER — SODIUM CHLORIDE 0.9 % IV BOLUS
1000.0000 mL | Freq: Once | INTRAVENOUS | Status: AC
  Administered 2020-12-13: 1000 mL via INTRAVENOUS

## 2020-12-13 MED ORDER — HEPARIN SOD (PORK) LOCK FLUSH 100 UNIT/ML IV SOLN
500.0000 [IU] | Freq: Once | INTRAVENOUS | Status: AC
  Administered 2020-12-13: 500 [IU] via INTRAVENOUS
  Filled 2020-12-13: qty 5

## 2020-12-13 MED ORDER — ONDANSETRON HCL 4 MG/2ML IJ SOLN
4.0000 mg | Freq: Once | INTRAMUSCULAR | Status: AC
  Administered 2020-12-13: 4 mg via INTRAVENOUS
  Filled 2020-12-13: qty 2

## 2020-12-13 MED ORDER — IOHEXOL 9 MG/ML PO SOLN
1000.0000 mL | Freq: Once | ORAL | Status: DC | PRN
  Administered 2020-12-13: 1000 mL via ORAL

## 2020-12-13 MED ORDER — IOHEXOL 300 MG/ML  SOLN
75.0000 mL | Freq: Once | INTRAMUSCULAR | Status: AC | PRN
  Administered 2020-12-13: 75 mL via INTRAVENOUS

## 2020-12-13 NOTE — ED Triage Notes (Signed)
C/O abdominal pain x 3 weeks  States symptoms worsened last night.  Also c/o vomiting and diarrhea.Marland Kitchen  AAOx3.  Skin warm and dry. NAD

## 2020-12-13 NOTE — ED Notes (Signed)
Pt at CT

## 2020-12-13 NOTE — ED Notes (Addendum)
PIV attempted by this RN with no success, pt requests port be accessed.

## 2020-12-13 NOTE — Discharge Instructions (Addendum)
As we discussed please use your Colace twice daily, please increase MiraLAX to 2 or 3 times daily (1 large capful each dose with a large glass of water) until you begin having multiple bowel movements.  Please call the number provided for GI medicine if you continue to have abdominal discomfort for further work-up and evaluation.  Return to the emergency department for any acute worsening of abdominal pain, fever, or any other symptom personally concerning to yourself.

## 2020-12-13 NOTE — Telephone Encounter (Signed)
Reason for Disposition  [1] MILD-MODERATE pain AND [2] constant AND [3] present > 2 hours  [1] SEVERE pain AND [2] age > 60 years    Has a "urine bag" on the side of my abd due to bladder cancer.  Whole stomach hurting and having a difficult time having BMs.  Answer Assessment - Initial Assessment Questions 1. LOCATION: "Where does it hurt?"      My whole stomach hurts. 2. RADIATION: "Does the pain shoot anywhere else?" (e.g., chest, back)     It goes into my chest like indigestion.  3. ONSET: "When did the pain begin?" (e.g., minutes, hours or days ago)      For 3 weeks.  It's intermittent. 4. SUDDEN: "Gradual or sudden onset?"     Started suddenly 5. PATTERN "Does the pain come and go, or is it constant?"    - If constant: "Is it getting better, staying the same, or worsening?"      (Note: Constant means the pain never goes away completely; most serious pain is constant and it progresses)     - If intermittent: "How long does it last?" "Do you have pain now?"     (Note: Intermittent means the pain goes away completely between bouts)     Intermittently   6. SEVERITY: "How bad is the pain?"  (e.g., Scale 1-10; mild, moderate, or severe)   - MILD (1-3): doesn't interfere with normal activities, abdomen soft and not tender to touch    - MODERATE (4-7): interferes with normal activities or awakens from sleep, abdomen tender to touch    - SEVERE (8-10): excruciating pain, doubled over, unable to do any normal activities      Moderate 7. RECURRENT SYMPTOM: "Have you ever had this type of stomach pain before?" If Yes, ask: "When was the last time?" and "What happened that time?"      No 8. CAUSE: "What do you think is causing the stomach pain?"     I have a urine bag.  It's on the side of my abd.   My whole stomach hurts.  My bowels are not moving and the laxatives and stool softeners not helping.     I had a very small BM yesterday.   9. RELIEVING/AGGRAVATING FACTORS: "What makes it better or  worse?" (e.g., movement, antacids, bowel movement)     Laxatives and stool softeners not helping. 10. OTHER SYMPTOMS: "Do you have any other symptoms?" (e.g., back pain, diarrhea, fever, urination pain, vomiting)       Vomiting.   No diarrhea.  No fever.    11. PREGNANCY: "Is there any chance you are pregnant?" "When was your last menstrual period?"       N/A due to age  Protocols used: Abdominal Pain - [facility]

## 2020-12-13 NOTE — Telephone Encounter (Signed)
FYI

## 2020-12-13 NOTE — ED Notes (Signed)
D/C and OTC meds discussed with pt, pt verbalized understanding. NAD noted on D/C.  

## 2020-12-13 NOTE — ED Provider Notes (Signed)
[facility] Emergency Department Provider Note  Time seen: 10:12 AM  I have reviewed the triage vital signs and the nursing notes.   HISTORY  Chief Complaint Abdominal pain  HPI Angela Russell is a 70 y.o. female with a past medical history of CKD, diabetes, hypertension, hyperlipidemia, presents to the emergency department for abdominal pain.  According to the patient she is status post bladder cancer still on chemotherapy every 3 weeks, also has a urostomy, states over the past 2 weeks or so she has been experiencing vague diffuse abdominal pain states mild bloating at times nausea with vomiting and diminished bowel movements.  States she will continue to have very small bowel movements but does not recall when her last normal bowel movement was.   Past Medical History:  Diagnosis Date   Carotid artery plaque, right 01/2014   CKD (chronic kidney disease)    stage 2-3   Controlled diabetes mellitus type 2 with complications (HCC)    Diabetes mellitus without complication (HCC)    DM (diabetes mellitus), type 2, uncontrolled (HCC)    Hyperlipidemia    Hypertension    Hypochromic microcytic anemia    mild   Metastatic urothelial carcinoma ([place]) 03/23/2019   Osteoporosis    Renal insufficiency    Rotator cuff tendonitis, right     Patient Active Problem List   Diagnosis Date Noted   Uncontrolled type 2 diabetes mellitus ([place]) 10/03/2020   Malignant hypertension 08/30/2020   Parastomal hernia without obstruction or gangrene 07/12/2020   Rib pain on left side 08/05/2019   Chronic anticoagulation 08/05/2019   Anemia in stage 4 chronic kidney disease ([place]) 08/05/2019   Encounter for antineoplastic immunotherapy 08/05/2019   History of DVT of lower extremity 06/26/2019   Acute deep vein thrombosis (DVT) of femoral vein of right lower extremity ([place]) 03/31/2019   DVT (deep venous thrombosis) ([place]) 03/27/2019   Metastatic urothelial carcinoma ([place])  03/23/2019   Vaginal pain 01/27/2019   Neuropathic pain 01/27/2019   Hyperkalemia 12/19/2018   Fluid collection at surgical site 10/17/2018   Tachycardia 10/15/2018   Pyelonephritis 10/14/2018   Iron deficiency anemia due to chronic blood loss 08/26/2018   AKI (acute kidney injury) ([place]) 08/06/2018   Bladder carcinoma ([place]) 08/03/2018   Goals of care, counseling/discussion 08/03/2018   Bladder tumor 07/16/2018   Hyperlipidemia    Hypertension associated with diabetes ([place])    Renal insufficiency    CKD (chronic kidney disease)    Hypochromic microcytic anemia    Chronic anemia 07/24/2016   Carotid artery plaque, right 01/02/2014   Cervical facet joint syndrome 03/20/2013   Spondylolisthesis of lumbar region 03/20/2013    Past Surgical History:  Procedure Laterality Date   ABDOMINAL HYSTERECTOMY  2000   due to bleeding and fibroids, partial- still has ovaries   CYSTOSCOPY W/ RETROGRADES Bilateral 07/16/2018   Procedure: CYSTOSCOPY WITH RETROGRADE PYELOGRAM;  Surgeon: Billey Co, MD;  Location: [facility];  Service: Urology;  Laterality: Bilateral;   IVC FILTER INSERTION N/A 03/30/2019   Procedure: IVC FILTER INSERTION;  Surgeon: Algernon Huxley, MD;  Location: [place] [facility];  Service: Cardiovascular;  Laterality: N/A;   IVC FILTER REMOVAL N/A 06/15/2019   Procedure: IVC FILTER REMOVAL;  Surgeon: Algernon Huxley, MD;  Location: [place] [facility];  Service: Cardiovascular;  Laterality: N/A;   KNEE SURGERY Left 03/17/2013   torn meniscus   PERIPHERAL VASCULAR THROMBECTOMY Right 03/30/2019   Procedure: PERIPHERAL VASCULAR THROMBECTOMY;  Surgeon: Algernon Huxley, MD;  Location: [place] [facility];  Service: Cardiovascular;  Laterality: Right;   PORTA CATH INSERTION N/A 08/06/2018   Procedure: PORTA CATH INSERTION;  Surgeon: Algernon Huxley, MD;  Location: [place] [facility];  Service: Cardiovascular;  Laterality: N/A;   TRANSURETHRAL RESECTION OF BLADDER TUMOR N/A 07/16/2018    Procedure: TRANSURETHRAL RESECTION OF BLADDER TUMOR (TURBT);  Surgeon: Billey Co, MD;  Location: [facility];  Service: Urology;  Laterality: N/A;    Prior to Admission medications   Medication Sig Start Date End Date Taking? Authorizing Provider  amLODipine (NORVASC) 5 MG tablet Take 1 tablet (5 mg total) by mouth daily. 11/04/20   [name], NP  Continuous Blood Gluc Receiver (FREESTYLE LIBRE 2 READER) DEVI 1 applicator by Does not apply route daily. Patient not taking: Reported on 11/28/2020 09/15/20   [name], NP  Continuous Blood Gluc Sensor (FREESTYLE LIBRE 2 SENSOR) MISC 1 applicator by Does not apply route daily. Patient not taking: Reported on 11/28/2020 09/15/20   [name], NP  diclofenac sodium (VOLTAREN) 1 % GEL Apply 2 g topically 4 (four) times daily. Patient taking differently: Apply 2 g topically as needed. 04/03/19   Volney American, PA-C  docusate sodium (COLACE) 100 MG capsule Take 1 capsule (100 mg total) by mouth daily. 03/22/20   [name], MD  JARDIANCE 10 MG TABS tablet Take 1 tablet by mouth once daily Patient not taking: Reported on 11/28/2020 11/04/20   [name], NP  lidocaine-prilocaine (EMLA) cream Apply 1 application topically as needed. 07/12/20   [name], MD  losartan (COZAAR) 25 MG tablet Take 1 tablet (25 mg total) by mouth daily. 08/22/20   [name], NP  pembrolizumab [facility]) 100 MG/4ML SOLN Inject 200 mg into the vein. Every 42 days,    Last txy 4/05, Next 5/03    [provider]  polyethylene glycol (MIRALAX / GLYCOLAX) 17 g packet Take 17 g by mouth daily as needed for mild constipation. 12/21/18   Vaughan Basta, MD  rosuvastatin (CRESTOR) 40 MG tablet Take 1 tablet (40 mg total) by mouth daily. 03/11/20   Eulogio Bear, NP  RYBELSUS 3 MG TABS Take 1 tablet by mouth once daily 11/04/20   [name], NP  prochlorperazine (COMPAZINE) 10 MG tablet Take 1 tablet (10 mg total) by mouth  every 6 (six) hours as needed (Nausea or vomiting). 08/03/18 08/26/18  [name], MD    No Known Allergies  Family History  Problem Relation Age of Onset   Heart disease Mother    Heart attack Mother    Arthritis Father    Diabetes Brother     Social History Social History   Tobacco Use   Smoking status: Former    Pack years: 0.00    Types: Cigarettes    Quit date: 06/05/1991    Years since quitting: 29.5   Smokeless tobacco: Never  Vaping Use   Vaping Use: Never used  Substance Use Topics   Alcohol use: No   Drug use: No    Review of Systems Constitutional: Negative for fever Cardiovascular: Negative for chest pain. Respiratory: Negative for shortness of breath. Gastrointestinal: Moderate dull abdominal pain.  Nausea vomiting.  Constipation. Genitourinary: Negative for urinary compaints Musculoskeletal: Negative for musculoskeletal complaints Neurological: Negative for headache All other ROS negative  ____________________________________________   PHYSICAL EXAM:  VITAL SIGNS: ED Triage Vitals  Enc Vitals Group     BP 12/13/20 0947 Marland Kitchen)  157/64     Pulse Rate 12/13/20 0947 87     Resp 12/13/20 0947 18     Temp 12/13/20 0947 98.5 F (36.9 C)     Temp Source 12/13/20 0947 Oral     SpO2 12/13/20 0947 98 %     Weight 12/13/20 0934 202 lb 13.2 oz (92 kg)     Height 12/13/20 0934 5\' 3"  (1.6 m)     Head Circumference --      Peak Flow --      Pain Score --      Pain Loc --      Pain Edu? --      Excl. in [place]? --    Constitutional: Alert and oriented. Well appearing and in no distress. Eyes: Normal exam ENT      Head: Normocephalic and atraumatic.      Mouth/Throat: Mucous membranes are moist. Cardiovascular: Normal rate, regular rhythm. Respiratory: Normal respiratory effort without tachypnea nor retractions. Breath sounds are clear  Gastrointestinal: Soft, mild tenderness diffusely without focal tenderness identified.  No distention.  Urostomy present with  normal-appearing output. Musculoskeletal: Nontender with normal range of motion in all extremities.  Neurologic:  Normal speech and language. No gross focal neurologic deficits  Skin:  Skin is warm, dry and intact.  Psychiatric: Mood and affect are normal.   ____________________________________________    RADIOLOGY  IMPRESSION:  1. No acute CT findings of the abdomen or pelvis to explain pain.  2. Status post cystectomy with right lower quadrant ileal conduit  urinary diversion. Mild bilateral hydronephrosis and hydroureter to  the ileal conduit, unchanged compared to prior examination.  3. Parastomal hernia containing nonobstructed loops of distal small  bowel.  4. Small volume ascites in the low pelvis, similar to prior  examination.   ____________________________________________   INITIAL IMPRESSION / ASSESSMENT AND PLAN / ED COURSE  Pertinent labs & imaging results that were available during my care of the patient were reviewed by me and considered in my medical decision making (see chart for details).   Patient presents emergency department for abdominal pain nausea vomiting constipation over the past 2 weeks or so.  Overall patient appears well, no distress.  Mild diffuse tenderness but no focal area of tenderness identified.  No rebound guarding or distention.  We will check labs and obtain a CT scan to further evaluate.  Patient is agreeable to plan of care.  Blood work is largely within normal limits, urinalysis and CT pending.  CT scan does not show any acute abnormality.  Urinalysis does not show any significant abnormality given it is a urostomy sample.  We will send a culture as a precaution.  We will discharge patient home with PCP follow-up.  Angela Russell was evaluated in Emergency Department on 12/13/2020 for the symptoms described in the history of present illness. She was evaluated in the context of the global COVID-19 pandemic, which necessitated consideration  that the patient might be at risk for infection with the SARS-CoV-2 virus that causes COVID-19. Institutional protocols and algorithms that pertain to the evaluation of patients at risk for COVID-19 are in a state of rapid change based on information released by regulatory bodies including the CDC and federal and state organizations. These policies and algorithms were followed during the patient's care in the ED.  ____________________________________________   FINAL CLINICAL IMPRESSION(S) / ED DIAGNOSES  Abdominal pain   Harvest Dark, MD 12/13/20 1256

## 2020-12-13 NOTE — ED Notes (Signed)
Pt presents to ED with c/o of generalized ABD pain that has been ongoing for 3 weeks. Pt states HX of bladder cancer. Pt states intermittent diarrhea. Pt denies fevers. Pt has a urostomy in place and states no difference in color or odor or issues with bag draining. Pt is A&Ox4. NAD noted.

## 2020-12-13 NOTE — Telephone Encounter (Signed)
Pt called in c/o her whole stomach hurting for 3 weeks now and vomiting.   Having difficulty having BMs even with laxatives and stool softeners.    She said she had a "urine bag" on her abd due to bladder cancer.   No difficulty with urinary problems when asked about that.  I have referred her to the ED.   She was going to drive however I told her to call 911 because of the severe abd pain she is having.   She said she [place] about 30 miles from the hospital.   She was agreeable and is calling 911 to take her to [facility].

## 2020-12-16 LAB — URINE CULTURE: Culture: >=100000 — AB

## 2020-12-17 NOTE — Progress Notes (Signed)
ED Antimicrobial Stewardship Positive Culture Follow Up   Angela Russell is an 70 y.o. female who presented to [facility] on 12/13/2020 with a chief complaint of No chief complaint on file. -3 weeks abd.pain, intermittant diarrhea  Recent Results (from the past 720 hour(s))  Urine Culture     Status: Abnormal   Collection Time: 12/13/20  9:49 AM   Specimen: Urine, Random  Result Value Ref Range Status   Specimen Description   Final    URINE, RANDOM Performed at [facility], [address] [place], [place] [address]    Special Requests   Final    NONE Performed at [facility], [address], [place], [place] [address]    Culture >=100,000 COLONIES/mL PROVIDENCIA RETTGERI (A)  Final   Report Status 12/16/2020 FINAL  Final   Organism ID, Bacteria [facility]ENCIA RETTGERI (A)  Final      Susceptibility   Providencia rettgeri - MIC*    AMPICILLIN RESISTANT Resistant     CEFAZOLIN >=64 RESISTANT Resistant     CEFEPIME <=0.12 SENSITIVE Sensitive     CEFTRIAXONE <=0.25 SENSITIVE Sensitive     CIPROFLOXACIN <=0.25 SENSITIVE Sensitive     GENTAMICIN <=1 SENSITIVE Sensitive     IMIPENEM 2 SENSITIVE Sensitive     NITROFURANTOIN 128 RESISTANT Resistant     TRIMETH/SULFA <=20 SENSITIVE Sensitive     AMPICILLIN/SULBACTAM <=2 SENSITIVE Sensitive     PIP/TAZO <=4 SENSITIVE Sensitive     * >=100,000 COLONIES/mL [facility]ENCIA RETTGERI     [x]  Patient discharged originally without antimicrobial agent and treatment is now indicated.   -Patient w/ hx Bladder Cancer with Urostomy, currently on chemo -Urine Cx is from Urostomy -Providencia Rettgeri produces AmpC beta-lactamase which usually causes resistance to Penicillins and 1/2nd Generation Cephlasporins. Cefpodoxime (Vantin)is a 3rd generation cephlasporin.  New antibiotic prescription: Cefpodoxime 200mg  PO Q12h x 10 days  ED Provider: Vladimir Crofts  7/16 1410 Called patient at [phone]) to determine preferred  pharmacy. Called in prescription to [facility] [phone].    Richrd Kuzniar A 12/17/2020, 2:17 PM Clinical Pharmacist

## 2020-12-27 ENCOUNTER — Encounter: Payer: Self-pay | Admitting: Oncology

## 2020-12-27 ENCOUNTER — Inpatient Hospital Stay: Payer: Medicare Other | Attending: Oncology

## 2020-12-27 ENCOUNTER — Inpatient Hospital Stay (HOSPITAL_BASED_OUTPATIENT_CLINIC_OR_DEPARTMENT_OTHER): Payer: Medicare Other | Admitting: Oncology

## 2020-12-27 ENCOUNTER — Other Ambulatory Visit: Payer: Self-pay

## 2020-12-27 ENCOUNTER — Inpatient Hospital Stay: Payer: Medicare Other

## 2020-12-27 VITALS — BP 143/71 | HR 82 | Temp 98.3°F | Resp 16 | Ht 63.0 in | Wt 208.0 lb

## 2020-12-27 DIAGNOSIS — E119 Type 2 diabetes mellitus without complications: Secondary | ICD-10-CM | POA: Insufficient documentation

## 2020-12-27 DIAGNOSIS — R635 Abnormal weight gain: Secondary | ICD-10-CM

## 2020-12-27 DIAGNOSIS — C7910 Secondary malignant neoplasm of unspecified urinary organs: Secondary | ICD-10-CM

## 2020-12-27 DIAGNOSIS — N1832 Chronic kidney disease, stage 3b: Secondary | ICD-10-CM

## 2020-12-27 DIAGNOSIS — I129 Hypertensive chronic kidney disease with stage 1 through stage 4 chronic kidney disease, or unspecified chronic kidney disease: Secondary | ICD-10-CM | POA: Diagnosis not present

## 2020-12-27 DIAGNOSIS — K435 Parastomal hernia without obstruction or  gangrene: Secondary | ICD-10-CM | POA: Insufficient documentation

## 2020-12-27 DIAGNOSIS — D631 Anemia in chronic kidney disease: Secondary | ICD-10-CM | POA: Diagnosis not present

## 2020-12-27 DIAGNOSIS — C679 Malignant neoplasm of bladder, unspecified: Secondary | ICD-10-CM | POA: Diagnosis not present

## 2020-12-27 DIAGNOSIS — Z5111 Encounter for antineoplastic chemotherapy: Secondary | ICD-10-CM | POA: Insufficient documentation

## 2020-12-27 DIAGNOSIS — Z95828 Presence of other vascular implants and grafts: Secondary | ICD-10-CM | POA: Diagnosis not present

## 2020-12-27 DIAGNOSIS — N136 Pyonephrosis: Secondary | ICD-10-CM | POA: Insufficient documentation

## 2020-12-27 DIAGNOSIS — Z5112 Encounter for antineoplastic immunotherapy: Secondary | ICD-10-CM

## 2020-12-27 LAB — CBC WITH DIFFERENTIAL/PLATELET
Abs Immature Granulocytes: 0.01 10*3/uL (ref 0.00–0.07)
Basophils Absolute: 0 10*3/uL (ref 0.0–0.1)
Basophils Relative: 1 %
Eosinophils Absolute: 0.2 10*3/uL (ref 0.0–0.5)
Eosinophils Relative: 4 %
HCT: 30.6 % — ABNORMAL LOW (ref 36.0–46.0)
Hemoglobin: 9.2 g/dL — ABNORMAL LOW (ref 12.0–15.0)
Immature Granulocytes: 0 %
Lymphocytes Relative: 25 %
Lymphs Abs: 1.2 10*3/uL (ref 0.7–4.0)
MCH: 24 pg — ABNORMAL LOW (ref 26.0–34.0)
MCHC: 30.1 g/dL (ref 30.0–36.0)
MCV: 79.9 fL — ABNORMAL LOW (ref 80.0–100.0)
Monocytes Absolute: 0.3 10*3/uL (ref 0.1–1.0)
Monocytes Relative: 7 %
Neutro Abs: 3.2 10*3/uL (ref 1.7–7.7)
Neutrophils Relative %: 63 %
Platelets: 220 10*3/uL (ref 150–400)
RBC: 3.83 MIL/uL — ABNORMAL LOW (ref 3.87–5.11)
RDW: 14.9 % (ref 11.5–15.5)
WBC: 5 10*3/uL (ref 4.0–10.5)
nRBC: 0 % (ref 0.0–0.2)

## 2020-12-27 LAB — IRON AND TIBC
Iron: 62 ug/dL (ref 28–170)
Saturation Ratios: 20 % (ref 10.4–31.8)
TIBC: 314 ug/dL (ref 250–450)
UIBC: 252 ug/dL

## 2020-12-27 LAB — FERRITIN: Ferritin: 221 ng/mL (ref 11–307)

## 2020-12-27 LAB — COMPREHENSIVE METABOLIC PANEL
ALT: 10 U/L (ref 0–44)
AST: 21 U/L (ref 15–41)
Albumin: 4 g/dL (ref 3.5–5.0)
Alkaline Phosphatase: 99 U/L (ref 38–126)
Anion gap: 10 (ref 5–15)
BUN: 27 mg/dL — ABNORMAL HIGH (ref 8–23)
CO2: 22 mmol/L (ref 22–32)
Calcium: 8.9 mg/dL (ref 8.9–10.3)
Chloride: 108 mmol/L (ref 98–111)
Creatinine, Ser: 1.57 mg/dL — ABNORMAL HIGH (ref 0.44–1.00)
GFR, Estimated: 35 mL/min — ABNORMAL LOW (ref >60)
Glucose, Bld: 139 mg/dL — ABNORMAL HIGH (ref 70–99)
Potassium: 4 mmol/L (ref 3.5–5.1)
Sodium: 140 mmol/L (ref 135–145)
Total Bilirubin: 0.5 mg/dL (ref 0.3–1.2)
Total Protein: 7.5 g/dL (ref 6.5–8.1)

## 2020-12-27 LAB — RETIC PANEL
Immature Retic Fract: 14.8 % (ref 2.3–15.9)
RBC.: 3.83 MIL/uL — ABNORMAL LOW (ref 3.87–5.11)
Retic Count, Absolute: 65.5 10*3/uL (ref 19.0–186.0)
Retic Ct Pct: 1.7 % (ref 0.4–3.1)
Reticulocyte Hemoglobin: 27 pg — ABNORMAL LOW (ref >27.9)

## 2020-12-27 MED ORDER — SODIUM CHLORIDE 0.9 % IV SOLN
Freq: Once | INTRAVENOUS | Status: AC
  Filled 2020-12-27: qty 250

## 2020-12-27 MED ORDER — HEPARIN SOD (PORK) LOCK FLUSH 100 UNIT/ML IV SOLN
500.0000 [IU] | Freq: Once | INTRAVENOUS | Status: DC
  Filled 2020-12-27: qty 5

## 2020-12-27 MED ORDER — SODIUM CHLORIDE 0.9 % IV SOLN
200.0000 mg | Freq: Once | INTRAVENOUS | Status: AC
  Administered 2020-12-27: 200 mg via INTRAVENOUS
  Filled 2020-12-27: qty 8

## 2020-12-27 MED ORDER — SODIUM CHLORIDE 0.9% FLUSH
10.0000 mL | Freq: Once | INTRAVENOUS | Status: AC
Start: 2020-12-27 — End: 2020-12-27
  Administered 2020-12-27: 10 mL via INTRAVENOUS
  Filled 2020-12-27: qty 10

## 2020-12-27 MED ORDER — HEPARIN SOD (PORK) LOCK FLUSH 100 UNIT/ML IV SOLN
INTRAVENOUS | Status: AC
  Filled 2020-12-27: qty 5

## 2020-12-27 MED ORDER — HEPARIN SOD (PORK) LOCK FLUSH 100 UNIT/ML IV SOLN
500.0000 [IU] | Freq: Once | INTRAVENOUS | Status: AC | PRN
  Administered 2020-12-27: 500 [IU]
  Filled 2020-12-27: qty 5

## 2020-12-27 NOTE — Progress Notes (Signed)
Hematology/Oncology follow up note [facility] Telephone:([phone] [phone]   Patient Care Team: [name], NP as PCP - General Nice, [name], OD (Optometry) [name], MD as Consulting Physician (Hematology and Oncology) [name], [facility] (Pharmacist)  REFERRING PROVIDER: Dr.Sninsky CHIEF COMPLAINTS/REASON FOR VISIT:  Follow up for bladder cancer, anemia.   HISTORY OF PRESENTING ILLNESS:  Angela Russell is a  70 y.o.  female with PMH listed below who was referred to me for evaluation of newly diagnosed bladder cancer. Patient initially presented to emergency room at the end of January 2020 for evaluation of dysuria, hematuria and the left lower quadrant inguinal pain and flank pain.  1/28 2020 CT renal stone study showed suspected irregular wall thickening about the superior bladder, not well assessed due to degree of bladder distention.  Recommend cystoscopy for further evaluation.  No renal stone or obstructive uropathy.  Patient was given IV Rocephin and referred patient for outpatient urology follow-up.  Urine culture was negative.  She again presented to ER after 2 days with similar symptoms.  Patient has 25-pack-year smoking history, quit approximately 20 years ago.  No family history of any urology malignancies 07/03/2018 another CT abdomen pelvis with contrast was done which showed no nephrolithiasis or hydronephrosis is identified.  Bladder is decompressed limiting evaluation.  07/16/2018.urology Dr. [name] - cystoscopy and bilateral retrograde pyelogram on 07/16/2018.  Pyelogram did not show any filling defect or abnormalities.  No hydronephrosis.  Ureteral orifice was not involved with tumor.  There is a large 5 cm posterior wall bladder tumor, bullous and sessile appearing.  Patient underwent TURBT.   Pathology: High-grade urothelial carcinoma, invasive into muscularis propria.  Lymphovascular invasion is present.  Carcinoma in situ  is also identified.  Focal squamous differentiation is noted, areas of invasive carcinoma display pleomorphic/sarcomatoid changes.  T2b  08/07/2018 CT without contrast negative.  2 subpleural right upper lobe nodule 2 to 3 mm likely benign. She also had baseline audiometry done.  08/04/2018 ddMVAC x 1 cycle, stopped due to intolerance and AKI.  Patient received 1 cycle of dd MVAC, not able to tolerate due to AKI. Patient then was referred to [facility] urology  09/22/2018 patient underwent a cystectomy, pathology pT3a N0 Mx.  11 lymph nodes were harvested and was all negative. Invasive urothelia carcinoma, high grade, with sarcomatoid features.   10/14/2018 patient was admitted due to pyelonephritis and a pelvic fluid collection.  Drain was placed. 10/23/2018 drain was removed. Patient has had difficulties getting to her appointments to [facility]. 02/09/2019, patient presented with abdominal pain. CT concerning for small bowel obstruction and increased size of right pelvic fluid collection concerning for cancer recurrence.  Right hydronephrosis to the level of pelvis and enlarged lymph nodes. Patient had JP drain placed with CT guidance to pelvic fluid collection and drained 400 cc amber fluid.  Culture was negative for growth of microorganisms and cytology was negative for malignancy.-JP drain was removed on the day of discharge. CT-guided core biopsy of pelvic lymph node adenopathy was attempted but not successful.  # Right lower extremity DVT, provoked by Transvenous biopsy  02/26/2019 transvenous biopsy by IR unsuccessful transvenous biopsy of the right pelvis mass. Post biopsy acute thrombus in the right external iliac and common femoral vein.  Patient was recommended to start anticoagulation with Xarelto however due to the co-pay, patient is not able to afford the medication. Anticoagulation regimen was switched to Eliquis 5 mg.  Patient reports that she has  been taking it once a day. 03/20/2019  PET showed FDG avid tissue in the cystectomy bed, retroperitoneal and pelvic lymphadenopathy.  Right common iliac DVT.  # increased right lower extremity swelling.  She was started on Eliquis for anticoagulation by [facility].  We clarified with her pharmacy and she was actually taking Eliquis 2.5 mg twice daily. Right lower extremity swelling has not improved but instead worsened. 03/16/2019 She had ultrasound right lower extremity done which showed persistent extensive proximal right lower extremity DVT.  Anticoagulation regimen has increased to Eliquis 5 mg twice daily.  # establish care with [facility] oncology Dr. [name] for evaluation.  Dr. [name] recommended starting immunotherapy with PD-L1 inhibitor Pembrolizumab 200 mg every 3 weeks.  The sarcomatoid histology may not respond to chemotherapy well, could portend a better chance of response into immunotherapy PET scan after 4 cycles of Keytruda showed single mildly enlarged central mesenteric lymph node in the upper pelvis with SUV 9.6.  No other abnormal hypermetabolic activity was reported.  # #Right lower extremity  femoral vein DVT provoked by transvenous biopsy in the context of cancer recurrence, Status post IV C filter and mechanical thrombectomy.  IVC filter has been removed. She is currently off anticoagulation after she developed hematuria.  # 03/30/2019 Status post IVC filter placement, mechanical thrombectomy.-IVC filter was retrieved in January 2021. # PD-L1 CPS 100%.  # 03/31/2019 started on immunotherapy Keytruda.  # 04/26/2020 CT chest abdomen pelvis was reviewed and discussed with patient. No definitive finding of disease recurrence or metastasis.  Small amount of ascites similar to prior. There is a parastomal hernia containing a small margin of adjacent small bowel.  This is associated with wall thickening in approximately 7 cm segment of bowel adjacent to the hernia.  There is adjacent abnormal stranding of the mesentery and  omentum.  # 08/19/2020, CT chest abdomen pelvis showed no evidence of new/progressive metastatic disease. NED Hold off treatment due to uncontrolled hypotension.  # Chronic abdominal discomfort due to hernia. 08/11/2020, patient was seen by Dr. Dolphus Jenny for parastomal hernia.  Patient has no obstructive symptoms and would like to continue to monitor her symptoms and hold off any intervention at this point.  Patient was recommended to wear ostomy hernia belt and was referred to [facility] wound ostomy nursing team.   [place] is a 70 y.o. female who has above history reviewed by me today presents for follow up visit for evaluation form metastatic high-grade urothelial carcinoma of the bladder. Sarcomatoid feature.  Patient has been on maintenance immunotherapy.  With Keytruda She is currently on Keytruda every 4 weeks per patient's request. Patient reports having nausea vomiting, abdominal pain during interval and presented to emergency room for evaluation. 12/13/2020, 1. No acute CT findings of the abdomen or pelvis to explain pain. 2. Status post cystectomy with right lower quadrant ileal conduit urinary diversion. Mild bilateral hydronephrosis and hydroureter to the ileal conduit, unchanged compared to prior examination. 3. Parastomal hernia containing nonobstructed loops of distal small bowel. 4. Small volume ascites in the low pelvis, similar to prior examination.  Urine culture was positive for procidentia rettgeri and the patient was prescribed a 10 days course of cefpodoxime.  Patient reports that today her symptoms are all resolved.  No nausea vomiting abdominal pain.  Also reports blurred vision  Review of Systems  Constitutional:  Negative for appetite change, chills, fatigue and fever.  HENT:   Negative for hearing loss and voice change.   Eyes:  Negative for eye problems.  Respiratory:  Negative for chest tightness and cough.   Cardiovascular:  Negative for chest  pain and leg swelling.  Gastrointestinal:  Positive for constipation. Negative for abdominal distention, abdominal pain and blood in stool.  Endocrine: Negative for hot flashes.  Genitourinary:  Negative for difficulty urinating and frequency.   Musculoskeletal:  Negative for arthralgias.  Skin:  Negative for itching and rash.  Neurological:  Positive for numbness. Negative for extremity weakness.  Hematological:  Negative for adenopathy.  Psychiatric/Behavioral:  Negative for confusion. The patient is not nervous/anxious.    MEDICAL HISTORY:  Past Medical History:  Diagnosis Date   Carotid artery plaque, right 01/2014   CKD (chronic kidney disease)    stage 2-3   Controlled diabetes mellitus type 2 with complications (HCC)    Diabetes mellitus without complication (HCC)    DM (diabetes mellitus), type 2, uncontrolled (HCC)    Hyperlipidemia    Hypertension    Hypochromic microcytic anemia    mild   Metastatic urothelial carcinoma ([place]) 03/23/2019   Osteoporosis    Renal insufficiency    Rotator cuff tendonitis, right     SURGICAL HISTORY: Past Surgical History:  Procedure Laterality Date   ABDOMINAL HYSTERECTOMY  2000   due to bleeding and fibroids, partial- still has ovaries   CYSTOSCOPY W/ RETROGRADES Bilateral 07/16/2018   Procedure: CYSTOSCOPY WITH RETROGRADE PYELOGRAM;  Surgeon: Billey Co, MD;  Location: [facility] ORS;  Service: Urology;  Laterality: Bilateral;   IVC FILTER INSERTION N/A 03/30/2019   Procedure: IVC FILTER INSERTION;  Surgeon: Algernon Huxley, MD;  Location: [place] [facility];  Service: Cardiovascular;  Laterality: N/A;   IVC FILTER REMOVAL N/A 06/15/2019   Procedure: IVC FILTER REMOVAL;  Surgeon: Algernon Huxley, MD;  Location: [place] [facility];  Service: Cardiovascular;  Laterality: N/A;   KNEE SURGERY Left 03/17/2013   torn meniscus   PERIPHERAL VASCULAR THROMBECTOMY Right 03/30/2019   Procedure: PERIPHERAL VASCULAR THROMBECTOMY;  Surgeon: Algernon Huxley, MD;  Location: [place] [facility];  Service: Cardiovascular;  Laterality: Right;   PORTA CATH INSERTION N/A 08/06/2018   Procedure: PORTA CATH INSERTION;  Surgeon: Algernon Huxley, MD;  Location: [place] [facility];  Service: Cardiovascular;  Laterality: N/A;   TRANSURETHRAL RESECTION OF BLADDER TUMOR N/A 07/16/2018   Procedure: TRANSURETHRAL RESECTION OF BLADDER TUMOR (TURBT);  Surgeon: Billey Co, MD;  Location: [facility] ORS;  Service: Urology;  Laterality: N/A;    SOCIAL HISTORY: Social History   Socioeconomic History   Marital status: Single    Spouse name: Not on file   Number of children: 1   Years of education: Not on file   Highest education level: 10th grade  Occupational History   Not on file  Tobacco Use   Smoking status: Former    Types: Cigarettes    Quit date: 06/05/1991    Years since quitting: 29.5   Smokeless tobacco: Never  Vaping Use   Vaping Use: Never used  Substance and Sexual Activity   Alcohol use: No   Drug use: No   Sexual activity: Not Currently  Other Topics Concern   Not on file  Social History Narrative   Working full time   Social Determinants of Radio broadcast assistant Strain: Low Risk    Difficulty of Paying Living Expenses: Not hard at all  Food Insecurity: Not on file  Transportation Needs: Not on file  Physical Activity: Not on file  Stress: Not on file  Social Connections: Not on file  Intimate Partner Violence: Not on file    FAMILY HISTORY: Family History  Problem Relation Age of Onset   Heart disease Mother    Heart attack Mother    Arthritis Father    Diabetes Brother     ALLERGIES:  has No Known Allergies.  MEDICATIONS:  Current Outpatient Medications  Medication Sig Dispense Refill   amLODipine (NORVASC) 5 MG tablet Take 1 tablet (5 mg total) by mouth daily. 90 tablet 1   cefpodoxime (VANTIN) 200 MG tablet SMARTSIG:1 Tablet(s) By Mouth Every 12 Hours     diclofenac sodium (VOLTAREN) 1 % GEL Apply 2 g  topically 4 (four) times daily. (Patient taking differently: Apply 2 g topically as needed.) 100 g 2   docusate sodium (COLACE) 100 MG capsule Take 1 capsule (100 mg total) by mouth daily. 90 capsule 0   lidocaine-prilocaine (EMLA) cream Apply 1 application topically as needed. 30 g 3   losartan (COZAAR) 25 MG tablet Take 1 tablet (25 mg total) by mouth daily. 90 tablet 0   pembrolizumab (KEYTRUDA) 100 MG/4ML SOLN Inject 200 mg into the vein. Every 42 days,    Last txy 4/05, Next 5/03     polyethylene glycol (MIRALAX / GLYCOLAX) 17 g packet Take 17 g by mouth daily as needed for mild constipation. 14 each 0   rosuvastatin (CRESTOR) 40 MG tablet Take 1 tablet (40 mg total) by mouth daily. 90 tablet 1   RYBELSUS 3 MG TABS Take 1 tablet by mouth once daily 90 tablet 1   No current facility-administered medications for this visit.     PHYSICAL EXAMINATION: ECOG PERFORMANCE STATUS: 1 - Symptomatic but completely ambulatory Vitals:   12/27/20 0901  BP: (!) 143/71  Pulse: 82  Resp: 16  Temp: 98.3 F (36.8 C)  SpO2: 100%   Filed Weights   12/27/20 0901  Weight: 208 lb (94.3 kg)    Physical Exam Constitutional:      General: She is not in acute distress.    Comments: Walk independently  HENT:     Head: Normocephalic and atraumatic.  Eyes:     General: No scleral icterus.    Pupils: Pupils are equal, round, and reactive to light.  Cardiovascular:     Rate and Rhythm: Normal rate and regular rhythm.     Heart sounds: Normal heart sounds.  Pulmonary:     Effort: Pulmonary effort is normal. No respiratory distress.     Breath sounds: No wheezing.  Abdominal:     General: Bowel sounds are normal. There is no distension.     Palpations: Abdomen is soft. There is no mass.     Comments: Ureterostomy,   Musculoskeletal:        General: No deformity. Normal range of motion.     Cervical back: Normal range of motion and neck supple.  Skin:    General: Skin is warm and dry.      Coloration: Skin is not pale.     Findings: No erythema or rash.  Neurological:     Mental Status: She is alert and oriented to person, place, and time. Mental status is at baseline.     Cranial Nerves: No cranial nerve deficit.     Coordination: Coordination normal.  Psychiatric:        Mood and Affect: Mood normal.  .  RADIOGRAPHIC STUDIES: I have personally reviewed the radiological images as listed and agreed  with the findings in the report.  CMP Latest Ref Rng & Units 12/27/2020  Glucose 70 - 99 mg/dL 139(H)  BUN 8 - 23 mg/dL 27(H)  Creatinine 0.44 - 1.00 mg/dL 1.57(H)  Sodium 135 - 145 mmol/L 140  Potassium 3.5 - 5.1 mmol/L 4.0  Chloride 98 - 111 mmol/L 108  CO2 22 - 32 mmol/L 22  Calcium 8.9 - 10.3 mg/dL 8.9  Total Protein 6.5 - 8.1 g/dL 7.5  Total Bilirubin 0.3 - 1.2 mg/dL 0.5  Alkaline Phos 38 - 126 U/L 99  AST 15 - 41 U/L 21  ALT 0 - 44 U/L 10   CBC Latest Ref Rng & Units 12/27/2020  WBC 4.0 - 10.5 K/uL 5.0  Hemoglobin 12.0 - 15.0 g/dL 9.2(L)  Hematocrit 36.0 - 46.0 % 30.6(L)  Platelets 150 - 400 K/uL 220    LABORATORY DATA:  I have reviewed the data as listed Lab Results  Component Value Date   WBC 5.0 12/27/2020   HGB 9.2 (L) 12/27/2020   HCT 30.6 (L) 12/27/2020   MCV 79.9 (L) 12/27/2020   PLT 220 12/27/2020   Recent Labs    02/09/20 0832 02/16/20 1307 03/01/20 1301 03/22/20 0846 11/29/20 0841 12/13/20 0949 12/27/20 0838  NA 138 139 140   < > 139 139 140  K 4.4 4.6 4.6   < > 4.2 4.1 4.0  CL 104 107 108   < > 107 105 108  CO2 21* 20* 22   < > _0 GLUCOSE 228* 163* 190*   < > 141* 173* 139*  BUN 41* 49* 33*   < > 32* 27* 27*  CREATININE 2.35* 2.41* 2.08*   < > 1.57* 1.75* 1.57*  CALCIUM 9.0 9.3 9.3   < > 8.9 9.7 8.9  GFRNONAA 20* 20* 24*   < > 35* 31* 35*  GFRAA 24* 23* 27*  --   --   --   --   PROT 7.9 8.6* 7.8   < > 7.3 8.0 7.5  ALBUMIN 4.3 4.5 4.3   < > 3.9 4.3 4.0  AST _1 < > _2 ALT _3 < > _4 ALKPHOS  100 86 87   < > 104 100 99  BILITOT 0.6 0.5 0.5   < > 0.5 0.6 0.5   < > = values in this interval not displayed.    Iron/TIBC/Ferritin/ %Sat    Component Value Date/Time   IRON 62 12/27/2020 0838   IRON 26 (L) 02/04/2019 1309   TIBC 314 12/27/2020 0838   TIBC 251 02/04/2019 1309   FERRITIN 221 12/27/2020 0838   FERRITIN 409 (H) 02/04/2019 1309   IRONPCTSAT 20 12/27/2020 0838   IRONPCTSAT 10 (L) 02/04/2019 1309    RADIOGRAPHIC STUDIES: I have personally reviewed the radiological images as listed and agreed with the findings in the report. CT ABDOMEN PELVIS W CONTRAST  Result Date: 12/13/2020 CLINICAL DATA:  Generalized abdominal pain for 3 weeks, history of bladder cancer EXAM: CT ABDOMEN AND PELVIS WITH CONTRAST TECHNIQUE: Multidetector CT imaging of the abdomen and pelvis was performed using the standard protocol following bolus administration of intravenous contrast. CONTRAST:  56m OMNIPAQUE IOHEXOL 300 MG/ML SOLN, additional oral enteric contrast COMPARISON:  08/19/2020 FINDINGS: Lower chest: No acute abnormality. Hepatobiliary: No solid liver abnormality is seen. No gallstones, gallbladder wall thickening, or biliary dilatation. Pancreas: Unremarkable. No pancreatic ductal dilatation or surrounding  inflammatory changes. Spleen: Normal in size without significant abnormality. Adrenals/Urinary Tract: Adrenal glands are unremarkable. Status post cystectomy with right lower quadrant ileal conduit urinary diversion. Mild bilateral hydronephrosis and hydroureter to the ileal conduit, unchanged compared to prior. Stomach/Bowel: Stomach is within normal limits. Appendix appears normal. No evidence of bowel wall thickening, distention, or inflammatory changes. Vascular/Lymphatic: Aortic atherosclerosis. Right common and external iliac vein stent. No enlarged abdominal or pelvic lymph nodes. Reproductive: Status post hysterectomy. Other: Right lower quadrant ileal conduit urinary diversion with a  parastomal hernia containing nonobstructed loops of distal small bowel. Small volume ascites in the low pelvis, similar to prior examination. Musculoskeletal: No acute or significant osseous findings. IMPRESSION: 1. No acute CT findings of the abdomen or pelvis to explain pain. 2. Status post cystectomy with right lower quadrant ileal conduit urinary diversion. Mild bilateral hydronephrosis and hydroureter to the ileal conduit, unchanged compared to prior examination. 3. Parastomal hernia containing nonobstructed loops of distal small bowel. 4. Small volume ascites in the low pelvis, similar to prior examination. Aortic Atherosclerosis (ICD10-I70.0). Electronically Signed   By: Eddie Candle M.D.   On: 12/13/2020 12:09      ASSESSMENT & PLAN:  1. Metastatic urothelial carcinoma ([place])   2. Anemia in stage 3b chronic kidney disease ([place])   3. Port-A-Cath in place   4. Encounter for antineoplastic immunotherapy   5. Weight gain   6. Parastomal hernia without obstruction or gangrene   Cancer Staging Bladder carcinoma [facility]) Staging form: Urinary Bladder, AJCC 8th Edition - Clinical stage from 08/01/2018: Stage IVA (cTX, cN3, cM1a) - Signed by [name], MD on 04/21/2019  #Metastatic urothelial carcinoma of bladder, sarcomatoid features pelvic sidewall mass biopsy showed metastatic carcinoma consistent with involvement by urothelial carcinoma., Stage IV NED- PET 09/01/19 Labs are reviewed and discussed with patient.  Proceed with Keytruda 200 mg. Patient prefers to receive Keytruda 200 mg every 4 weeks instead of every 3 weeks due to her working schedule.  I discussed with patient about Keytruda 400 mg every 6 weeks and patient declines.  She prefers to stay on Keytruda 200 mg every 4 weeks knowing that this is not a standard dose. Recent CT scan was reviewed and discussed with patient.  No cancer progression.  Continue current treatment plan.  #UTI, finish current course of antibiotics. #Hernia  -parastomal hernia.   She has establish care with [facility].  She was recommended to monitor her symptoms and aware ostomy belt  #Chronic kidney disease, creatinine has improved to her baseline.  Encourage oral hydration.  Avoid nephrotoxin.  #Chronic anemia secondary to stage III CKD.  Hemoglobin has further decreased to 9.2 today.   Patient previously agreed with proceeding with another dose of Venofer which is scheduled today.  She has changed her mind and we will hold off Venofer treatments for now.   #History of diabetes. A1c few months ago was 8.4 History of diabetes continue follow-up with primary care provider for treatment.  on semaglutide.  Recommend patient to contact primary care provider as well as ophthalmologist for evaluation of blurry vision.  Questionable side effects from medication.  #Weight gain.  TSH has been monitored and has been normal.  She has been referred to establish care with dietitian and no showed.   #Follow-up in 4 weeks for repeat blood work, MD evaluation and Keytruda and Venofer.  Patient prefers every 4 weeks treatment versus every 3 weeks.  .All questions were answered. The patient knows to call the  clinic with any problems questions or concerns.  [name], MD, PhD Hematology Oncology [facility] at [facility] Pager- [phone] 12/27/20

## 2020-12-27 NOTE — Patient Instructions (Addendum)
[facility]  Discharge Instructions: Thank you for choosing [facility] to provide your oncology and hematology care.  If you have a lab appointment with the Cancer Center, please go directly to the Cancer Center and check in at the registration area.  Wear comfortable clothing and clothing appropriate for easy access to any Portacath or PICC line.   We strive to give you quality time with your provider. You may need to reschedule your appointment if you arrive late (15 or more minutes).  Arriving late affects you and other patients whose appointments are after yours.  Also, if you miss three or more appointments without notifying the office, you may be dismissed from the clinic at the provider's discretion.      For prescription refill requests, have your pharmacy contact our office and allow 72 hours for refills to be completed.    Today you received the following chemotherapy and/or immunotherapy agents Keytruda      To help prevent nausea and vomiting after your treatment, we encourage you to take your nausea medication as directed.  BELOW ARE SYMPTOMS THAT SHOULD BE REPORTED IMMEDIATELY: *FEVER GREATER THAN 100.4 F (38 C) OR HIGHER *CHILLS OR SWEATING *NAUSEA AND VOMITING THAT IS NOT CONTROLLED WITH YOUR NAUSEA MEDICATION *UNUSUAL SHORTNESS OF BREATH *UNUSUAL BRUISING OR BLEEDING *URINARY PROBLEMS (pain or burning when urinating, or frequent urination) *BOWEL PROBLEMS (unusual diarrhea, constipation, pain near the anus) TENDERNESS IN MOUTH AND THROAT WITH OR WITHOUT PRESENCE OF ULCERS (sore throat, sores in mouth, or a toothache) UNUSUAL RASH, SWELLING OR PAIN  UNUSUAL VAGINAL DISCHARGE OR ITCHING   Items with * indicate a potential emergency and should be followed up as soon as possible or go to the Emergency Department if any problems should occur.  Please show the CHEMOTHERAPY ALERT CARD or IMMUNOTHERAPY ALERT CARD at check-in to  the Emergency Department and triage nurse.  Should you have questions after your visit or need to cancel or reschedule your appointment, please contact [facility]  [phone] and follow the prompts.  Office hours are 8:00 a.m. to 4:30 p.m. Monday - Friday. Please note that voicemails left after 4:00 p.m. may not be returned until the following business day.  We are closed weekends and major holidays. You have access to a nurse at all times for urgent questions. Please call the main number to the clinic [phone] and follow the prompts.  For any non-urgent questions, you may also contact your provider using MyChart. We now offer e-Visits for anyone 18 and older to request care online for non-urgent symptoms. For details visit [email or web address].   Also download the MyChart app! Go to the app store, search "MyChart", open the app, select [facility], and log in with your MyChart username and password.  Due to Covid, a mask is required upon entering the hospital/clinic. If you do not have a mask, one will be given to you upon arrival. For doctor visits, patients may have 1 support person aged 18 or older with them. For treatment visits, patients cannot have anyone with them due to current Covid guidelines and our immunocompromised population. Pembrolizumab injection What is this medication? PEMBROLIZUMAB (pem broe liz ue mab) is a monoclonal antibody. It is used totreat certain types of cancer. This medicine may be used for other purposes; ask your health care provider orpharmacist if you have questions. COMMON BRAND NAME(S): Keytruda What should I tell my care team before I take this   medication? They need to know if you have any of these conditions: autoimmune diseases like Crohn's disease, ulcerative colitis, or lupus have had or planning to have an allogeneic stem cell transplant (uses someone else's stem cells) history of organ transplant history of  chest radiation nervous system problems like myasthenia gravis or Guillain-Barre syndrome an unusual or allergic reaction to pembrolizumab, other medicines, foods, dyes, or preservatives pregnant or trying to get pregnant breast-feeding How should I use this medication? This medicine is for infusion into a vein. It is given by a health careprofessional in a hospital or clinic setting. A special MedGuide will be given to you before each treatment. Be sure to readthis information carefully each time. Talk to your pediatrician regarding the use of this medicine in children. While this drug may be prescribed for children as young as 6 months for selectedconditions, precautions do apply. Overdosage: If you think you have taken too much of this medicine contact apoison control center or emergency room at once. NOTE: This medicine is only for you. Do not share this medicine with others. What if I miss a dose? It is important not to miss your dose. Call your doctor or health careprofessional if you are unable to keep an appointment. What may interact with this medication? Interactions have not been studied. This list may not describe all possible interactions. Give your health care provider a list of all the medicines, herbs, non-prescription drugs, or dietary supplements you use. Also tell them if you smoke, drink alcohol, or use illegaldrugs. Some items may interact with your medicine. What should I watch for while using this medication? Your condition will be monitored carefully while you are receiving thismedicine. You may need blood work done while you are taking this medicine. Do not become pregnant while taking this medicine or for 4 months after stopping it. Women should inform their doctor if they wish to become pregnant or think they might be pregnant. There is a potential for serious side effects to an unborn child. Talk to your health care professional or pharmacist for more information. Do  not breast-feed an infant while taking this medicine orfor 4 months after the last dose. What side effects may I notice from receiving this medication? Side effects that you should report to your doctor or health care professionalas soon as possible: allergic reactions like skin rash, itching or hives, swelling of the face, lips, or tongue bloody or black, tarry breathing problems changes in vision chest pain chills confusion constipation cough diarrhea dizziness or feeling faint or lightheaded fast or irregular heartbeat fever flushing joint pain low blood counts - this medicine may decrease the number of white blood cells, red blood cells and platelets. You may be at increased risk for infections and bleeding. muscle pain muscle weakness pain, tingling, numbness in the hands or feet persistent headache redness, blistering, peeling or loosening of the skin, including inside the mouth signs and symptoms of high blood sugar such as dizziness; dry mouth; dry skin; fruity breath; nausea; stomach pain; increased hunger or thirst; increased urination signs and symptoms of kidney injury like trouble passing urine or change in the amount of urine signs and symptoms of liver injury like dark urine, light-colored stools, loss of appetite, nausea, right upper belly pain, yellowing of the eyes or skin sweating swollen lymph nodes weight loss Side effects that usually do not require medical attention (report to yourdoctor or health care professional if they continue or are bothersome): decreased appetite hair loss tiredness   This list may not describe all possible side effects. Call your doctor for medical advice about side effects. You may report side effects to FDA at1-800-FDA-1088. Where should I keep my medication? This drug is given in a hospital or clinic and will not be stored at home. NOTE: This sheet is a summary. It may not cover all possible information. If you have questions about  this medicine, talk to your doctor, pharmacist, orhealth care provider.  2022 Elsevier/Gold Standard (2019-04-22 21:44:53)  

## 2021-01-05 NOTE — Progress Notes (Signed)
BP 138/74   Pulse 93   Wt 208 lb 3.2 oz (94.4 kg)   SpO2 99%   BMI 36.88 kg/m    Subjective:    Patient ID: Angela Russell, female    DOB: 25-Mar-1951, 70 y.o.   MRN: [id number]  HPI: Angela Russell is a 70 y.o. female  Chief Complaint  Patient presents with   Hyperlipidemia   Hypertension   Diabetes   HYPERTENSION Hypertension status: controlled  Satisfied with current treatment? yes Duration of hypertension: years BP monitoring frequency:  rarely BP range: 130/70 BP medication side effects:  no Medication compliance: excellent compliance Previous BP meds:amlodipine and losartan (cozaar) Aspirin: no Recurrent headaches: no Visual changes: no Palpitations: no Dyspnea: no Chest pain: no Lower extremity edema: no Dizzy/lightheaded: no  DIABETES Hypoglycemic episodes:no Polydipsia/polyuria: no Visual disturbance: no Chest pain: no Paresthesias: no Glucose Monitoring: no  Accucheck frequency: Daily  Fasting glucose: 130  Post prandial:  Evening:  Before meals: Taking Insulin?: no  Long acting insulin:  Short acting insulin: Blood Pressure Monitoring: rarely Retinal Examination: Up to Date Foot Exam: Up to Date Diabetic Education: Not Completed Pneumovax: Up to Date Influenza: Up to Date Aspirin: yes  CHRONIC KIDNEY DISEASE CKD status: controlled Medications renally dose: yes Previous renal evaluation: yes Pneumovax:  Up to Date Influenza Vaccine:  Up to Date  ANEMIA Anemia status: stable Etiology of anemia:CKD Duration of anemia treatment:  Compliance with treatment: excellent compliance Iron supplementation side effects: no Severity of anemia: moderate Fatigue: yes Decreased exercise tolerance: no  Dyspnea on exertion: no Palpitations: no Bleeding: no Pica: no  Relevant past medical, surgical, family and social history reviewed and updated as indicated. Interim medical history since our last visit reviewed. Allergies and medications  reviewed and updated.  Review of Systems  Eyes:  Negative for visual disturbance.  Respiratory:  Negative for cough, chest tightness and shortness of breath.   Cardiovascular:  Negative for chest pain, palpitations and leg swelling.  Endocrine: Negative for polydipsia and polyuria.  Neurological:  Negative for dizziness, numbness and headaches.   Per HPI unless specifically indicated above     Objective:    BP 138/74   Pulse 93   Wt 208 lb 3.2 oz (94.4 kg)   SpO2 99%   BMI 36.88 kg/m   Wt Readings from Last 3 Encounters:  01/06/21 208 lb 3.2 oz (94.4 kg)  12/27/20 208 lb (94.3 kg)  12/13/20 202 lb 13.2 oz (92 kg)    Physical Exam Vitals and nursing note reviewed.  Constitutional:      General: She is not in acute distress.    Appearance: Normal appearance. She is normal weight. She is not ill-appearing, toxic-appearing or diaphoretic.  HENT:     Head: Normocephalic.     Right Ear: External ear normal.     Left Ear: External ear normal.     Nose: Nose normal.     Mouth/Throat:     Mouth: Mucous membranes are moist.     Pharynx: Oropharynx is clear.  Eyes:     General:        Right eye: No discharge.        Left eye: No discharge.     Extraocular Movements: Extraocular movements intact.     Conjunctiva/sclera: Conjunctivae normal.     Pupils: Pupils are equal, round, and reactive to light.  Cardiovascular:     Rate and Rhythm: Normal rate and regular rhythm.  Heart sounds: No murmur heard. Pulmonary:     Effort: Pulmonary effort is normal. No respiratory distress.     Breath sounds: Normal breath sounds. No wheezing or rales.  Musculoskeletal:     Cervical back: Normal range of motion and neck supple.  Skin:    General: Skin is warm and dry.     Capillary Refill: Capillary refill takes less than 2 seconds.  Neurological:     General: No focal deficit present.     Mental Status: She is alert and oriented to person, place, and time. Mental status is at  baseline.  Psychiatric:        Mood and Affect: Mood normal.        Behavior: Behavior normal.        Thought Content: Thought content normal.        Judgment: Judgment normal.    Results for orders placed or performed in visit on 12/27/20  Retic Panel  Result Value Ref Range   Retic Ct Pct 1.7 0.4 - 3.1 %   RBC. 3.83 (L) 3.87 - 5.11 MIL/uL   Retic Count, Absolute 65.5 19.0 - 186.0 K/uL   Immature Retic Fract 14.8 2.3 - 15.9 %   Reticulocyte Hemoglobin 27.0 (L) >27.9 pg  Iron and TIBC  Result Value Ref Range   Iron 62 28 - 170 ug/dL   TIBC 314 250 - 450 ug/dL   Saturation Ratios 20 10.4 - 31.8 %   UIBC 252 ug/dL  Ferritin  Result Value Ref Range   Ferritin 221 11 - 307 ng/mL  Comprehensive metabolic panel  Result Value Ref Range   Sodium 140 135 - 145 mmol/L   Potassium 4.0 3.5 - 5.1 mmol/L   Chloride 108 98 - 111 mmol/L   CO2 22 22 - 32 mmol/L   Glucose, Bld 139 (H) 70 - 99 mg/dL   BUN 27 (H) 8 - 23 mg/dL   Creatinine, Ser 1.57 (H) 0.44 - 1.00 mg/dL   Calcium 8.9 8.9 - 10.3 mg/dL   Total Protein 7.5 6.5 - 8.1 g/dL   Albumin 4.0 3.5 - 5.0 g/dL   AST 21 15 - 41 U/L   ALT 10 0 - 44 U/L   Alkaline Phosphatase 99 38 - 126 U/L   Total Bilirubin 0.5 0.3 - 1.2 mg/dL   GFR, Estimated 35 (L) >60 mL/min   Anion gap 10 5 - 15  CBC with Differential/Platelet  Result Value Ref Range   WBC 5.0 4.0 - 10.5 K/uL   RBC 3.83 (L) 3.87 - 5.11 MIL/uL   Hemoglobin 9.2 (L) 12.0 - 15.0 g/dL   HCT 30.6 (L) 36.0 - 46.0 %   MCV 79.9 (L) 80.0 - 100.0 fL   MCH 24.0 (L) 26.0 - 34.0 pg   MCHC 30.1 30.0 - 36.0 g/dL   RDW 14.9 11.5 - 15.5 %   Platelets 220 150 - 400 K/uL   nRBC 0.0 0.0 - 0.2 %   Neutrophils Relative % 63 %   Neutro Abs 3.2 1.7 - 7.7 K/uL   Lymphocytes Relative 25 %   Lymphs Abs 1.2 0.7 - 4.0 K/uL   Monocytes Relative 7 %   Monocytes Absolute 0.3 0.1 - 1.0 K/uL   Eosinophils Relative 4 %   Eosinophils Absolute 0.2 0.0 - 0.5 K/uL   Basophils Relative 1 %   Basophils  Absolute 0.0 0.0 - 0.1 K/uL   Immature Granulocytes 0 %   Abs Immature Granulocytes 0.01 0.00 -  0.07 K/uL      Assessment & Plan:   Problem List Items Addressed This Visit       Cardiovascular and Mediastinum   Hypertension associated with diabetes ([place]) - Primary    Chronic.  Controlled.  Continue with current medication regimen.  Labs ordered today.  Return to clinic in 6 months for reevaluation.  Call sooner if concerns arise.         Relevant Orders   Comp Met (CMET)     Endocrine   Uncontrolled type 2 diabetes mellitus (HCC)    Chronic.  Controlled.  Continue with current medication regimen.  Labs ordered today.  Return to clinic in 6 months for reevaluation.  Call sooner if concerns arise.         Relevant Orders   Comp Met (CMET)   HgB A1c     Genitourinary   CKD (chronic kidney disease)    Chronic.  Controlled.  Continue with current medication regimen.  Labs ordered today. Continue to follow up with Nephrology.  Return to clinic in 6 months for reevaluation.  Call sooner if concerns arise.         Relevant Orders   Comp Met (CMET)   Anemia in stage 4 chronic kidney disease (HCC)    Chronic.  Controlled.  Continue with current medication regimen.  Labs ordered today.  Return to clinic in 6 months for reevaluation.  Call sooner if concerns arise.         Relevant Orders   CBC w/Diff     Follow up plan: Return in about 6 months (around 07/09/2021) for HTN, HLD, DM2 FU.

## 2021-01-06 ENCOUNTER — Encounter: Payer: Self-pay | Admitting: Nurse Practitioner

## 2021-01-06 ENCOUNTER — Other Ambulatory Visit: Payer: Self-pay

## 2021-01-06 ENCOUNTER — Ambulatory Visit (INDEPENDENT_AMBULATORY_CARE_PROVIDER_SITE_OTHER): Payer: Medicare Other | Admitting: Nurse Practitioner

## 2021-01-06 VITALS — BP 138/74 | HR 93 | Wt 208.2 lb

## 2021-01-06 DIAGNOSIS — N1832 Chronic kidney disease, stage 3b: Secondary | ICD-10-CM | POA: Diagnosis not present

## 2021-01-06 DIAGNOSIS — E1165 Type 2 diabetes mellitus with hyperglycemia: Secondary | ICD-10-CM | POA: Diagnosis not present

## 2021-01-06 DIAGNOSIS — D631 Anemia in chronic kidney disease: Secondary | ICD-10-CM | POA: Diagnosis not present

## 2021-01-06 DIAGNOSIS — N184 Chronic kidney disease, stage 4 (severe): Secondary | ICD-10-CM

## 2021-01-06 DIAGNOSIS — E1159 Type 2 diabetes mellitus with other circulatory complications: Secondary | ICD-10-CM

## 2021-01-06 DIAGNOSIS — I152 Hypertension secondary to endocrine disorders: Secondary | ICD-10-CM | POA: Diagnosis not present

## 2021-01-06 NOTE — Assessment & Plan Note (Signed)
Chronic.  Controlled.  Continue with current medication regimen.  Labs ordered today.  Return to clinic in 6 months for reevaluation.  Call sooner if concerns arise.  ? ?

## 2021-01-06 NOTE — Assessment & Plan Note (Signed)
Chronic.  Controlled.  Continue with current medication regimen.  Labs ordered today. Continue to follow up with Nephrology.  Return to clinic in 6 months for reevaluation.  Call sooner if concerns arise.

## 2021-01-07 ENCOUNTER — Encounter: Payer: Self-pay | Admitting: Oncology

## 2021-01-07 LAB — COMPREHENSIVE METABOLIC PANEL
ALT: 7 IU/L (ref 0–32)
AST: 14 IU/L (ref 0–40)
Albumin/Globulin Ratio: 1.5 (ref 1.2–2.2)
Albumin: 4.4 g/dL (ref 3.7–4.7)
Alkaline Phosphatase: 136 IU/L — ABNORMAL HIGH (ref 44–121)
BUN/Creatinine Ratio: 13 (ref 12–28)
BUN: 24 mg/dL (ref 8–27)
Bilirubin Total: 0.2 mg/dL (ref 0.0–1.2)
CO2: 20 mmol/L (ref 20–29)
Calcium: 9.4 mg/dL (ref 8.7–10.3)
Chloride: 108 mmol/L — ABNORMAL HIGH (ref 96–106)
Creatinine, Ser: 1.88 mg/dL — ABNORMAL HIGH (ref 0.57–1.00)
Globulin, Total: 2.9 g/dL (ref 1.5–4.5)
Glucose: 133 mg/dL — ABNORMAL HIGH (ref 65–99)
Potassium: 4.6 mmol/L (ref 3.5–5.2)
Sodium: 142 mmol/L (ref 134–144)
Total Protein: 7.3 g/dL (ref 6.0–8.5)
eGFR: 28 mL/min/{1.73_m2} — ABNORMAL LOW (ref >59)

## 2021-01-07 LAB — CBC WITH DIFFERENTIAL/PLATELET
Basophils Absolute: 0 10*3/uL (ref 0.0–0.2)
Basos: 1 %
EOS (ABSOLUTE): 0.2 10*3/uL (ref 0.0–0.4)
Eos: 3 %
Hematocrit: 32.2 % — ABNORMAL LOW (ref 34.0–46.6)
Hemoglobin: 9.7 g/dL — ABNORMAL LOW (ref 11.1–15.9)
Immature Grans (Abs): 0 10*3/uL (ref 0.0–0.1)
Immature Granulocytes: 0 %
Lymphocytes Absolute: 1.1 10*3/uL (ref 0.7–3.1)
Lymphs: 24 %
MCH: 23.5 pg — ABNORMAL LOW (ref 26.6–33.0)
MCHC: 30.1 g/dL — ABNORMAL LOW (ref 31.5–35.7)
MCV: 78 fL — ABNORMAL LOW (ref 79–97)
Monocytes Absolute: 0.3 10*3/uL (ref 0.1–0.9)
Monocytes: 7 %
Neutrophils Absolute: 3.1 10*3/uL (ref 1.4–7.0)
Neutrophils: 65 %
Platelets: 225 10*3/uL (ref 150–450)
RBC: 4.12 x10E6/uL (ref 3.77–5.28)
RDW: 14.5 % (ref 11.7–15.4)
WBC: 4.8 10*3/uL (ref 3.4–10.8)

## 2021-01-07 LAB — HEMOGLOBIN A1C
Est. average glucose Bld gHb Est-mCnc: 151 mg/dL
Hgb A1c MFr Bld: 6.9 % — ABNORMAL HIGH (ref 4.8–5.6)

## 2021-01-09 NOTE — Progress Notes (Signed)
Please let patient know that her lab work looks good. Her A1c is improved from 8.4 to 6.5 which is great news.  This means the Rybelsus is working well for her.  She does have some anemia which stable from prior and likely related to her kidney disease.  Kidney function remains stable.  Follow up as discussed.

## 2021-01-10 NOTE — Progress Notes (Signed)
Labs printed and mailed.

## 2021-01-24 ENCOUNTER — Inpatient Hospital Stay: Payer: Medicare Other | Attending: Oncology | Admitting: Oncology

## 2021-01-24 ENCOUNTER — Encounter: Payer: Self-pay | Admitting: Oncology

## 2021-01-24 ENCOUNTER — Other Ambulatory Visit: Payer: Medicare Other

## 2021-01-24 ENCOUNTER — Inpatient Hospital Stay: Payer: Medicare Other

## 2021-01-24 VITALS — BP 130/66 | Temp 98.2°F | Resp 18 | Wt 207.4 lb

## 2021-01-24 DIAGNOSIS — R635 Abnormal weight gain: Secondary | ICD-10-CM | POA: Insufficient documentation

## 2021-01-24 DIAGNOSIS — N1832 Chronic kidney disease, stage 3b: Secondary | ICD-10-CM | POA: Diagnosis not present

## 2021-01-24 DIAGNOSIS — I129 Hypertensive chronic kidney disease with stage 1 through stage 4 chronic kidney disease, or unspecified chronic kidney disease: Secondary | ICD-10-CM | POA: Insufficient documentation

## 2021-01-24 DIAGNOSIS — D631 Anemia in chronic kidney disease: Secondary | ICD-10-CM | POA: Diagnosis not present

## 2021-01-24 DIAGNOSIS — C679 Malignant neoplasm of bladder, unspecified: Secondary | ICD-10-CM

## 2021-01-24 DIAGNOSIS — E785 Hyperlipidemia, unspecified: Secondary | ICD-10-CM | POA: Diagnosis not present

## 2021-01-24 DIAGNOSIS — M810 Age-related osteoporosis without current pathological fracture: Secondary | ICD-10-CM | POA: Insufficient documentation

## 2021-01-24 DIAGNOSIS — C7910 Secondary malignant neoplasm of unspecified urinary organs: Secondary | ICD-10-CM

## 2021-01-24 DIAGNOSIS — Z86718 Personal history of other venous thrombosis and embolism: Secondary | ICD-10-CM | POA: Insufficient documentation

## 2021-01-24 DIAGNOSIS — E1122 Type 2 diabetes mellitus with diabetic chronic kidney disease: Secondary | ICD-10-CM | POA: Diagnosis not present

## 2021-01-24 DIAGNOSIS — Z791 Long term (current) use of non-steroidal anti-inflammatories (NSAID): Secondary | ICD-10-CM | POA: Diagnosis not present

## 2021-01-24 DIAGNOSIS — E119 Type 2 diabetes mellitus without complications: Secondary | ICD-10-CM | POA: Diagnosis not present

## 2021-01-24 DIAGNOSIS — R188 Other ascites: Secondary | ICD-10-CM | POA: Diagnosis not present

## 2021-01-24 DIAGNOSIS — Z5112 Encounter for antineoplastic immunotherapy: Secondary | ICD-10-CM | POA: Insufficient documentation

## 2021-01-24 DIAGNOSIS — N179 Acute kidney failure, unspecified: Secondary | ICD-10-CM

## 2021-01-24 DIAGNOSIS — K435 Parastomal hernia without obstruction or  gangrene: Secondary | ICD-10-CM | POA: Insufficient documentation

## 2021-01-24 DIAGNOSIS — Z87891 Personal history of nicotine dependence: Secondary | ICD-10-CM | POA: Insufficient documentation

## 2021-01-24 LAB — RETIC PANEL
Immature Retic Fract: 10.5 % (ref 2.3–15.9)
RBC.: 3.91 MIL/uL (ref 3.87–5.11)
Retic Count, Absolute: 52.4 10*3/uL (ref 19.0–186.0)
Retic Ct Pct: 1.3 % (ref 0.4–3.1)
Reticulocyte Hemoglobin: 27.5 pg — ABNORMAL LOW (ref >27.9)

## 2021-01-24 LAB — CBC WITH DIFFERENTIAL/PLATELET
Abs Immature Granulocytes: 0.05 10*3/uL (ref 0.00–0.07)
Basophils Absolute: 0 10*3/uL (ref 0.0–0.1)
Basophils Relative: 1 %
Eosinophils Absolute: 0.1 10*3/uL (ref 0.0–0.5)
Eosinophils Relative: 3 %
HCT: 30.8 % — ABNORMAL LOW (ref 36.0–46.0)
Hemoglobin: 9.3 g/dL — ABNORMAL LOW (ref 12.0–15.0)
Immature Granulocytes: 1 %
Lymphocytes Relative: 23 %
Lymphs Abs: 1.1 10*3/uL (ref 0.7–4.0)
MCH: 23.8 pg — ABNORMAL LOW (ref 26.0–34.0)
MCHC: 30.2 g/dL (ref 30.0–36.0)
MCV: 79 fL — ABNORMAL LOW (ref 80.0–100.0)
Monocytes Absolute: 0.4 10*3/uL (ref 0.1–1.0)
Monocytes Relative: 8 %
Neutro Abs: 3.3 10*3/uL (ref 1.7–7.7)
Neutrophils Relative %: 64 %
Platelets: 211 10*3/uL (ref 150–400)
RBC: 3.9 MIL/uL (ref 3.87–5.11)
RDW: 14.4 % (ref 11.5–15.5)
WBC: 5 10*3/uL (ref 4.0–10.5)
nRBC: 0 % (ref 0.0–0.2)

## 2021-01-24 LAB — COMPREHENSIVE METABOLIC PANEL
ALT: 10 U/L (ref 0–44)
AST: 20 U/L (ref 15–41)
Albumin: 4.1 g/dL (ref 3.5–5.0)
Alkaline Phosphatase: 101 U/L (ref 38–126)
Anion gap: 8 (ref 5–15)
BUN: 40 mg/dL — ABNORMAL HIGH (ref 8–23)
CO2: 23 mmol/L (ref 22–32)
Calcium: 9.1 mg/dL (ref 8.9–10.3)
Chloride: 107 mmol/L (ref 98–111)
Creatinine, Ser: 1.76 mg/dL — ABNORMAL HIGH (ref 0.44–1.00)
GFR, Estimated: 31 mL/min — ABNORMAL LOW (ref >60)
Glucose, Bld: 138 mg/dL — ABNORMAL HIGH (ref 70–99)
Potassium: 4.3 mmol/L (ref 3.5–5.1)
Sodium: 138 mmol/L (ref 135–145)
Total Bilirubin: 0.5 mg/dL (ref 0.3–1.2)
Total Protein: 7.8 g/dL (ref 6.5–8.1)

## 2021-01-24 LAB — IRON AND TIBC
Iron: 62 ug/dL (ref 28–170)
Saturation Ratios: 20 % (ref 10.4–31.8)
TIBC: 315 ug/dL (ref 250–450)
UIBC: 253 ug/dL

## 2021-01-24 LAB — FERRITIN: Ferritin: 239 ng/mL (ref 11–307)

## 2021-01-24 MED ORDER — IRON SUCROSE 20 MG/ML IV SOLN
200.0000 mg | Freq: Once | INTRAVENOUS | Status: AC
  Administered 2021-01-24: 200 mg via INTRAVENOUS
  Filled 2021-01-24: qty 10

## 2021-01-24 MED ORDER — SODIUM CHLORIDE 0.9 % IV SOLN
Freq: Once | INTRAVENOUS | Status: AC
  Filled 2021-01-24: qty 250

## 2021-01-24 MED ORDER — SODIUM CHLORIDE 0.9 % IV SOLN
200.0000 mg | Freq: Once | INTRAVENOUS | Status: AC
  Administered 2021-01-24: 200 mg via INTRAVENOUS
  Filled 2021-01-24: qty 8

## 2021-01-24 MED ORDER — HEPARIN SOD (PORK) LOCK FLUSH 100 UNIT/ML IV SOLN
INTRAVENOUS | Status: AC
  Filled 2021-01-24: qty 5

## 2021-01-24 MED ORDER — HEPARIN SOD (PORK) LOCK FLUSH 100 UNIT/ML IV SOLN
500.0000 [IU] | Freq: Once | INTRAVENOUS | Status: AC | PRN
  Administered 2021-01-24: 500 [IU]
  Filled 2021-01-24: qty 5

## 2021-01-24 NOTE — Progress Notes (Signed)
Hematology/Oncology follow up note [facility] Telephone:(336) [phone] [phone]   Patient Care Team: [name], NP as PCP - General Nice, [name], OD (Optometry) [name], MD as Consulting Physician (Hematology and Oncology) [name], [facility] (Pharmacist)  REFERRING PROVIDER: Dr.Sninsky CHIEF COMPLAINTS/REASON FOR VISIT:  Follow up for bladder cancer, anemia.   HISTORY OF PRESENTING ILLNESS:  Angela Russell is a  70 y.o.  female with PMH listed below who was referred to me for evaluation of newly diagnosed bladder cancer. Patient initially presented to emergency room at the end of January 2020 for evaluation of dysuria, hematuria and the left lower quadrant inguinal pain and flank pain.  1/28 2020 CT renal stone study showed suspected irregular wall thickening about the superior bladder, not well assessed due to degree of bladder distention.  Recommend cystoscopy for further evaluation.  No renal stone or obstructive uropathy.  Patient was given IV Rocephin and referred patient for outpatient urology follow-up.  Urine culture was negative.  She again presented to ER after 2 days with similar symptoms.  Patient has 25-pack-year smoking history, quit approximately 20 years ago.  No family history of any urology malignancies 07/03/2018 another CT abdomen pelvis with contrast was done which showed no nephrolithiasis or hydronephrosis is identified.  Bladder is decompressed limiting evaluation.  07/16/2018.urology Dr. [name] - cystoscopy and bilateral retrograde pyelogram on 07/16/2018.  Pyelogram did not show any filling defect or abnormalities.  No hydronephrosis.  Ureteral orifice was not involved with tumor.  There is a large 5 cm posterior wall bladder tumor, bullous and sessile appearing.  Patient underwent TURBT.   Pathology: High-grade urothelial carcinoma, invasive into muscularis propria.  Lymphovascular invasion is present.  Carcinoma in situ  is also identified.  Focal squamous differentiation is noted, areas of invasive carcinoma display pleomorphic/sarcomatoid changes.  T2b  08/07/2018 CT without contrast negative.  2 subpleural right upper lobe nodule 2 to 3 mm likely benign. She also had baseline audiometry done.  08/04/2018 ddMVAC x 1 cycle, stopped due to intolerance and AKI.  Patient received 1 cycle of dd MVAC, not able to tolerate due to AKI. Patient then was referred to [facility] urology  09/22/2018 patient underwent a cystectomy, pathology pT3a N0 Mx.  11 lymph nodes were harvested and was all negative. Invasive urothelia carcinoma, high grade, with sarcomatoid features.   10/14/2018 patient was admitted due to pyelonephritis and a pelvic fluid collection.  Drain was placed. 10/23/2018 drain was removed. Patient has had difficulties getting to her appointments to [facility]. 02/09/2019, patient presented with abdominal pain. CT concerning for small bowel obstruction and increased size of right pelvic fluid collection concerning for cancer recurrence.  Right hydronephrosis to the level of pelvis and enlarged lymph nodes. Patient had JP drain placed with CT guidance to pelvic fluid collection and drained 400 cc amber fluid.  Culture was negative for growth of microorganisms and cytology was negative for malignancy.-JP drain was removed on the day of discharge. CT-guided core biopsy of pelvic lymph node adenopathy was attempted but not successful.  # Right lower extremity DVT, provoked by Transvenous biopsy  02/26/2019 transvenous biopsy by IR unsuccessful transvenous biopsy of the right pelvis mass. Post biopsy acute thrombus in the right external iliac and common femoral vein.  Patient was recommended to start anticoagulation with Xarelto however due to the co-pay, patient is not able to afford the medication. Anticoagulation regimen was switched to Eliquis 5 mg.  Patient reports that she has  been taking it once a day. 03/20/2019  PET showed FDG avid tissue in the cystectomy bed, retroperitoneal and pelvic lymphadenopathy.  Right common iliac DVT.  # increased right lower extremity swelling.  She was started on Eliquis for anticoagulation by [facility].  We clarified with her pharmacy and she was actually taking Eliquis 2.5 mg twice daily. Right lower extremity swelling has not improved but instead worsened. 03/16/2019 She had ultrasound right lower extremity done which showed persistent extensive proximal right lower extremity DVT.  Anticoagulation regimen has increased to Eliquis 5 mg twice daily.  # establish care with [facility] oncology Dr. [name] for evaluation.  Dr. [name] recommended starting immunotherapy with PD-L1 inhibitor Pembrolizumab 200 mg every 3 weeks.  The sarcomatoid histology may not respond to chemotherapy well, could portend a better chance of response into immunotherapy PET scan after 4 cycles of Keytruda showed single mildly enlarged central mesenteric lymph node in the upper pelvis with SUV 9.6.  No other abnormal hypermetabolic activity was reported.  # #Right lower extremity  femoral vein DVT provoked by transvenous biopsy in the context of cancer recurrence, Status post IV C filter and mechanical thrombectomy.  IVC filter has been removed. She is currently off anticoagulation after she developed hematuria.  # 03/30/2019 Status post IVC filter placement, mechanical thrombectomy.-IVC filter was retrieved in January 2021. # PD-L1 CPS 100%.  # 03/31/2019 started on immunotherapy Keytruda.  # 04/26/2020 CT chest abdomen pelvis was reviewed and discussed with patient. No definitive finding of disease recurrence or metastasis.  Small amount of ascites similar to prior. There is a parastomal hernia containing a small margin of adjacent small bowel.  This is associated with wall thickening in approximately 7 cm segment of bowel adjacent to the hernia.  There is adjacent abnormal stranding of the mesentery and  omentum.  # 08/19/2020, CT chest abdomen pelvis showed no evidence of new/progressive metastatic disease. NED Hold off treatment due to uncontrolled hypotension.  # Chronic abdominal discomfort due to hernia. 08/11/2020, patient was seen by Dr. Dolphus Jenny for parastomal hernia.  Patient has no obstructive symptoms and would like to continue to monitor her symptoms and hold off any intervention at this point.  Patient was recommended to wear ostomy hernia belt and was referred to [facility] wound ostomy nursing team.   Odum is a 70 y.o. female who has above history reviewed by me today presents for follow up visit for evaluation form metastatic high-grade urothelial carcinoma of the bladder. Sarcomatoid feature.  Patient has been on maintenance immunotherapy.  With Keytruda She is currently on Keytruda every 4 weeks per patient's request. No new complaints.  Chronic fatigue unchanged.    Review of Systems  Constitutional:  Negative for appetite change, chills, fatigue and fever.  HENT:   Negative for hearing loss and voice change.   Eyes:  Negative for eye problems.  Respiratory:  Negative for chest tightness and cough.   Cardiovascular:  Negative for chest pain and leg swelling.  Gastrointestinal:  Positive for constipation. Negative for abdominal distention, abdominal pain and blood in stool.  Endocrine: Negative for hot flashes.  Genitourinary:  Negative for difficulty urinating and frequency.   Musculoskeletal:  Negative for arthralgias.  Skin:  Negative for itching and rash.  Neurological:  Positive for numbness. Negative for extremity weakness.  Hematological:  Negative for adenopathy.  Psychiatric/Behavioral:  Negative for confusion. The patient is not nervous/anxious.    MEDICAL HISTORY:  Past Medical History:  Diagnosis Date  Carotid artery plaque, right 01/2014   CKD (chronic kidney disease)    stage 2-3   Controlled diabetes mellitus type 2 with  complications (HCC)    Diabetes mellitus without complication (HCC)    DM (diabetes mellitus), type 2, uncontrolled (HCC)    Hyperlipidemia    Hypertension    Hypochromic microcytic anemia    mild   Metastatic urothelial carcinoma ([place]) 03/23/2019   Osteoporosis    Renal insufficiency    Rotator cuff tendonitis, right     SURGICAL HISTORY: Past Surgical History:  Procedure Laterality Date   ABDOMINAL HYSTERECTOMY  2000   due to bleeding and fibroids, partial- still has ovaries   CYSTOSCOPY W/ RETROGRADES Bilateral 07/16/2018   Procedure: CYSTOSCOPY WITH RETROGRADE PYELOGRAM;  Surgeon: Billey Co, MD;  Location: [facility] ORS;  Service: Urology;  Laterality: Bilateral;   IVC FILTER INSERTION N/A 03/30/2019   Procedure: IVC FILTER INSERTION;  Surgeon: Algernon Huxley, MD;  Location: [place] [facility];  Service: Cardiovascular;  Laterality: N/A;   IVC FILTER REMOVAL N/A 06/15/2019   Procedure: IVC FILTER REMOVAL;  Surgeon: Algernon Huxley, MD;  Location: [place] [facility];  Service: Cardiovascular;  Laterality: N/A;   KNEE SURGERY Left 03/17/2013   torn meniscus   PERIPHERAL VASCULAR THROMBECTOMY Right 03/30/2019   Procedure: PERIPHERAL VASCULAR THROMBECTOMY;  Surgeon: Algernon Huxley, MD;  Location: [place] [facility];  Service: Cardiovascular;  Laterality: Right;   PORTA CATH INSERTION N/A 08/06/2018   Procedure: PORTA CATH INSERTION;  Surgeon: Algernon Huxley, MD;  Location: [place] [facility];  Service: Cardiovascular;  Laterality: N/A;   TRANSURETHRAL RESECTION OF BLADDER TUMOR N/A 07/16/2018   Procedure: TRANSURETHRAL RESECTION OF BLADDER TUMOR (TURBT);  Surgeon: Billey Co, MD;  Location: [facility] ORS;  Service: Urology;  Laterality: N/A;    SOCIAL HISTORY: Social History   Socioeconomic History   Marital status: Single    Spouse name: Not on file   Number of children: 1   Years of education: Not on file   Highest education level: 10th grade  Occupational History   Not on  file  Tobacco Use   Smoking status: Former    Types: Cigarettes    Quit date: 06/05/1991    Years since quitting: 29.6   Smokeless tobacco: Never  Vaping Use   Vaping Use: Never used  Substance and Sexual Activity   Alcohol use: No   Drug use: No   Sexual activity: Not Currently  Other Topics Concern   Not on file  Social History Narrative   Working full time   Social Determinants of Radio broadcast assistant Strain: Low Risk    Difficulty of Paying Living Expenses: Not hard at all  Food Insecurity: Not on file  Transportation Needs: Not on file  Physical Activity: Not on file  Stress: Not on file  Social Connections: Not on file  Intimate Partner Violence: Not on file    FAMILY HISTORY: Family History  Problem Relation Age of Onset   Heart disease Mother    Heart attack Mother    Arthritis Father    Diabetes Brother     ALLERGIES:  has No Known Allergies.  MEDICATIONS:  Current Outpatient Medications  Medication Sig Dispense Refill   amLODipine (NORVASC) 5 MG tablet Take 1 tablet (5 mg total) by mouth daily. 90 tablet 1   diclofenac sodium (VOLTAREN) 1 % GEL Apply 2 g topically 4 (four) times daily. (Patient taking differently:  Apply 2 g topically as needed.) 100 g 2   docusate sodium (COLACE) 100 MG capsule Take 1 capsule (100 mg total) by mouth daily. 90 capsule 0   lidocaine-prilocaine (EMLA) cream Apply 1 application topically as needed. 30 g 3   losartan (COZAAR) 25 MG tablet Take 1 tablet (25 mg total) by mouth daily. 90 tablet 0   pembrolizumab (KEYTRUDA) 100 MG/4ML SOLN Inject 200 mg into the vein. Every 42 days,    Last txy 4/05, Next 5/03     polyethylene glycol (MIRALAX / GLYCOLAX) 17 g packet Take 17 g by mouth daily as needed for mild constipation. 14 each 0   rosuvastatin (CRESTOR) 40 MG tablet Take 1 tablet (40 mg total) by mouth daily. 90 tablet 1   RYBELSUS 3 MG TABS Take 1 tablet by mouth once daily 90 tablet 1   cefpodoxime (VANTIN) 200 MG  tablet SMARTSIG:1 Tablet(s) By Mouth Every 12 Hours (Patient not taking: No sig reported)     No current facility-administered medications for this visit.   Facility-Administered Medications Ordered in Other Visits  Medication Dose Route Frequency Provider Last Rate Last Admin   heparin lock flush 100 UNIT/ML injection              PHYSICAL EXAMINATION: ECOG PERFORMANCE STATUS: 1 - Symptomatic but completely ambulatory Vitals:   01/24/21 0906  BP: 130/66  Resp: 18  Temp: 98.2 F (36.8 C)   Filed Weights   01/24/21 0906  Weight: 207 lb 6.4 oz (94.1 kg)    Physical Exam Constitutional:      General: She is not in acute distress.    Comments: Walk independently  HENT:     Head: Normocephalic and atraumatic.  Eyes:     General: No scleral icterus.    Pupils: Pupils are equal, round, and reactive to light.  Cardiovascular:     Rate and Rhythm: Normal rate and regular rhythm.     Heart sounds: Normal heart sounds.  Pulmonary:     Effort: Pulmonary effort is normal. No respiratory distress.     Breath sounds: No wheezing.  Abdominal:     General: Bowel sounds are normal. There is no distension.     Palpations: Abdomen is soft. There is no mass.     Comments: Ureterostomy,   Musculoskeletal:        General: No deformity. Normal range of motion.     Cervical back: Normal range of motion and neck supple.  Skin:    General: Skin is warm and dry.     Coloration: Skin is not pale.     Findings: No erythema or rash.  Neurological:     Mental Status: She is alert and oriented to person, place, and time. Mental status is at baseline.     Cranial Nerves: No cranial nerve deficit.     Coordination: Coordination normal.  Psychiatric:        Mood and Affect: Mood normal.  .  RADIOGRAPHIC STUDIES: I have personally reviewed the radiological images as listed and agreed with the findings in the report.  CMP Latest Ref Rng & Units 01/24/2021  Glucose 70 - 99 mg/dL 138(H)  BUN 8  - 23 mg/dL 40(H)  Creatinine 0.44 - 1.00 mg/dL 1.76(H)  Sodium 135 - 145 mmol/L 138  Potassium 3.5 - 5.1 mmol/L 4.3  Chloride 98 - 111 mmol/L 107  CO2 22 - 32 mmol/L 23  Calcium 8.9 - 10.3 mg/dL 9.1  Total Protein 6.5 -  8.1 g/dL 7.8  Total Bilirubin 0.3 - 1.2 mg/dL 0.5  Alkaline Phos 38 - 126 U/L 101  AST 15 - 41 U/L 20  ALT 0 - 44 U/L 10   CBC Latest Ref Rng & Units 01/24/2021  WBC 4.0 - 10.5 K/uL 5.0  Hemoglobin 12.0 - 15.0 g/dL 9.3(L)  Hematocrit 36.0 - 46.0 % 30.8(L)  Platelets 150 - 400 K/uL 211    LABORATORY DATA:  I have reviewed the data as listed Lab Results  Component Value Date   WBC 5.0 01/24/2021   HGB 9.3 (L) 01/24/2021   HCT 30.8 (L) 01/24/2021   MCV 79.0 (L) 01/24/2021   PLT 211 01/24/2021   Recent Labs    02/09/20 0832 02/16/20 1307 03/01/20 1301 03/22/20 0846 12/13/20 0949 12/27/20 0838 01/06/21 0829 01/24/21 0838  NA 138 139 140   < > 139 140 142 138  K 4.4 4.6 4.6   < > 4.1 4.0 4.6 4.3  CL 104 107 108   < > 105 108 108* 107  CO2 21* 20* 22   < > _0 GLUCOSE 228* 163* 190*   < > 173* 139* 133* 138*  BUN 41* 49* 33*   < > 27* 27* 24 40*  CREATININE 2.35* 2.41* 2.08*   < > 1.75* 1.57* 1.88* 1.76*  CALCIUM 9.0 9.3 9.3   < > 9.7 8.9 9.4 9.1  GFRNONAA 20* 20* 24*   < > 31* 35*  --  31*  GFRAA 24* 23* 27*  --   --   --   --   --   PROT 7.9 8.6* 7.8   < > 8.0 7.5 7.3 7.8  ALBUMIN 4.3 4.5 4.3   < > 4.3 4.0 4.4 4.1  AST _1 < > _2 ALT _3 < > _4 ALKPHOS 100 86 87   < > 100 99 136* 101  BILITOT 0.6 0.5 0.5   < > 0.6 0.5 0.2 0.5   < > = values in this interval not displayed.    Iron/TIBC/Ferritin/ %Sat    Component Value Date/Time   IRON 62 01/24/2021 0838   IRON 26 (L) 02/04/2019 1309   TIBC 315 01/24/2021 0838   TIBC 251 02/04/2019 1309   FERRITIN 239 01/24/2021 0838   FERRITIN 409 (H) 02/04/2019 1309   IRONPCTSAT 20 01/24/2021 0838   IRONPCTSAT 10 (L) 02/04/2019 1309    RADIOGRAPHIC STUDIES: I  have personally reviewed the radiological images as listed and agreed with the findings in the report. CT ABDOMEN PELVIS W CONTRAST  Result Date: 12/13/2020 CLINICAL DATA:  Generalized abdominal pain for 3 weeks, history of bladder cancer EXAM: CT ABDOMEN AND PELVIS WITH CONTRAST TECHNIQUE: Multidetector CT imaging of the abdomen and pelvis was performed using the standard protocol following bolus administration of intravenous contrast. CONTRAST:  48m OMNIPAQUE IOHEXOL 300 MG/ML SOLN, additional oral enteric contrast COMPARISON:  08/19/2020 FINDINGS: Lower chest: No acute abnormality. Hepatobiliary: No solid liver abnormality is seen. No gallstones, gallbladder wall thickening, or biliary dilatation. Pancreas: Unremarkable. No pancreatic ductal dilatation or surrounding inflammatory changes. Spleen: Normal in size without significant abnormality. Adrenals/Urinary Tract: Adrenal glands are unremarkable. Status post cystectomy with right lower quadrant ileal conduit urinary diversion. Mild bilateral hydronephrosis and hydroureter to the ileal conduit, unchanged compared to prior. Stomach/Bowel: Stomach is within normal limits. Appendix appears normal. No evidence of bowel wall  thickening, distention, or inflammatory changes. Vascular/Lymphatic: Aortic atherosclerosis. Right common and external iliac vein stent. No enlarged abdominal or pelvic lymph nodes. Reproductive: Status post hysterectomy. Other: Right lower quadrant ileal conduit urinary diversion with a parastomal hernia containing nonobstructed loops of distal small bowel. Small volume ascites in the low pelvis, similar to prior examination. Musculoskeletal: No acute or significant osseous findings. IMPRESSION: 1. No acute CT findings of the abdomen or pelvis to explain pain. 2. Status post cystectomy with right lower quadrant ileal conduit urinary diversion. Mild bilateral hydronephrosis and hydroureter to the ileal conduit, unchanged compared to prior  examination. 3. Parastomal hernia containing nonobstructed loops of distal small bowel. 4. Small volume ascites in the low pelvis, similar to prior examination. Aortic Atherosclerosis (ICD10-I70.0). Electronically Signed   By: Eddie Candle M.D.   On: 12/13/2020 12:09      ASSESSMENT & PLAN:  1. Metastatic urothelial carcinoma ([place])   2. Encounter for antineoplastic immunotherapy   3. Anemia in stage 3b chronic kidney disease ([place])   4. Weight gain   5. Parastomal hernia without obstruction or gangrene   Cancer Staging Bladder carcinoma [facility] Staging form: Urinary Bladder, AJCC 8th Edition - Clinical stage from 08/01/2018: Stage IVA (cTX, cN3, cM1a) - Signed by [name], MD on 04/21/2019  #Metastatic urothelial carcinoma of bladder, sarcomatoid features pelvic sidewall mass biopsy showed metastatic carcinoma consistent with involvement by urothelial carcinoma., Stage IV NED-  Labs reviewed and discussed with patient Proceed with Keytruda 200 mg.  Every 4 weeks per patient's preference.  #Hernia -parastomal hernia.   She has establish care with [facility] hernia center.  She was recommended to monitor her symptoms and aware ostomy belt  #Chronic kidney disease, creatinine has improved to her baseline.  Encourage oral hydration.  #Chronic anemia secondary to stage III CKD.  Hemoglobin has further decreased to 9.3 today.   I will proceed with maintenance IV Venofer treatment today.   #History of diabetes. A1c few months ago was 8.4 Blood glucose is 138 today.  Stable.  #Weight gain.  TSH has been monitored and has been normal.  She has been referred to establish care with dietitian and no showed.   #Follow-up in 4 weeks for repeat blood work, MD evaluation and Keytruda and Venofer.  Patient prefers every 4 weeks treatment  .All questions were answered. The patient knows to call the clinic with any problems questions or concerns.  [name], MD, PhD Hematology Oncology [place] at [facility]  01/24/21

## 2021-01-24 NOTE — Progress Notes (Signed)
Pt here for follow up. Pt reports that she has soreness to lower abdomen occasionally.

## 2021-01-24 NOTE — Progress Notes (Signed)
Per Peyton Najjar., CMA, patient is to receive Venofer in addtion to Keytruda infusion today.

## 2021-01-24 NOTE — Patient Instructions (Signed)
C[facility]ER CENTER [place] [facility]  Discharge Instructions: Thank you for choosing [facility] to provide your oncology and hematology care.  If you have a lab appointment with the Cancer Center, please go directly to the Cancer Center and check in at the registration area.  Wear comfortable clothing and clothing appropriate for easy access to any Portacath or PICC line.   We strive to give you quality time with your provider. You may need to reschedule your appointment if you arrive late (15 or more minutes).  Arriving late affects you and other patients whose appointments are after yours.  Also, if you miss three or more appointments without notifying the office, you may be dismissed from the clinic at the provider's discretion.      For prescription refill requests, have your pharmacy contact our office and allow 72 hours for refills to be completed.    Today you received the following chemotherapy and/or immunotherapy agents : Keytruda    To help prevent nausea and vomiting after your treatment, we encourage you to take your nausea medication as directed.  BELOW ARE SYMPTOMS THAT SHOULD BE REPORTED IMMEDIATELY: *FEVER GREATER THAN 100.4 F (38 C) OR HIGHER *CHILLS OR SWEATING *NAUSEA AND VOMITING THAT IS NOT CONTROLLED WITH YOUR NAUSEA MEDICATION *UNUSUAL SHORTNESS OF BREATH *UNUSUAL BRUISING OR BLEEDING *URINARY PROBLEMS (pain or burning when urinating, or frequent urination) *BOWEL PROBLEMS (unusual diarrhea, constipation, pain near the anus) TENDERNESS IN MOUTH AND THROAT WITH OR WITHOUT PRESENCE OF ULCERS (sore throat, sores in mouth, or a toothache) UNUSUAL RASH, SWELLING OR PAIN  UNUSUAL VAGINAL DISCHARGE OR ITCHING   Items with * indicate a potential emergency and should be followed up as soon as possible or go to the Emergency Department if any problems should occur.  Please show the CHEMOTHERAPY ALERT CARD or IMMUNOTHERAPY ALERT CARD at check-in  to the Emergency Department and triage nurse.  Should you have questions after your visit or need to cancel or reschedule your appointment, please contact CANCER CENTER [place] [facility]  [phone] and follow the prompts.  Office hours are 8:00 a.m. to 4:30 p.m. Monday - Friday. Please note that voicemails left after 4:00 p.m. may not be returned until the following business day.  We are closed weekends and major holidays. You have access to a nurse at all times for urgent questions. Please call the main number to the clinic [phone] and follow the prompts.  For any non-urgent questions, you may also contact your provider using MyChart. We now offer e-Visits for anyone 18 and older to request care online for non-urgent symptoms. For details visit [email or web address].   Also download the MyChart app! Go to the app store, search "MyChart", open the app, select [place], and log in with your MyChart username and password.  Due to Covid, a mask is required upon entering the hospital/clinic. If you do not have a mask, one will be given to you upon arrival. For doctor visits, patients may have 1 support person aged 18 or older with them. For treatment visits, patients cannot have anyone with them due to current Covid guidelines and our immunocompromised population.  

## 2021-01-29 ENCOUNTER — Other Ambulatory Visit: Payer: Self-pay

## 2021-01-29 ENCOUNTER — Emergency Department
Admission: EM | Admit: 2021-01-29 | Discharge: 2021-01-29 | Disposition: A | Discharge status: Home / Self Care | Payer: Medicare Other | Attending: Emergency Medicine | Admitting: Emergency Medicine

## 2021-01-29 DIAGNOSIS — I129 Hypertensive chronic kidney disease with stage 1 through stage 4 chronic kidney disease, or unspecified chronic kidney disease: Secondary | ICD-10-CM | POA: Insufficient documentation

## 2021-01-29 DIAGNOSIS — N184 Chronic kidney disease, stage 4 (severe): Secondary | ICD-10-CM | POA: Insufficient documentation

## 2021-01-29 DIAGNOSIS — Z79899 Other long term (current) drug therapy: Secondary | ICD-10-CM | POA: Diagnosis not present

## 2021-01-29 DIAGNOSIS — N390 Urinary tract infection, site not specified: Secondary | ICD-10-CM | POA: Diagnosis not present

## 2021-01-29 DIAGNOSIS — R319 Hematuria, unspecified: Secondary | ICD-10-CM | POA: Diagnosis not present

## 2021-01-29 DIAGNOSIS — E1122 Type 2 diabetes mellitus with diabetic chronic kidney disease: Secondary | ICD-10-CM | POA: Insufficient documentation

## 2021-01-29 DIAGNOSIS — Z8551 Personal history of malignant neoplasm of bladder: Secondary | ICD-10-CM | POA: Insufficient documentation

## 2021-01-29 DIAGNOSIS — Z87891 Personal history of nicotine dependence: Secondary | ICD-10-CM | POA: Insufficient documentation

## 2021-01-29 LAB — URINALYSIS, COMPLETE (UACMP) WITH MICROSCOPIC
Bilirubin Urine: NEGATIVE
Glucose, UA: NEGATIVE mg/dL
Ketones, ur: NEGATIVE mg/dL
Leukocytes,Ua: NEGATIVE
Nitrite: NEGATIVE
Protein, ur: 30 mg/dL — AB
RBC / HPF: >50 RBC/hpf — ABNORMAL HIGH (ref 0–5)
Specific Gravity, Urine: 1.011 (ref 1.005–1.030)
Squamous Epithelial / HPF: NONE SEEN (ref 0–5)
pH: 6 (ref 5.0–8.0)

## 2021-01-29 MED ORDER — CEPHALEXIN 500 MG PO CAPS
500.0000 mg | ORAL_CAPSULE | Freq: Once | ORAL | Status: AC
  Administered 2021-01-29: 500 mg via ORAL
  Filled 2021-01-29: qty 1

## 2021-01-29 MED ORDER — CEPHALEXIN 500 MG PO CAPS: 500.0000 mg | ORAL_CAPSULE | Freq: Three times a day (TID) | ORAL | 0 refills | Status: AC

## 2021-01-29 NOTE — ED Triage Notes (Signed)
Pt comes pov with urostomy bag and states there is blood in it/it is darker than normal. Upon inspection, urine is amber colored but does not appear to have frank blood in it. Has been eating and drinking normally.

## 2021-01-29 NOTE — ED Notes (Signed)
Pt NAD, a/ox4. Pt verbalizes understanding of all DC and f/u instructions. All questions answered. Pt walks with steady gait to lobby at DC.  ? ?

## 2021-01-29 NOTE — Discharge Instructions (Addendum)
Take the antibiotic as directed. You have a urine culture pending, you will only be notified if your RX needs to be changed. Follow-up with your primary provider or specialists as needed.

## 2021-01-29 NOTE — ED Provider Notes (Signed)
[facility] Emergency Department Provider Note ____________________________________________  Time seen: 1218  I have reviewed the triage vital signs and the nursing notes.  HISTORY  Chief Complaint  Hematuria   HPI Angela Russell is a 70 y.o. female with the below history including CKD, diabetes, hypertension, and bladder carcinoma, presents for evaluation of dark-colored urine noted in her urostomy bag.  Patient denies any interim fevers, chills, or sweats.  She has noted dark urine in her urostomy bag since Friday.  She had a routine Keytruda injection last week, followed by her first iron infusion.  She noticed some joint and muscle discomfort the following day, and has since that time experienced some dark-colored urine.  She denies any nausea, vomiting, or diarrhea.  Past Medical History:  Diagnosis Date   Carotid artery plaque, right 01/2014   CKD (chronic kidney disease)    stage 2-3   Controlled diabetes mellitus type 2 with complications (HCC)    Diabetes mellitus without complication (HCC)    DM (diabetes mellitus), type 2, uncontrolled (HCC)    Hyperlipidemia    Hypertension    Hypochromic microcytic anemia    mild   Metastatic urothelial carcinoma ([place]) 03/23/2019   Osteoporosis    Renal insufficiency    Rotator cuff tendonitis, right     Patient Active Problem List   Diagnosis Date Noted   Uncontrolled type 2 diabetes mellitus ([place]) 10/03/2020   Malignant hypertension 08/30/2020   Parastomal hernia without obstruction or gangrene 07/12/2020   Rib pain on left side 08/05/2019   Chronic anticoagulation 08/05/2019   Anemia in stage 4 chronic kidney disease ([place]) 08/05/2019   Encounter for antineoplastic immunotherapy 08/05/2019   History of DVT of lower extremity 06/26/2019   Acute deep vein thrombosis (DVT) of femoral vein of right lower extremity ([place]) 03/31/2019   DVT (deep venous thrombosis) ([place]) 03/27/2019   Metastatic urothelial  carcinoma ([place]) 03/23/2019   Vaginal pain 01/27/2019   Neuropathic pain 01/27/2019   Hyperkalemia 12/19/2018   Fluid collection at surgical site 10/17/2018   Tachycardia 10/15/2018   Pyelonephritis 10/14/2018   Iron deficiency anemia due to chronic blood loss 08/26/2018   AKI (acute kidney injury) ([place]) 08/06/2018   Bladder carcinoma ([place]) 08/03/2018   Goals of care, counseling/discussion 08/03/2018   Bladder tumor 07/16/2018   Hyperlipidemia    Hypertension associated with diabetes ([place])    Renal insufficiency    CKD (chronic kidney disease)    Hypochromic microcytic anemia    Chronic anemia 07/24/2016   Carotid artery plaque, right 01/02/2014   Cervical facet joint syndrome 03/20/2013   Spondylolisthesis of lumbar region 03/20/2013    Past Surgical History:  Procedure Laterality Date   ABDOMINAL HYSTERECTOMY  2000   due to bleeding and fibroids, partial- still has ovaries   CYSTOSCOPY W/ RETROGRADES Bilateral 07/16/2018   Procedure: CYSTOSCOPY WITH RETROGRADE PYELOGRAM;  Surgeon: Billey Co, MD;  Location: [facility] ORS;  Service: Urology;  Laterality: Bilateral;   IVC FILTER INSERTION N/A 03/30/2019   Procedure: IVC FILTER INSERTION;  Surgeon: Algernon Huxley, MD;  Location: [place] [facility];  Service: Cardiovascular;  Laterality: N/A;   IVC FILTER REMOVAL N/A 06/15/2019   Procedure: IVC FILTER REMOVAL;  Surgeon: Algernon Huxley, MD;  Location: [place] [facility];  Service: Cardiovascular;  Laterality: N/A;   KNEE SURGERY Left 03/17/2013   torn meniscus   PERIPHERAL VASCULAR THROMBECTOMY Right 03/30/2019   Procedure: PERIPHERAL VASCULAR THROMBECTOMY;  Surgeon: Algernon Huxley,  MD;  Location: [place] [facility];  Service: Cardiovascular;  Laterality: Right;   PORTA CATH INSERTION N/A 08/06/2018   Procedure: PORTA CATH INSERTION;  Surgeon: Algernon Huxley, MD;  Location: [place] [facility];  Service: Cardiovascular;  Laterality: N/A;   TRANSURETHRAL RESECTION OF BLADDER TUMOR N/A  07/16/2018   Procedure: TRANSURETHRAL RESECTION OF BLADDER TUMOR (TURBT);  Surgeon: Billey Co, MD;  Location: [facility] OR[facility];  Service: Urology;  Laterality: N/A;    Prior to Admission medications   Medication Sig Start Date End Date Taking? Authorizing Provider  cephALEXin (KEFLEX) 500 MG capsule Take 1 capsule (500 mg total) by mouth 3 (three) times daily for 7 days. 01/29/21 02/05/21 Yes Cassy Sprowl, Dannielle Karvonen, PA-C  amLODipine (NORVASC) 5 MG tablet Take 1 tablet (5 mg total) by mouth daily. 11/04/20   [name], NP  diclofenac sodium (VOLTAREN) 1 % GEL Apply 2 g topically 4 (four) times daily. Patient taking differently: Apply 2 g topically as needed. 04/03/19   Volney American, PA-C  docusate sodium (COLACE) 100 MG capsule Take 1 capsule (100 mg total) by mouth daily. 03/22/20   [name], MD  lidocaine-prilocaine (EMLA) cream Apply 1 application topically as needed. 07/12/20   [name], MD  losartan (COZAAR) 25 MG tablet Take 1 tablet (25 mg total) by mouth daily. 08/22/20   [name], NP  pembrolizumab [facility]) 100 MG/4ML SOLN Inject 200 mg into the vein. Every 42 days,    Last txy 4/05, Next 5/03    [provider]  polyethylene glycol (MIRALAX / GLYCOLAX) 17 g packet Take 17 g by mouth daily as needed for mild constipation. 12/21/18   Vaughan Basta, MD  rosuvastatin (CRESTOR) 40 MG tablet Take 1 tablet (40 mg total) by mouth daily. 03/11/20   Eulogio Bear, NP  RYBELSUS 3 MG TABS Take 1 tablet by mouth once daily 11/04/20   [name], NP  prochlorperazine (COMPAZINE) 10 MG tablet Take 1 tablet (10 mg total) by mouth every 6 (six) hours as needed (Nausea or vomiting). 08/03/18 08/26/18  [name], MD    Allergies Patient has no known allergies.  Family History  Problem Relation Age of Onset   Heart disease Mother    Heart attack Mother    Arthritis Father    Diabetes Brother     Social History Social History   Tobacco Use   Smoking  status: Former    Types: Cigarettes    Quit date: 06/05/1991    Years since quitting: 29.6   Smokeless tobacco: Never  Vaping Use   Vaping Use: Never used  Substance Use Topics   Alcohol use: No   Drug use: No    Review of Systems  Constitutional: Negative for fever. Eyes: Negative for visual changes. ENT: Negative for sore throat. Cardiovascular: Negative for chest pain. Respiratory: Negative for shortness of breath. Gastrointestinal: Negative for abdominal pain, vomiting and diarrhea. Genitourinary: Negative for dysuria.  Reports dark-colored urine. Musculoskeletal: Negative for back pain. Skin: Negative for rash. Neurological: Negative for headaches, focal weakness or numbness. ____________________________________________  PHYSICAL EXAM:  VITAL SIGNS: ED Triage Vitals  Enc Vitals Group     BP 01/29/21 1011 (!) 155/75     Pulse Rate 01/29/21 1011 (!) 103     Resp 01/29/21 1011 18     Temp 01/29/21 1011 98.5 F (36.9 C)     Temp Source 01/29/21 1011 Oral     SpO2 01/29/21 1011 100 %  Weight 01/29/21 1012 207 lb (93.9 kg)     Height 01/29/21 1012 5\' 3"  (1.6 m)     Head Circumference --      Peak Flow --      Pain Score 01/29/21 1012 0     Pain Loc --      Pain Edu? --      Excl. in [place]? --     Constitutional: Alert and oriented. Well appearing and in no distress. Head: Normocephalic and atraumatic. Eyes: Conjunctivae are normal. Normal extraocular movements Cardiovascular: Normal rate, regular rhythm. Normal distal pulses. Respiratory: Normal respiratory effort. No wheezes/rales/rhonchi. Gastrointestinal: Soft and nontender. No distention. Musculoskeletal: Nontender with normal range of motion in all extremities.  Neurologic:  Normal gait without ataxia. Normal speech and language. No gross focal neurologic deficits are appreciated. Skin:  Skin is warm, dry and intact. No rash noted. Psychiatric: Mood and affect are normal. Patient exhibits appropriate insight  and judgment. ____________________________________________    {LABS (pertinent positives/negatives)  Labs Reviewed  URINALYSIS, COMPLETE (UACMP) WITH MICROSCOPIC - Abnormal; Notable for the following components:      Result Value   Color, Urine YELLOW (*)    APPearance HAZY (*)    Hgb urine dipstick LARGE (*)    Protein, ur 30 (*)    RBC / HPF >50 (*)    Bacteria, UA RARE (*)    All other components within normal limits  URINE CULTURE  ____________________________________________  {EKG  ____________________________________________   RADIOLOGY Official radiology report(s): No results found. ____________________________________________  PROCEDURES  Keflex 500 mg p.o.  Procedures ____________________________________________   INITIAL IMPRESSION / ASSESSMENT AND PLAN / ED COURSE  As part of my medical decision making, I reviewed the following data within the [place] reviewed as noted and Notes from prior ED visits     DDX: UTI, pyelonephritis  Patient with a urostomy secondary to bladder cancer, presents to the ED for concern over dark urine for the last several days patient evaluated for complaints in the ED, found to have urine that did have some microscopic hematuria.  Patient was treated empirically with Keflex and a urine culture is pending patient follow-up with her urologist for ongoing symptoms peer return precautions of been reviewed.    ERYANNA REGAL was evaluated in Emergency Department on 01/29/2021 for the symptoms described in the history of present illness. She was evaluated in the context of the global COVID-19 pandemic, which necessitated consideration that the patient might be at risk for infection with the SARS-CoV-2 virus that causes COVID-19. Institutional protocols and algorithms that pertain to the evaluation of patients at risk for COVID-19 are in a state of rapid change based on information released by regulatory bodies  including the CDC and federal and state organizations. These policies and algorithms were followed during the patient's care in the ED. ____________________________________________  FINAL CLINICAL IMPRESSION(S) / ED DIAGNOSES  Final diagnoses:  Urinary tract infection with hematuria, site unspecified      Melvenia Needles, PA-C 01/29/21 1638    Nance Pear, MD 01/30/21 1501

## 2021-01-31 LAB — URINE CULTURE: Culture: 50000 — AB

## 2021-02-01 NOTE — Consult Note (Signed)
Urine culture growing proteus vulgaris and providencia stuartii, both pathogens resistant to keflex. Discussed case with Dr. Jacqualine Code, plan to send prescription for augmentin 500 mg q12H x 7 days to [facility]. Pt was notified and informed of culture results. Recommended to contact her urologist to inform them about the culture results.   Thanks,   Eleonore Chiquito, PharmD, BCPS

## 2021-02-08 ENCOUNTER — Other Ambulatory Visit: Payer: Self-pay

## 2021-02-08 ENCOUNTER — Encounter: Payer: Self-pay | Admitting: Gastroenterology

## 2021-02-08 ENCOUNTER — Ambulatory Visit (INDEPENDENT_AMBULATORY_CARE_PROVIDER_SITE_OTHER): Payer: Medicare Other | Admitting: Gastroenterology

## 2021-02-08 VITALS — BP 152/75 | HR 92 | Temp 98.3°F | Ht 63.0 in | Wt 205.0 lb

## 2021-02-08 DIAGNOSIS — D509 Iron deficiency anemia, unspecified: Secondary | ICD-10-CM | POA: Diagnosis not present

## 2021-02-08 DIAGNOSIS — Z8619 Personal history of other infectious and parasitic diseases: Secondary | ICD-10-CM

## 2021-02-08 MED ORDER — PEG 3350-KCL-NA BICARB-NACL 420 G PO SOLR: ORAL | 0 refills | Status: DC

## 2021-02-08 NOTE — Progress Notes (Signed)
Angela Bellows MD, MRCP(U.K) [address]  [name]  [place], [place] [address]  Main: [phone]  Fax: [phone]   Primary Care Physician: [name], NP  Primary Gastroenterologist:  Dr. Jonathon Russell   Chief Complaint  Patient presents with   IDA    HPI: Angela Russell is a 70 y.o. female   Summary of history :  Initially seen back in 2020 for constipation.  History of cystectomy for bladder mass.  At that time she had a microcytic anemia and had issues with constipation.  Plan was to perform endoscopy evaluation obtain IV iron check B12 folate and commence on Linzess. She was positive H. pylori and was advised to get treatment for it and antibiotics were prescribed.  She did not obtain her endoscopy evaluation. Presently being referred for colon cancer screening  Interval history   2020-02/08/2021   01/24/2021: Hemoglobin 9.3 g, MCV 79 ferritin normal iron studies normal creatinine 1.88.  Hemoglobin stable around 9 gFor over 2 years She is receiving IV iron with Dr. Tasia Catchings.   No other complaints presently denies any GI symptoms.  Current Outpatient Medications  Medication Sig Dispense Refill   amLODipine (NORVASC) 5 MG tablet Take 1 tablet (5 mg total) by mouth daily. 90 tablet 1   amoxicillin-clavulanate (AUGMENTIN) 500-125 MG tablet Take 1 tablet by mouth 2 (two) times daily.     diclofenac sodium (VOLTAREN) 1 % GEL Apply 2 g topically 4 (four) times daily. (Patient taking differently: Apply 2 g topically as needed.) 100 g 2   docusate sodium (COLACE) 100 MG capsule Take 1 capsule (100 mg total) by mouth daily. 90 capsule 0   lidocaine-prilocaine (EMLA) cream Apply 1 application topically as needed. 30 g 3   losartan (COZAAR) 25 MG tablet Take 1 tablet (25 mg total) by mouth daily. 90 tablet 0   pembrolizumab (KEYTRUDA) 100 MG/4ML SOLN Inject 200 mg into the vein. Every 42 days,    Last txy 4/05, Next 5/03     polyethylene glycol (MIRALAX / GLYCOLAX) 17 g  packet Take 17 g by mouth daily as needed for mild constipation. 14 each 0   rosuvastatin (CRESTOR) 40 MG tablet Take 1 tablet (40 mg total) by mouth daily. 90 tablet 1   RYBELSUS 3 MG TABS Take 1 tablet by mouth once daily 90 tablet 1   No current facility-administered medications for this visit.   Facility-Administered Medications Ordered in Other Visits  Medication Dose Route Frequency Provider Last Rate Last Admin   heparin lock flush 100 UNIT/ML injection             Allergies as of 02/08/2021   (No Known Allergies)    ROS:  General: Negative for anorexia, weight loss, fever, chills, fatigue, weakness. ENT: Negative for hoarseness, difficulty swallowing , nasal congestion. CV: Negative for chest pain, angina, palpitations, dyspnea on exertion, peripheral edema.  Respiratory: Negative for dyspnea at rest, dyspnea on exertion, cough, sputum, wheezing.  GI: See history of present illness. GU:  Negative for dysuria, hematuria, urinary incontinence, urinary frequency, nocturnal urination.  Endo: Negative for unusual weight change.    Physical Examination:   BP (!) 152/75   Pulse 92   Temp 98.3 F (36.8 C) (Oral)   Ht 5\' 3"  (1.6 m)   Wt 205 lb (93 kg)   BMI 36.31 kg/m   General: Well-nourished, well-developed in no acute distress.  Eyes: No icterus. Conjunctivae pink. Mouth: Oropharyngeal mucosa moist and pink ,  no lesions erythema or exudate. Neuro: Alert and oriented x 3.  Grossly intact. Skin: Warm and dry, no jaundice.   Psych: Alert and cooperative, normal mood and affect.   Imaging Studies: No results found.  Assessment and Plan:   Angela Russell is a 70 y.o. y/o female last seen 2 years back for iron deficiency anemia did not follow-up subsequently.  Referred back for endoscopic evaluation.  She was also positive H. pylori that time treated with antibiotics.  We need to check for eradication.  Plan 1.  EGD and colonoscopy to evaluate for iron deficiency  anemia 2.  History of H. pylori infection status posttreatment with antibiotics we will check for eradication with a breath test. 3.  We will check celiac serology  I have discussed alternative options, risks & benefits,  which include, but are not limited to, bleeding, infection, perforation,respiratory complication & drug reaction.  The patient agrees with this plan & written consent will be obtained.     Dr Angela Bellows  MD,MRCP [facility]) Follow up in 12 weeks

## 2021-02-09 LAB — H. PYLORI BREATH TEST: H pylori Breath Test: NEGATIVE

## 2021-02-14 LAB — CELIAC DISEASE AB SCREEN W/RFX
Antigliadin Abs, IgA: 4 units (ref 0–19)
IgA/Immunoglobulin A, Serum: 266 mg/dL (ref 64–422)
Transglutaminase IgA: <2 U/mL (ref 0–3)

## 2021-02-15 ENCOUNTER — Other Ambulatory Visit: Payer: Self-pay | Admitting: Nurse Practitioner

## 2021-02-16 NOTE — Telephone Encounter (Signed)
This request was submitted and approved on 02/15/21. Confirmed received by pharmacy-this is a duplicate-refusing as not needed.

## 2021-02-21 ENCOUNTER — Encounter: Payer: Self-pay | Admitting: Oncology

## 2021-02-21 ENCOUNTER — Inpatient Hospital Stay: Payer: Medicare Other

## 2021-02-21 ENCOUNTER — Inpatient Hospital Stay: Payer: Medicare Other | Attending: Oncology

## 2021-02-21 ENCOUNTER — Inpatient Hospital Stay (HOSPITAL_BASED_OUTPATIENT_CLINIC_OR_DEPARTMENT_OTHER): Payer: Medicare Other | Admitting: Oncology

## 2021-02-21 VITALS — BP 130/80 | HR 87 | Temp 97.2°F | Resp 18 | Wt 205.9 lb

## 2021-02-21 DIAGNOSIS — M2550 Pain in unspecified joint: Secondary | ICD-10-CM | POA: Insufficient documentation

## 2021-02-21 DIAGNOSIS — Z86718 Personal history of other venous thrombosis and embolism: Secondary | ICD-10-CM | POA: Diagnosis not present

## 2021-02-21 DIAGNOSIS — I129 Hypertensive chronic kidney disease with stage 1 through stage 4 chronic kidney disease, or unspecified chronic kidney disease: Secondary | ICD-10-CM | POA: Diagnosis not present

## 2021-02-21 DIAGNOSIS — I700 Atherosclerosis of aorta: Secondary | ICD-10-CM | POA: Insufficient documentation

## 2021-02-21 DIAGNOSIS — C679 Malignant neoplasm of bladder, unspecified: Secondary | ICD-10-CM | POA: Insufficient documentation

## 2021-02-21 DIAGNOSIS — C7910 Secondary malignant neoplasm of unspecified urinary organs: Secondary | ICD-10-CM

## 2021-02-21 DIAGNOSIS — R188 Other ascites: Secondary | ICD-10-CM | POA: Diagnosis not present

## 2021-02-21 DIAGNOSIS — R918 Other nonspecific abnormal finding of lung field: Secondary | ICD-10-CM | POA: Insufficient documentation

## 2021-02-21 DIAGNOSIS — Z5112 Encounter for antineoplastic immunotherapy: Secondary | ICD-10-CM | POA: Diagnosis not present

## 2021-02-21 DIAGNOSIS — Z791 Long term (current) use of non-steroidal anti-inflammatories (NSAID): Secondary | ICD-10-CM | POA: Insufficient documentation

## 2021-02-21 DIAGNOSIS — M810 Age-related osteoporosis without current pathological fracture: Secondary | ICD-10-CM | POA: Diagnosis not present

## 2021-02-21 DIAGNOSIS — N132 Hydronephrosis with renal and ureteral calculous obstruction: Secondary | ICD-10-CM | POA: Insufficient documentation

## 2021-02-21 DIAGNOSIS — M7989 Other specified soft tissue disorders: Secondary | ICD-10-CM | POA: Diagnosis not present

## 2021-02-21 DIAGNOSIS — Z87891 Personal history of nicotine dependence: Secondary | ICD-10-CM | POA: Diagnosis not present

## 2021-02-21 DIAGNOSIS — Z79899 Other long term (current) drug therapy: Secondary | ICD-10-CM | POA: Insufficient documentation

## 2021-02-21 DIAGNOSIS — N1832 Chronic kidney disease, stage 3b: Secondary | ICD-10-CM

## 2021-02-21 DIAGNOSIS — E785 Hyperlipidemia, unspecified: Secondary | ICD-10-CM | POA: Diagnosis not present

## 2021-02-21 DIAGNOSIS — D631 Anemia in chronic kidney disease: Secondary | ICD-10-CM | POA: Insufficient documentation

## 2021-02-21 DIAGNOSIS — K435 Parastomal hernia without obstruction or  gangrene: Secondary | ICD-10-CM | POA: Diagnosis not present

## 2021-02-21 DIAGNOSIS — E1122 Type 2 diabetes mellitus with diabetic chronic kidney disease: Secondary | ICD-10-CM | POA: Diagnosis not present

## 2021-02-21 LAB — COMPREHENSIVE METABOLIC PANEL
ALT: 11 U/L (ref 0–44)
AST: 22 U/L (ref 15–41)
Albumin: 4 g/dL (ref 3.5–5.0)
Alkaline Phosphatase: 104 U/L (ref 38–126)
Anion gap: 8 (ref 5–15)
BUN: 28 mg/dL — ABNORMAL HIGH (ref 8–23)
CO2: 23 mmol/L (ref 22–32)
Calcium: 8.9 mg/dL (ref 8.9–10.3)
Chloride: 109 mmol/L (ref 98–111)
Creatinine, Ser: 1.56 mg/dL — ABNORMAL HIGH (ref 0.44–1.00)
GFR, Estimated: 35 mL/min — ABNORMAL LOW (ref >60)
Glucose, Bld: 111 mg/dL — ABNORMAL HIGH (ref 70–99)
Potassium: 4.3 mmol/L (ref 3.5–5.1)
Sodium: 140 mmol/L (ref 135–145)
Total Bilirubin: 0.6 mg/dL (ref 0.3–1.2)
Total Protein: 7.8 g/dL (ref 6.5–8.1)

## 2021-02-21 LAB — CBC WITH DIFFERENTIAL/PLATELET
Abs Immature Granulocytes: 0.02 10*3/uL (ref 0.00–0.07)
Basophils Absolute: 0 10*3/uL (ref 0.0–0.1)
Basophils Relative: 0 %
Eosinophils Absolute: 0.1 10*3/uL (ref 0.0–0.5)
Eosinophils Relative: 3 %
HCT: 30.5 % — ABNORMAL LOW (ref 36.0–46.0)
Hemoglobin: 9.4 g/dL — ABNORMAL LOW (ref 12.0–15.0)
Immature Granulocytes: 0 %
Lymphocytes Relative: 27 %
Lymphs Abs: 1.3 10*3/uL (ref 0.7–4.0)
MCH: 24.2 pg — ABNORMAL LOW (ref 26.0–34.0)
MCHC: 30.8 g/dL (ref 30.0–36.0)
MCV: 78.6 fL — ABNORMAL LOW (ref 80.0–100.0)
Monocytes Absolute: 0.4 10*3/uL (ref 0.1–1.0)
Monocytes Relative: 8 %
Neutro Abs: 2.9 10*3/uL (ref 1.7–7.7)
Neutrophils Relative %: 62 %
Platelets: 205 10*3/uL (ref 150–400)
RBC: 3.88 MIL/uL (ref 3.87–5.11)
RDW: 14.6 % (ref 11.5–15.5)
WBC: 4.7 10*3/uL (ref 4.0–10.5)
nRBC: 0 % (ref 0.0–0.2)

## 2021-02-21 MED ORDER — HEPARIN SOD (PORK) LOCK FLUSH 100 UNIT/ML IV SOLN
INTRAVENOUS | Status: AC
  Administered 2021-02-21: 500 [IU]
  Filled 2021-02-21: qty 5

## 2021-02-21 MED ORDER — HEPARIN SOD (PORK) LOCK FLUSH 100 UNIT/ML IV SOLN
500.0000 [IU] | Freq: Once | INTRAVENOUS | Status: AC | PRN
  Filled 2021-02-21: qty 5

## 2021-02-21 MED ORDER — SODIUM CHLORIDE 0.9 % IV SOLN
200.0000 mg | Freq: Once | INTRAVENOUS | Status: AC
  Administered 2021-02-21: 200 mg via INTRAVENOUS
  Filled 2021-02-21: qty 8

## 2021-02-21 MED ORDER — SODIUM CHLORIDE 0.9 % IV SOLN
Freq: Once | INTRAVENOUS | Status: AC
  Filled 2021-02-21: qty 250

## 2021-02-21 NOTE — Progress Notes (Signed)
Pt here for follow up. She reports that she does not want venofer anymore as it makes her have joint pain.

## 2021-02-21 NOTE — Progress Notes (Signed)
Hematology/Oncology follow up note [facility] Telephone:(336) [phone] [phone]   Patient Care Team: [name], NP as PCP - General Nice, [name], OD (Optometry) [name], MD as Consulting Physician (Hematology and Oncology) [name], [facility] (Pharmacist)  REFERRING PROVIDER: Dr.Sninsky CHIEF COMPLAINTS/REASON FOR VISIT:  Follow up for bladder cancer, anemia.   HISTORY OF PRESENTING ILLNESS:  Angela Russell is a  70 y.o.  female with PMH listed below who was referred to me for evaluation of newly diagnosed bladder cancer. Patient initially presented to emergency room at the end of January 2020 for evaluation of dysuria, hematuria and the left lower quadrant inguinal pain and flank pain.  1/28 2020 CT renal stone study showed suspected irregular wall thickening about the superior bladder, not well assessed due to degree of bladder distention.  Recommend cystoscopy for further evaluation.  No renal stone or obstructive uropathy.  Patient was given IV Rocephin and referred patient for outpatient urology follow-up.  Urine culture was negative.  She again presented to ER after 2 days with similar symptoms.  Patient has 25-pack-year smoking history, quit approximately 20 years ago.  No family history of any urology malignancies 07/03/2018 another CT abdomen pelvis with contrast was done which showed no nephrolithiasis or hydronephrosis is identified.  Bladder is decompressed limiting evaluation.  07/16/2018.urology Dr. [name] - cystoscopy and bilateral retrograde pyelogram on 07/16/2018.  Pyelogram did not show any filling defect or abnormalities.  No hydronephrosis.  Ureteral orifice was not involved with tumor.  There is a large 5 cm posterior wall bladder tumor, bullous and sessile appearing.  Patient underwent TURBT.   Pathology: High-grade urothelial carcinoma, invasive into muscularis propria.  Lymphovascular invasion is present.  Carcinoma in situ  is also identified.  Focal squamous differentiation is noted, areas of invasive carcinoma display pleomorphic/sarcomatoid changes.  T2b  08/07/2018 CT without contrast negative.  2 subpleural right upper lobe nodule 2 to 3 mm likely benign. She also had baseline audiometry done.  08/04/2018 ddMVAC x 1 cycle, stopped due to intolerance and AKI.  Patient received 1 cycle of dd MVAC, not able to tolerate due to AKI. Patient then was referred to [facility] urology  09/22/2018 patient underwent a cystectomy, pathology pT3a N0 Mx.  11 lymph nodes were harvested and was all negative. Invasive urothelia carcinoma, high grade, with sarcomatoid features.   10/14/2018 patient was admitted due to pyelonephritis and a pelvic fluid collection.  Drain was placed. 10/23/2018 drain was removed. Patient has had difficulties getting to her appointments to [facility]. 02/09/2019, patient presented with abdominal pain. CT concerning for small bowel obstruction and increased size of right pelvic fluid collection concerning for cancer recurrence.  Right hydronephrosis to the level of pelvis and enlarged lymph nodes. Patient had JP drain placed with CT guidance to pelvic fluid collection and drained 400 cc amber fluid.  Culture was negative for growth of microorganisms and cytology was negative for malignancy.-JP drain was removed on the day of discharge. CT-guided core biopsy of pelvic lymph node adenopathy was attempted but not successful.  # Right lower extremity DVT, provoked by Transvenous biopsy  02/26/2019 transvenous biopsy by IR unsuccessful transvenous biopsy of the right pelvis mass. Post biopsy acute thrombus in the right external iliac and common femoral vein.  Patient was recommended to start anticoagulation with Xarelto however due to the co-pay, patient is not able to afford the medication. Anticoagulation regimen was switched to Eliquis 5 mg.  Patient reports that she has  been taking it once a day. 03/20/2019  PET showed FDG avid tissue in the cystectomy bed, retroperitoneal and pelvic lymphadenopathy.  Right common iliac DVT.  # increased right lower extremity swelling.  She was started on Eliquis for anticoagulation by [facility].  We clarified with her pharmacy and she was actually taking Eliquis 2.5 mg twice daily. Right lower extremity swelling has not improved but instead worsened. 03/16/2019 She had ultrasound right lower extremity done which showed persistent extensive proximal right lower extremity DVT.  Anticoagulation regimen has increased to Eliquis 5 mg twice daily.  # establish care with [facility] oncology Dr. [name] for evaluation.  Dr. [name] recommended starting immunotherapy with PD-L1 inhibitor Pembrolizumab 200 mg every 3 weeks.  The sarcomatoid histology may not respond to chemotherapy well, could portend a better chance of response into immunotherapy PET scan after 4 cycles of Keytruda showed single mildly enlarged central mesenteric lymph node in the upper pelvis with SUV 9.6.  No other abnormal hypermetabolic activity was reported.  # #Right lower extremity  femoral vein DVT provoked by transvenous biopsy in the context of cancer recurrence, Status post IV C filter and mechanical thrombectomy.  IVC filter has been removed. She is currently off anticoagulation after she developed hematuria.  # 03/30/2019 Status post IVC filter placement, mechanical thrombectomy.-IVC filter was retrieved in January 2021. # PD-L1 CPS 100%.  # 03/31/2019 started on immunotherapy Keytruda.  # 04/26/2020 CT chest abdomen pelvis was reviewed and discussed with patient. No definitive finding of disease recurrence or metastasis.  Small amount of ascites similar to prior. There is a parastomal hernia containing a small margin of adjacent small bowel.  This is associated with wall thickening in approximately 7 cm segment of bowel adjacent to the hernia.  There is adjacent abnormal stranding of the mesentery and  omentum.  # 08/19/2020, CT chest abdomen pelvis showed no evidence of new/progressive metastatic disease. NED Hold off treatment due to uncontrolled hypotension.  # Chronic abdominal discomfort due to hernia. 08/11/2020, patient was seen by Dr. Dolphus Jenny for parastomal hernia.  Patient has no obstructive symptoms and would like to continue to monitor her symptoms and hold off any intervention at this point.  Patient was recommended to wear ostomy hernia belt and was referred to [facility] wound ostomy nursing team.   [place] is a 70 y.o. female who has above history reviewed by me today presents for follow up visit for evaluation form metastatic high-grade urothelial carcinoma of the bladder. Sarcomatoid feature.  Patient has been on maintenance immunotherapy.   She is currently on Keytruda every 4 weeks per patient's request. No new complaints.  Chronic fatigue unchanged. During interval 01/29/2021 she went to ER for eval of dark-colored urine noted in her urostomy bag. No fever chills.  She was treated empirically with keflex with pending urine culture.  Urine culture positive for providencia stuartii.  02/02/21 antibiotic was changed to Augmentin.  She reports no dark color urine observed anymore.  She had IV venofer x 1 and felt generalized joint pain. She does not want to have IV venofer done anymore.   Review of Systems  Constitutional:  Negative for appetite change, chills, fatigue and fever.  HENT:   Negative for hearing loss and voice change.   Eyes:  Negative for eye problems.  Respiratory:  Negative for chest tightness and cough.   Cardiovascular:  Negative for chest pain and leg swelling.  Gastrointestinal:  Positive for constipation. Negative for abdominal distention, abdominal  pain and blood in stool.  Endocrine: Negative for hot flashes.  Genitourinary:  Negative for difficulty urinating and frequency.   Musculoskeletal:  Positive for arthralgias.  Skin:   Negative for itching and rash.  Neurological:  Positive for numbness. Negative for extremity weakness.  Hematological:  Negative for adenopathy.  Psychiatric/Behavioral:  Negative for confusion. The patient is not nervous/anxious.    MEDICAL HISTORY:  Past Medical History:  Diagnosis Date   Carotid artery plaque, right 01/2014   CKD (chronic kidney disease)    stage 2-3   Controlled diabetes mellitus type 2 with complications (HCC)    Diabetes mellitus without complication (HCC)    DM (diabetes mellitus), type 2, uncontrolled (HCC)    Hyperlipidemia    Hypertension    Hypochromic microcytic anemia    mild   Metastatic urothelial carcinoma ([place]) 03/23/2019   Osteoporosis    Renal insufficiency    Rotator cuff tendonitis, right     SURGICAL HISTORY: Past Surgical History:  Procedure Laterality Date   ABDOMINAL HYSTERECTOMY  2000   due to bleeding and fibroids, partial- still has ovaries   CYSTOSCOPY W/ RETROGRADES Bilateral 07/16/2018   Procedure: CYSTOSCOPY WITH RETROGRADE PYELOGRAM;  Surgeon: Billey Co, MD;  Location: [facility] ORS;  Service: Urology;  Laterality: Bilateral;   IVC FILTER INSERTION N/A 03/30/2019   Procedure: IVC FILTER INSERTION;  Surgeon: Algernon Huxley, MD;  Location: [place] [facility];  Service: Cardiovascular;  Laterality: N/A;   IVC FILTER REMOVAL N/A 06/15/2019   Procedure: IVC FILTER REMOVAL;  Surgeon: Algernon Huxley, MD;  Location: [place] [facility];  Service: Cardiovascular;  Laterality: N/A;   KNEE SURGERY Left 03/17/2013   torn meniscus   PERIPHERAL VASCULAR THROMBECTOMY Right 03/30/2019   Procedure: PERIPHERAL VASCULAR THROMBECTOMY;  Surgeon: Algernon Huxley, MD;  Location: [place] [facility];  Service: Cardiovascular;  Laterality: Right;   PORTA CATH INSERTION N/A 08/06/2018   Procedure: PORTA CATH INSERTION;  Surgeon: Algernon Huxley, MD;  Location: [place] [facility];  Service: Cardiovascular;  Laterality: N/A;   TRANSURETHRAL RESECTION OF  BLADDER TUMOR N/A 07/16/2018   Procedure: TRANSURETHRAL RESECTION OF BLADDER TUMOR (TURBT);  Surgeon: Billey Co, MD;  Location: [facility] ORS;  Service: Urology;  Laterality: N/A;    SOCIAL HISTORY: Social History   Socioeconomic History   Marital status: Single    Spouse name: Not on file   Number of children: 1   Years of education: Not on file   Highest education level: 10th grade  Occupational History   Not on file  Tobacco Use   Smoking status: Former    Types: Cigarettes    Quit date: 06/05/1991    Years since quitting: 29.7   Smokeless tobacco: Never  Vaping Use   Vaping Use: Never used  Substance and Sexual Activity   Alcohol use: No   Drug use: No   Sexual activity: Not Currently  Other Topics Concern   Not on file  Social History Narrative   Working full time   Social Determinants of Radio broadcast assistant Strain: Low Risk    Difficulty of Paying Living Expenses: Not hard at all  Food Insecurity: Not on file  Transportation Needs: Not on file  Physical Activity: Not on file  Stress: Not on file  Social Connections: Not on file  Intimate Partner Violence: Not on file    FAMILY HISTORY: Family History  Problem Relation Age of Onset   Heart disease  Mother    Heart attack Mother    Arthritis Father    Diabetes Brother     ALLERGIES:  has No Known Allergies.  MEDICATIONS:  Current Outpatient Medications  Medication Sig Dispense Refill   amLODipine (NORVASC) 5 MG tablet Take 1 tablet (5 mg total) by mouth daily. 90 tablet 1   amoxicillin-clavulanate (AUGMENTIN) 500-125 MG tablet Take 1 tablet by mouth 2 (two) times daily.     diclofenac sodium (VOLTAREN) 1 % GEL Apply 2 g topically 4 (four) times daily. (Patient taking differently: Apply 2 g topically as needed.) 100 g 2   docusate sodium (COLACE) 100 MG capsule Take 1 capsule (100 mg total) by mouth daily. 90 capsule 0   lidocaine-prilocaine (EMLA) cream Apply 1 application topically as needed.  30 g 3   losartan (COZAAR) 25 MG tablet Take 1 tablet by mouth once daily 90 tablet 0   pembrolizumab (KEYTRUDA) 100 MG/4ML SOLN Inject 200 mg into the vein. Every 42 days,    Last txy 4/05, Next 5/03     polyethylene glycol (MIRALAX / GLYCOLAX) 17 g packet Take 17 g by mouth daily as needed for mild constipation. 14 each 0   rosuvastatin (CRESTOR) 40 MG tablet Take 1 tablet (40 mg total) by mouth daily. 90 tablet 1   RYBELSUS 3 MG TABS Take 1 tablet by mouth once daily 90 tablet 1   polyethylene glycol-electrolytes (NULYTELY) 420 g solution Prepare according to package instructions. Starting at 5:00 PM: Drink one 8 oz glass of mixture every 15 minutes until you finish half of the jug. Five hours prior to procedure, drink 8 oz glass of mixture every 15 minutes until it is all gone. Make sure you do not drink anything 4 hours prior to your procedure. (Patient not taking: Reported on 02/21/2021) 4000 mL 0   No current facility-administered medications for this visit.   Facility-Administered Medications Ordered in Other Visits  Medication Dose Route Frequency Provider Last Rate Last Admin   heparin lock flush 100 UNIT/ML injection              PHYSICAL EXAMINATION: ECOG PERFORMANCE STATUS: 1 - Symptomatic but completely ambulatory Vitals:   02/21/21 0917  BP: 130/80  Pulse: 87  Resp: 18  Temp: (!) 97.2 F (36.2 C)   Filed Weights   02/21/21 0917  Weight: 205 lb 14.4 oz (93.4 kg)    Physical Exam Constitutional:      General: She is not in acute distress.    Comments: Walk independently  HENT:     Head: Normocephalic and atraumatic.  Eyes:     General: No scleral icterus.    Pupils: Pupils are equal, round, and reactive to light.  Cardiovascular:     Rate and Rhythm: Normal rate and regular rhythm.     Heart sounds: Normal heart sounds.  Pulmonary:     Effort: Pulmonary effort is normal. No respiratory distress.     Breath sounds: No wheezing.  Abdominal:     General: Bowel  sounds are normal. There is no distension.     Palpations: Abdomen is soft. There is no mass.     Comments: Ureterostomy,   Musculoskeletal:        General: No deformity. Normal range of motion.     Cervical back: Normal range of motion and neck supple.  Skin:    General: Skin is warm and dry.     Coloration: Skin is not pale.  Findings: No erythema or rash.  Neurological:     Mental Status: She is alert and oriented to person, place, and time. Mental status is at baseline.     Cranial Nerves: No cranial nerve deficit.     Coordination: Coordination normal.  Psychiatric:        Mood and Affect: Mood normal.  .  RADIOGRAPHIC STUDIES: I have personally reviewed the radiological images as listed and agreed with the findings in the report.  CMP Latest Ref Rng & Units 02/21/2021  Glucose 70 - 99 mg/dL 111(H)  BUN 8 - 23 mg/dL 28(H)  Creatinine 0.44 - 1.00 mg/dL 1.56(H)  Sodium 135 - 145 mmol/L 140  Potassium 3.5 - 5.1 mmol/L 4.3  Chloride 98 - 111 mmol/L 109  CO2 22 - 32 mmol/L 23  Calcium 8.9 - 10.3 mg/dL 8.9  Total Protein 6.5 - 8.1 g/dL 7.8  Total Bilirubin 0.3 - 1.2 mg/dL 0.6  Alkaline Phos 38 - 126 U/L 104  AST 15 - 41 U/L 22  ALT 0 - 44 U/L 11   CBC Latest Ref Rng & Units 02/21/2021  WBC 4.0 - 10.5 K/uL 4.7  Hemoglobin 12.0 - 15.0 g/dL 9.4(L)  Hematocrit 36.0 - 46.0 % 30.5(L)  Platelets 150 - 400 K/uL 205    LABORATORY DATA:  I have reviewed the data as listed Lab Results  Component Value Date   WBC 4.7 02/21/2021   HGB 9.4 (L) 02/21/2021   HCT 30.5 (L) 02/21/2021   MCV 78.6 (L) 02/21/2021   PLT 205 02/21/2021   Recent Labs    03/01/20 1301 03/22/20 0846 12/27/20 0838 01/06/21 0829 01/24/21 0838 02/21/21 0847  NA 140   < > 140 142 138 140  K 4.6   < > 4.0 4.6 4.3 4.3  CL 108   < > 108 108* 107 109  CO2 22   < > $R'22 20 23 23  'IS$ GLUCOSE 190*   < > 139* 133* 138* 111*  BUN 33*   < > 27* 24 40* 28*  CREATININE 2.08*   < > 1.57* 1.88* 1.76* 1.56*  CALCIUM  9.3   < > 8.9 9.4 9.1 8.9  GFRNONAA 24*   < > 35*  --  31* 35*  GFRAA 27*  --   --   --   --   --   PROT 7.8   < > 7.5 7.3 7.8 7.8  ALBUMIN 4.3   < > 4.0 4.4 4.1 4.0  AST 23   < > $R'21 14 20 22  'hL$ ALT 13   < > $R'10 7 10 11  'to$ ALKPHOS 87   < > 99 136* 101 104  BILITOT 0.5   < > 0.5 0.2 0.5 0.6   < > = values in this interval not displayed.    Iron/TIBC/Ferritin/ %Sat    Component Value Date/Time   IRON 62 01/24/2021 0838   IRON 26 (L) 02/04/2019 1309   TIBC 315 01/24/2021 0838   TIBC 251 02/04/2019 1309   FERRITIN 239 01/24/2021 0838   FERRITIN 409 (H) 02/04/2019 1309   IRONPCTSAT 20 01/24/2021 0838   IRONPCTSAT 10 (L) 02/04/2019 1309    RADIOGRAPHIC STUDIES: I have personally reviewed the radiological images as listed and agreed with the findings in the report. CT ABDOMEN PELVIS W CONTRAST  Result Date: 12/13/2020 CLINICAL DATA:  Generalized abdominal pain for 3 weeks, history of bladder cancer EXAM: CT ABDOMEN AND PELVIS WITH CONTRAST TECHNIQUE: Multidetector CT imaging  of the abdomen and pelvis was performed using the standard protocol following bolus administration of intravenous contrast. CONTRAST:  51mL OMNIPAQUE IOHEXOL 300 MG/ML SOLN, additional oral enteric contrast COMPARISON:  08/19/2020 FINDINGS: Lower chest: No acute abnormality. Hepatobiliary: No solid liver abnormality is seen. No gallstones, gallbladder wall thickening, or biliary dilatation. Pancreas: Unremarkable. No pancreatic ductal dilatation or surrounding inflammatory changes. Spleen: Normal in size without significant abnormality. Adrenals/Urinary Tract: Adrenal glands are unremarkable. Status post cystectomy with right lower quadrant ileal conduit urinary diversion. Mild bilateral hydronephrosis and hydroureter to the ileal conduit, unchanged compared to prior. Stomach/Bowel: Stomach is within normal limits. Appendix appears normal. No evidence of bowel wall thickening, distention, or inflammatory changes.  Vascular/Lymphatic: Aortic atherosclerosis. Right common and external iliac vein stent. No enlarged abdominal or pelvic lymph nodes. Reproductive: Status post hysterectomy. Other: Right lower quadrant ileal conduit urinary diversion with a parastomal hernia containing nonobstructed loops of distal small bowel. Small volume ascites in the low pelvis, similar to prior examination. Musculoskeletal: No acute or significant osseous findings. IMPRESSION: 1. No acute CT findings of the abdomen or pelvis to explain pain. 2. Status post cystectomy with right lower quadrant ileal conduit urinary diversion. Mild bilateral hydronephrosis and hydroureter to the ileal conduit, unchanged compared to prior examination. 3. Parastomal hernia containing nonobstructed loops of distal small bowel. 4. Small volume ascites in the low pelvis, similar to prior examination. Aortic Atherosclerosis (ICD10-I70.0). Electronically Signed   By: Eddie Candle M.D.   On: 12/13/2020 12:09      ASSESSMENT & PLAN:  1. Metastatic urothelial carcinoma ([place])   2. Encounter for antineoplastic immunotherapy   3. Anemia in stage 3b chronic kidney disease [facility])   Cancer Staging Bladder carcinoma [facility]) Staging form: Urinary Bladder, AJCC 8th Edition - Clinical stage from 08/01/2018: Stage IVA (cTX, cN3, cM1a) - Signed by [name], MD on 04/21/2019  #Metastatic urothelial carcinoma of bladder, sarcomatoid features pelvic sidewall mass biopsy showed metastatic carcinoma consistent with involvement by urothelial carcinoma., Stage IV NED-  Labs are reviewed and discussed with patient. Proceed with Keytruda 200 mg.  Every 4 weeks per patient's preference.  #Hernia -parastomal hernia.   She has establish care with [facility] hernia center.  She was recommended to monitor her symptoms and aware ostomy belt  #Chronic kidney disease, creatinine has improved to her baseline.  Encourage oral hydration.  #Chronic anemia secondary to stage III CKD.   Hemoglobin has further decreased to 9.3 today.   Had IV venofer, did not tolerate. Prefers no more IV venofer.   #Follow-up in 4 weeks for repeat blood work, MD evaluation and Keytruda and Venofer.  Patient prefers every 4 weeks treatment  .All questions were answered. The patient knows to call the clinic with any problems questions or concerns.  [name], MD, PhD 02/21/21

## 2021-02-21 NOTE — Patient Instructions (Signed)
CANCER CENTER [place] [facility] MEDICAL [facility]COLOG[facility]  Discharge Instructions: Thank you for choosing [facility] to provide your oncology and hematology care.  If you have a lab appointment with the Cancer Center, please go directly to the Cancer Center and check in at the registration area.  Wear comfortable clothing and clothing appropriate for easy access to any Portacath or PICC line.   We strive to give you quality time with your provider. You may need to reschedule your appointment if you arrive late (15 or more minutes).  Arriving late affects you and other patients whose appointments are after yours.  Also, if you miss three or more appointments without notifying the office, you may be dismissed from the clinic at the provider's discretion.      For prescription refill requests, have your pharmacy contact our office and allow 72 hours for refills to be completed.    Today you received the following chemotherapy and/or immunotherapy agents : Keytruda    To help prevent nausea and vomiting after your treatment, we encourage you to take your nausea medication as directed.  BELOW ARE SYMPTOMS THAT SHOULD BE REPORTED IMMEDIATELY: *FEVER GREATER THAN 100.4 F (38 C) OR HIGHER *CHILLS OR SWEATING *NAUSEA AND VOMITING THAT IS NOT CONTROLLED WITH YOUR NAUSEA MEDICATION *UNUSUAL SHORTNESS OF BREATH *UNUSUAL BRUISING OR BLEEDING *URINARY PROBLEMS (pain or burning when urinating, or frequent urination) *BOWEL PROBLEMS (unusual diarrhea, constipation, pain near the anus) TENDERNESS IN MOUTH AND THROAT WITH OR WITHOUT PRESENCE OF ULCERS (sore throat, sores in mouth, or a toothache) UNUSUAL RASH, SWELLING OR PAIN  UNUSUAL VAGINAL DISCHARGE OR ITCHING   Items with * indicate a potential emergency and should be followed up as soon as possible or go to the Emergency Department if any problems should occur.  Please show the CHEMOTHERAPY ALERT CARD or IMMUNOTHERAPY ALERT CARD at check-in  to the Emergency Department and triage nurse.  Should you have questions after your visit or need to cancel or reschedule your appointment, please contact CANCER CENTER [place] [facility] ONCOLOG[facility]  [phone] and follow the prompts.  Office hours are 8:00 a.m. to 4:30 p.m. Monday - Friday. Please note that voicemails left after 4:00 p.m. may not be returned until the following business day.  We are closed weekends and major holidays. You have access to a nurse at all times for urgent questions. Please call the main number to the clinic [phone] and follow the prompts.  For any non-urgent questions, you may also contact your provider using MyChart. We now offer e-Visits for anyone 18 and older to request care online for non-urgent symptoms. For details visit [email or web address].   Also download the MyChart app! Go to the app store, search "MyChart", open the app, select [facility], and log in with your MyChart username and password.  Due to Covid, a mask is required upon entering the hospital/clinic. If you do not have a mask, one will be given to you upon arrival. For doctor visits, patients may have 1 support person aged 18 or older with them. For treatment visits, patients cannot have anyone with them due to current Covid guidelines and our immunocompromised population.  

## 2021-02-28 ENCOUNTER — Telehealth: Payer: Self-pay

## 2021-02-28 NOTE — Telephone Encounter (Signed)
Copied from [place] [id number]. Topic: General - Other >> Feb 27, 2021  4:38 PM Celene Kras wrote: Reason for CRM: Pt called stating that she received a call from PCP and is requesting to have a call back. No documentation left on chart. Please advise.

## 2021-02-28 NOTE — Telephone Encounter (Signed)
Left message for patient to give our office a call back.

## 2021-03-01 NOTE — Telephone Encounter (Signed)
Left message returning patients call

## 2021-03-17 ENCOUNTER — Ambulatory Visit: Payer: Medicare Other | Admitting: Anesthesiology

## 2021-03-17 ENCOUNTER — Ambulatory Visit
Admission: RE | Admit: 2021-03-17 | Discharge: 2021-03-17 | Disposition: A | Discharge status: Home / Self Care | Payer: Medicare Other | Attending: Gastroenterology | Admitting: Gastroenterology

## 2021-03-17 ENCOUNTER — Encounter: Payer: Self-pay | Admitting: Gastroenterology

## 2021-03-17 ENCOUNTER — Other Ambulatory Visit: Payer: Self-pay

## 2021-03-17 ENCOUNTER — Encounter
Admission: RE | Disposition: A | Discharge status: Home / Self Care | Payer: Self-pay | Source: Home / Self Care | Attending: Gastroenterology

## 2021-03-17 DIAGNOSIS — K635 Polyp of colon: Secondary | ICD-10-CM | POA: Diagnosis not present

## 2021-03-17 DIAGNOSIS — D126 Benign neoplasm of colon, unspecified: Secondary | ICD-10-CM

## 2021-03-17 DIAGNOSIS — E785 Hyperlipidemia, unspecified: Secondary | ICD-10-CM | POA: Insufficient documentation

## 2021-03-17 DIAGNOSIS — E119 Type 2 diabetes mellitus without complications: Secondary | ICD-10-CM | POA: Insufficient documentation

## 2021-03-17 DIAGNOSIS — K640 First degree hemorrhoids: Secondary | ICD-10-CM | POA: Diagnosis not present

## 2021-03-17 DIAGNOSIS — I10 Essential (primary) hypertension: Secondary | ICD-10-CM | POA: Diagnosis not present

## 2021-03-17 DIAGNOSIS — Z6836 Body mass index (BMI) 36.0-36.9, adult: Secondary | ICD-10-CM | POA: Insufficient documentation

## 2021-03-17 DIAGNOSIS — K649 Unspecified hemorrhoids: Secondary | ICD-10-CM | POA: Diagnosis not present

## 2021-03-17 DIAGNOSIS — I129 Hypertensive chronic kidney disease with stage 1 through stage 4 chronic kidney disease, or unspecified chronic kidney disease: Secondary | ICD-10-CM | POA: Insufficient documentation

## 2021-03-17 DIAGNOSIS — D122 Benign neoplasm of ascending colon: Secondary | ICD-10-CM | POA: Insufficient documentation

## 2021-03-17 DIAGNOSIS — D125 Benign neoplasm of sigmoid colon: Secondary | ICD-10-CM | POA: Diagnosis not present

## 2021-03-17 DIAGNOSIS — N184 Chronic kidney disease, stage 4 (severe): Secondary | ICD-10-CM | POA: Diagnosis not present

## 2021-03-17 DIAGNOSIS — Z87891 Personal history of nicotine dependence: Secondary | ICD-10-CM | POA: Insufficient documentation

## 2021-03-17 DIAGNOSIS — D509 Iron deficiency anemia, unspecified: Secondary | ICD-10-CM

## 2021-03-17 DIAGNOSIS — E669 Obesity, unspecified: Secondary | ICD-10-CM | POA: Diagnosis not present

## 2021-03-17 DIAGNOSIS — E1122 Type 2 diabetes mellitus with diabetic chronic kidney disease: Secondary | ICD-10-CM | POA: Insufficient documentation

## 2021-03-17 HISTORY — PX: COLONOSCOPY WITH PROPOFOL: SHX5780

## 2021-03-17 HISTORY — PX: ESOPHAGOGASTRODUODENOSCOPY: SHX5428

## 2021-03-17 LAB — GLUCOSE, CAPILLARY: Glucose-Capillary: 106 mg/dL — ABNORMAL HIGH (ref 70–99)

## 2021-03-17 SURGERY — COLONOSCOPY WITH PROPOFOL
Anesthesia: General

## 2021-03-17 MED ORDER — SODIUM CHLORIDE 0.9 % IV SOLN: INTRAVENOUS | Status: DC

## 2021-03-17 MED ORDER — PROPOFOL 500 MG/50ML IV EMUL
INTRAVENOUS | Status: DC | PRN
  Administered 2021-03-17: 150 ug/kg/min via INTRAVENOUS

## 2021-03-17 MED ORDER — LIDOCAINE HCL (CARDIAC) PF 100 MG/5ML IV SOSY
PREFILLED_SYRINGE | INTRAVENOUS | Status: DC | PRN
  Administered 2021-03-17: 100 mg via INTRAVENOUS

## 2021-03-17 MED ORDER — ACETAMINOPHEN 160 MG/5ML PO SOLN
325.0000 mg | ORAL | Status: DC | PRN
  Filled 2021-03-17: qty 20.3

## 2021-03-17 MED ORDER — PROPOFOL 10 MG/ML IV BOLUS
INTRAVENOUS | Status: DC | PRN
  Administered 2021-03-17: 60 mg via INTRAVENOUS
  Administered 2021-03-17 (×5): 20 mg via INTRAVENOUS

## 2021-03-17 MED ORDER — ONDANSETRON HCL 4 MG/2ML IJ SOLN: 4.0000 mg | Freq: Once | INTRAMUSCULAR | Status: DC | PRN

## 2021-03-17 MED ORDER — ACETAMINOPHEN 325 MG PO TABS: 650.0000 mg | ORAL_TABLET | Freq: Once | ORAL | Status: DC | PRN

## 2021-03-17 NOTE — Anesthesia Postprocedure Evaluation (Signed)
Anesthesia Post Note  Patient: Angela Russell  Procedure(s) Performed: COLONOSCOPY WITH PROPOFOL ESOPHAGOGASTRODUODENOSCOPY (EGD)  Patient location during evaluation: PACU Anesthesia Type: General Level of consciousness: awake and alert, oriented and patient cooperative Pain management: pain level controlled Vital Signs Assessment: post-procedure vital signs reviewed and stable Respiratory status: spontaneous breathing, nonlabored ventilation and respiratory function stable Cardiovascular status: blood pressure returned to baseline and stable Postop Assessment: adequate PO intake Anesthetic complications: no   No notable events documented.   Last Vitals:  Vitals:   03/17/21 0905 03/17/21 0915  BP: (!) 158/84 (!) 154/85  Pulse: 87 82  Resp: 18 19  Temp:    SpO2: 99% 99%    Last Pain:  Vitals:   03/17/21 0915  TempSrc:   PainSc: 0-No pain                 Darrin Nipper

## 2021-03-17 NOTE — Anesthesia Procedure Notes (Signed)
Date/Time: 03/17/2021 8:30 AM Performed by: Lily Peer, Kimberlyann Hollar, CRNA Pre-anesthesia Checklist: Patient identified, Emergency Drugs available, Suction available, Patient being monitored and Timeout performed Patient Re-evaluated:Patient Re-evaluated prior to induction Oxygen Delivery Method: Simple face mask Induction Type: IV induction

## 2021-03-17 NOTE — Anesthesia Preprocedure Evaluation (Signed)
Anesthesia Evaluation  Patient identified by MRN, date of birth, ID band Patient awake    Reviewed: Allergy & Precautions, NPO status , Patient's Chart, lab work & pertinent test results  History of Anesthesia Complications Negative for: history of anesthetic complications  Airway Mallampati: II   Neck ROM: Full    Dental  (+) Edentulous Upper, Edentulous Lower   Pulmonary former smoker (quit 1993),    Pulmonary exam normal breath sounds clear to auscultation       Cardiovascular hypertension, Normal cardiovascular exam Rhythm:Regular Rate:Normal     Neuro/Psych negative neurological ROS     GI/Hepatic negative GI ROS,   Endo/Other  diabetes, Type 2Obesity   Renal/GU Renal disease (stage IV CKD)   Urothelial cancer    Musculoskeletal   Abdominal   Peds  Hematology  (+) Blood dyscrasia, anemia ,   Anesthesia Other Findings   Reproductive/Obstetrics                             Anesthesia Physical Anesthesia Plan  ASA: 3  Anesthesia Plan: General   Post-op Pain Management:    Induction: Intravenous  PONV Risk Score and Plan: 3 and Propofol infusion, TIVA and Treatment may vary due to age or medical condition  Airway Management Planned: Natural Airway  Additional Equipment:   Intra-op Plan:   Post-operative Plan:   Informed Consent: I have reviewed the patients History and Physical, chart, labs and discussed the procedure including the risks, benefits and alternatives for the proposed anesthesia with the patient or authorized representative who has indicated his/her understanding and acceptance.       Plan Discussed with: CRNA  Anesthesia Plan Comments:         Anesthesia Quick Evaluation

## 2021-03-17 NOTE — Op Note (Signed)
[facility] Gastroenterology Patient Name: Angela Russell Procedure Date: 03/17/2021 8:12 AM MRN: [id number] Account #: [id number] Date of Birth: May 21, 1951 Admit Type: Outpatient Age: 70 Room: [facility] ENDO ROOM 3 Gender: Female Note Status: Finalized Instrument Name: Jasper Riling [id number] Procedure:             Colonoscopy Indications:           Iron deficiency anemia Providers:             Jonathon Bellows MD, MD Medicines:             Monitored Anesthesia Care Complications:         No immediate complications. Procedure:             Pre-Anesthesia Assessment:                        - Prior to the procedure, a History and Physical was                         performed, and patient medications, allergies and                         sensitivities were reviewed. The patient's tolerance                         of previous anesthesia was reviewed.                        - The risks and benefits of the procedure and the                         sedation options and risks were discussed with the                         patient. All questions were answered and informed                         consent was obtained.                        - ASA Grade Assessment: II - A patient with mild                         systemic disease.                        After obtaining informed consent, the colonoscope was                         passed under direct vision. Throughout the procedure,                         the patient's blood pressure, pulse, and oxygen                         saturations were monitored continuously. The                         Colonoscope was introduced through the anus and  advanced to the the cecum, identified by the                         appendiceal orifice. The colonoscopy was performed                         with ease. The patient tolerated the procedure well.                         The quality of the bowel preparation was  good. Findings:      The perianal and digital rectal examinations were normal.      Non-bleeding internal hemorrhoids were found during retroflexion. The       hemorrhoids were medium-sized and Grade I (internal hemorrhoids that do       not prolapse).      Ten sessile polyps were found in the ascending colon. The polyps were 5       to 7 mm in size. These polyps were removed with a cold snare. Resection       and retrieval were complete.      Two sessile polyps were found in the transverse colon. The polyps were 5       to 6 mm in size. These polyps were removed with a cold snare. Resection       and retrieval were complete.      Two sessile polyps were found in the sigmoid colon. The polyps were 5 to       6 mm in size. These polyps were removed with a cold snare. Resection and       retrieval were complete.      The exam was otherwise without abnormality on direct and retroflexion       views. Impression:            - Non-bleeding internal hemorrhoids.                        - Ten 5 to 7 mm polyps in the ascending colon, removed                         with a cold snare. Resected and retrieved.                        - Two 5 to 6 mm polyps in the transverse colon,                         removed with a cold snare. Resected and retrieved.                        - Two 5 to 6 mm polyps in the sigmoid colon, removed                         with a cold snare. Resected and retrieved.                        - The examination was otherwise normal on direct and                         retroflexion views. Recommendation:        -  Discharge patient to home (with escort).                        - Resume previous diet.                        - To visualize the small bowel, perform video capsule                         endoscopy in 2 weeks.                        - Return to my office in 6 weeks. Procedure Code(s):     --- Professional ---                        [phone], Colonoscopy, flexible;  with removal of                         tumor(s), polyp(s), or other lesion(s) by snare                         technique Diagnosis Code(s):     --- Professional ---                        K63.5, Polyp of colon                        K64.0, First degree hemorrhoids                        D50.9, Iron deficiency anemia, unspecified CPT copyright 2019 American Medical Association. All rights reserved. The codes documented in this report are preliminary and upon coder review may  be revised to meet current compliance requirements. Jonathon Bellows, MD Jonathon Bellows MD, MD 03/17/2021 8:55:09 AM This report has been signed electronically. Number of Addenda: 0 Note Initiated On: 03/17/2021 8:12 AM Scope Withdrawal Time: 0 hours 15 minutes 50 seconds  Total Procedure Duration: 0 hours 21 minutes 56 seconds  Estimated Blood Loss:  Estimated blood loss: none.      [facility]

## 2021-03-17 NOTE — H&P (Signed)
Jonathon Bellows, MD [address] [place], [place], [place], [address] [id number] [place], [place], [place], [address] Phone: [phone]  Fax: [phone]  Primary Care Physician:  [name], NP   Pre-Procedure History & Physical: HPI:  Angela Russell is a 70 y.o. female is here for an endoscopy and colonoscopy    Past Medical History:  Diagnosis Date   Carotid artery plaque, right 01/2014   CKD (chronic kidney disease)    stage 2-3   Controlled diabetes mellitus type 2 with complications ([place])    Diabetes mellitus without complication ([place])    DM (diabetes mellitus), type 2, uncontrolled    Hyperlipidemia    Hypertension    Hypochromic microcytic anemia    mild   Metastatic urothelial carcinoma ([place]) 03/23/2019   Osteoporosis    Renal insufficiency    Rotator cuff tendonitis, right     Past Surgical History:  Procedure Laterality Date   ABDOMINAL HYSTERECTOMY  2000   due to bleeding and fibroids, partial- still has ovaries   CYSTOSCOPY W/ RETROGRADES Bilateral 07/16/2018   Procedure: CYSTOSCOPY WITH RETROGRADE PYELOGRAM;  Surgeon: Billey Co, MD;  Location: [facility] ORS;  Service: Urology;  Laterality: Bilateral;   IVC FILTER INSERTION N/A 03/30/2019   Procedure: IVC FILTER INSERTION;  Surgeon: Algernon Huxley, MD;  Location: [place] [facility] LAB;  Service: Cardiovascular;  Laterality: N/A;   IVC FILTER REMOVAL N/A 06/15/2019   Procedure: IVC FILTER REMOVAL;  Surgeon: Algernon Huxley, MD;  Location: [place] [facility];  Service: Cardiovascular;  Laterality: N/A;   KNEE SURGERY Left 03/17/2013   torn meniscus   PERIPHERAL VASCULAR THROMBECTOMY Right 03/30/2019   Procedure: PERIPHERAL VASCULAR THROMBECTOMY;  Surgeon: Algernon Huxley, MD;  Location: [place] [facility];  Service: Cardiovascular;  Laterality: Right;   PORTA CATH INSERTION N/A 08/06/2018   Procedure: PORTA CATH INSERTION;  Surgeon: Algernon Huxley, MD;  Location: [place] [facility];  Service:  Cardiovascular;  Laterality: N/A;   TRANSURETHRAL RESECTION OF BLADDER TUMOR N/A 07/16/2018   Procedure: TRANSURETHRAL RESECTION OF BLADDER TUMOR (TURBT);  Surgeon: Billey Co, MD;  Location: [facility] ORS;  Service: Urology;  Laterality: N/A;    Prior to Admission medications   Medication Sig Start Date End Date Taking? Authorizing Provider  amLODipine (NORVASC) 5 MG tablet Take 1 tablet (5 mg total) by mouth daily. 11/04/20  Yes [name], NP  diclofenac sodium (VOLTAREN) 1 % GEL Apply 2 g topically 4 (four) times daily. Patient taking differently: Apply 2 g topically as needed. 04/03/19  Yes Volney American, PA-C  docusate sodium (COLACE) 100 MG capsule Take 1 capsule (100 mg total) by mouth daily. 03/22/20  Yes [name], MD  lidocaine-prilocaine (EMLA) cream Apply 1 application topically as needed. 07/12/20  Yes [name], MD  losartan (COZAAR) 25 MG tablet Take 1 tablet by mouth once daily 02/15/21  Yes [name], NP  pembrolizumab [facility]) 100 MG/4ML SOLN Inject 200 mg into the vein. Every 42 days,    Last txy 4/05, Next 5/03   Yes [provider]  polyethylene glycol (MIRALAX / GLYCOLAX) 17 g packet Take 17 g by mouth daily as needed for mild constipation. 12/21/18  Yes Vaughan Basta, MD  rosuvastatin (CRESTOR) 40 MG tablet Take 1 tablet (40 mg total) by mouth daily. 03/11/20  Yes Eulogio Bear, NP  RYBELSUS 3 MG TABS Take 1 tablet by mouth once daily 11/04/20  Yes [place],  Santiago Glad, NP  amoxicillin-clavulanate (AUGMENTIN) 500-125 MG tablet Take 1 tablet by mouth 2 (two) times daily. 02/02/21   [provider]  polyethylene glycol-electrolytes (NULYTELY) 420 g solution Prepare according to package instructions. Starting at 5:00 PM: Drink one 8 oz glass of mixture every 15 minutes until you finish half of the jug. Five hours prior to procedure, drink 8 oz glass of mixture every 15 minutes until it is all gone. Make sure you do not drink anything 4  hours prior to your procedure. Patient not taking: No sig reported 02/08/21   Jonathon Bellows, MD  prochlorperazine (COMPAZINE) 10 MG tablet Take 1 tablet (10 mg total) by mouth every 6 (six) hours as needed (Nausea or vomiting). 08/03/18 08/26/18  [name], MD    Allergies as of 02/08/2021   (No Known Allergies)    Family History  Problem Relation Age of Onset   Heart disease Mother    Heart attack Mother    Arthritis Father    Diabetes Brother     Social History   Socioeconomic History   Marital status: Single    Spouse name: Not on file   Number of children: 1   Years of education: Not on file   Highest education level: 10th grade  Occupational History   Not on file  Tobacco Use   Smoking status: Former    Types: Cigarettes    Quit date: 06/05/1991    Years since quitting: 29.8   Smokeless tobacco: Never  Vaping Use   Vaping Use: Never used  Substance and Sexual Activity   Alcohol use: No   Drug use: No   Sexual activity: Not Currently  Other Topics Concern   Not on file  Social History Narrative   Working full time   Social Determinants of Radio broadcast assistant Strain: Low Risk    Difficulty of Paying Living Expenses: Not hard at all  Food Insecurity: Not on file  Transportation Needs: Not on file  Physical Activity: Not on file  Stress: Not on file  Social Connections: Not on file  Intimate Partner Violence: Not on file    Review of Systems: See HPI, otherwise negative ROS  Physical Exam: BP (!) 166/76   Pulse 85   Temp (!) 97.5 F (36.4 C) (Temporal)   Resp 18   Ht 5\' 3"  (1.6 m)   Wt 92.5 kg   SpO2 100%   BMI 36.14 kg/m  General:   Alert,  pleasant and cooperative in NAD Head:  Normocephalic and atraumatic. Neck:  Supple; no masses or thyromegaly. Lungs:  Clear throughout to auscultation, normal respiratory effort.    Heart:  +S1, +S2, Regular rate and rhythm, No edema. Abdomen:  Soft, nontender and nondistended. Normal bowel sounds, without  guarding, and without rebound.   Neurologic:  Alert and  oriented x4;  grossly normal neurologically.  Impression/Plan: Angela Russell is here for an endoscopy and colonoscopy  to be performed for  evaluation of iron deficiency anemia     Risks, benefits, limitations, and alternatives regarding endoscopy have been reviewed with the patient.  Questions have been answered.  All parties agreeable.   Jonathon Bellows, MD  03/17/2021, 7:48 AM

## 2021-03-17 NOTE — Op Note (Signed)
[facility] Gastroenterology Patient Name: Angela Russell Procedure Date: 03/17/2021 8:14 AM MRN: [id number] Account #: [id number] Date of Birth: 26-Apr-1951 Admit Type: Outpatient Age: 70 Room: [facility] ENDO ROOM 3 Gender: Female Note Status: Finalized Instrument Name: Upper Endoscope [id number] Procedure:             Upper GI endoscopy Indications:           Iron deficiency anemia Providers:             Jonathon Bellows MD, MD Referring MD:          [name] (Referring MD) Medicines:             Monitored Anesthesia Care Complications:         No immediate complications. Procedure:             Pre-Anesthesia Assessment:                        - Prior to the procedure, a History and Physical was                         performed, and patient medications, allergies and                         sensitivities were reviewed. The patient's tolerance                         of previous anesthesia was reviewed.                        - The risks and benefits of the procedure and the                         sedation options and risks were discussed with the                         patient. All questions were answered and informed                         consent was obtained.                        - ASA Grade Assessment: II - A patient with mild                         systemic disease.                        After obtaining informed consent, the endoscope was                         passed under direct vision. Throughout the procedure,                         the patient's blood pressure, pulse, and oxygen                         saturations were monitored continuously. The Endoscope                         was introduced  through the mouth, and advanced to the                         third part of duodenum. The upper GI endoscopy was                         accomplished with ease. The patient tolerated the                         procedure well. Findings:      The  esophagus was normal.      The stomach was normal.      The examined duodenum was normal.      The cardia and gastric fundus were normal on retroflexion. Impression:            - Normal esophagus.                        - Normal stomach.                        - Normal examined duodenum.                        - No specimens collected. Recommendation:        - Perform a colonoscopy today. Procedure Code(s):     --- Professional ---                        [phone], Esophagogastroduodenoscopy, flexible,                         transoral; diagnostic, including collection of                         specimen(s) by brushing or washing, when performed                         (separate procedure) Diagnosis Code(s):     --- Professional ---                        D50.9, Iron deficiency anemia, unspecified CPT copyright 2019 American Medical Association. All rights reserved. The codes documented in this report are preliminary and upon coder review may  be revised to meet current compliance requirements. Jonathon Bellows, MD Jonathon Bellows MD, MD 03/17/2021 8:28:22 AM This report has been signed electronically. Number of Addenda: 0 Note Initiated On: 03/17/2021 8:14 AM Estimated Blood Loss:  Estimated blood loss: none.      [facility]

## 2021-03-17 NOTE — Transfer of Care (Signed)
Immediate Anesthesia Transfer of Care Note  Patient: Angela Russell  Procedure(s) Performed: COLONOSCOPY WITH PROPOFOL ESOPHAGOGASTRODUODENOSCOPY (EGD)  Patient Location: Endoscopy Unit  Anesthesia Type:General  Level of Consciousness: awake, alert  and oriented  Airway & Oxygen Therapy: Patient Spontanous Breathing  Post-op Assessment: Report given to RN and Post -op Vital signs reviewed and stable  Post vital signs: Reviewed and stable  Last Vitals:  Vitals Value Taken Time  BP 140/85   Temp    Pulse 95 03/17/21 0855  Resp 17 03/17/21 0855  SpO2 100 % 03/17/21 0855  Vitals shown include unvalidated device data.  Last Pain:  Vitals:   03/17/21 0714  TempSrc: Temporal  PainSc: 0-No pain         Complications: No notable events documented.

## 2021-03-20 ENCOUNTER — Encounter: Payer: Self-pay | Admitting: Gastroenterology

## 2021-03-20 LAB — SURGICAL PATHOLOGY

## 2021-03-21 ENCOUNTER — Inpatient Hospital Stay (HOSPITAL_BASED_OUTPATIENT_CLINIC_OR_DEPARTMENT_OTHER): Payer: Medicare Other | Admitting: Oncology

## 2021-03-21 ENCOUNTER — Inpatient Hospital Stay: Payer: Medicare Other | Attending: Oncology

## 2021-03-21 ENCOUNTER — Other Ambulatory Visit: Payer: Self-pay

## 2021-03-21 ENCOUNTER — Inpatient Hospital Stay: Payer: Medicare Other

## 2021-03-21 ENCOUNTER — Encounter: Payer: Self-pay | Admitting: Oncology

## 2021-03-21 VITALS — BP 153/83 | HR 81 | Temp 97.2°F | Resp 18 | Wt 209.1 lb

## 2021-03-21 DIAGNOSIS — N1832 Chronic kidney disease, stage 3b: Secondary | ICD-10-CM

## 2021-03-21 DIAGNOSIS — C679 Malignant neoplasm of bladder, unspecified: Secondary | ICD-10-CM | POA: Diagnosis not present

## 2021-03-21 DIAGNOSIS — E785 Hyperlipidemia, unspecified: Secondary | ICD-10-CM | POA: Diagnosis not present

## 2021-03-21 DIAGNOSIS — D631 Anemia in chronic kidney disease: Secondary | ICD-10-CM | POA: Diagnosis not present

## 2021-03-21 DIAGNOSIS — Z5112 Encounter for antineoplastic immunotherapy: Secondary | ICD-10-CM | POA: Insufficient documentation

## 2021-03-21 DIAGNOSIS — Z87891 Personal history of nicotine dependence: Secondary | ICD-10-CM | POA: Diagnosis not present

## 2021-03-21 DIAGNOSIS — C7910 Secondary malignant neoplasm of unspecified urinary organs: Secondary | ICD-10-CM

## 2021-03-21 DIAGNOSIS — Z86718 Personal history of other venous thrombosis and embolism: Secondary | ICD-10-CM | POA: Insufficient documentation

## 2021-03-21 DIAGNOSIS — Z79899 Other long term (current) drug therapy: Secondary | ICD-10-CM | POA: Insufficient documentation

## 2021-03-21 DIAGNOSIS — R635 Abnormal weight gain: Secondary | ICD-10-CM

## 2021-03-21 DIAGNOSIS — E1165 Type 2 diabetes mellitus with hyperglycemia: Secondary | ICD-10-CM | POA: Insufficient documentation

## 2021-03-21 DIAGNOSIS — I129 Hypertensive chronic kidney disease with stage 1 through stage 4 chronic kidney disease, or unspecified chronic kidney disease: Secondary | ICD-10-CM | POA: Diagnosis not present

## 2021-03-21 DIAGNOSIS — M810 Age-related osteoporosis without current pathological fracture: Secondary | ICD-10-CM | POA: Insufficient documentation

## 2021-03-21 DIAGNOSIS — K435 Parastomal hernia without obstruction or  gangrene: Secondary | ICD-10-CM | POA: Insufficient documentation

## 2021-03-21 DIAGNOSIS — Z7901 Long term (current) use of anticoagulants: Secondary | ICD-10-CM | POA: Diagnosis not present

## 2021-03-21 LAB — COMPREHENSIVE METABOLIC PANEL
ALT: 11 U/L (ref 0–44)
AST: 20 U/L (ref 15–41)
Albumin: 4.1 g/dL (ref 3.5–5.0)
Alkaline Phosphatase: 109 U/L (ref 38–126)
Anion gap: 8 (ref 5–15)
BUN: 25 mg/dL — ABNORMAL HIGH (ref 8–23)
CO2: 23 mmol/L (ref 22–32)
Calcium: 8.7 mg/dL — ABNORMAL LOW (ref 8.9–10.3)
Chloride: 108 mmol/L (ref 98–111)
Creatinine, Ser: 1.39 mg/dL — ABNORMAL HIGH (ref 0.44–1.00)
GFR, Estimated: 41 mL/min — ABNORMAL LOW (ref >60)
Glucose, Bld: 114 mg/dL — ABNORMAL HIGH (ref 70–99)
Potassium: 3.7 mmol/L (ref 3.5–5.1)
Sodium: 139 mmol/L (ref 135–145)
Total Bilirubin: 0.2 mg/dL — ABNORMAL LOW (ref 0.3–1.2)
Total Protein: 7.9 g/dL (ref 6.5–8.1)

## 2021-03-21 LAB — CBC WITH DIFFERENTIAL/PLATELET
Abs Immature Granulocytes: 0.03 10*3/uL (ref 0.00–0.07)
Basophils Absolute: 0 10*3/uL (ref 0.0–0.1)
Basophils Relative: 1 %
Eosinophils Absolute: 0.1 10*3/uL (ref 0.0–0.5)
Eosinophils Relative: 3 %
HCT: 33 % — ABNORMAL LOW (ref 36.0–46.0)
Hemoglobin: 9.9 g/dL — ABNORMAL LOW (ref 12.0–15.0)
Immature Granulocytes: 1 %
Lymphocytes Relative: 26 %
Lymphs Abs: 1.3 10*3/uL (ref 0.7–4.0)
MCH: 23.9 pg — ABNORMAL LOW (ref 26.0–34.0)
MCHC: 30 g/dL (ref 30.0–36.0)
MCV: 79.5 fL — ABNORMAL LOW (ref 80.0–100.0)
Monocytes Absolute: 0.3 10*3/uL (ref 0.1–1.0)
Monocytes Relative: 7 %
Neutro Abs: 3.2 10*3/uL (ref 1.7–7.7)
Neutrophils Relative %: 62 %
Platelets: 254 10*3/uL (ref 150–400)
RBC: 4.15 MIL/uL (ref 3.87–5.11)
RDW: 14.6 % (ref 11.5–15.5)
WBC: 5.1 10*3/uL (ref 4.0–10.5)
nRBC: 0 % (ref 0.0–0.2)

## 2021-03-21 LAB — TSH: TSH: 1.167 u[IU]/mL (ref 0.350–4.500)

## 2021-03-21 MED ORDER — SODIUM CHLORIDE 0.9 % IV SOLN
200.0000 mg | Freq: Once | INTRAVENOUS | Status: AC
  Administered 2021-03-21: 200 mg via INTRAVENOUS
  Filled 2021-03-21: qty 8

## 2021-03-21 MED ORDER — SODIUM CHLORIDE 0.9% FLUSH
10.0000 mL | Freq: Once | INTRAVENOUS | Status: AC
  Administered 2021-03-21: 10 mL via INTRAVENOUS
  Filled 2021-03-21: qty 10

## 2021-03-21 MED ORDER — SODIUM CHLORIDE 0.9 % IV SOLN
Freq: Once | INTRAVENOUS | Status: AC
  Filled 2021-03-21: qty 250

## 2021-03-21 MED ORDER — HEPARIN SOD (PORK) LOCK FLUSH 100 UNIT/ML IV SOLN
INTRAVENOUS | Status: AC
  Administered 2021-03-21: 500 [IU] via INTRAVENOUS
  Filled 2021-03-21: qty 5

## 2021-03-21 MED ORDER — HEPARIN SOD (PORK) LOCK FLUSH 100 UNIT/ML IV SOLN
500.0000 [IU] | Freq: Once | INTRAVENOUS | Status: AC
  Filled 2021-03-21: qty 5

## 2021-03-21 NOTE — Patient Instructions (Signed)
[facility] [place] [facility]  Discharge Instructions: Thank you for choosing [facility] to provide your oncology and hematology care.  If you have a lab appointment with the Cancer Center, please go directly to the Cancer Center and check in at the registration area.  Wear comfortable clothing and clothing appropriate for easy access to any Portacath or PICC line.   We strive to give you quality time with your provider. You may need to reschedule your appointment if you arrive late (15 or more minutes).  Arriving late affects you and other patients whose appointments are after yours.  Also, if you miss three or more appointments without notifying the office, you may be dismissed from the clinic at the provider's discretion.      For prescription refill requests, have your pharmacy contact our office and allow 72 hours for refills to be completed.    Today you received the following chemotherapy and/or immunotherapy agents : Keytruda    To help prevent nausea and vomiting after your treatment, we encourage you to take your nausea medication as directed.  BELOW ARE SYMPTOMS THAT SHOULD BE REPORTED IMMEDIATELY: *FEVER GREATER THAN 100.4 F (38 C) OR HIGHER *CHILLS OR SWEATING *NAUSEA AND VOMITING THAT IS NOT CONTROLLED WITH YOUR NAUSEA MEDICATION *UNUSUAL SHORTNESS OF BREATH *UNUSUAL BRUISING OR BLEEDING *URINARY PROBLEMS (pain or burning when urinating, or frequent urination) *BOWEL PROBLEMS (unusual diarrhea, constipation, pain near the anus) TENDERNESS IN MOUTH AND THROAT WITH OR WITHOUT PRESENCE OF ULCERS (sore throat, sores in mouth, or a toothache) UNUSUAL RASH, SWELLING OR PAIN  UNUSUAL VAGINAL DISCHARGE OR ITCHING   Items with * indicate a potential emergency and should be followed up as soon as possible or go to the Emergency Department if any problems should occur.  Please show the CHEMOTHERAPY ALERT CARD or IMMUNOTHERAPY ALERT CARD at check-in  to the Emergency Department and triage nurse.  Should you have questions after your visit or need to cancel or reschedule your appointment, please contact [facility]  [phone] and follow the prompts.  Office hours are 8:00 a.m. to 4:30 p.m. Monday - Friday. Please note that voicemails left after 4:00 p.m. may not be returned until the following business day.  We are closed weekends and major holidays. You have access to a nurse at all times for urgent questions. Please call the main number to the clinic [phone] and follow the prompts.  For any non-urgent questions, you may also contact your provider using MyChart. We now offer e-Visits for anyone 18 and older to request care online for non-urgent symptoms. For details visit [email or web address].   Also download the MyChart app! Go to the app store, search "MyChart", open the app, select [place], and log in with your MyChart username and password.  Due to Covid, a mask is required upon entering the hospital/clinic. If you do not have a mask, one will be given to you upon arrival. For doctor visits, patients may have 1 support person aged 18 or older with them. For treatment visits, patients cannot have anyone with them due to current Covid guidelines and our immunocompromised population.  

## 2021-03-21 NOTE — Progress Notes (Signed)
Hematology/Oncology follow up note [facility] Telephone:([phone] [phone]   Patient Care Team: [name], NP as PCP - General Nice, [name], OD (Optometry) [name], MD as Consulting Physician (Hematology and Oncology) [name], [facility] (Pharmacist)  REFERRING PROVIDER: Dr.Sninsky CHIEF COMPLAINTS/REASON FOR VISIT:  Follow up for bladder cancer, anemia.   HISTORY OF PRESENTING ILLNESS:  Angela Russell is a  70 y.o.  female with PMH listed below who was referred to me for evaluation of newly diagnosed bladder cancer. Patient initially presented to emergency room at the end of January 2020 for evaluation of dysuria, hematuria and the left lower quadrant inguinal pain and flank pain.  1/28 2020 CT renal stone study showed suspected irregular wall thickening about the superior bladder, not well assessed due to degree of bladder distention.  Recommend cystoscopy for further evaluation.  No renal stone or obstructive uropathy.  Patient was given IV Rocephin and referred patient for outpatient urology follow-up.  Urine culture was negative.  She again presented to ER after 2 days with similar symptoms.  Patient has 25-pack-year smoking history, quit approximately 20 years ago.  No family history of any urology malignancies 07/03/2018 another CT abdomen pelvis with contrast was done which showed no nephrolithiasis or hydronephrosis is identified.  Bladder is decompressed limiting evaluation.  07/16/2018.urology Dr. [name] - cystoscopy and bilateral retrograde pyelogram on 07/16/2018.  Pyelogram did not show any filling defect or abnormalities.  No hydronephrosis.  Ureteral orifice was not involved with tumor.  There is a large 5 cm posterior wall bladder tumor, bullous and sessile appearing.  Patient underwent TURBT.   Pathology: High-grade urothelial carcinoma, invasive into muscularis propria.  Lymphovascular invasion is present.  Carcinoma in situ  is also identified.  Focal squamous differentiation is noted, areas of invasive carcinoma display pleomorphic/sarcomatoid changes.  T2b  08/07/2018 CT without contrast negative.  2 subpleural right upper lobe nodule 2 to 3 mm likely benign. She also had baseline audiometry done.  08/04/2018 ddMVAC x 1 cycle, stopped due to intolerance and AKI.  Patient received 1 cycle of dd MVAC, not able to tolerate due to AKI. Patient then was referred to [facility] urology  09/22/2018 patient underwent a cystectomy, pathology pT3a N0 Mx.  11 lymph nodes were harvested and was all negative. Invasive urothelia carcinoma, high grade, with sarcomatoid features.   10/14/2018 patient was admitted due to pyelonephritis and a pelvic fluid collection.  Drain was placed. 10/23/2018 drain was removed. Patient has had difficulties getting to her appointments to [facility]. 02/09/2019, patient presented with abdominal pain. CT concerning for small bowel obstruction and increased size of right pelvic fluid collection concerning for cancer recurrence.  Right hydronephrosis to the level of pelvis and enlarged lymph nodes. Patient had JP drain placed with CT guidance to pelvic fluid collection and drained 400 cc amber fluid.  Culture was negative for growth of microorganisms and cytology was negative for malignancy.-JP drain was removed on the day of discharge. CT-guided core biopsy of pelvic lymph node adenopathy was attempted but not successful.  # Right lower extremity DVT, provoked by Transvenous biopsy  02/26/2019 transvenous biopsy by IR unsuccessful transvenous biopsy of the right pelvis mass. Post biopsy acute thrombus in the right external iliac and common femoral vein.  Patient was recommended to start anticoagulation with Xarelto however due to the co-pay, patient is not able to afford the medication. Anticoagulation regimen was switched to Eliquis 5 mg.  Patient reports that she has  been taking it once a day. 03/20/2019  PET showed FDG avid tissue in the cystectomy bed, retroperitoneal and pelvic lymphadenopathy.  Right common iliac DVT.  # increased right lower extremity swelling.  She was started on Eliquis for anticoagulation by [facility].  We clarified with her pharmacy and she was actually taking Eliquis 2.5 mg twice daily. Right lower extremity swelling has not improved but instead worsened. 03/16/2019 She had ultrasound right lower extremity done which showed persistent extensive proximal right lower extremity DVT.  Anticoagulation regimen has increased to Eliquis 5 mg twice daily.  # establish care with [facility] oncology Dr. [name] for evaluation.  Dr. [name] recommended starting immunotherapy with PD-L1 inhibitor Pembrolizumab 200 mg every 3 weeks.  The sarcomatoid histology may not respond to chemotherapy well, could portend a better chance of response into immunotherapy PET scan after 4 cycles of Keytruda showed single mildly enlarged central mesenteric lymph node in the upper pelvis with SUV 9.6.  No other abnormal hypermetabolic activity was reported.  # #Right lower extremity  femoral vein DVT provoked by transvenous biopsy in the context of cancer recurrence, Status post IV C filter and mechanical thrombectomy.  IVC filter has been removed. She is currently off anticoagulation after she developed hematuria.  # 03/30/2019 Status post IVC filter placement, mechanical thrombectomy.-IVC filter was retrieved in January 2021. # PD-L1 CPS 100%.  # 03/31/2019 started on immunotherapy Keytruda.  # 04/26/2020 CT chest abdomen pelvis was reviewed and discussed with patient. No definitive finding of disease recurrence or metastasis.  Small amount of ascites similar to prior. There is a parastomal hernia containing a small margin of adjacent small bowel.  This is associated with wall thickening in approximately 7 cm segment of bowel adjacent to the hernia.  There is adjacent abnormal stranding of the mesentery and  omentum.  # 08/19/2020, CT chest abdomen pelvis showed no evidence of new/progressive metastatic disease. NED Hold off treatment due to uncontrolled hypotension.  # Chronic abdominal discomfort due to hernia. 08/11/2020, patient was seen by Dr. Dolphus Jenny for parastomal hernia.  Patient has no obstructive symptoms and would like to continue to monitor her symptoms and hold off any intervention at this point.  Patient was recommended to wear ostomy hernia belt and was referred to [facility] wound ostomy nursing team.  She received IV venofer previously and did not tolerate due to energized joint pain afterwards.   INTERVAL HISTORY Angela Russell is a 70 y.o. female who has above history reviewed by me today presents for follow up visit for evaluation form metastatic high-grade urothelial carcinoma of the bladder. Sarcomatoid feature.  Patient has been on maintenance immunotherapy.   She is currently on Keytruda every 4 weeks per patient's request. No new complains. Denies any abdominal pain.  During interval, patient was seen by Dr. Vicente Males and had colonoscopy done.  Noncancerous polyps were resected.  Review of Systems  Constitutional:  Negative for appetite change, chills, fatigue and fever.  HENT:   Negative for hearing loss and voice change.   Eyes:  Negative for eye problems.  Respiratory:  Negative for chest tightness and cough.   Cardiovascular:  Negative for chest pain and leg swelling.  Gastrointestinal:  Positive for constipation. Negative for abdominal distention, abdominal pain and blood in stool.  Endocrine: Negative for hot flashes.  Genitourinary:  Negative for difficulty urinating and frequency.   Musculoskeletal:  Positive for arthralgias.  Skin:  Negative for itching and rash.  Neurological:  Positive for numbness. Negative for  extremity weakness.  Hematological:  Negative for adenopathy.  Psychiatric/Behavioral:  Negative for confusion. The patient is not nervous/anxious.     MEDICAL HISTORY:  Past Medical History:  Diagnosis Date   Carotid artery plaque, right 01/2014   CKD (chronic kidney disease)    stage 2-3   Controlled diabetes mellitus type 2 with complications (HCC)    Diabetes mellitus without complication (HCC)    DM (diabetes mellitus), type 2, uncontrolled    Hyperlipidemia    Hypertension    Hypochromic microcytic anemia    mild   Metastatic urothelial carcinoma ([place]) 03/23/2019   Osteoporosis    Renal insufficiency    Rotator cuff tendonitis, right     SURGICAL HISTORY: Past Surgical History:  Procedure Laterality Date   ABDOMINAL HYSTERECTOMY  2000   due to bleeding and fibroids, partial- still has ovaries   COLONOSCOPY WITH PROPOFOL N/A 03/17/2021   Procedure: COLONOSCOPY WITH PROPOFOL;  Surgeon: Jonathon Bellows, MD;  Location: [facility] ENDOSCOPY;  Service: Gastroenterology;  Laterality: N/A;   CYSTOSCOPY W/ RETROGRADES Bilateral 07/16/2018   Procedure: CYSTOSCOPY WITH RETROGRADE PYELOGRAM;  Surgeon: Billey Co, MD;  Location: [facility] ORS;  Service: Urology;  Laterality: Bilateral;   ESOPHAGOGASTRODUODENOSCOPY  03/17/2021   Procedure: ESOPHAGOGASTRODUODENOSCOPY (EGD);  Surgeon: Jonathon Bellows, MD;  Location: [facility] ENDOSCOPY;  Service: Gastroenterology;;   IVC FILTER INSERTION N/A 03/30/2019   Procedure: IVC FILTER INSERTION;  Surgeon: Algernon Huxley, MD;  Location: [place] CV LAB;  Service: Cardiovascular;  Laterality: N/A;   IVC FILTER REMOVAL N/A 06/15/2019   Procedure: IVC FILTER REMOVAL;  Surgeon: Algernon Huxley, MD;  Location: [place] CV LAB;  Service: Cardiovascular;  Laterality: N/A;   KNEE SURGERY Left 03/17/2013   torn meniscus   PERIPHERAL VASCULAR THROMBECTOMY Right 03/30/2019   Procedure: PERIPHERAL VASCULAR THROMBECTOMY;  Surgeon: Algernon Huxley, MD;  Location: [place] CV LAB;  Service: Cardiovascular;  Laterality: Right;   PORTA CATH INSERTION N/A 08/06/2018   Procedure: PORTA CATH INSERTION;  Surgeon: Algernon Huxley,  MD;  Location: [place] CV LAB;  Service: Cardiovascular;  Laterality: N/A;   TRANSURETHRAL RESECTION OF BLADDER TUMOR N/A 07/16/2018   Procedure: TRANSURETHRAL RESECTION OF BLADDER TUMOR (TURBT);  Surgeon: Billey Co, MD;  Location: [facility] ORS;  Service: Urology;  Laterality: N/A;    SOCIAL HISTORY: Social History   Socioeconomic History   Marital status: Single    Spouse name: Not on file   Number of children: 1   Years of education: Not on file   Highest education level: 10th grade  Occupational History   Not on file  Tobacco Use   Smoking status: Former    Types: Cigarettes    Quit date: 06/05/1991    Years since quitting: 29.8   Smokeless tobacco: Never  Vaping Use   Vaping Use: Never used  Substance and Sexual Activity   Alcohol use: No   Drug use: No   Sexual activity: Not Currently  Other Topics Concern   Not on file  Social History Narrative   Working full time   Social Determinants of Radio broadcast assistant Strain: Low Risk    Difficulty of Paying Living Expenses: Not hard at all  Food Insecurity: Not on file  Transportation Needs: Not on file  Physical Activity: Not on file  Stress: Not on file  Social Connections: Not on file  Intimate Partner Violence: Not on file    FAMILY HISTORY: Family History  Problem Relation Age  of Onset   Heart disease Mother    Heart attack Mother    Arthritis Father    Diabetes Brother     ALLERGIES:  has No Known Allergies.  MEDICATIONS:  Current Outpatient Medications  Medication Sig Dispense Refill   amLODipine (NORVASC) 5 MG tablet Take 1 tablet (5 mg total) by mouth daily. 90 tablet 1   diclofenac sodium (VOLTAREN) 1 % GEL Apply 2 g topically 4 (four) times daily. (Patient taking differently: Apply 2 g topically as needed.) 100 g 2   docusate sodium (COLACE) 100 MG capsule Take 1 capsule (100 mg total) by mouth daily. 90 capsule 0   lidocaine-prilocaine (EMLA) cream Apply 1 application topically as  needed. 30 g 3   losartan (COZAAR) 25 MG tablet Take 1 tablet by mouth once daily 90 tablet 0   pembrolizumab (KEYTRUDA) 100 MG/4ML SOLN Inject 200 mg into the vein. Every 42 days,    Last txy 4/05, Next 5/03     polyethylene glycol (MIRALAX / GLYCOLAX) 17 g packet Take 17 g by mouth daily as needed for mild constipation. 14 each 0   rosuvastatin (CRESTOR) 40 MG tablet Take 1 tablet (40 mg total) by mouth daily. 90 tablet 1   RYBELSUS 3 MG TABS Take 1 tablet by mouth once daily 90 tablet 1   No current facility-administered medications for this visit.   Facility-Administered Medications Ordered in Other Visits  Medication Dose Route Frequency Provider Last Rate Last Admin   heparin lock flush 100 UNIT/ML injection            heparin lock flush 100 unit/mL  500 Units Intravenous Once [name], MD         PHYSICAL EXAMINATION: ECOG PERFORMANCE STATUS: 1 - Symptomatic but completely ambulatory Vitals:   03/21/21 0941  BP: (!) 153/83  Pulse: 81  Resp: 18  Temp: (!) 97.2 F (36.2 C)   Filed Weights   03/21/21 0941  Weight: 209 lb 1.6 oz (94.8 kg)    Physical Exam Constitutional:      General: She is not in acute distress.    Comments: Walk independently  HENT:     Head: Normocephalic and atraumatic.  Eyes:     General: No scleral icterus.    Pupils: Pupils are equal, round, and reactive to light.  Cardiovascular:     Rate and Rhythm: Normal rate and regular rhythm.     Heart sounds: Normal heart sounds.  Pulmonary:     Effort: Pulmonary effort is normal. No respiratory distress.     Breath sounds: No wheezing.  Abdominal:     General: Bowel sounds are normal. There is no distension.     Palpations: Abdomen is soft. There is no mass.     Comments: Ureterostomy,   Musculoskeletal:        General: No deformity. Normal range of motion.     Cervical back: Normal range of motion and neck supple.  Skin:    General: Skin is warm and dry.     Coloration: Skin is not pale.      Findings: No erythema or rash.  Neurological:     Mental Status: She is alert and oriented to person, place, and time. Mental status is at baseline.     Cranial Nerves: No cranial nerve deficit.     Coordination: Coordination normal.  Psychiatric:        Mood and Affect: Mood normal.  .  RADIOGRAPHIC STUDIES: I have personally  reviewed the radiological images as listed and agreed with the findings in the report.  CMP Latest Ref Rng & Units 03/21/2021  Glucose 70 - 99 mg/dL 114(H)  BUN 8 - 23 mg/dL 25(H)  Creatinine 0.44 - 1.00 mg/dL 1.39(H)  Sodium 135 - 145 mmol/L 139  Potassium 3.5 - 5.1 mmol/L 3.7  Chloride 98 - 111 mmol/L 108  CO2 22 - 32 mmol/L 23  Calcium 8.9 - 10.3 mg/dL 8.7(L)  Total Protein 6.5 - 8.1 g/dL 7.9  Total Bilirubin 0.3 - 1.2 mg/dL 0.2(L)  Alkaline Phos 38 - 126 U/L 109  AST 15 - 41 U/L 20  ALT 0 - 44 U/L 11   CBC Latest Ref Rng & Units 03/21/2021  WBC 4.0 - 10.5 K/uL 5.1  Hemoglobin 12.0 - 15.0 g/dL 9.9(L)  Hematocrit 36.0 - 46.0 % 33.0(L)  Platelets 150 - 400 K/uL 254    LABORATORY DATA:  I have reviewed the data as listed Lab Results  Component Value Date   WBC 5.1 03/21/2021   HGB 9.9 (L) 03/21/2021   HCT 33.0 (L) 03/21/2021   MCV 79.5 (L) 03/21/2021   PLT 254 03/21/2021   Recent Labs    01/24/21 0838 02/21/21 0847 03/21/21 0910  NA 138 140 139  K 4.3 4.3 3.7  CL 107 109 108  CO2 $Re'23 23 23  'JxQ$ GLUCOSE 138* 111* 114*  BUN 40* 28* 25*  CREATININE 1.76* 1.56* 1.39*  CALCIUM 9.1 8.9 8.7*  GFRNONAA 31* 35* 41*  PROT 7.8 7.8 7.9  ALBUMIN 4.1 4.0 4.1  AST $Re'20 22 20  'Myv$ ALT $R'10 11 11  'Ko$ ALKPHOS 101 104 109  BILITOT 0.5 0.6 0.2*    Iron/TIBC/Ferritin/ %Sat    Component Value Date/Time   IRON 62 01/24/2021 0838   IRON 26 (L) 02/04/2019 1309   TIBC 315 01/24/2021 0838   TIBC 251 02/04/2019 1309   FERRITIN 239 01/24/2021 0838   FERRITIN 409 (H) 02/04/2019 1309   IRONPCTSAT 20 01/24/2021 0838   IRONPCTSAT 10 (L) 02/04/2019 1309     RADIOGRAPHIC STUDIES: I have personally reviewed the radiological images as listed and agreed with the findings in the report. No results found.    ASSESSMENT & PLAN:  1. Bladder carcinoma ([place])   2. Metastatic urothelial carcinoma ([place])   3. Encounter for antineoplastic immunotherapy   4. Anemia in stage 3b chronic kidney disease [facility])   Cancer Staging Bladder carcinoma [facility]) Staging form: Urinary Bladder, AJCC 8th Edition - Clinical stage from 08/01/2018: Stage IVA (cTX, cN3, cM1a) - Signed by [name], MD on 04/21/2019  #Metastatic urothelial carcinoma of bladder, sarcomatoid features pelvic sidewall mass biopsy showed metastatic carcinoma consistent with involvement by urothelial carcinoma., Stage IV NED-  Labs reviewed and discussed with patient.  Proceed with Keytruda 200 mg today. Every 4 weeks per patient's preference. Repeat CT chest abdomen pelvis without contrast for monitoring disease status prior to next visit.  #Hernia -parastomal hernia.   She has establish care with [facility] hernia center.  She was recommended to monitor her symptoms and aware ostomy belt  #Chronic kidney disease, creatinine has improved to her baseline.  Encourage oral hydration.  #Chronic anemia secondary to stage III CKD.  Hemoglobin has further decreased to 9.9 today.   Had IV venofer, did not tolerate. Prefers no more IV venofer.  Recommend patient to continue oral iron supplementation.  #Follow-up in 4 weeks for repeat blood work, MD evaluation and Keytruda .  Patient prefers every 4 weeks  treatment  .All questions were answered. The patient knows to call the clinic with any problems questions or concerns.  [name], MD, PhD 03/21/21

## 2021-03-23 ENCOUNTER — Telehealth: Payer: Self-pay

## 2021-03-23 DIAGNOSIS — Z86010 Personal history of colonic polyps: Secondary | ICD-10-CM

## 2021-03-23 NOTE — Telephone Encounter (Signed)
-----   Message from Jonathon Bellows, MD sent at 03/20/2021 10:17 AM EDT ----- Herb Grays - inform > 10 polyps - adenomas taken out, repeat colonoscopy in 1 year,. Suggest to refer for genetic testing due to large number of polyps

## 2021-03-23 NOTE — Telephone Encounter (Signed)
Called patient and had to leave her a detailed message letting her know what Dr. Vicente Males stated below. I will also send her referral to the [place] for genetic testing. I will also put her in our recall list in a year to reach out to her in a year to repeat her colonoscopy.

## 2021-04-07 IMAGING — DX DG KNEE COMPLETE 4+V*L*
4 series · 4 of 4 positions shown · non-contrast
Comparison: 09/02/2012

CLINICAL DATA: Knee pain

EXAM:
LEFT KNEE - COMPLETE 4+ VIEW

[knee obl (1 of 2)]
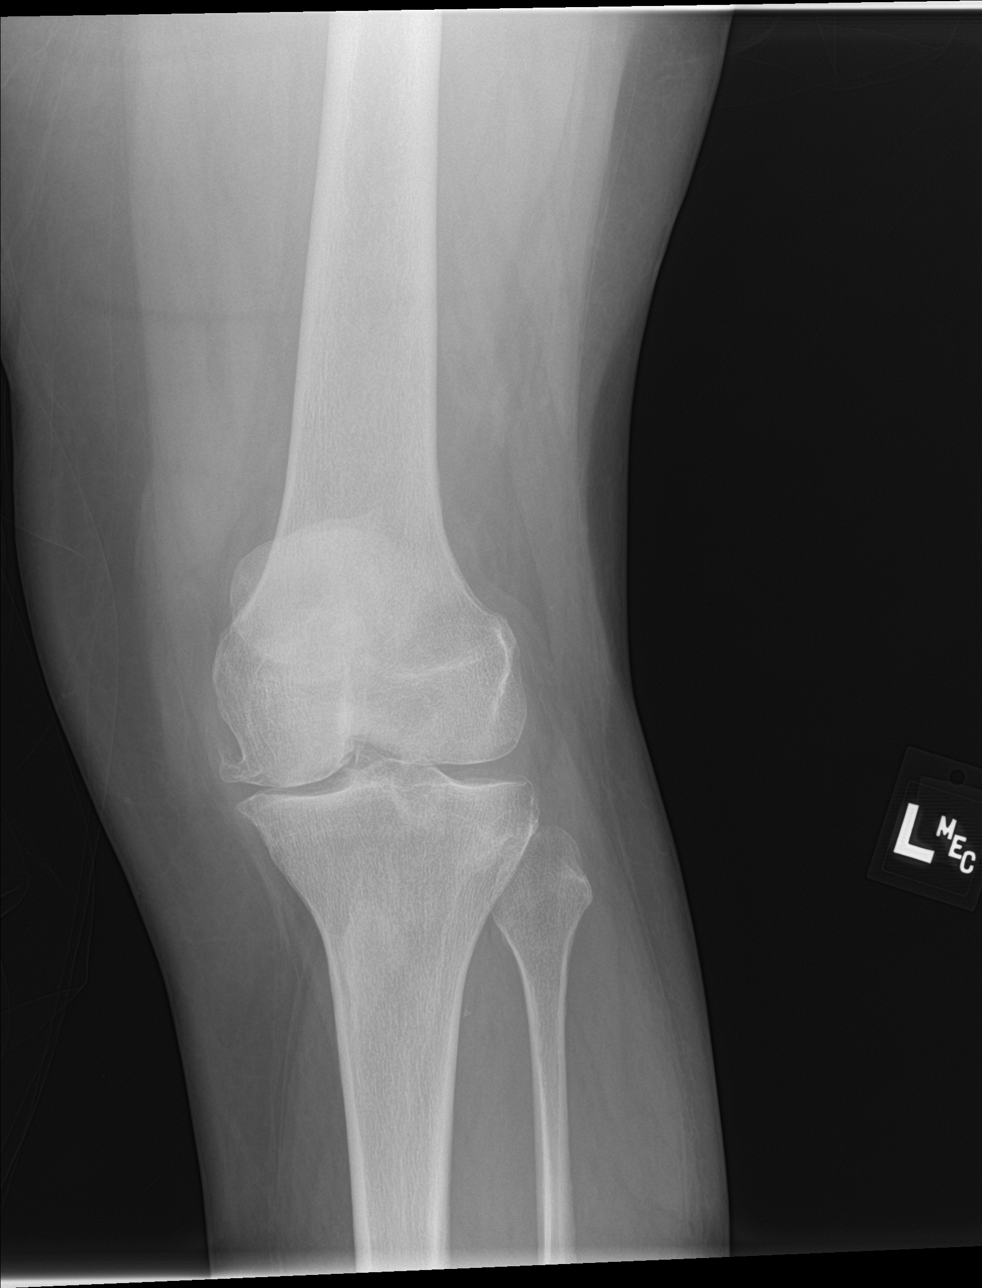

[knee lat]
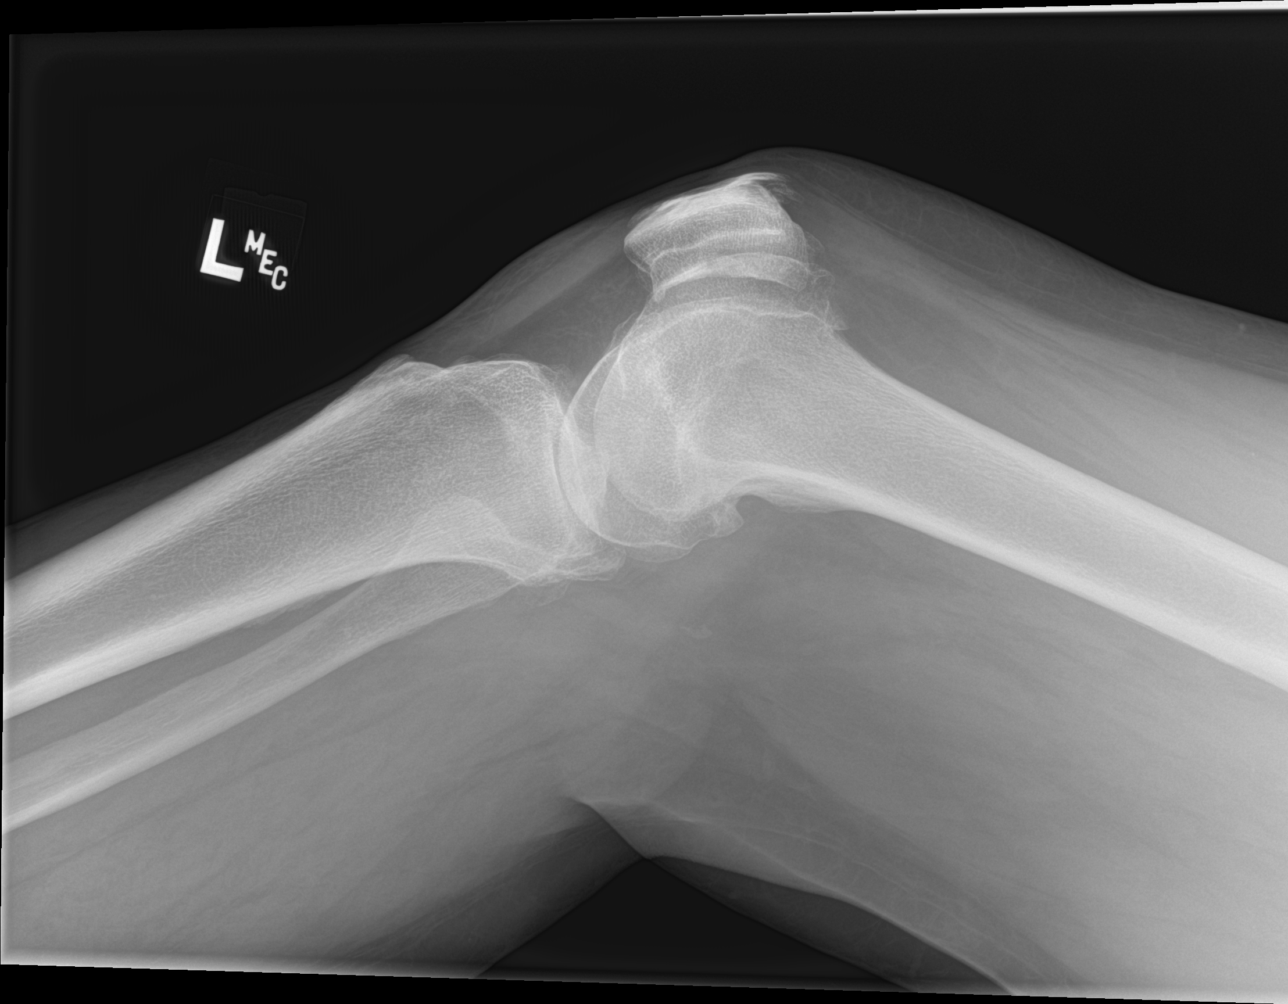

[knee obl (2 of 2)]
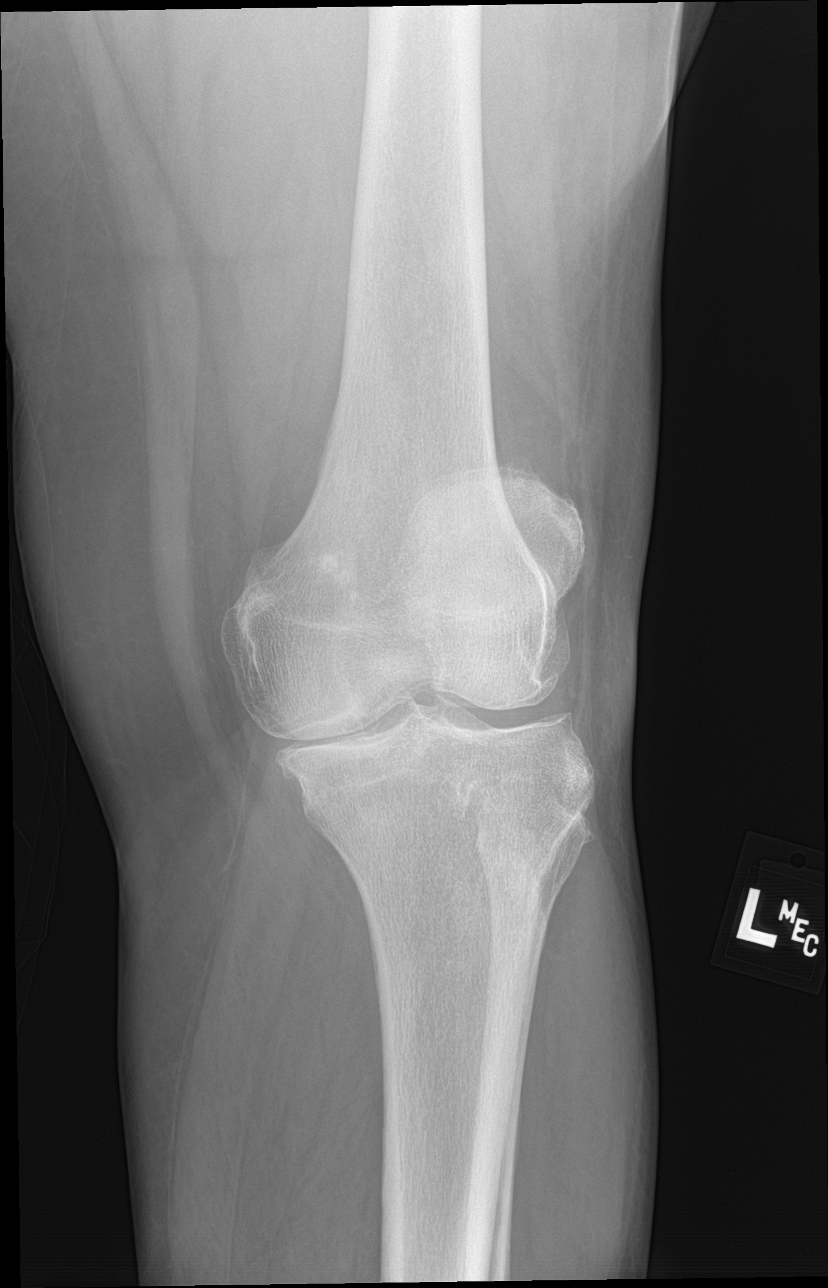

[knee ap]
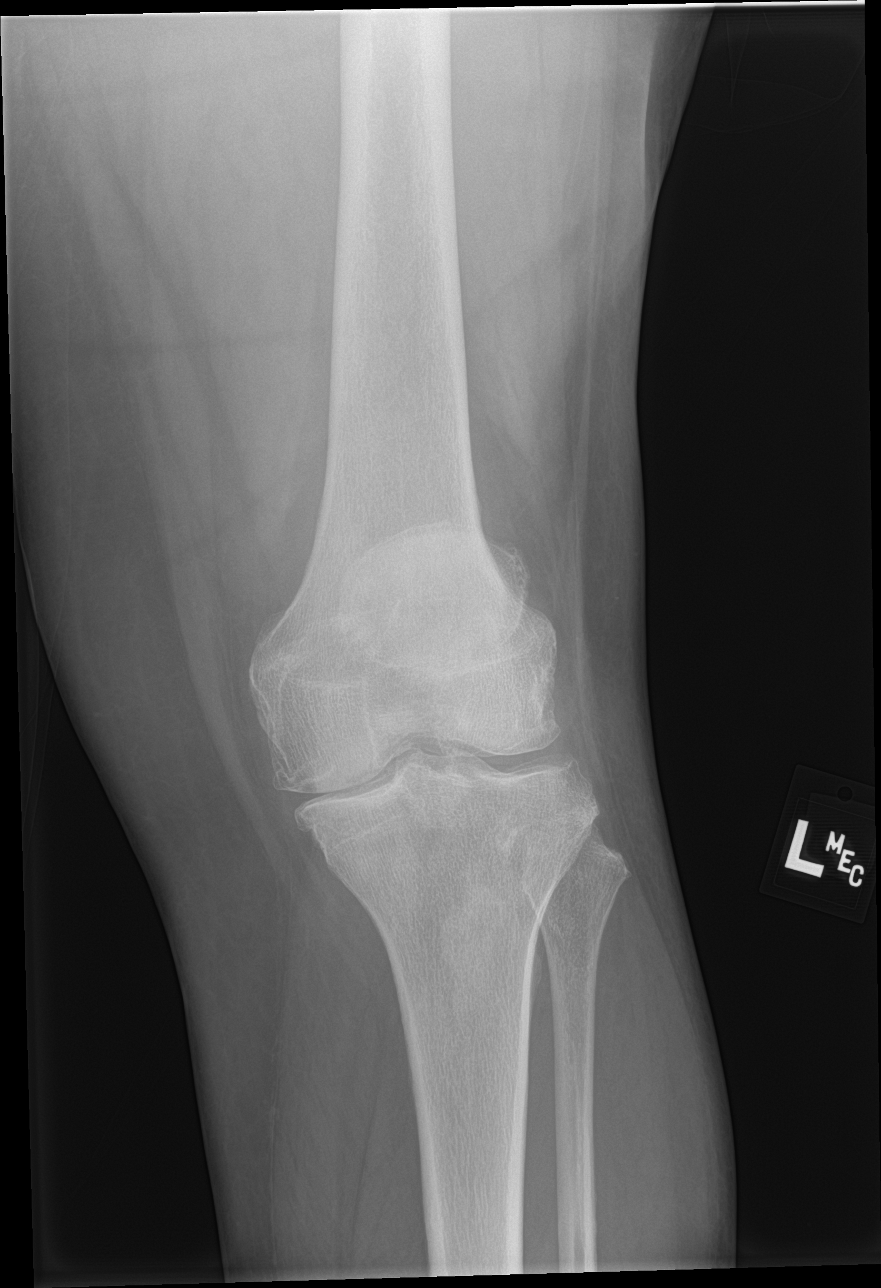

[4 of 4 positions shown; findings below may reference images not displayed]

FINDINGS: Moderate degenerative joint disease changes, most pronounced in the
medial and patellofemoral compartments with joint space narrowing
and spurring. No acute bony abnormality. Specifically, no fracture,
subluxation, or dislocation. No joint effusion.
IMPRESSION: Moderate degenerative changes.  No acute bony abnormality.

## 2021-04-09 ENCOUNTER — Other Ambulatory Visit: Payer: Self-pay | Admitting: Nurse Practitioner

## 2021-04-09 NOTE — Telephone Encounter (Signed)
Requested Prescriptions  Pending Prescriptions Disp Refills  . RYBELSUS 3 MG TABS [Pharmacy Med Name: Rybelsus 3 MG Oral Tablet] 90 tablet     Sig: Take 1 tablet by mouth once daily     Off-Protocol Failed - 04/09/2021 10:18 AM      Failed - Medication not assigned to a protocol, review manually.      Passed - Valid encounter within last 12 months    Recent Outpatient Visits          3 months ago Hypertension associated with diabetes ([place])   [facility] [name], NP   6 months ago Hypertension associated with diabetes ([place])   [facility] [name], NP   6 months ago Controlled type 2 diabetes mellitus with complication, without long-term current use of insulin ([place])   [facility] [name], NP   7 months ago Hypertension associated with diabetes ([place])   [facility] [name], NP   11 months ago Hypertension associated with diabetes [facility])   [place], Jessica A, NP      Future Appointments            In 1 month Jonathon Bellows, MD [place]   In 3 months [name], NP [facility]           . amLODipine ([place]) 5 MG tablet [Pharmacy Med Name: amLODIPine Besylate 5 MG Oral Tablet] 90 tablet     Sig: Take 1 tablet by mouth once daily     Cardiovascular:  Calcium Channel Blockers Failed - 04/09/2021 10:18 AM      Failed - Last BP in normal range    BP Readings from Last 1 Encounters:  03/21/21 (!) 153/83         Passed - Valid encounter within last 6 months    Recent Outpatient Visits          3 months ago Hypertension associated with diabetes ([place])   [facility] [name], NP   6 months ago Hypertension associated with diabetes ([place])   [facility] [name], NP   6 months ago Controlled type 2 diabetes mellitus with complication, without long-term current use of insulin ([place])   [facility] [name], NP   7 months ago Hypertension associated with diabetes ([place])   [facility] [name], NP   11 months ago Hypertension associated with diabetes ([place])   [place], Jessica A, NP      Future Appointments            In 1 month Jonathon Bellows, MD [place]   In 3 months [name], NP [facility]           . losartan (COZAAR) 25 MG tablet [Pharmacy Med Name: Losartan Potassium 25 MG Oral Tablet] 90 tablet     Sig: Take 1 tablet by mouth once daily     Cardiovascular:  Angiotensin Receptor Blockers Failed - 04/09/2021 10:18 AM      Failed - Cr in normal range and within 180 days    Creatinine, Ser  Date Value Ref Range Status  03/21/2021 1.39 (H) 0.44 - 1.00 mg/dL Final         Failed - Last BP in normal range    BP Readings from Last 1 Encounters:  03/21/21 (!) 153/83         Passed - K in  normal range and within 180 days    Potassium  Date Value Ref Range Status  03/21/2021 3.7 3.5 - 5.1 mmol/L Final         Passed - Patient is not pregnant      Passed - Valid encounter within last 6 months    Recent Outpatient Visits          3 months ago Hypertension associated with diabetes ([place])   [facility] [name], NP   6 months ago Hypertension associated with diabetes ([place])   [facility] [name], NP   6 months ago Controlled type 2 diabetes mellitus with complication, without long-term current use of insulin ([place])   [facility] [name], NP   7 months ago Hypertension associated with diabetes [facility])   [facility] [name], NP   11 months ago Hypertension associated with diabetes [facility])   [place] Eulogio Bear, NP      Future Appointments            In 1 month Jonathon Bellows, MD [place]   In 3 months [name], NP [facility] [place]

## 2021-04-09 NOTE — Telephone Encounter (Signed)
Requested medication (s) are due for refill today: no  Requested medication (s) are on the active medication list: yes  Last refill:  02/15/21 #90  Future visit scheduled: yes  Notes to clinic:  med not assigned to a protocol   Requested Prescriptions  Pending Prescriptions Disp Refills   RYBELSUS 3 MG TABS [Pharmacy Med Name: Rybelsus 3 MG Oral Tablet] 90 tablet     Sig: Take 1 tablet by mouth once daily     Off-Protocol Failed - 04/09/2021 10:18 AM      Failed - Medication not assigned to a protocol, review manually.      Passed - Valid encounter within last 12 months    Recent Outpatient Visits           3 months ago Hypertension associated with diabetes ([place])   [facility] [name], NP   6 months ago Hypertension associated with diabetes ([place])   [facility] [name], NP   6 months ago Controlled type 2 diabetes mellitus with complication, without long-term current use of insulin ([place])   [place], Santiago Glad, NP   7 months ago Hypertension associated with diabetes ([place])   [facility] [name], NP   11 months ago Hypertension associated with diabetes [facility])   [facility] Eulogio Bear, NP       Future Appointments             In 1 month Jonathon Bellows, MD [place]   In 3 months [name], NP [employer]            Signed Prescriptions Disp Refills   amLODipine (NORVASC) 5 MG tablet 90 tablet 0    Sig: Take 1 tablet by mouth once daily     Cardiovascular:  Calcium Channel Blockers Failed - 04/09/2021 10:18 AM      Failed - Last BP in normal range    BP Readings from Last 1 Encounters:  03/21/21 (!) 153/83          Passed - Valid encounter within last 6 months    Recent Outpatient Visits           3 months ago Hypertension associated with diabetes ([place])   [facility] [name], NP   6 months  ago Hypertension associated with diabetes ([place])   [facility] [name], NP   6 months ago Controlled type 2 diabetes mellitus with complication, without long-term current use of insulin ([place])   [place], Santiago Glad, NP   7 months ago Hypertension associated with diabetes [facility])   [facility] [name], NP   11 months ago Hypertension associated with diabetes [facility])   [facility] Eulogio Bear, NP       Future Appointments             In 1 month Jonathon Bellows, MD [place]   In 3 months [name], NP [facility]            Refused Prescriptions Disp Refills   losartan (COZAAR) 25 MG tablet [Pharmacy Med Name: Losartan Potassium 25 MG Oral Tablet] 90 tablet     Sig: Take 1 tablet by mouth once daily     Cardiovascular:  Angiotensin Receptor Blockers Failed - 04/09/2021 10:18 AM      Failed - Cr in normal range and within 180 days    Creatinine, Ser  Date Value Ref Range Status  03/21/2021 1.39 (H) 0.44 - 1.00 mg/dL Final          Failed - Last BP in normal range    BP Readings from Last 1 Encounters:  03/21/21 (!) 153/83          Passed - K in normal range and within 180 days    Potassium  Date Value Ref Range Status  03/21/2021 3.7 3.5 - 5.1 mmol/L Final          Passed - Patient is not pregnant      Passed - Valid encounter within last 6 months    Recent Outpatient Visits           3 months ago Hypertension associated with diabetes ([place])   [facility] [name], NP   6 months ago Hypertension associated with diabetes ([place])   [facility] [name], NP   6 months ago Controlled type 2 diabetes mellitus with complication, without long-term current use of insulin ([place])   [facility] [name], NP   7 months ago Hypertension associated with diabetes [facility])   [facility]  [name], NP   11 months ago Hypertension associated with diabetes [facility])   [place] Eulogio Bear, NP       Future Appointments             In 1 month Jonathon Bellows, MD [place]   In 3 months [name], NP [facility] [place]

## 2021-04-10 ENCOUNTER — Other Ambulatory Visit: Payer: Self-pay

## 2021-04-10 ENCOUNTER — Ambulatory Visit
Admission: RE | Admit: 2021-04-10 | Discharge: 2021-04-10 | Disposition: A | Discharge status: Home / Self Care | Payer: Medicare Other | Source: Ambulatory Visit | Attending: Oncology | Admitting: Oncology

## 2021-04-10 DIAGNOSIS — C679 Malignant neoplasm of bladder, unspecified: Secondary | ICD-10-CM | POA: Insufficient documentation

## 2021-04-10 DIAGNOSIS — J929 Pleural plaque without asbestos: Secondary | ICD-10-CM | POA: Diagnosis not present

## 2021-04-10 DIAGNOSIS — I700 Atherosclerosis of aorta: Secondary | ICD-10-CM | POA: Diagnosis not present

## 2021-04-10 DIAGNOSIS — K435 Parastomal hernia without obstruction or  gangrene: Secondary | ICD-10-CM | POA: Diagnosis not present

## 2021-04-10 DIAGNOSIS — J432 Centrilobular emphysema: Secondary | ICD-10-CM | POA: Diagnosis not present

## 2021-04-10 DIAGNOSIS — I2510 Atherosclerotic heart disease of native coronary artery without angina pectoris: Secondary | ICD-10-CM | POA: Diagnosis not present

## 2021-04-18 ENCOUNTER — Encounter: Payer: Self-pay | Admitting: Oncology

## 2021-04-18 ENCOUNTER — Inpatient Hospital Stay: Payer: Medicare Other | Attending: Oncology

## 2021-04-18 ENCOUNTER — Inpatient Hospital Stay: Payer: Medicare Other

## 2021-04-18 ENCOUNTER — Other Ambulatory Visit: Payer: Self-pay

## 2021-04-18 ENCOUNTER — Inpatient Hospital Stay (HOSPITAL_BASED_OUTPATIENT_CLINIC_OR_DEPARTMENT_OTHER): Payer: Medicare Other | Admitting: Oncology

## 2021-04-18 ENCOUNTER — Inpatient Hospital Stay: Payer: Medicare Other | Admitting: Licensed Clinical Social Worker

## 2021-04-18 VITALS — BP 138/69 | HR 79 | Temp 97.4°F | Resp 18 | Wt 212.5 lb

## 2021-04-18 DIAGNOSIS — K435 Parastomal hernia without obstruction or  gangrene: Secondary | ICD-10-CM

## 2021-04-18 DIAGNOSIS — N1832 Chronic kidney disease, stage 3b: Secondary | ICD-10-CM | POA: Insufficient documentation

## 2021-04-18 DIAGNOSIS — Z86718 Personal history of other venous thrombosis and embolism: Secondary | ICD-10-CM | POA: Insufficient documentation

## 2021-04-18 DIAGNOSIS — Z5112 Encounter for antineoplastic immunotherapy: Secondary | ICD-10-CM | POA: Insufficient documentation

## 2021-04-18 DIAGNOSIS — I700 Atherosclerosis of aorta: Secondary | ICD-10-CM | POA: Insufficient documentation

## 2021-04-18 DIAGNOSIS — E119 Type 2 diabetes mellitus without complications: Secondary | ICD-10-CM | POA: Diagnosis not present

## 2021-04-18 DIAGNOSIS — R635 Abnormal weight gain: Secondary | ICD-10-CM

## 2021-04-18 DIAGNOSIS — Z87891 Personal history of nicotine dependence: Secondary | ICD-10-CM | POA: Diagnosis not present

## 2021-04-18 DIAGNOSIS — I129 Hypertensive chronic kidney disease with stage 1 through stage 4 chronic kidney disease, or unspecified chronic kidney disease: Secondary | ICD-10-CM | POA: Insufficient documentation

## 2021-04-18 DIAGNOSIS — E785 Hyperlipidemia, unspecified: Secondary | ICD-10-CM | POA: Insufficient documentation

## 2021-04-18 DIAGNOSIS — Z79899 Other long term (current) drug therapy: Secondary | ICD-10-CM | POA: Diagnosis not present

## 2021-04-18 DIAGNOSIS — C7910 Secondary malignant neoplasm of unspecified urinary organs: Secondary | ICD-10-CM

## 2021-04-18 DIAGNOSIS — I2510 Atherosclerotic heart disease of native coronary artery without angina pectoris: Secondary | ICD-10-CM | POA: Insufficient documentation

## 2021-04-18 DIAGNOSIS — D631 Anemia in chronic kidney disease: Secondary | ICD-10-CM

## 2021-04-18 DIAGNOSIS — D508 Other iron deficiency anemias: Secondary | ICD-10-CM | POA: Insufficient documentation

## 2021-04-18 DIAGNOSIS — C679 Malignant neoplasm of bladder, unspecified: Secondary | ICD-10-CM | POA: Diagnosis not present

## 2021-04-18 LAB — COMPREHENSIVE METABOLIC PANEL
ALT: 12 U/L (ref 0–44)
AST: 22 U/L (ref 15–41)
Albumin: 4 g/dL (ref 3.5–5.0)
Alkaline Phosphatase: 126 U/L (ref 38–126)
Anion gap: 11 (ref 5–15)
BUN: 33 mg/dL — ABNORMAL HIGH (ref 8–23)
CO2: 21 mmol/L — ABNORMAL LOW (ref 22–32)
Calcium: 9.2 mg/dL (ref 8.9–10.3)
Chloride: 106 mmol/L (ref 98–111)
Creatinine, Ser: 1.72 mg/dL — ABNORMAL HIGH (ref 0.44–1.00)
GFR, Estimated: 31 mL/min — ABNORMAL LOW (ref >60)
Glucose, Bld: 155 mg/dL — ABNORMAL HIGH (ref 70–99)
Potassium: 4.3 mmol/L (ref 3.5–5.1)
Sodium: 138 mmol/L (ref 135–145)
Total Bilirubin: 0.5 mg/dL (ref 0.3–1.2)
Total Protein: 8 g/dL (ref 6.5–8.1)

## 2021-04-18 LAB — CBC WITH DIFFERENTIAL/PLATELET
Abs Immature Granulocytes: 0.02 10*3/uL (ref 0.00–0.07)
Basophils Absolute: 0 10*3/uL (ref 0.0–0.1)
Basophils Relative: 1 %
Eosinophils Absolute: 0.2 10*3/uL (ref 0.0–0.5)
Eosinophils Relative: 3 %
HCT: 33.9 % — ABNORMAL LOW (ref 36.0–46.0)
Hemoglobin: 10.4 g/dL — ABNORMAL LOW (ref 12.0–15.0)
Immature Granulocytes: 0 %
Lymphocytes Relative: 24 %
Lymphs Abs: 1.5 10*3/uL (ref 0.7–4.0)
MCH: 24.3 pg — ABNORMAL LOW (ref 26.0–34.0)
MCHC: 30.7 g/dL (ref 30.0–36.0)
MCV: 79.2 fL — ABNORMAL LOW (ref 80.0–100.0)
Monocytes Absolute: 0.4 10*3/uL (ref 0.1–1.0)
Monocytes Relative: 7 %
Neutro Abs: 4 10*3/uL (ref 1.7–7.7)
Neutrophils Relative %: 65 %
Platelets: 237 10*3/uL (ref 150–400)
RBC: 4.28 MIL/uL (ref 3.87–5.11)
RDW: 14.7 % (ref 11.5–15.5)
WBC: 6.1 10*3/uL (ref 4.0–10.5)
nRBC: 0 % (ref 0.0–0.2)

## 2021-04-18 LAB — TSH: TSH: 2.147 u[IU]/mL (ref 0.350–4.500)

## 2021-04-18 MED ORDER — HEPARIN SOD (PORK) LOCK FLUSH 100 UNIT/ML IV SOLN
500.0000 [IU] | Freq: Once | INTRAVENOUS | Status: DC
  Filled 2021-04-18: qty 5

## 2021-04-18 MED ORDER — SODIUM CHLORIDE 0.9 % IV SOLN
200.0000 mg | Freq: Once | INTRAVENOUS | Status: AC
  Administered 2021-04-18: 200 mg via INTRAVENOUS
  Filled 2021-04-18: qty 8

## 2021-04-18 MED ORDER — HEPARIN SOD (PORK) LOCK FLUSH 100 UNIT/ML IV SOLN
INTRAVENOUS | Status: AC
  Administered 2021-04-18: 500 [IU]
  Filled 2021-04-18: qty 5

## 2021-04-18 MED ORDER — SODIUM CHLORIDE 0.9 % IV SOLN
Freq: Once | INTRAVENOUS | Status: AC
  Filled 2021-04-18: qty 250

## 2021-04-18 MED ORDER — HEPARIN SOD (PORK) LOCK FLUSH 100 UNIT/ML IV SOLN
500.0000 [IU] | Freq: Once | INTRAVENOUS | Status: AC | PRN
  Filled 2021-04-18: qty 5

## 2021-04-18 MED ORDER — SODIUM CHLORIDE 0.9% FLUSH
10.0000 mL | INTRAVENOUS | Status: DC | PRN
  Filled 2021-04-18: qty 10

## 2021-04-18 MED ORDER — SODIUM CHLORIDE 0.9% FLUSH
10.0000 mL | INTRAVENOUS | Status: DC | PRN
  Administered 2021-04-18: 10 mL via INTRAVENOUS
  Filled 2021-04-18: qty 10

## 2021-04-18 NOTE — Progress Notes (Signed)
Pt here for follow up. No new concerns voiced.   

## 2021-04-18 NOTE — Patient Instructions (Signed)
[facility]ENTER [place] [facility]  Discharge Instructions: Thank you for choosing [facility] to provide your oncology and hematology care.  If you have a lab appointment with the Cancer Center, please go directly to the Cancer Center and check in at the registration area.  Wear comfortable clothing and clothing appropriate for easy access to any Portacath or PICC line.   We strive to give you quality time with your provider. You may need to reschedule your appointment if you arrive late (15 or more minutes).  Arriving late affects you and other patients whose appointments are after yours.  Also, if you miss three or more appointments without notifying the office, you may be dismissed from the clinic at the provider's discretion.      For prescription refill requests, have your pharmacy contact our office and allow 72 hours for refills to be completed.    Today you received the following chemotherapy and/or immunotherapy agents - pembrolizumab      To help prevent nausea and vomiting after your treatment, we encourage you to take your nausea medication as directed.  BELOW ARE SYMPTOMS THAT SHOULD BE REPORTED IMMEDIATELY: *FEVER GREATER THAN 100.4 F (38 C) OR HIGHER *CHILLS OR SWEATING *NAUSEA AND VOMITING THAT IS NOT CONTROLLED WITH YOUR NAUSEA MEDICATION *UNUSUAL SHORTNESS OF BREATH *UNUSUAL BRUISING OR BLEEDING *URINARY PROBLEMS (pain or burning when urinating, or frequent urination) *BOWEL PROBLEMS (unusual diarrhea, constipation, pain near the anus) TENDERNESS IN MOUTH AND THROAT WITH OR WITHOUT PRESENCE OF ULCERS (sore throat, sores in mouth, or a toothache) UNUSUAL RASH, SWELLING OR PAIN  UNUSUAL VAGINAL DISCHARGE OR ITCHING   Items with * indicate a potential emergency and should be followed up as soon as possible or go to the Emergency Department if any problems should occur.  Please show the CHEMOTHERAPY ALERT CARD or IMMUNOTHERAPY ALERT CARD at  check-in to the Emergency Department and triage nurse.  Should you have questions after your visit or need to cancel or reschedule your appointment, please contact CANCER CENTER  [facility]  [phone] and follow the prompts.  Office hours are 8:00 a.m. to 4:30 p.m. Monday - Friday. Please note that voicemails left after 4:00 p.m. may not be returned until the following business day.  We are closed weekends and major holidays. You have access to a nurse at all times for urgent questions. Please call the main number to the clinic [phone] and follow the prompts.  For any non-urgent questions, you may also contact your provider using MyChart. We now offer e-Visits for anyone 18 and older to request care online for non-urgent symptoms. For details visit [email or web address].   Also download the MyChart app! Go to the app store, search "MyChart", open the app, select [facility], and log in with your MyChart username and password.  Due to Covid, a mask is required upon entering the hospital/clinic. If you do not have a mask, one will be given to you upon arrival. For doctor visits, patients may have 1 support person aged 18 or older with them. For treatment visits, patients cannot have anyone with them due to current Covid guidelines and our immunocompromised population.   Pembrolizumab injection What is this medication? PEMBROLIZUMAB (pem broe liz ue mab) is a monoclonal antibody. It is used to treat certain types of cancer. This medicine may be used for other purposes; ask your health care provider or pharmacist if you have questions. COMMON BRAND NAME(S): Keytruda What should I tell my care   team before I take this medication? They need to know if you have any of these conditions: autoimmune diseases like Crohn's disease, ulcerative colitis, or lupus have had or planning to have an allogeneic stem cell transplant (uses someone else's stem cells) history of organ  transplant history of chest radiation nervous system problems like myasthenia gravis or Guillain-Barre syndrome an unusual or allergic reaction to pembrolizumab, other medicines, foods, dyes, or preservatives pregnant or trying to get pregnant breast-feeding How should I use this medication? This medicine is for infusion into a vein. It is given by a health care professional in a hospital or clinic setting. A special MedGuide will be given to you before each treatment. Be sure to read this information carefully each time. Talk to your pediatrician regarding the use of this medicine in children. While this drug may be prescribed for children as young as 6 months for selected conditions, precautions do apply. Overdosage: If you think you have taken too much of this medicine contact a poison control center or emergency room at once. NOTE: This medicine is only for you. Do not share this medicine with others. What if I miss a dose? It is important not to miss your dose. Call your doctor or health care professional if you are unable to keep an appointment. What may interact with this medication? Interactions have not been studied. This list may not describe all possible interactions. Give your health care provider a list of all the medicines, herbs, non-prescription drugs, or dietary supplements you use. Also tell them if you smoke, drink alcohol, or use illegal drugs. Some items may interact with your medicine. What should I watch for while using this medication? Your condition will be monitored carefully while you are receiving this medicine. You may need blood work done while you are taking this medicine. Do not become pregnant while taking this medicine or for 4 months after stopping it. Women should inform their doctor if they wish to become pregnant or think they might be pregnant. There is a potential for serious side effects to an unborn child. Talk to your health care professional or  pharmacist for more information. Do not breast-feed an infant while taking this medicine or for 4 months after the last dose. What side effects may I notice from receiving this medication? Side effects that you should report to your doctor or health care professional as soon as possible: allergic reactions like skin rash, itching or hives, swelling of the face, lips, or tongue bloody or black, tarry breathing problems changes in vision chest pain chills confusion constipation cough diarrhea dizziness or feeling faint or lightheaded fast or irregular heartbeat fever flushing joint pain low blood counts - this medicine may decrease the number of white blood cells, red blood cells and platelets. You may be at increased risk for infections and bleeding. muscle pain muscle weakness pain, tingling, numbness in the hands or feet persistent headache redness, blistering, peeling or loosening of the skin, including inside the mouth signs and symptoms of high blood sugar such as dizziness; dry mouth; dry skin; fruity breath; nausea; stomach pain; increased hunger or thirst; increased urination signs and symptoms of kidney injury like trouble passing urine or change in the amount of urine signs and symptoms of liver injury like dark urine, light-colored stools, loss of appetite, nausea, right upper belly pain, yellowing of the eyes or skin sweating swollen lymph nodes weight loss Side effects that usually do not require medical attention (report to your doctor   or health care professional if they continue or are bothersome): decreased appetite hair loss tiredness This list may not describe all possible side effects. Call your doctor for medical advice about side effects. You may report side effects to FDA at 1-800-FDA-1088. Where should I keep my medication? This drug is given in a hospital or clinic and will not be stored at home. NOTE: This sheet is a summary. It may not cover all possible  information. If you have questions about this medicine, talk to your doctor, pharmacist, or health care provider.  2022 [employer] (2021-02-07 00:00:00)  

## 2021-04-18 NOTE — Progress Notes (Signed)
Hematology/Oncology follow up note [phone] [phone]   Patient Care Team: [name], NP as PCP - General Nice, [name], OD (Optometry) [name], MD as Consulting Physician (Hematology and Oncology) [name], [facility] (Pharmacist)   CHIEF COMPLAINTS/REASON FOR VISIT:  Follow up for bladder cancer, anemia.   HISTORY OF PRESENTING ILLNESS:  Angela Russell is a  70 y.o.  female with PMH listed below who was referred to me for evaluation of newly diagnosed bladder cancer. Patient initially presented to emergency room at the end of January 2020 for evaluation of dysuria, hematuria and the left lower quadrant inguinal pain and flank pain.  1/28 2020 CT renal stone study showed suspected irregular wall thickening about the superior bladder, not well assessed due to degree of bladder distention.  Recommend cystoscopy for further evaluation.  No renal stone or obstructive uropathy.  Patient was given IV Rocephin and referred patient for outpatient urology follow-up.  Urine culture was negative.  She again presented to ER after 2 days with similar symptoms.  Patient has 25-pack-year smoking history, quit approximately 20 years ago.  No family history of any urology malignancies 07/03/2018 another CT abdomen pelvis with contrast was done which showed no nephrolithiasis or hydronephrosis is identified.  Bladder is decompressed limiting evaluation.  07/16/2018.urology Dr. [name] - cystoscopy and bilateral retrograde pyelogram on 07/16/2018.  Pyelogram did not show any filling defect or abnormalities.  No hydronephrosis.  Ureteral orifice was not involved with tumor.  There is a large 5 cm posterior wall bladder tumor, bullous and sessile appearing.  Patient underwent TURBT.   Pathology: High-grade urothelial carcinoma, invasive into muscularis propria.  Lymphovascular invasion is present.  Carcinoma in situ is also identified.  Focal squamous differentiation is noted,  areas of invasive carcinoma display pleomorphic/sarcomatoid changes.  T2b  08/07/2018 CT without contrast negative.  2 subpleural right upper lobe nodule 2 to 3 mm likely benign. She also had baseline audiometry done.  08/04/2018 ddMVAC x 1 cycle, stopped due to intolerance and AKI.  Patient received 1 cycle of dd MVAC, not able to tolerate due to AKI. Patient then was referred to [facility] urology  09/22/2018 patient underwent a cystectomy, pathology pT3a N0 Mx.  11 lymph nodes were harvested and was all negative. Invasive urothelia carcinoma, high grade, with sarcomatoid features.   10/14/2018 patient was admitted due to pyelonephritis and a pelvic fluid collection.  Drain was placed. 10/23/2018 drain was removed. Patient has had difficulties getting to her appointments to [facility]. 02/09/2019, patient presented with abdominal pain. CT concerning for small bowel obstruction and increased size of right pelvic fluid collection concerning for cancer recurrence.  Right hydronephrosis to the level of pelvis and enlarged lymph nodes. Patient had JP drain placed with CT guidance to pelvic fluid collection and drained 400 cc amber fluid.  Culture was negative for growth of microorganisms and cytology was negative for malignancy.-JP drain was removed on the day of discharge. CT-guided core biopsy of pelvic lymph node adenopathy was attempted but not successful.  # Right lower extremity DVT, provoked by Transvenous biopsy  02/26/2019 transvenous biopsy by IR unsuccessful transvenous biopsy of the right pelvis mass. Post biopsy acute thrombus in the right external iliac and common femoral vein.  Patient was recommended to start anticoagulation with Xarelto however due to the co-pay, patient is not able to afford the medication. Anticoagulation regimen was switched to Eliquis 5 mg.  Patient reports that she has been taking it once a day.  03/20/2019 PET showed FDG avid tissue in the cystectomy bed,  retroperitoneal and pelvic lymphadenopathy.  Right common iliac DVT.  # increased right lower extremity swelling.  She was started on Eliquis for anticoagulation by [facility].  We clarified with her pharmacy and she was actually taking Eliquis 2.5 mg twice daily. Right lower extremity swelling has not improved but instead worsened. 03/16/2019 She had ultrasound right lower extremity done which showed persistent extensive proximal right lower extremity DVT.  Anticoagulation regimen has increased to Eliquis 5 mg twice daily.  # establish care with [facility] oncology Dr. [name] for evaluation.  Dr. [name] recommended starting immunotherapy with PD-L1 inhibitor Pembrolizumab 200 mg every 3 weeks.  The sarcomatoid histology may not respond to chemotherapy well, could portend a better chance of response into immunotherapy PET scan after 4 cycles of Keytruda showed single mildly enlarged central mesenteric lymph node in the upper pelvis with SUV 9.6.  No other abnormal hypermetabolic activity was reported.  # #Right lower extremity  femoral vein DVT provoked by transvenous biopsy in the context of cancer recurrence, Status post IV C filter and mechanical thrombectomy.  IVC filter has been removed. She is currently off anticoagulation after she developed hematuria.  # 03/30/2019 Status post IVC filter placement, mechanical thrombectomy.-IVC filter was retrieved in January 2021. # PD-L1 CPS 100%.  # 03/31/2019 started on immunotherapy Keytruda.  # 04/26/2020 CT chest abdomen pelvis was reviewed and discussed with patient. No definitive finding of disease recurrence or metastasis.  Small amount of ascites similar to prior. There is a parastomal hernia containing a small margin of adjacent small bowel.  This is associated with wall thickening in approximately 7 cm segment of bowel adjacent to the hernia.  There is adjacent abnormal stranding of the mesentery and omentum.  # 08/19/2020, CT chest abdomen pelvis  showed no evidence of new/progressive metastatic disease. NED Hold off treatment due to uncontrolled hypotension.  # Chronic abdominal discomfort due to hernia. 08/11/2020, patient was seen by Dr. Dolphus Jenny for parastomal hernia.  Patient has no obstructive symptoms and would like to continue to monitor her symptoms and hold off any intervention at this point.  Patient was recommended to wear ostomy hernia belt and was referred to [facility] wound ostomy nursing team.  She received IV venofer previously and did not tolerate due to energized joint pain afterwards.   INTERVAL HISTORY MALEY VENEZIA is a 70 y.o. female who has above history reviewed by me today presents for follow up visit for evaluation form metastatic high-grade urothelial carcinoma of the bladder. Sarcomatoid feature.  Patient has been on maintenance immunotherapy.   She is currently on Keytruda every 4 weeks per patient's request. Patient reports feeling well.  No new complaints.  She takes oral iron supplementation.  She has not had a colonoscopy done during interval.  Review of Systems  Constitutional:  Negative for appetite change, chills, fatigue and fever.  HENT:   Negative for hearing loss and voice change.   Eyes:  Negative for eye problems.  Respiratory:  Negative for chest tightness and cough.   Cardiovascular:  Negative for chest pain and leg swelling.  Gastrointestinal:  Positive for constipation. Negative for abdominal distention, abdominal pain and blood in stool.  Endocrine: Negative for hot flashes.  Genitourinary:  Negative for difficulty urinating and frequency.   Musculoskeletal:  Positive for arthralgias.  Skin:  Negative for itching and rash.  Neurological:  Positive for numbness. Negative for extremity weakness.  Hematological:  Negative for  adenopathy.  Psychiatric/Behavioral:  Negative for confusion. The patient is not nervous/anxious.    MEDICAL HISTORY:  Past Medical History:  Diagnosis Date   Carotid  artery plaque, right 01/2014   CKD (chronic kidney disease)    stage 2-3   Controlled diabetes mellitus type 2 with complications (HCC)    Diabetes mellitus without complication (HCC)    DM (diabetes mellitus), type 2, uncontrolled    Hyperlipidemia    Hypertension    Hypochromic microcytic anemia    mild   Metastatic urothelial carcinoma ([place]) 03/23/2019   Osteoporosis    Renal insufficiency    Rotator cuff tendonitis, right     SURGICAL HISTORY: Past Surgical History:  Procedure Laterality Date   ABDOMINAL HYSTERECTOMY  2000   due to bleeding and fibroids, partial- still has ovaries   COLONOSCOPY WITH PROPOFOL N/A 03/17/2021   Procedure: COLONOSCOPY WITH PROPOFOL;  Surgeon: Jonathon Bellows, MD;  Location: [facility] ENDOSCOPY;  Service: Gastroenterology;  Laterality: N/A;   CYSTOSCOPY W/ RETROGRADES Bilateral 07/16/2018   Procedure: CYSTOSCOPY WITH RETROGRADE PYELOGRAM;  Surgeon: Billey Co, MD;  Location: [facility] ORS;  Service: Urology;  Laterality: Bilateral;   ESOPHAGOGASTRODUODENOSCOPY  03/17/2021   Procedure: ESOPHAGOGASTRODUODENOSCOPY (EGD);  Surgeon: Jonathon Bellows, MD;  Location: [facility] ENDOSCOPY;  Service: Gastroenterology;;   IVC FILTER INSERTION N/A 03/30/2019   Procedure: IVC FILTER INSERTION;  Surgeon: Algernon Huxley, MD;  Location: [place] CV LAB;  Service: Cardiovascular;  Laterality: N/A;   IVC FILTER REMOVAL N/A 06/15/2019   Procedure: IVC FILTER REMOVAL;  Surgeon: Algernon Huxley, MD;  Location: [place] CV LAB;  Service: Cardiovascular;  Laterality: N/A;   KNEE SURGERY Left 03/17/2013   torn meniscus   PERIPHERAL VASCULAR THROMBECTOMY Right 03/30/2019   Procedure: PERIPHERAL VASCULAR THROMBECTOMY;  Surgeon: Algernon Huxley, MD;  Location: [place] CV LAB;  Service: Cardiovascular;  Laterality: Right;   PORTA CATH INSERTION N/A 08/06/2018   Procedure: PORTA CATH INSERTION;  Surgeon: Algernon Huxley, MD;  Location: [place] CV LAB;  Service: Cardiovascular;   Laterality: N/A;   TRANSURETHRAL RESECTION OF BLADDER TUMOR N/A 07/16/2018   Procedure: TRANSURETHRAL RESECTION OF BLADDER TUMOR (TURBT);  Surgeon: Billey Co, MD;  Location: [facility] ORS;  Service: Urology;  Laterality: N/A;    SOCIAL HISTORY: Social History   Socioeconomic History   Marital status: Single    Spouse name: Not on file   Number of children: 1   Years of education: Not on file   Highest education level: 10th grade  Occupational History   Not on file  Tobacco Use   Smoking status: Former    Types: Cigarettes    Quit date: 06/05/1991    Years since quitting: 29.8   Smokeless tobacco: Never  Vaping Use   Vaping Use: Never used  Substance and Sexual Activity   Alcohol use: No   Drug use: No   Sexual activity: Not Currently  Other Topics Concern   Not on file  Social History Narrative   Working full time   Social Determinants of Radio broadcast assistant Strain: Low Risk    Difficulty of Paying Living Expenses: Not hard at all  Food Insecurity: Not on file  Transportation Needs: Not on file  Physical Activity: Not on file  Stress: Not on file  Social Connections: Not on file  Intimate Partner Violence: Not on file    FAMILY HISTORY: Family History  Problem Relation Age of Onset   Heart disease Mother  Heart attack Mother    Arthritis Father    Diabetes Brother     ALLERGIES:  has No Known Allergies.  MEDICATIONS:  Current Outpatient Medications  Medication Sig Dispense Refill   amLODipine (NORVASC) 5 MG tablet Take 1 tablet by mouth once daily 90 tablet 0   diclofenac sodium (VOLTAREN) 1 % GEL Apply 2 g topically 4 (four) times daily. (Patient taking differently: Apply 2 g topically as needed.) 100 g 2   docusate sodium (COLACE) 100 MG capsule Take 1 capsule (100 mg total) by mouth daily. 90 capsule 0   lidocaine-prilocaine (EMLA) cream Apply 1 application topically as needed. 30 g 3   losartan (COZAAR) 25 MG tablet Take 1 tablet by mouth  once daily 90 tablet 0   pembrolizumab (KEYTRUDA) 100 MG/4ML SOLN Inject 200 mg into the vein. Every 42 days,    Last txy 4/05, Next 5/03     polyethylene glycol (MIRALAX / GLYCOLAX) 17 g packet Take 17 g by mouth daily as needed for mild constipation. 14 each 0   rosuvastatin (CRESTOR) 40 MG tablet Take 1 tablet (40 mg total) by mouth daily. 90 tablet 1   RYBELSUS 3 MG TABS Take 1 tablet by mouth once daily 90 tablet 1   No current facility-administered medications for this visit.   Facility-Administered Medications Ordered in Other Visits  Medication Dose Route Frequency Provider Last Rate Last Admin   heparin lock flush 100 UNIT/ML injection              PHYSICAL EXAMINATION: ECOG PERFORMANCE STATUS: 1 - Symptomatic but completely ambulatory Vitals:   04/18/21 0907  BP: 138/69  Pulse: 79  Resp: 18  Temp: (!) 97.4 F (36.3 C)   Filed Weights   04/18/21 0907  Weight: 212 lb 8 oz (96.4 kg)    Physical Exam Constitutional:      General: She is not in acute distress.    Comments: Walk independently  HENT:     Head: Normocephalic and atraumatic.  Eyes:     General: No scleral icterus.    Pupils: Pupils are equal, round, and reactive to light.  Cardiovascular:     Rate and Rhythm: Normal rate and regular rhythm.     Heart sounds: Normal heart sounds.  Pulmonary:     Effort: Pulmonary effort is normal. No respiratory distress.     Breath sounds: No wheezing.  Abdominal:     General: Bowel sounds are normal. There is no distension.     Palpations: Abdomen is soft. There is no mass.     Comments: Ureterostomy,   Musculoskeletal:        General: No deformity. Normal range of motion.     Cervical back: Normal range of motion and neck supple.  Skin:    General: Skin is warm and dry.     Coloration: Skin is not pale.     Findings: No erythema or rash.  Neurological:     Mental Status: She is alert and oriented to person, place, and time. Mental status is at baseline.      Cranial Nerves: No cranial nerve deficit.     Coordination: Coordination normal.  Psychiatric:        Mood and Affect: Mood normal.  .  RADIOGRAPHIC STUDIES: I have personally reviewed the radiological images as listed and agreed with the findings in the report.  CMP Latest Ref Rng & Units 04/18/2021  Glucose 70 - 99 mg/dL 155(H)  BUN 8 -  23 mg/dL 33(H)  Creatinine 0.44 - 1.00 mg/dL 1.72(H)  Sodium 135 - 145 mmol/L 138  Potassium 3.5 - 5.1 mmol/L 4.3  Chloride 98 - 111 mmol/L 106  CO2 22 - 32 mmol/L 21(L)  Calcium 8.9 - 10.3 mg/dL 9.2  Total Protein 6.5 - 8.1 g/dL 8.0  Total Bilirubin 0.3 - 1.2 mg/dL 0.5  Alkaline Phos 38 - 126 U/L 126  AST 15 - 41 U/L 22  ALT 0 - 44 U/L 12   CBC Latest Ref Rng & Units 04/18/2021  WBC 4.0 - 10.5 K/uL 6.1  Hemoglobin 12.0 - 15.0 g/dL 10.4(L)  Hematocrit 36.0 - 46.0 % 33.9(L)  Platelets 150 - 400 K/uL 237    LABORATORY DATA:  I have reviewed the data as listed Lab Results  Component Value Date   WBC 6.1 04/18/2021   HGB 10.4 (L) 04/18/2021   HCT 33.9 (L) 04/18/2021   MCV 79.2 (L) 04/18/2021   PLT 237 04/18/2021   Recent Labs    02/21/21 0847 03/21/21 0910 04/18/21 0840  NA 140 139 138  K 4.3 3.7 4.3  CL 109 108 106  CO2 23 23 21*  GLUCOSE 111* 114* 155*  BUN 28* 25* 33*  CREATININE 1.56* 1.39* 1.72*  CALCIUM 8.9 8.7* 9.2  GFRNONAA 35* 41* 31*  PROT 7.8 7.9 8.0  ALBUMIN 4.0 4.1 4.0  AST 22 20 22   ALT 11 11 12   ALKPHOS 104 109 126  BILITOT 0.6 0.2* 0.5    Iron/TIBC/Ferritin/ %Sat    Component Value Date/Time   IRON 62 01/24/2021 0838   IRON 26 (L) 02/04/2019 1309   TIBC 315 01/24/2021 0838   TIBC 251 02/04/2019 1309   FERRITIN 239 01/24/2021 0838   FERRITIN 409 (H) 02/04/2019 1309   IRONPCTSAT 20 01/24/2021 0838   IRONPCTSAT 10 (L) 02/04/2019 1309    RADIOGRAPHIC STUDIES: I have personally reviewed the radiological images as listed and agreed with the findings in the report. CT CHEST ABDOMEN PELVIS WO  CONTRAST  Result Date: 04/10/2021 CLINICAL DATA:  Urothelial cancer, metastatic bladder cancer restaging, status post cystectomy and ileal conduit urinary diversion. EXAM: CT CHEST, ABDOMEN AND PELVIS WITHOUT CONTRAST TECHNIQUE: Multidetector CT imaging of the chest, abdomen and pelvis was performed following the standard protocol without IV contrast. Oral enteric processes menstrual. COMPARISON:  12/13/2020 FINDINGS: CT CHEST FINDINGS Cardiovascular: Right chest port catheter. Aortic atherosclerosis. Normal heart size. Three-vessel coronary artery calcifications. No pericardial effusion. Mediastinum/Nodes: No enlarged mediastinal, hilar, or axillary lymph nodes. Thyroid gland, trachea, and esophagus demonstrate no significant findings. Lungs/Pleura: Frothy debris in the right mainstem bronchus. Mild centrilobular emphysema. Diffuse bilateral bronchial wall thickening. No pleural effusion or pneumothorax. Musculoskeletal: No chest wall mass or suspicious bone lesions identified. CT ABDOMEN PELVIS FINDINGS Hepatobiliary: No solid liver abnormality is seen. No gallstones, gallbladder wall thickening, or biliary dilatation. Pancreas: Unremarkable. No pancreatic ductal dilatation or surrounding inflammatory changes. Spleen: Normal in size without significant abnormality. Adrenals/Urinary Tract: Adrenal glands are unremarkable. Kidneys are normal, without renal calculi, solid lesion, or hydronephrosis. Status post cystectomy right lower quadrant ileal conduit urinary diversion. Stomach/Bowel: Stomach is within normal limits. Status post ileal conduit urinary diversion. No evidence of bowel wall thickening, distention, or inflammatory changes. Vascular/Lymphatic: Aortic atherosclerosis. Right common external iliac vein stents. No enlarged abdominal or pelvic lymph nodes. Reproductive: Status post hysterectomy. Other: Right lower quadrant ileal conduit urinary diversion with parastomal hernia containing a single,  nonobstructed loop of distal ileum. Small volume fluid in the  pelvis, diminished compared to prior examination. Musculoskeletal: No acute or significant osseous findings. IMPRESSION: 1. Status post cystectomy and right lower quadrant ileal conduit urinary diversion. No hydronephrosis. 2. No noncontrast CT evidence of recurrent or metastatic disease in the chest, abdomen or pelvis. 3. Small volume fluid in the pelvis, diminished compared to prior examination. 4. Emphysema and diffuse bilateral bronchial wall thickening. 5. Coronary artery disease. Aortic Atherosclerosis (ICD10-I70.0) and Emphysema (ICD10-J43.9). Electronically Signed   By: Delanna Ahmadi M.D.   On: 04/10/2021 15:01      ASSESSMENT & PLAN:  1. Metastatic urothelial carcinoma ([place])   2. Encounter for antineoplastic immunotherapy   3. Anemia in stage 3b chronic kidney disease ([place])   4. Parastomal hernia without obstruction or gangrene   5. Stage 3b chronic kidney disease [facility])   Cancer Staging Bladder carcinoma [facility]) Staging form: Urinary Bladder, AJCC 8th Edition - Clinical stage from 08/01/2018: Stage IVA (cTX, cN3, cM1a) - Signed by [name], MD on 04/21/2019  #Metastatic urothelial carcinoma of bladder, sarcomatoid features pelvic sidewall mass biopsy showed metastatic carcinoma consistent with involvement by urothelial carcinoma., Stage IV NED-  Labs are reviewed and discussed with patient Proceed with Keytruda 200 mg today. 04/10/2021 CT chest abdomen pelvis without contrast showed no evidence of disease recurrence.  Small volume fluid in the pelvis, decreased.  Emphysema/diffuse bilateral bronchial wall thickening.  CAD.  #Hernia -parastomal hernia.   She has establish care with [facility] hernia center.  She was recommended to monitor her symptoms and aware ostomy belt  #Chronic kidney disease, worsening of her creatinine today.  Encourage patient to improve oral hydration.  #Chronic anemia secondary to stage III CKD.   Hemoglobin has further decreased to 10.4 today.   Had IV venofer, did not tolerate. Prefers no more IV venofer.  Recommend patient to continue oral iron supplementation.  #Follow-up in 4 weeks for repeat blood work, MD evaluation and Keytruda .  Patient prefers every 4 weeks treatment  .All questions were answered. The patient knows to call the clinic with any problems questions or concerns.  [name], MD, PhD 04/18/21

## 2021-04-25 ENCOUNTER — Telehealth: Payer: Self-pay | Admitting: Nurse Practitioner

## 2021-04-25 NOTE — Telephone Encounter (Signed)
Left message for patient to call back and schedule the Medicare Annual Wellness Visit (AWV) virtually or by telephone.  Last AWV 06/22/19  Please schedule at anytime with [facility] Health Advisor.  45 minute appointment  Any questions, please call me at [phone]

## 2021-05-03 IMAGING — US US EXTREM LOW VENOUS*R*
1 series · 13 of 24 positions shown · non-contrast
Comparison: None.

CLINICAL DATA: Pain and swelling for 1 week



[Series 1: us extrem low venous*right* · 13 of 38 slices shown]
[im 1/38]
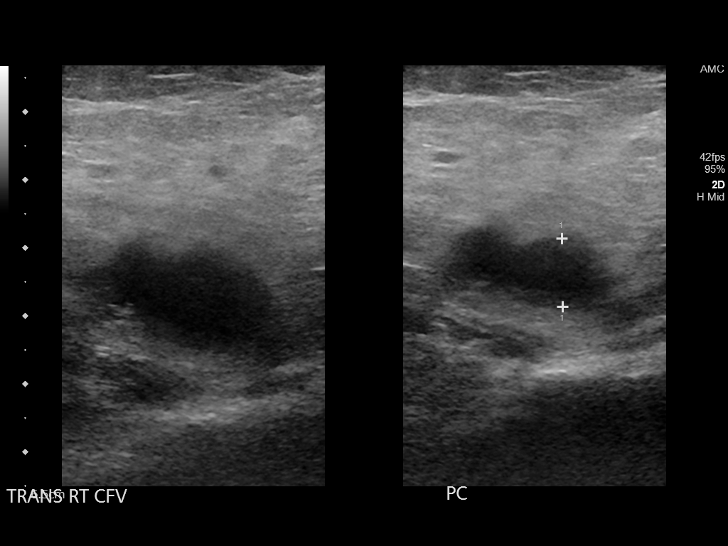
[im 4/38]
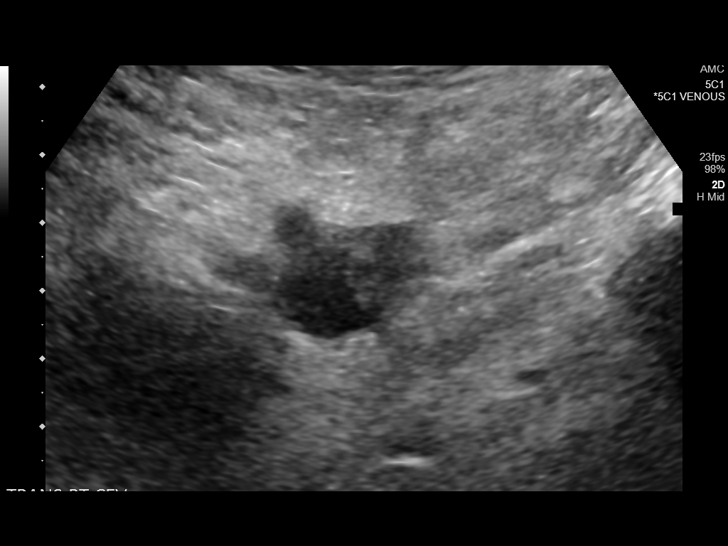
[im 7/38]
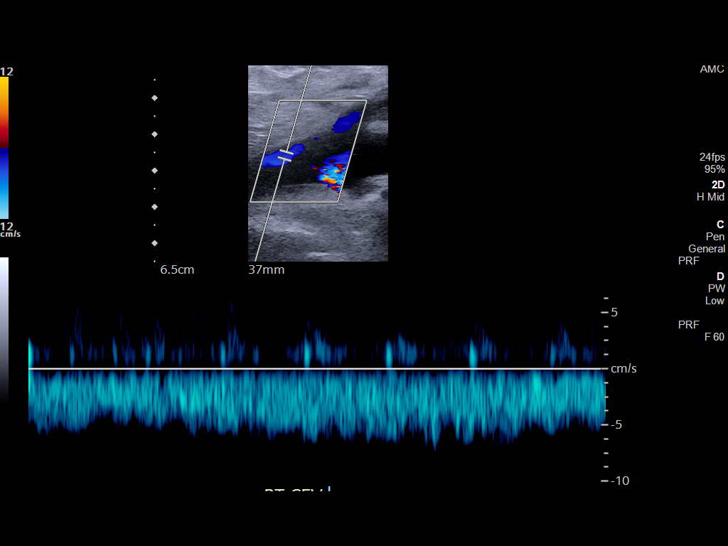
[im 10/38]
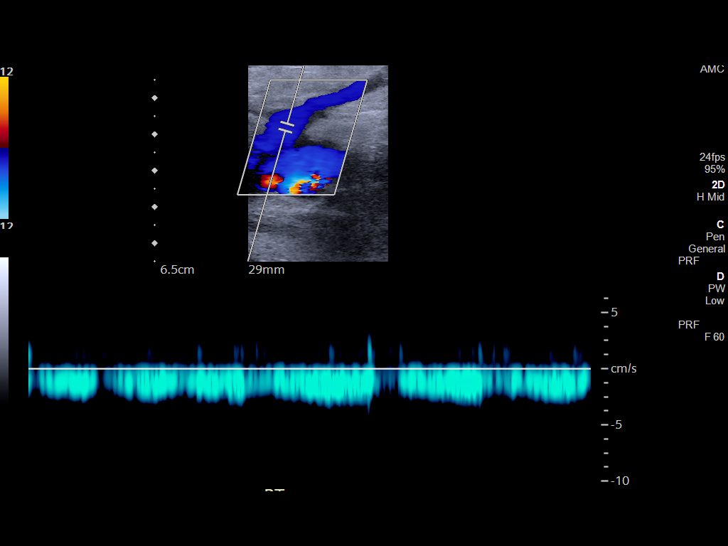
[im 13/38]
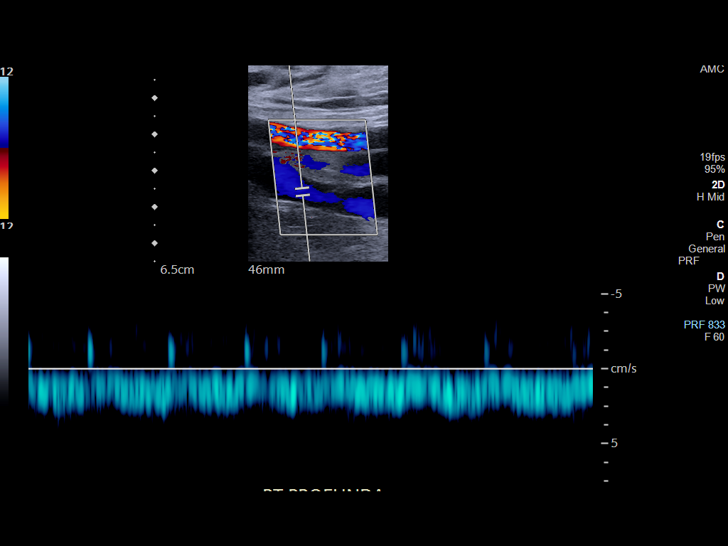
[im 17/38]
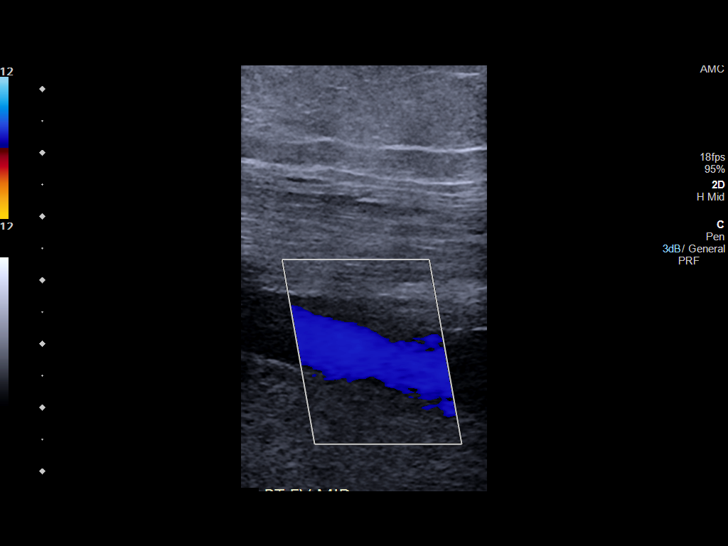
[im 20/38]
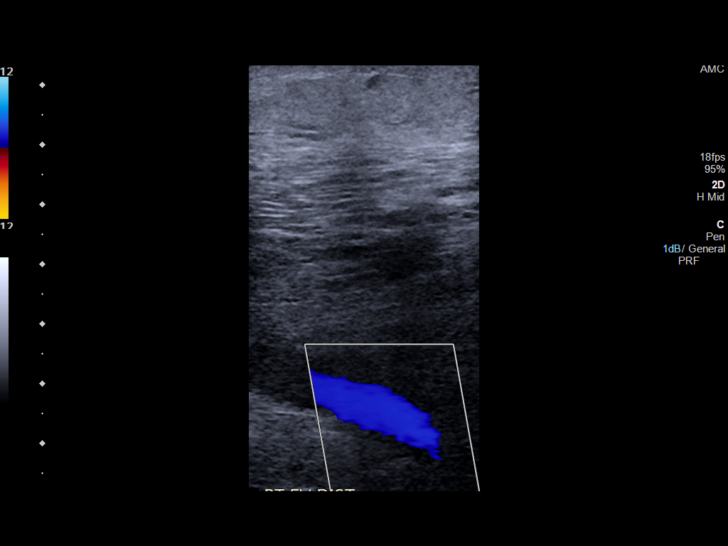
[im 21/38]
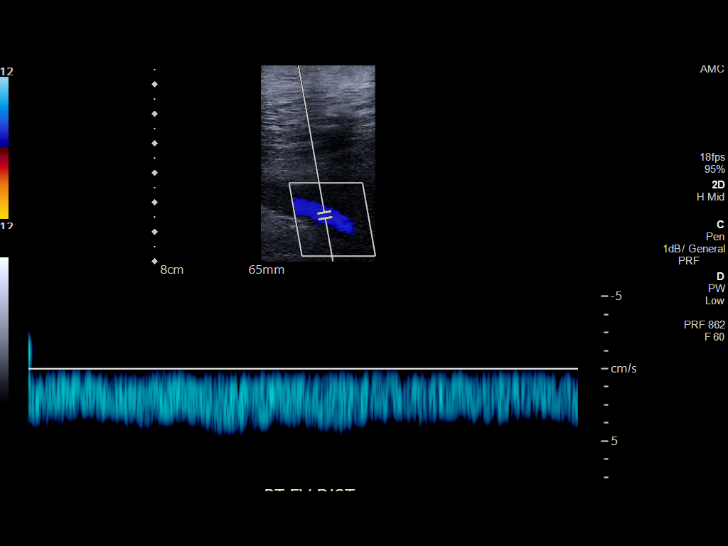
[im 25/38]
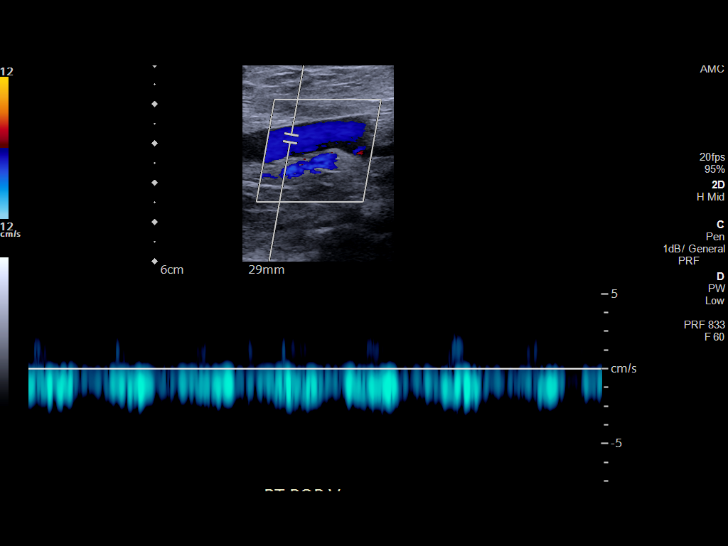
[im 28/38]
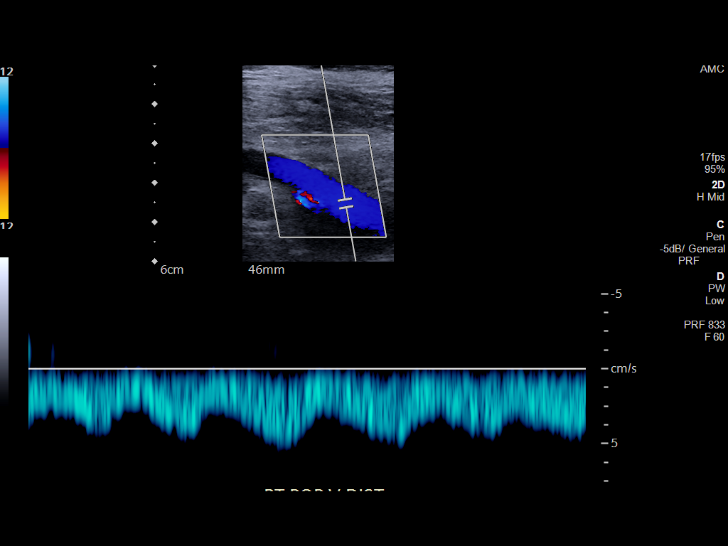
[im 31/38]
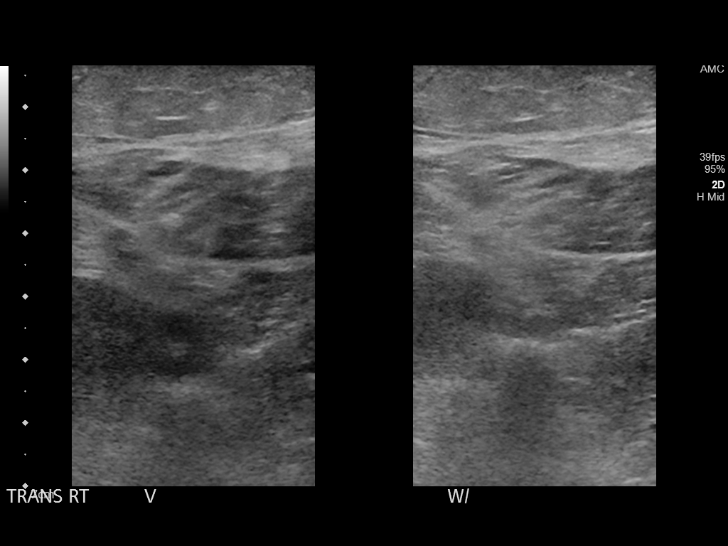
[im 34/38]
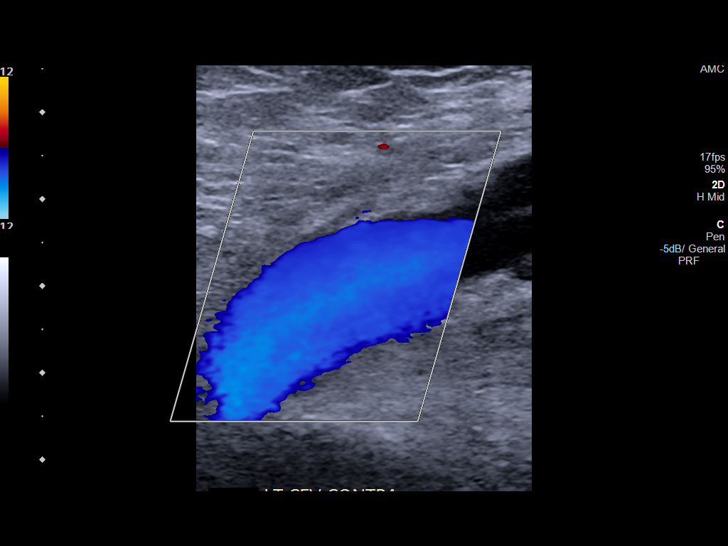
[im 38/38]
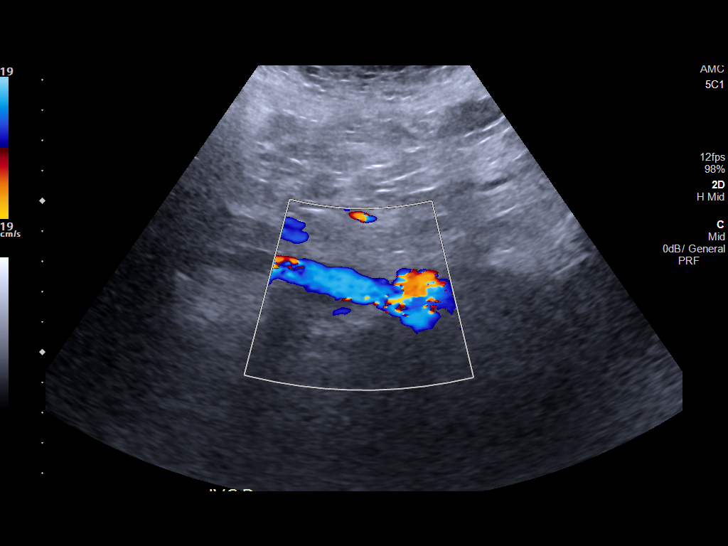

[13 of 24 positions shown; findings below may reference images not displayed]

FINDINGS: Contralateral Common Femoral Vein: Respiratory phasicity is normal
and symmetric with the symptomatic side. No evidence of thrombus.
Normal compressibility.

Common Femoral Vein: Nonocclusive thrombus within the right common
femoral vein, extends into the right external iliac vein. Vessel is
noncompressible.

Saphenofemoral Junction: Thrombus within the common femoral vein
extending to the saphenofemoral junction.

Profunda Femoral Vein: No evidence of thrombus. Normal
compressibility and flow on color Doppler imaging.

Femoral Vein: No evidence of thrombus. Normal compressibility,
respiratory phasicity and response to augmentation.

Popliteal Vein: No evidence of thrombus. Normal compressibility,
respiratory phasicity and response to augmentation.

Calf Veins: No evidence of thrombus. Normal compressibility and flow
on color Doppler imaging.

Other Findings:  Limited evaluation of the IVC shows patency.
IMPRESSION: Positive for acute nonocclusive DVT extending from the right
external iliac vein to the common femoral vein.

## 2021-05-09 ENCOUNTER — Other Ambulatory Visit: Payer: Self-pay

## 2021-05-09 DIAGNOSIS — I10 Essential (primary) hypertension: Secondary | ICD-10-CM | POA: Diagnosis not present

## 2021-05-09 DIAGNOSIS — N1832 Chronic kidney disease, stage 3b: Secondary | ICD-10-CM | POA: Diagnosis not present

## 2021-05-09 DIAGNOSIS — E1122 Type 2 diabetes mellitus with diabetic chronic kidney disease: Secondary | ICD-10-CM | POA: Diagnosis not present

## 2021-05-10 ENCOUNTER — Encounter: Payer: Self-pay | Admitting: Gastroenterology

## 2021-05-10 ENCOUNTER — Ambulatory Visit (INDEPENDENT_AMBULATORY_CARE_PROVIDER_SITE_OTHER): Payer: Medicare Other | Admitting: Gastroenterology

## 2021-05-10 ENCOUNTER — Other Ambulatory Visit: Payer: Self-pay

## 2021-05-10 VITALS — BP 152/76 | HR 86 | Temp 98.3°F | Wt 212.6 lb

## 2021-05-10 DIAGNOSIS — D509 Iron deficiency anemia, unspecified: Secondary | ICD-10-CM

## 2021-05-10 DIAGNOSIS — Z8619 Personal history of other infectious and parasitic diseases: Secondary | ICD-10-CM

## 2021-05-10 NOTE — Progress Notes (Addendum)
Jonathon Bellows MD, MRCP(U.K) [address]  [place]  [place], [place] [address]  Main: [phone]  Fax: [phone]   Primary Care Physician: [name], NP  Primary Gastroenterologist:  Dr. Jonathon Bellows   Chief complaint: Follow-up for iron deficiency anemia   HPI: Angela Russell is a 70 y.o. female  Summary of history :   Initially seen back in 2020 for constipation.  History of cystectomy for bladder mass.  At that time she had a microcytic anemia and had issues with constipation.  Plan was to perform endoscopy evaluation obtain IV iron check B12 folate and commence on Linzess. She was positive H. pylori and was advised to get treatment for it and antibiotics were prescribed.  She did not obtain her endoscopy evaluation. 01/24/2021: Hemoglobin 9.3 g, MCV 79 ferritin normal iron studies normal creatinine 1.88.  Hemoglobin stable around 9 gFor over 2 years She is receiving IV iron with Dr. Tasia Catchings.   Interval history  02/08/2021-05/10/2021     02/08/2021: H. pylori test negative   02/16/2021 hemoglobin 10.4 g with MCV of 79, CMP creatinine 1.39  03/17/2021 colonoscopy internal hemorrhoids found during retroflexion 10 sessile polyps in the ascending colon resected 2 sessile polyps in the transverse colon and 2 in the sigmoid colon.  All subcentimeter in size.EGD normal.  8 out of 10 fragments were adenomatous.  No other complaints presently denies any GI symptoms.     Current Outpatient Medications  Medication Sig Dispense Refill   amLODipine (NORVASC) 5 MG tablet Take 1 tablet by mouth once daily 90 tablet 0   diclofenac sodium (VOLTAREN) 1 % GEL Apply 2 g topically 4 (four) times daily. (Patient taking differently: Apply 2 g topically as needed.) 100 g 2   docusate sodium (COLACE) 100 MG capsule Take 1 capsule (100 mg total) by mouth daily. 90 capsule 0   lidocaine-prilocaine (EMLA) cream Apply 1 application topically as needed. 30 g 3   losartan (COZAAR) 25 MG  tablet Take 1 tablet by mouth once daily 90 tablet 0   Omega-3 Fatty Acids (FISH OIL) 1000 MG CAPS Take by mouth.     pembrolizumab (KEYTRUDA) 100 MG/4ML SOLN Inject 200 mg into the vein. Every 42 days,    Last txy 4/05, Next 5/03     polyethylene glycol (MIRALAX / GLYCOLAX) 17 g packet Take 17 g by mouth daily as needed for mild constipation. 14 each 0   rosuvastatin (CRESTOR) 40 MG tablet Take 1 tablet (40 mg total) by mouth daily. 90 tablet 1   RYBELSUS 3 MG TABS Take 1 tablet by mouth once daily 90 tablet 1   No current facility-administered medications for this visit.   Facility-Administered Medications Ordered in Other Visits  Medication Dose Route Frequency Provider Last Rate Last Admin   heparin lock flush 100 UNIT/ML injection             Allergies as of 05/10/2021   (No Known Allergies)    ROS:  General: Negative for anorexia, weight loss, fever, chills, fatigue, weakness. ENT: Negative for hoarseness, difficulty swallowing , nasal congestion. CV: Negative for chest pain, angina, palpitations, dyspnea on exertion, peripheral edema.  Respiratory: Negative for dyspnea at rest, dyspnea on exertion, cough, sputum, wheezing.  GI: See history of present illness. GU:  Negative for dysuria, hematuria, urinary incontinence, urinary frequency, nocturnal urination.  Endo: Negative for unusual weight change.    Physical Examination:   BP (!) 152/76   Pulse 86  Temp 98.3 F (36.8 C) (Oral)   Wt 212 lb 9.6 oz (96.4 kg)   BMI 37.66 kg/m   General: Well-nourished, well-developed in no acute distress.  Eyes: No icterus. Conjunctivae pink. Neuro: Alert and oriented x 3.  Grossly intact. Skin: Warm and dry, no jaundice.   Psych: Alert and cooperative, normal mood and affect.   Imaging Studies: CT CHEST ABDOMEN PELVIS WO CONTRAST  Result Date: 04/10/2021 CLINICAL DATA:  Urothelial cancer, metastatic bladder cancer restaging, status post cystectomy and ileal conduit urinary  diversion. EXAM: CT CHEST, ABDOMEN AND PELVIS WITHOUT CONTRAST TECHNIQUE: Multidetector CT imaging of the chest, abdomen and pelvis was performed following the standard protocol without IV contrast. Oral enteric processes menstrual. COMPARISON:  12/13/2020 FINDINGS: CT CHEST FINDINGS Cardiovascular: Right chest port catheter. Aortic atherosclerosis. Normal heart size. Three-vessel coronary artery calcifications. No pericardial effusion. Mediastinum/Nodes: No enlarged mediastinal, hilar, or axillary lymph nodes. Thyroid gland, trachea, and esophagus demonstrate no significant findings. Lungs/Pleura: Frothy debris in the right mainstem bronchus. Mild centrilobular emphysema. Diffuse bilateral bronchial wall thickening. No pleural effusion or pneumothorax. Musculoskeletal: No chest wall mass or suspicious bone lesions identified. CT ABDOMEN PELVIS FINDINGS Hepatobiliary: No solid liver abnormality is seen. No gallstones, gallbladder wall thickening, or biliary dilatation. Pancreas: Unremarkable. No pancreatic ductal dilatation or surrounding inflammatory changes. Spleen: Normal in size without significant abnormality. Adrenals/Urinary Tract: Adrenal glands are unremarkable. Kidneys are normal, without renal calculi, solid lesion, or hydronephrosis. Status post cystectomy right lower quadrant ileal conduit urinary diversion. Stomach/Bowel: Stomach is within normal limits. Status post ileal conduit urinary diversion. No evidence of bowel wall thickening, distention, or inflammatory changes. Vascular/Lymphatic: Aortic atherosclerosis. Right common external iliac vein stents. No enlarged abdominal or pelvic lymph nodes. Reproductive: Status post hysterectomy. Other: Right lower quadrant ileal conduit urinary diversion with parastomal hernia containing a single, nonobstructed loop of distal ileum. Small volume fluid in the pelvis, diminished compared to prior examination. Musculoskeletal: No acute or significant osseous  findings. IMPRESSION: 1. Status post cystectomy and right lower quadrant ileal conduit urinary diversion. No hydronephrosis. 2. No noncontrast CT evidence of recurrent or metastatic disease in the chest, abdomen or pelvis. 3. Small volume fluid in the pelvis, diminished compared to prior examination. 4. Emphysema and diffuse bilateral bronchial wall thickening. 5. Coronary artery disease. Aortic Atherosclerosis (ICD10-I70.0) and Emphysema (ICD10-J43.9). Electronically Signed   By: Delanna Ahmadi M.D.   On: 04/10/2021 15:01    Assessment and Plan:   Angela Russell is a 70 y.o. y/o female here to follow-up for iron deficiency anemia.  EGD was normal.  Colonoscopy showed about 12 polyps 8 of which were adenomas.  Status post treatment for H. pylori.  Celiac serology negative.  Complete evaluation for iron deficiency anemia I perform a capsule study of small bowel.   Risks, benefits, alternatives of Givens capsule discussed with patient to include but not limited to the rare risk of Given's capsule becoming lodged in the GI tract requiring surgical removal.  The patient agrees with this plan & consent will be obtained.     Dr Jonathon Bellows  MD,MRCP [facility]) Follow up in 3 months

## 2021-05-11 DIAGNOSIS — Z20822 Contact with and (suspected) exposure to covid-19: Secondary | ICD-10-CM | POA: Diagnosis not present

## 2021-05-12 ENCOUNTER — Other Ambulatory Visit: Payer: Self-pay | Admitting: Nurse Practitioner

## 2021-05-12 NOTE — Telephone Encounter (Signed)
Requested Prescriptions  Pending Prescriptions Disp Refills  . losartan (COZAAR) 25 MG tablet [Pharmacy Med Name: Losartan Potassium 25 MG Oral Tablet] 90 tablet 0    Sig: Take 1 tablet by mouth once daily     Cardiovascular:  Angiotensin Receptor Blockers Failed - 05/12/2021  2:26 AM      Failed - Cr in normal range and within 180 days    Creatinine, Ser  Date Value Ref Range Status  04/18/2021 1.72 (H) 0.44 - 1.00 mg/dL Final         Failed - Last BP in normal range    BP Readings from Last 1 Encounters:  05/10/21 (!) 152/76         Passed - K in normal range and within 180 days    Potassium  Date Value Ref Range Status  04/18/2021 4.3 3.5 - 5.1 mmol/L Final         Passed - Patient is not pregnant      Passed - Valid encounter within last 6 months    Recent Outpatient Visits          4 months ago Hypertension associated with diabetes ([place])   [facility] [name], NP   7 months ago Hypertension associated with diabetes ([place])   [facility] [name], NP   7 months ago Controlled type 2 diabetes mellitus with complication, without long-term current use of insulin ([place])   [facility] [name], NP   8 months ago Hypertension associated with diabetes [facility])   [facility] [name], NP   1 year ago Hypertension associated with diabetes [facility])   [place] Eulogio Bear, NP      Future Appointments            In 2 months [name], NP [facility], [place]   In 2 months Jonathon Bellows, [place]

## 2021-05-16 ENCOUNTER — Other Ambulatory Visit: Payer: Self-pay

## 2021-05-16 ENCOUNTER — Inpatient Hospital Stay (HOSPITAL_BASED_OUTPATIENT_CLINIC_OR_DEPARTMENT_OTHER): Payer: Medicare Other | Admitting: Oncology

## 2021-05-16 ENCOUNTER — Encounter: Payer: Self-pay | Admitting: Oncology

## 2021-05-16 ENCOUNTER — Inpatient Hospital Stay: Payer: Medicare Other | Admitting: Licensed Clinical Social Worker

## 2021-05-16 ENCOUNTER — Inpatient Hospital Stay: Payer: Medicare Other | Attending: Oncology

## 2021-05-16 ENCOUNTER — Encounter: Payer: Self-pay | Admitting: Licensed Clinical Social Worker

## 2021-05-16 ENCOUNTER — Inpatient Hospital Stay: Payer: Medicare Other

## 2021-05-16 VITALS — BP 158/71 | HR 80 | Temp 97.0°F | Wt 213.0 lb

## 2021-05-16 VITALS — BP 143/70 | HR 59

## 2021-05-16 DIAGNOSIS — Z86718 Personal history of other venous thrombosis and embolism: Secondary | ICD-10-CM | POA: Insufficient documentation

## 2021-05-16 DIAGNOSIS — Z87891 Personal history of nicotine dependence: Secondary | ICD-10-CM | POA: Insufficient documentation

## 2021-05-16 DIAGNOSIS — Z5112 Encounter for antineoplastic immunotherapy: Secondary | ICD-10-CM

## 2021-05-16 DIAGNOSIS — N1832 Chronic kidney disease, stage 3b: Secondary | ICD-10-CM | POA: Insufficient documentation

## 2021-05-16 DIAGNOSIS — C7910 Secondary malignant neoplasm of unspecified urinary organs: Secondary | ICD-10-CM

## 2021-05-16 DIAGNOSIS — C679 Malignant neoplasm of bladder, unspecified: Secondary | ICD-10-CM

## 2021-05-16 DIAGNOSIS — Z79899 Other long term (current) drug therapy: Secondary | ICD-10-CM | POA: Insufficient documentation

## 2021-05-16 DIAGNOSIS — D631 Anemia in chronic kidney disease: Secondary | ICD-10-CM | POA: Diagnosis not present

## 2021-05-16 DIAGNOSIS — D649 Anemia, unspecified: Secondary | ICD-10-CM | POA: Diagnosis not present

## 2021-05-16 DIAGNOSIS — Z860100 Personal history of colon polyps, unspecified: Secondary | ICD-10-CM

## 2021-05-16 LAB — CBC WITH DIFFERENTIAL/PLATELET
Abs Immature Granulocytes: 0.02 10*3/uL (ref 0.00–0.07)
Basophils Absolute: 0 10*3/uL (ref 0.0–0.1)
Basophils Relative: 1 %
Eosinophils Absolute: 0.2 10*3/uL (ref 0.0–0.5)
Eosinophils Relative: 4 %
HCT: 31.7 % — ABNORMAL LOW (ref 36.0–46.0)
Hemoglobin: 9.8 g/dL — ABNORMAL LOW (ref 12.0–15.0)
Immature Granulocytes: 0 %
Lymphocytes Relative: 28 %
Lymphs Abs: 1.3 10*3/uL (ref 0.7–4.0)
MCH: 24.3 pg — ABNORMAL LOW (ref 26.0–34.0)
MCHC: 30.9 g/dL (ref 30.0–36.0)
MCV: 78.7 fL — ABNORMAL LOW (ref 80.0–100.0)
Monocytes Absolute: 0.3 10*3/uL (ref 0.1–1.0)
Monocytes Relative: 7 %
Neutro Abs: 2.8 10*3/uL (ref 1.7–7.7)
Neutrophils Relative %: 60 %
Platelets: 250 10*3/uL (ref 150–400)
RBC: 4.03 MIL/uL (ref 3.87–5.11)
RDW: 14.6 % (ref 11.5–15.5)
WBC: 4.7 10*3/uL (ref 4.0–10.5)
nRBC: 0 % (ref 0.0–0.2)

## 2021-05-16 LAB — COMPREHENSIVE METABOLIC PANEL
ALT: 12 U/L (ref 0–44)
AST: 21 U/L (ref 15–41)
Albumin: 3.7 g/dL (ref 3.5–5.0)
Alkaline Phosphatase: 98 U/L (ref 38–126)
Anion gap: 11 (ref 5–15)
BUN: 26 mg/dL — ABNORMAL HIGH (ref 8–23)
CO2: 21 mmol/L — ABNORMAL LOW (ref 22–32)
Calcium: 8.9 mg/dL (ref 8.9–10.3)
Chloride: 107 mmol/L (ref 98–111)
Creatinine, Ser: 1.56 mg/dL — ABNORMAL HIGH (ref 0.44–1.00)
GFR, Estimated: 35 mL/min — ABNORMAL LOW (ref >60)
Glucose, Bld: 139 mg/dL — ABNORMAL HIGH (ref 70–99)
Potassium: 4.3 mmol/L (ref 3.5–5.1)
Sodium: 139 mmol/L (ref 135–145)
Total Bilirubin: 0.3 mg/dL (ref 0.3–1.2)
Total Protein: 7.4 g/dL (ref 6.5–8.1)

## 2021-05-16 MED ORDER — HEPARIN SOD (PORK) LOCK FLUSH 100 UNIT/ML IV SOLN
500.0000 [IU] | Freq: Once | INTRAVENOUS | Status: DC
  Filled 2021-05-16: qty 5

## 2021-05-16 MED ORDER — SODIUM CHLORIDE 0.9 % IV SOLN
Freq: Once | INTRAVENOUS | Status: AC
  Filled 2021-05-16: qty 250

## 2021-05-16 MED ORDER — SODIUM CHLORIDE 0.9% FLUSH
10.0000 mL | Freq: Once | INTRAVENOUS | Status: AC
  Administered 2021-05-16: 10 mL via INTRAVENOUS
  Filled 2021-05-16: qty 10

## 2021-05-16 MED ORDER — SODIUM CHLORIDE 0.9% FLUSH
10.0000 mL | INTRAVENOUS | Status: DC | PRN
  Filled 2021-05-16: qty 10

## 2021-05-16 MED ORDER — HEPARIN SOD (PORK) LOCK FLUSH 100 UNIT/ML IV SOLN
500.0000 [IU] | Freq: Once | INTRAVENOUS | Status: AC | PRN
  Filled 2021-05-16: qty 5

## 2021-05-16 MED ORDER — SODIUM CHLORIDE 0.9 % IV SOLN
200.0000 mg | Freq: Once | INTRAVENOUS | Status: AC
  Administered 2021-05-16: 200 mg via INTRAVENOUS
  Filled 2021-05-16: qty 8

## 2021-05-16 MED ORDER — HEPARIN SOD (PORK) LOCK FLUSH 100 UNIT/ML IV SOLN
INTRAVENOUS | Status: AC
  Administered 2021-05-16: 500 [IU]
  Filled 2021-05-16: qty 5

## 2021-05-16 NOTE — Patient Instructions (Signed)
[facility]  Discharge Instructions: °Thank you for choosing [facility] to provide your oncology and hematology care.  °If you have a lab appointment with the Cancer Center, please go directly to the Cancer Center and check in at the registration area. ° °Wear comfortable clothing and clothing appropriate for easy access to any Portacath or PICC line.  ° °We strive to give you quality time with your provider. You may need to reschedule your appointment if you arrive late (15 or more minutes).  Arriving late affects you and other patients whose appointments are after yours.  Also, if you miss three or more appointments without notifying the office, you may be dismissed from the clinic at the provider’s discretion.    °  °For prescription refill requests, have your pharmacy contact our office and allow 72 hours for refills to be completed.   ° °Today you received the following chemotherapy and/or immunotherapy agents Keytruda °    °  °To help prevent nausea and vomiting after your treatment, we encourage you to take your nausea medication as directed. ° °BELOW ARE SYMPTOMS THAT SHOULD BE REPORTED IMMEDIATELY: °*FEVER GREATER THAN 100.4 F (38 °C) OR HIGHER °*CHILLS OR SWEATING °*NAUSEA AND VOMITING THAT IS NOT CONTROLLED WITH YOUR NAUSEA MEDICATION °*UNUSUAL SHORTNESS OF BREATH °*UNUSUAL BRUISING OR BLEEDING °*URINARY PROBLEMS (pain or burning when urinating, or frequent urination) °*BOWEL PROBLEMS (unusual diarrhea, constipation, pain near the anus) °TENDERNESS IN MOUTH AND THROAT WITH OR WITHOUT PRESENCE OF ULCERS (sore throat, sores in mouth, or a toothache) °UNUSUAL RASH, SWELLING OR PAIN  °UNUSUAL VAGINAL DISCHARGE OR ITCHING  ° °Items with * indicate a potential emergency and should be followed up as soon as possible or go to the Emergency Department if any problems should occur. ° °Please show the CHEMOTHERAPY ALERT CARD or IMMUNOTHERAPY ALERT CARD at check-in to  the Emergency Department and triage nurse. ° °Should you have questions after your visit or need to cancel or reschedule your appointment, please contact [facility]  [phone] and follow the prompts.  Office hours are 8:00 a.m. to 4:30 p.m. Monday - Friday. Please note that voicemails left after 4:00 p.m. may not be returned until the following business day.  We are closed weekends and major holidays. You have access to a nurse at all times for urgent questions. Please call the main number to the clinic [phone] and follow the prompts. ° °For any non-urgent questions, you may also contact your provider using MyChart. We now offer e-Visits for anyone 18 and older to request care online for non-urgent symptoms. For details visit [email or web address]. °  °Also download the MyChart app! Go to the app store, search "MyChart", open the app, select [facility], and log in with your MyChart username and password. ° °Due to Covid, a mask is required upon entering the hospital/clinic. If you do not have a mask, one will be given to you upon arrival. For doctor visits, patients may have 1 support person aged 18 or older with them. For treatment visits, patients cannot have anyone with them due to current Covid guidelines and our immunocompromised population.  °

## 2021-05-16 NOTE — Progress Notes (Signed)
REFERRING PROVIDER: Jonathon Bellows, MD [place],  [place] [address]  PRIMARY PROVIDER:  [name], NP  PRIMARY REASON FOR VISIT:  1. Personal history of colonic polyps   2. Bladder carcinoma (HCC)      HISTORY OF PRESENT ILLNESS:   Angela Russell, a 70 y.o. female, was seen for a [place] cancer genetics consultation at the request of Dr. Vicente Males due to a personal history of colon polyps.  [name] presents to clinic today to discuss the possibility of a hereditary predisposition to cancer, genetic testing, and to further clarify her future cancer risks, as well as potential cancer risks for family members.   In 37, at the age of 14, [name] was diagnosed with bladder cancer. She sees Dr. Tasia Catchings for this indication.   CANCER HISTORY:  Oncology History  Bladder carcinoma ([place])  08/01/2018 Cancer Staging   Staging form: Urinary Bladder, AJCC 8th Edition - Clinical stage from 08/01/2018: Stage IVA (cTX, cN3, cM1a) - Signed by [name], MD on 04/21/2019    08/03/2018 Initial Diagnosis   Bladder carcinoma ([place])   08/12/2018 - 08/14/2018 Chemotherapy   The patient had dexamethasone (DECADRON) 4 MG tablet, 1 of 1 cycle, Start date: 08/03/2018, End date: 08/26/2018 DOXOrubicin (ADRIAMYCIN) chemo injection 62 mg, 30 mg/m2 = 62 mg, Intravenous,  Once, 1 of 4 cycles Administration: 62 mg (08/13/2018) palonosetron (ALOXI) injection 0.25 mg, 0.25 mg, Intravenous,  Once, 1 of 4 cycles Administration: 0.25 mg (08/13/2018), 0.25 mg (08/14/2018) pegfilgrastim (NEULASTA ONPRO KIT) injection 6 mg, 6 mg, Subcutaneous, Once, 1 of 4 cycles Administration: 6 mg (08/14/2018) methotrexate (PF) 61.25 mg in sodium chloride 0.9 % 50 mL chemo infusion, 30 mg/m2 = 61.25 mg, Intravenous,  Once, 1 of 4 cycles Administration: 61.25 mg (08/12/2018) CISplatin (PLATINOL) 71 mg in sodium chloride 0.9 % 250 mL chemo infusion, 35 mg/m2 = 71 mg (100 % of original dose 35 mg/m2), Intravenous,  Once,  1 of 4 cycles Dose modification: 35 mg/m2 (original dose 35 mg/m2, Cycle 1, Reason: Provider Judgment) Administration: 71 mg (08/13/2018), 71 mg (08/14/2018) vinBLAStine (VELBAN) 6.1 mg in sodium chloride 0.9 % 50 mL chemo infusion, 3 mg/m2 = 6.1 mg, Intravenous, Once, 1 of 4 cycles Administration: 6.1 mg (08/13/2018) fosaprepitant (EMEND) 150 mg, dexamethasone (DECADRON) 12 mg in sodium chloride 0.9 % 145 mL IVPB, , Intravenous,  Once, 1 of 4 cycles Administration:  (08/13/2018),  (08/14/2018)   for chemotherapy treatment.     Metastatic urothelial carcinoma ([place])  03/23/2019 Initial Diagnosis   Metastatic urothelial carcinoma ([place])   03/31/2019 -  Chemotherapy   Patient is on Treatment Plan : Bladder - Pembrolizumab Q28D        RISK FACTORS:  Menarche was at age 57.  First live birth at age 66.  Partial hysterectomy. Menopausal status: postmenopausal.  Colonoscopy: yes;  2022 cscope - 14 polyps (sessile serrated, tubular adenomas, hyperplastic), has had other cscopes, unsure amount of polyps .   Past Medical History:  Diagnosis Date   Carotid artery plaque, right 01/2014   CKD (chronic kidney disease)    stage 2-3   Controlled diabetes mellitus type 2 with complications (HCC)    Diabetes mellitus without complication (HCC)    DM (diabetes mellitus), type 2, uncontrolled    Hyperlipidemia    Hypertension    Hypochromic microcytic anemia    mild   Metastatic urothelial carcinoma ([place]) 03/23/2019   Osteoporosis    Personal history of colonic polyps  Renal insufficiency    Rotator cuff tendonitis, right     Past Surgical History:  Procedure Laterality Date   ABDOMINAL HYSTERECTOMY  2000   due to bleeding and fibroids, partial- still has ovaries   COLONOSCOPY WITH PROPOFOL N/A 03/17/2021   Procedure: COLONOSCOPY WITH PROPOFOL;  Surgeon: Jonathon Bellows, MD;  [facility];  Service: Gastroenterology;  Laterality: N/A;   CYSTOSCOPY W/ RETROGRADES Bilateral  07/16/2018   Procedure: CYSTOSCOPY WITH RETROGRADE PYELOGRAM;  Surgeon: Billey Co, MD;  Location: [facility] ORS;  Service: Urology;  Laterality: Bilateral;   ESOPHAGOGASTRODUODENOSCOPY  03/17/2021   Procedure: ESOPHAGOGASTRODUODENOSCOPY (EGD);  Surgeon: Jonathon Bellows, MD;  Location: [facility] ENDOSCOPY;  Service: Gastroenterology;;   IVC FILTER INSERTION N/A 03/30/2019   Procedure: IVC FILTER INSERTION;  Surgeon: Algernon Huxley, MD;  Location: [place] [facility] CV LAB;  Service: Cardiovascular;  Laterality: N/A;   IVC FILTER REMOVAL N/A 06/15/2019   Procedure: IVC FILTER REMOVAL;  Surgeon: Algernon Huxley, MD;  Location: [place] CV LAB;  Service: Cardiovascular;  Laterality: N/A;   KNEE SURGERY Left 03/17/2013   torn meniscus   PERIPHERAL VASCULAR THROMBECTOMY Right 03/30/2019   Procedure: PERIPHERAL VASCULAR THROMBECTOMY;  Surgeon: Algernon Huxley, MD;  Location: [place] CV LAB;  Service: Cardiovascular;  Laterality: Right;   PORTA CATH INSERTION N/A 08/06/2018   Procedure: PORTA CATH INSERTION;  Surgeon: Algernon Huxley, MD;  Location: [place] CV LAB;  Service: Cardiovascular;  Laterality: N/A;   TRANSURETHRAL RESECTION OF BLADDER TUMOR N/A 07/16/2018   Procedure: TRANSURETHRAL RESECTION OF BLADDER TUMOR (TURBT);  Surgeon: Billey Co, MD;  Location: [facility] ORS;  Service: Urology;  Laterality: N/A;    Social History   Socioeconomic History   Marital status: Single    Spouse name: Not on file   Number of children: 1   Years of education: Not on file   Highest education level: 10th grade  Occupational History   Not on file  Tobacco Use   Smoking status: Former    Types: Cigarettes    Quit date: 06/05/1991    Years since quitting: 29.9   Smokeless tobacco: Never  Vaping Use   Vaping Use: Never used  Substance and Sexual Activity   Alcohol use: No   Drug use: No   Sexual activity: Not Currently  Other Topics Concern   Not on file  Social History Narrative   Working full time    Social Determinants of Radio broadcast assistant Strain: Low Risk    Difficulty of Paying Living Expenses: Not hard at all  Food Insecurity: Not on file  Transportation Needs: Not on file  Physical Activity: Not on file  Stress: Not on file  Social Connections: Not on file     FAMILY HISTORY:  We obtained a detailed, 4-generation family history.  Significant diagnoses are listed below: Family History  Problem Relation Age of Onset   Heart disease Mother    Heart attack Mother    Arthritis Father    Diabetes Brother    Ms. Ivens has limited family history information. She has 1 son who is in his 69s. She had 2 brothers, 4 sisters, no cancers or polyps she is aware of. No known cancers in maternal relatives, mother died at 34. No known cancers/polyps in paternal relatives, father died in his 40s.  Ms. Eberly is unaware of previous family history of genetic testing for hereditary cancer risks. There is no reported Ashkenazi Jewish ancestry. There is no  known consanguinity.    GENETIC COUNSELING ASSESSMENT: Ms. Ciliberto is a 70 y.o. female with a personal history of colon polyps which is somewhat suggestive of a hereditary polyposis/cancer syndrome and predisposition to cancer. We, therefore, discussed and recommended the following at today's visit.   DISCUSSION: We discussed that polyps in general are common, however, most people have fewer than 5 lifetime polyps.  When an individual has 10 or more polyps we become concerned about an underlying polyposis syndrome.  The most common hereditary polyposis syndromes are caused by problems in the APC and MUTYH genes. We discussed that testing is beneficial for several reasons including knowing how to follow individuals for cancer screenings, and understand if other family members could be at risk for cancer and allow them to undergo genetic testing.   We reviewed the characteristics, features and inheritance patterns of hereditary  cancer syndromes. We also discussed genetic testing, including the appropriate family members to test, the process of testing, insurance coverage and turn-around-time for results. We discussed the implications of a negative, positive and/or variant of uncertain significant result. We recommended Ms. Quentin Cornwall pursue genetic testing for the [facility] Multi-Cancer+RNA gene panel.   The Multi-Cancer Panel + RNA offered by [facility] includes sequencing and/or deletion duplication testing of the following 84 genes: AIP, ALK, APC, ATM, AXIN2,BAP1,  BARD1, BLM, BMPR1A, BRCA1, BRCA2, BRIP1, CASR, CDC73, CDH1, CDK4, CDKN1B, CDKN1C, CDKN2A (p14ARF), CDKN2A (p16INK4a), CEBPA, CHEK2, CTNNA1, DICER1, DIS3L2, EGFR (c.2369C>T, p.Thr790Met variant only), EPCAM (Deletion/duplication testing only), FH, FLCN, GATA2, GPC3, GREM1 (Promoter region deletion/duplication testing only), HOXB13 (c.251G>A, p.Gly84Glu), HRAS, KIT, MAX, MEN1, MET, MITF (c.952G>A, p.Glu318Lys variant only), MLH1, MSH2, MSH3, MSH6, MUTYH, NBN, NF1, NF2, NTHL1, PALB2, PDGFRA, PHOX2B, PMS2, POLD1, POLE, POT1, PRKAR1A, PTCH1, PTEN, RAD50, RAD51C, RAD51D, RB1, RECQL4, RET, RUNX1, SDHAF2, SDHA (sequence changes only), SDHB, SDHC, SDHD, SMAD4, SMARCA4, SMARCB1, SMARCE1, STK11, SUFU, TERC, TERT, TMEM127, TP53, TSC1, TSC2, VHL, WRN and WT1.  Based on Ms. Debes's personal history of polyps and cancer, she meets medical criteria for genetic testing. Despite that she meets criteria, she may still have an out of pocket cost. We discussed that if her out of pocket cost for testing is over $100, the laboratory will call and confirm whether she wants to proceed with testing.  If the out of pocket cost of testing is less than $100 she will be billed by the genetic testing laboratory.   PLAN: Despite our recommendation, Ms. Delamora did not wish to pursue genetic testing at today's visit. We understand this decision and remain available to coordinate genetic testing at any  time in the future. We, therefore, recommend Ms. Spader continue to follow the cancer screening guidelines given by her primary healthcare provider.  Ms. Elizardo questions were answered to her satisfaction today. Our contact information was provided should additional questions or concerns arise. Thank you for the referral and allowing [place] to share in the care of your patient.   Faith Rogue, MS, [facility] Genetic Counselor [email or web address] Phone: [phone]  The patient was seen for a total of 15 minutes in face-to-face genetic counseling.  Patient was seen alone in infusion room.  Dr. Grayland Ormond was available for discussion regarding this case.   _______________________________________________________________________ For Office Staff:  Number of people involved in session: 1 Was an Intern/ student involved with case: no

## 2021-05-16 NOTE — Progress Notes (Signed)
°Hematology/Oncology follow up note °[phone] [phone] ° ° °Patient Care Team: °Holdsworth, Karen, NP as PCP - General °Nice, Keith B, OD (Optometry) °Brodie Scovell, MD as Consulting Physician (Hematology and Oncology) °Harris, Julie S, RPH (Inactive) (Pharmacist) ° ° °CHIEF COMPLAINTS/REASON FOR VISIT:  °Follow up for bladder cancer, anemia.  ° °HISTORY OF PRESENTING ILLNESS:  °Angela Russell is a  70 y.o.  female with PMH listed below who was referred to me for evaluation of newly diagnosed bladder cancer. °Patient initially presented to emergency room at the end of January 2020 for evaluation of dysuria, hematuria and the left lower quadrant inguinal pain and flank pain.  1/28 2020 CT renal stone study showed suspected irregular wall thickening about the superior bladder, not well assessed due to degree of bladder distention.  Recommend cystoscopy for further evaluation.  No renal stone or obstructive uropathy.  Patient was given IV Rocephin and referred patient for outpatient urology follow-up.  Urine culture was negative.  She again presented to ER after 2 days with similar symptoms.  °Patient has 25-pack-year smoking history, quit approximately 20 years ago.  No family history of any urology malignancies °07/03/2018 another CT abdomen pelvis with contrast was done which showed no nephrolithiasis or hydronephrosis is identified.  Bladder is decompressed limiting evaluation. ° 07/16/2018.urology Dr. Sinskey - cystoscopy and bilateral retrograde pyelogram on 07/16/2018.  Pyelogram did not show any filling defect or abnormalities.  No hydronephrosis.  Ureteral orifice was not involved with tumor.  There is a large 5 cm posterior wall bladder tumor, bullous and sessile appearing.  Patient underwent TURBT.  ° °Pathology: High-grade urothelial carcinoma, invasive into muscularis propria.  Lymphovascular invasion is present.  Carcinoma in situ is also identified.  Focal squamous differentiation is  noted, areas of invasive carcinoma display pleomorphic/sarcomatoid changes.  T2b ° °08/07/2018 CT without contrast negative.  2 subpleural right upper lobe nodule 2 to 3 mm likely benign. °She also had baseline audiometry done.  °08/04/2018 ddMVAC x 1 cycle, stopped due to intolerance and AKI. ° °Patient received 1 cycle of dd MVAC, not able to tolerate due to AKI. °Patient then was referred to [facility]  °09/22/2018 patient underwent a cystectomy, pathology pT3a N0 Mx.  11 lymph nodes were harvested and was all negative. °Invasive urothelia carcinoma, high grade, with sarcomatoid features.  ° °10/14/2018 patient was admitted due to pyelonephritis and a pelvic fluid collection.  Drain was placed. °10/23/2018 drain was removed. °Patient has had difficulties getting to her appointments to [facility]. °02/09/2019, patient presented with abdominal pain. °CT concerning for small bowel obstruction and increased size of right pelvic fluid collection concerning for cancer recurrence.  Right hydronephrosis to the level of pelvis and enlarged lymph nodes. °Patient had JP drain placed with CT guidance to pelvic fluid collection and drained 400 cc amber fluid.  Culture was negative for growth of microorganisms and cytology was negative for malignancy.-JP drain was removed on the day of discharge. °CT-guided core biopsy of pelvic lymph node adenopathy was attempted but not successful. ° °# Right lower extremity DVT, provoked by Transvenous biopsy  °02/26/2019 transvenous biopsy by IR unsuccessful transvenous biopsy of the right pelvis mass. °Post biopsy acute thrombus in the right external iliac and common femoral vein.  Patient was recommended to start anticoagulation with Xarelto however due to the co-pay, patient is not able to afford the medication. °Anticoagulation regimen was switched to Eliquis 5 mg.  Patient reports that she has been taking it once a   day. 03/20/2019 PET showed FDG avid tissue in the cystectomy bed,  retroperitoneal and pelvic lymphadenopathy.  Right common iliac DVT.  # increased right lower extremity swelling.  She was started on Eliquis for anticoagulation by [facility].  We clarified with her pharmacy and she was actually taking Eliquis 2.5 mg twice daily. Right lower extremity swelling has not improved but instead worsened. 03/16/2019 She had ultrasound right lower extremity done which showed persistent extensive proximal right lower extremity DVT.  Anticoagulation regimen has increased to Eliquis 5 mg twice daily.  # establish care with [facility] oncology Dr. [name] for evaluation.  Dr. [name] recommended starting immunotherapy with PD-L1 inhibitor Pembrolizumab 200 mg every 3 weeks.  The sarcomatoid histology may not respond to chemotherapy well, could portend a better chance of response into immunotherapy PET scan after 4 cycles of Keytruda showed single mildly enlarged central mesenteric lymph node in the upper pelvis with SUV 9.6.  No other abnormal hypermetabolic activity was reported.  # #Right lower extremity  femoral vein DVT provoked by transvenous biopsy in the context of cancer recurrence, Status post IV C filter and mechanical thrombectomy.  IVC filter has been removed. She is currently off anticoagulation after she developed hematuria.  # 03/30/2019 Status post IVC filter placement, mechanical thrombectomy.-IVC filter was retrieved in January 2021. # PD-L1 CPS 100%.  # 03/31/2019 started on immunotherapy Keytruda.  # 04/26/2020 CT chest abdomen pelvis was reviewed and discussed with patient. No definitive finding of disease recurrence or metastasis.  Small amount of ascites similar to prior. There is a parastomal hernia containing a small margin of adjacent small bowel.  This is associated with wall thickening in approximately 7 cm segment of bowel adjacent to the hernia.  There is adjacent abnormal stranding of the mesentery and omentum.  # 08/19/2020, CT chest abdomen pelvis  showed no evidence of new/progressive metastatic disease. NED Hold off treatment due to uncontrolled hypotension.  # Chronic abdominal discomfort due to hernia. 08/11/2020, patient was seen by Dr. Dolphus Jenny for parastomal hernia.  Patient has no obstructive symptoms and would like to continue to monitor her symptoms and hold off any intervention at this point.  Patient was recommended to wear ostomy hernia belt and was referred to [facility] wound ostomy nursing team.  04/10/2021 CT chest abdomen pelvis without contrast showed no evidence of disease recurrence.  Small volume fluid in the pelvis, decreased.  Emphysema/diffuse bilateral bronchial wall thickening.  CAD.  She received IV venofer previously and did not tolerate due to energized joint pain afterwards.  #Hernia -parastomal hernia.   She has establish care with [facility] hernia center.  She was recommended to monitor her symptoms and aware ostomy belt  INTERVAL HISTORY Angela Russell is a 70 y.o. female who has above history reviewed by me today presents for follow up visit for evaluation form metastatic high-grade urothelial carcinoma of the bladder. Sarcomatoid feature.  Patient has been on maintenance immunotherapy.   She is currently on Keytruda every 4 weeks per patient's request. Patient has no new complaints.  Review of Systems  Constitutional:  Negative for appetite change, chills, fatigue and fever.  HENT:   Negative for hearing loss and voice change.   Eyes:  Negative for eye problems.  Respiratory:  Negative for chest tightness and cough.   Cardiovascular:  Negative for chest pain and leg swelling.  Gastrointestinal:  Positive for constipation. Negative for abdominal distention, abdominal pain and blood in stool.  Endocrine: Negative for hot flashes.  Genitourinary:  Negative for difficulty urinating and frequency.   Musculoskeletal:  Positive for arthralgias.  Skin:  Negative for itching and rash.  Neurological:  Positive for  numbness. Negative for extremity weakness.  Hematological:  Negative for adenopathy.  Psychiatric/Behavioral:  Negative for confusion. The patient is not nervous/anxious.    MEDICAL HISTORY:  Past Medical History:  Diagnosis Date   Carotid artery plaque, right 01/2014   CKD (chronic kidney disease)    stage 2-3   Controlled diabetes mellitus type 2 with complications (HCC)    Diabetes mellitus without complication (HCC)    DM (diabetes mellitus), type 2, uncontrolled    Hyperlipidemia    Hypertension    Hypochromic microcytic anemia    mild   Metastatic urothelial carcinoma ([place]) 03/23/2019   Osteoporosis    Personal history of colonic polyps    Renal insufficiency    Rotator cuff tendonitis, right     SURGICAL HISTORY: Past Surgical History:  Procedure Laterality Date   ABDOMINAL HYSTERECTOMY  2000   due to bleeding and fibroids, partial- still has ovaries   COLONOSCOPY WITH PROPOFOL N/A 03/17/2021   Procedure: COLONOSCOPY WITH PROPOFOL;  Surgeon: Jonathon Bellows, MD;  Location: [facility] ENDOSCOPY;  Service: Gastroenterology;  Laterality: N/A;   CYSTOSCOPY W/ RETROGRADES Bilateral 07/16/2018   Procedure: CYSTOSCOPY WITH RETROGRADE PYELOGRAM;  Surgeon: Billey Co, MD;  Location: [facility] ORS;  Service: Urology;  Laterality: Bilateral;   ESOPHAGOGASTRODUODENOSCOPY  03/17/2021   Procedure: ESOPHAGOGASTRODUODENOSCOPY (EGD);  Surgeon: Jonathon Bellows, MD;  Location: [facility] ENDOSCOPY;  Service: Gastroenterology;;   IVC FILTER INSERTION N/A 03/30/2019   Procedure: IVC FILTER INSERTION;  Surgeon: Algernon Huxley, MD;  Location: [place] CV LAB;  Service: Cardiovascular;  Laterality: N/A;   IVC FILTER REMOVAL N/A 06/15/2019   Procedure: IVC FILTER REMOVAL;  Surgeon: Algernon Huxley, MD;  Location: [place] CV LAB;  Service: Cardiovascular;  Laterality: N/A;   KNEE SURGERY Left 03/17/2013   torn meniscus   PERIPHERAL VASCULAR THROMBECTOMY Right 03/30/2019   Procedure: PERIPHERAL VASCULAR  THROMBECTOMY;  Surgeon: Algernon Huxley, MD;  Location: [place] CV LAB;  Service: Cardiovascular;  Laterality: Right;   PORTA CATH INSERTION N/A 08/06/2018   Procedure: PORTA CATH INSERTION;  Surgeon: Algernon Huxley, MD;  Location: [place] CV LAB;  Service: Cardiovascular;  Laterality: N/A;   TRANSURETHRAL RESECTION OF BLADDER TUMOR N/A 07/16/2018   Procedure: TRANSURETHRAL RESECTION OF BLADDER TUMOR (TURBT);  Surgeon: Billey Co, MD;  Location: [facility] ORS;  Service: Urology;  Laterality: N/A;    SOCIAL HISTORY: Social History   Socioeconomic History   Marital status: Single    Spouse name: Not on file   Number of children: 1   Years of education: Not on file   Highest education level: 10th grade  Occupational History   Not on file  Tobacco Use   Smoking status: Former    Types: Cigarettes    Quit date: 06/05/1991    Years since quitting: 29.9   Smokeless tobacco: Never  Vaping Use   Vaping Use: Never used  Substance and Sexual Activity   Alcohol use: No   Drug use: No   Sexual activity: Not Currently  Other Topics Concern   Not on file  Social History Narrative   Working full time   Social Determinants of Radio broadcast assistant Strain: Low Risk    Difficulty of Paying Living Expenses: Not hard at all  Food Insecurity: Not on file  Transportation Needs: Not  on file  Physical Activity: Not on file  Stress: Not on file  Social Connections: Not on file  Intimate Partner Violence: Not on file    FAMILY HISTORY: Family History  Problem Relation Age of Onset   Heart disease Mother    Heart attack Mother    Arthritis Father    Diabetes Brother     ALLERGIES:  has No Known Allergies.  MEDICATIONS:  Current Outpatient Medications  Medication Sig Dispense Refill   amLODipine (NORVASC) 5 MG tablet Take 1 tablet by mouth once daily 90 tablet 0   diclofenac sodium (VOLTAREN) 1 % GEL Apply 2 g topically 4 (four) times daily. (Patient taking differently:  Apply 2 g topically as needed.) 100 g 2   docusate sodium (COLACE) 100 MG capsule Take 1 capsule (100 mg total) by mouth daily. 90 capsule 0   lidocaine-prilocaine (EMLA) cream Apply 1 application topically as needed. 30 g 3   losartan (COZAAR) 25 MG tablet Take 1 tablet by mouth once daily 90 tablet 0   Omega-3 Fatty Acids (FISH OIL) 1000 MG CAPS Take by mouth.     pembrolizumab (KEYTRUDA) 100 MG/4ML SOLN Inject 200 mg into the vein. Every 42 days,    Last txy 4/05, Next 5/03     polyethylene glycol (MIRALAX / GLYCOLAX) 17 g packet Take 17 g by mouth daily as needed for mild constipation. 14 each 0   rosuvastatin (CRESTOR) 40 MG tablet Take 1 tablet by mouth once daily 90 tablet 0   RYBELSUS 3 MG TABS Take 1 tablet by mouth once daily 90 tablet 1   No current facility-administered medications for this visit.   Facility-Administered Medications Ordered in Other Visits  Medication Dose Route Frequency Provider Last Rate Last Admin   heparin lock flush 100 UNIT/ML injection              PHYSICAL EXAMINATION: ECOG PERFORMANCE STATUS: 1 - Symptomatic but completely ambulatory Vitals:   05/16/21 0859  BP: (!) 158/71  Pulse: 80  Temp: (!) 97 F (36.1 C)   Filed Weights   05/16/21 0859  Weight: 213 lb (96.6 kg)    Physical Exam Constitutional:      General: She is not in acute distress.    Comments: Walk independently  HENT:     Head: Normocephalic and atraumatic.  Eyes:     General: No scleral icterus.    Pupils: Pupils are equal, round, and reactive to light.  Cardiovascular:     Rate and Rhythm: Normal rate and regular rhythm.     Heart sounds: Normal heart sounds.  Pulmonary:     Effort: Pulmonary effort is normal. No respiratory distress.     Breath sounds: No wheezing.  Abdominal:     General: Bowel sounds are normal. There is no distension.     Palpations: Abdomen is soft. There is no mass.     Comments: Ureterostomy,   Musculoskeletal:        General: No  deformity. Normal range of motion.     Cervical back: Normal range of motion and neck supple.  Skin:    General: Skin is warm and dry.     Coloration: Skin is not pale.     Findings: No erythema or rash.  Neurological:     Mental Status: She is alert and oriented to person, place, and time. Mental status is at baseline.     Cranial Nerves: No cranial nerve deficit.     Coordination:  Coordination normal.  Psychiatric:        Mood and Affect: Mood normal.  .  RADIOGRAPHIC STUDIES: I have personally reviewed the radiological images as listed and agreed with the findings in the report.  CMP Latest Ref Rng & Units 05/16/2021  Glucose 70 - 99 mg/dL 139(H)  BUN 8 - 23 mg/dL 26(H)  Creatinine 0.44 - 1.00 mg/dL 1.56(H)  Sodium 135 - 145 mmol/L 139  Potassium 3.5 - 5.1 mmol/L 4.3  Chloride 98 - 111 mmol/L 107  CO2 22 - 32 mmol/L 21(L)  Calcium 8.9 - 10.3 mg/dL 8.9  Total Protein 6.5 - 8.1 g/dL 7.4  Total Bilirubin 0.3 - 1.2 mg/dL 0.3  Alkaline Phos 38 - 126 U/L 98  AST 15 - 41 U/L 21  ALT 0 - 44 U/L 12   CBC Latest Ref Rng & Units 05/16/2021  WBC 4.0 - 10.5 K/uL 4.7  Hemoglobin 12.0 - 15.0 g/dL 9.8(L)  Hematocrit 36.0 - 46.0 % 31.7(L)  Platelets 150 - 400 K/uL 250    LABORATORY DATA:  I have reviewed the data as listed Lab Results  Component Value Date   WBC 4.7 05/16/2021   HGB 9.8 (L) 05/16/2021   HCT 31.7 (L) 05/16/2021   MCV 78.7 (L) 05/16/2021   PLT 250 05/16/2021   Recent Labs    03/21/21 0910 04/18/21 0840 05/16/21 0828  NA 139 138 139  K 3.7 4.3 4.3  CL 108 106 107  CO2 23 21* 21*  GLUCOSE 114* 155* 139*  BUN 25* 33* 26*  CREATININE 1.39* 1.72* 1.56*  CALCIUM 8.7* 9.2 8.9  GFRNONAA 41* 31* 35*  PROT 7.9 8.0 7.4  ALBUMIN 4.1 4.0 3.7  AST $Re'20 22 21  'WPb$ ALT $R'11 12 12  'qj$ ALKPHOS 109 126 98  BILITOT 0.2* 0.5 0.3    Iron/TIBC/Ferritin/ %Sat    Component Value Date/Time   IRON 62 01/24/2021 0838   IRON 26 (L) 02/04/2019 1309   TIBC 315 01/24/2021 0838    TIBC 251 02/04/2019 1309   FERRITIN 239 01/24/2021 0838   FERRITIN 409 (H) 02/04/2019 1309   IRONPCTSAT 20 01/24/2021 0838   IRONPCTSAT 10 (L) 02/04/2019 1309    RADIOGRAPHIC STUDIES: I have personally reviewed the radiological images as listed and agreed with the findings in the report. CT CHEST ABDOMEN PELVIS WO CONTRAST  Result Date: 04/10/2021 CLINICAL DATA:  Urothelial cancer, metastatic bladder cancer restaging, status post cystectomy and ileal conduit urinary diversion. EXAM: CT CHEST, ABDOMEN AND PELVIS WITHOUT CONTRAST TECHNIQUE: Multidetector CT imaging of the chest, abdomen and pelvis was performed following the standard protocol without IV contrast. Oral enteric processes menstrual. COMPARISON:  12/13/2020 FINDINGS: CT CHEST FINDINGS Cardiovascular: Right chest port catheter. Aortic atherosclerosis. Normal heart size. Three-vessel coronary artery calcifications. No pericardial effusion. Mediastinum/Nodes: No enlarged mediastinal, hilar, or axillary lymph nodes. Thyroid gland, trachea, and esophagus demonstrate no significant findings. Lungs/Pleura: Frothy debris in the right mainstem bronchus. Mild centrilobular emphysema. Diffuse bilateral bronchial wall thickening. No pleural effusion or pneumothorax. Musculoskeletal: No chest wall mass or suspicious bone lesions identified. CT ABDOMEN PELVIS FINDINGS Hepatobiliary: No solid liver abnormality is seen. No gallstones, gallbladder wall thickening, or biliary dilatation. Pancreas: Unremarkable. No pancreatic ductal dilatation or surrounding inflammatory changes. Spleen: Normal in size without significant abnormality. Adrenals/Urinary Tract: Adrenal glands are unremarkable. Kidneys are normal, without renal calculi, solid lesion, or hydronephrosis. Status post cystectomy right lower quadrant ileal conduit urinary diversion. Stomach/Bowel: Stomach is within normal limits. Status post ileal conduit  urinary diversion. No evidence of bowel wall  thickening, distention, or inflammatory changes. Vascular/Lymphatic: Aortic atherosclerosis. Right common external iliac vein stents. No enlarged abdominal or pelvic lymph nodes. Reproductive: Status post hysterectomy. Other: Right lower quadrant ileal conduit urinary diversion with parastomal hernia containing a single, nonobstructed loop of distal ileum. Small volume fluid in the pelvis, diminished compared to prior examination. Musculoskeletal: No acute or significant osseous findings. IMPRESSION: 1. Status post cystectomy and right lower quadrant ileal conduit urinary diversion. No hydronephrosis. 2. No noncontrast CT evidence of recurrent or metastatic disease in the chest, abdomen or pelvis. 3. Small volume fluid in the pelvis, diminished compared to prior examination. 4. Emphysema and diffuse bilateral bronchial wall thickening. 5. Coronary artery disease. Aortic Atherosclerosis (ICD10-I70.0) and Emphysema (ICD10-J43.9). Electronically Signed   By: Delanna Ahmadi M.D.   On: 04/10/2021 15:01      ASSESSMENT & PLAN:  1. Metastatic urothelial carcinoma ([place])   2. Encounter for antineoplastic immunotherapy   3. Anemia in stage 3b chronic kidney disease [facility])    Cancer Staging  Bladder carcinoma [facility]) Staging form: Urinary Bladder, AJCC 8th Edition - Clinical stage from 08/01/2018: Stage IVA (cTX, cN3, cM1a) - Signed by [name], MD on 04/21/2019  #Metastatic urothelial carcinoma of bladder, sarcomatoid features pelvic sidewall mass biopsy showed metastatic carcinoma consistent with involvement by urothelial carcinoma., Stage IV NED-  Labs are reviewed and discussed with patient.  Proceed with Keytruda today.  Chronic kidney disease, stable creatinine today.  Avoid nephrotoxin.  Encourage hydration. #Chronic anemia secondary to stage III CKD.  Hemoglobin 9.8 today.   Patient declines IV Venofer due to side effects.  Recommend patient to continue oral iron supplementation.  #Follow-up in 4 weeks  for repeat blood work, MD evaluation and Keytruda .  Patient prefers every 4 weeks treatment  .All questions were answered. The patient knows to call the clinic with any problems questions or concerns.  [name], MD, PhD 05/16/21

## 2021-05-21 ENCOUNTER — Other Ambulatory Visit: Payer: Self-pay

## 2021-06-02 DIAGNOSIS — H52223 Regular astigmatism, bilateral: Secondary | ICD-10-CM | POA: Diagnosis not present

## 2021-06-02 DIAGNOSIS — E119 Type 2 diabetes mellitus without complications: Secondary | ICD-10-CM | POA: Diagnosis not present

## 2021-06-02 DIAGNOSIS — H5203 Hypermetropia, bilateral: Secondary | ICD-10-CM | POA: Diagnosis not present

## 2021-06-02 DIAGNOSIS — Z7984 Long term (current) use of oral hypoglycemic drugs: Secondary | ICD-10-CM | POA: Diagnosis not present

## 2021-06-02 DIAGNOSIS — H524 Presbyopia: Secondary | ICD-10-CM | POA: Diagnosis not present

## 2021-06-02 DIAGNOSIS — H2513 Age-related nuclear cataract, bilateral: Secondary | ICD-10-CM | POA: Diagnosis not present

## 2021-06-02 LAB — HM DIABETES EYE EXAM

## 2021-06-08 ENCOUNTER — Telehealth: Payer: Self-pay

## 2021-06-08 NOTE — Chronic Care Management (AMB) (Signed)
Chronic Care Management Pharmacy Assistant   Name: Angela Russell  MRN: [id number] DOB: 10-Aug-1950  Reason for Encounter: Disease State Diabetes Mellitus   Recent office visits:  01/06/21-[name] Dad, NP (PCP) Seen for general follow up visit. Labs ordered. Follow up visit in 6 months.  Recent consult visits:  05/16/21-[name], MD (Oncology) Follow up visit for bladder cancer and anemia. Follow up in 4 weeks.  05/10/21-[name], MD (Gastroenterology) Follow up for iron deficiency anemia. Follow up in 3 months. 05/09/21-Harmeet Candiss Norse (Nephrology) Follow up visit. 04/18/21-[name], MD (Oncology) Follow up visit for bladder cancer and anemia. Proceed with Keytruda 200 mg today. Follow up in 4 weeks. 03/21/21-[name], MD (Oncology) Seen for follow up visit for bladder cancer and anemia. Proceed with Keytruda 200 mg today. Follow up in 4 weeks. 02/21/21-[name], MD (Oncology) Follow up visit on bladder cancer and anemia. Proceed with Keytruda 200 mg. Follow up in 4 weeks. 02/08/21-[name], MD (Gastroenterology) Follow up visit for constipation. Follow up in 3 months. 01/24/21-[name], MD (Oncology) Follow up visit for bladder cancer and anemia. Proceed with Keytruda 200 mg. Follow up in 4 weeks. 12/27/20-[name], MD (Oncology) Follow up visit for bladder cancer and anemia. Follow up in 4 weeks.  Hospital visits:  Medication Reconciliation was completed by comparing discharge summary, patients EMR and Pharmacy list, and upon discussion with patient.  Admitted to the hospital on 01/29/21 due to Hematuria. Discharge date was 01/29/21. Discharged from [facility]?Medications Started at [facility] Discharge:?? -started Keflex 500 mg three x daily  Medication Changes at Hospital Discharge: -Changed None noted  Medications Discontinued at Hospital Discharge: -Stopped None noted  Medications that remain the same after Hospital Discharge:??  -All other  medications will remain the same.     Medication Reconciliation was completed by comparing discharge summary, patients EMR and Pharmacy list, and upon discussion with patient.  Admitted to the hospital on 12/13/20 due to Abdominal pain. Discharge date was 12/13/20. Discharged from [place]?Medications Started at [facility] Discharge:?? -started None noted  Medication Changes at Hospital Discharge: -Changed None noted  Medications Discontinued at Hospital Discharge: -Stopped None noted  Medications that remain the same after Hospital Discharge:??  -All other medications will remain the same.   Medications: Outpatient Encounter Medications as of 06/08/2021  Medication Sig   amLODipine (NORVASC) 5 MG tablet Take 1 tablet by mouth once daily   diclofenac sodium (VOLTAREN) 1 % GEL Apply 2 g topically 4 (four) times daily. (Patient taking differently: Apply 2 g topically as needed.)   docusate sodium (COLACE) 100 MG capsule Take 1 capsule (100 mg total) by mouth daily.   lidocaine-prilocaine (EMLA) cream Apply 1 application topically as needed.   losartan (COZAAR) 25 MG tablet Take 1 tablet by mouth once daily   Omega-3 Fatty Acids (FISH OIL) 1000 MG CAPS Take by mouth.   pembrolizumab (KEYTRUDA) 100 MG/4ML SOLN Inject 200 mg into the vein. Every 42 days,    Last txy 4/05, Next 5/03   polyethylene glycol (MIRALAX / GLYCOLAX) 17 g packet Take 17 g by mouth daily as needed for mild constipation.   rosuvastatin (CRESTOR) 40 MG tablet Take 1 tablet by mouth once daily   RYBELSUS 3 MG TABS Take 1 tablet by mouth once daily   [DISCONTINUED] prochlorperazine (COMPAZINE) 10 MG tablet Take 1 tablet (10 mg total) by mouth every 6 (six) hours as needed (Nausea or vomiting).  Facility-Administered Encounter Medications as of 06/08/2021  Medication   heparin lock flush 100 UNIT/ML injection   Current antihyperglycemic regimen:  Rybelsus 3 mg take 1 tab  daily   Unsuccessful attempts to complete assessment call. I have left 3 voicemail's for the patient to return phone call.   Adherence Review: Is the patient currently on a STATIN medication? Yes Is the patient currently on ACE/ARB medication? Yes Does the patient have >5 day gap between last estimated fill dates? No   Care Gaps: TETANUS/TDAP:Never done Zoster Vaccines- Shingrix:Never done DEXA SCAN:Never done Pneumonia Vaccine:Last completed: May 04, 2017 COVID-19 Vaccine:Last completed: May 20, 2020 INFLUENZA VACCINE:Last completed: Mar 11, 2020 FOOT EXAM:Last completed: Mar 11, 2020  Star Rating Drugs: Rosuvastatin 40 mg Last filled:05/25/21 90 DS Losartan 25 mg Last filled:05/25/21 90 DS Rybelsus 3 mg Last filled:05/25/21 90 DS  Myriam Elta [place], [place]

## 2021-06-13 ENCOUNTER — Encounter: Payer: Self-pay | Admitting: Oncology

## 2021-06-13 ENCOUNTER — Other Ambulatory Visit: Payer: Self-pay

## 2021-06-13 ENCOUNTER — Inpatient Hospital Stay (HOSPITAL_BASED_OUTPATIENT_CLINIC_OR_DEPARTMENT_OTHER): Payer: Medicare Other | Admitting: Oncology

## 2021-06-13 ENCOUNTER — Inpatient Hospital Stay: Payer: Medicare Other

## 2021-06-13 ENCOUNTER — Inpatient Hospital Stay: Payer: Medicare Other | Attending: Oncology

## 2021-06-13 VITALS — BP 170/89 | HR 90 | Temp 96.1°F | Resp 18 | Wt 213.4 lb

## 2021-06-13 DIAGNOSIS — N1832 Chronic kidney disease, stage 3b: Secondary | ICD-10-CM | POA: Diagnosis not present

## 2021-06-13 DIAGNOSIS — D631 Anemia in chronic kidney disease: Secondary | ICD-10-CM | POA: Insufficient documentation

## 2021-06-13 DIAGNOSIS — Z87891 Personal history of nicotine dependence: Secondary | ICD-10-CM | POA: Diagnosis not present

## 2021-06-13 DIAGNOSIS — Z79899 Other long term (current) drug therapy: Secondary | ICD-10-CM | POA: Diagnosis not present

## 2021-06-13 DIAGNOSIS — C7910 Secondary malignant neoplasm of unspecified urinary organs: Secondary | ICD-10-CM

## 2021-06-13 DIAGNOSIS — Z86718 Personal history of other venous thrombosis and embolism: Secondary | ICD-10-CM | POA: Insufficient documentation

## 2021-06-13 DIAGNOSIS — Z5112 Encounter for antineoplastic immunotherapy: Secondary | ICD-10-CM | POA: Diagnosis not present

## 2021-06-13 DIAGNOSIS — C679 Malignant neoplasm of bladder, unspecified: Secondary | ICD-10-CM | POA: Diagnosis not present

## 2021-06-13 DIAGNOSIS — R635 Abnormal weight gain: Secondary | ICD-10-CM

## 2021-06-13 LAB — CBC WITH DIFFERENTIAL/PLATELET
Abs Immature Granulocytes: 0.01 10*3/uL (ref 0.00–0.07)
Basophils Absolute: 0 10*3/uL (ref 0.0–0.1)
Basophils Relative: 1 %
Eosinophils Absolute: 0.2 10*3/uL (ref 0.0–0.5)
Eosinophils Relative: 3 %
HCT: 35 % — ABNORMAL LOW (ref 36.0–46.0)
Hemoglobin: 10.6 g/dL — ABNORMAL LOW (ref 12.0–15.0)
Immature Granulocytes: 0 %
Lymphocytes Relative: 26 %
Lymphs Abs: 1.5 10*3/uL (ref 0.7–4.0)
MCH: 24.2 pg — ABNORMAL LOW (ref 26.0–34.0)
MCHC: 30.3 g/dL (ref 30.0–36.0)
MCV: 79.9 fL — ABNORMAL LOW (ref 80.0–100.0)
Monocytes Absolute: 0.4 10*3/uL (ref 0.1–1.0)
Monocytes Relative: 6 %
Neutro Abs: 3.8 10*3/uL (ref 1.7–7.7)
Neutrophils Relative %: 64 %
Platelets: 252 10*3/uL (ref 150–400)
RBC: 4.38 MIL/uL (ref 3.87–5.11)
RDW: 13.8 % (ref 11.5–15.5)
WBC: 5.9 10*3/uL (ref 4.0–10.5)
nRBC: 0 % (ref 0.0–0.2)

## 2021-06-13 LAB — COMPREHENSIVE METABOLIC PANEL
ALT: 11 U/L (ref 0–44)
AST: 21 U/L (ref 15–41)
Albumin: 3.9 g/dL (ref 3.5–5.0)
Alkaline Phosphatase: 114 U/L (ref 38–126)
Anion gap: 8 (ref 5–15)
BUN: 26 mg/dL — ABNORMAL HIGH (ref 8–23)
CO2: 22 mmol/L (ref 22–32)
Calcium: 9 mg/dL (ref 8.9–10.3)
Chloride: 107 mmol/L (ref 98–111)
Creatinine, Ser: 1.51 mg/dL — ABNORMAL HIGH (ref 0.44–1.00)
GFR, Estimated: 37 mL/min — ABNORMAL LOW (ref >60)
Glucose, Bld: 135 mg/dL — ABNORMAL HIGH (ref 70–99)
Potassium: 3.7 mmol/L (ref 3.5–5.1)
Sodium: 137 mmol/L (ref 135–145)
Total Bilirubin: 0.1 mg/dL — ABNORMAL LOW (ref 0.3–1.2)
Total Protein: 7.8 g/dL (ref 6.5–8.1)

## 2021-06-13 LAB — TSH: TSH: 1.959 u[IU]/mL (ref 0.350–4.500)

## 2021-06-13 MED ORDER — SODIUM CHLORIDE 0.9 % IV SOLN
200.0000 mg | Freq: Once | INTRAVENOUS | Status: AC
  Administered 2021-06-13: 200 mg via INTRAVENOUS
  Filled 2021-06-13: qty 8

## 2021-06-13 MED ORDER — SODIUM CHLORIDE 0.9 % IV SOLN
Freq: Once | INTRAVENOUS | Status: AC
  Filled 2021-06-13: qty 250

## 2021-06-13 MED ORDER — SODIUM CHLORIDE 0.9% FLUSH
10.0000 mL | Freq: Once | INTRAVENOUS | Status: AC
  Administered 2021-06-13: 10 mL via INTRAVENOUS
  Filled 2021-06-13: qty 10

## 2021-06-13 MED ORDER — HEPARIN SOD (PORK) LOCK FLUSH 100 UNIT/ML IV SOLN
INTRAVENOUS | Status: AC
  Administered 2021-06-13: 500 [IU]
  Filled 2021-06-13: qty 5

## 2021-06-13 MED ORDER — HEPARIN SOD (PORK) LOCK FLUSH 100 UNIT/ML IV SOLN
500.0000 [IU] | Freq: Once | INTRAVENOUS | Status: AC | PRN
  Filled 2021-06-13: qty 5

## 2021-06-13 NOTE — Patient Instructions (Signed)
[facility] [place]  Discharge Instructions: Thank you for choosing [facility] to provide your oncology and hematology care.  If you have a lab appointment with the [facility], please go directly to the [facility] and check in at the registration area.  Wear comfortable clothing and clothing appropriate for easy access to any Portacath or PICC line.   We strive to give you quality time with your provider. You may need to reschedule your appointment if you arrive late (15 or more minutes).  Arriving late affects you and other patients whose appointments are after yours.  Also, if you miss three or more appointments without notifying the office, you may be dismissed from the clinic at the providers discretion.      For prescription refill requests, have your pharmacy contact our office and allow 72 hours for refills to be completed.    Today you received the following chemotherapy and/or immunotherapy agents, Keytruda     To help prevent nausea and vomiting after your treatment, we encourage you to take your nausea medication as directed.  BELOW ARE SYMPTOMS THAT SHOULD BE REPORTED IMMEDIATELY: *FEVER GREATER THAN 100.4 F (38 C) OR HIGHER *CHILLS OR SWEATING *NAUSEA AND VOMITING THAT IS NOT CONTROLLED WITH YOUR NAUSEA MEDICATION *UNUSUAL SHORTNESS OF BREATH *UNUSUAL BRUISING OR BLEEDING *URINARY PROBLEMS (pain or burning when urinating, or frequent urination) *BOWEL PROBLEMS (unusual diarrhea, constipation, pain near the anus) TENDERNESS IN MOUTH AND THROAT WITH OR WITHOUT PRESENCE OF ULCERS (sore throat, sores in mouth, or a toothache) UNUSUAL RASH, SWELLING OR PAIN  UNUSUAL VAGINAL DISCHARGE OR ITCHING   Items with * indicate a potential emergency and should be followed up as soon as possible or go to the Emergency Department if any problems should occur.  Please show the CHEMOTHERAPY ALERT CARD or IMMUNOTHERAPY ALERT CARD at check-in to  the Emergency Department and triage nurse.  Should you have questions after your visit or need to cancel or reschedule your appointment, please contact [facility] [place] [facility] [place]  [phone] and follow the prompts.  Office hours are 8:00 a.m. to 4:30 p.m. Monday - Friday. Please note that voicemails left after 4:00 p.m. may not be returned until the following business day.  We are closed weekends and major holidays. You have access to a nurse at all times for urgent questions. Please call the main number to the clinic [phone] and follow the prompts.  For any non-urgent questions, you may also contact your provider using MyChart. We now offer e-Visits for anyone 50 and older to request care online for non-urgent symptoms. For details visit [email or web address].   Also download the MyChart app! Go to the app store, search "MyChart", open the app, select [facility], and log in with your MyChart username and password.  Due to Covid, a mask is required upon entering the hospital/clinic. If you do not have a mask, one will be given to you upon arrival. For doctor visits, patients may have 1 support person aged 47 or older with them. For treatment visits, patients cannot have anyone with them due to current Covid guidelines and our immunocompromised population.

## 2021-06-13 NOTE — Progress Notes (Signed)
Pt here to establish care. No new concerns voiced.

## 2021-06-13 NOTE — Progress Notes (Signed)
°Hematology/Oncology follow up note °[phone] [phone] ° ° °Patient Care Team: °Holdsworth, Karen, NP as PCP - General °Nice, Keith B, OD (Optometry) °, , MD as Consulting Physician (Hematology and Oncology) °Harris, Julie S, RPH (Inactive) (Pharmacist) ° ° °CHIEF COMPLAINTS/REASON FOR VISIT:  °Follow up for bladder cancer, anemia.  ° °HISTORY OF PRESENTING ILLNESS:  °Angela Russell is a  71 y.o.  female with PMH listed below who was referred to me for evaluation of newly diagnosed bladder cancer. °Patient initially presented to emergency room at the end of January 2020 for evaluation of dysuria, hematuria and the left lower quadrant inguinal pain and flank pain.  1/28 2020 CT renal stone study showed suspected irregular wall thickening about the superior bladder, not well assessed due to degree of bladder distention.  Recommend cystoscopy for further evaluation.  No renal stone or obstructive uropathy.  Patient was given IV Rocephin and referred patient for outpatient urology follow-up.  Urine culture was negative.  She again presented to ER after 2 days with similar symptoms.  °Patient has 25-pack-year smoking history, quit approximately 20 years ago.  No family history of any urology malignancies °07/03/2018 another CT abdomen pelvis with contrast was done which showed no nephrolithiasis or hydronephrosis is identified.  Bladder is decompressed limiting evaluation. ° 07/16/2018.urology Dr. Sinskey - cystoscopy and bilateral retrograde pyelogram on 07/16/2018.  Pyelogram did not show any filling defect or abnormalities.  No hydronephrosis.  Ureteral orifice was not involved with tumor.  There is a large 5 cm posterior wall bladder tumor, bullous and sessile appearing.  Patient underwent TURBT.  ° °Pathology: High-grade urothelial carcinoma, invasive into muscularis propria.  Lymphovascular invasion is present.  Carcinoma in situ is also identified.  Focal squamous differentiation is  noted, areas of invasive carcinoma display pleomorphic/sarcomatoid changes.  T2b ° °08/07/2018 CT without contrast negative.  2 subpleural right upper lobe nodule 2 to 3 mm likely benign. °She also had baseline audiometry done.  °08/04/2018 ddMVAC x 1 cycle, stopped due to intolerance and AKI. ° °Patient received 1 cycle of dd MVAC, not able to tolerate due to AKI. °Patient then was referred to [facility]  °09/22/2018 patient underwent a cystectomy, pathology pT3a N0 Mx.  11 lymph nodes were harvested and was all negative. °Invasive urothelia carcinoma, high grade, with sarcomatoid features.  ° °10/14/2018 patient was admitted due to pyelonephritis and a pelvic fluid collection.  Drain was placed. °10/23/2018 drain was removed. °Patient has had difficulties getting to her appointments to [facility]. °02/09/2019, patient presented with abdominal pain. °CT concerning for small bowel obstruction and increased size of right pelvic fluid collection concerning for cancer recurrence.  Right hydronephrosis to the level of pelvis and enlarged lymph nodes. °Patient had JP drain placed with CT guidance to pelvic fluid collection and drained 400 cc amber fluid.  Culture was negative for growth of microorganisms and cytology was negative for malignancy.-JP drain was removed on the day of discharge. °CT-guided core biopsy of pelvic lymph node adenopathy was attempted but not successful. ° °# Right lower extremity DVT, provoked by Transvenous biopsy  °02/26/2019 transvenous biopsy by IR unsuccessful transvenous biopsy of the right pelvis mass. °Post biopsy acute thrombus in the right external iliac and common femoral vein.  Patient was recommended to start anticoagulation with Xarelto however due to the co-pay, patient is not able to afford the medication. °Anticoagulation regimen was switched to Eliquis 5 mg.  Patient reports that she has been taking it once a   a day. 03/20/2019 PET showed FDG avid tissue in the cystectomy bed,  retroperitoneal and pelvic lymphadenopathy.  Right common iliac DVT.  # increased right lower extremity swelling.  She was started on Eliquis for anticoagulation by [facility].  We clarified with her pharmacy and she was actually taking Eliquis 2.5 mg twice daily. Right lower extremity swelling has not improved but instead worsened. 03/16/2019 She had ultrasound right lower extremity done which showed persistent extensive proximal right lower extremity DVT.  Anticoagulation regimen has increased to Eliquis 5 mg twice daily.  # establish care with [facility] oncology Dr. [name] for evaluation.  Dr. [name] recommended starting immunotherapy with PD-L1 inhibitor Pembrolizumab 200 mg every 3 weeks.  The sarcomatoid histology may not respond to chemotherapy well, could portend a better chance of response into immunotherapy PET scan after 4 cycles of Keytruda showed single mildly enlarged central mesenteric lymph node in the upper pelvis with SUV 9.6.  No other abnormal hypermetabolic activity was reported.  # #Right lower extremity  femoral vein DVT provoked by transvenous biopsy in the context of cancer recurrence, Status post IV C filter and mechanical thrombectomy.  IVC filter has been removed. She is currently off anticoagulation after she developed hematuria.  # 03/30/2019 Status post IVC filter placement, mechanical thrombectomy.-IVC filter was retrieved in January 2021. # PD-L1 CPS 100%.  # 03/31/2019 started on immunotherapy Keytruda.  # 04/26/2020 CT chest abdomen pelvis was reviewed and discussed with patient. No definitive finding of disease recurrence or metastasis.  Small amount of ascites similar to prior. There is a parastomal hernia containing a small margin of adjacent small bowel.  This is associated with wall thickening in approximately 7 cm segment of bowel adjacent to the hernia.  There is adjacent abnormal stranding of the mesentery and omentum.  # 08/19/2020, CT chest abdomen pelvis  showed no evidence of new/progressive metastatic disease. NED Hold off treatment due to uncontrolled hypotension.  # Chronic abdominal discomfort due to hernia. 08/11/2020, patient was seen by Dr. Dolphus Jenny for parastomal hernia.  Patient has no obstructive symptoms and would like to continue to monitor her symptoms and hold off any intervention at this point.  Patient was recommended to wear ostomy hernia belt and was referred to [facility] wound ostomy nursing team.  04/10/2021 CT chest abdomen pelvis without contrast showed no evidence of disease recurrence.  Small volume fluid in the pelvis, decreased.  Emphysema/diffuse bilateral bronchial wall thickening.  CAD.  She received IV venofer previously and did not tolerate due to energized joint pain afterwards.  #Hernia -parastomal hernia.   She has establish care with [facility] hernia center.  She was recommended to monitor her symptoms and aware ostomy belt  INTERVAL HISTORY Angela Russell is a 71 y.o. female who has above history reviewed by me today presents for follow up visit for evaluation form metastatic high-grade urothelial carcinoma of the bladder. Sarcomatoid feature.  Patient has been on maintenance immunotherapy.   She is currently on Keytruda every 4 weeks per patient's request. She tolerates well.  Denies any side effects. She has not taking her blood pressure medication recently. Denies any pain. Review of Systems  Constitutional:  Negative for appetite change, chills, fatigue and fever.  HENT:   Negative for hearing loss and voice change.   Eyes:  Negative for eye problems.  Respiratory:  Negative for chest tightness and cough.   Cardiovascular:  Negative for chest pain and leg swelling.  Gastrointestinal:  Negative for abdominal distention, abdominal pain,  blood in stool and constipation.  Endocrine: Negative for hot flashes.  Genitourinary:  Negative for difficulty urinating and frequency.   Musculoskeletal:  Positive for arthralgias.   Skin:  Negative for itching and rash.  Neurological:  Positive for numbness. Negative for extremity weakness.  Hematological:  Negative for adenopathy.  Psychiatric/Behavioral:  Negative for confusion. The patient is not nervous/anxious.    MEDICAL HISTORY:  Past Medical History:  Diagnosis Date   Carotid artery plaque, right 01/2014   CKD (chronic kidney disease)    stage 2-3   Controlled diabetes mellitus type 2 with complications (HCC)    Diabetes mellitus without complication (HCC)    DM (diabetes mellitus), type 2, uncontrolled    Hyperlipidemia    Hypertension    Hypochromic microcytic anemia    mild   Metastatic urothelial carcinoma ([place]) 03/23/2019   Osteoporosis    Personal history of colonic polyps    Renal insufficiency    Rotator cuff tendonitis, right     SURGICAL HISTORY: Past Surgical History:  Procedure Laterality Date   ABDOMINAL HYSTERECTOMY  2000   due to bleeding and fibroids, partial- still has ovaries   COLONOSCOPY WITH PROPOFOL N/A 03/17/2021   Procedure: COLONOSCOPY WITH PROPOFOL;  Surgeon: Jonathon Bellows, MD;  Location[facility] ENDOSCOPY;  Service: Gastroenterology;  Laterality: N/A;   CYSTOSCOPY W/ RETROGRADES Bilateral 07/16/2018   Procedure: CYSTOSCOPY WITH RETROGRADE PYELOGRAM;  Surgeon: Billey Co, MD;  Location: [facility] ORS;  Service: Urology;  Laterality: Bilateral;   ESOPHAGOGASTRODUODENOSCOPY  03/17/2021   Procedure: ESOPHAGOGASTRODUODENOSCOPY (EGD);  Surgeon: Jonathon Bellows, MD;  Location: [facility] ENDOSCOPY;  Service: Gastroenterology;;   IVC FILTER INSERTION N/A 03/30/2019   Procedure: IVC FILTER INSERTION;  Surgeon: Algernon Huxley, MD;  Location: [place] CV LAB;  Service: Cardiovascular;  Laterality: N/A;   IVC FILTER REMOVAL N/A 06/15/2019   Procedure: IVC FILTER REMOVAL;  Surgeon: Algernon Huxley, MD;  Location: [place] [facility] LAB;  Service: Cardiovascular;  Laterality: N/A;   KNEE SURGERY Left 03/17/2013   torn meniscus   PERIPHERAL VASCULAR  THROMBECTOMY Right 03/30/2019   Procedure: PERIPHERAL VASCULAR THROMBECTOMY;  Surgeon: Algernon Huxley, MD;  Location: [place] [facility] LAB;  Service: Cardiovascular;  Laterality: Right;   PORTA CATH INSERTION N/A 08/06/2018   Procedure: PORTA CATH INSERTION;  Surgeon: Algernon Huxley, MD;  Location: [place] [facility] LAB;  Service: Cardiovascular;  Laterality: N/A;   TRANSURETHRAL RESECTION OF BLADDER TUMOR N/A 07/16/2018   Procedure: TRANSURETHRAL RESECTION OF BLADDER TUMOR (TURBT);  Surgeon: Billey Co, MD;  Location: [facility] ORS;  Service: Urology;  Laterality: N/A;    SOCIAL HISTORY: Social History   Socioeconomic History   Marital status: Single    Spouse name: Not on file   Number of children: 1   Years of education: Not on file   Highest education level: 10th grade  Occupational History   Not on file  Tobacco Use   Smoking status: Former    Types: Cigarettes    Quit date: 06/05/1991    Years since quitting: 30.0   Smokeless tobacco: Never  Vaping Use   Vaping Use: Never used  Substance and Sexual Activity   Alcohol use: No   Drug use: No   Sexual activity: Not Currently  Other Topics Concern   Not on file  Social History Narrative   Working full time   Social Determinants of Health   Financial Resource Strain: Low Risk    Difficulty of Paying Living Expenses:  Not hard at all  Food Insecurity: Not on file  Transportation Needs: Not on file  Physical Activity: Not on file  Stress: Not on file  Social Connections: Not on file  Intimate Partner Violence: Not on file    FAMILY HISTORY: Family History  Problem Relation Age of Onset   Heart disease Mother    Heart attack Mother    Arthritis Father    Diabetes Brother     ALLERGIES:  has No Known Allergies.  MEDICATIONS:  Current Outpatient Medications  Medication Sig Dispense Refill   amLODipine (NORVASC) 5 MG tablet Take 1 tablet by mouth once daily 90 tablet 0   diclofenac sodium (VOLTAREN) 1 % GEL Apply 2 g  topically 4 (four) times daily. (Patient taking differently: Apply 2 g topically as needed.) 100 g 2   docusate sodium (COLACE) 100 MG capsule Take 1 capsule (100 mg total) by mouth daily. 90 capsule 0   lidocaine-prilocaine (EMLA) cream Apply 1 application topically as needed. 30 g 3   losartan (COZAAR) 25 MG tablet Take 1 tablet by mouth once daily 90 tablet 0   Omega-3 Fatty Acids (FISH OIL) 1000 MG CAPS Take by mouth.     pembrolizumab (KEYTRUDA) 100 MG/4ML SOLN Inject 200 mg into the vein. Every 42 days,    Last txy 4/05, Next 5/03     polyethylene glycol (MIRALAX / GLYCOLAX) 17 g packet Take 17 g by mouth daily as needed for mild constipation. 14 each 0   RYBELSUS 3 MG TABS Take 1 tablet by mouth once daily 90 tablet 1   No current facility-administered medications for this visit.   Facility-Administered Medications Ordered in Other Visits  Medication Dose Route Frequency Provider Last Rate Last Admin   heparin lock flush 100 UNIT/ML injection              PHYSICAL EXAMINATION: ECOG PERFORMANCE STATUS: 1 - Symptomatic but completely ambulatory Vitals:   06/13/21 0859  BP: (!) 170/89  Pulse: 90  Resp: 18  Temp: (!) 96.1 F (35.6 C)   Filed Weights   06/13/21 0859  Weight: 213 lb 6.4 oz (96.8 kg)    Physical Exam Constitutional:      General: She is not in acute distress.    Comments: Walk independently  HENT:     Head: Normocephalic and atraumatic.  Eyes:     General: No scleral icterus.    Pupils: Pupils are equal, round, and reactive to light.  Cardiovascular:     Rate and Rhythm: Normal rate and regular rhythm.     Heart sounds: Normal heart sounds.  Pulmonary:     Effort: Pulmonary effort is normal. No respiratory distress.     Breath sounds: No wheezing.  Abdominal:     General: Bowel sounds are normal. There is no distension.     Palpations: Abdomen is soft. There is no mass.     Comments: Ureterostomy,   Musculoskeletal:        General: No deformity.  Normal range of motion.     Cervical back: Normal range of motion and neck supple.  Skin:    General: Skin is warm and dry.     Coloration: Skin is not pale.     Findings: No erythema or rash.  Neurological:     Mental Status: She is alert and oriented to person, place, and time. Mental status is at baseline.     Cranial Nerves: No cranial nerve deficit.  Coordination: Coordination normal.  Psychiatric:        Mood and Affect: Mood normal.  .  RADIOGRAPHIC STUDIES: I have personally reviewed the radiological images as listed and agreed with the findings in the report.  CMP Latest Ref Rng & Units 06/13/2021  Glucose 70 - 99 mg/dL 135(H)  BUN 8 - 23 mg/dL 26(H)  Creatinine 0.44 - 1.00 mg/dL 1.51(H)  Sodium 135 - 145 mmol/L 137  Potassium 3.5 - 5.1 mmol/L 3.7  Chloride 98 - 111 mmol/L 107  CO2 22 - 32 mmol/L 22  Calcium 8.9 - 10.3 mg/dL 9.0  Total Protein 6.5 - 8.1 g/dL 7.8  Total Bilirubin 0.3 - 1.2 mg/dL 0.1(L)  Alkaline Phos 38 - 126 U/L 114  AST 15 - 41 U/L 21  ALT 0 - 44 U/L 11   CBC Latest Ref Rng & Units 06/13/2021  WBC 4.0 - 10.5 K/uL 5.9  Hemoglobin 12.0 - 15.0 g/dL 10.6(L)  Hematocrit 36.0 - 46.0 % 35.0(L)  Platelets 150 - 400 K/uL 252    LABORATORY DATA:  I have reviewed the data as listed Lab Results  Component Value Date   WBC 5.9 06/13/2021   HGB 10.6 (L) 06/13/2021   HCT 35.0 (L) 06/13/2021   MCV 79.9 (L) 06/13/2021   PLT 252 06/13/2021   Recent Labs    04/18/21 0840 05/16/21 0828 06/13/21 0844  NA 138 139 137  K 4.3 4.3 3.7  CL 106 107 107  CO2 21* 21* 22  GLUCOSE 155* 139* 135*  BUN 33* 26* 26*  CREATININE 1.72* 1.56* 1.51*  CALCIUM 9.2 8.9 9.0  GFRNONAA 31* 35* 37*  PROT 8.0 7.4 7.8  ALBUMIN 4.0 3.7 3.9  AST _0 ALT _1 ALKPHOS 126 98 114  BILITOT 0.5 0.3 0.1*    Iron/TIBC/Ferritin/ %Sat    Component Value Date/Time   IRON 62 01/24/2021 0838   IRON 26 (L) 02/04/2019 1309   TIBC 315 01/24/2021 0838   TIBC 251  02/04/2019 1309   FERRITIN 239 01/24/2021 0838   FERRITIN 409 (H) 02/04/2019 1309   IRONPCTSAT 20 01/24/2021 0838   IRONPCTSAT 10 (L) 02/04/2019 1309    RADIOGRAPHIC STUDIES: I have personally reviewed the radiological images as listed and agreed with the findings in the report. CT CHEST ABDOMEN PELVIS WO CONTRAST  Result Date: 04/10/2021 CLINICAL DATA:  Urothelial cancer, metastatic bladder cancer restaging, status post cystectomy and ileal conduit urinary diversion. EXAM: CT CHEST, ABDOMEN AND PELVIS WITHOUT CONTRAST TECHNIQUE: Multidetector CT imaging of the chest, abdomen and pelvis was performed following the standard protocol without IV contrast. Oral enteric processes menstrual. COMPARISON:  12/13/2020 FINDINGS: CT CHEST FINDINGS Cardiovascular: Right chest port catheter. Aortic atherosclerosis. Normal heart size. Three-vessel coronary artery calcifications. No pericardial effusion. Mediastinum/Nodes: No enlarged mediastinal, hilar, or axillary lymph nodes. Thyroid gland, trachea, and esophagus demonstrate no significant findings. Lungs/Pleura: Frothy debris in the right mainstem bronchus. Mild centrilobular emphysema. Diffuse bilateral bronchial wall thickening. No pleural effusion or pneumothorax. Musculoskeletal: No chest wall mass or suspicious bone lesions identified. CT ABDOMEN PELVIS FINDINGS Hepatobiliary: No solid liver abnormality is seen. No gallstones, gallbladder wall thickening, or biliary dilatation. Pancreas: Unremarkable. No pancreatic ductal dilatation or surrounding inflammatory changes. Spleen: Normal in size without significant abnormality. Adrenals/Urinary Tract: Adrenal glands are unremarkable. Kidneys are normal, without renal calculi, solid lesion, or hydronephrosis. Status post cystectomy right lower quadrant ileal conduit urinary diversion. Stomach/Bowel: Stomach is within normal limits. Status post ileal  conduit urinary diversion. No evidence of bowel wall thickening,  distention, or inflammatory changes. Vascular/Lymphatic: Aortic atherosclerosis. Right common external iliac vein stents. No enlarged abdominal or pelvic lymph nodes. Reproductive: Status post hysterectomy. Other: Right lower quadrant ileal conduit urinary diversion with parastomal hernia containing a single, nonobstructed loop of distal ileum. Small volume fluid in the pelvis, diminished compared to prior examination. Musculoskeletal: No acute or significant osseous findings. IMPRESSION: 1. Status post cystectomy and right lower quadrant ileal conduit urinary diversion. No hydronephrosis. 2. No noncontrast CT evidence of recurrent or metastatic disease in the chest, abdomen or pelvis. 3. Small volume fluid in the pelvis, diminished compared to prior examination. 4. Emphysema and diffuse bilateral bronchial wall thickening. 5. Coronary artery disease. Aortic Atherosclerosis (ICD10-I70.0) and Emphysema (ICD10-J43.9). Electronically Signed   By: Delanna Ahmadi M.D.   On: 04/10/2021 15:01      ASSESSMENT & PLAN:  1. Metastatic urothelial carcinoma ([place])   2. Encounter for antineoplastic immunotherapy   3. Anemia in stage 3b chronic kidney disease (HCC)   4. Stage 3b chronic kidney disease ([place])    Cancer Staging  Bladder carcinoma [facility]) Staging form: Urinary Bladder, AJCC 8th Edition - Clinical stage from 08/01/2018: Stage IVA (cTX, cN3, cM1a) - Signed by [name], MD on 04/21/2019  #Metastatic urothelial carcinoma of bladder, sarcomatoid features pelvic sidewall mass biopsy showed metastatic carcinoma consistent with involvement by urothelial carcinoma., Stage IV NED-  Labs reviewed and discussed with patient. Proceed with Keytruda today.  Plan to repeat CT scan in late February/early March 2023.  Chronic kidney disease, stable creatinine today.  Avoid nephrotoxin.  Encourage hydration. #Chronic anemia secondary to stage III CKD.  Hemoglobin 10.6 today.  Continue oral iron supplementation. Patient  declines IV Venofer due to side effects.    #Follow-up in 4 weeks for repeat blood work, MD evaluation and Keytruda .  Patient prefers every 4 weeks treatment  .All questions were answered. The patient knows to call the clinic with any problems questions or concerns.  [name], MD, PhD 06/13/21

## 2021-06-19 ENCOUNTER — Ambulatory Visit
Admission: RE | Admit: 2021-06-19 | Discharge: 2021-06-19 | Disposition: A | Discharge status: Home / Self Care | Payer: Medicare Other | Attending: Gastroenterology | Admitting: Gastroenterology

## 2021-06-19 ENCOUNTER — Encounter
Admission: RE | Disposition: A | Discharge status: Home / Self Care | Payer: Self-pay | Source: Home / Self Care | Attending: Gastroenterology

## 2021-06-19 DIAGNOSIS — D509 Iron deficiency anemia, unspecified: Secondary | ICD-10-CM | POA: Insufficient documentation

## 2021-06-19 HISTORY — PX: GIVENS CAPSULE STUDY: SHX5432

## 2021-06-19 SURGERY — IMAGING PROCEDURE, GI TRACT, INTRALUMINAL, VIA CAPSULE

## 2021-06-20 ENCOUNTER — Encounter: Payer: Self-pay | Admitting: Gastroenterology

## 2021-06-21 ENCOUNTER — Telehealth: Payer: Self-pay | Admitting: Nurse Practitioner

## 2021-06-21 NOTE — Telephone Encounter (Signed)
Copied from [place] [id number]. Topic: Medicare AWV >> Jun 21, 2021  3:28 PM Lavonia Drafts wrote: Reason for CRM:  Left message for patient to call back and schedule the Medicare Annual Wellness Visit (AWV) virtually or by telephone.  Last AWV 06/22/19  Please schedule at anytime with [facility] Health Advisor.  45 minute appointment  Any questions, please call me at [phone]

## 2021-07-07 ENCOUNTER — Telehealth: Payer: Self-pay

## 2021-07-07 NOTE — Chronic Care Management (AMB) (Signed)
Chronic Care Management Pharmacy Assistant   Name: Angela Russell  MRN: [id number] DOB: 1950-08-15  Reason for Encounter: Disease State Diabetes Mellitus   Recent office visits:  01/06/21-[name] Dad, NP (PCP) Seen for general follow up visit. Labs ordered. Follow up visit in 6 months  Recent consult visits:  06/13/21-[name], MD (Oncology) Follow up for bladder cancer, anemia. Follow up in 4 weeks.  05/16/21-[name], MD (Oncology) Follow up visit for bladder cancer and anemia. Follow up in 4 weeks.  05/10/21-[name], MD (Gastroenterology) Follow up for iron deficiency anemia. Follow up in 3 months. 05/09/21-Harmeet Candiss Norse (Nephrology) Follow up visit. 04/18/21-[name], MD (Oncology) Follow up visit for bladder cancer and anemia. Proceed with Keytruda 200 mg today. Follow up in 4 weeks. 03/21/21-[name], MD (Oncology) Seen for follow up visit for bladder cancer and anemia. Proceed with Keytruda 200 mg today. Follow up in 4 weeks. 02/21/21-[name], MD (Oncology) Follow up visit on bladder cancer and anemia. Proceed with Keytruda 200 mg. Follow up in 4 weeks. 02/08/21-[name], MD (Gastroenterology) Follow up visit for constipation. Follow up in 3 months. 01/24/21-[name], MD (Oncology) Follow up visit for bladder cancer and anemia. Proceed with Keytruda 200 mg. Follow up in 4 weeks. 12/27/20-[name], MD (Oncology) Follow up visit for bladder cancer and anemia. Follow up in 4 weeks.  Hospital visits:  Medication Reconciliation was completed by comparing discharge summary, patients EMR and Pharmacy list, and upon discussion with patient.   Admitted to the hospital on 01/29/21 due to Hematuria. Discharge date was 01/29/21. Discharged from [place]?Medications Started at [facility] Discharge:?? -started Keflex 500 mg three x daily   Medication Changes at Hospital Discharge: -Changed None noted   Medications Discontinued at Hospital  Discharge: -Stopped None noted   Medications that remain the same after Hospital Discharge:??  -All other medications will remain the same.       Medication Reconciliation was completed by comparing discharge summary, patients EMR and Pharmacy list, and upon discussion with patient.   Admitted to the hospital on 12/13/20 due to Abdominal pain. Discharge date was 12/13/20. Discharged from [place]?Medications Started at [facility] Discharge:?? -started None noted   Medication Changes at Hospital Discharge: -Changed None noted   Medications Discontinued at Hospital Discharge: -Stopped None noted   Medications that remain the same after Hospital Discharge:??  -All other medications will remain the same.   Medications: Outpatient Encounter Medications as of 07/07/2021  Medication Sig   amLODipine (NORVASC) 5 MG tablet Take 1 tablet by mouth once daily   diclofenac sodium (VOLTAREN) 1 % GEL Apply 2 g topically 4 (four) times daily. (Patient taking differently: Apply 2 g topically as needed.)   docusate sodium (COLACE) 100 MG capsule Take 1 capsule (100 mg total) by mouth daily.   lidocaine-prilocaine (EMLA) cream Apply 1 application topically as needed.   losartan (COZAAR) 25 MG tablet Take 1 tablet by mouth once daily   Omega-3 Fatty Acids (FISH OIL) 1000 MG CAPS Take by mouth.   pembrolizumab (KEYTRUDA) 100 MG/4ML SOLN Inject 200 mg into the vein. Every 42 days,    Last txy 4/05, Next 5/03   polyethylene glycol (MIRALAX / GLYCOLAX) 17 g packet Take 17 g by mouth daily as needed for mild constipation.   RYBELSUS 3 MG TABS Take 1 tablet by mouth once daily   [DISCONTINUED] prochlorperazine (COMPAZINE) 10 MG tablet Take 1 tablet (  10 mg total) by mouth every 6 (six) hours as needed (Nausea or vomiting).   Facility-Administered Encounter Medications as of 07/07/2021  Medication   heparin lock flush 100 UNIT/ML injection    Attempted to reach out to the patient  to complete assessment call. I have left 3 voicemail's for patient to return phone call.   Adherence Review: Is the patient currently on a STATIN medication? No Is the patient currently on ACE/ARB medication? Yes Does the patient have >5 day gap between last estimated fill dates? No   Care Gaps: TETANUS/TDAP:Never done Zoster Vaccines- Shingrix:Never done DEXA SCAN:Never done Pneumonia Vaccine 42+ Years [facility]:RFVO completed: May 04, 2017 COVID-19 Vaccine:Last completed: May 20, 2020 INFLUENZA VACCINE:Last completed: Mar 11, 2020 FOOT EXAM:Last completed: Mar 11, 2020  Star Rating Drugs: Losartan 25 mg Last filled:05/25/21 90 DS Rybelsus 3 mg Last filled:05/25/21 90 DS  Myriam Elta [place], [place]

## 2021-07-11 ENCOUNTER — Inpatient Hospital Stay: Payer: Medicare Other

## 2021-07-11 ENCOUNTER — Other Ambulatory Visit: Payer: Self-pay

## 2021-07-11 ENCOUNTER — Inpatient Hospital Stay (HOSPITAL_BASED_OUTPATIENT_CLINIC_OR_DEPARTMENT_OTHER): Payer: Medicare Other | Admitting: Oncology

## 2021-07-11 ENCOUNTER — Inpatient Hospital Stay: Payer: Medicare Other | Attending: Oncology

## 2021-07-11 ENCOUNTER — Encounter: Payer: Self-pay | Admitting: Oncology

## 2021-07-11 VITALS — BP 144/75 | HR 81 | Temp 96.8°F | Resp 18 | Wt 208.9 lb

## 2021-07-11 DIAGNOSIS — C679 Malignant neoplasm of bladder, unspecified: Secondary | ICD-10-CM

## 2021-07-11 DIAGNOSIS — N1832 Chronic kidney disease, stage 3b: Secondary | ICD-10-CM

## 2021-07-11 DIAGNOSIS — C7910 Secondary malignant neoplasm of unspecified urinary organs: Secondary | ICD-10-CM

## 2021-07-11 DIAGNOSIS — Z79899 Other long term (current) drug therapy: Secondary | ICD-10-CM | POA: Insufficient documentation

## 2021-07-11 DIAGNOSIS — E785 Hyperlipidemia, unspecified: Secondary | ICD-10-CM | POA: Insufficient documentation

## 2021-07-11 DIAGNOSIS — Z86010 Personal history of colonic polyps: Secondary | ICD-10-CM | POA: Insufficient documentation

## 2021-07-11 DIAGNOSIS — I129 Hypertensive chronic kidney disease with stage 1 through stage 4 chronic kidney disease, or unspecified chronic kidney disease: Secondary | ICD-10-CM | POA: Diagnosis not present

## 2021-07-11 DIAGNOSIS — I10 Essential (primary) hypertension: Secondary | ICD-10-CM | POA: Diagnosis not present

## 2021-07-11 DIAGNOSIS — D631 Anemia in chronic kidney disease: Secondary | ICD-10-CM | POA: Diagnosis not present

## 2021-07-11 DIAGNOSIS — D649 Anemia, unspecified: Secondary | ICD-10-CM | POA: Insufficient documentation

## 2021-07-11 DIAGNOSIS — Z87891 Personal history of nicotine dependence: Secondary | ICD-10-CM | POA: Insufficient documentation

## 2021-07-11 DIAGNOSIS — M810 Age-related osteoporosis without current pathological fracture: Secondary | ICD-10-CM | POA: Diagnosis not present

## 2021-07-11 DIAGNOSIS — E119 Type 2 diabetes mellitus without complications: Secondary | ICD-10-CM | POA: Insufficient documentation

## 2021-07-11 DIAGNOSIS — Z5112 Encounter for antineoplastic immunotherapy: Secondary | ICD-10-CM | POA: Diagnosis not present

## 2021-07-11 LAB — CBC WITH DIFFERENTIAL/PLATELET
Abs Immature Granulocytes: 0.02 10*3/uL (ref 0.00–0.07)
Basophils Absolute: 0 10*3/uL (ref 0.0–0.1)
Basophils Relative: 1 %
Eosinophils Absolute: 0.2 10*3/uL (ref 0.0–0.5)
Eosinophils Relative: 3 %
HCT: 34 % — ABNORMAL LOW (ref 36.0–46.0)
Hemoglobin: 10.4 g/dL — ABNORMAL LOW (ref 12.0–15.0)
Immature Granulocytes: 0 %
Lymphocytes Relative: 26 %
Lymphs Abs: 1.4 10*3/uL (ref 0.7–4.0)
MCH: 24.2 pg — ABNORMAL LOW (ref 26.0–34.0)
MCHC: 30.6 g/dL (ref 30.0–36.0)
MCV: 79.3 fL — ABNORMAL LOW (ref 80.0–100.0)
Monocytes Absolute: 0.4 10*3/uL (ref 0.1–1.0)
Monocytes Relative: 8 %
Neutro Abs: 3.5 10*3/uL (ref 1.7–7.7)
Neutrophils Relative %: 62 %
Platelets: 235 10*3/uL (ref 150–400)
RBC: 4.29 MIL/uL (ref 3.87–5.11)
RDW: 13.8 % (ref 11.5–15.5)
WBC: 5.6 10*3/uL (ref 4.0–10.5)
nRBC: 0 % (ref 0.0–0.2)

## 2021-07-11 LAB — COMPREHENSIVE METABOLIC PANEL
ALT: 11 U/L (ref 0–44)
AST: 20 U/L (ref 15–41)
Albumin: 3.8 g/dL (ref 3.5–5.0)
Alkaline Phosphatase: 99 U/L (ref 38–126)
Anion gap: 9 (ref 5–15)
BUN: 27 mg/dL — ABNORMAL HIGH (ref 8–23)
CO2: 22 mmol/L (ref 22–32)
Calcium: 9 mg/dL (ref 8.9–10.3)
Chloride: 106 mmol/L (ref 98–111)
Creatinine, Ser: 1.58 mg/dL — ABNORMAL HIGH (ref 0.44–1.00)
GFR, Estimated: 35 mL/min — ABNORMAL LOW (ref >60)
Glucose, Bld: 121 mg/dL — ABNORMAL HIGH (ref 70–99)
Potassium: 4.2 mmol/L (ref 3.5–5.1)
Sodium: 137 mmol/L (ref 135–145)
Total Bilirubin: 0.5 mg/dL (ref 0.3–1.2)
Total Protein: 7.8 g/dL (ref 6.5–8.1)

## 2021-07-11 MED ORDER — HEPARIN SOD (PORK) LOCK FLUSH 100 UNIT/ML IV SOLN
500.0000 [IU] | Freq: Once | INTRAVENOUS | Status: AC | PRN
  Administered 2021-07-11: 500 [IU]
  Filled 2021-07-11: qty 5

## 2021-07-11 MED ORDER — SENNOSIDES-DOCUSATE SODIUM 8.6-50 MG PO TABS: 1.0000 | ORAL_TABLET | Freq: Every day | ORAL | 0 refills | Status: DC

## 2021-07-11 MED ORDER — SODIUM CHLORIDE 0.9 % IV SOLN
Freq: Once | INTRAVENOUS | Status: AC
  Filled 2021-07-11: qty 250

## 2021-07-11 MED ORDER — SODIUM CHLORIDE 0.9 % IV SOLN
200.0000 mg | Freq: Once | INTRAVENOUS | Status: AC
  Administered 2021-07-11: 200 mg via INTRAVENOUS
  Filled 2021-07-11: qty 200

## 2021-07-11 NOTE — Progress Notes (Signed)
°Hematology/Oncology follow up note °[phone] [phone] ° ° °Patient Care Team: °Holdsworth, Karen, NP as PCP - General °Nice, Keith B, OD (Optometry) °, , MD as Consulting Physician (Hematology and Oncology) °Harris, Julie S, RPH (Inactive) (Pharmacist) ° ° °CHIEF COMPLAINTS/REASON FOR VISIT:  °Follow up for bladder cancer, anemia.  ° °HISTORY OF PRESENTING ILLNESS:  °Angela Russell is a  71 y.o.  female with PMH listed below who was referred to me for evaluation of newly diagnosed bladder cancer. °Patient initially presented to emergency room at the end of January 2020 for evaluation of dysuria, hematuria and the left lower quadrant inguinal pain and flank pain.  1/28 2020 CT renal stone study showed suspected irregular wall thickening about the superior bladder, not well assessed due to degree of bladder distention.  Recommend cystoscopy for further evaluation.  No renal stone or obstructive uropathy.  Patient was given IV Rocephin and referred patient for outpatient urology follow-up.  Urine culture was negative.  She again presented to ER after 2 days with similar symptoms.  °Patient has 25-pack-year smoking history, quit approximately 20 years ago.  No family history of any urology malignancies °07/03/2018 another CT abdomen pelvis with contrast was done which showed no nephrolithiasis or hydronephrosis is identified.  Bladder is decompressed limiting evaluation. ° 07/16/2018.urology Dr. Sinskey - cystoscopy and bilateral retrograde pyelogram on 07/16/2018.  Pyelogram did not show any filling defect or abnormalities.  No hydronephrosis.  Ureteral orifice was not involved with tumor.  There is a large 5 cm posterior wall bladder tumor, bullous and sessile appearing.  Patient underwent TURBT.  ° °Pathology: High-grade urothelial carcinoma, invasive into muscularis propria.  Lymphovascular invasion is present.  Carcinoma in situ is also identified.  Focal squamous differentiation is  noted, areas of invasive carcinoma display pleomorphic/sarcomatoid changes.  T2b ° °08/07/2018 CT without contrast negative.  2 subpleural right upper lobe nodule 2 to 3 mm likely benign. °She also had baseline audiometry done.  °08/04/2018 ddMVAC x 1 cycle, stopped due to intolerance and AKI. ° °Patient received 1 cycle of dd MVAC, not able to tolerate due to AKI. °Patient then was referred to [facility]  °09/22/2018 patient underwent a cystectomy, pathology pT3a N0 Mx.  11 lymph nodes were harvested and was all negative. °Invasive urothelia carcinoma, high grade, with sarcomatoid features.  ° °10/14/2018 patient was admitted due to pyelonephritis and a pelvic fluid collection.  Drain was placed. °10/23/2018 drain was removed. °Patient has had difficulties getting to her appointments to [facility]. °02/09/2019, patient presented with abdominal pain. °CT concerning for small bowel obstruction and increased size of right pelvic fluid collection concerning for cancer recurrence.  Right hydronephrosis to the level of pelvis and enlarged lymph nodes. °Patient had JP drain placed with CT guidance to pelvic fluid collection and drained 400 cc amber fluid.  Culture was negative for growth of microorganisms and cytology was negative for malignancy.-JP drain was removed on the day of discharge. °CT-guided core biopsy of pelvic lymph node adenopathy was attempted but not successful. ° °# Right lower extremity DVT, provoked by Transvenous biopsy  °02/26/2019 transvenous biopsy by IR unsuccessful transvenous biopsy of the right pelvis mass. °Post biopsy acute thrombus in the right external iliac and common femoral vein.  Patient was recommended to start anticoagulation with Xarelto however due to the co-pay, patient is not able to afford the medication. °Anticoagulation regimen was switched to Eliquis 5 mg.  Patient reports that she has been taking it once a   a day. 03/20/2019 PET showed FDG avid tissue in the cystectomy bed,  retroperitoneal and pelvic lymphadenopathy.  Right common iliac DVT.  # increased right lower extremity swelling.  She was started on Eliquis for anticoagulation by [facility].  We clarified with her pharmacy and she was actually taking Eliquis 2.5 mg twice daily. Right lower extremity swelling has not improved but instead worsened. 03/16/2019 She had ultrasound right lower extremity done which showed persistent extensive proximal right lower extremity DVT.  Anticoagulation regimen has increased to Eliquis 5 mg twice daily.  # establish care with [facility] oncology Dr. [name] for evaluation.  Dr. [name] recommended starting immunotherapy with PD-L1 inhibitor Pembrolizumab 200 mg every 3 weeks.  The sarcomatoid histology may not respond to chemotherapy well, could portend a better chance of response into immunotherapy PET scan after 4 cycles of Keytruda showed single mildly enlarged central mesenteric lymph node in the upper pelvis with SUV 9.6.  No other abnormal hypermetabolic activity was reported.  # #Right lower extremity  femoral vein DVT provoked by transvenous biopsy in the context of cancer recurrence, Status post IV C filter and mechanical thrombectomy.  IVC filter has been removed. She is currently off anticoagulation after she developed hematuria.  # 03/30/2019 Status post IVC filter placement, mechanical thrombectomy.-IVC filter was retrieved in January 2021. # PD-L1 CPS 100%.  # 03/31/2019 started on immunotherapy Keytruda.  # 04/26/2020 CT chest abdomen pelvis was reviewed and discussed with patient. No definitive finding of disease recurrence or metastasis.  Small amount of ascites similar to prior. There is a parastomal hernia containing a small margin of adjacent small bowel.  This is associated with wall thickening in approximately 7 cm segment of bowel adjacent to the hernia.  There is adjacent abnormal stranding of the mesentery and omentum.  # 08/19/2020, CT chest abdomen pelvis  showed no evidence of new/progressive metastatic disease. NED Hold off treatment due to uncontrolled hypotension.  # Chronic abdominal discomfort due to hernia. 08/11/2020, patient was seen by Dr. Dolphus Jenny for parastomal hernia.  Patient has no obstructive symptoms and would like to continue to monitor her symptoms and hold off any intervention at this point.  Patient was recommended to wear ostomy hernia belt and was referred to [facility] wound ostomy nursing team.  04/10/2021 CT chest abdomen pelvis without contrast showed no evidence of disease recurrence.  Small volume fluid in the pelvis, decreased.  Emphysema/diffuse bilateral bronchial wall thickening.  CAD.  She received IV venofer previously and did not tolerate due to energized joint pain afterwards.  #Hernia -parastomal hernia.   She has establish care with [facility] hernia center.  She was recommended to monitor her symptoms and aware ostomy belt  INTERVAL HISTORY Angela Russell is a 71 y.o. female who has above history reviewed by me today presents for follow up visit for evaluation form metastatic high-grade urothelial carcinoma of the bladder. Sarcomatoid feature.  Patient has been on maintenance immunotherapy.   She is currently on Keytruda every 4 weeks per patient's request. She reports abdominal cramps when she bends over.  No nausea vomiting, diarrhea.  Review of Systems  Constitutional:  Negative for appetite change, chills, fatigue and fever.  HENT:   Negative for hearing loss and voice change.   Eyes:  Negative for eye problems.  Respiratory:  Negative for chest tightness and cough.   Cardiovascular:  Negative for chest pain and leg swelling.  Gastrointestinal:  Negative for abdominal distention, abdominal pain, blood in stool and constipation.  Negative for hot flashes.  °Genitourinary:  Negative for difficulty urinating and frequency.   °Musculoskeletal:  Positive for arthralgias.  °Skin:  Negative for itching and rash.   °Neurological:  Positive for numbness. Negative for extremity weakness.  °Hematological:  Negative for adenopathy.  °Psychiatric/Behavioral:  Negative for confusion. The patient is not nervous/anxious.   ° °MEDICAL HISTORY:  °Past Medical History:  °Diagnosis Date  ° Carotid artery plaque, right 01/2014  ° CKD (chronic kidney disease)   ° stage 2-3  ° Controlled diabetes mellitus type 2 with complications (HCC)   ° Diabetes mellitus without complication (HCC)   ° DM (diabetes mellitus), type 2, uncontrolled   ° Hyperlipidemia   ° Hypertension   ° Hypochromic microcytic anemia   ° mild  ° Metastatic urothelial carcinoma (HCC) 03/23/2019  ° Osteoporosis   ° Personal history of colonic polyps   ° Renal insufficiency   ° Rotator cuff tendonitis, right   ° ° °SURGICAL HISTORY: °Past Surgical History:  °Procedure Laterality Date  ° ABDOMINAL HYSTERECTOMY  2000  ° due to bleeding and fibroids, partial- still has ovaries  ° COLONOSCOPY WITH PROPOFOL N/A 03/17/2021  ° Procedure: COLONOSCOPY WITH PROPOFOL;  Surgeon: Anna, Kiran, MD;  Location: [facility] ENDOSCOPY;  Service: Gastroenterology;  Laterality: N/A;  ° CYSTOSCOPY W/ RETROGRADES Bilateral 07/16/2018  ° Procedure: CYSTOSCOPY WITH RETROGRADE PYELOGRAM;  Surgeon: Sninsky, Brian C, MD;  Location: [facility] ORS;  Service: Urology;  Laterality: Bilateral;  ° ESOPHAGOGASTRODUODENOSCOPY  03/17/2021  ° Procedure: ESOPHAGOGASTRODUODENOSCOPY (EGD);  Surgeon: Anna, Kiran, MD;  Location: [facility] ENDOSCOPY;  Service: Gastroenterology;;  ° GIVENS CAPSULE STUDY N/A 06/19/2021  ° Procedure: GIVENS CAPSULE STUDY;  Surgeon: Anna, Kiran, MD;  Location: [facility] ENDOSCOPY;  Service: Gastroenterology;  Laterality: N/A;  ° IVC FILTER INSERTION N/A 03/30/2019  ° Procedure: IVC FILTER INSERTION;  Surgeon: Dew, Jason S, MD;  Location: [facility] INVASIVE CV LAB;  Service: Cardiovascular;  Laterality: N/A;  ° IVC FILTER REMOVAL N/A 06/15/2019  ° Procedure: IVC FILTER REMOVAL;  Surgeon: Dew, Jason S, MD;  Location:  [facility] INVASIVE CV LAB;  Service: Cardiovascular;  Laterality: N/A;  ° KNEE SURGERY Left 03/17/2013  ° torn meniscus  ° PERIPHERAL VASCULAR THROMBECTOMY Right 03/30/2019  ° Procedure: PERIPHERAL VASCULAR THROMBECTOMY;  Surgeon: Dew, Jason S, MD;  Location: [facility] INVASIVE CV LAB;  Service: Cardiovascular;  Laterality: Right;  ° PORTA CATH INSERTION N/A 08/06/2018  ° Procedure: PORTA CATH INSERTION;  Surgeon: Dew, Jason S, MD;  Location: [facility] INVASIVE CV LAB;  Service: Cardiovascular;  Laterality: N/A;  ° TRANSURETHRAL RESECTION OF BLADDER TUMOR N/A 07/16/2018  ° Procedure: TRANSURETHRAL RESECTION OF BLADDER TUMOR (TURBT);  Surgeon: Sninsky, Brian C, MD;  Location: [facility] ORS;  Service: Urology;  Laterality: N/A;  ° ° °SOCIAL HISTORY: °Social History  ° °Socioeconomic History  ° Marital status: Single  °  Spouse name: Not on file  ° Number of children: 1  ° Years of education: Not on file  ° Highest education level: 10th grade  °Occupational History  ° Not on file  °Tobacco Use  ° Smoking status: Former  °  Types: Cigarettes  °  Quit date: 06/05/1991  °  Years since quitting: 30.1  ° Smokeless tobacco: Never  °Vaping Use  ° Vaping Use: Never used  °Substance and Sexual Activity  ° Alcohol use: No  ° Drug use: No  ° Sexual activity: Not Currently  °Other Topics Concern  ° Not on file  °Social History Narrative  ° Working full time  ° °  Social Determinants of Health  ° °Financial Resource Strain: Low Risk   ° Difficulty of Paying Living Expenses: Not hard at all  °Food Insecurity: Not on file  °Transportation Needs: Not on file  °Physical Activity: Not on file  °Stress: Not on file  °Social Connections: Not on file  °Intimate Partner Violence: Not on file  ° ° °FAMILY HISTORY: °Family History  °Problem Relation Age of Onset  ° Heart disease Mother   ° Heart attack Mother   ° Arthritis Father   ° Diabetes Brother   ° ° °ALLERGIES:  has No Known Allergies. ° °MEDICATIONS:  °Current Outpatient Medications  °Medication Sig Dispense  Refill  ° amLODipine (NORVASC) 5 MG tablet Take 1 tablet by mouth once daily 90 tablet 0  ° diclofenac sodium (VOLTAREN) 1 % GEL Apply 2 g topically 4 (four) times daily. (Patient taking differently: Apply 2 g topically as needed.) 100 g 2  ° docusate sodium (COLACE) 100 MG capsule Take 1 capsule (100 mg total) by mouth daily. 90 capsule 0  ° lidocaine-prilocaine (EMLA) cream Apply 1 application topically as needed. 30 g 3  ° losartan (COZAAR) 25 MG tablet Take 1 tablet by mouth once daily 90 tablet 0  ° Omega-3 Fatty Acids (FISH OIL) 1000 MG CAPS Take by mouth.    ° pembrolizumab (KEYTRUDA) 100 MG/4ML SOLN Inject 200 mg into the vein. Every 42 days,    Last txy 4/05, Next 5/03    ° polyethylene glycol (MIRALAX / GLYCOLAX) 17 g packet Take 17 g by mouth daily as needed for mild constipation. 14 each 0  ° RYBELSUS 3 MG TABS Take 1 tablet by mouth once daily 90 tablet 1  ° senna-docusate (SENOKOT-S) 8.6-50 MG tablet Take 1 tablet by mouth daily. 30 tablet 0  ° °No current facility-administered medications for this visit.  ° °Facility-Administered Medications Ordered in Other Visits  °Medication Dose Route Frequency Provider Last Rate Last Admin  ° heparin lock flush 100 UNIT/ML injection           ° ° ° °PHYSICAL EXAMINATION: °ECOG PERFORMANCE STATUS: 1 - Symptomatic but completely ambulatory °Vitals:  ° 07/11/21 0836  °BP: (!) 144/75  °Pulse: 81  °Resp: 18  °Temp: (!) 96.8 °F (36 °C)  ° °Filed Weights  ° 07/11/21 0836  °Weight: 208 lb 14.4 oz (94.8 kg)  ° ° °Physical Exam °Constitutional:   °   General: She is not in acute distress. °   Comments: Walk independently  °HENT:  °   Head: Normocephalic and atraumatic.  °Eyes:  °   General: No scleral icterus. °   Pupils: Pupils are equal, round, and reactive to light.  °Cardiovascular:  °   Rate and Rhythm: Normal rate and regular rhythm.  °   Heart sounds: Normal heart sounds.  °Pulmonary:  °   Effort: Pulmonary effort is normal. No respiratory distress.  °   Breath  sounds: No wheezing.  °Abdominal:  °   General: Bowel sounds are normal. There is no distension.  °   Palpations: Abdomen is soft. There is no mass.  °   Comments: Ureterostomy,   °Musculoskeletal:     °   General: No deformity. Normal range of motion.  °   Cervical back: Normal range of motion and neck supple.  °Skin: °   General: Skin is warm and dry.  °   Coloration: Skin is not pale.  °   Findings: No erythema or rash.  °  or rash.  Neurological:     Mental Status: She is alert and oriented to person, place, and time. Mental status is at baseline.     Cranial Nerves: No cranial nerve deficit.     Coordination: Coordination normal.  Psychiatric:        Mood and Affect: Mood normal.  .  RADIOGRAPHIC STUDIES: I have personally reviewed the radiological images as listed and agreed with the findings in the report.  CMP Latest Ref Rng & Units 07/11/2021  Glucose 70 - 99 mg/dL 121(H)  BUN 8 - 23 mg/dL 27(H)  Creatinine 0.44 - 1.00 mg/dL 1.58(H)  Sodium 135 - 145 mmol/L 137  Potassium 3.5 - 5.1 mmol/L 4.2  Chloride 98 - 111 mmol/L 106  CO2 22 - 32 mmol/L 22  Calcium 8.9 - 10.3 mg/dL 9.0  Total Protein 6.5 - 8.1 g/dL 7.8  Total Bilirubin 0.3 - 1.2 mg/dL 0.5  Alkaline Phos 38 - 126 U/L 99  AST 15 - 41 U/L 20  ALT 0 - 44 U/L 11   CBC Latest Ref Rng & Units 07/11/2021  WBC 4.0 - 10.5 K/uL 5.6  Hemoglobin 12.0 - 15.0 g/dL 10.4(L)  Hematocrit 36.0 - 46.0 % 34.0(L)  Platelets 150 - 400 K/uL 235    LABORATORY DATA:  I have reviewed the data as listed Lab Results  Component Value Date   WBC 5.6 07/11/2021   HGB 10.4 (L) 07/11/2021   HCT 34.0 (L) 07/11/2021   MCV 79.3 (L) 07/11/2021   PLT 235 07/11/2021   Recent Labs    05/16/21 0828 06/13/21 0844 07/11/21 0816  NA 139 137 137  K 4.3 3.7 4.2  CL 107 107 106  CO2 21* 22 22  GLUCOSE 139* 135* 121*  BUN 26* 26* 27*  CREATININE 1.56* 1.51* 1.58*  CALCIUM 8.9 9.0 9.0  GFRNONAA 35* 37* 35*  PROT 7.4 7.8 7.8  ALBUMIN 3.7 3.9 3.8  AST _0 ALT _1 ALKPHOS 98 114 99  BILITOT 0.3 0.1* 0.5    Iron/TIBC/Ferritin/ %Sat    Component Value Date/Time   IRON 62 01/24/2021 0838   IRON 26 (L) 02/04/2019 1309   TIBC 315 01/24/2021 0838   TIBC 251 02/04/2019 1309   FERRITIN 239 01/24/2021 0838   FERRITIN 409 (H) 02/04/2019 1309   IRONPCTSAT 20 01/24/2021 0838   IRONPCTSAT 10 (L) 02/04/2019 1309    RADIOGRAPHIC STUDIES: I have personally reviewed the radiological images as listed and agreed with the findings in the report. No results found.    ASSESSMENT & PLAN:  1. Metastatic urothelial carcinoma ([place])   2. Encounter for antineoplastic immunotherapy   3. Bladder carcinoma ([place])   4. Anemia in stage 3b chronic kidney disease [facility])    Cancer Staging  Bladder carcinoma [facility]) Staging form: Urinary Bladder, AJCC 8th Edition - Clinical stage from 08/01/2018: Stage IVA (cTX, cN3, cM1a) - Signed by [name], MD on 04/21/2019  #Metastatic urothelial carcinoma of bladder, sarcomatoid features pelvic sidewall mass biopsy showed metastatic carcinoma consistent with involvement by urothelial carcinoma., Stage IV NED-  Labs are reviewed and discussed with patient. Proceed with [place].  Plan to repeat CT scan in late February/early March 2023.  #Abdominal cramps, patient will have CT for evaluation.  Possible due to constipation.  Recommend patient to try Senokot 1 tablet daily for  Chronic kidney disease, stable creatinine today.  Avoid nephrotoxin.  Encourage hydration. #Chronic anemia secondary to stage III CKD.  today.  Not on oral iron supplementation. °Patient declines IV Venofer due to side effects.  Hemoglobin is stable. ° °#Follow-up in 4 weeks for repeat blood work, MD evaluation and Keytruda .  Patient prefers every 4 weeks treatment ° °.All questions were answered. The patient knows to call the clinic with any problems questions or concerns. ° ° , MD, PhD °07/11/21 °

## 2021-07-11 NOTE — Patient Instructions (Signed)
[facility]  Discharge Instructions: °Thank you for choosing [facility] to provide your oncology and hematology care.  °If you have a lab appointment with the Cancer Center, please go directly to the Cancer Center and check in at the registration area. ° °Wear comfortable clothing and clothing appropriate for easy access to any Portacath or PICC line.  ° °We strive to give you quality time with your provider. You may need to reschedule your appointment if you arrive late (15 or more minutes).  Arriving late affects you and other patients whose appointments are after yours.  Also, if you miss three or more appointments without notifying the office, you may be dismissed from the clinic at the provider’s discretion.    °  °For prescription refill requests, have your pharmacy contact our office and allow 72 hours for refills to be completed.   ° °Today you received the following chemotherapy and/or immunotherapy agents Keytruda °    °  °To help prevent nausea and vomiting after your treatment, we encourage you to take your nausea medication as directed. ° °BELOW ARE SYMPTOMS THAT SHOULD BE REPORTED IMMEDIATELY: °*FEVER GREATER THAN 100.4 F (38 °C) OR HIGHER °*CHILLS OR SWEATING °*NAUSEA AND VOMITING THAT IS NOT CONTROLLED WITH YOUR NAUSEA MEDICATION °*UNUSUAL SHORTNESS OF BREATH °*UNUSUAL BRUISING OR BLEEDING °*URINARY PROBLEMS (pain or burning when urinating, or frequent urination) °*BOWEL PROBLEMS (unusual diarrhea, constipation, pain near the anus) °TENDERNESS IN MOUTH AND THROAT WITH OR WITHOUT PRESENCE OF ULCERS (sore throat, sores in mouth, or a toothache) °UNUSUAL RASH, SWELLING OR PAIN  °UNUSUAL VAGINAL DISCHARGE OR ITCHING  ° °Items with * indicate a potential emergency and should be followed up as soon as possible or go to the Emergency Department if any problems should occur. ° °Please show the CHEMOTHERAPY ALERT CARD or IMMUNOTHERAPY ALERT CARD at check-in to  the Emergency Department and triage nurse. ° °Should you have questions after your visit or need to cancel or reschedule your appointment, please contact [facility]  [phone] and follow the prompts.  Office hours are 8:00 a.m. to 4:30 p.m. Monday - Friday. Please note that voicemails left after 4:00 p.m. may not be returned until the following business day.  We are closed weekends and major holidays. You have access to a nurse at all times for urgent questions. Please call the main number to the clinic [phone] and follow the prompts. ° °For any non-urgent questions, you may also contact your provider using MyChart. We now offer e-Visits for anyone 18 and older to request care online for non-urgent symptoms. For details visit [email or web address]. °  °Also download the MyChart app! Go to the app store, search "MyChart", open the app, select [facility], and log in with your MyChart username and password. ° °Due to Covid, a mask is required upon entering the hospital/clinic. If you do not have a mask, one will be given to you upon arrival. For doctor visits, patients may have 1 support person aged 18 or older with them. For treatment visits, patients cannot have anyone with them due to current Covid guidelines and our immunocompromised population.  °

## 2021-07-11 NOTE — Progress Notes (Signed)
Patient here for follow up. She reports she has been having stomach cramps to mid abdomen.

## 2021-07-14 ENCOUNTER — Ambulatory Visit (INDEPENDENT_AMBULATORY_CARE_PROVIDER_SITE_OTHER): Payer: Medicare Other | Admitting: Nurse Practitioner

## 2021-07-14 ENCOUNTER — Other Ambulatory Visit: Payer: Self-pay

## 2021-07-14 ENCOUNTER — Encounter: Payer: Self-pay | Admitting: Nurse Practitioner

## 2021-07-14 VITALS — BP 140/76 | HR 91 | Temp 97.9°F | Wt 208.4 lb

## 2021-07-14 DIAGNOSIS — N1832 Chronic kidney disease, stage 3b: Secondary | ICD-10-CM

## 2021-07-14 DIAGNOSIS — D631 Anemia in chronic kidney disease: Secondary | ICD-10-CM

## 2021-07-14 DIAGNOSIS — E1159 Type 2 diabetes mellitus with other circulatory complications: Secondary | ICD-10-CM | POA: Diagnosis not present

## 2021-07-14 DIAGNOSIS — N184 Chronic kidney disease, stage 4 (severe): Secondary | ICD-10-CM

## 2021-07-14 DIAGNOSIS — E118 Type 2 diabetes mellitus with unspecified complications: Secondary | ICD-10-CM

## 2021-07-14 DIAGNOSIS — E782 Mixed hyperlipidemia: Secondary | ICD-10-CM

## 2021-07-14 DIAGNOSIS — C7910 Secondary malignant neoplasm of unspecified urinary organs: Secondary | ICD-10-CM

## 2021-07-14 DIAGNOSIS — Z23 Encounter for immunization: Secondary | ICD-10-CM

## 2021-07-14 DIAGNOSIS — I152 Hypertension secondary to endocrine disorders: Secondary | ICD-10-CM

## 2021-07-14 MED ORDER — RYBELSUS 3 MG PO TABS: 1.0000 | ORAL_TABLET | Freq: Every day | ORAL | 1 refills | Status: DC

## 2021-07-14 MED ORDER — AMLODIPINE BESYLATE 5 MG PO TABS: 5.0000 mg | ORAL_TABLET | Freq: Every day | ORAL | 1 refills | Status: DC

## 2021-07-14 MED ORDER — LOSARTAN POTASSIUM 25 MG PO TABS: 25.0000 mg | ORAL_TABLET | Freq: Every day | ORAL | 1 refills | Status: DC

## 2021-07-14 NOTE — Progress Notes (Signed)
BP 140/76    Pulse 91    Temp 97.9 F (36.6 C) (Oral)    Wt 208 lb 6.4 oz (94.5 kg)    SpO2 98%    BMI 36.92 kg/m    Subjective:    Patient ID: Angela Russell, female    DOB: 13-Mar-1951, 71 y.o.   MRN: [id number]  HPI: Angela Russell is a 71 y.o. female  Chief Complaint  Patient presents with   Diabetes   Hypertension   Hyperlipidemia   HYPERTENSION Hypertension status: controlled  Satisfied with current treatment? yes Duration of hypertension: years BP monitoring frequency:  rarely BP range: 130/70-80 BP medication side effects:  no Medication compliance: excellent compliance Previous BP meds:amlodipine and losartan (cozaar) Aspirin: no Recurrent headaches: no Visual changes: no Palpitations: no Dyspnea: no Chest pain: no Lower extremity edema: no Dizzy/lightheaded: no Tried taking cholesterol medication.  However it made her very sore and she was not able to continue.   DIABETES Hypoglycemic episodes:no Polydipsia/polyuria: no Visual disturbance: no Chest pain: no Paresthesias: no Glucose Monitoring: no  Accucheck frequency: Daily  Fasting glucose: 120-130s  Post prandial:  Evening:  Before meals: Taking Insulin?: no  Long acting insulin:  Short acting insulin: Blood Pressure Monitoring: rarely Retinal Examination: Up to Date Foot Exam: Up to Date Diabetic Education: Not Completed Pneumovax: Up to Date Influenza: Up to Date Aspirin: yes  CHRONIC KIDNEY DISEASE CKD status: controlled Medications renally dose: yes Previous renal evaluation: yes Pneumovax:  Up to Date Influenza Vaccine:  Up to Date  ANEMIA Anemia status: stable Etiology of anemia:CKD Duration of anemia treatment:  Compliance with treatment: excellent compliance Iron supplementation side effects: no Severity of anemia: moderate Fatigue: yes Decreased exercise tolerance: no  Dyspnea on exertion: no Palpitations: no Bleeding: no Pica: no  Relevant past medical,  surgical, family and social history reviewed and updated as indicated. Interim medical history since our last visit reviewed. Allergies and medications reviewed and updated.  Review of Systems  Eyes:  Negative for visual disturbance.  Respiratory:  Negative for cough, chest tightness and shortness of breath.   Cardiovascular:  Negative for chest pain, palpitations and leg swelling.  Endocrine: Negative for polydipsia and polyuria.  Neurological:  Negative for dizziness, numbness and headaches.   Per HPI unless specifically indicated above     Objective:    BP 140/76    Pulse 91    Temp 97.9 F (36.6 C) (Oral)    Wt 208 lb 6.4 oz (94.5 kg)    SpO2 98%    BMI 36.92 kg/m   Wt Readings from Last 3 Encounters:  07/14/21 208 lb 6.4 oz (94.5 kg)  07/11/21 208 lb 14.4 oz (94.8 kg)  06/13/21 213 lb 6.4 oz (96.8 kg)    Physical Exam Vitals and nursing note reviewed.  Constitutional:      General: She is not in acute distress.    Appearance: Normal appearance. She is normal weight. She is not ill-appearing, toxic-appearing or diaphoretic.  HENT:     Head: Normocephalic.     Right Ear: External ear normal.     Left Ear: External ear normal.     Nose: Nose normal.     Mouth/Throat:     Mouth: Mucous membranes are moist.     Pharynx: Oropharynx is clear.  Eyes:     General:        Right eye: No discharge.        Left eye: No discharge.  Extraocular Movements: Extraocular movements intact.     Conjunctiva/sclera: Conjunctivae normal.     Pupils: Pupils are equal, round, and reactive to light.  Cardiovascular:     Rate and Rhythm: Normal rate and regular rhythm.     Heart sounds: No murmur heard. Pulmonary:     Effort: Pulmonary effort is normal. No respiratory distress.     Breath sounds: Normal breath sounds. No wheezing or rales.  Musculoskeletal:     Cervical back: Normal range of motion and neck supple.  Skin:    General: Skin is warm and dry.     Capillary Refill:  Capillary refill takes less than 2 seconds.  Neurological:     General: No focal deficit present.     Mental Status: She is alert and oriented to person, place, and time. Mental status is at baseline.  Psychiatric:        Mood and Affect: Mood normal.        Behavior: Behavior normal.        Thought Content: Thought content normal.        Judgment: Judgment normal.    Results for orders placed or performed in visit on 07/11/21  CBC with Differential/Platelet  Result Value Ref Range   WBC 5.6 4.0 - 10.5 K/uL   RBC 4.29 3.87 - 5.11 MIL/uL   Hemoglobin 10.4 (L) 12.0 - 15.0 g/dL   HCT 34.0 (L) 36.0 - 46.0 %   MCV 79.3 (L) 80.0 - 100.0 fL   MCH 24.2 (L) 26.0 - 34.0 pg   MCHC 30.6 30.0 - 36.0 g/dL   RDW 13.8 11.5 - 15.5 %   Platelets 235 150 - 400 K/uL   nRBC 0.0 0.0 - 0.2 %   Neutrophils Relative % 62 %   Neutro Abs 3.5 1.7 - 7.7 K/uL   Lymphocytes Relative 26 %   Lymphs Abs 1.4 0.7 - 4.0 K/uL   Monocytes Relative 8 %   Monocytes Absolute 0.4 0.1 - 1.0 K/uL   Eosinophils Relative 3 %   Eosinophils Absolute 0.2 0.0 - 0.5 K/uL   Basophils Relative 1 %   Basophils Absolute 0.0 0.0 - 0.1 K/uL   Immature Granulocytes 0 %   Abs Immature Granulocytes 0.02 0.00 - 0.07 K/uL  Comprehensive metabolic panel  Result Value Ref Range   Sodium 137 135 - 145 mmol/L   Potassium 4.2 3.5 - 5.1 mmol/L   Chloride 106 98 - 111 mmol/L   CO2 22 22 - 32 mmol/L   Glucose, Bld 121 (H) 70 - 99 mg/dL   BUN 27 (H) 8 - 23 mg/dL   Creatinine, Ser 1.58 (H) 0.44 - 1.00 mg/dL   Calcium 9.0 8.9 - 10.3 mg/dL   Total Protein 7.8 6.5 - 8.1 g/dL   Albumin 3.8 3.5 - 5.0 g/dL   AST 20 15 - 41 U/L   ALT 11 0 - 44 U/L   Alkaline Phosphatase 99 38 - 126 U/L   Total Bilirubin 0.5 0.3 - 1.2 mg/dL   GFR, Estimated 35 (L) >60 mL/min   Anion gap 9 5 - 15      Assessment & Plan:   Problem List Items Addressed This Visit       Cardiovascular and Mediastinum   Hypertension associated with diabetes ([place]) - Primary     Chronic.  Controlled.  Continue with current medication regimen of Amlodipine 75m daily and Losartan 289mdaily.  Labs ordered today.  Refill sent today. Return to  clinic in 6 months for reevaluation.  Call sooner if concerns arise.        Relevant Medications   amLODipine (NORVASC) 5 MG tablet   losartan (COZAAR) 25 MG tablet   Semaglutide (RYBELSUS) 3 MG TABS   Other Relevant Orders   Comp Met (CMET)     Endocrine   Controlled diabetes mellitus type 2 with complications (HCC)    Chronic.  Controlled.  Continue with current medication regimen of Rybelsus 33m.  Refill sent today.  Labs ordered today.  Return to clinic in 6 months for reevaluation.  Call sooner if concerns arise.        Relevant Medications   losartan (COZAAR) 25 MG tablet   Semaglutide (RYBELSUS) 3 MG TABS   Other Relevant Orders   HgB A1c     Genitourinary   CKD (chronic kidney disease)    Chronic. Labs ordered today.  Followed by Nephrology.  Will make recommendations based on lab results.       Metastatic [place]rothelial carcinoma ([place]    Followed by Oncology. Continue to follow per their recommendations.      Anemia in stage 4 chronic kidney disease ([place]    Labs ordered today. Will make recommendations based on lab results. Continue to follow up with Nephrology.        Relevant Orders   CBC w/Diff     Other   Hyperlipidemia    Chronic. Labs ordered today. Not able to tolerate Crestor.  Can consider Zetia if cholesterol is still elevated.       Relevant Medications   amLODipine (NORVASC) 5 MG tablet   losartan (COZAAR) 25 MG tablet   Other Relevant Orders   Lipid Profile   Other Visit Diagnoses     Need for influenza vaccination       Relevant Orders   Flu Vaccine QUAD High Dose(Fluad) (Completed)   Need for pneumococcal vaccination       Relevant Orders   Pneumococcal polysaccharide vaccine 23-valent greater than or equal to 2yo subcutaneous/IM (Completed)        Follow up  plan: Return in about 6 months (around 01/11/2022) for HTN, HLD, DM2 FU.

## 2021-07-14 NOTE — Assessment & Plan Note (Signed)
Chronic. Labs ordered today.  Followed by Nephrology.  Will make recommendations based on lab results.

## 2021-07-14 NOTE — Assessment & Plan Note (Signed)
Followed by Oncology. Continue to follow per their recommendations.

## 2021-07-14 NOTE — Assessment & Plan Note (Signed)
Chronic.  Controlled.  Continue with current medication regimen of Rybelsus 3mg .  Refill sent today.  Labs ordered today.  Return to clinic in 6 months for reevaluation.  Call sooner if concerns arise.

## 2021-07-14 NOTE — Assessment & Plan Note (Signed)
Chronic.  Controlled.  Continue with current medication regimen of Amlodipine 5mg daily and Losartan 25mg daily.  Labs ordered today.  Refill sent today. Return to clinic in 6 months for reevaluation.  Call sooner if concerns arise.   

## 2021-07-14 NOTE — Assessment & Plan Note (Signed)
Labs ordered today. Will make recommendations based on lab results. Continue to follow up with Nephrology.

## 2021-07-14 NOTE — Assessment & Plan Note (Signed)
Chronic. Labs ordered today. Not able to tolerate Crestor.  Can consider Zetia if cholesterol is still elevated.

## 2021-07-15 ENCOUNTER — Encounter: Payer: Self-pay | Admitting: Oncology

## 2021-07-15 LAB — COMPREHENSIVE METABOLIC PANEL
ALT: 8 IU/L (ref 0–32)
AST: 17 IU/L (ref 0–40)
Albumin/Globulin Ratio: 1.7 (ref 1.2–2.2)
Albumin: 4.5 g/dL (ref 3.7–4.7)
Alkaline Phosphatase: 123 IU/L — ABNORMAL HIGH (ref 44–121)
BUN/Creatinine Ratio: 18 (ref 12–28)
BUN: 27 mg/dL (ref 8–27)
Bilirubin Total: 0.2 mg/dL (ref 0.0–1.2)
CO2: 23 mmol/L (ref 20–29)
Calcium: 9.3 mg/dL (ref 8.7–10.3)
Chloride: 104 mmol/L (ref 96–106)
Creatinine, Ser: 1.52 mg/dL — ABNORMAL HIGH (ref 0.57–1.00)
Globulin, Total: 2.7 g/dL (ref 1.5–4.5)
Glucose: 114 mg/dL — ABNORMAL HIGH (ref 70–99)
Potassium: 4.5 mmol/L (ref 3.5–5.2)
Sodium: 142 mmol/L (ref 134–144)
Total Protein: 7.2 g/dL (ref 6.0–8.5)
eGFR: 36 mL/min/{1.73_m2} — ABNORMAL LOW (ref >59)

## 2021-07-15 LAB — LIPID PANEL
Chol/HDL Ratio: 3.2 ratio (ref 0.0–4.4)
Cholesterol, Total: 190 mg/dL (ref 100–199)
HDL: 59 mg/dL (ref >39)
LDL Chol Calc (NIH): 116 mg/dL — ABNORMAL HIGH (ref 0–99)
Triglycerides: 82 mg/dL (ref 0–149)
VLDL Cholesterol Cal: 15 mg/dL (ref 5–40)

## 2021-07-15 LAB — CBC WITH DIFFERENTIAL/PLATELET
Basophils Absolute: 0 10*3/uL (ref 0.0–0.2)
Basos: 1 %
EOS (ABSOLUTE): 0.2 10*3/uL (ref 0.0–0.4)
Eos: 3 %
Hematocrit: 34.7 % (ref 34.0–46.6)
Hemoglobin: 10.8 g/dL — ABNORMAL LOW (ref 11.1–15.9)
Immature Grans (Abs): 0 10*3/uL (ref 0.0–0.1)
Immature Granulocytes: 1 %
Lymphocytes Absolute: 1.2 10*3/uL (ref 0.7–3.1)
Lymphs: 27 %
MCH: 24 pg — ABNORMAL LOW (ref 26.6–33.0)
MCHC: 31.1 g/dL — ABNORMAL LOW (ref 31.5–35.7)
MCV: 77 fL — ABNORMAL LOW (ref 79–97)
Monocytes Absolute: 0.3 10*3/uL (ref 0.1–0.9)
Monocytes: 7 %
Neutrophils Absolute: 2.7 10*3/uL (ref 1.4–7.0)
Neutrophils: 61 %
Platelets: 257 10*3/uL (ref 150–450)
RBC: 4.5 x10E6/uL (ref 3.77–5.28)
RDW: 13.6 % (ref 11.7–15.4)
WBC: 4.4 10*3/uL (ref 3.4–10.8)

## 2021-07-15 LAB — HEMOGLOBIN A1C
Est. average glucose Bld gHb Est-mCnc: 148 mg/dL
Hgb A1c MFr Bld: 6.8 % — ABNORMAL HIGH (ref 4.8–5.6)

## 2021-07-17 NOTE — Progress Notes (Signed)
Please let patient know that her lab work looks good.  Kidney function remains stable from prior.  A1c is 6.8 which is great news.  Keep up the good work.  Cholesterol is slightly elevated.  I recommend a low fat diet.  Otherwise, blood work looks good.  No concerns at this time.

## 2021-07-31 ENCOUNTER — Ambulatory Visit
Admission: RE | Admit: 2021-07-31 | Discharge: 2021-07-31 | Disposition: A | Discharge status: Home / Self Care | Payer: Medicare Other | Source: Ambulatory Visit | Attending: Oncology | Admitting: Oncology

## 2021-07-31 ENCOUNTER — Other Ambulatory Visit: Payer: Self-pay

## 2021-07-31 DIAGNOSIS — J929 Pleural plaque without asbestos: Secondary | ICD-10-CM | POA: Diagnosis not present

## 2021-07-31 DIAGNOSIS — K3189 Other diseases of stomach and duodenum: Secondary | ICD-10-CM | POA: Diagnosis not present

## 2021-07-31 DIAGNOSIS — Z20822 Contact with and (suspected) exposure to covid-19: Secondary | ICD-10-CM | POA: Diagnosis not present

## 2021-07-31 DIAGNOSIS — K439 Ventral hernia without obstruction or gangrene: Secondary | ICD-10-CM | POA: Diagnosis not present

## 2021-07-31 DIAGNOSIS — C7910 Secondary malignant neoplasm of unspecified urinary organs: Secondary | ICD-10-CM | POA: Diagnosis not present

## 2021-07-31 DIAGNOSIS — Z452 Encounter for adjustment and management of vascular access device: Secondary | ICD-10-CM | POA: Diagnosis not present

## 2021-07-31 DIAGNOSIS — J432 Centrilobular emphysema: Secondary | ICD-10-CM | POA: Diagnosis not present

## 2021-07-31 DIAGNOSIS — C679 Malignant neoplasm of bladder, unspecified: Secondary | ICD-10-CM | POA: Diagnosis not present

## 2021-07-31 DIAGNOSIS — I2510 Atherosclerotic heart disease of native coronary artery without angina pectoris: Secondary | ICD-10-CM | POA: Diagnosis not present

## 2021-08-03 DIAGNOSIS — Z20822 Contact with and (suspected) exposure to covid-19: Secondary | ICD-10-CM | POA: Diagnosis not present

## 2021-08-07 ENCOUNTER — Ambulatory Visit (INDEPENDENT_AMBULATORY_CARE_PROVIDER_SITE_OTHER): Payer: Medicare Other | Admitting: Gastroenterology

## 2021-08-07 ENCOUNTER — Other Ambulatory Visit: Payer: Self-pay

## 2021-08-07 ENCOUNTER — Encounter: Payer: Self-pay | Admitting: Gastroenterology

## 2021-08-07 VITALS — BP 153/69 | HR 97 | Temp 98.0°F | Wt 211.0 lb

## 2021-08-07 DIAGNOSIS — Z86010 Personal history of colonic polyps: Secondary | ICD-10-CM

## 2021-08-07 DIAGNOSIS — D509 Iron deficiency anemia, unspecified: Secondary | ICD-10-CM | POA: Diagnosis not present

## 2021-08-07 NOTE — Progress Notes (Signed)
?  ?Jonathon Bellows MD, MRCP(U.K) ?[address]  ?[address]  ?[place], [place] [address]  ?Main: [phone]  ?Fax: [phone] ? ? ?Primary Care Physician: [name], NP ? ?Primary Gastroenterologist:  Dr. Jonathon Bellows  ? ?Chief Complaint  ?Patient presents with  ? IDA  ? ? ?HPI: Angela Russell is a 71 y.o. female ? ?Initially seen back in 2020 for constipation.  History of cystectomy for bladder mass.  At that time she had a microcytic anemia and had issues with constipation.  Plan was to perform endoscopy evaluation obtain IV iron check B12 folate and commence on Linzess. ?She was positive H. pylori and was advised to get treatment for it and antibiotics were prescribed.  She did not obtain her endoscopy evaluation.She is receiving IV iron with Dr. Tasia Catchings. ? ?01/24/2021: Hemoglobin 9.3 g, MCV 79 ferritin normal iron studies normal creatinine 1.88.  Hemoglobin stable around 9 gFor over 2 years ?02/08/2021: H. pylori test negative ?03/17/2021 colonoscopy internal hemorrhoids found during retroflexion 10 sessile polyps in the ascending colon resected 2 sessile polyps in the transverse colon and 2 in the sigmoid colon.  All subcentimeter in size.EGD normal.  8 out of 10 fragments were adenomatous.  ? ?02/16/2021 hemoglobin 10.4 g with MCV of 79, CMP creatinine 1.39 ? ?Interval history  05/10/2021-08/07/2021 ?  ?06/29/2021: Capsule study : small proximal small bowel AVM, non bleeding, capsule didn't exit ?  ? ?She is doing well no complaints ? ?Current Outpatient Medications  ?Medication Sig Dispense Refill  ? amLODipine (NORVASC) 5 MG tablet Take 1 tablet (5 mg total) by mouth daily. 90 tablet 1  ? diclofenac sodium (VOLTAREN) 1 % GEL Apply 2 g topically 4 (four) times daily. (Patient taking differently: Apply 2 g topically as needed.) 100 g 2  ? docusate sodium (COLACE) 100 MG capsule Take 1 capsule (100 mg total) by mouth daily. 90 capsule 0  ? lidocaine-prilocaine (EMLA) cream Apply 1 application topically as  needed. 30 g 3  ? losartan (COZAAR) 25 MG tablet Take 1 tablet (25 mg total) by mouth daily. 90 tablet 1  ? pembrolizumab (KEYTRUDA) 100 MG/4ML SOLN Inject 200 mg into the vein. Every 42 days,    Last txy 4/05, Next 5/03    ? Semaglutide (RYBELSUS) 3 MG TABS Take 1 tablet by mouth daily. 90 tablet 1  ? senna-docusate (SENOKOT-S) 8.6-50 MG tablet Take 1 tablet by mouth daily. 30 tablet 0  ? ?No current facility-administered medications for this visit.  ? ?Facility-Administered Medications Ordered in Other Visits  ?Medication Dose Route Frequency Provider Last Rate Last Admin  ? heparin lock flush 100 UNIT/ML injection           ? ? ?Allergies as of 08/07/2021  ? (No Known Allergies)  ? ? ?ROS: ? ?General: Negative for anorexia, weight loss, fever, chills, fatigue, weakness. ?ENT: Negative for hoarseness, difficulty swallowing , nasal congestion. ?CV: Negative for chest pain, angina, palpitations, dyspnea on exertion, peripheral edema.  ?Respiratory: Negative for dyspnea at rest, dyspnea on exertion, cough, sputum, wheezing.  ?GI: See history of present illness. ?GU:  Negative for dysuria, hematuria, urinary incontinence, urinary frequency, nocturnal urination.  ?Endo: Negative for unusual weight change.  ?  ?Physical Examination: ? ? BP (!) 153/69   Pulse 97   Temp 98 ?F (36.7 ?C) (Oral)   Wt 211 lb (95.7 kg)   BMI 37.38 kg/m?  ? ?General: Well-nourished, well-developed in no acute distress.  ?Eyes: No icterus. Conjunctivae pink. ?  Neuro: Alert and oriented x 3.  Grossly intact. ?Skin: Warm and dry, no jaundice.   ?Psych: Alert and cooperative, normal mood and affect. ? ? ?Imaging Studies: ?CT CHEST ABDOMEN PELVIS WO CONTRAST ? ?Result Date: 07/31/2021 ?CLINICAL DATA:  Urothelial cancer, metastatic bladder cancer restaging, status post cystectomy and ileal conduit urinary diversion. EXAM: CT CHEST, ABDOMEN AND PELVIS WITHOUT CONTRAST TECHNIQUE: Multidetector CT imaging of the chest, abdomen and pelvis was performed  following the standard protocol without IV contrast. RADIATION DOSE REDUCTION: This exam was performed according to the departmental dose-optimization program which includes automated exposure control, adjustment of the mA and/or kV according to patient size and/or use of iterative reconstruction technique. COMPARISON:  Multiple priors including most recent April 10, 2021 FINDINGS: CT CHEST FINDINGS Cardiovascular: Partially visualized central venous catheter with tip in the right atrium. Aortic atherosclerosis without aneurysmal dilation. Coronary artery calcifications. Normal size heart. No significant pericardial effusion/thickening. Mediastinum/Nodes: No supraclavicular adenopathy. No discrete thyroid nodule. Prominent mediastinal and hilar lymph nodes not pathologically enlarged by size criteria. No pathologically enlarged axillary lymph nodes. Trachea and esophagus are grossly unremarkable. Lungs/Pleura: Mild centrilobular emphysema. Mild diffuse bronchial wall thickening. No suspicious pulmonary nodules or masses. No pleural effusion. No pneumothorax. Musculoskeletal: Multilevel degenerative changes spine. No aggressive lytic or blastic lesion of bone. CT ABDOMEN PELVIS FINDINGS Hepatobiliary: No suspicious hepatic lesion on this noncontrast examination. Gallbladder is unremarkable. No biliary ductal dilation. Pancreas: No pancreatic ductal dilation or evidence of acute inflammation. Spleen: No splenomegaly or focal splenic lesion. Adrenals/Urinary Tract: Bilateral adrenal glands appear normal. No hydronephrosis. No nephrolithiasis. No contour deforming renal masses. Postsurgical change of prior cystectomy with right lower quadrant ileal conduit urinary diversion, the mild stranding and nodularity within the mesentery of the conduit is similar dating back to at least April 26, 2020 and favored postsurgical. No new suspicious soft tissue in the cystectomy bed. Stomach/Bowel: Radiopaque enteric contrast  material traverses distal loops of small bowel. Stomach is mildly distended without wall thickening. No pathologic dilation of small or large bowel. Right lower quadrant enterotomy sutures related to prior urinary diversion. No evidence of acute bowel inflammation. Vascular/Lymphatic: Aortic and branch vessel atherosclerosis without abdominal aortic aneurysm. Right common/external iliac vein stent. No pathologically enlarged abdominal or pelvic lymph nodes. Reproductive: Status post hysterectomy. No adnexal masses. Other: Trace pelvic free fluid decreased from prior examinations. Fat and nonobstructed small bowel containing peristomal hernia. Musculoskeletal: No aggressive lytic or blastic lesion of bone. Multilevel degenerative changes spine. Degenerative change of the hips. * onc * IMPRESSION: 1. Postsurgical change of prior cystectomy with right lower quadrant ileal conduit urinary diversion. 2. No evidence of recurrent or metastatic disease within the chest, abdomen, or pelvis. 3. Fat and nonobstructed small bowel containing peristomal hernia. 4. Mild centrilobular emphysema with diffuse bronchial wall thickening, suggesting COPD. 5. Aortic Atherosclerosis (ICD10-I70.0) and Emphysema (ICD10-J43.9). Electronically Signed   By: Dahlia Bailiff M.D.   On: 07/31/2021 14:06   ? ?Assessment and Plan:  ? ?KAMDYN COVEL is a 71 y.o. y/o female  here to follow-up for iron deficiency anemia.  EGD was normal.  Colonoscopy showed about 12 polyps 8 of which were adenomas.  Status post treatment for H. pylori.  Celiac serology negative. Capsule study of small bowel showed single small non bleeding AVM in proximal small bowel ,capsule didn't exit small bowel at end of study  ? ?Plan  ?Follow up with hematology : If future iron studies show a drop and if not responding to  iron will need 12 hour capsule study to evaluate entire small bowel and based on result will decide on type of intervention such as enteroscopy .  ?Personal  history of colon polyps > 10 - repeat colonoscopy in 1 year.  Offered her genetic testing but she declined ? ?Dr Jonathon Bellows  MD,MRCP [facility]) ?Follow up in as needed   ?

## 2021-08-08 ENCOUNTER — Encounter: Payer: Self-pay | Admitting: Oncology

## 2021-08-08 ENCOUNTER — Inpatient Hospital Stay: Payer: Medicare Other | Attending: Oncology

## 2021-08-08 ENCOUNTER — Other Ambulatory Visit: Payer: Self-pay

## 2021-08-08 ENCOUNTER — Inpatient Hospital Stay: Payer: Medicare Other

## 2021-08-08 ENCOUNTER — Inpatient Hospital Stay (HOSPITAL_BASED_OUTPATIENT_CLINIC_OR_DEPARTMENT_OTHER): Payer: Medicare Other | Admitting: Oncology

## 2021-08-08 VITALS — BP 137/74 | HR 79 | Temp 96.8°F | Resp 18 | Wt 213.0 lb

## 2021-08-08 DIAGNOSIS — D649 Anemia, unspecified: Secondary | ICD-10-CM | POA: Insufficient documentation

## 2021-08-08 DIAGNOSIS — Z86718 Personal history of other venous thrombosis and embolism: Secondary | ICD-10-CM | POA: Insufficient documentation

## 2021-08-08 DIAGNOSIS — I129 Hypertensive chronic kidney disease with stage 1 through stage 4 chronic kidney disease, or unspecified chronic kidney disease: Secondary | ICD-10-CM | POA: Insufficient documentation

## 2021-08-08 DIAGNOSIS — N1832 Chronic kidney disease, stage 3b: Secondary | ICD-10-CM

## 2021-08-08 DIAGNOSIS — Z1329 Encounter for screening for other suspected endocrine disorder: Secondary | ICD-10-CM

## 2021-08-08 DIAGNOSIS — C7910 Secondary malignant neoplasm of unspecified urinary organs: Secondary | ICD-10-CM

## 2021-08-08 DIAGNOSIS — Z5112 Encounter for antineoplastic immunotherapy: Secondary | ICD-10-CM

## 2021-08-08 DIAGNOSIS — J432 Centrilobular emphysema: Secondary | ICD-10-CM | POA: Insufficient documentation

## 2021-08-08 DIAGNOSIS — C679 Malignant neoplasm of bladder, unspecified: Secondary | ICD-10-CM

## 2021-08-08 DIAGNOSIS — N183 Chronic kidney disease, stage 3 unspecified: Secondary | ICD-10-CM | POA: Insufficient documentation

## 2021-08-08 DIAGNOSIS — E1122 Type 2 diabetes mellitus with diabetic chronic kidney disease: Secondary | ICD-10-CM | POA: Diagnosis not present

## 2021-08-08 DIAGNOSIS — E785 Hyperlipidemia, unspecified: Secondary | ICD-10-CM | POA: Insufficient documentation

## 2021-08-08 DIAGNOSIS — D631 Anemia in chronic kidney disease: Secondary | ICD-10-CM | POA: Diagnosis not present

## 2021-08-08 DIAGNOSIS — M7989 Other specified soft tissue disorders: Secondary | ICD-10-CM | POA: Diagnosis not present

## 2021-08-08 DIAGNOSIS — Z7901 Long term (current) use of anticoagulants: Secondary | ICD-10-CM | POA: Insufficient documentation

## 2021-08-08 DIAGNOSIS — Z79899 Other long term (current) drug therapy: Secondary | ICD-10-CM | POA: Diagnosis not present

## 2021-08-08 DIAGNOSIS — Z8719 Personal history of other diseases of the digestive system: Secondary | ICD-10-CM | POA: Diagnosis not present

## 2021-08-08 DIAGNOSIS — R635 Abnormal weight gain: Secondary | ICD-10-CM

## 2021-08-08 LAB — COMPREHENSIVE METABOLIC PANEL
ALT: 13 U/L (ref 0–44)
AST: 21 U/L (ref 15–41)
Albumin: 3.9 g/dL (ref 3.5–5.0)
Alkaline Phosphatase: 112 U/L (ref 38–126)
Anion gap: 9 (ref 5–15)
BUN: 31 mg/dL — ABNORMAL HIGH (ref 8–23)
CO2: 22 mmol/L (ref 22–32)
Calcium: 8.9 mg/dL (ref 8.9–10.3)
Chloride: 108 mmol/L (ref 98–111)
Creatinine, Ser: 1.54 mg/dL — ABNORMAL HIGH (ref 0.44–1.00)
GFR, Estimated: 36 mL/min — ABNORMAL LOW (ref >60)
Glucose, Bld: 146 mg/dL — ABNORMAL HIGH (ref 70–99)
Potassium: 4.3 mmol/L (ref 3.5–5.1)
Sodium: 139 mmol/L (ref 135–145)
Total Bilirubin: 0.5 mg/dL (ref 0.3–1.2)
Total Protein: 7.6 g/dL (ref 6.5–8.1)

## 2021-08-08 LAB — CBC WITH DIFFERENTIAL/PLATELET
Abs Immature Granulocytes: 0.03 10*3/uL (ref 0.00–0.07)
Basophils Absolute: 0 10*3/uL (ref 0.0–0.1)
Basophils Relative: 1 %
Eosinophils Absolute: 0.1 10*3/uL (ref 0.0–0.5)
Eosinophils Relative: 2 %
HCT: 32.8 % — ABNORMAL LOW (ref 36.0–46.0)
Hemoglobin: 10.1 g/dL — ABNORMAL LOW (ref 12.0–15.0)
Immature Granulocytes: 1 %
Lymphocytes Relative: 26 %
Lymphs Abs: 1.5 10*3/uL (ref 0.7–4.0)
MCH: 24.3 pg — ABNORMAL LOW (ref 26.0–34.0)
MCHC: 30.8 g/dL (ref 30.0–36.0)
MCV: 79 fL — ABNORMAL LOW (ref 80.0–100.0)
Monocytes Absolute: 0.5 10*3/uL (ref 0.1–1.0)
Monocytes Relative: 8 %
Neutro Abs: 3.6 10*3/uL (ref 1.7–7.7)
Neutrophils Relative %: 62 %
Platelets: 227 10*3/uL (ref 150–400)
RBC: 4.15 MIL/uL (ref 3.87–5.11)
RDW: 14.4 % (ref 11.5–15.5)
WBC: 5.8 10*3/uL (ref 4.0–10.5)
nRBC: 0 % (ref 0.0–0.2)

## 2021-08-08 LAB — TSH: TSH: 4.813 u[IU]/mL — ABNORMAL HIGH (ref 0.350–4.500)

## 2021-08-08 LAB — T4, FREE: Free T4: 1 ng/dL (ref 0.61–1.12)

## 2021-08-08 MED ORDER — SODIUM CHLORIDE 0.9 % IV SOLN
Freq: Once | INTRAVENOUS | Status: AC
  Filled 2021-08-08: qty 250

## 2021-08-08 MED ORDER — HEPARIN SOD (PORK) LOCK FLUSH 100 UNIT/ML IV SOLN
500.0000 [IU] | Freq: Once | INTRAVENOUS | Status: AC | PRN
  Administered 2021-08-08: 500 [IU]
  Filled 2021-08-08: qty 5

## 2021-08-08 MED ORDER — SODIUM CHLORIDE 0.9% FLUSH
10.0000 mL | INTRAVENOUS | Status: DC | PRN
  Administered 2021-08-08: 10 mL via INTRAVENOUS
  Filled 2021-08-08: qty 10

## 2021-08-08 MED ORDER — HEPARIN SOD (PORK) LOCK FLUSH 100 UNIT/ML IV SOLN
500.0000 [IU] | Freq: Once | INTRAVENOUS | Status: DC
  Filled 2021-08-08: qty 5

## 2021-08-08 MED ORDER — SODIUM CHLORIDE 0.9 % IV SOLN
200.0000 mg | Freq: Once | INTRAVENOUS | Status: AC
  Administered 2021-08-08: 200 mg via INTRAVENOUS
  Filled 2021-08-08: qty 8

## 2021-08-08 NOTE — Progress Notes (Signed)
Pt here for follow up. No new concerns voiced.   

## 2021-08-08 NOTE — Progress Notes (Signed)
°Hematology/Oncology follow up note °[phone] [phone] ° ° °Patient Care Team: °Holdsworth, Karen, NP as PCP - General °Nice, Keith B, OD (Optometry) °Becky Colan, MD as Consulting Physician (Hematology and Oncology) °Harris, Julie S, RPH (Inactive) (Pharmacist) ° ° °CHIEF COMPLAINTS/REASON FOR VISIT:  °Follow up for bladder cancer, anemia.  ° °HISTORY OF PRESENTING ILLNESS:  °Angela Russell is a  71 y.o.  female with PMH listed below who was referred to me for evaluation of newly diagnosed bladder cancer. °Patient initially presented to emergency room at the end of January 2020 for evaluation of dysuria, hematuria and the left lower quadrant inguinal pain and flank pain.  1/28 2020 CT renal stone study showed suspected irregular wall thickening about the superior bladder, not well assessed due to degree of bladder distention.  Recommend cystoscopy for further evaluation.  No renal stone or obstructive uropathy.  Patient was given IV Rocephin and referred patient for outpatient urology follow-up.  Urine culture was negative.  She again presented to ER after 2 days with similar symptoms.  °Patient has 25-pack-year smoking history, quit approximately 20 years ago.  No family history of any urology malignancies °07/03/2018 another CT abdomen pelvis with contrast was done which showed no nephrolithiasis or hydronephrosis is identified.  Bladder is decompressed limiting evaluation. ° 07/16/2018.urology Dr. Sinskey - cystoscopy and bilateral retrograde pyelogram on 07/16/2018.  Pyelogram did not show any filling defect or abnormalities.  No hydronephrosis.  Ureteral orifice was not involved with tumor.  There is a large 5 cm posterior wall bladder tumor, bullous and sessile appearing.  Patient underwent TURBT.  ° °Pathology: High-grade urothelial carcinoma, invasive into muscularis propria.  Lymphovascular invasion is present.  Carcinoma in situ is also identified.  Focal squamous differentiation is  noted, areas of invasive carcinoma display pleomorphic/sarcomatoid changes.  T2b ° °08/07/2018 CT without contrast negative.  2 subpleural right upper lobe nodule 2 to 3 mm likely benign. °She also had baseline audiometry done.  °08/04/2018 ddMVAC x 1 cycle, stopped due to intolerance and AKI. ° °Patient received 1 cycle of dd MVAC, not able to tolerate due to AKI. °Patient then was referred to [facility]  °09/22/2018 patient underwent a cystectomy, pathology pT3a N0 Mx.  11 lymph nodes were harvested and was all negative. °Invasive urothelia carcinoma, high grade, with sarcomatoid features.  ° °10/14/2018 patient was admitted due to pyelonephritis and a pelvic fluid collection.  Drain was placed. °10/23/2018 drain was removed. °Patient has had difficulties getting to her appointments to [facility]. °02/09/2019, patient presented with abdominal pain. °CT concerning for small bowel obstruction and increased size of right pelvic fluid collection concerning for cancer recurrence.  Right hydronephrosis to the level of pelvis and enlarged lymph nodes. °Patient had JP drain placed with CT guidance to pelvic fluid collection and drained 400 cc amber fluid.  Culture was negative for growth of microorganisms and cytology was negative for malignancy.-JP drain was removed on the day of discharge. °CT-guided core biopsy of pelvic lymph node adenopathy was attempted but not successful. ° °# Right lower extremity DVT, provoked by Transvenous biopsy  °02/26/2019 transvenous biopsy by IR unsuccessful transvenous biopsy of the right pelvis mass. °Post biopsy acute thrombus in the right external iliac and common femoral vein.  Patient was recommended to start anticoagulation with Xarelto however due to the co-pay, patient is not able to afford the medication. °Anticoagulation regimen was switched to Eliquis 5 mg.  Patient reports that she has been taking it once a   day. 03/20/2019 PET showed FDG avid tissue in the cystectomy bed,  retroperitoneal and pelvic lymphadenopathy.  Right common iliac DVT.  # increased right lower extremity swelling.  She was started on Eliquis for anticoagulation by [facility].  We clarified with her pharmacy and she was actually taking Eliquis 2.5 mg twice daily. Right lower extremity swelling has not improved but instead worsened. 03/16/2019 She had ultrasound right lower extremity done which showed persistent extensive proximal right lower extremity DVT.  Anticoagulation regimen has increased to Eliquis 5 mg twice daily.  # establish care with [facility] oncology Dr. [name] for evaluation.  Dr. [name] recommended starting immunotherapy with PD-L1 inhibitor Pembrolizumab 200 mg every 3 weeks.  The sarcomatoid histology may not respond to chemotherapy well, could portend a better chance of response into immunotherapy PET scan after 4 cycles of Keytruda showed single mildly enlarged central mesenteric lymph node in the upper pelvis with SUV 9.6.  No other abnormal hypermetabolic activity was reported.  # #Right lower extremity  femoral vein DVT provoked by transvenous biopsy in the context of cancer recurrence, Status post IV C filter and mechanical thrombectomy.  IVC filter has been removed. She is currently off anticoagulation after she developed hematuria.  # 03/30/2019 Status post IVC filter placement, mechanical thrombectomy.-IVC filter was retrieved in January 2021. # PD-L1 CPS 100%.  # 03/31/2019 started on immunotherapy Keytruda.  # 04/26/2020 CT chest abdomen pelvis was reviewed and discussed with patient. No definitive finding of disease recurrence or metastasis.  Small amount of ascites similar to prior. There is a parastomal hernia containing a small margin of adjacent small bowel.  This is associated with wall thickening in approximately 7 cm segment of bowel adjacent to the hernia.  There is adjacent abnormal stranding of the mesentery and omentum.  # 08/19/2020, CT chest abdomen pelvis  showed no evidence of new/progressive metastatic disease. NED Hold off treatment due to uncontrolled hypotension.  # Chronic abdominal discomfort due to hernia. 08/11/2020, patient was seen by Dr. Dolphus Jenny for parastomal hernia.  Patient has no obstructive symptoms and would like to continue to monitor her symptoms and hold off any intervention at this point.  Patient was recommended to wear ostomy hernia belt and was referred to [facility] wound ostomy nursing team.  04/10/2021 CT chest abdomen pelvis without contrast showed no evidence of disease recurrence.  Small volume fluid in the pelvis, decreased.  Emphysema/diffuse bilateral bronchial wall thickening.  CAD.  She received IV venofer previously and did not tolerate due to energized joint pain afterwards.  #Hernia -parastomal hernia.   She has establish care with [facility] hernia center.  She was recommended to monitor her symptoms and aware ostomy belt  INTERVAL HISTORY Angela Russell is a 71 y.o. female who has above history reviewed by me today presents for follow up visit for evaluation form metastatic high-grade urothelial carcinoma of the bladder. Sarcomatoid feature.  Patient has been on maintenance immunotherapy.   She is currently on Keytruda every 4 weeks per patient's request. She takes iron supplementation on and off.  No nausea vomiting diarrhea no abdominal pain.  Review of Systems  Constitutional:  Negative for appetite change, chills, fatigue and fever.  HENT:   Negative for hearing loss and voice change.   Eyes:  Negative for eye problems.  Respiratory:  Negative for chest tightness and cough.   Cardiovascular:  Negative for chest pain and leg swelling.  Gastrointestinal:  Negative for abdominal distention, abdominal pain, blood in stool and constipation.  Endocrine: Negative for hot flashes.  Genitourinary:  Negative for difficulty urinating and frequency.   Musculoskeletal:  Positive for arthralgias.  Skin:  Negative for itching  and rash.  Neurological:  Positive for numbness. Negative for extremity weakness.  Hematological:  Negative for adenopathy.  Psychiatric/Behavioral:  Negative for confusion. The patient is not nervous/anxious.    MEDICAL HISTORY:  Past Medical History:  Diagnosis Date   Carotid artery plaque, right 01/2014   CKD (chronic kidney disease)    stage 2-3   Controlled diabetes mellitus type 2 with complications (HCC)    Diabetes mellitus without complication (HCC)    DM (diabetes mellitus), type 2, uncontrolled    Hyperlipidemia    Hypertension    Hypochromic microcytic anemia    mild   Metastatic urothelial carcinoma ([place]) 03/23/2019   Osteoporosis    Personal history of colonic polyps    Renal insufficiency    Rotator cuff tendonitis, right     SURGICAL HISTORY: Past Surgical History:  Procedure Laterality Date   COLONOSCOPY WITH PROPOFOL N/A 03/17/2021   Procedure: COLONOSCOPY WITH PROPOFOL;  Surgeon: Jonathon Bellows, MD;  Location: [facility] END[facility]COPY;  Service: Gastroenterology;  Laterality: N/A;   CYSTOSCOPY W/ RETROGRADES Bilateral 07/16/2018   Procedure: CYSTOSCOPY WITH RETROGRADE PYELOGRAM;  Surgeon: Billey Co, MD;  Location: [facility] ORS;  Service: Urology;  Laterality: Bilateral;   ESOPHAGOGASTRODUODENOSCOPY  03/17/2021   Procedure: ESOPHAGOGASTRODUODENOSCOPY (EGD);  Surgeon: Jonathon Bellows, MD;  Location: [facility] ENDOSCOPY;  Service: Gastroenterology;;   GIVENS CAPSULE STUDY N/A 06/19/2021   Procedure: GIVENS CAPSULE STUDY;  Surgeon: Jonathon Bellows, MD;  Location: [facility] ENDOSCOPY;  Service: Gastroenterology;  Laterality: N/A;   IVC FILTER INSERTION N/A 03/30/2019   Procedure: IVC FILTER INSERTION;  Surgeon: Algernon Huxley, MD;  Location: [place] CV LAB;  Service: Cardiovascular;  Laterality: N/A;   IVC FILTER REMOVAL N/A 06/15/2019   Procedure: IVC FILTER REMOVAL;  Surgeon: Algernon Huxley, MD;  Location: [place] CV LAB;  Service: Cardiovascular;  Laterality: N/A;   KNEE SURGERY  Left 03/17/2013   torn meniscus   PERIPHERAL VASCULAR THROMBECTOMY Right 03/30/2019   Procedure: PERIPHERAL VASCULAR THROMBECTOMY;  Surgeon: Algernon Huxley, MD;  Location: [place] CV LAB;  Service: Cardiovascular;  Laterality: Right;   PORTA CATH INSERTION N/A 08/06/2018   Procedure: PORTA CATH INSERTION;  Surgeon: Algernon Huxley, MD;  Location: [place] CV LAB;  Service: Cardiovascular;  Laterality: N/A;   TOTAL ABDOMINAL HYSTERECTOMY  2000   due to bleeding and fibroids, partial- still has ovaries   TRANSURETHRAL RESECTION OF BLADDER TUMOR N/A 07/16/2018   Procedure: TRANSURETHRAL RESECTION OF BLADDER TUMOR (TURBT);  Surgeon: Billey Co, MD;  Location: [facility] ORS;  Service: Urology;  Laterality: N/A;    SOCIAL HISTORY: Social History   Socioeconomic History   Marital status: Single    Spouse name: Not on file   Number of children: 1   Years of education: Not on file   Highest education level: 10th grade  Occupational History   Not on file  Tobacco Use   Smoking status: Former    Types: Cigarettes    Quit date: 06/05/1991    Years since quitting: 30.1   Smokeless tobacco: Never  Vaping Use   Vaping Use: Never used  Substance and Sexual Activity   Alcohol use: No   Drug use: No   Sexual activity: Not Currently  Other Topics Concern   Not on file  Social History Narrative   Working  full time   Social Determinants of Health   Financial Resource Strain: Low Risk    Difficulty of Paying Living Expenses: Not hard at all  Food Insecurity: Not on file  Transportation Needs: Not on file  Physical Activity: Not on file  Stress: Not on file  Social Connections: Not on file  Intimate Partner Violence: Not on file    FAMILY HISTORY: Family History  Problem Relation Age of Onset   Heart disease Mother    Heart attack Mother    Arthritis Father    Diabetes Brother     ALLERGIES:  has No Known Allergies.  MEDICATIONS:  Current Outpatient Medications   Medication Sig Dispense Refill   amLODipine (NORVASC) 5 MG tablet Take 1 tablet (5 mg total) by mouth daily. 90 tablet 1   diclofenac sodium (VOLTAREN) 1 % GEL Apply 2 g topically 4 (four) times daily. (Patient taking differently: Apply 2 g topically as needed.) 100 g 2   docusate sodium (COLACE) 100 MG capsule Take 1 capsule (100 mg total) by mouth daily. 90 capsule 0   lidocaine-prilocaine (EMLA) cream Apply 1 application topically as needed. 30 g 3   losartan (COZAAR) 25 MG tablet Take 1 tablet (25 mg total) by mouth daily. 90 tablet 1   pembrolizumab (KEYTRUDA) 100 MG/4ML SOLN Inject 200 mg into the vein. Every 42 days,    Last txy 4/05, Next 5/03     Semaglutide (RYBELSUS) 3 MG TABS Take 1 tablet by mouth daily. 90 tablet 1   senna-docusate (SENOKOT-S) 8.6-50 MG tablet Take 1 tablet by mouth daily. 30 tablet 0   No current facility-administered medications for this visit.   Facility-Administered Medications Ordered in Other Visits  Medication Dose Route Frequency Provider Last Rate Last Admin   heparin lock flush 100 UNIT/ML injection              PHYSICAL EXAMINATION: ECOG PERFORMANCE STATUS: 1 - Symptomatic but completely ambulatory Vitals:   08/08/21 0837  BP: 137/74  Pulse: 79  Resp: 18  Temp: (!) 96.8 F (36 C)   Filed Weights   08/08/21 0837  Weight: 213 lb (96.6 kg)    Physical Exam Constitutional:      General: She is not in acute distress.    Comments: Walk independently  HENT:     Head: Normocephalic and atraumatic.  Eyes:     General: No scleral icterus.    Pupils: Pupils are equal, round, and reactive to light.  Cardiovascular:     Rate and Rhythm: Normal rate and regular rhythm.     Heart sounds: Normal heart sounds.  Pulmonary:     Effort: Pulmonary effort is normal. No respiratory distress.     Breath sounds: No wheezing.  Abdominal:     General: Bowel sounds are normal. There is no distension.     Palpations: Abdomen is soft. There is no mass.      Comments: Ureterostomy,   Musculoskeletal:        General: No deformity. Normal range of motion.     Cervical back: Normal range of motion and neck supple.  Skin:    General: Skin is warm and dry.     Coloration: Skin is not pale.     Findings: No erythema or rash.  Neurological:     Mental Status: She is alert and oriented to person, place, and time. Mental status is at baseline.     Cranial Nerves: No cranial nerve deficit.  Coordination: Coordination normal.  Psychiatric:        Mood and Affect: Mood normal.  .  RADIOGRAPHIC STUDIES: I have personally reviewed the radiological images as listed and agreed with the findings in the report.  CMP Latest Ref Rng & Units 08/08/2021  Glucose 70 - 99 mg/dL 146(H)  BUN 8 - 23 mg/dL 31(H)  Creatinine 0.44 - 1.00 mg/dL 1.54(H)  Sodium 135 - 145 mmol/L 139  Potassium 3.5 - 5.1 mmol/L 4.3  Chloride 98 - 111 mmol/L 108  CO2 22 - 32 mmol/L 22  Calcium 8.9 - 10.3 mg/dL 8.9  Total Protein 6.5 - 8.1 g/dL 7.6  Total Bilirubin 0.3 - 1.2 mg/dL 0.5  Alkaline Phos 38 - 126 U/L 112  AST 15 - 41 U/L 21  ALT 0 - 44 U/L 13   CBC Latest Ref Rng & Units 08/08/2021  WBC 4.0 - 10.5 K/uL 5.8  Hemoglobin 12.0 - 15.0 g/dL 10.1(L)  Hematocrit 36.0 - 46.0 % 32.8(L)  Platelets 150 - 400 K/uL 227    LABORATORY DATA:  I have reviewed the data as listed Lab Results  Component Value Date   WBC 5.8 08/08/2021   HGB 10.1 (L) 08/08/2021   HCT 32.8 (L) 08/08/2021   MCV 79.0 (L) 08/08/2021   PLT 227 08/08/2021   Recent Labs    06/13/21 0844 07/11/21 0816 07/14/21 0818 08/08/21 0821  NA 137 137 142 139  K 3.7 4.2 4.5 4.3  CL 107 106 104 108  CO2 $Re'22 22 23 22  'EKt$ GLUCOSE 135* 121* 114* 146*  BUN 26* 27* 27 31*  CREATININE 1.51* 1.58* 1.52* 1.54*  CALCIUM 9.0 9.0 9.3 8.9  GFRNONAA 37* 35*  --  36*  PROT 7.8 7.8 7.2 7.6  ALBUMIN 3.9 3.8 4.5 3.9  AST $Re'21 20 17 21  'rxW$ ALT $R'11 11 8 13  'vm$ ALKPHOS 114 99 123* 112  BILITOT 0.1* 0.5 0.2 0.5     Iron/TIBC/Ferritin/ %Sat    Component Value Date/Time   IRON 62 01/24/2021 0838   IRON 26 (L) 02/04/2019 1309   TIBC 315 01/24/2021 0838   TIBC 251 02/04/2019 1309   FERRITIN 239 01/24/2021 0838   FERRITIN 409 (H) 02/04/2019 1309   IRONPCTSAT 20 01/24/2021 0838   IRONPCTSAT 10 (L) 02/04/2019 1309    RADIOGRAPHIC STUDIES: I have personally reviewed the radiological images as listed and agreed with the findings in the report. CT CHEST ABDOMEN PELVIS WO CONTRAST  Result Date: 07/31/2021 CLINICAL DATA:  Urothelial cancer, metastatic bladder cancer restaging, status post cystectomy and ileal conduit urinary diversion. EXAM: CT CHEST, ABDOMEN AND PELVIS WITHOUT CONTRAST TECHNIQUE: Multidetector CT imaging of the chest, abdomen and pelvis was performed following the standard protocol without IV contrast. RADIATION DOSE REDUCTION: This exam was performed according to the departmental dose-optimization program which includes automated exposure control, adjustment of the mA and/or kV according to patient size and/or use of iterative reconstruction technique. COMPARISON:  Multiple priors including most recent April 10, 2021 FINDINGS: CT CHEST FINDINGS Cardiovascular: Partially visualized central venous catheter with tip in the right atrium. Aortic atherosclerosis without aneurysmal dilation. Coronary artery calcifications. Normal size heart. No significant pericardial effusion/thickening. Mediastinum/Nodes: No supraclavicular adenopathy. No discrete thyroid nodule. Prominent mediastinal and hilar lymph nodes not pathologically enlarged by size criteria. No pathologically enlarged axillary lymph nodes. Trachea and esophagus are grossly unremarkable. Lungs/Pleura: Mild centrilobular emphysema. Mild diffuse bronchial wall thickening. No suspicious pulmonary nodules or masses. No pleural effusion. No pneumothorax. Musculoskeletal: Multilevel  degenerative changes spine. No aggressive lytic or blastic lesion  of bone. CT ABDOMEN PELVIS FINDINGS Hepatobiliary: No suspicious hepatic lesion on this noncontrast examination. Gallbladder is unremarkable. No biliary ductal dilation. Pancreas: No pancreatic ductal dilation or evidence of acute inflammation. Spleen: No splenomegaly or focal splenic lesion. Adrenals/Urinary Tract: Bilateral adrenal glands appear normal. No hydronephrosis. No nephrolithiasis. No contour deforming renal masses. Postsurgical change of prior cystectomy with right lower quadrant ileal conduit urinary diversion, the mild stranding and nodularity within the mesentery of the conduit is similar dating back to at least April 26, 2020 and favored postsurgical. No new suspicious soft tissue in the cystectomy bed. Stomach/Bowel: Radiopaque enteric contrast material traverses distal loops of small bowel. Stomach is mildly distended without wall thickening. No pathologic dilation of small or large bowel. Right lower quadrant enterotomy sutures related to prior urinary diversion. No evidence of acute bowel inflammation. Vascular/Lymphatic: Aortic and branch vessel atherosclerosis without abdominal aortic aneurysm. Right common/external iliac vein stent. No pathologically enlarged abdominal or pelvic lymph nodes. Reproductive: Status post hysterectomy. No adnexal masses. Other: Trace pelvic free fluid decreased from prior examinations. Fat and nonobstructed small bowel containing peristomal hernia. Musculoskeletal: No aggressive lytic or blastic lesion of bone. Multilevel degenerative changes spine. Degenerative change of the hips. * onc * IMPRESSION: 1. Postsurgical change of prior cystectomy with right lower quadrant ileal conduit urinary diversion. 2. No evidence of recurrent or metastatic disease within the chest, abdomen, or pelvis. 3. Fat and nonobstructed small bowel containing peristomal hernia. 4. Mild centrilobular emphysema with diffuse bronchial wall thickening, suggesting COPD. 5. Aortic  Atherosclerosis (ICD10-I70.0) and Emphysema (ICD10-J43.9). Electronically Signed   By: Dahlia Bailiff M.D.   On: 07/31/2021 14:06      ASSESSMENT & PLAN:  1. Encounter for antineoplastic immunotherapy   2. Metastatic urothelial carcinoma ([place])   3. Anemia in stage 3b chronic kidney disease (HCC)   4. Stage 3b chronic kidney disease ([place])    Cancer Staging  Bladder carcinoma [facility]) Staging form: Urinary Bladder, AJCC 8th Edition - Clinical stage from 08/01/2018: Stage IVA (cTX, cN3, cM1a) - Signed by [name], MD on 04/21/2019  #Metastatic urothelial carcinoma of bladder, sarcomatoid features pelvic sidewall mass biopsy showed metastatic carcinoma consistent with involvement by urothelial carcinoma., Stage IV NED- since 01/2020 Labs reviewed and discussed with patient. Proceed with Keytruda. 07/31/2021, CT chest abdomen pelvis without contrast showed NED. Chronic finding was reviewed with patient.  # Chronic kidney disease, stable creatinine today.  Avoid nephrotoxin.  Encourage hydration. #Chronic anemia secondary to stage III CKD.  Hemoglobin 10.1 today.  Recommend oral iron supplementation. Patient declines IV Venofer due to side effects.  Hemoglobin is stable.  #Follow-up in 4 weeks for repeat blood work, MD evaluation and Keytruda .  Patient prefers every 4 weeks treatment  .All questions were answered. The patient knows to call the clinic with any problems questions or concerns.  [name], MD, PhD 08/08/21

## 2021-08-08 NOTE — Patient Instructions (Signed)
[facility]  Discharge Instructions: ?Thank you for choosing [facility] to provide your oncology and hematology care.  ?If you have a lab appointment with the Cancer Center, please go directly to the Cancer Center and check in at the registration area. ? ?Wear comfortable clothing and clothing appropriate for easy access to any Portacath or PICC line.  ? ?We strive to give you quality time with your provider. You may need to reschedule your appointment if you arrive late (15 or more minutes).  Arriving late affects you and other patients whose appointments are after yours.  Also, if you miss three or more appointments without notifying the office, you may be dismissed from the clinic at the provider?s discretion.    ?  ?For prescription refill requests, have your pharmacy contact our office and allow 72 hours for refills to be completed.   ? ?Today you received the following chemotherapy and/or immunotherapy agents KEYTRUDA ?    ?  ?To help prevent nausea and vomiting after your treatment, we encourage you to take your nausea medication as directed. ? ?BELOW ARE SYMPTOMS THAT SHOULD BE REPORTED IMMEDIATELY: ?*FEVER GREATER THAN 100.4 F (38 ?C) OR HIGHER ?*CHILLS OR SWEATING ?*NAUSEA AND VOMITING THAT IS NOT CONTROLLED WITH YOUR NAUSEA MEDICATION ?*UNUSUAL SHORTNESS OF BREATH ?*UNUSUAL BRUISING OR BLEEDING ?*URINARY PROBLEMS (pain or burning when urinating, or frequent urination) ?*BOWEL PROBLEMS (unusual diarrhea, constipation, pain near the anus) ?TENDERNESS IN MOUTH AND THROAT WITH OR WITHOUT PRESENCE OF ULCERS (sore throat, sores in mouth, or a toothache) ?UNUSUAL RASH, SWELLING OR PAIN  ?UNUSUAL VAGINAL DISCHARGE OR ITCHING  ? ?Items with * indicate a potential emergency and should be followed up as soon as possible or go to the Emergency Department if any problems should occur. ? ?Please show the CHEMOTHERAPY ALERT CARD or IMMUNOTHERAPY ALERT CARD at check-in to  the Emergency Department and triage nurse. ? ?Should you have questions after your visit or need to cancel or reschedule your appointment, please contact [facility]  [phone] and follow the prompts.  Office hours are 8:00 a.m. to 4:30 p.m. Monday - Friday. Please note that voicemails left after 4:00 p.m. may not be returned until the following business day.  We are closed weekends and major holidays. You have access to a nurse at all times for urgent questions. Please call the main number to the clinic [phone] and follow the prompts. ? ?For any non-urgent questions, you may also contact your provider using MyChart. We now offer e-Visits for anyone 18 and older to request care online for non-urgent symptoms. For details visit [email or web address]. ?  ?Also download the MyChart app! Go to the app store, search "MyChart", open the app, select [facility], and log in with your MyChart username and password. ? ?Due to Covid, a mask is required upon entering the hospital/clinic. If you do not have a mask, one will be given to you upon arrival. For doctor visits, patients may have 1 support person aged 18 or older with them. For treatment visits, patients cannot have anyone with them due to current Covid guidelines and our immunocompromised population.  ? ?Pembrolizumab injection ?What is this medication? ?PEMBROLIZUMAB (pem broe liz ue mab) is a monoclonal antibody. It is used to treat certain types of cancer. ?This medicine may be used for other purposes; ask your health care provider or pharmacist if you have questions. ?COMMON BRAND NAME(S): Keytruda ?What should I tell my care   team before I take this medication? ?They need to know if you have any of these conditions: ?autoimmune diseases like Crohn's disease, ulcerative colitis, or lupus ?have had or planning to have an allogeneic stem cell transplant (uses someone else's stem cells) ?history of organ transplant ?history  of chest radiation ?nervous system problems like myasthenia gravis or Guillain-Barre syndrome ?an unusual or allergic reaction to pembrolizumab, other medicines, foods, dyes, or preservatives ?pregnant or trying to get pregnant ?breast-feeding ?How should I use this medication? ?This medicine is for infusion into a vein. It is given by a health care professional in a hospital or clinic setting. ?A special MedGuide will be given to you before each treatment. Be sure to read this information carefully each time. ?Talk to your pediatrician regarding the use of this medicine in children. While this drug may be prescribed for children as young as 6 months for selected conditions, precautions do apply. ?Overdosage: If you think you have taken too much of this medicine contact a poison control center or emergency room at once. ?NOTE: This medicine is only for you. Do not share this medicine with others. ?What if I miss a dose? ?It is important not to miss your dose. Call your doctor or health care professional if you are unable to keep an appointment. ?What may interact with this medication? ?Interactions have not been studied. ?This list may not describe all possible interactions. Give your health care provider a list of all the medicines, herbs, non-prescription drugs, or dietary supplements you use. Also tell them if you smoke, drink alcohol, or use illegal drugs. Some items may interact with your medicine. ?What should I watch for while using this medication? ?Your condition will be monitored carefully while you are receiving this medicine. ?You may need blood work done while you are taking this medicine. ?Do not become pregnant while taking this medicine or for 4 months after stopping it. Women should inform their doctor if they wish to become pregnant or think they might be pregnant. There is a potential for serious side effects to an unborn child. Talk to your health care professional or pharmacist for more  information. Do not breast-feed an infant while taking this medicine or for 4 months after the last dose. ?What side effects may I notice from receiving this medication? ?Side effects that you should report to your doctor or health care professional as soon as possible: ?allergic reactions like skin rash, itching or hives, swelling of the face, lips, or tongue ?bloody or black, tarry ?breathing problems ?changes in vision ?chest pain ?chills ?confusion ?constipation ?cough ?diarrhea ?dizziness or feeling faint or lightheaded ?fast or irregular heartbeat ?fever ?flushing ?joint pain ?low blood counts - this medicine may decrease the number of white blood cells, red blood cells and platelets. You may be at increased risk for infections and bleeding. ?muscle pain ?muscle weakness ?pain, tingling, numbness in the hands or feet ?persistent headache ?redness, blistering, peeling or loosening of the skin, including inside the mouth ?signs and symptoms of high blood sugar such as dizziness; dry mouth; dry skin; fruity breath; nausea; stomach pain; increased hunger or thirst; increased urination ?signs and symptoms of kidney injury like trouble passing urine or change in the amount of urine ?signs and symptoms of liver injury like dark urine, light-colored stools, loss of appetite, nausea, right upper belly pain, yellowing of the eyes or skin ?sweating ?swollen lymph nodes ?weight loss ?Side effects that usually do not require medical attention (report to your doctor   or health care professional if they continue or are bothersome): ?decreased appetite ?hair loss ?tiredness ?This list may not describe all possible side effects. Call your doctor for medical advice about side effects. You may report side effects to FDA at 1-800-FDA-1088. ?Where should I keep my medication? ?This drug is given in a hospital or clinic and will not be stored at home. ?NOTE: This sheet is a summary. It may not cover all possible information. If you  have questions about this medicine, talk to your doctor, pharmacist, or health care provider. ?? 2022 Elsevier/Gold Standard (2021-02-07 00:00:00) ? ?

## 2021-08-10 ENCOUNTER — Other Ambulatory Visit: Payer: Self-pay | Admitting: Nurse Practitioner

## 2021-08-10 DIAGNOSIS — Z20828 Contact with and (suspected) exposure to other viral communicable diseases: Secondary | ICD-10-CM | POA: Diagnosis not present

## 2021-08-11 ENCOUNTER — Encounter: Payer: Self-pay | Admitting: Oncology

## 2021-08-11 NOTE — Addendum Note (Signed)
Addended by: [name] on: 08/11/2021 09:31 PM ? ? Modules accepted: Orders ? ?

## 2021-08-18 ENCOUNTER — Telehealth: Payer: Self-pay

## 2021-08-18 NOTE — Chronic Care Management (AMB) (Signed)
? ? ?  Chronic Care Management ?Pharmacy Assistant  ? ?Name: Angela Russell  MRN: [id number] DOB: 01/04/1951 ? ?Reason for Encounter: Disease State Diabetes Mellitus ?  ? ?Recent office visits:  ?07/14/21-[name] Dad, NP (PCP) Seen for general follow up visit. Labs ordered. Follow up in 6 months. ? ?Recent consult visits:  ?08/08/21-[name], MD (Oncology) Follow up for bladder cancer, anemia. Follow up in 4 weeks.  ?08/07/21-[name], MD (Gastroenterology) Seen for Iron deficiency anemia. Return if symptoms worsen or fail to improve. ?07/11/21-[name], MD (Oncology) Follow up for bladder cancer, anemia. Follow up in 4 weeks. ?06/13/21-[name], MD (Oncology) Follow up for bladder cancer, anemia. Follow up in 4 weeks. ?05/16/21-[name], MD (Oncology) Follow up visit for bladder cancer and anemia. Follow up in 4 weeks.  ?05/10/21-[name], MD (Gastroenterology) Follow up for iron deficiency anemia. Follow up in 3 months. ?05/09/21-Harmeet Candiss Norse (Nephrology) Follow up visit. ?04/18/21-[name], MD (Oncology) Follow up visit for bladder cancer and anemia. Proceed with Keytruda 200 mg today. Follow up in 4 weeks. ?03/21/21-[name], MD (Oncology) Seen for follow up visit for bladder cancer and anemia. Proceed with Keytruda 200 mg today. Follow up in 4 weeks. ?02/21/21-[name], MD (Oncology) Follow up visit on bladder cancer and anemia. Proceed with Keytruda 200 mg. Follow up in 4 weeks. ? ?Hospital visits:  ?None in previous 6 months ? ?Medications: ?Outpatient Encounter Medications as of 08/18/2021  ?Medication Sig  ? amLODipine (NORVASC) 5 MG tablet Take 1 tablet (5 mg total) by mouth daily.  ? diclofenac sodium (VOLTAREN) 1 % GEL Apply 2 g topically 4 (four) times daily. (Patient taking differently: Apply 2 g topically as needed.)  ? docusate sodium (COLACE) 100 MG capsule Take 1 capsule (100 mg total) by mouth daily.  ? lidocaine-prilocaine (EMLA) cream Apply 1 application topically as needed.  ? losartan  (COZAAR) 25 MG tablet Take 1 tablet (25 mg total) by mouth daily.  ? pembrolizumab (KEYTRUDA) 100 MG/4ML SOLN Inject 200 mg into the vein. Every 42 days,    Last txy 4/05, Next 5/03  ? Semaglutide (RYBELSUS) 3 MG TABS Take 1 tablet by mouth daily.  ? senna-docusate (SENOKOT-S) 8.6-50 MG tablet Take 1 tablet by mouth daily.  ? [DISCONTINUED] prochlorperazine (COMPAZINE) 10 MG tablet Take 1 tablet (10 mg total) by mouth every 6 (six) hours as needed (Nausea or vomiting).  ? ?Facility-Administered Encounter Medications as of 08/18/2021  ?Medication  ? heparin lock flush 100 UNIT/ML injection  ? ?Current antihyperglycemic regimen:  ?Rybelsus 3 mg take 1 tab daily ? ?Unsuccessful attempt to complete assessment call. I have called 3x and left 3 voicemail's for patient to return phone call when available. ? ?Adherence Review: ?Is the patient currently on a STATIN medication? No ?Is the patient currently on ACE/ARB medication? Yes ?Does the patient have >5 day gap between last estimated fill dates? No ? ? ?Care Gaps: ?Zoster Vaccines- Shingrix:Never done ?DEXA SCAN:Never done ?FOOT EXAM:Last completed: Mar 11, 2020 ? ?Star Rating Drugs: ?Losartan 25 mg Last filled:08/06/21 90 DS ?Rybelsus 3 mg Last filled:08/06/21 90 DS ? ?Corrie Mckusick, RMA ?Health Concierge ? ?

## 2021-08-21 ENCOUNTER — Ambulatory Visit
Admission: EM | Admit: 2021-08-21 | Discharge: 2021-08-21 | Disposition: A | Discharge status: Home / Self Care | Payer: Medicare Other

## 2021-08-21 ENCOUNTER — Ambulatory Visit: Payer: Self-pay

## 2021-08-21 ENCOUNTER — Ambulatory Visit (INDEPENDENT_AMBULATORY_CARE_PROVIDER_SITE_OTHER): Payer: Medicare Other

## 2021-08-21 ENCOUNTER — Other Ambulatory Visit: Payer: Self-pay

## 2021-08-21 DIAGNOSIS — R059 Cough, unspecified: Secondary | ICD-10-CM | POA: Diagnosis not present

## 2021-08-21 DIAGNOSIS — R0602 Shortness of breath: Secondary | ICD-10-CM | POA: Diagnosis not present

## 2021-08-21 DIAGNOSIS — J40 Bronchitis, not specified as acute or chronic: Secondary | ICD-10-CM

## 2021-08-21 DIAGNOSIS — J069 Acute upper respiratory infection, unspecified: Secondary | ICD-10-CM | POA: Diagnosis not present

## 2021-08-21 MED ORDER — ALBUTEROL SULFATE HFA 108 (90 BASE) MCG/ACT IN AERS: 2.0000 | INHALATION_SPRAY | RESPIRATORY_TRACT | 0 refills | Status: DC | PRN

## 2021-08-21 MED ORDER — BENZONATATE 100 MG PO CAPS: 200.0000 mg | ORAL_CAPSULE | Freq: Three times a day (TID) | ORAL | 0 refills | Status: DC

## 2021-08-21 MED ORDER — PROMETHAZINE-DM 6.25-15 MG/5ML PO SYRP: 5.0000 mL | ORAL_SOLUTION | Freq: Four times a day (QID) | ORAL | 0 refills | Status: DC | PRN

## 2021-08-21 MED ORDER — AEROCHAMBER MV MISC: 2 refills | Status: DC

## 2021-08-21 MED ORDER — DOXYCYCLINE HYCLATE 100 MG PO CAPS: 100.0000 mg | ORAL_CAPSULE | Freq: Two times a day (BID) | ORAL | 0 refills | Status: DC

## 2021-08-21 NOTE — ED Provider Notes (Signed)
?[place] [facility] ? ? ? ?CSN: [id number] ?Arrival date & time: 08/21/21  1507 ? ? ?  ? ?History   ?Chief Complaint ?Chief Complaint  ?Patient presents with  ? Cough  ? Shortness of Breath  ? ? ?HPI ?Angela Russell is a 71 y.o. female.  ? ?HPI ? ?71 year old female here for evaluation respiratory complaints. ? ?Patient reports that she has been experiencing cough and shortness of breath on exertion for the past 2 days.  She denies any shortness of breath when she is at rest but she does endorse wheezing.  She states that she is starting to bring up yellow sputum when she coughs.  She denies any fever, ear pain, or sore throat.  She also denies chest pain.  When asked about runny nose nasal congestion she states that it had not been running until she had to wear the mask when she came in here.  She has been on another patient with a cough recently but denies any other respiratory symptoms ? ?Past Medical History:  ?Diagnosis Date  ? Carotid artery plaque, right 01/2014  ? CKD (chronic kidney disease)   ? stage 2-3  ? Controlled diabetes mellitus type 2 with complications ([place])   ? Diabetes mellitus without complication ([place])   ? DM (diabetes mellitus), type 2, uncontrolled   ? Hyperlipidemia   ? Hypertension   ? Hypochromic microcytic anemia   ? mild  ? Metastatic urothelial carcinoma ([place]) 03/23/2019  ? Osteoporosis   ? Personal history of colonic polyps   ? Renal insufficiency   ? Rotator cuff tendonitis, right   ? ? ?Patient Active Problem List  ? Diagnosis Date Noted  ? Personal history of colonic polyps 05/16/2021  ? Controlled diabetes mellitus type 2 with complications ([place]) 29/47/6546  ? Malignant hypertension 08/30/2020  ? Parastomal hernia without obstruction or gangrene 07/12/2020  ? Rib pain on left side 08/05/2019  ? Chronic anticoagulation 08/05/2019  ? Anemia in stage 4 chronic kidney disease ([place]) 08/05/2019  ? Encounter for antineoplastic immunotherapy 08/05/2019  ? History of DVT of lower  extremity 06/26/2019  ? Personal history of other venous thrombosis and embolism 06/26/2019  ? Acute deep vein thrombosis (DVT) of femoral vein of right lower extremity ([place]) 03/31/2019  ? DVT (deep venous thrombosis) ([place]) 03/27/2019  ? Metastatic urothelial carcinoma ([place]) 03/23/2019  ? Vaginal pain 01/27/2019  ? Neuropathic pain 01/27/2019  ? Hyperkalemia 12/19/2018  ? Fluid collection at surgical site 10/17/2018  ? Other complications of procedures, not elsewhere classified, initial encounter 10/17/2018  ? Tachycardia 10/15/2018  ? Pyelonephritis 10/14/2018  ? Iron deficiency anemia due to chronic blood loss 08/26/2018  ? AKI (acute kidney injury) ([place]) 08/06/2018  ? Acute kidney failure, unspecified ([place]) 08/06/2018  ? Bladder carcinoma ([place]) 08/03/2018  ? Goals of care, counseling/discussion 08/03/2018  ? Bladder tumor 07/16/2018  ? Hyperlipidemia   ? Hypertension associated with diabetes ([place])   ? Renal insufficiency   ? CKD (chronic kidney disease)   ? Hypochromic microcytic anemia   ? Chronic anemia 07/24/2016  ? Carotid artery plaque, right 01/02/2014  ? Atherosclerosis of other arteries 11/05/2013  ? Cervical facet joint syndrome 03/20/2013  ? Spondylolisthesis of lumbar region 03/20/2013  ? ? ?Past Surgical History:  ?Procedure Laterality Date  ? COLONOSCOPY WITH PROPOFOL N/A 03/17/2021  ? Procedure: COLONOSCOPY WITH PROPOFOL;  Surgeon: Jonathon Bellows, MD;  Location[facility] ENDOSCOPY;  Service: Gastroenterology;  Laterality: N/A;  ? CYSTOSCOPY W/ RETROGRADES Bilateral 07/16/2018  ?  Procedure: CYSTOSCOPY WITH RETROGRADE PYELOGRAM;  Surgeon: Billey Co, MD;  Location: [facility] ORS;  Service: Urology;  Laterality: Bilateral;  ? ESOPHAGOGASTRODUODENOSCOPY  03/17/2021  ? Procedure: ESOPHAGOGASTRODUODENOSCOPY (EGD);  Surgeon: Jonathon Bellows, MD;  Location: [facility] ENDOSCOPY;  Service: Gastroenterology;;  ? GIVENS CAPSULE STUDY N/A 06/19/2021  ? Procedure: GIVENS CAPSULE STUDY;  Surgeon: Jonathon Bellows, MD;  Location: [facility]  ENDOSCOPY;  Service: Gastroenterology;  Laterality: N/A;  ? IVC FILTER INSERTION N/A 03/30/2019  ? Procedure: IVC FILTER INSERTION;  Surgeon: Algernon Huxley, MD;  Location: [place] CV LAB;  Service: Cardiovascular;  Laterality: N/A;  ? IVC FILTER REMOVAL N/A 06/15/2019  ? Procedure: IVC FILTER REMOVAL;  Surgeon: Algernon Huxley, MD;  Location: [place] CV LAB;  Service: Cardiovascular;  Laterality: N/A;  ? KNEE SURGERY Left 03/17/2013  ? torn meniscus  ? PERIPHERAL VASCULAR THROMBECTOMY Right 03/30/2019  ? Procedure: PERIPHERAL VASCULAR THROMBECTOMY;  Surgeon: Algernon Huxley, MD;  Location: [place] CV LAB;  Service: Cardiovascular;  Laterality: Right;  ? PORTA CATH INSERTION N/A 08/06/2018  ? Procedure: PORTA CATH INSERTION;  Surgeon: Algernon Huxley, MD;  Location: [place] [facility] LAB;  Service: Cardiovascular;  Laterality: N/A;  ? TOTAL ABDOMINAL HYSTERECTOMY  2000  ? due to bleeding and fibroids, partial- still has ovaries  ? TRANSURETHRAL RESECTION OF BLADDER TUMOR N/A 07/16/2018  ? Procedure: TRANSURETHRAL RESECTION OF BLADDER TUMOR (TURBT);  Surgeon: Billey Co, MD;  Location: [facility] ORS;  Service: Urology;  Laterality: N/A;  ? ? ?OB History   ? ? Gravida  ?1  ? Para  ?1  ? Term  ?1  ? Preterm  ?   ? AB  ?   ? Living  ?1  ?  ? ? SAB  ?   ? IAB  ?   ? Ectopic  ?   ? Multiple  ?   ? Live Births  ?   ?   ?  ?  ? ? ? ?Home Medications   ? ?Prior to Admission medications   ?Medication Sig Start Date End Date Taking? Authorizing Provider  ?albuterol (VENTOLIN HFA) 108 (90 Base) MCG/ACT inhaler Inhale 2 puffs into the lungs every 4 (four) hours as needed. 08/21/21  Yes Margarette Canada, NP  ?amLODipine (NORVASC) 5 MG tablet Take 1 tablet (5 mg total) by mouth daily. 07/14/21  Yes [name], NP  ?benzonatate (TESSALON) 100 MG capsule Take 2 capsules (200 mg total) by mouth every 8 (eight) hours. 08/21/21  Yes Margarette Canada, NP  ?docusate sodium (COLACE) 100 MG capsule Take 1 capsule (100 mg total) by mouth  daily. 03/22/20  Yes [name], MD  ?doxycycline (VIBRAMYCIN) 100 MG capsule Take 1 capsule (100 mg total) by mouth 2 (two) times daily. 08/21/21  Yes Margarette Canada, NP  ?lidocaine-prilocaine (EMLA) cream Apply 1 application topically as needed. 07/12/20  Yes [name], MD  ?losartan (COZAAR) 25 MG tablet Take 1 tablet (25 mg total) by mouth daily. 07/14/21  Yes [name], NP  ?Omega-3 Fatty Acids (FISH OIL PO) Take by mouth.   Yes [provider]  ?pembrolizumab (KEYTRUDA) 100 MG/4ML SOLN Inject 200 mg into the vein. Every 42 days,    Last txy 4/05, Next 5/03   Yes [provider]  ?promethazine-dextromethorphan (PROMETHAZINE-DM) 6.25-15 MG/5ML syrup Take 5 mLs by mouth 4 (four) times daily as needed. 08/21/21  Yes Margarette Canada, NP  ?Semaglutide (RYBELSUS) 3 MG TABS Take 1 tablet by mouth daily. 07/14/21  Yes [name], NP  ?senna-docusate (SENOKOT-S) 8.6-50 MG tablet Take 1 tablet by mouth daily. 07/11/21  Yes [name], MD  ?Spacer/Aero-Holding Chambers (AEROCHAMBER MV) inhaler Use as instructed 08/21/21  Yes Margarette Canada, NP  ?prochlorperazine (COMPAZINE) 10 MG tablet Take 1 tablet (10 mg total) by mouth every 6 (six) hours as needed (Nausea or vomiting). 08/03/18 08/26/18  [name], MD  ? ? ?Family History ?Family History  ?Problem Relation Age of Onset  ? Heart disease Mother   ? Heart attack Mother   ? Arthritis Father   ? Diabetes Brother   ? ? ?Social History ?Social History  ? ?Tobacco Use  ? Smoking status: Former  ?  Types: Cigarettes  ?  Quit date: 06/05/1991  ?  Years since quitting: 30.2  ? Smokeless tobacco: Never  ?Vaping Use  ? Vaping Use: Never used  ?Substance Use Topics  ? Alcohol use: No  ? Drug use: No  ? ? ? ?Allergies   ?Patient has no known allergies. ? ? ?Review of Systems ?Review of Systems  ?Constitutional:  Negative for fever.  ?HENT:  Positive for rhinorrhea. Negative for congestion, ear pain and sore throat.   ?Respiratory:  Positive for cough, shortness of breath and  wheezing.   ?Hematological: Negative.   ?Psychiatric/Behavioral: Negative.    ? ? ?Physical Exam ?Triage Vital Signs ?ED Triage Vitals  ?Enc Vitals Group  ?   BP 08/21/21 1611 (!) 146/70  ?   Pulse Rate

## 2021-08-21 NOTE — ED Triage Notes (Signed)
Pt reports having a chest cold x 6 days.  Feels SOB with exertion x 2 days.  No SOB at rest.  Took cold/flu medicine last night.   ?

## 2021-08-21 NOTE — Discharge Instructions (Addendum)
Take the Doxycycline twice daily for 10 days with food for treatment of URI and bronchitis. ? ?Use the albuterol inhaler every 4-6 hours as needed for shortness of breath, wheezing, and cough. ? ?Use the Tessalon Perles every 8 hours for your cough.  Taken with a small sip of water.  They may give you some numbness to the base of your tongue or metallic taste in her mouth, this is normal.  They are designed to calm down the cough reflex. ? ?Use the Promethazine DM cough syrup at bedtime as will make you drowsy.  You may take 1 teaspoon (5 mL) every 6 hours. ? ?Return for reevaluation for new or worsening symptoms.  ?

## 2021-08-21 NOTE — Telephone Encounter (Signed)
? ? ?  Chief Complaint: Cough, shortness of breath with exertion ?Symptoms: Hoarse, wheezing ?Frequency: Last week ?Pertinent Negatives: Patient denies chest pain, fever ?Disposition: '[]'$ ED /'[x]'$ Urgent Care (no appt availability in office) / '[]'$ Appointment(In office/virtual)/ '[]'$  [facility]/ '[]'$ Home Care/ '[]'$ Refused Recommended Disposition /'[]'$ [facility]/ '[]'$  Follow-up with PCP ?Additional Notes:   ?Reason for Disposition ? [1] MILD difficulty breathing (e.g., minimal/no SOB at rest, SOB with walking, pulse <100) AND [2] still present when not coughing ? ?Answer Assessment - Initial Assessment Questions ?1. ONSET: "When did the cough begin?"  ?    Last week ?2. SEVERITY: "How bad is the cough today?"  ?    Moderate ?3. SPUTUM: "Describe the color of your sputum" (none, dry cough; clear, white, yellow, green) ?    Yellow ?4. HEMOPTYSIS: "Are you coughing up any blood?" If so ask: "How much?" (flecks, streaks, tablespoons, etc.) ?    No ?5. DIFFICULTY BREATHING: "Are you having difficulty breathing?" If Yes, ask: "How bad is it?" (e.g., mild, moderate, severe)  ?  - MILD: No SOB at rest, mild SOB with walking, speaks normally in sentences, can lie down, no retractions, pulse < 100.  ?  - MODERATE: SOB at rest, SOB with minimal exertion and prefers to sit, cannot lie down flat, speaks in phrases, mild retractions, audible wheezing, pulse 100-120.  ?  - SEVERE: Very SOB at rest, speaks in single words, struggling to breathe, sitting hunched forward, retractions, pulse > 120  ?    Mild ?6. FEVER: "Do you have a fever?" If Yes, ask: "What is your temperature, how was it measured, and when did it start?" ?    No ?7. CARDIAC HISTORY: "Do you have any history of heart disease?" (e.g., heart attack, congestive heart failure)  ?    No ?8. LUNG HISTORY: "Do you have any history of lung disease?"  (e.g., pulmonary embolus, asthma, emphysema) ?    DVT - 2 YEars ago ?9. PE RISK FACTORS: "Do you have a history of  blood clots?" (or: recent major surgery, recent prolonged travel, bedridden) ?    No ?10. OTHER SYMPTOMS: "Do you have any other symptoms?" (e.g., runny nose, wheezing, chest pain) ?      Wheezing ?11. PREGNANCY: "Is there any chance you are pregnant?" "When was your last menstrual period?" ?      No ?12. TRAVEL: "Have you traveled out of the country in the last month?" (e.g., travel history, exposures) ?      No ? ?Protocols used: Cough - Acute Productive-A-AH ? ?

## 2021-09-05 ENCOUNTER — Inpatient Hospital Stay: Payer: Medicare Other

## 2021-09-05 ENCOUNTER — Inpatient Hospital Stay: Payer: Medicare Other | Attending: Oncology

## 2021-09-05 ENCOUNTER — Inpatient Hospital Stay (HOSPITAL_BASED_OUTPATIENT_CLINIC_OR_DEPARTMENT_OTHER): Payer: Medicare Other | Admitting: Oncology

## 2021-09-05 ENCOUNTER — Encounter: Payer: Self-pay | Admitting: Oncology

## 2021-09-05 VITALS — BP 148/77 | HR 85 | Temp 98.7°F | Resp 20 | Wt 207.5 lb

## 2021-09-05 DIAGNOSIS — C679 Malignant neoplasm of bladder, unspecified: Secondary | ICD-10-CM | POA: Diagnosis not present

## 2021-09-05 DIAGNOSIS — I700 Atherosclerosis of aorta: Secondary | ICD-10-CM | POA: Diagnosis not present

## 2021-09-05 DIAGNOSIS — Z79899 Other long term (current) drug therapy: Secondary | ICD-10-CM | POA: Insufficient documentation

## 2021-09-05 DIAGNOSIS — C7910 Secondary malignant neoplasm of unspecified urinary organs: Secondary | ICD-10-CM

## 2021-09-05 DIAGNOSIS — Z20822 Contact with and (suspected) exposure to covid-19: Secondary | ICD-10-CM | POA: Diagnosis not present

## 2021-09-05 DIAGNOSIS — Z5112 Encounter for antineoplastic immunotherapy: Secondary | ICD-10-CM | POA: Insufficient documentation

## 2021-09-05 DIAGNOSIS — M7989 Other specified soft tissue disorders: Secondary | ICD-10-CM | POA: Insufficient documentation

## 2021-09-05 DIAGNOSIS — Z86718 Personal history of other venous thrombosis and embolism: Secondary | ICD-10-CM | POA: Diagnosis not present

## 2021-09-05 DIAGNOSIS — D631 Anemia in chronic kidney disease: Secondary | ICD-10-CM

## 2021-09-05 DIAGNOSIS — N1832 Chronic kidney disease, stage 3b: Secondary | ICD-10-CM | POA: Insufficient documentation

## 2021-09-05 DIAGNOSIS — I129 Hypertensive chronic kidney disease with stage 1 through stage 4 chronic kidney disease, or unspecified chronic kidney disease: Secondary | ICD-10-CM | POA: Insufficient documentation

## 2021-09-05 DIAGNOSIS — K435 Parastomal hernia without obstruction or  gangrene: Secondary | ICD-10-CM | POA: Insufficient documentation

## 2021-09-05 DIAGNOSIS — J439 Emphysema, unspecified: Secondary | ICD-10-CM | POA: Diagnosis not present

## 2021-09-05 DIAGNOSIS — E1122 Type 2 diabetes mellitus with diabetic chronic kidney disease: Secondary | ICD-10-CM | POA: Diagnosis not present

## 2021-09-05 DIAGNOSIS — J432 Centrilobular emphysema: Secondary | ICD-10-CM | POA: Insufficient documentation

## 2021-09-05 DIAGNOSIS — E785 Hyperlipidemia, unspecified: Secondary | ICD-10-CM | POA: Diagnosis not present

## 2021-09-05 LAB — CBC WITH DIFFERENTIAL/PLATELET
Abs Immature Granulocytes: 0.02 10*3/uL (ref 0.00–0.07)
Basophils Absolute: 0 10*3/uL (ref 0.0–0.1)
Basophils Relative: 1 %
Eosinophils Absolute: 0.2 10*3/uL (ref 0.0–0.5)
Eosinophils Relative: 3 %
HCT: 33.2 % — ABNORMAL LOW (ref 36.0–46.0)
Hemoglobin: 10.2 g/dL — ABNORMAL LOW (ref 12.0–15.0)
Immature Granulocytes: 0 %
Lymphocytes Relative: 27 %
Lymphs Abs: 1.3 10*3/uL (ref 0.7–4.0)
MCH: 24.2 pg — ABNORMAL LOW (ref 26.0–34.0)
MCHC: 30.7 g/dL (ref 30.0–36.0)
MCV: 78.9 fL — ABNORMAL LOW (ref 80.0–100.0)
Monocytes Absolute: 0.3 10*3/uL (ref 0.1–1.0)
Monocytes Relative: 7 %
Neutro Abs: 2.9 10*3/uL (ref 1.7–7.7)
Neutrophils Relative %: 62 %
Platelets: 245 10*3/uL (ref 150–400)
RBC: 4.21 MIL/uL (ref 3.87–5.11)
RDW: 14.5 % (ref 11.5–15.5)
WBC: 4.8 10*3/uL (ref 4.0–10.5)
nRBC: 0 % (ref 0.0–0.2)

## 2021-09-05 LAB — COMPREHENSIVE METABOLIC PANEL
ALT: 12 U/L (ref 0–44)
AST: 22 U/L (ref 15–41)
Albumin: 3.8 g/dL (ref 3.5–5.0)
Alkaline Phosphatase: 86 U/L (ref 38–126)
Anion gap: 9 (ref 5–15)
BUN: 21 mg/dL (ref 8–23)
CO2: 22 mmol/L (ref 22–32)
Calcium: 8.8 mg/dL — ABNORMAL LOW (ref 8.9–10.3)
Chloride: 107 mmol/L (ref 98–111)
Creatinine, Ser: 1.43 mg/dL — ABNORMAL HIGH (ref 0.44–1.00)
GFR, Estimated: 39 mL/min — ABNORMAL LOW (ref >60)
Glucose, Bld: 112 mg/dL — ABNORMAL HIGH (ref 70–99)
Potassium: 3.9 mmol/L (ref 3.5–5.1)
Sodium: 138 mmol/L (ref 135–145)
Total Bilirubin: 0.5 mg/dL (ref 0.3–1.2)
Total Protein: 7.5 g/dL (ref 6.5–8.1)

## 2021-09-05 MED ORDER — SODIUM CHLORIDE 0.9% FLUSH
10.0000 mL | Freq: Once | INTRAVENOUS | Status: AC
  Administered 2021-09-05: 10 mL via INTRAVENOUS
  Filled 2021-09-05: qty 10

## 2021-09-05 MED ORDER — SODIUM CHLORIDE 0.9 % IV SOLN
200.0000 mg | Freq: Once | INTRAVENOUS | Status: AC
  Administered 2021-09-05: 200 mg via INTRAVENOUS
  Filled 2021-09-05: qty 200

## 2021-09-05 MED ORDER — HEPARIN SOD (PORK) LOCK FLUSH 100 UNIT/ML IV SOLN
500.0000 [IU] | Freq: Once | INTRAVENOUS | Status: AC | PRN
  Filled 2021-09-05: qty 5

## 2021-09-05 MED ORDER — HEPARIN SOD (PORK) LOCK FLUSH 100 UNIT/ML IV SOLN
INTRAVENOUS | Status: AC
  Administered 2021-09-05: 500 [IU]
  Filled 2021-09-05: qty 5

## 2021-09-05 MED ORDER — SODIUM CHLORIDE 0.9 % IV SOLN
Freq: Once | INTRAVENOUS | Status: AC
  Filled 2021-09-05: qty 250

## 2021-09-05 NOTE — Patient Instructions (Signed)
[facility]  Discharge Instructions: °Thank you for choosing [facility] to provide your oncology and hematology care.  °If you have a lab appointment with the Cancer Center, please go directly to the Cancer Center and check in at the registration area. ° °Wear comfortable clothing and clothing appropriate for easy access to any Portacath or PICC line.  ° °We strive to give you quality time with your provider. You may need to reschedule your appointment if you arrive late (15 or more minutes).  Arriving late affects you and other patients whose appointments are after yours.  Also, if you miss three or more appointments without notifying the office, you may be dismissed from the clinic at the provider’s discretion.    °  °For prescription refill requests, have your pharmacy contact our office and allow 72 hours for refills to be completed.   ° °Today you received the following chemotherapy and/or immunotherapy agents Keytruda °    °  °To help prevent nausea and vomiting after your treatment, we encourage you to take your nausea medication as directed. ° °BELOW ARE SYMPTOMS THAT SHOULD BE REPORTED IMMEDIATELY: °*FEVER GREATER THAN 100.4 F (38 °C) OR HIGHER °*CHILLS OR SWEATING °*NAUSEA AND VOMITING THAT IS NOT CONTROLLED WITH YOUR NAUSEA MEDICATION °*UNUSUAL SHORTNESS OF BREATH °*UNUSUAL BRUISING OR BLEEDING °*URINARY PROBLEMS (pain or burning when urinating, or frequent urination) °*BOWEL PROBLEMS (unusual diarrhea, constipation, pain near the anus) °TENDERNESS IN MOUTH AND THROAT WITH OR WITHOUT PRESENCE OF ULCERS (sore throat, sores in mouth, or a toothache) °UNUSUAL RASH, SWELLING OR PAIN  °UNUSUAL VAGINAL DISCHARGE OR ITCHING  ° °Items with * indicate a potential emergency and should be followed up as soon as possible or go to the Emergency Department if any problems should occur. ° °Please show the CHEMOTHERAPY ALERT CARD or IMMUNOTHERAPY ALERT CARD at check-in to  the Emergency Department and triage nurse. ° °Should you have questions after your visit or need to cancel or reschedule your appointment, please contact [facility]  [phone] and follow the prompts.  Office hours are 8:00 a.m. to 4:30 p.m. Monday - Friday. Please note that voicemails left after 4:00 p.m. may not be returned until the following business day.  We are closed weekends and major holidays. You have access to a nurse at all times for urgent questions. Please call the main number to the clinic [phone] and follow the prompts. ° °For any non-urgent questions, you may also contact your provider using MyChart. We now offer e-Visits for anyone 18 and older to request care online for non-urgent symptoms. For details visit [email or web address]. °  °Also download the MyChart app! Go to the app store, search "MyChart", open the app, select [facility], and log in with your MyChart username and password. ° °Due to Covid, a mask is required upon entering the hospital/clinic. If you do not have a mask, one will be given to you upon arrival. For doctor visits, patients may have 1 support person aged 18 or older with them. For treatment visits, patients cannot have anyone with them due to current Covid guidelines and our immunocompromised population.  °

## 2021-09-05 NOTE — Progress Notes (Signed)
Patient states she is getting more short of breathe.  ?

## 2021-09-05 NOTE — Progress Notes (Signed)
?Hematology/Oncology follow up note ?[phone] [phone] ? ? ?Patient Care Team: ?[name], NP as PCP - General ?Idelle Leech, OD (Optometry) ?[name], MD as Consulting Physician (Hematology and Oncology) ?[name], [place] (Inactive) (Pharmacist) ? ? ?CHIEF COMPLAINTS/REASON FOR VISIT:  ?Follow up for bladder cancer, anemia.  ? ?HISTORY OF PRESENTING ILLNESS:  ?Angela Russell is a  71 y.o.  female with PMH listed below who was referred to me for evaluation of newly diagnosed bladder cancer. ?Patient initially presented to emergency room at the end of January 2020 for evaluation of dysuria, hematuria and the left lower quadrant inguinal pain and flank pain.  1/28 2020 CT renal stone study showed suspected irregular wall thickening about the superior bladder, not well assessed due to degree of bladder distention.  Recommend cystoscopy for further evaluation.  No renal stone or obstructive uropathy.  Patient was given IV Rocephin and referred patient for outpatient urology follow-up.  Urine culture was negative.  She again presented to ER after 2 days with similar symptoms.  ?Patient has 25-pack-year smoking history, quit approximately 20 years ago.  No family history of any urology malignancies ?07/03/2018 another CT abdomen pelvis with contrast was done which showed no nephrolithiasis or hydronephrosis is identified.  Bladder is decompressed limiting evaluation. ? 07/16/2018.urology Dr. [name] - cystoscopy and bilateral retrograde pyelogram on 07/16/2018.  Pyelogram did not show any filling defect or abnormalities.  No hydronephrosis.  Ureteral orifice was not involved with tumor.  There is a large 5 cm posterior wall bladder tumor, bullous and sessile appearing.  Patient underwent TURBT.  ? ?Pathology: High-grade urothelial carcinoma, invasive into muscularis propria.  Lymphovascular invasion is present.  Carcinoma in situ is also identified.  Focal squamous differentiation is  noted, areas of invasive carcinoma display pleomorphic/sarcomatoid changes.  T2b ? ?08/07/2018 CT without contrast negative.  2 subpleural right upper lobe nodule 2 to 3 mm likely benign. ?She also had baseline audiometry done.  ?08/04/2018 ddMVAC x 1 cycle, stopped due to intolerance and AKI. ? ?Patient received 1 cycle of dd MVAC, not able to tolerate due to AKI. ?Patient then was referred to [facility] urology  ?09/22/2018 patient underwent a cystectomy, pathology pT3a N0 Mx.  11 lymph nodes were harvested and was all negative. ?Invasive urothelia carcinoma, high grade, with sarcomatoid features.  ? ?10/14/2018 patient was admitted due to pyelonephritis and a pelvic fluid collection.  Drain was placed. ?10/23/2018 drain was removed. ?Patient has had difficulties getting to her appointments to [facility]. ?02/09/2019, patient presented with abdominal pain. ?CT concerning for small bowel obstruction and increased size of right pelvic fluid collection concerning for cancer recurrence.  Right hydronephrosis to the level of pelvis and enlarged lymph nodes. ?Patient had JP drain placed with CT guidance to pelvic fluid collection and drained 400 cc amber fluid.  Culture was negative for growth of microorganisms and cytology was negative for malignancy.-JP drain was removed on the day of discharge. ?CT-guided core biopsy of pelvic lymph node adenopathy was attempted but not successful. ? ?# Right lower extremity DVT, provoked by Transvenous biopsy  ?02/26/2019 transvenous biopsy by IR unsuccessful transvenous biopsy of the right pelvis mass. ?Post biopsy acute thrombus in the right external iliac and common femoral vein.  Patient was recommended to start anticoagulation with Xarelto however due to the co-pay, patient is not able to afford the medication. ?Anticoagulation regimen was switched to Eliquis 5 mg.  Patient reports that she has been taking it once a  day. ?03/20/2019 PET showed FDG avid tissue in the cystectomy bed,  retroperitoneal and pelvic lymphadenopathy.  Right common iliac DVT. ? ?# increased right lower extremity swelling.  She was started on Eliquis for anticoagulation by [facility].  We clarified with her pharmacy and she was actually taking Eliquis 2.5 mg twice daily. ?Right lower extremity swelling has not improved but instead worsened. ?03/16/2019 She had ultrasound right lower extremity done which showed persistent extensive proximal right lower extremity DVT.  Anticoagulation regimen has increased to Eliquis 5 mg twice daily. ? ?# establish care with [facility] oncology Dr. [name] for evaluation.  Dr. [name] recommended starting immunotherapy with PD-L1 inhibitor Pembrolizumab 200 mg every 3 weeks.  The sarcomatoid histology may not respond to chemotherapy well, could portend a better chance of response into immunotherapy ?PET scan after 4 cycles of Keytruda showed single mildly enlarged central mesenteric lymph node in the upper pelvis with SUV 9.6.  No other abnormal hypermetabolic activity was reported. ? ?# #Right lower extremity  femoral vein DVT provoked by transvenous biopsy in the context of cancer recurrence, Status post IV C filter and mechanical thrombectomy.  IVC filter has been removed. ?She is currently off anticoagulation after she developed hematuria. ? ?# 03/30/2019 Status post IVC filter placement, mechanical thrombectomy.-IVC filter was retrieved in January 2021. ?# PD-L1 CPS 100%.  ?# 03/31/2019 started on immunotherapy Keytruda. ? ?# 04/26/2020 CT chest abdomen pelvis was reviewed and discussed with patient. ?No definitive finding of disease recurrence or metastasis.  Small amount of ascites similar to prior.There is a parastomal hernia containing a small margin of adjacent small bowel.  This is associated with wall thickening in approximately 7 cm segment of bowel adjacent to the hernia.  There is adjacent abnormal stranding of the mesentery and omentum. ? ?# 08/19/2020, CT chest abdomen pelvis showed  no evidence of new/progressive metastatic disease. NED ?Hold off treatment due to uncontrolled hypotension. ? ?# Chronic abdominal discomfort due to hernia. ?08/11/2020, patient was seen by Dr. Dolphus Jenny for parastomal hernia.  Patient has no obstructive symptoms and would like to continue to monitor her symptoms and hold off any intervention at this point.  Patient was recommended to wear ostomy hernia belt and was referred to [facility] wound ostomy nursing team. ? ?04/10/2021 CT chest abdomen pelvis without contrast showed no evidence of disease recurrence.  Small volume fluid in the pelvis, decreased.  Emphysema/diffuse bilateral bronchial wall thickening.  CAD. ? ?She received IV venofer previously and did not tolerate due to energized joint pain afterwards.  ?#Hernia -parastomal hernia.   ?She has establish care with [facility] hernia center.  She was recommended to monitor her symptoms and aware ostomy belt ? ?07/31/2021, CT chest abdomen pelvis without contrast showed NED. ? ?INTERVAL HISTORY ?Angela Russell is a 71 y.o. female who has above history reviewed by me today presents for follow up visit for evaluation form metastatic high-grade urothelial carcinoma of the bladder. Sarcomatoid feature.  ?Patient has been on maintenance immunotherapy.   ?She is currently on Keytruda every 4 weeks per patient's request. ?Patient recently had upper respiratory infection and was treated with a course of doxycycline for 10 days by urgent care.  Cough is improved.  She has mild shortness of breath.  No other new complaint ?08/21/2021 she had a chest x-ray done which showed no active cardiopulmonary disease. ? ?Review of Systems  ?Constitutional:  Negative for appetite change, chills, fatigue and fever.  ?HENT:   Negative for hearing loss and voice  change.   ?Eyes:  Negative for eye problems.  ?Respiratory:  Negative for chest tightness and cough.   ?Cardiovascular:  Negative for chest pain and leg swelling.  ?Gastrointestinal:  Negative  for abdominal distention, abdominal pain, blood in stool and constipation.  ?Endocrine: Negative for hot flashes.  ?Genitourinary:  Negative for difficulty urinating and frequency.   ?Musculoskeletal:  Posit

## 2021-09-07 DIAGNOSIS — Z20822 Contact with and (suspected) exposure to covid-19: Secondary | ICD-10-CM | POA: Diagnosis not present

## 2021-10-02 ENCOUNTER — Emergency Department: Payer: Medicare Other

## 2021-10-02 ENCOUNTER — Inpatient Hospital Stay
Admission: EM | Admit: 2021-10-02 | Discharge: 2021-10-05 | DRG: 872 | Disposition: A | Discharge status: Home / Self Care | Payer: Medicare Other | Attending: Internal Medicine | Admitting: Internal Medicine

## 2021-10-02 ENCOUNTER — Other Ambulatory Visit: Payer: Self-pay

## 2021-10-02 DIAGNOSIS — E669 Obesity, unspecified: Secondary | ICD-10-CM | POA: Diagnosis present

## 2021-10-02 DIAGNOSIS — J439 Emphysema, unspecified: Secondary | ICD-10-CM | POA: Diagnosis not present

## 2021-10-02 DIAGNOSIS — M79662 Pain in left lower leg: Secondary | ICD-10-CM | POA: Diagnosis present

## 2021-10-02 DIAGNOSIS — Z86010 Personal history of colonic polyps: Secondary | ICD-10-CM

## 2021-10-02 DIAGNOSIS — K5900 Constipation, unspecified: Secondary | ICD-10-CM | POA: Diagnosis present

## 2021-10-02 DIAGNOSIS — Z86718 Personal history of other venous thrombosis and embolism: Secondary | ICD-10-CM

## 2021-10-02 DIAGNOSIS — R079 Chest pain, unspecified: Secondary | ICD-10-CM | POA: Diagnosis not present

## 2021-10-02 DIAGNOSIS — R0789 Other chest pain: Secondary | ICD-10-CM | POA: Diagnosis not present

## 2021-10-02 DIAGNOSIS — N390 Urinary tract infection, site not specified: Secondary | ICD-10-CM | POA: Diagnosis present

## 2021-10-02 DIAGNOSIS — M810 Age-related osteoporosis without current pathological fracture: Secondary | ICD-10-CM | POA: Diagnosis present

## 2021-10-02 DIAGNOSIS — Z7901 Long term (current) use of anticoagulants: Secondary | ICD-10-CM

## 2021-10-02 DIAGNOSIS — R110 Nausea: Secondary | ICD-10-CM | POA: Diagnosis not present

## 2021-10-02 DIAGNOSIS — Z95828 Presence of other vascular implants and grafts: Secondary | ICD-10-CM | POA: Diagnosis not present

## 2021-10-02 DIAGNOSIS — Z7984 Long term (current) use of oral hypoglycemic drugs: Secondary | ICD-10-CM

## 2021-10-02 DIAGNOSIS — Z8249 Family history of ischemic heart disease and other diseases of the circulatory system: Secondary | ICD-10-CM

## 2021-10-02 DIAGNOSIS — D631 Anemia in chronic kidney disease: Secondary | ICD-10-CM | POA: Diagnosis present

## 2021-10-02 DIAGNOSIS — E782 Mixed hyperlipidemia: Secondary | ICD-10-CM | POA: Diagnosis not present

## 2021-10-02 DIAGNOSIS — E1122 Type 2 diabetes mellitus with diabetic chronic kidney disease: Secondary | ICD-10-CM | POA: Diagnosis present

## 2021-10-02 DIAGNOSIS — C679 Malignant neoplasm of bladder, unspecified: Secondary | ICD-10-CM | POA: Diagnosis present

## 2021-10-02 DIAGNOSIS — Z6837 Body mass index (BMI) 37.0-37.9, adult: Secondary | ICD-10-CM | POA: Diagnosis not present

## 2021-10-02 DIAGNOSIS — E118 Type 2 diabetes mellitus with unspecified complications: Secondary | ICD-10-CM | POA: Diagnosis not present

## 2021-10-02 DIAGNOSIS — N184 Chronic kidney disease, stage 4 (severe): Secondary | ICD-10-CM | POA: Diagnosis present

## 2021-10-02 DIAGNOSIS — I10 Essential (primary) hypertension: Secondary | ICD-10-CM | POA: Diagnosis not present

## 2021-10-02 DIAGNOSIS — Z20822 Contact with and (suspected) exposure to covid-19: Secondary | ICD-10-CM | POA: Diagnosis present

## 2021-10-02 DIAGNOSIS — A419 Sepsis, unspecified organism: Secondary | ICD-10-CM | POA: Diagnosis not present

## 2021-10-02 DIAGNOSIS — Z90710 Acquired absence of both cervix and uterus: Secondary | ICD-10-CM | POA: Diagnosis not present

## 2021-10-02 DIAGNOSIS — Z87891 Personal history of nicotine dependence: Secondary | ICD-10-CM

## 2021-10-02 DIAGNOSIS — Z833 Family history of diabetes mellitus: Secondary | ICD-10-CM

## 2021-10-02 DIAGNOSIS — I6521 Occlusion and stenosis of right carotid artery: Secondary | ICD-10-CM | POA: Diagnosis present

## 2021-10-02 DIAGNOSIS — R0689 Other abnormalities of breathing: Secondary | ICD-10-CM | POA: Diagnosis not present

## 2021-10-02 DIAGNOSIS — Z8261 Family history of arthritis: Secondary | ICD-10-CM | POA: Diagnosis not present

## 2021-10-02 DIAGNOSIS — Z86711 Personal history of pulmonary embolism: Secondary | ICD-10-CM | POA: Diagnosis not present

## 2021-10-02 DIAGNOSIS — I129 Hypertensive chronic kidney disease with stage 1 through stage 4 chronic kidney disease, or unspecified chronic kidney disease: Secondary | ICD-10-CM | POA: Diagnosis present

## 2021-10-02 DIAGNOSIS — E785 Hyperlipidemia, unspecified: Secondary | ICD-10-CM | POA: Diagnosis present

## 2021-10-02 DIAGNOSIS — Z9221 Personal history of antineoplastic chemotherapy: Secondary | ICD-10-CM

## 2021-10-02 DIAGNOSIS — M79605 Pain in left leg: Secondary | ICD-10-CM | POA: Diagnosis not present

## 2021-10-02 DIAGNOSIS — A4151 Sepsis due to Escherichia coli [E. coli]: Secondary | ICD-10-CM | POA: Diagnosis not present

## 2021-10-02 DIAGNOSIS — Z79899 Other long term (current) drug therapy: Secondary | ICD-10-CM

## 2021-10-02 LAB — BLOOD CULTURE ID PANEL (REFLEXED) - BCID2

## 2021-10-02 LAB — COMPREHENSIVE METABOLIC PANEL
ALT: 12 U/L (ref 0–44)
AST: 23 U/L (ref 15–41)
Albumin: 3.9 g/dL (ref 3.5–5.0)
Alkaline Phosphatase: 89 U/L (ref 38–126)
Anion gap: 7 (ref 5–15)
BUN: 32 mg/dL — ABNORMAL HIGH (ref 8–23)
CO2: 23 mmol/L (ref 22–32)
Calcium: 9 mg/dL (ref 8.9–10.3)
Chloride: 110 mmol/L (ref 98–111)
Creatinine, Ser: 1.66 mg/dL — ABNORMAL HIGH (ref 0.44–1.00)
GFR, Estimated: 33 mL/min — ABNORMAL LOW (ref >60)
Glucose, Bld: 121 mg/dL — ABNORMAL HIGH (ref 70–99)
Potassium: 3.8 mmol/L (ref 3.5–5.1)
Sodium: 140 mmol/L (ref 135–145)
Total Bilirubin: 0.7 mg/dL (ref 0.3–1.2)
Total Protein: 7.6 g/dL (ref 6.5–8.1)

## 2021-10-02 LAB — PROTIME-INR
INR: 1.2 (ref 0.8–1.2)
Prothrombin Time: 14.9 seconds (ref 11.4–15.2)

## 2021-10-02 LAB — CBC WITH DIFFERENTIAL/PLATELET
Abs Immature Granulocytes: 0.05 10*3/uL (ref 0.00–0.07)
Basophils Absolute: 0 10*3/uL (ref 0.0–0.1)
Basophils Relative: 0 %
Eosinophils Absolute: 0 10*3/uL (ref 0.0–0.5)
Eosinophils Relative: 0 %
HCT: 33.9 % — ABNORMAL LOW (ref 36.0–46.0)
Hemoglobin: 10.3 g/dL — ABNORMAL LOW (ref 12.0–15.0)
Immature Granulocytes: 1 %
Lymphocytes Relative: 5 %
Lymphs Abs: 0.5 10*3/uL — ABNORMAL LOW (ref 0.7–4.0)
MCH: 24.1 pg — ABNORMAL LOW (ref 26.0–34.0)
MCHC: 30.4 g/dL (ref 30.0–36.0)
MCV: 79.4 fL — ABNORMAL LOW (ref 80.0–100.0)
Monocytes Absolute: 0.3 10*3/uL (ref 0.1–1.0)
Monocytes Relative: 3 %
Neutro Abs: 9.4 10*3/uL — ABNORMAL HIGH (ref 1.7–7.7)
Neutrophils Relative %: 91 %
Platelets: 225 10*3/uL (ref 150–400)
RBC: 4.27 MIL/uL (ref 3.87–5.11)
RDW: 14.5 % (ref 11.5–15.5)
WBC: 10.3 10*3/uL (ref 4.0–10.5)
nRBC: 0 % (ref 0.0–0.2)

## 2021-10-02 LAB — URINALYSIS, COMPLETE (UACMP) WITH MICROSCOPIC
Bacteria, UA: NONE SEEN
Bilirubin Urine: NEGATIVE
Glucose, UA: NEGATIVE mg/dL
Ketones, ur: NEGATIVE mg/dL
Nitrite: POSITIVE — AB
Protein, ur: 30 mg/dL — AB
Specific Gravity, Urine: 1.014 (ref 1.005–1.030)
Squamous Epithelial / HPF: NONE SEEN (ref 0–5)
WBC, UA: >50 WBC/hpf — ABNORMAL HIGH (ref 0–5)
pH: 5 (ref 5.0–8.0)

## 2021-10-02 LAB — APTT: aPTT: 48 seconds — ABNORMAL HIGH (ref 24–36)

## 2021-10-02 LAB — MRSA NEXT GEN BY PCR, NASAL: MRSA by PCR Next Gen: NOT DETECTED

## 2021-10-02 LAB — RESP PANEL BY RT-PCR (FLU A&B, COVID) ARPGX2
Influenza A by PCR: NEGATIVE
Influenza B by PCR: NEGATIVE
SARS Coronavirus 2 by RT PCR: NEGATIVE

## 2021-10-02 LAB — GLUCOSE, CAPILLARY: Glucose-Capillary: 145 mg/dL — ABNORMAL HIGH (ref 70–99)

## 2021-10-02 LAB — LACTIC ACID, PLASMA
Lactic Acid, Venous: 2 mmol/L (ref 0.5–1.9)
Lactic Acid, Venous: 1.3 mmol/L (ref 0.5–1.9)

## 2021-10-02 LAB — CBG MONITORING, ED: Glucose-Capillary: 101 mg/dL — ABNORMAL HIGH (ref 70–99)

## 2021-10-02 MED ORDER — SODIUM CHLORIDE 0.9 % IV SOLN: INTRAVENOUS | Status: AC

## 2021-10-02 MED ORDER — AMLODIPINE BESYLATE 5 MG PO TABS
5.0000 mg | ORAL_TABLET | Freq: Every day | ORAL | Status: DC
  Administered 2021-10-03: 5 mg via ORAL
  Filled 2021-10-02: qty 1

## 2021-10-02 MED ORDER — INSULIN ASPART 100 UNIT/ML IJ SOLN: 0.0000 [IU] | Freq: Every day | INTRAMUSCULAR | Status: DC

## 2021-10-02 MED ORDER — ACETAMINOPHEN 650 MG RE SUPP: 650.0000 mg | Freq: Four times a day (QID) | RECTAL | Status: DC | PRN

## 2021-10-02 MED ORDER — LOSARTAN POTASSIUM 25 MG PO TABS
25.0000 mg | ORAL_TABLET | Freq: Every day | ORAL | Status: DC
  Administered 2021-10-03 – 2021-10-05 (×3): 25 mg via ORAL
  Filled 2021-10-02 (×3): qty 1

## 2021-10-02 MED ORDER — SENNOSIDES-DOCUSATE SODIUM 8.6-50 MG PO TABS
1.0000 | ORAL_TABLET | Freq: Every day | ORAL | Status: DC
  Administered 2021-10-03: 1 via ORAL
  Filled 2021-10-02: qty 1

## 2021-10-02 MED ORDER — SODIUM CHLORIDE 0.9 % IV SOLN
2.0000 g | Freq: Once | INTRAVENOUS | Status: AC
  Administered 2021-10-02: 2 g via INTRAVENOUS
  Filled 2021-10-02: qty 12.5

## 2021-10-02 MED ORDER — ALBUTEROL SULFATE (2.5 MG/3ML) 0.083% IN NEBU: 3.0000 mL | INHALATION_SOLUTION | RESPIRATORY_TRACT | Status: DC | PRN

## 2021-10-02 MED ORDER — IOHEXOL 350 MG/ML SOLN
50.0000 mL | Freq: Once | INTRAVENOUS | Status: AC | PRN
  Administered 2021-10-02: 50 mL via INTRAVENOUS
  Filled 2021-10-02: qty 50

## 2021-10-02 MED ORDER — VANCOMYCIN HCL IN DEXTROSE 1-5 GM/200ML-% IV SOLN: 1000.0000 mg | Freq: Once | INTRAVENOUS | Status: DC

## 2021-10-02 MED ORDER — METRONIDAZOLE 500 MG/100ML IV SOLN
500.0000 mg | Freq: Once | INTRAVENOUS | Status: AC
  Administered 2021-10-02: 500 mg via INTRAVENOUS
  Filled 2021-10-02: qty 100

## 2021-10-02 MED ORDER — ACETAMINOPHEN 500 MG PO TABS
1000.0000 mg | ORAL_TABLET | Freq: Once | ORAL | Status: AC
  Administered 2021-10-02: 1000 mg via ORAL
  Filled 2021-10-02: qty 2

## 2021-10-02 MED ORDER — SODIUM CHLORIDE 0.9 % IV BOLUS (SEPSIS)
1000.0000 mL | Freq: Once | INTRAVENOUS | Status: AC
  Administered 2021-10-02: 1000 mL via INTRAVENOUS

## 2021-10-02 MED ORDER — ONDANSETRON HCL 4 MG/2ML IJ SOLN
4.0000 mg | Freq: Four times a day (QID) | INTRAMUSCULAR | Status: DC | PRN
Start: 2021-10-02 — End: 2021-10-05
  Administered 2021-10-02: 4 mg via INTRAVENOUS
  Filled 2021-10-02: qty 2

## 2021-10-02 MED ORDER — INSULIN ASPART 100 UNIT/ML IJ SOLN: 0.0000 [IU] | Freq: Three times a day (TID) | INTRAMUSCULAR | Status: DC

## 2021-10-02 MED ORDER — ONDANSETRON HCL 4 MG PO TABS: 4.0000 mg | ORAL_TABLET | Freq: Four times a day (QID) | ORAL | Status: DC | PRN

## 2021-10-02 MED ORDER — DOCUSATE SODIUM 100 MG PO CAPS: 100.0000 mg | ORAL_CAPSULE | Freq: Every day | ORAL | Status: DC | PRN

## 2021-10-02 MED ORDER — SENNOSIDES-DOCUSATE SODIUM 8.6-50 MG PO TABS: 1.0000 | ORAL_TABLET | Freq: Every evening | ORAL | Status: DC | PRN

## 2021-10-02 MED ORDER — ACETAMINOPHEN 325 MG PO TABS
650.0000 mg | ORAL_TABLET | Freq: Four times a day (QID) | ORAL | Status: DC | PRN
  Administered 2021-10-03: 650 mg via ORAL
  Filled 2021-10-02: qty 2

## 2021-10-02 MED ORDER — SODIUM CHLORIDE 0.9 % IV SOLN
1.0000 g | INTRAVENOUS | Status: DC
  Administered 2021-10-02: 1 g via INTRAVENOUS
  Filled 2021-10-02: qty 10

## 2021-10-02 MED ORDER — HEPARIN SODIUM (PORCINE) 5000 UNIT/ML IJ SOLN
5000.0000 [IU] | Freq: Three times a day (TID) | INTRAMUSCULAR | Status: DC
  Administered 2021-10-02 – 2021-10-05 (×7): 5000 [IU] via SUBCUTANEOUS
  Filled 2021-10-02 (×7): qty 1

## 2021-10-02 MED ORDER — VANCOMYCIN HCL 2000 MG/400ML IV SOLN
2000.0000 mg | Freq: Once | INTRAVENOUS | Status: AC
  Administered 2021-10-02: 2000 mg via INTRAVENOUS
  Filled 2021-10-02: qty 400

## 2021-10-02 NOTE — ED Notes (Signed)
Transport Requested  ?

## 2021-10-02 NOTE — Progress Notes (Signed)
Elink following code sepsis °

## 2021-10-02 NOTE — ED Notes (Signed)
Patient transported to CT 

## 2021-10-02 NOTE — H&P (Signed)
?[facility] ?History and Physical ? ?Angela Russell ZDG:[id number] DOB: Jul 23, 1950 DOA: 10/02/2021 ? ?Referring physician: ED ? ?PCP: [name], NP  ? ?Patient is coming from: Home ? ?Chief Complaint: Leg pain, generalized weakness, chills ? ?HPI: Angela Russell is a 71 y.o. female Angela Russell is a 71 y.o. female with past medical history of CKD stage IV, diabetes mellitus, hypertension, hyperlipidemia, history of bladder cancer last chemotherapy 2 weeks back presented to hospital with left leg swelling and pain associated with generalized body ache, fatigue and weakness.  Patient does have history of DVT and PE and had IVC filter in the past which was recently removed and is currently in not on any anticoagulation.  Patient also complained of mild chest tightness but no shortness of breath dyspnea cough.  Patient does have history of urostomy from previous bladder cancer surgery.  In the past she was on chemotherapy with affected her kidneys so is currently on Keytruda.  Patient denies any dizziness, lightheadedness or syncope but has generalized fatigue and weakness.  Complains of chills but denies obvious fever at home.  Denies any nausea vomiting or abdominal pain.  States that she is largely constipated.  Denies any sick contacts or recent travel.  At baseline patient states that she is able to ambulate by herself. ? ?In the ED, patient was noted to be febrile with a temperature of 100 ?F, was mildly tachycardic with elevated lactate.  WBC was 10.3.  Urinalysis was abnormal.  Blood culture and urine culture were sent from the ED. ultrasound of the left lower extremity was negative for DVT.  CT angiogram of the chest was negative for PE or pneumonia.  Patient was given IV fluid bolus for sepsis and broad-spectrum antibiotic including vancomycin cefepime and metronidazole was given.  COVID and influenza was negative.  Patient was then considered for admission to the hospital for further  evaluation and treatment. ? ?Review of Systems:  ?All systems were reviewed and were negative unless otherwise mentioned in the HPI ? ?Past Medical History:  ?Diagnosis Date  ? Carotid artery plaque, right 01/2014  ? CKD (chronic kidney disease)   ? stage 2-3  ? Controlled diabetes mellitus type 2 with complications ([place])   ? Diabetes mellitus without complication ([place])   ? DM (diabetes mellitus), type 2, uncontrolled   ? Hyperlipidemia   ? Hypertension   ? Hypochromic microcytic anemia   ? mild  ? Metastatic urothelial carcinoma ([place]) 03/23/2019  ? Osteoporosis   ? Personal history of colonic polyps   ? Renal insufficiency   ? Rotator cuff tendonitis, right   ? ?Past Surgical History:  ?Procedure Laterality Date  ? COLONOSCOPY WITH PROPOFOL N/A 03/17/2021  ? Procedure: COLONOSCOPY WITH PROPOFOL;  Surgeon: Jonathon Bellows, MD;  Location: [facility] ENDOSCOPY;  Service: Gastroenterology;  Laterality: N/A;  ? CYSTOSCOPY W/ RETROGRADES Bilateral 07/16/2018  ? Procedure: CYSTOSCOPY WITH RETROGRADE PYELOGRAM;  Surgeon: Billey Co, MD;  Location: [facility] ORS;  Service: Urology;  Laterality: Bilateral;  ? ESOPHAGOGASTRODUODENOSCOPY  03/17/2021  ? Procedure: ESOPHAGOGASTRODUODENOSCOPY (EGD);  Surgeon: Jonathon Bellows, MD;  Location: [facility] ENDOSCOPY;  Service: Gastroenterology;;  ? GIVENS CAPSULE STUDY N/A 06/19/2021  ? Procedure: GIVENS CAPSULE STUDY;  Surgeon: Jonathon Bellows, MD;  Location: [facility] ENDOSCOPY;  Service: Gastroenterology;  Laterality: N/A;  ? IVC FILTER INSERTION N/A 03/30/2019  ? Procedure: IVC FILTER INSERTION;  Surgeon: Algernon Huxley, MD;  Location: [place] CV LAB;  Service: Cardiovascular;  Laterality: N/A;  ? IVC  FILTER REMOVAL N/A 06/15/2019  ? Procedure: IVC FILTER REMOVAL;  Surgeon: Algernon Huxley, MD;  Location: [facility];  Service: Cardiovascular;  Laterality: N/A;  ? KNEE SURGERY Left 03/17/2013  ? torn meniscus  ? PERIPHERAL VASCULAR THROMBECTOMY Right 03/30/2019  ? Procedure: PERIPHERAL VASCULAR  THROMBECTOMY;  Surgeon: Algernon Huxley, MD;  Location: [place] [facility];  Service: Cardiovascular;  Laterality: Right;  ? PORTA CATH INSERTION N/A 08/06/2018  ? Procedure: PORTA CATH INSERTION;  Surgeon: Algernon Huxley, MD;  Location: [place] [facility];  Service: Cardiovascular;  Laterality: N/A;  ? TOTAL ABDOMINAL HYSTERECTOMY  2000  ? due to bleeding and fibroids, partial- still has ovaries  ? TRANSURETHRAL RESECTION OF BLADDER TUMOR N/A 07/16/2018  ? Procedure: TRANSURETHRAL RESECTION OF BLADDER TUMOR (TURBT);  Surgeon: Billey Co, MD;  Location: [facility] ORS;  Service: Urology;  Laterality: N/A;  ? ? ?Social History:  reports that she quit smoking about 30 years ago. Her smoking use included cigarettes. She has never used smokeless tobacco. She reports that she does not drink alcohol and does not use drugs. ? ?No Known Allergies ? ?Family History  ?Problem Relation Age of Onset  ? Heart disease Mother   ? Heart attack Mother   ? Arthritis Father   ? Diabetes Brother   ?  ? ?Prior to Admission medications   ?Medication Sig Start Date End Date Taking? Authorizing Provider  ?albuterol (VENTOLIN HFA) 108 (90 Base) MCG/ACT inhaler Inhale 2 puffs into the lungs every 4 (four) hours as needed. 08/21/21   Margarette Canada, NP  ?amLODipine (NORVASC) 5 MG tablet Take 1 tablet (5 mg total) by mouth daily. 07/14/21   [name], NP  ?benzonatate (TESSALON) 100 MG capsule Take 2 capsules (200 mg total) by mouth every 8 (eight) hours. 08/21/21   Margarette Canada, NP  ?docusate sodium (COLACE) 100 MG capsule Take 1 capsule (100 mg total) by mouth daily. 03/22/20   [name], MD  ?doxycycline (VIBRAMYCIN) 100 MG capsule Take 1 capsule (100 mg total) by mouth 2 (two) times daily. 08/21/21   Margarette Canada, NP  ?lidocaine-prilocaine (EMLA) cream Apply 1 application topically as needed. 07/12/20   [name], MD  ?losartan (COZAAR) 25 MG tablet Take 1 tablet (25 mg total) by mouth daily. 07/14/21   [name], NP  ?Omega-3 Fatty  Acids (FISH OIL PO) Take by mouth.    [provider]  ?pembrolizumab (KEYTRUDA) 100 MG/4ML SOLN Inject 200 mg into the vein. Every 42 days,    Last txy 4/05, Next 5/03    [provider]  ?promethazine-dextromethorphan (PROMETHAZINE-DM) 6.25-15 MG/5ML syrup Take 5 mLs by mouth 4 (four) times daily as needed. 08/21/21   Margarette Canada, NP  ?Semaglutide (RYBELSUS) 3 MG TABS Take 1 tablet by mouth daily. 07/14/21   [name], NP  ?senna-docusate (SENOKOT-S) 8.6-50 MG tablet Take 1 tablet by mouth daily. ?Patient not taking: Reported on 09/05/2021 07/11/21   [name], MD  ?Spacer/Aero-Holding Chambers (AEROCHAMBER MV) inhaler Use as instructed 08/21/21   Margarette Canada, NP  ?prochlorperazine (COMPAZINE) 10 MG tablet Take 1 tablet (10 mg total) by mouth every 6 (six) hours as needed (Nausea or vomiting). 08/03/18 08/26/18  [name], MD  ? ? ?Physical Exam: ?Vitals:  ? 10/02/21 0951 10/02/21 1200 10/02/21 1230 10/02/21 1300  ?BP: (!) 185/63 (!) 150/65 (!) 150/65 (!) 128/42  ?Pulse: (!) 118 (!) 112    ?Resp: '17 17 15 20  '$ ?Temp: 100 ?F (  37.8 ?C)     ?TempSrc: Oral     ?SpO2: 100% 100%    ?Weight: 95.3 kg     ?Height: '5\' 3"'$  (1.6 m)     ? ?Wt Readings from Last 3 Encounters:  ?10/02/21 95.3 kg  ?09/05/21 94.1 kg  ?08/08/21 96.6 kg  ? ?Body mass index is 37.2 kg/m?. ? ?General: Obese built, not in obvious distress, appears fatigued, having some chills. ?HENT: Normocephalic, pupils equally reacting to light and accommodation.  No scleral pallor or icterus noted. Oral mucosa is moist.  ?Chest:  Clear breath sounds.  Diminished breath sounds bilaterally. No crackles or wheezes.  ?CVS: S1 &S2 heard. No murmur.  Regular rate and rhythm.  Mild tachycardia noted ?Abdomen: Soft, nontender, nondistended.  Bowel sounds are heard.  Liver is not palpable, no abdominal mass palpated.  Urostomy bag in place with ?Extremities: No cyanosis, clubbing or edema.  Peripheral pulses are palpable.  Mild tenderness on palpation of  bilateral lower extremities.  Psych: Alert, awake and oriented, normal mood ?CNS:  No cranial nerve deficits.  Power equal in all extremities.   No cerebellar signs.   ?Skin: Warm and dry.  No rashes noted. ? ?Labs

## 2021-10-02 NOTE — ED Provider Notes (Signed)
? ?[facility] ?Provider Note ? ? ? Event Date/Time  ? First MD Initiated Contact with Patient 10/02/21 [phone]   ?  (approximate) ? ?History  ? ?Chief Complaint: Leg Pain and Generalized Body Aches ? ?HPI ? ?Angela Russell is a 71 y.o. female with a past medical history of CKD, diabetes, hypertension, hyperlipidemia, bladder cancer currently on chemotherapy last dose 2 weeks ago presents to the emergency department for left leg pain generalized body aches chills and weakness.  According to the patient for the past 2 or 3 days she has been experiencing pain and some mild swelling in her left leg.  Patient states a history of DVT and prior IVC filter but the IVC filter has since been removed and the patient is no longer on anticoagulation.  Patient found to be febrile to 100.0 tachycardic to 118.  Patient states slight shortness of breath today as well and describes a mild chest tightness but no chest pain. ? ?Physical Exam  ? ?Triage Vital Signs: ?ED Triage Vitals [10/02/21 0951]  ?Enc Vitals Group  ?   BP (!) 185/63  ?   Pulse Rate (!) 118  ?   Resp 17  ?   Temp 100 ?F (37.8 ?C)  ?   Temp Source Oral  ?   SpO2 100 %  ?   Weight 210 lb (95.3 kg)  ?   Height '5\' 3"'$  (1.6 m)  ?   Head Circumference   ?   Peak Flow   ?   Pain Score 9  ?   Pain Loc   ?   Pain Edu?   ?   Excl. in [place]?   ? ? ?Most recent vital signs: ?Vitals:  ? 10/02/21 0951  ?BP: (!) 185/63  ?Pulse: (!) 118  ?Resp: 17  ?Temp: 100 ?F (37.8 ?C)  ?SpO2: 100%  ? ? ?General: Awake, no distress.  Does appear fatigued. ?CV:  Good peripheral perfusion.  Regular rhythm rate around 120 bpm. ?Resp:  Normal effort.  Equal breath sounds bilaterally.  ?Abd:  No distention.  Soft, nontender.  No rebound or guarding. ?Other:  Patient has mild tenderness palpation left lower extremity in the popliteal fossa area.  No significant edema noted. ? ? ?ED Results / Procedures / Treatments  ? ?RADIOLOGY ? ?I personally reviewed the CTA images no concerning  abnormality seen on my evaluation. ? ?Radiology is read the CT is negative for PE. ? ? ?MEDICATIONS ORDERED IN ED: ?Medications  ?sodium chloride 0.9 % bolus 1,000 mL (has no administration in time range)  ?ceFEPIme (MAXIPIME) 2 g in sodium chloride 0.9 % 100 mL IVPB (has no administration in time range)  ?metroNIDAZOLE (FLAGYL) IVPB 500 mg (has no administration in time range)  ?vancomycin (VANCOCIN) IVPB 1000 mg/200 mL premix (has no administration in time range)  ? ? ? ?IMPRESSION / MDM / ASSESSMENT AND PLAN / ED COURSE  ?I reviewed the triage vital signs and the nursing notes. ? ?Patient presents emergency department for chills weakness left leg pain mild shortness of breath.  Patient currently taking chemotherapy last dose 2 weeks ago.  Given the patient's tachycardia and fever we will check labs cultures start the patient on broad-spectrum IV antibiotics.  Given the patient's left leg pain and shortness of breath with prior DVTs and IVC filter now off anticoagulation with IVC filter removed we will obtain a left lower extremity ultrasound to rule out DVT as well as a CTA of  the chest to evaluate for PE.  We will check labs including a CBC with differential to evaluate for possible neutropenia we will continue to closely monitor while awaiting results.  Patient agreeable to plan of care. ? ?Patient CTA is negative for PE or pneumonia.  Ultrasound negative for DVT.  Patient's work-up shows a elevated lactate of 2.0, normal white blood cell count, negative COVID and flu, reassuring chemistry with chronic renal insufficiency.  Urinalysis however does appear to be nitrite positive with white blood cell clumps consistent with urinary tract infection.  Patient's overall picture consistent with sepsis due to urinary tract infection.  Patient receiving IV antibiotics, blood and urine cultures have been sent we will admit to the hospital service for ongoing treatment. ? ?CRITICAL CARE ?Performed by: Harvest Dark ? ? ?Total critical care time: 30 minutes ? ?Critical care time was exclusive of separately billable procedures and treating other patients. ? ?Critical care was necessary to treat or prevent imminent or life-threatening deterioration. ? ?Critical care was time spent personally by me on the following activities: development of treatment plan with patient and/or surrogate as well as nursing, discussions with consultants, evaluation of patient's response to treatment, examination of patient, obtaining history from patient or surrogate, ordering and performing treatments and interventions, ordering and review of laboratory studies, ordering and review of radiographic studies, pulse oximetry and re-evaluation of patient's condition. ? ? ?FINAL CLINICAL IMPRESSION(S) / ED DIAGNOSES  ? ?Sepsis ?Urinary tract infection ? ?Note:  This document was prepared using Dragon voice recognition software and may include unintentional dictation errors. ?  ?Harvest Dark, MD ?10/02/21 1352 ? ?

## 2021-10-02 NOTE — ED Triage Notes (Signed)
See first nurse note. 

## 2021-10-02 NOTE — Consult Note (Signed)
CODE SEPSIS - PHARMACY COMMUNICATION ? ?**Broad Spectrum Antibiotics should be administered within 1 hour of Sepsis diagnosis** ? ?Time Code Sepsis Called/Page Received: 9476 ? ?Antibiotics Ordered: cefepime, Vanco, flagyl  ? ?Time of 1st antibiotic administration: 5465 ? ? ? ? ? ?Dorothe Pea ,PharmD, BCPS ?Clinical Pharmacist  ?10/02/2021  10:14 AM  ?

## 2021-10-02 NOTE — Hospital Course (Addendum)
Angela Russell is a 71 y.o. female with past medical history of CKD stage IV, diabetes mellitus, hypertension, hyperlipidemia, history of bladder cancer last chemotherapy 2 weeks back presented to hospital with left leg swelling and pain associated with generalized body ache, fatigue and weakness.  Patient does have history of DVT and PE and had IVC filter in the past which was recently removed and is currently in not on any anticoagulation.  Patient also complained of mild chest tightness but no shortness of breath dyspnea cough.  Patient does have history of urostomy from previous bladder cancer surgery.  In the past she was on chemotherapy with affected her kidneys so is currently on Keytruda.  Patient denies any dizziness, lightheadedness or syncope but has generalized fatigue and weakness.  Complains of chills but denies obvious fever at home.  Denies any nausea vomiting or abdominal pain.  States that she is largely constipated.  Denies any sick contacts or recent travel.  At baseline patient states that she is able to ambulate by herself. ? ?In the ED, patient was noted to be febrile with a temperature of 100 ?F, was mildly tachycardic with elevated lactate.  WBC was 10.3.  Urinalysis was abnormal.  Blood culture and urine culture were sent from the ED. ultrasound of the left lower extremity was negative for DVT.  CT angiogram of the chest was negative for PE or pneumonia.  Patient was given IV fluid bolus for sepsis and broad-spectrum antibiotic including vancomycin cefepime and metronidazole was given.  COVID and influenza was negative.  Patient was then considered for admission to the hospital for further evaluation and treatment. ?

## 2021-10-02 NOTE — ED Triage Notes (Signed)
First Nurse Note:  ?Pt via EMS, reports L leg pain this AM. States she was cold and shivering. EMS states that she breathing 40 times a minute. Pt has a hx of DVT of the L leg and has filter.  ?Pt has a hx of bladder cancer, pt is actively getting treatment. Reports 1 episode of vomiting also. ? ?99.8 orally  ?138/100 ?106 HR ?142 CBG  ? ?

## 2021-10-02 NOTE — ED Notes (Signed)
Vanc & flagyl compatable from [facility] ? ?

## 2021-10-02 NOTE — Consult Note (Signed)
PHARMACY -  BRIEF ANTIBIOTIC NOTE  ? ?Pharmacy has received consult(s) for cefepime and Vancomycin from an ED provider.  The patient's profile has been reviewed for ht/wt/allergies/indication/available labs.   ? ?One time order(s) placed for  ?Cefepime 2 gram ?Vancomycin 2 gram ? ?Further antibiotics/pharmacy consults should be ordered by admitting physician if indicated.       ?                ?Thank you, ?Dorothe Pea, PharmD, BCPS ?Clinical Pharmacist   ?10/02/2021  10:14 AM  ?

## 2021-10-03 DIAGNOSIS — C679 Malignant neoplasm of bladder, unspecified: Secondary | ICD-10-CM | POA: Diagnosis not present

## 2021-10-03 DIAGNOSIS — A419 Sepsis, unspecified organism: Secondary | ICD-10-CM | POA: Diagnosis not present

## 2021-10-03 DIAGNOSIS — N390 Urinary tract infection, site not specified: Secondary | ICD-10-CM | POA: Diagnosis not present

## 2021-10-03 DIAGNOSIS — N184 Chronic kidney disease, stage 4 (severe): Secondary | ICD-10-CM | POA: Diagnosis not present

## 2021-10-03 LAB — GLUCOSE, CAPILLARY: Glucose-Capillary: 109 mg/dL — ABNORMAL HIGH (ref 70–99)

## 2021-10-03 LAB — CBC
HCT: 28.1 % — ABNORMAL LOW (ref 36.0–46.0)
Hemoglobin: 8.5 g/dL — ABNORMAL LOW (ref 12.0–15.0)
MCH: 24 pg — ABNORMAL LOW (ref 26.0–34.0)
MCHC: 30.2 g/dL (ref 30.0–36.0)
MCV: 79.4 fL — ABNORMAL LOW (ref 80.0–100.0)
Platelets: 192 10*3/uL (ref 150–400)
RBC: 3.54 MIL/uL — ABNORMAL LOW (ref 3.87–5.11)
RDW: 14.7 % (ref 11.5–15.5)
WBC: 15.2 10*3/uL — ABNORMAL HIGH (ref 4.0–10.5)
nRBC: 0 % (ref 0.0–0.2)

## 2021-10-03 LAB — BASIC METABOLIC PANEL
Anion gap: 6 (ref 5–15)
BUN: 27 mg/dL — ABNORMAL HIGH (ref 8–23)
CO2: 21 mmol/L — ABNORMAL LOW (ref 22–32)
Calcium: 8.1 mg/dL — ABNORMAL LOW (ref 8.9–10.3)
Chloride: 117 mmol/L — ABNORMAL HIGH (ref 98–111)
Creatinine, Ser: 1.7 mg/dL — ABNORMAL HIGH (ref 0.44–1.00)
GFR, Estimated: 32 mL/min — ABNORMAL LOW (ref >60)
Glucose, Bld: 100 mg/dL — ABNORMAL HIGH (ref 70–99)
Potassium: 3.5 mmol/L (ref 3.5–5.1)
Sodium: 144 mmol/L (ref 135–145)

## 2021-10-03 LAB — MAGNESIUM: Magnesium: 1.9 mg/dL (ref 1.7–2.4)

## 2021-10-03 MED ORDER — SENNA 8.6 MG PO TABS
1.0000 | ORAL_TABLET | Freq: Every day | ORAL | Status: DC
  Administered 2021-10-03 – 2021-10-05 (×3): 8.6 mg via ORAL
  Filled 2021-10-03 (×2): qty 1

## 2021-10-03 MED ORDER — POLYETHYLENE GLYCOL 3350 17 G PO PACK
17.0000 g | PACK | Freq: Two times a day (BID) | ORAL | Status: DC
  Administered 2021-10-03 – 2021-10-05 (×5): 17 g via ORAL
  Filled 2021-10-03 (×5): qty 1

## 2021-10-03 MED ORDER — CHLORHEXIDINE GLUCONATE CLOTH 2 % EX PADS
6.0000 | MEDICATED_PAD | Freq: Every day | CUTANEOUS | Status: DC
  Administered 2021-10-03 – 2021-10-05 (×3): 6 via TOPICAL

## 2021-10-03 MED ORDER — SODIUM CHLORIDE 0.9 % IV SOLN
2.0000 g | INTRAVENOUS | Status: DC
  Administered 2021-10-03 – 2021-10-04 (×2): 2 g via INTRAVENOUS
  Filled 2021-10-03: qty 20
  Filled 2021-10-03: qty 2

## 2021-10-03 MED ORDER — SODIUM CHLORIDE 0.9 % IV SOLN
1.0000 g | Freq: Once | INTRAVENOUS | Status: AC
  Administered 2021-10-03: 1 g via INTRAVENOUS
  Filled 2021-10-03: qty 10

## 2021-10-03 MED ORDER — SEMAGLUTIDE 3 MG PO TABS: 1.0000 | ORAL_TABLET | Freq: Every day | ORAL | Status: DC

## 2021-10-03 MED ORDER — SORBITOL 70 % SOLN
960.0000 mL | TOPICAL_OIL | Freq: Once | ORAL | Status: DC | PRN
  Filled 2021-10-03: qty 473

## 2021-10-03 MED ORDER — AMLODIPINE BESYLATE 10 MG PO TABS
10.0000 mg | ORAL_TABLET | Freq: Every day | ORAL | Status: DC
  Administered 2021-10-04 – 2021-10-05 (×2): 10 mg via ORAL
  Filled 2021-10-03 (×2): qty 1

## 2021-10-03 NOTE — Progress Notes (Signed)
Mobility Specialist - Progress Note ? ? ? 10/03/21 1600  ?Mobility  ?Activity Ambulated independently in hallway  ?Level of Assistance Independent  ?Distance Ambulated (ft) 350 ft  ?Activity Response Tolerated well  ?$Mobility charge 1 Mobility  ? ? ? ?Pt supine upon arrival using RA. Pt completes all activities indep and voices no complaints. Returns to bed with needs in reach. ? ?Angela Russell ?Mobility Specialist ?10/03/21, 4:29 PM ? ? ? ?

## 2021-10-03 NOTE — Progress Notes (Signed)
PHARMACY - PHYSICIAN COMMUNICATION ?CRITICAL VALUE ALERT - BLOOD CULTURE IDENTIFICATION (BCID) ? ? BCID results: 1 (anaerobic) of 4 bottles with E. Coli, no resistance.  Pt already on Ceftriaxone 1 gm q24h.   ? ?Name of provider contacted: Morton Amy, NP ? ?Changes to prescribed antibiotics required: Increase to Ceftriaxone 2 gm q24h per recommendations ? ?Renda Rolls, PharmD, MBA ?10/03/2021 ?12:08 AM ? ?

## 2021-10-03 NOTE — Care Management (Signed)
?  Transition of Care (TOC) Screening Note ? ? ?Patient Details  ?Name: Angela Russell ?Date of Birth: 1951-03-14 ? ? ?Transition of Care (TOC) CM/SW Contact:    ?Pete Pelt, RN ?Phone Number: ?10/03/2021, 1:49 PM ? ? ? ?Transition of Care Department [facility]) has reviewed patient and no TOC needs have been identified at this time. We will continue to monitor patient advancement through interdisciplinary progression rounds. If new patient transition needs arise, please place a TOC consult. ?  ? ? ?

## 2021-10-03 NOTE — Progress Notes (Addendum)
?PROGRESS NOTE ? ?Angela Russell    DOB: September 04, 1950, 71 y.o.  ?IWP:[id number]  ?  Code Status: Full Code   ?DOA: 10/02/2021   LOS: 1  ? ?Brief hospital course  ?Angela Russell is a 71 y.o. female with a PMH significant for CKD stage IV, diabetes mellitus, hypertension, hyperlipidemia, history of bladder cancer last chemotherapy 2 weeks back. Patient does have history of DVT and PE and had IVC filter in the past which was recently removed and is currently in not on any anticoagulation.history of urostomy from previous bladder cancer surgery.  In the past she was on chemotherapy with affected her kidneys so is currently on Keytruda ? ?They presented from home to the ED on 10/02/2021 with left leg pain, fatigue x a couple days. Denies N/V/abdominal pain. Endorses constipation. ? ?In the ED, it was found that they were tachycardic up to 110s, afebrile, hypertensive to 185/63, and stable on room air. She did not meet sepsis criteria.  ?Significant findings included urinalysis positive for nitrites, large leukocytes, >50 WBCs, negative CTA for PE, no evidence of LE DVT on doppler. Labs were otherwise relatively unremarkable and at baseline. Urine cultures pending.  ? ?They were initially treated with IV Abx of vancomycin, cefepime, and flagyl.  ?Patient was admitted to medicine service for further workup and management of UTI as outlined in detail below. ? ?10/03/21 -stable ? ?Assessment & Plan  ?Principal Problem: ?  Sepsis secondary to UTI [facility]) ?Active Problems: ?  Hyperlipidemia ?  Hypertension ?  Bladder carcinoma ([place]) ?  History of DVT of lower extremity ?  Anemia in stage 4 chronic kidney disease ([place]) ?  Controlled diabetes mellitus type 2 with complications ([place]) ? ?Bacteremia  UTI- patient at high risk due to relative immunocompromised state and bladder carcinoma. Blood culture positive for e coli.  ?-  continue IV fluids ?- f/u urine culture ?- f/u blood culture sensitivities  ?- continue IV  ceftriaxone ? ?Acute on chronic kidney disease stage IV- patient had worsening Cr from baseline of ~1.4 to 1.7 today. Infection as well as medication side effect from initial antibiotics possibility. ?- continue IV fluids ?- metabolic panel am ? ?Left calf pain- suspect MSK. No signs of infection, swelling, injury. ?- voltaren gel PRN ? ?Bladder carcinoma- s/p urostomy.  ?- continue outpatient management per oncology, Keytruda ? ?H/o DVT- negative LE doppler and CTA on admission ?- continue eliquis ? ?Anemia- at baseline ?- CBC am ? ?HTN- hypertensive since admitted ?- continue home medications. Increased amlodipine ? ?Non-insulin dependent DM type II- hgb A1c recently was 6.8 which is well below goal for age og 8.0 ?- discontinue sliding scale ?- continue home medication of semaglutide ? ?Body mass index is 37.3 kg/m?. ? ?VTE ppx: heparin injection 5,000 Units Start: 10/02/21 2200 ? ? ?Diet:  ?   ?Diet  ? Diet Carb Modified Fluid consistency: Thin; Room service appropriate? Yes  ? ?Consultants: ?None  ?Subjective 10/03/21   ? ?Pt reports doing better overall. She denies dysuria today. She states that her leg still has some cramping like pain in the calf but that it is improved since admission. ?  ?Objective  ? ?Vitals:  ? 10/02/21 2000 10/02/21 2101 10/03/21 0500 10/03/21 0540  ?BP:  129/63  (!) 142/62  ?Pulse:  96  89  ?Resp:  18  18  ?Temp: 99 ?F (37.2 ?C) 98.5 ?F (36.9 ?C)  98.3 ?F (36.8 ?C)  ?TempSrc: Oral     ?SpO2:  98%  98%  ?Weight:  95.5 kg 95.5 kg   ?Height:  '5\' 3"'$  (1.6 m)    ? ? ?Intake/Output Summary (Last 24 hours) at 10/03/2021 0729 ?Last data filed at 10/03/2021 5732 ?Gross per 24 hour  ?Intake 1657.09 ml  ?Output 1600 ml  ?Net 57.09 ml  ? ?Filed Weights  ? 10/02/21 0951 10/02/21 2101 10/03/21 0500  ?Weight: 95.3 kg 95.5 kg 95.5 kg  ?  ? ?Physical Exam:  ?General: awake, alert, NAD ?HEENT: atraumatic, clear conjunctiva, anicteric sclera, MMM, hearing grossly normal ?Respiratory: normal respiratory  effort. ?Cardiovascular: quick capillary refill, normal S1/S2, RRR, no JVD, murmurs ?Gastrointestinal: soft, NT, ND ?Nervous: A&O x3. no gross focal neurologic deficits, normal speech ?Extremities: moves all equally, no edema, normal tone. Mild tenderness to palpation of left calf. No deformity or skin changes observed. ROM and strength intact ?Skin: dry, intact, normal temperature, normal color. No rashes, lesions or ulcers on exposed skin ?Psychiatry: anxious mood, congruent affect ? ?Labs   ?I have personally reviewed the following labs and imaging studies ?CBC ?   ?Component Value Date/Time  ? WBC 15.2 (H) 10/03/2021 2025  ? RBC 3.54 (L) 10/03/2021 4270  ? HGB 8.5 (L) 10/03/2021 6237  ? HGB 10.8 (L) 07/14/2021 0818  ? HCT 28.1 (L) 10/03/2021 6283  ? HCT 34.7 07/14/2021 0818  ? PLT 192 10/03/2021 0625  ? PLT 257 07/14/2021 0818  ? MCV 79.4 (L) 10/03/2021 1517  ? MCV 77 (L) 07/14/2021 0818  ? MCH 24.0 (L) 10/03/2021 6160  ? MCHC 30.2 10/03/2021 0625  ? RDW 14.7 10/03/2021 0625  ? RDW 13.6 07/14/2021 0818  ? LYMPHSABS 0.5 (L) 10/02/2021 1017  ? LYMPHSABS 1.2 07/14/2021 0818  ? MONOABS 0.3 10/02/2021 1017  ? EOSABS 0.0 10/02/2021 1017  ? EOSABS 0.2 07/14/2021 0818  ? BASOSABS 0.0 10/02/2021 1017  ? BASOSABS 0.0 07/14/2021 0818  ? ? ?  Latest Ref Rng & Units 10/03/2021  ?  6:25 AM 10/02/2021  ? 10:17 AM 09/05/2021  ?  8:47 AM  ?BMP  ?Glucose 70 - 99 mg/dL 100   121   112    ?BUN 8 - 23 mg/dL 27   32   21    ?Creatinine 0.44 - 1.00 mg/dL 1.70   1.66   1.43    ?Sodium 135 - 145 mmol/L 144   140   138    ?Potassium 3.5 - 5.1 mmol/L 3.5   3.8   3.9    ?Chloride 98 - 111 mmol/L 117   110   107    ?CO2 22 - 32 mmol/L '21   23   22    '$ ?Calcium 8.9 - 10.3 mg/dL 8.1   9.0   8.8    ? ? ?CT Angio Chest PE W and/or Wo Contrast ? ?Result Date: 10/02/2021 ?CLINICAL DATA:  Pulmonary embolism (PE) suspected, high prob EXAM: CT ANGIOGRAPHY CHEST WITH CONTRAST TECHNIQUE: Multidetector CT imaging of the chest was performed using the standard  protocol during bolus administration of intravenous contrast. Multiplanar CT image reconstructions and MIPs were obtained to evaluate the vascular anatomy. RADIATION DOSE REDUCTION: This exam was performed according to the departmental dose-optimization program which includes automated exposure control, adjustment of the mA and/or kV according to patient size and/or use of iterative reconstruction technique. CONTRAST:  4m OMNIPAQUE IOHEXOL 350 MG/ML SOLN COMPARISON:  CT chest/abdomen/pelvis July 31, 2021. FINDINGS: Cardiovascular: No evidence of pulmonary embolism to the proximal segmental level. More distal evaluation  is limited by respiratory motion. Normal heart size. No pericardial effusion. Atherosclerosis. Aberrant right subclavian artery passes behind the esophagus. Mediastinum/Nodes: Stable chronic anterior mediastinal mass which was non hypermetabolic on prior PET CT. Similar mediastinal and hilar lymph nodes which are not pathologically enlarged by CT size criteria. Thyroid gland, trachea, and esophagus demonstrate no significant findings. Lungs/Pleura: Unchanged 3 mm in the right upper lobe, stable over multiple priors. No consolidation. Similar areas of bronchial wall thickening. No pleural effusions or pneumothorax. Upper Abdomen: No acute abnormality. Musculoskeletal: No chest wall abnormality. No acute or significant osseous findings. Review of the MIP images confirms the above findings. IMPRESSION: 1. No evidence of pulmonary embolism to the proximal segmental level. More distal evaluation is limited by respiratory motion. 2. Similar emphysema (ICD10-J43.9) and diffuse bronchial wall thickening. Electronically Signed   By: Margaretha Sheffield M.D.   On: 10/02/2021 12:25  ? ?US Venous Img Lower Unilateral Left ? ?Result Date: 10/02/2021 ?CLINICAL DATA:  pain/hx of DVT EXAM: LEFT LOWER EXTREMITY VENOUS DOPPLER ULTRASOUND TECHNIQUE: Gray-scale sonography with compression, as well as color and duplex  ultrasound, were performed to evaluate the deep venous system(s) from the level of the common femoral vein through the popliteal and proximal calf veins. COMPARISON:  None. FINDINGS: VENOUS Normal compressibility of the common f

## 2021-10-03 NOTE — Progress Notes (Deleted)
Mobility Specialist - Progress Note ? ? ? 10/03/21 1600  ?Mobility  ?Activity Ambulated independently in hallway  ?Level of Assistance Independent  ?Distance Ambulated (ft) 350 ft  ?Activity Response Tolerated well  ?$Mobility charge 1 Mobility  ? ?Pt supine upon arrival using RA. Completes all activities indep with RW --- impulsive to stand without awareness of IV. Pt voices L LE pain but tolerates well. Pt left in chair with needs in reach and daughter at bedside. ? ?Merrily Brittle ?Mobility Specialist ?10/03/21, 4:28 PM ? ? ? ? ?

## 2021-10-03 NOTE — Evaluation (Signed)
Physical Therapy Evaluation ?Patient Details ?Name: Angela Russell ?MRN: [id number] ?DOB: 1950-07-30 ?Today's Date: 10/03/2021 ? ?History of Present Illness ? presented to ER secondary to LE pain, chills, weakness; admitted for management of sepsis secondary to UTI.  ?Clinical Impression ? Patient sitting indep edge of bed upon arrival to room; alert and oriented, follows commands and agreeable to participation with session.  Endorses mild soreness to R knee (baseline); unchanged with this admission.  Bilat UE/LE strength and ROM grossly symmetrical and WFL; no focal weakness appreciated.  Able to complete bed mobility indep; sit/stand, basic transfers and gait (400') without assist device, mod indep.  Demonstrates good gait mechanics, good cadence and gait speed (10' walk time, 5 seconds); easily completes dynamic gait components without buckling, LOB or safety concern. ?Appears  to be at baseline level of functional ability; no acute PT needs identified at this time.  Will complete PT order at this time.  Do encourage continued mobilization throughout remaining hospital stay (3x/week) to maintain functional strength/endurance. ?   ? ?Recommendations for follow up therapy are one component of a multi-disciplinary discharge planning process, led by the attending physician.  Recommendations may be updated based on patient status, additional functional criteria and insurance authorization. ? ?Follow Up Recommendations No PT follow up ? ?  ?Assistance Recommended at Discharge None  ?Patient can return home with the following ?   ? ?  ?Equipment Recommendations    ?Recommendations for Other Services ?    ?  ?Functional Status Assessment Patient has not had a recent decline in their functional status  ? ?  ?Precautions / Restrictions Precautions ?Precautions: None ?Precaution Comments: R chest port ?Restrictions ?Weight Bearing Restrictions: No  ? ?  ? ?Mobility ? Bed Mobility ?Overal bed mobility: Modified Independent ?   ?  ?  ?  ?  ?  ?  ?  ? ?Transfers ?Overall transfer level: Independent ?  ?  ?  ?  ?  ?  ?  ?  ?General transfer comment: good LE strength and control ?  ? ?Ambulation/Gait ?Ambulation/Gait assistance: Modified independent (Device/Increase time) ?Gait Distance (Feet): 400 Feet ?Assistive device: None ?  ?Gait velocity: 10' walk time, 5 seconds ?  ?  ?General Gait Details: reciprocal stepping with good step height/length, good cadence; completes dynamic gait components without difficulty, safety concern or LOB ? ?Stairs ?  ?  ?  ?  ?  ? ?Wheelchair Mobility ?  ? ?Modified Rankin (Stroke Patients Only) ?  ? ?  ? ?Balance Overall balance assessment: Modified Independent ?  ?  ?  ?  ?  ?  ?  ?  ?  ?  ?  ?  ?  ?  ?  ?  ?  ?  ?   ? ? ? ?Pertinent Vitals/Pain Pain Assessment ?Pain Assessment: Faces ?Faces Pain Scale: Hurts a little bit ?Pain Location: R knee ?Pain Descriptors / Indicators: Aching ?Pain Intervention(s): Limited activity within patient's tolerance, Monitored during session, Repositioned  ? ? ?Home Living Family/patient expects to be discharged to:: Private residence ?Living Arrangements: Alone ?Available Help at Discharge: Family ?Type of Home: Mobile home ?Home Access: Stairs to enter ?  ?Entrance Stairs-Number of Steps: 3 front (bilat rails, too wide); 6 back ?  ?Home Layout: One level ?Home Equipment: None ?   ?  ?Prior Function Prior Level of Function : Independent/Modified Independent;Driving;Working/employed ?  ?  ?  ?  ?  ?  ?Mobility Comments: Indep with ADLs,  household and community mobilization without assist device; denies fall history.  Employed as regional truck driver ?  ?  ? ? ?Hand Dominance  ?   ? ?  ?Extremity/Trunk Assessment  ? Upper Extremity Assessment ?Upper Extremity Assessment: Overall WFL for tasks assessed ?  ? ?Lower Extremity Assessment ?Lower Extremity Assessment: Overall WFL for tasks assessed (grossly 4+/5 throughout LEs) ?  ? ?   ?Communication  ? Communication: No  difficulties  ?Cognition Arousal/Alertness: Awake/alert ?Behavior During Therapy: [facility] for tasks assessed/performed ?Overall Cognitive Status: Within Functional Limits for tasks assessed ?  ?  ?  ?  ?  ?  ?  ?  ?  ?  ?  ?  ?  ?  ?  ?  ?  ?  ?  ? ?  ?General Comments   ? ?  ?Exercises    ? ?Assessment/Plan  ?  ?PT Assessment Patient does not need any further PT services  ?PT Problem List   ? ?   ?  ?PT Treatment Interventions     ? ?PT Goals (Current goals can be found in the Care Plan section)  ?Acute Rehab PT Goals ?Patient Stated Goal: to return home ?PT Goal Formulation: All assessment and education complete, DC therapy ?Time For Goal Achievement: 10/03/21 ?Potential to Achieve Goals: Good ? ?  ?Frequency   ?  ? ? ?Co-evaluation   ?  ?  ?  ?  ? ? ?  ?AM-PAC PT "6 Clicks" Mobility  ?Outcome Measure Help needed turning from your back to your side while in a flat bed without using bedrails?: None ?Help needed moving from lying on your back to sitting on the side of a flat bed without using bedrails?: None ?Help needed moving to and from a bed to a chair (including a wheelchair)?: None ?Help needed standing up from a chair using your arms (e.g., wheelchair or bedside chair)?: None ?Help needed to walk in hospital room?: None ?Help needed climbing 3-5 steps with a railing? : None ?6 Click Score: 24 ? ?  ?End of Session Equipment Utilized During Treatment: Gait belt ?Activity Tolerance: Patient tolerated treatment well ?Patient left: in bed;with call bell/phone within reach (seated edge of bed) ?Nurse Communication: Mobility status ?PT Visit Diagnosis: Muscle weakness (generalized) (M62.81);Difficulty in walking, not elsewhere classified (R26.2) ?  ? ?Time: 1000-1015 ?PT Time Calculation (min) (ACUTE ONLY): 15 min ? ? ?Charges:   PT Evaluation ?$PT Eval Low Complexity: 1 Low ?  ?  ?   ? ? ?Marea Reasner H. Owens Shark, PT, DPT, NCS ?10/03/21, 10:29 AM ?[phone] ? ? ?

## 2021-10-04 DIAGNOSIS — N184 Chronic kidney disease, stage 4 (severe): Secondary | ICD-10-CM | POA: Diagnosis not present

## 2021-10-04 DIAGNOSIS — A419 Sepsis, unspecified organism: Secondary | ICD-10-CM | POA: Diagnosis not present

## 2021-10-04 DIAGNOSIS — N390 Urinary tract infection, site not specified: Secondary | ICD-10-CM | POA: Diagnosis not present

## 2021-10-04 DIAGNOSIS — C679 Malignant neoplasm of bladder, unspecified: Secondary | ICD-10-CM | POA: Diagnosis not present

## 2021-10-04 LAB — BASIC METABOLIC PANEL
Anion gap: 5 (ref 5–15)
BUN: 27 mg/dL — ABNORMAL HIGH (ref 8–23)
CO2: 23 mmol/L (ref 22–32)
Calcium: 8.4 mg/dL — ABNORMAL LOW (ref 8.9–10.3)
Chloride: 114 mmol/L — ABNORMAL HIGH (ref 98–111)
Creatinine, Ser: 1.51 mg/dL — ABNORMAL HIGH (ref 0.44–1.00)
GFR, Estimated: 37 mL/min — ABNORMAL LOW (ref >60)
Glucose, Bld: 116 mg/dL — ABNORMAL HIGH (ref 70–99)
Potassium: 3.7 mmol/L (ref 3.5–5.1)
Sodium: 142 mmol/L (ref 135–145)

## 2021-10-04 LAB — URINE CULTURE: Culture: 10000 — AB

## 2021-10-04 LAB — CBC
HCT: 28 % — ABNORMAL LOW (ref 36.0–46.0)
Hemoglobin: 8.5 g/dL — ABNORMAL LOW (ref 12.0–15.0)
MCH: 23.7 pg — ABNORMAL LOW (ref 26.0–34.0)
MCHC: 30.4 g/dL (ref 30.0–36.0)
MCV: 78.2 fL — ABNORMAL LOW (ref 80.0–100.0)
Platelets: 200 10*3/uL (ref 150–400)
RBC: 3.58 MIL/uL — ABNORMAL LOW (ref 3.87–5.11)
RDW: 14.9 % (ref 11.5–15.5)
WBC: 8 10*3/uL (ref 4.0–10.5)
nRBC: 0 % (ref 0.0–0.2)

## 2021-10-04 MED ORDER — SODIUM CHLORIDE 0.9% FLUSH: 10.0000 mL | INTRAVENOUS | Status: DC | PRN

## 2021-10-04 MED ORDER — DICLOFENAC SODIUM 1 % EX GEL
2.0000 g | Freq: Three times a day (TID) | CUTANEOUS | Status: DC
  Administered 2021-10-04 – 2021-10-05 (×4): 2 g via TOPICAL
  Filled 2021-10-04: qty 100

## 2021-10-04 NOTE — Progress Notes (Signed)
Mobility Specialist - Progress Note ? ? 10/04/21 1400  ?Mobility  ?Activity Ambulated independently in hallway  ?Level of Assistance Independent  ?Assistive Device None  ?Activity Response Tolerated well  ?$Mobility charge 1 Mobility  ? ? ? ?Pt ambulated in hallway independently. No complaints. Returned to Engineer, civil (consulting).  ? ? ?Angela Russell ?Mobility Specialist ?10/04/21, 2:27 PM ? ? ? ? ?

## 2021-10-04 NOTE — Consult Note (Signed)
[facility] CM Inpatient Consult ? ? ?10/04/2021 ? ?[name] ?1951-03-24 ?[id number] ? ?[facility] CM) ?  ?Patient was assessed for post hospital chronic care coordination needs with [facility] CM services. Noted high risk score for unplanned readmission.  ? ?Per review, patient is being followed by embedded CCM team at primary provider office. ? ?Plan: Will continue to follow for progression and disposition. ? ?Of note, [facility] Care Management services does not replace or interfere with any services that are arranged by inpatient case management or social work.  ? ?Netta Cedars, MSN, RN ?[facility] Liaison ?Toll free office [phone] ?

## 2021-10-04 NOTE — Progress Notes (Signed)
?PROGRESS NOTE ? ?Angela Russell    DOB: 1950-08-30, 71 y.o.  ?YJE:[id number]  ?  Code Status: Full Code   ?DOA: 10/02/2021   LOS: 2  ? ?Brief hospital course  ?Angela Russell is a 71 y.o. female with a PMH significant for CKD stage IV, diabetes mellitus, hypertension, hyperlipidemia, history of bladder cancer last chemotherapy 2 weeks back. Patient does have history of DVT and PE and had IVC filter in the past which was recently removed and is currently in not on any anticoagulation.history of urostomy from previous bladder cancer surgery.  In the past she was on chemotherapy with affected her kidneys so is currently on Keytruda ? ?They presented from home to the ED on 10/02/2021 with left leg pain, fatigue x a couple days. Denies N/V/abdominal pain. Endorses constipation. ? ?In the ED, it was found that they were tachycardic up to 110s, afebrile, hypertensive to 185/63, and stable on room air. She did not meet sepsis criteria.  ?Significant findings included urinalysis positive for nitrites, large leukocytes, >50 WBCs, negative CTA for PE, no evidence of LE DVT on doppler. Labs were otherwise relatively unremarkable and at baseline. ? ?They were initially treated with IV Abx of vancomycin, cefepime, and flagyl.  ?Patient was admitted to medicine service for further workup and management of UTI as outlined in detail below. ? ?10/04/21 -stable ? ?Assessment & Plan  ?Principal Problem: ?  Sepsis secondary to UTI [facility]) ?Active Problems: ?  Hyperlipidemia ?  Hypertension ?  Bladder carcinoma ([place]) ?  History of DVT of lower extremity ?  Anemia in stage 4 chronic kidney disease ([place]) ?  Controlled diabetes mellitus type 2 with complications ([place]) ?  Urinary tract infection without hematuria ? ?Bacteremia  UTI- patient at high risk due to relative immunocompromised state and h/o bladder carcinoma with urostomy. Blood culture positive for e coli. With susceptibilities pending. UxCx shows multiple sensitivities. Plan to  keep IV Abx another 24 hours and will transition to PO tomorrow when BxCx sensitivities confirmed.  ?- Plan to change her to Levaquin '750mg'$  q48 hours at discharge for 14 days given her immune state.  ?- f/u blood culture sensitivities  ?- continue IV ceftriaxone today ? ?Acute on chronic kidney disease stage IV- patient had worsening Cr from baseline of ~1.4 to 1.7>1.5 today. Infection as well as medication side effect from initial antibiotics possibility. ?- encourage PO hydration ?- metabolic panel am ?- follow up with PCP OP for monitoring ? ?Left calf pain- suspect MSK. No signs of infection, swelling, injury. [place] negative for DVT on admission.  ?- voltaren gel PRN ? ?Bladder carcinoma- s/p urostomy.  ?- continue outpatient management per oncology, Keytruda ? ?H/o DVT- negative LE doppler and CTA on admission ?- continue eliquis ? ?Anemia- at baseline ? ?HTN- hypertensive since admitted ?- continue home medications. Increased amlodipine ? ?Non-insulin dependent DM type II- hgb A1c recently was 6.8 which is well below goal for age og 8.0 ?- discontinue sliding scale ?- continue home medication of semaglutide when able. Not formulary.  ? ?Body mass index is 37.3 kg/m?. ? ?VTE ppx: heparin injection 5,000 Units Start: 10/02/21 2200 ? ? ?Diet:  ?   ?Diet  ? Diet Carb Modified Fluid consistency: Thin; Room service appropriate? Yes  ? ?Consultants: ?None  ?Subjective 10/04/21   ? ?Pt reports feeling well. She continues to have left knee "soreness" when she bears weight. H/o arthroplasty. No swelling or redness. Denies dysuria.  ?  ?Objective  ? ?  Vitals:  ? 10/03/21 0826 10/03/21 1656 10/03/21 1946 10/04/21 0427  ?BP: (!) 152/75 (!) 154/68 (!) 150/69 134/74  ?Pulse: 88 89 90 86  ?Resp: '15 16 18 17  '$ ?Temp: 97.8 ?F (36.6 ?C) 97.7 ?F (36.5 ?C) 98.3 ?F (36.8 ?C) 97.9 ?F (36.6 ?C)  ?TempSrc:   Oral Oral  ?SpO2: 100% 100% 100% 96%  ?Weight:      ?Height:      ? ? ?Intake/Output Summary (Last 24 hours) at 10/04/2021 0733 ?Last  data filed at 10/04/2021 0500 ?Gross per 24 hour  ?Intake 1080 ml  ?Output 1152 ml  ?Net -72 ml  ? ? ?Filed Weights  ? 10/02/21 0951 10/02/21 2101 10/03/21 0500  ?Weight: 95.3 kg 95.5 kg 95.5 kg  ?  ? ?Physical Exam:  ?General: awake, alert, NAD ?HEENT: atraumatic, clear conjunctiva, anicteric sclera, MMM, hearing grossly normal ?Respiratory: normal respiratory effort. ?Cardiovascular: quick capillary refill, normal S1/S2, RRR, no JVD, murmurs ?Gastrointestinal: soft, NT, ND ?Nervous: A&O x3. no gross focal neurologic deficits, normal speech ?Extremities: moves all equally, no edema, normal tone. Mild tenderness to palpation of left lateral knee. No deformity or skin changes observed. ROM and strength intact ?Skin: dry, intact, normal temperature, normal color. No rashes, lesions or ulcers on exposed skin ?Psychiatry: anxious mood, congruent affect ? ?Labs   ?I have personally reviewed the following labs and imaging studies ?CBC ?   ?Component Value Date/Time  ? WBC 8.0 10/04/2021 0615  ? RBC 3.58 (L) 10/04/2021 0615  ? HGB 8.5 (L) 10/04/2021 0615  ? HGB 10.8 (L) 07/14/2021 0818  ? HCT 28.0 (L) 10/04/2021 0615  ? HCT 34.7 07/14/2021 0818  ? PLT 200 10/04/2021 0615  ? PLT 257 07/14/2021 0818  ? MCV 78.2 (L) 10/04/2021 0615  ? MCV 77 (L) 07/14/2021 0818  ? MCH 23.7 (L) 10/04/2021 0615  ? MCHC 30.4 10/04/2021 0615  ? RDW 14.9 10/04/2021 0615  ? RDW 13.6 07/14/2021 0818  ? LYMPHSABS 0.5 (L) 10/02/2021 1017  ? LYMPHSABS 1.2 07/14/2021 0818  ? MONOABS 0.3 10/02/2021 1017  ? EOSABS 0.0 10/02/2021 1017  ? EOSABS 0.2 07/14/2021 0818  ? BASOSABS 0.0 10/02/2021 1017  ? BASOSABS 0.0 07/14/2021 0818  ? ? ?  Latest Ref Rng & Units 10/04/2021  ?  6:15 AM 10/03/2021  ?  6:25 AM 10/02/2021  ? 10:17 AM  ?BMP  ?Glucose 70 - 99 mg/dL 116   100   121    ?BUN 8 - 23 mg/dL 27   27   32    ?Creatinine 0.44 - 1.00 mg/dL 1.51   1.70   1.66    ?Sodium 135 - 145 mmol/L 142   144   140    ?Potassium 3.5 - 5.1 mmol/L 3.7   3.5   3.8    ?Chloride 98 -  111 mmol/L 114   117   110    ?CO2 22 - 32 mmol/L '23   21   23    '$ ?Calcium 8.9 - 10.3 mg/dL 8.4   8.1   9.0    ? ? ?CT Angio Chest PE W and/or Wo Contrast ? ?Result Date: 10/02/2021 ?CLINICAL DATA:  Pulmonary embolism (PE) suspected, high prob EXAM: CT ANGIOGRAPHY CHEST WITH CONTRAST TECHNIQUE: Multidetector CT imaging of the chest was performed using the standard protocol during bolus administration of intravenous contrast. Multiplanar CT image reconstructions and MIPs were obtained to evaluate the vascular anatomy. RADIATION DOSE REDUCTION: This exam was performed according to  the departmental dose-optimization program which includes automated exposure control, adjustment of the mA and/or kV according to patient size and/or use of iterative reconstruction technique. CONTRAST:  52m OMNIPAQUE IOHEXOL 350 MG/ML SOLN COMPARISON:  CT chest/abdomen/pelvis July 31, 2021. FINDINGS: Cardiovascular: No evidence of pulmonary embolism to the proximal segmental level. More distal evaluation is limited by respiratory motion. Normal heart size. No pericardial effusion. Atherosclerosis. Aberrant right subclavian artery passes behind the esophagus. Mediastinum/Nodes: Stable chronic anterior mediastinal mass which was non hypermetabolic on prior PET CT. Similar mediastinal and hilar lymph nodes which are not pathologically enlarged by CT size criteria. Thyroid gland, trachea, and esophagus demonstrate no significant findings. Lungs/Pleura: Unchanged 3 mm in the right upper lobe, stable over multiple priors. No consolidation. Similar areas of bronchial wall thickening. No pleural effusions or pneumothorax. Upper Abdomen: No acute abnormality. Musculoskeletal: No chest wall abnormality. No acute or significant osseous findings. Review of the MIP images confirms the above findings. IMPRESSION: 1. No evidence of pulmonary embolism to the proximal segmental level. More distal evaluation is limited by respiratory motion. 2. Similar  emphysema (ICD10-J43.9) and diffuse bronchial wall thickening. Electronically Signed   By: FMargaretha SheffieldM.D.   On: 10/02/2021 12:25  ? ?UK[place]Venous Img Lower Unilateral Left ? ?Result Date: 10/02/2021 ?CLINI

## 2021-10-05 DIAGNOSIS — N390 Urinary tract infection, site not specified: Secondary | ICD-10-CM | POA: Diagnosis not present

## 2021-10-05 DIAGNOSIS — A419 Sepsis, unspecified organism: Secondary | ICD-10-CM | POA: Diagnosis not present

## 2021-10-05 LAB — BASIC METABOLIC PANEL
Anion gap: 9 (ref 5–15)
BUN: 25 mg/dL — ABNORMAL HIGH (ref 8–23)
CO2: 23 mmol/L (ref 22–32)
Calcium: 8.6 mg/dL — ABNORMAL LOW (ref 8.9–10.3)
Chloride: 109 mmol/L (ref 98–111)
Creatinine, Ser: 1.42 mg/dL — ABNORMAL HIGH (ref 0.44–1.00)
GFR, Estimated: 39 mL/min — ABNORMAL LOW (ref >60)
Glucose, Bld: 106 mg/dL — ABNORMAL HIGH (ref 70–99)
Potassium: 3.7 mmol/L (ref 3.5–5.1)
Sodium: 141 mmol/L (ref 135–145)

## 2021-10-05 LAB — CULTURE, BLOOD (ROUTINE X 2): Special Requests: ADEQUATE

## 2021-10-05 MED ORDER — SENNOSIDES-DOCUSATE SODIUM 8.6-50 MG PO TABS: 1.0000 | ORAL_TABLET | Freq: Two times a day (BID) | ORAL | 0 refills | Status: DC

## 2021-10-05 MED ORDER — LEVOFLOXACIN 750 MG PO TABS
750.0000 mg | ORAL_TABLET | ORAL | Status: DC
  Administered 2021-10-05: 750 mg via ORAL
  Filled 2021-10-05: qty 1

## 2021-10-05 MED ORDER — MELATONIN 5 MG PO TABS
5.0000 mg | ORAL_TABLET | Freq: Once | ORAL | Status: DC
Start: 2021-10-05 — End: 2021-10-05
  Filled 2021-10-05: qty 1

## 2021-10-05 MED ORDER — POLYETHYLENE GLYCOL 3350 17 G PO PACK: 17.0000 g | PACK | Freq: Two times a day (BID) | ORAL | 0 refills | Status: DC

## 2021-10-05 MED ORDER — AMLODIPINE BESYLATE 10 MG PO TABS
10.0000 mg | ORAL_TABLET | Freq: Every day | ORAL | 0 refills | Status: DC
Start: 2021-10-06 — End: 2021-12-11

## 2021-10-05 MED ORDER — SENNOSIDES-DOCUSATE SODIUM 8.6-50 MG PO TABS
1.0000 | ORAL_TABLET | Freq: Two times a day (BID) | ORAL | Status: DC
  Administered 2021-10-05: 1 via ORAL
  Filled 2021-10-05: qty 1

## 2021-10-05 MED ORDER — BISACODYL 5 MG PO TBEC: 5.0000 mg | DELAYED_RELEASE_TABLET | Freq: Every day | ORAL | 0 refills | Status: DC | PRN

## 2021-10-05 MED ORDER — LEVOFLOXACIN 750 MG PO TABS
750.0000 mg | ORAL_TABLET | ORAL | 0 refills | Status: AC
Start: 2021-10-07 — End: 2021-10-19

## 2021-10-05 MED ORDER — BISACODYL 5 MG PO TBEC
5.0000 mg | DELAYED_RELEASE_TABLET | Freq: Every day | ORAL | Status: DC | PRN
  Administered 2021-10-05: 5 mg via ORAL
  Filled 2021-10-05: qty 1

## 2021-10-05 MED ORDER — HEPARIN SOD (PORK) LOCK FLUSH 100 UNIT/ML IV SOLN
500.0000 [IU] | Freq: Once | INTRAVENOUS | Status: AC
  Administered 2021-10-05: 500 [IU] via INTRAVENOUS
  Filled 2021-10-05: qty 5

## 2021-10-05 NOTE — Care Management (Signed)
?  Transition of Care (TOC) Screening Note ? ? ?Patient Details  ?Name: Angela Russell ?Date of Birth: 10-14-1950 ? ? ?Transition of Care (TOC) CM/SW Contact:    ?Pete Pelt, RN ?Phone Number: ?10/05/2021, 2:30 PM ? ? ? ?Transition of Care Department [facility]) has reviewed patient and no TOC needs have been identified at this time. We will continue to monitor patient advancement through interdisciplinary progression rounds. If new patient transition needs arise, please place a TOC consult. ?  ? ? ?

## 2021-10-05 NOTE — Care Management Important Message (Signed)
Important Message ? ?Patient Details  ?Name: Angela Russell ?MRN: [id number] ?Date of Birth: 02/03/1951 ? ? ?Medicare Important Message Given:  Yes ? ? ? ? ?Juliann Pulse A Lexie Koehl ?10/05/2021, 11:38 AM ?

## 2021-10-05 NOTE — Discharge Summary (Signed)
?Physician Discharge Summary ?  ?Patient: Angela Russell MRN: [id number] DOB: 03/28/1951  ?Admit date:     10/02/2021  ?Discharge date: 10/05/21  ?Discharge Physician: Ezekiel Slocumb  ? ?PCP: [name], NP  ? ?Recommendations at discharge:  ? ? Follow up with primary care in 1 to 2 weeks ?Repeat CBC, BMP in 1 to 2 weeks ?Follow-up with oncology as scheduled ?Follow-up on resolution of UTI with bacteremia after antibiotics complete ? ?Discharge Diagnoses: ?Principal Problem: ?  Sepsis secondary to UTI [facility]) ?Active Problems: ?  Hyperlipidemia ?  Hypertension ?  Bladder carcinoma ([place]) ?  History of DVT of lower extremity ?  Anemia in stage 4 chronic kidney disease ([place]) ?  Controlled diabetes mellitus type 2 with complications ([place]) ?  Urinary tract infection without hematuria ?  Sepsis ([place]) ? ? ?Hospital Course: ?Angela Russell is a 71 y.o. female with past medical history of CKD stage IV, diabetes mellitus, hypertension, hyperlipidemia, history of bladder cancer last chemotherapy 2 weeks back presented to hospital with left leg swelling and pain associated with generalized body ache, fatigue and weakness.  Patient does have history of DVT and PE and had IVC filter in the past which was recently removed and is currently in not on any anticoagulation.  Patient also complained of mild chest tightness but no shortness of breath dyspnea cough.  Patient does have history of urostomy from previous bladder cancer surgery.  In the past she was on chemotherapy with affected her kidneys so is currently on Keytruda.  Patient denies any dizziness, lightheadedness or syncope but has generalized fatigue and weakness.  Complains of chills but denies obvious fever at home.  Denies any nausea vomiting or abdominal pain.  States that she is largely constipated.  Denies any sick contacts or recent travel.  At baseline patient states that she is able to ambulate by herself. ? ?In the ED, patient was noted to be febrile with  a temperature of 100 ?F, was mildly tachycardic with elevated lactate.  WBC was 10.3.  Urinalysis was abnormal.  Blood culture and urine culture were sent from the ED. ultrasound of the left lower extremity was negative for DVT.  CT angiogram of the chest was negative for PE or pneumonia.  Patient was given IV fluid bolus for sepsis and broad-spectrum antibiotic including vancomycin cefepime and metronidazole was given.  COVID and influenza was negative.  Patient was then considered for admission to the hospital for further evaluation and treatment. ? ?Assessment and Plan: ?Bacteremia  UTI- patient at high risk due to relative immunocompromised state and h/o bladder carcinoma with urostomy. Blood culture positive for e coli. With susceptibilities pending. UxCx shows multiple sensitivities. Plan to keep IV Abx another 24 hours and will transition to PO tomorrow when BxCx sensitivities confirmed.  ?- f/u blood culture sensitivities  ?-Transition IV ceftriaxone to p.o. Levaquin 750 mg every 48 hours to complete 14-day course given immunocompromise status ?  ?Acute on chronic kidney disease stage IV- patient had worsening Cr from baseline of ~1.4 to 1.7>1.5 today. Infection as well as medication side effect from initial antibiotics possibility. ?- encourage PO hydration ?- metabolic panel am ?- follow up with PCP OP for monitoring ?  ?Left calf pain- suspect MSK. No signs of infection, swelling, injury. [place] negative for DVT on admission.  ?- voltaren gel PRN ?  ?Bladder carcinoma- s/p urostomy.  ?- continue outpatient management per oncology, Keytruda ?  ?H/o DVT- negative LE doppler and CTA  on admission ?- continue eliquis ?  ?Anemia- at baseline ?  ?HTN- hypertensive since admitted ?- continue home medications. Increased amlodipine to 10 mg daily ?  ?Non-insulin dependent DM type II-  ?hgb A1c recently was 6.8 which is well below goal for age og 8.0 ?-Covered with sliding scale NovoLog during admission ?-Resume  semaglutide at dischargeObesity  - body mass index is 37.3 kg/m?[place] Kitchen ?Complicates overall care and prognosis.  Recommend lifestyle modifications including physical activity and diet for weight loss and overall long-term health. ? ? ? ?  ? ? ?Consultants: None ?Procedures performed: None ?Disposition: Home ?Diet recommendation:  ?Discharge Diet Orders (From admission, onward)  ? ?  Start     Ordered  ? 10/05/21 0000  Diet - low sodium heart healthy       ? 10/05/21 1258  ? ?  ?  ? ?  ? ?Carb modified diet ?DISCHARGE MEDICATION: ?Allergies as of 10/05/2021   ?No Known Allergies ?  ? ?  ?Medication List  ?  ? ?STOP taking these medications   ? ?benzonatate 100 MG capsule ?Commonly known as: TESSALON ?  ?doxycycline 100 MG capsule ?Commonly known as: VIBRAMYCIN ?  ?lidocaine-prilocaine cream ?Commonly known as: EMLA ?  ? ?  ? ?TAKE these medications   ? ?AeroChamber MV inhaler ?Use as instructed ?  ?albuterol 108 (90 Base) MCG/ACT inhaler ?Commonly known as: VENTOLIN HFA ?Inhale 2 puffs into the lungs every 4 (four) hours as needed. ?  ?amLODipine 10 MG tablet ?Commonly known as: NORVASC ?Take 1 tablet (10 mg total) by mouth daily. ?Start taking on: Oct 06, 2021 ?What changed:  ?medication strength ?how much to take ?  ?bisacodyl 5 MG EC tablet ?Commonly known as: DULCOLAX ?Take 1 tablet (5 mg total) by mouth daily as needed for moderate constipation. ?  ?docusate sodium 100 MG capsule ?Commonly known as: Colace ?Take 1 capsule (100 mg total) by mouth daily. ?  ?FISH OIL PO ?Take by mouth. ?  ?levofloxacin 750 MG tablet ?Commonly known as: LEVAQUIN ?Take 1 tablet (750 mg total) by mouth every other day for 12 days. Start on 10/07/2021 ?Start taking on: Oct 07, 2021 ?  ?losartan 25 MG tablet ?Commonly known as: COZAAR ?Take 1 tablet (25 mg total) by mouth daily. ?  ?pembrolizumab 100 MG/4ML Soln ?Commonly known as: KEYTRUDA ?Inject 200 mg into the vein. Every 42 days,    Last txy 4/05, Next 5/03 ?  ?polyethylene glycol 17 g  packet ?Commonly known as: MIRALAX / GLYCOLAX ?Take 17 g by mouth 2 (two) times daily. ?  ?promethazine-dextromethorphan 6.25-15 MG/5ML syrup ?Commonly known as: PROMETHAZINE-DM ?Take 5 mLs by mouth 4 (four) times daily as needed. ?  ?Rybelsus 3 MG Tabs ?Generic drug: Semaglutide ?Take 1 tablet by mouth daily. ?  ?senna-docusate 8.6-50 MG tablet ?Commonly known as: Senokot-S ?Take 1 tablet by mouth 2 (two) times daily. ?What changed: when to take this ?  ? ?  ? ? ?Discharge Exam: ?Filed Weights  ? 10/02/21 0951 10/02/21 2101 10/03/21 0500  ?Weight: 95.3 kg 95.5 kg 95.5 kg  ? ?General exam: awake, alert, no acute distress ?HEENT: moist mucus membranes, hearing grossly normal  ?Respiratory system: CTAB, no wheezes, rales or rhonchi, normal respiratory effort. ?Cardiovascular system: normal S1/S2, RRR, no pedal edema.   ?Central nervous system: A&O . no gross focal neurologic deficits, normal speech ?Extremities: moves all, no edema, normal tone ?Skin: dry, intact, normal temperature ?Psychiatry: normal mood, congruent affect, judgement and insight  appear normal ? ? ?Condition at discharge: stable ? ?The results of significant diagnostics from this hospitalization (including imaging, microbiology, ancillary and laboratory) are listed below for reference.  ? ?Imaging Studies: ?CT Angio Chest PE W and/or Wo Contrast ? ?Result Date: 10/02/2021 ?CLINICAL DATA:  Pulmonary embolism (PE) suspected, high prob EXAM: CT ANGIOGRAPHY CHEST WITH CONTRAST TECHNIQUE: Multidetector CT imaging of the chest was performed using the standard protocol during bolus administration of intravenous contrast. Multiplanar CT image reconstructions and MIPs were obtained to evaluate the vascular anatomy. RADIATION DOSE REDUCTION: This exam was performed according to the departmental dose-optimization program which includes automated exposure control, adjustment of the mA and/or kV according to patient size and/or use of iterative reconstruction  technique. CONTRAST:  7m OMNIPAQUE IOHEXOL 350 MG/ML SOLN COMPARISON:  CT chest/abdomen/pelvis July 31, 2021. FINDINGS: Cardiovascular: No evidence of pulmonary embolism to the proximal segmental level. More di

## 2021-10-07 DIAGNOSIS — Z20822 Contact with and (suspected) exposure to covid-19: Secondary | ICD-10-CM | POA: Diagnosis not present

## 2021-10-07 LAB — CULTURE, BLOOD (ROUTINE X 2)
Culture: NO GROWTH
Special Requests: ADEQUATE

## 2021-10-17 ENCOUNTER — Telehealth: Payer: Self-pay | Admitting: Nurse Practitioner

## 2021-10-17 NOTE — Telephone Encounter (Signed)
Copied from [place] [phone]. Topic: Medicare AWV ?>> Oct 17, 2021  1:49 PM Lavonia Drafts wrote: ?Reason for CRM:  ?Left message for patient to call back and schedule the Medicare Annual Wellness Visit (AWV) virtually or by telephone. ? ?Last AWV 06/22/19 ? ?Please schedule at anytime with [facility] Advisor. ? ?30 minute appointment ? ?Any questions, please call me at [phone] ?

## 2021-10-27 ENCOUNTER — Other Ambulatory Visit: Payer: Medicare Other

## 2021-10-31 ENCOUNTER — Inpatient Hospital Stay: Payer: Medicare Other | Attending: Oncology

## 2021-10-31 ENCOUNTER — Inpatient Hospital Stay (HOSPITAL_BASED_OUTPATIENT_CLINIC_OR_DEPARTMENT_OTHER): Payer: Medicare Other | Admitting: Oncology

## 2021-10-31 ENCOUNTER — Encounter: Payer: Self-pay | Admitting: Oncology

## 2021-10-31 VITALS — BP 149/89 | HR 103 | Temp 97.4°F | Resp 16 | Wt 210.0 lb

## 2021-10-31 DIAGNOSIS — I129 Hypertensive chronic kidney disease with stage 1 through stage 4 chronic kidney disease, or unspecified chronic kidney disease: Secondary | ICD-10-CM | POA: Insufficient documentation

## 2021-10-31 DIAGNOSIS — E079 Disorder of thyroid, unspecified: Secondary | ICD-10-CM | POA: Insufficient documentation

## 2021-10-31 DIAGNOSIS — D631 Anemia in chronic kidney disease: Secondary | ICD-10-CM | POA: Diagnosis not present

## 2021-10-31 DIAGNOSIS — Z1329 Encounter for screening for other suspected endocrine disorder: Secondary | ICD-10-CM

## 2021-10-31 DIAGNOSIS — Z87891 Personal history of nicotine dependence: Secondary | ICD-10-CM | POA: Insufficient documentation

## 2021-10-31 DIAGNOSIS — D494 Neoplasm of unspecified behavior of bladder: Secondary | ICD-10-CM | POA: Diagnosis not present

## 2021-10-31 DIAGNOSIS — N1832 Chronic kidney disease, stage 3b: Secondary | ICD-10-CM

## 2021-10-31 DIAGNOSIS — E1122 Type 2 diabetes mellitus with diabetic chronic kidney disease: Secondary | ICD-10-CM | POA: Insufficient documentation

## 2021-10-31 DIAGNOSIS — C679 Malignant neoplasm of bladder, unspecified: Secondary | ICD-10-CM | POA: Insufficient documentation

## 2021-10-31 DIAGNOSIS — C7910 Secondary malignant neoplasm of unspecified urinary organs: Secondary | ICD-10-CM | POA: Diagnosis not present

## 2021-10-31 DIAGNOSIS — Z95828 Presence of other vascular implants and grafts: Secondary | ICD-10-CM

## 2021-10-31 MED ORDER — SODIUM CHLORIDE 0.9% FLUSH
10.0000 mL | Freq: Once | INTRAVENOUS | Status: AC
  Administered 2021-10-31: 10 mL via INTRAVENOUS
  Filled 2021-10-31: qty 10

## 2021-10-31 MED ORDER — HEPARIN SOD (PORK) LOCK FLUSH 100 UNIT/ML IV SOLN
500.0000 [IU] | Freq: Once | INTRAVENOUS | Status: AC
  Administered 2021-10-31: 500 [IU] via INTRAVENOUS
  Filled 2021-10-31: qty 5

## 2021-10-31 NOTE — Progress Notes (Signed)
Hematology/Oncology follow up note [phone] [phone]   Patient Care Team: [name], NP as PCP - General Nice, [name], OD (Optometry) [name], MD as Consulting Physician (Hematology and Oncology) [name], [facility] (Inactive) (Pharmacist)   CHIEF COMPLAINTS/REASON FOR VISIT:  Follow up for bladder cancer, anemia.   HISTORY OF PRESENTING ILLNESS:  Angela Russell is a  71 y.o.  female with PMH listed below who was referred to me for evaluation of newly diagnosed bladder cancer. Patient initially presented to emergency room at the end of January 2020 for evaluation of dysuria, hematuria and the left lower quadrant inguinal pain and flank pain.  1/28 2020 CT renal stone study showed suspected irregular wall thickening about the superior bladder, not well assessed due to degree of bladder distention.  Recommend cystoscopy for further evaluation.  No renal stone or obstructive uropathy.  Patient was given IV Rocephin and referred patient for outpatient urology follow-up.  Urine culture was negative.  She again presented to ER after 2 days with similar symptoms.  Patient has 25-pack-year smoking history, quit approximately 20 years ago.  No family history of any urology malignancies 07/03/2018 another CT abdomen pelvis with contrast was done which showed no nephrolithiasis or hydronephrosis is identified.  Bladder is decompressed limiting evaluation.  07/16/2018.urology Dr. [name] - cystoscopy and bilateral retrograde pyelogram on 07/16/2018.  Pyelogram did not show any filling defect or abnormalities.  No hydronephrosis.  Ureteral orifice was not involved with tumor.  There is a large 5 cm posterior wall bladder tumor, bullous and sessile appearing.  Patient underwent TURBT.   Pathology: High-grade urothelial carcinoma, invasive into muscularis propria.  Lymphovascular invasion is present.  Carcinoma in situ is also identified.  Focal squamous differentiation is  noted, areas of invasive carcinoma display pleomorphic/sarcomatoid changes.  T2b  08/07/2018 CT without contrast negative.  2 subpleural right upper lobe nodule 2 to 3 mm likely benign. She also had baseline audiometry done.  08/04/2018 ddMVAC x 1 cycle, stopped due to intolerance and AKI.  Patient received 1 cycle of dd MVAC, not able to tolerate due to AKI. Patient then was [facility] to [facility] urology  09/22/2018 patient underwent a cystectomy, pathology pT3a N0 Mx.  11 lymph nodes were harvested and was all negative. Invasive urothelia carcinoma, high grade, with sarcomatoid features.   10/14/2018 patient was admitted due to pyelonephritis and a pelvic fluid collection.  Drain was placed. 10/23/2018 drain was removed. Patient has had difficulties getting to her appointments to [facility]. 02/09/2019, patient presented with abdominal pain. CT concerning for small bowel obstruction and increased size of right pelvic fluid collection concerning for cancer recurrence.  Right hydronephrosis to the level of pelvis and enlarged lymph nodes. Patient had JP drain placed with CT guidance to pelvic fluid collection and drained 400 cc amber fluid.  Culture was negative for growth of microorganisms and cytology was negative for malignancy.-JP drain was removed on the day of discharge. CT-guided core biopsy of pelvic lymph node adenopathy was attempted but not successful.  # Right lower extremity DVT, provoked by Transvenous biopsy  02/26/2019 transvenous biopsy by IR unsuccessful transvenous biopsy of the right pelvis mass. Post biopsy acute thrombus in the right external iliac and common femoral vein.  Patient was recommended to start anticoagulation with Xarelto however due to the co-pay, patient is not able to afford the medication. Anticoagulation regimen was switched to Eliquis 5 mg.  Patient reports that she has been taking it once a  day. 03/20/2019 PET showed FDG avid tissue in the cystectomy bed,  retroperitoneal and pelvic lymphadenopathy.  Right common iliac DVT.  # increased right lower extremity swelling.  She was started on Eliquis for anticoagulation by [facility].  We clarified with her pharmacy and she was actually taking Eliquis 2.5 mg twice daily. Right lower extremity swelling has not improved but instead worsened. 03/16/2019 She had ultrasound right lower extremity done which showed persistent extensive proximal right lower extremity DVT.  Anticoagulation regimen has increased to Eliquis 5 mg twice daily.  # establish care with [facility] oncology Dr. [name] for evaluation.  Dr. [name] recommended starting immunotherapy with PD-L1 inhibitor Pembrolizumab 200 mg every 3 weeks.  The sarcomatoid histology may not respond to chemotherapy well, could portend a better chance of response into immunotherapy PET scan after 4 cycles of Keytruda showed single mildly enlarged central mesenteric lymph node in the upper pelvis with SUV 9.6.  No other abnormal hypermetabolic activity was reported.  # #Right lower extremity  femoral vein DVT provoked by transvenous biopsy in the context of cancer recurrence, Status post IV C filter and mechanical thrombectomy.  IVC filter has been removed. She is currently off anticoagulation after she developed hematuria.  # 03/30/2019 Status post IVC filter placement, mechanical thrombectomy.-IVC filter was retrieved in January 2021. # PD-L1 CPS 100%.  # 03/31/2019 started on immunotherapy Keytruda.  # 04/26/2020 CT chest abdomen pelvis was reviewed and discussed with patient. No definitive finding of disease recurrence or metastasis.  Small amount of ascites similar to prior.There is a parastomal hernia containing a small margin of adjacent small bowel.  This is associated with wall thickening in approximately 7 cm segment of bowel adjacent to the hernia.  There is adjacent abnormal stranding of the mesentery and omentum.  # 08/19/2020, CT chest abdomen pelvis showed  no evidence of new/progressive metastatic disease. NED Hold off treatment due to uncontrolled hypotension.  # Chronic abdominal discomfort due to hernia. 08/11/2020, patient was seen by Dr. Dolphus Jenny for parastomal hernia.  Patient has no obstructive symptoms and would like to continue to monitor her symptoms and hold off any intervention at this point.  Patient was recommended to wear ostomy hernia belt and was referred to [facility] wound ostomy nursing team.  04/10/2021 CT chest abdomen pelvis without contrast showed no evidence of disease recurrence.  Small volume fluid in the pelvis, decreased.  Emphysema/diffuse bilateral bronchial wall thickening.  CAD.  She received IV venofer previously and did not tolerate due to energized joint pain afterwards.  #Hernia -parastomal hernia.   She has establish care with [facility] hernia center.  She was recommended to monitor her symptoms and aware ostomy belt  07/31/2021, CT chest abdomen pelvis without contrast showed NED. 09/05/2021 cycle Keytruda.  Shared decision was made to stop Keytruda treatment after 09/05/2021 and continue surveillance.   INTERVAL HISTORY Angela Russell is a 71 y.o. female who has above history reviewed by me today presents for follow up visit for evaluation form metastatic high-grade urothelial carcinoma of the bladder. Sarcomatoid feature.  10/02/2021 - 10/05/2021, patient was admitted due to sepsis secondary to UTI.  Patient also had CT chest angiogram done during the hospitalization which showed no PE or pneumonia..  Lower extremity ultrasound showed negative for DVT.  Patient was provided supportive care IV fluid hydration, broad-spectrum antibiotics.  COVID and influenza negative.  Blood culture positive for E. coli.  Patient was transitioned to oral antibiotics Levaquin and completed 14 days of course.  Today patient reports feeling well at baseline.  Denies any nausea vomiting fever or chills.  Review of Systems  Constitutional:  Negative  for appetite change, chills, fatigue and fever.  HENT:   Negative for hearing loss and voice change.   Eyes:  Negative for eye problems.  Respiratory:  Negative for chest tightness and cough.   Cardiovascular:  Negative for chest pain and leg swelling.  Gastrointestinal:  Negative for abdominal distention, abdominal pain, blood in stool and constipation.  Endocrine: Negative for hot flashes.  Genitourinary:  Negative for difficulty urinating and frequency.   Musculoskeletal:  Positive for arthralgias.  Skin:  Negative for itching and rash.  Neurological:  Positive for numbness. Negative for extremity weakness.  Hematological:  Negative for adenopathy.  Psychiatric/Behavioral:  Negative for confusion. The patient is not nervous/anxious.    MEDICAL HISTORY:  Past Medical History:  Diagnosis Date   Carotid artery plaque, right 01/2014   CKD (chronic kidney disease)    stage 2-3   Controlled diabetes mellitus type 2 with complications (HCC)    Diabetes mellitus without complication (HCC)    DM (diabetes mellitus), type 2, uncontrolled    Hyperlipidemia    Hypertension    Hypochromic microcytic anemia    mild   Metastatic urothelial carcinoma ([place]) 03/23/2019   Osteoporosis    Personal history of colonic polyps    Renal insufficiency    Rotator cuff tendonitis, right     SURGICAL HISTORY: Past Surgical History:  Procedure Laterality Date   COLONOSCOPY WITH PROPOFOL N/A 03/17/2021   Procedure: COLONOSCOPY WITH PROPOFOL;  Surgeon: Jonathon Bellows, MD;  Location: [facility] ENDOSCOPY;  Service: Gastroenterology;  Laterality: N/A;   CYSTOSCOPY W/ RETROGRADES Bilateral 07/16/2018   Procedure: CYSTOSCOPY WITH RETROGRADE PYELOGRAM;  Surgeon: Billey Co, MD;  Location: [facility] ORS;  Service: Urology;  Laterality: Bilateral;   ESOPHAGOGASTRODUODENOSCOPY  03/17/2021   Procedure: ESOPHAGOGASTRODUODENOSCOPY (EGD);  Surgeon: Jonathon Bellows, MD;  Location: [facility] ENDOSCOPY;  Service: Gastroenterology;;    GIVENS CAPSULE STUDY N/A 06/19/2021   Procedure: GIVENS CAPSULE STUDY;  Surgeon: Jonathon Bellows, MD;  Location: [facility] ENDOSCOPY;  Service: Gastroenterology;  Laterality: N/A;   IVC FILTER INSERTION N/A 03/30/2019   Procedure: IVC FILTER INSERTION;  Surgeon: Algernon Huxley, MD;  Location: [place] CV LAB;  Service: Cardiovascular;  Laterality: N/A;   IVC FILTER REMOVAL N/A 06/15/2019   Procedure: IVC FILTER REMOVAL;  Surgeon: Algernon Huxley, MD;  Location: [place] CV LAB;  Service: Cardiovascular;  Laterality: N/A;   KNEE SURGERY Left 03/17/2013   torn meniscus   PERIPHERAL VASCULAR THROMBECTOMY Right 03/30/2019   Procedure: PERIPHERAL VASCULAR THROMBECTOMY;  Surgeon: Algernon Huxley, MD;  Location: [place] CV LAB;  Service: Cardiovascular;  Laterality: Right;   PORTA CATH INSERTION N/A 08/06/2018   Procedure: PORTA CATH INSERTION;  Surgeon: Algernon Huxley, MD;  Location: [place] CV LAB;  Service: Cardiovascular;  Laterality: N/A;   TOTAL ABDOMINAL HYSTERECTOMY  2000   due to bleeding and fibroids, partial- still has ovaries   TRANSURETHRAL RESECTION OF BLADDER TUMOR N/A 07/16/2018   Procedure: TRANSURETHRAL RESECTION OF BLADDER TUMOR (TURBT);  Surgeon: Billey Co, MD;  Location: [facility] ORS;  Service: Urology;  Laterality: N/A;    SOCIAL HISTORY: Social History   Socioeconomic History   Marital status: Single    Spouse name: Not on file   Number of children: 1   Years of education: Not on file   Highest education level: 10th grade  Occupational History   Not on file  Tobacco Use   Smoking status: Former    Types: Cigarettes    Quit date: 06/05/1991    Years since quitting: 30.4   Smokeless tobacco: Never  Vaping Use   Vaping Use: Never used  Substance and Sexual Activity   Alcohol use: No   Drug use: No   Sexual activity: Not Currently  Other Topics Concern   Not on file  Social History Narrative   Working full time   Social Determinants of Adult nurse Strain: Not on file  Food Insecurity: Not on file  Transportation Needs: Not on file  Physical Activity: Not on file  Stress: Not on file  Social Connections: Not on file  Intimate Partner Violence: Not on file    FAMILY HISTORY: Family History  Problem Relation Age of Onset   Heart disease Mother    Heart attack Mother    Arthritis Father    Diabetes Brother     ALLERGIES:  has No Known Allergies.  MEDICATIONS:  Current Outpatient Medications  Medication Sig Dispense Refill   albuterol (VENTOLIN HFA) 108 (90 Base) MCG/ACT inhaler Inhale 2 puffs into the lungs every 4 (four) hours as needed. 18 g 0   amLODipine (NORVASC) 10 MG tablet Take 1 tablet (10 mg total) by mouth daily. 30 tablet 0   bisacodyl (DULCOLAX) 5 MG EC tablet Take 1 tablet (5 mg total) by mouth daily as needed for moderate constipation. 30 tablet 0   docusate sodium (COLACE) 100 MG capsule Take 1 capsule (100 mg total) by mouth daily. 90 capsule 0   losartan (COZAAR) 25 MG tablet Take 1 tablet (25 mg total) by mouth daily. 90 tablet 1   Omega-3 Fatty Acids (FISH OIL PO) Take by mouth.     polyethylene glycol (MIRALAX / GLYCOLAX) 17 g packet Take 17 g by mouth 2 (two) times daily. 14 each 0   Semaglutide (RYBELSUS) 3 MG TABS Take 1 tablet by mouth daily. 90 tablet 1   senna-docusate (SENOKOT-S) 8.6-50 MG tablet Take 1 tablet by mouth 2 (two) times daily. 60 tablet 0   Spacer/Aero-Holding Chambers (AEROCHAMBER MV) inhaler Use as instructed 1 each 2   pembrolizumab (KEYTRUDA) 100 MG/4ML SOLN Inject 200 mg into the vein. Every 42 days,    Last txy 4/05, Next 5/03     promethazine-dextromethorphan (PROMETHAZINE-DM) 6.25-15 MG/5ML syrup Take 5 mLs by mouth 4 (four) times daily as needed. (Patient not taking: Reported on 10/31/2021) 118 mL 0   No current facility-administered medications for this visit.   Facility-Administered Medications Ordered in Other Visits  Medication Dose Route Frequency  Provider Last Rate Last Admin   heparin lock flush 100 UNIT/ML injection              PHYSICAL EXAMINATION: ECOG PERFORMANCE STATUS: 1 - Symptomatic but completely ambulatory Vitals:   10/31/21 0949  BP: (!) 149/89  Pulse: (!) 103  Resp: 16  Temp: (!) 97.4 F (36.3 C)  SpO2: 100%   Filed Weights   10/31/21 0949  Weight: 210 lb (95.3 kg)    Physical Exam Constitutional:      General: She is not in acute distress.    Comments: Walk independently  HENT:     Head: Normocephalic and atraumatic.  Eyes:     General: No scleral icterus.    Pupils: Pupils are equal, round, and reactive to light.  Cardiovascular:     Rate and  Rhythm: Normal rate and regular rhythm.     Heart sounds: Normal heart sounds.  Pulmonary:     Effort: Pulmonary effort is normal. No respiratory distress.     Breath sounds: No wheezing.  Abdominal:     General: Bowel sounds are normal. There is no distension.     Palpations: Abdomen is soft. There is no mass.     Comments: Ureterostomy,   Musculoskeletal:        General: No deformity. Normal range of motion.     Cervical back: Normal range of motion and neck supple.  Skin:    General: Skin is warm and dry.     Coloration: Skin is not pale.     Findings: No erythema or rash.  Neurological:     Mental Status: She is alert and oriented to person, place, and time. Mental status is at baseline.     Cranial Nerves: No cranial nerve deficit.     Coordination: Coordination normal.  Psychiatric:        Mood and Affect: Mood normal.  . LABORATORY DATA:  I have reviewed the data as listed Lab Results  Component Value Date   WBC 8.0 10/04/2021   HGB 8.5 (L) 10/04/2021   HCT 28.0 (L) 10/04/2021   MCV 78.2 (L) 10/04/2021   PLT 200 10/04/2021   Recent Labs    08/08/21 0821 09/05/21 0847 10/02/21 1017 10/03/21 0625 10/04/21 0615 10/05/21 0535  NA 139 138 140 144 142 141  K 4.3 3.9 3.8 3.5 3.7 3.7  CL 108 107 110 117* 114* 109  CO2 _0 21*  23 23  GLUCOSE 146* 112* 121* 100* 116* 106*  BUN 31* 21 32* 27* 27* 25*  CREATININE 1.54* 1.43* 1.66* 1.70* 1.51* 1.42*  CALCIUM 8.9 8.8* 9.0 8.1* 8.4* 8.6*  GFRNONAA 36* 39* 33* 32* 37* 39*  PROT 7.6 7.5 7.6  --   --   --   ALBUMIN 3.9 3.8 3.9  --   --   --   AST _1 --   --   --   ALT _2 --   --   --   ALKPHOS 112 86 89  --   --   --   BILITOT 0.5 0.5 0.7  --   --   --     Iron/TIBC/Ferritin/ %Sat    Component Value Date/Time   IRON 62 01/24/2021 0838   IRON 26 (L) 02/04/2019 1309   TIBC 315 01/24/2021 0838   TIBC 251 02/04/2019 1309   FERRITIN 239 01/24/2021 0838   FERRITIN 409 (H) 02/04/2019 1309   IRONPCTSAT 20 01/24/2021 0838   IRONPCTSAT 10 (L) 02/04/2019 1309    RADIOGRAPHIC STUDIES: I have personally reviewed the radiological images as listed and agreed with the findings in the report. DG Chest 2 View  Result Date: 08/21/2021 CLINICAL DATA:  Cough, shortness of breath EXAM: CHEST - 2 VIEW COMPARISON:  10/13/2018 FINDINGS: Cardiac size is within normal limits. There are no signs of pulmonary edema or focal pulmonary consolidation. There is no pleural effusion or pneumothorax. Tip of right IJ chest port is seen in the superior vena cava close to the right atrium. IMPRESSION: No active cardiopulmonary disease. Electronically Signed   By: Elmer Picker M.D.   On: 08/21/2021 17:04   CT Angio Chest PE W and/or Wo Contrast  Result Date: 10/02/2021 CLINICAL DATA:  Pulmonary embolism (PE) suspected, high prob EXAM: CT ANGIOGRAPHY  CHEST WITH CONTRAST TECHNIQUE: Multidetector CT imaging of the chest was performed using the standard protocol during bolus administration of intravenous contrast. Multiplanar CT image reconstructions and MIPs were obtained to evaluate the vascular anatomy. RADIATION DOSE REDUCTION: This exam was performed according to the departmental dose-optimization program which includes automated exposure control, adjustment of the mA and/or kV  according to patient size and/or use of iterative reconstruction technique. CONTRAST:  40m OMNIPAQUE IOHEXOL 350 MG/ML SOLN COMPARISON:  CT chest/abdomen/pelvis July 31, 2021. FINDINGS: Cardiovascular: No evidence of pulmonary embolism to the proximal segmental level. More distal evaluation is limited by respiratory motion. Normal heart size. No pericardial effusion. Atherosclerosis. Aberrant right subclavian artery passes behind the esophagus. Mediastinum/Nodes: Stable chronic anterior mediastinal mass which was non hypermetabolic on prior PET CT. Similar mediastinal and hilar lymph nodes which are not pathologically enlarged by CT size criteria. Thyroid gland, trachea, and esophagus demonstrate no significant findings. Lungs/Pleura: Unchanged 3 mm in the right upper lobe, stable over multiple priors. No consolidation. Similar areas of bronchial wall thickening. No pleural effusions or pneumothorax. Upper Abdomen: No acute abnormality. Musculoskeletal: No chest wall abnormality. No acute or significant osseous findings. Review of the MIP images confirms the above findings. IMPRESSION: 1. No evidence of pulmonary embolism to the proximal segmental level. More distal evaluation is limited by respiratory motion. 2. Similar emphysema (ICD10-J43.9) and diffuse bronchial wall thickening. Electronically Signed   By: FMargaretha SheffieldM.D.   On: 10/02/2021 12:25   UKoreaVenous Img Lower Unilateral Left  Result Date: 10/02/2021 CLINICAL DATA:  pain/hx of DVT EXAM: LEFT LOWER EXTREMITY VENOUS DOPPLER ULTRASOUND TECHNIQUE: Gray-scale sonography with compression, as well as color and duplex ultrasound, were performed to evaluate the deep venous system(s) from the level of the common femoral vein through the popliteal and proximal calf veins. COMPARISON:  None. FINDINGS: VENOUS Normal compressibility of the common femoral, superficial femoral, and popliteal veins, as well as the visualized calf veins. Visualized portions  of profunda femoral vein and great saphenous vein unremarkable. No filling defects to suggest DVT on grayscale or color Doppler imaging. Doppler waveforms show normal direction of venous flow, normal respiratory plasticity and response to augmentation. Limited views of the contralateral common femoral vein are unremarkable. IMPRESSION: No evidence of DVT in the left lower extremity. Electronically Signed   By: FMargaretha SheffieldM.D.   On: 10/02/2021 10:54      ASSESSMENT & PLAN:  1. [place]   2. Anemia in stage 3b chronic kidney disease (HCC)   3. Bladder tumor   4. Thyroid disorder screening    Cancer Staging  Bladder carcinoma ([facility] Staging form: Urinary Bladder, AJCC 8th Edition - Clinical stage from 08/01/2018: Stage IVA (cTX, cN3, cM1a) - Signed by YEarlie Server MD on 04/21/2019  #Metastatic urothelial carcinoma of bladder, sarcomatoid features pelvic sidewall mass biopsy showed metastatic carcinoma consistent with involvement by urothelial carcinoma., Stage IV NED- since 01/2020-patient was on Keytruda every 3 to 4 weeks for 2.5 years.  Shared decision was made to stop treatment and continued surveillance. Labs reviewed and discussed with Recent CT chest angiogram was reviewed Check CT abdomen and pelvis without contrast.   # Chronic kidney disease, stable creatinine today.  Avoid nephrotoxin. #Chronic anemia secondary to stage III CKD.  Multifactorial. Hemoglobin 8.5, likely secondary to acute infection.   I recommend patient to continue oral iron supplementation  Follow-up in 3 months.  .All questions were answered. The patient knows to call the clinic with  any problems questions or concerns.  [name], MD, PhD 10/31/21

## 2021-10-31 NOTE — Progress Notes (Signed)
Pt in for follow up and results today.  

## 2021-11-03 ENCOUNTER — Ambulatory Visit
Admission: RE | Admit: 2021-11-03 | Discharge: 2021-11-03 | Disposition: A | Discharge status: Home / Self Care | Payer: Medicare Other | Source: Ambulatory Visit | Attending: Oncology | Admitting: Oncology

## 2021-11-03 DIAGNOSIS — I700 Atherosclerosis of aorta: Secondary | ICD-10-CM | POA: Diagnosis not present

## 2021-11-03 DIAGNOSIS — N1330 Unspecified hydronephrosis: Secondary | ICD-10-CM | POA: Diagnosis not present

## 2021-11-03 DIAGNOSIS — N134 Hydroureter: Secondary | ICD-10-CM | POA: Diagnosis not present

## 2021-11-03 DIAGNOSIS — C679 Malignant neoplasm of bladder, unspecified: Secondary | ICD-10-CM | POA: Diagnosis not present

## 2021-11-03 DIAGNOSIS — D494 Neoplasm of unspecified behavior of bladder: Secondary | ICD-10-CM | POA: Diagnosis not present

## 2021-11-07 ENCOUNTER — Telehealth: Payer: Self-pay

## 2021-11-07 NOTE — Chronic Care Management (AMB) (Signed)
Chronic Care Management Pharmacy Assistant   Name: Angela Russell  MRN: [id number] DOB: 1950-09-20  Reason for Encounter: Disease State General   Recent office visits:  07/14/21-Angela Mathis Dad, NP (PCP) General follow up visit. Labs ordered. Flu vaccine given and pneumococcal vaccination. Follow up in 6 months.     Recent consult visits:  10/31/21-Angela Tasia Catchings, MD (Oncology) Follow up for bladder cancer, anemia. Follow up in 3 months. 09/05/21-Angela Tasia Catchings, MD (Oncology) Follow up for bladder cancer, anemia. 08/08/21-Angela Tasia Catchings, MD (Oncology) Follow up for bladder cancer, anemia.   08/07/21-Angela Vicente Males, MD (Gastroenterology) See for iron deficiency and anemia. Return if symptoms worsen or fail to improve. 07/11/21-Angela Tasia Catchings, MD (Oncology) Follow up for bladder cancer, anemia. 06/13/21-Angela Tasia Catchings, MD (Oncology) Follow up for bladder cancer, anemia. 06/02/21-Angela Russell [facility]) Notes not available.  05/16/21-Angela Tasia Catchings, MD (Oncology) Follow up for bladder cancer, anemia.  05/10/21-Angela Vicente Males, MD (Gastroenterology) Follow-up for iron deficiency anemia. Follow up in 3 months.   Hospital visits:  Medication Reconciliation was completed by comparing discharge summary, patient's EMR and Pharmacy list, and upon discussion with patient.  Admitted to the hospital on 10/02/21 due to Sepsis. Discharge date was 10/05/21. Discharged from [place]?Medications Started at [facility] Discharge:?? -started none noted  Medication Changes at Hospital Discharge: -Changed none noted  Medications Discontinued at Hospital Discharge: -Stopped none noted  Medications that remain the same after Hospital Discharge:??  -All other medications will remain the same.    Medications: Outpatient Encounter Medications as of 11/07/2021  Medication Sig   albuterol (VENTOLIN HFA) 108 (90 Base) MCG/ACT inhaler Inhale 2 puffs into the lungs every 4 (four) hours as needed.   amLODipine (NORVASC) 10 MG  tablet Take 1 tablet (10 mg total) by mouth daily.   bisacodyl (DULCOLAX) 5 MG EC tablet Take 1 tablet (5 mg total) by mouth daily as needed for moderate constipation.   docusate sodium (COLACE) 100 MG capsule Take 1 capsule (100 mg total) by mouth daily.   losartan (COZAAR) 25 MG tablet Take 1 tablet (25 mg total) by mouth daily.   Omega-3 Fatty Acids (FISH OIL PO) Take by mouth.   pembrolizumab (KEYTRUDA) 100 MG/4ML SOLN Inject 200 mg into the vein. Every 42 days,    Last txy 4/05, Next 5/03   polyethylene glycol (MIRALAX / GLYCOLAX) 17 g packet Take 17 g by mouth 2 (two) times daily.   promethazine-dextromethorphan (PROMETHAZINE-DM) 6.25-15 MG/5ML syrup Take 5 mLs by mouth 4 (four) times daily as needed. (Patient not taking: Reported on 10/31/2021)   Semaglutide (RYBELSUS) 3 MG TABS Take 1 tablet by mouth daily.   senna-docusate (SENOKOT-S) 8.6-50 MG tablet Take 1 tablet by mouth 2 (two) times daily.   Spacer/Aero-Holding Chambers (AEROCHAMBER MV) inhaler Use as instructed   [DISCONTINUED] prochlorperazine (COMPAZINE) 10 MG tablet Take 1 tablet (10 mg total) by mouth every 6 (six) hours as needed (Nausea or vomiting).   Facility-Administered Encounter Medications as of 11/07/2021  Medication   heparin lock flush 100 UNIT/ML injection   Current antihyperglycemic regimen:  Rybelsus 3 mg take 1 tab daily  Unsuccessful attempts to complete assessment call. I was unable to reach the patient nor be able to leave a voicemail to return the phone call back.   Adherence Review: Is the patient currently on a STATIN medication? No Is the patient currently on ACE/ARB medication? Yes Does the patient have >5 day gap between last estimated fill dates? No   Care Gaps:  Zoster Vaccines:Never done  Star Rating Drugs: Losartan 25 mg Last filed:10/30/21 90 DS  Angela Russell, [place]

## 2021-11-24 DIAGNOSIS — E119 Type 2 diabetes mellitus without complications: Secondary | ICD-10-CM | POA: Diagnosis not present

## 2021-11-24 DIAGNOSIS — H524 Presbyopia: Secondary | ICD-10-CM | POA: Diagnosis not present

## 2021-11-24 DIAGNOSIS — Z7984 Long term (current) use of oral hypoglycemic drugs: Secondary | ICD-10-CM | POA: Diagnosis not present

## 2021-11-24 DIAGNOSIS — H2513 Age-related nuclear cataract, bilateral: Secondary | ICD-10-CM | POA: Diagnosis not present

## 2021-11-24 DIAGNOSIS — H52223 Regular astigmatism, bilateral: Secondary | ICD-10-CM | POA: Diagnosis not present

## 2021-11-24 DIAGNOSIS — H5203 Hypermetropia, bilateral: Secondary | ICD-10-CM | POA: Diagnosis not present

## 2021-12-11 ENCOUNTER — Other Ambulatory Visit: Payer: Self-pay | Admitting: Nurse Practitioner

## 2021-12-11 NOTE — Telephone Encounter (Signed)
Medication Refill - Medication: amLODipine (NORVASC) 10 MG tablet   Has the patient contacted their pharmacy? Yes.   (Agent: If no, request that the patient contact the pharmacy for the refill. If patient does not wish to contact the pharmacy document the reason why and proceed with request.) (Agent: If yes, when and what did the pharmacy advise?)  Preferred Pharmacy (with phone number or street name): [place] ([address] [place] [address]  [place], [place] ([place]) [place] [id number]  Phone:  [phone]  Fax:  [phone]   Has the patient been seen for an appointment in the last year OR does the patient have an upcoming appointment? Yes.    Agent: Please be advised that RX refills may take up to 3 business days. We ask that you follow-up with your pharmacy.

## 2021-12-12 MED ORDER — AMLODIPINE BESYLATE 10 MG PO TABS: 10.0000 mg | ORAL_TABLET | Freq: Every day | ORAL | 0 refills | Status: DC

## 2021-12-12 NOTE — Telephone Encounter (Signed)
Requested Prescriptions  Pending Prescriptions Disp Refills  . amLODipine (NORVASC) 10 MG tablet 90 tablet 0    Sig: Take 1 tablet (10 mg total) by mouth daily.     Cardiovascular: Calcium Channel Blockers 2 Failed - 12/11/2021  2:54 PM      Failed - Last BP in normal range    BP Readings from Last 1 Encounters:  10/31/21 (!) 149/89         Passed - Last Heart Rate in normal range    Pulse Readings from Last 1 Encounters:  10/31/21 (!) 103         Passed - Valid encounter within last 6 months    Recent Outpatient Visits          5 months ago Hypertension associated with diabetes ([place])   [facility] [name], NP   11 months ago Hypertension associated with diabetes ([place])   [facility] [name], NP   1 year ago Hypertension associated with diabetes ([place])   [facility] [name], NP   1 year ago Controlled type 2 diabetes mellitus with complication, without long-term current use of insulin ([place])   [facility] [name], NP   1 year ago Hypertension associated with diabetes [facility])   [place] [name], NP      Future Appointments            In 1 month [name], NP [facility] [place]

## 2021-12-15 ENCOUNTER — Telehealth: Payer: Self-pay

## 2021-12-15 NOTE — Chronic Care Management (AMB) (Signed)
Chronic Care Management Pharmacy Assistant   Name: BRIYAH WHEELWRIGHT  MRN: [id number] DOB: April 13, 1951  Reason for Encounter: Disease State General   Recent office visits:  07/14/21-[name] Dad, NP (PCP) General follow up visit. Labs ordered. Flu vaccine and pneumococcal vaccination. Follow up in 6 months.  Recent consult visits:  10/31/21-[name], MD (Oncology) Follow up visit on bladder cancer and anemia. Follow up in 3 months. 09/05/21-[name], MD (Oncology) Follow up visit on bladder cancer and anemia. 08/08/21-[name], MD (Oncology) Follow up visit on bladder cancer and anemia. Follow up in 4 weeks. 08/07/21-[name], MD (Gastroenterology) Seen for iron deficiency anemia. Return if symptoms worsen or fail to improve. 07/11/21-[name], MD (Oncology) Follow up visit for bladder cancer and anemia. Follow up in 4 weeks.  Hospital visits:  Medication Reconciliation was completed by comparing discharge summary, patient's EMR and Pharmacy list, and upon discussion with patient.  Admitted to the hospital on 10/02/21 due to urinary tract infection. Discharge date was 10/05/21. Discharged from [place]?Medications Started at [facility] Discharge:?? -started none noted  Medication Changes at Hospital Discharge: -Changed none noted  Medications Discontinued at Hospital Discharge: -Stopped none noted  Medications that remain the same after Hospital Discharge:??  -All other medications will remain the same.    Medications: Outpatient Encounter Medications as of 12/15/2021  Medication Sig   albuterol (VENTOLIN HFA) 108 (90 Base) MCG/ACT inhaler Inhale 2 puffs into the lungs every 4 (four) hours as needed.   amLODipine (NORVASC) 10 MG tablet Take 1 tablet (10 mg total) by mouth daily.   bisacodyl (DULCOLAX) 5 MG EC tablet Take 1 tablet (5 mg total) by mouth daily as needed for moderate constipation.   docusate sodium (COLACE) 100 MG capsule Take 1 capsule  (100 mg total) by mouth daily.   losartan (COZAAR) 25 MG tablet Take 1 tablet (25 mg total) by mouth daily.   Omega-3 Fatty Acids (FISH OIL PO) Take by mouth.   pembrolizumab (KEYTRUDA) 100 MG/4ML SOLN Inject 200 mg into the vein. Every 42 days,    Last txy 4/05, Next 5/03   polyethylene glycol (MIRALAX / GLYCOLAX) 17 g packet Take 17 g by mouth 2 (two) times daily.   promethazine-dextromethorphan (PROMETHAZINE-DM) 6.25-15 MG/5ML syrup Take 5 mLs by mouth 4 (four) times daily as needed. (Patient not taking: Reported on 10/31/2021)   Semaglutide (RYBELSUS) 3 MG TABS Take 1 tablet by mouth daily.   senna-docusate (SENOKOT-S) 8.6-50 MG tablet Take 1 tablet by mouth 2 (two) times daily.   Spacer/Aero-Holding Chambers (AEROCHAMBER MV) inhaler Use as instructed   [DISCONTINUED] prochlorperazine (COMPAZINE) 10 MG tablet Take 1 tablet (10 mg total) by mouth every 6 (six) hours as needed (Nausea or vomiting).   Facility-Administered Encounter Medications as of 12/15/2021  Medication   heparin lock flush 100 UNIT/ML injection   Contacted [name] for General Review Call   Chart Review:  Have there been any documented new, changed, or discontinued medications since last visit? No (If yes, include name, dose, frequency, date) Has there been any documented recent hospitalizations or ED visits since last visit with Clinical Pharmacist? Yes   Adherence Review:  Does the Clinical Pharmacist Assistant have access to adherence rates? Yes  Does the patient have >5 day gap between last estimated fill dates for any of the above medications or other medication gaps? No   Unsuccessful attempts to complete assessment call. I have called patient 3x and left  3 voicemail's for the patient to return my call when available.   Care Gaps: Zoster Vaccines:Never done MAMMOGRAM:Never done DEXA SCAN:Never done FOOT EXAM:Last completed: Mar 11, 2020  Star Rating Drugs: Losartan 25 mg Last filled:12/13/21  90 DS  Myriam Elta [place], [place]

## 2021-12-19 ENCOUNTER — Ambulatory Visit (INDEPENDENT_AMBULATORY_CARE_PROVIDER_SITE_OTHER): Payer: Medicare Other | Admitting: *Deleted

## 2021-12-26 ENCOUNTER — Inpatient Hospital Stay: Payer: Medicare Other | Attending: Oncology

## 2021-12-26 DIAGNOSIS — C679 Malignant neoplasm of bladder, unspecified: Secondary | ICD-10-CM | POA: Diagnosis not present

## 2021-12-26 DIAGNOSIS — Z452 Encounter for adjustment and management of vascular access device: Secondary | ICD-10-CM | POA: Insufficient documentation

## 2021-12-26 DIAGNOSIS — Z95828 Presence of other vascular implants and grafts: Secondary | ICD-10-CM

## 2021-12-26 MED ORDER — SODIUM CHLORIDE 0.9% FLUSH
10.0000 mL | INTRAVENOUS | Status: DC | PRN
  Administered 2021-12-26: 10 mL via INTRAVENOUS
  Filled 2021-12-26: qty 10

## 2021-12-26 MED ORDER — HEPARIN SOD (PORK) LOCK FLUSH 100 UNIT/ML IV SOLN
500.0000 [IU] | Freq: Once | INTRAVENOUS | Status: AC
  Administered 2021-12-26: 500 [IU] via INTRAVENOUS
  Filled 2021-12-26: qty 5

## 2022-01-02 ENCOUNTER — Encounter: Payer: Self-pay | Admitting: Nurse Practitioner

## 2022-01-15 ENCOUNTER — Ambulatory Visit: Payer: Medicare Other | Admitting: Nurse Practitioner

## 2022-01-29 ENCOUNTER — Inpatient Hospital Stay: Payer: Medicare Other

## 2022-01-29 ENCOUNTER — Telehealth: Payer: Self-pay | Admitting: Oncology

## 2022-01-29 ENCOUNTER — Inpatient Hospital Stay: Payer: Medicare Other | Admitting: Oncology

## 2022-01-29 NOTE — Telephone Encounter (Signed)
Recieved [place] that pt wanted to cancel appt and be called to r/s. Tried reaching out to pt to get her R/s LVM

## 2022-01-30 ENCOUNTER — Ambulatory Visit: Payer: Medicare Other | Admitting: Nurse Practitioner

## 2022-01-31 ENCOUNTER — Other Ambulatory Visit: Payer: Medicare Other

## 2022-01-31 ENCOUNTER — Ambulatory Visit: Payer: Medicare Other | Admitting: Oncology

## 2022-02-07 ENCOUNTER — Telehealth: Payer: Self-pay

## 2022-02-07 NOTE — Progress Notes (Signed)
Chronic Care Management Pharmacy Assistant   Name: Angela Russell  MRN: [id number] DOB: [birth year]   Reason for Encounter: Disease State   Conditions to be addressed/monitored: DMII  Recent office visits:  None since the last coordination call  Recent consult visits:  None since the last coordination call  Hospital visits:  Medication Reconciliation was completed by comparing discharge summary, patient's EMR and Pharmacy list, and upon discussion with patient.  Admitted to the hospital on 10/02/21 due to UTI. Discharge date was 10/05/21. Discharged from [facility].   Admitted to the hospital on 08/21/21 due to cough, sob. Discharge date was 08/21/21. Discharged from [facility].   Medications Discontinued at Hospital Discharge: -Stopped  Benzonatate 100 mg capsule Doxycycline 100 mg capsule Lidocaine-prilocaine cream  Medications that remain the same after Hospital Discharge:??  -All other medications will remain the same.    Admitted to the hospital on 08/21/21 due to cough, sob. Discharge date was 08/21/21. Discharged from [facility]  Medications that remain the same after Hospital Discharge:??  -All other medications will remain the same.   Medications: Outpatient Encounter Medications as of 02/07/2022  Medication Sig   albuterol (VENTOLIN HFA) 108 (90 Base) MCG/ACT inhaler Inhale 2 puffs into the lungs every 4 (four) hours as needed.   amLODipine (NORVASC) 10 MG tablet Take 1 tablet (10 mg total) by mouth daily.   bisacodyl (DULCOLAX) 5 MG EC tablet Take 1 tablet (5 mg total) by mouth daily as needed for moderate constipation.   docusate sodium (COLACE) 100 MG capsule Take 1 capsule (100 mg total) by mouth daily.   losartan (COZAAR) 25 MG tablet Take 1 tablet (25 mg total) by mouth daily.   Omega-3 Fatty Acids (FISH OIL PO) Take by mouth.   pembrolizumab (KEYTRUDA) 100 MG/4ML SOLN Inject 200 mg into the  vein. Every 42 days,    Last txy 4/05, Next 5/03   polyethylene glycol (MIRALAX / GLYCOLAX) 17 g packet Take 17 g by mouth 2 (two) times daily.   promethazine-dextromethorphan (PROMETHAZINE-DM) 6.25-15 MG/5ML syrup Take 5 mLs by mouth 4 (four) times daily as needed. (Patient not taking: Reported on 10/31/2021)   Semaglutide (RYBELSUS) 3 MG TABS Take 1 tablet by mouth daily.   senna-docusate (SENOKOT-S) 8.6-50 MG tablet Take 1 tablet by mouth 2 (two) times daily.   Spacer/Aero-Holding Chambers (AEROCHAMBER MV) inhaler Use as instructed   [DISCONTINUED] prochlorperazine (COMPAZINE) 10 MG tablet Take 1 tablet (10 mg total) by mouth every 6 (six) hours as needed (Nausea or vomiting).   Facility-Administered Encounter Medications as of 02/07/2022  Medication   heparin lock flush 100 UNIT/ML injection   Recent Relevant Labs: Lab Results  Component Value Date/Time   HGBA1C 6.8 (H) 07/14/2021 08:18 AM   HGBA1C 6.9 (H) 01/06/2021 08:29 AM   HGBA1C 7.2 (H) 12/06/2017 04:14 PM   HGBA1C 7.5 (H) 09/06/2017 03:30 PM   HGBA1C per care everywhere 10/15/2016 12:00 AM   MICROALBUR 150 (H) 03/11/2020 04:44 PM   MICROALBUR per care everywhere 07/16/2016 12:00 AM    Kidney Function Lab Results  Component Value Date/Time   CREATININE 1.42 (H) 10/05/2021 05:35 AM   CREATININE 1.51 (H) 10/04/2021 06:15 AM   GFRNONAA 39 (L) 10/05/2021 05:35 AM   GFRAA 27 (L) 03/01/2020 01:01 PM   3 attempts were made to reach patient for diabetic  assessment. LVM for patient to return call.  Current antihyperglycemic regimen:  Rybelsus 3  mg 1 tab daily  What recent interventions/DTPs have been made to improve glycemic control:  None   Adherence Review: Is the patient currently on a STATIN medication? No Is the patient currently on ACE/ARB medication? Yes Does the patient have >5 day gap between last estimated fill dates? No     Care Gaps: Colonoscopy-03/17/21 Diabetic Foot Exam-03/11/20 Mammogram-Never  done Ophthalmology-06/02/21 Dexa Scan - Never done Annual Well Visit - NA Micro albumin-NA Hemoglobin A1c- 07/14/21  Star Rating Drugs: Losartan 25 mg- last filled:12/13/21 90 DS, 10/30/21 90 ds  Ethelene Hal Clinical Pharmacist Assistant [phone]

## 2022-02-20 ENCOUNTER — Inpatient Hospital Stay: Payer: Medicare Other | Attending: Oncology

## 2022-02-20 DIAGNOSIS — C679 Malignant neoplasm of bladder, unspecified: Secondary | ICD-10-CM | POA: Insufficient documentation

## 2022-02-20 DIAGNOSIS — N1832 Chronic kidney disease, stage 3b: Secondary | ICD-10-CM | POA: Insufficient documentation

## 2022-02-20 DIAGNOSIS — Z1329 Encounter for screening for other suspected endocrine disorder: Secondary | ICD-10-CM

## 2022-02-20 DIAGNOSIS — Z79899 Other long term (current) drug therapy: Secondary | ICD-10-CM | POA: Diagnosis not present

## 2022-02-20 DIAGNOSIS — C7910 Secondary malignant neoplasm of unspecified urinary organs: Secondary | ICD-10-CM

## 2022-02-20 LAB — CBC WITH DIFFERENTIAL/PLATELET
Abs Immature Granulocytes: 0.03 10*3/uL (ref 0.00–0.07)
Basophils Absolute: 0 10*3/uL (ref 0.0–0.1)
Basophils Relative: 0 %
Eosinophils Absolute: 0.2 10*3/uL (ref 0.0–0.5)
Eosinophils Relative: 4 %
HCT: 32.8 % — ABNORMAL LOW (ref 36.0–46.0)
Hemoglobin: 10 g/dL — ABNORMAL LOW (ref 12.0–15.0)
Immature Granulocytes: 1 %
Lymphocytes Relative: 26 %
Lymphs Abs: 1.3 10*3/uL (ref 0.7–4.0)
MCH: 23.9 pg — ABNORMAL LOW (ref 26.0–34.0)
MCHC: 30.5 g/dL (ref 30.0–36.0)
MCV: 78.3 fL — ABNORMAL LOW (ref 80.0–100.0)
Monocytes Absolute: 0.4 10*3/uL (ref 0.1–1.0)
Monocytes Relative: 7 %
Neutro Abs: 3.2 10*3/uL (ref 1.7–7.7)
Neutrophils Relative %: 62 %
Platelets: 263 10*3/uL (ref 150–400)
RBC: 4.19 MIL/uL (ref 3.87–5.11)
RDW: 14 % (ref 11.5–15.5)
WBC: 5.1 10*3/uL (ref 4.0–10.5)
nRBC: 0 % (ref 0.0–0.2)

## 2022-02-20 LAB — T4, FREE: Free T4: 0.94 ng/dL (ref 0.61–1.12)

## 2022-02-20 LAB — RETIC PANEL
Immature Retic Fract: 9.3 % (ref 2.3–15.9)
RBC.: 4.11 MIL/uL (ref 3.87–5.11)
Retic Count, Absolute: 63.3 10*3/uL (ref 19.0–186.0)
Retic Ct Pct: 1.5 % (ref 0.4–3.1)
Reticulocyte Hemoglobin: 27.4 pg — ABNORMAL LOW (ref >27.9)

## 2022-02-20 LAB — COMPREHENSIVE METABOLIC PANEL
ALT: 11 U/L (ref 0–44)
AST: 23 U/L (ref 15–41)
Albumin: 4 g/dL (ref 3.5–5.0)
Alkaline Phosphatase: 95 U/L (ref 38–126)
Anion gap: 7 (ref 5–15)
BUN: 32 mg/dL — ABNORMAL HIGH (ref 8–23)
CO2: 23 mmol/L (ref 22–32)
Calcium: 9.1 mg/dL (ref 8.9–10.3)
Chloride: 109 mmol/L (ref 98–111)
Creatinine, Ser: 1.72 mg/dL — ABNORMAL HIGH (ref 0.44–1.00)
GFR, Estimated: 31 mL/min — ABNORMAL LOW (ref >60)
Glucose, Bld: 119 mg/dL — ABNORMAL HIGH (ref 70–99)
Potassium: 4.3 mmol/L (ref 3.5–5.1)
Sodium: 139 mmol/L (ref 135–145)
Total Bilirubin: 0.5 mg/dL (ref 0.3–1.2)
Total Protein: 8.1 g/dL (ref 6.5–8.1)

## 2022-02-20 LAB — FERRITIN: Ferritin: 175 ng/mL (ref 11–307)

## 2022-02-20 LAB — LACTATE DEHYDROGENASE: LDH: 161 U/L (ref 98–192)

## 2022-02-20 LAB — IRON AND TIBC
Iron: 77 ug/dL (ref 28–170)
Saturation Ratios: 24 % (ref 10.4–31.8)
TIBC: 326 ug/dL (ref 250–450)
UIBC: 249 ug/dL

## 2022-02-20 LAB — TSH: TSH: 1.655 u[IU]/mL (ref 0.350–4.500)

## 2022-02-20 MED ORDER — HEPARIN SOD (PORK) LOCK FLUSH 100 UNIT/ML IV SOLN
500.0000 [IU] | Freq: Once | INTRAVENOUS | Status: AC
  Administered 2022-02-20: 500 [IU] via INTRAVENOUS
  Filled 2022-02-20: qty 5

## 2022-02-20 MED ORDER — SODIUM CHLORIDE 0.9% FLUSH
10.0000 mL | Freq: Once | INTRAVENOUS | Status: AC
  Administered 2022-02-20: 10 mL via INTRAVENOUS
  Filled 2022-02-20: qty 10

## 2022-02-21 ENCOUNTER — Telehealth: Payer: Self-pay | Admitting: Oncology

## 2022-02-21 NOTE — Telephone Encounter (Signed)
Patient called and requested to cancel her appointment with Dr. Tasia Catchings on Friday 9/22. This appointment has already been moved a few times. Patient stated she cannot get off work and would prefer to just see Dr. Tasia Catchings when she is due for her next portflush on 11/14.   Routing to clinical team to make aware.

## 2022-02-21 NOTE — Telephone Encounter (Signed)
Ok. Thanks!

## 2022-02-22 ENCOUNTER — Encounter: Payer: Self-pay | Admitting: Nurse Practitioner

## 2022-02-22 ENCOUNTER — Ambulatory Visit (INDEPENDENT_AMBULATORY_CARE_PROVIDER_SITE_OTHER): Payer: Medicare Other | Admitting: Nurse Practitioner

## 2022-02-22 VITALS — BP 126/80 | HR 84 | Temp 98.3°F | Ht 62.99 in | Wt 214.9 lb

## 2022-02-22 DIAGNOSIS — I129 Hypertensive chronic kidney disease with stage 1 through stage 4 chronic kidney disease, or unspecified chronic kidney disease: Secondary | ICD-10-CM

## 2022-02-22 DIAGNOSIS — N184 Chronic kidney disease, stage 4 (severe): Secondary | ICD-10-CM

## 2022-02-22 DIAGNOSIS — E118 Type 2 diabetes mellitus with unspecified complications: Secondary | ICD-10-CM | POA: Diagnosis not present

## 2022-02-22 DIAGNOSIS — Z1231 Encounter for screening mammogram for malignant neoplasm of breast: Secondary | ICD-10-CM | POA: Diagnosis not present

## 2022-02-22 DIAGNOSIS — D631 Anemia in chronic kidney disease: Secondary | ICD-10-CM

## 2022-02-22 DIAGNOSIS — Z23 Encounter for immunization: Secondary | ICD-10-CM | POA: Diagnosis not present

## 2022-02-22 DIAGNOSIS — Z7189 Other specified counseling: Secondary | ICD-10-CM

## 2022-02-22 DIAGNOSIS — E1122 Type 2 diabetes mellitus with diabetic chronic kidney disease: Secondary | ICD-10-CM

## 2022-02-22 DIAGNOSIS — E782 Mixed hyperlipidemia: Secondary | ICD-10-CM | POA: Diagnosis not present

## 2022-02-22 DIAGNOSIS — Z0000 Encounter for general adult medical examination without abnormal findings: Secondary | ICD-10-CM | POA: Diagnosis not present

## 2022-02-22 DIAGNOSIS — Z780 Asymptomatic menopausal state: Secondary | ICD-10-CM

## 2022-02-22 DIAGNOSIS — N1832 Chronic kidney disease, stage 3b: Secondary | ICD-10-CM

## 2022-02-22 DIAGNOSIS — I10 Essential (primary) hypertension: Secondary | ICD-10-CM | POA: Diagnosis not present

## 2022-02-22 LAB — MICROALBUMIN / CREATININE URINE RATIO
Creatinine, Urine: 50 mg/dL
Microalb/Creat Ratio: >300 mg/g creat — ABNORMAL HIGH (ref 0–29)
Microalbumin, Urine: 150 ug/mL

## 2022-02-22 MED ORDER — LOSARTAN POTASSIUM 25 MG PO TABS
25.0000 mg | ORAL_TABLET | Freq: Every day | ORAL | 1 refills | Status: DC
Start: 2022-02-22 — End: 2022-08-24

## 2022-02-22 MED ORDER — RYBELSUS 3 MG PO TABS
1.0000 | ORAL_TABLET | Freq: Every day | ORAL | 1 refills | Status: DC
Start: 2022-02-22 — End: 2022-08-24

## 2022-02-22 MED ORDER — AMLODIPINE BESYLATE 10 MG PO TABS: 10.0000 mg | ORAL_TABLET | Freq: Every day | ORAL | 0 refills | Status: DC

## 2022-02-22 NOTE — Assessment & Plan Note (Signed)
Chronic.  Controlled.  Last A1c 6.8.  Continue with current medication regimen of Rybelsus '3mg'$ .  Refill sent today.  Microalbumin and Foot exam done today.  Labs ordered today.  Return to clinic in 6 months for reevaluation.  Call sooner if concerns arise.

## 2022-02-22 NOTE — Progress Notes (Signed)
BP 126/80   Pulse 84   Temp 98.3 F (36.8 C) (Oral)   Ht 5' 2.99" (1.6 m)   Wt 214 lb 14.4 oz (97.5 kg)   SpO2 100%   BMI 38.08 kg/m    Subjective:    Patient ID: Angela Russell, female    DOB: 04-06-1951, 71 y.o.   MRN: [id number]  HPI: Angela Russell is a 71 y.o. female presenting on 02/22/2022 for comprehensive medical examination. Current medical complaints include:none  She currently lives with: Menopausal Symptoms: no  HYPERTENSION Hypertension status: controlled  Satisfied with current treatment? yes Duration of hypertension: years BP monitoring frequency:  rarely BP range: 120-130-70/80 BP medication side effects:  no Medication compliance: excellent compliance Previous BP meds:amlodipine and losartan (cozaar) Aspirin: no Recurrent headaches: no Visual changes: no Palpitations: no Dyspnea: no Chest pain: no Lower extremity edema: no Dizzy/lightheaded: no    DIABETES Hypoglycemic episodes:no Polydipsia/polyuria: no Visual disturbance: no Chest pain: no Paresthesias: no Glucose Monitoring: no             Accucheck frequency: Daily             Fasting glucose: <120             Post prandial:             Evening:             Before meals: Taking Insulin?: no             Long acting insulin:             Short acting insulin: Blood Pressure Monitoring: rarely Retinal Examination: Up to Date Foot Exam: Up to Date Diabetic Education: Not Completed Pneumovax: Up to Date Influenza: Up to Date Aspirin: yes   CHRONIC KIDNEY DISEASE CKD status: controlled Medications renally dose: yes Previous renal evaluation: yes Pneumovax:  Up to Date Influenza Vaccine:  Up to Date   ANEMIA Anemia status: stable Etiology of anemia:CKD Duration of anemia treatment:  Compliance with treatment: excellent compliance Iron supplementation side effects: no Severity of anemia: moderate Fatigue: yes Decreased exercise tolerance: no  Dyspnea on exertion:  no Palpitations: no Bleeding: no Pica: no  Functional Status Survey:       02/22/2022    8:51 AM 07/14/2021    8:09 AM 06/21/2020   11:00 AM 07/14/2019   10:30 AM 06/23/2019   11:00 AM  Fall Risk   Falls in the past year? 0 0 0 0 0  Number falls in past yr: 0 0     Injury with Fall? 0 0     Risk for fall due to :  No Fall Risks     Follow up  Falls evaluation completed       Depression Screen    02/22/2022    8:51 AM 07/14/2021    8:10 AM 09/15/2020    3:39 PM 06/22/2019    3:08 PM 09/06/2017    3:08 PM  Depression screen PHQ 2/9  Decreased Interest 0 0 0 0 1  Down, Depressed, Hopeless 0 0 0 0 0  PHQ - 2 Score 0 0 0 0 1  Altered sleeping 0 0     Tired, decreased energy 0 0     Change in appetite 0 0     Feeling bad or failure about yourself  0 0     Trouble concentrating 0 0     Moving slowly or fidgety/restless 0 0  Suicidal thoughts 0 0     PHQ-9 Score 0 0     Difficult doing work/chores Not difficult at all Not difficult at all        Advanced Directives Does patient have a HCPOA?    no If yes, name and contact information:  Does patient have a living will or MOST form?  no  Past Medical History:  Past Medical History:  Diagnosis Date   Carotid artery plaque, right 01/2014   CKD (chronic kidney disease)    stage 2-3   Controlled diabetes mellitus type 2 with complications ([place])    Diabetes mellitus without complication (HCC)    DM (diabetes mellitus), type 2, uncontrolled    Hyperlipidemia    Hypertension    Hypochromic microcytic anemia    mild   Metastatic urothelial carcinoma ([place]) 03/23/2019   Osteoporosis    Personal history of colonic polyps    Renal insufficiency    Rotator cuff tendonitis, right     Surgical History:  Past Surgical History:  Procedure Laterality Date   COLONOSCOPY WITH PROPOFOL N/A 03/17/2021   Procedure: COLONOSCOPY WITH PROPOFOL;  Surgeon: Jonathon Bellows, MD;  Location[facility] ENDOSCOPY;  Service: Gastroenterology;   Laterality: N/A;   CYSTOSCOPY W/ RETROGRADES Bilateral 07/16/2018   Procedure: CYSTOSCOPY WITH RETROGRADE PYELOGRAM;  Surgeon: Billey Co, MD;  Location: [facility] ORS;  Service: Urology;  Laterality: Bilateral;   ESOPHAGOGASTRODUODENOSCOPY  03/17/2021   Procedure: ESOPHAGOGASTRODUODENOSCOPY (EGD);  Surgeon: Jonathon Bellows, MD;  Location: [facility] ENDOSCOPY;  Service: Gastroenterology;;   GIVENS CAPSULE STUDY N/A 06/19/2021   Procedure: GIVENS CAPSULE STUDY;  Surgeon: Jonathon Bellows, MD;  Location: [facility] ENDOSCOPY;  Service: Gastroenterology;  Laterality: N/A;   IVC FILTER INSERTION N/A 03/30/2019   Procedure: IVC FILTER INSERTION;  Surgeon: Algernon Huxley, MD;  Location: [place] CV LAB;  Service: Cardiovascular;  Laterality: N/A;   IVC FILTER REMOVAL N/A 06/15/2019   Procedure: IVC FILTER REMOVAL;  Surgeon: Algernon Huxley, MD;  Location: [place] CV LAB;  Service: Cardiovascular;  Laterality: N/A;   KNEE SURGERY Left 03/17/2013   torn meniscus   PERIPHERAL VASCULAR THROMBECTOMY Right 03/30/2019   Procedure: PERIPHERAL VASCULAR THROMBECTOMY;  Surgeon: Algernon Huxley, MD;  Location: [place] CV LAB;  Service: Cardiovascular;  Laterality: Right;   PORTA CATH INSERTION N/A 08/06/2018   Procedure: PORTA CATH INSERTION;  Surgeon: Algernon Huxley, MD;  Location: [place] CV LAB;  Service: Cardiovascular;  Laterality: N/A;   TOTAL ABDOMINAL HYSTERECTOMY  2000   due to bleeding and fibroids, partial- still has ovaries   TRANSURETHRAL RESECTION OF BLADDER TUMOR N/A 07/16/2018   Procedure: TRANSURETHRAL RESECTION OF BLADDER TUMOR (TURBT);  Surgeon: Billey Co, MD;  Location: [facility] ORS;  Service: Urology;  Laterality: N/A;    Medications:  Current Outpatient Medications on File Prior to Visit  Medication Sig   bisacodyl (DULCOLAX) 5 MG EC tablet Take 1 tablet (5 mg total) by mouth daily as needed for moderate constipation.   docusate sodium (COLACE) 100 MG capsule Take 1 capsule (100 mg total)  by mouth daily.   Omega-3 Fatty Acids (FISH OIL PO) Take by mouth.   polyethylene glycol (MIRALAX / GLYCOLAX) 17 g packet Take 17 g by mouth 2 (two) times daily.   senna-docusate (SENOKOT-S) 8.6-50 MG tablet Take 1 tablet by mouth 2 (two) times daily.   Spacer/Aero-Holding Chambers (AEROCHAMBER MV) inhaler Use as instructed   albuterol (VENTOLIN HFA) 108 (90 Base) MCG/ACT inhaler Inhale 2  puffs into the lungs every 4 (four) hours as needed. (Patient not taking: Reported on 02/22/2022)   [DISCONTINUED] prochlorperazine (COMPAZINE) 10 MG tablet Take 1 tablet (10 mg total) by mouth every 6 (six) hours as needed (Nausea or vomiting).   Current Facility-Administered Medications on File Prior to Visit  Medication   heparin lock flush 100 UNIT/ML injection    Allergies:  No Known Allergies  Social History:  Social History   Socioeconomic History   Marital status: Single    Spouse name: Not on file   Number of children: 1   Years of education: Not on file   Highest education level: 10th grade  Occupational History   Not on file  Tobacco Use   Smoking status: Former    Types: Cigarettes    Quit date: 06/05/1991    Years since quitting: 30.7   Smokeless tobacco: Never  Vaping Use   Vaping Use: Never used  Substance and Sexual Activity   Alcohol use: No   Drug use: No   Sexual activity: Not Currently  Other Topics Concern   Not on file  Social History Narrative   Working full time   Social Determinants of Health   Financial Resource Strain: Low Risk  (10/02/2020)   Overall Financial Resource Strain (CARDIA)    Difficulty of Paying Living Expenses: Not hard at all  Food Insecurity: No Food Insecurity (09/06/2017)   Hunger Vital Sign    Worried About Running Out of Food in the Last Year: Never true    [place] in the Last Year: Never true  Transportation Needs: No Transportation Needs (09/06/2017)   PRAPARE - Hydrologist (Medical): No    Lack of  Transportation (Non-Medical): No  Physical Activity: Insufficiently Active (09/06/2017)   Exercise Vital Sign    Days of Exercise per Week: 2 days    Minutes of Exercise per Session: 30 min  Stress: No Stress Concern Present (09/06/2017)   [place]    Feeling of Stress : Not at all  Social Connections: Moderately Integrated (09/06/2017)   Social Connection and Isolation Panel [NHANES]    Frequency of Communication with Friends and Family: More than three times a week    Frequency of Social Gatherings with Friends and Family: More than three times a week    Attends Religious Services: More than 4 times per year    Active Member of [employer] or Organizations: Yes    Attends Archivist Meetings: More than 4 times per year    Marital Status: Separated  Intimate Partner Violence: Not At Risk (09/06/2017)   Humiliation, Afraid, Rape, and Kick questionnaire    Fear of Current or Ex-Partner: No    Emotionally Abused: No    Physically Abused: No    Sexually Abused: No   Social History   Tobacco Use  Smoking Status Former   Types: Cigarettes   Quit date: 06/05/1991   Years since quitting: 30.7  Smokeless Tobacco Never   Social History   Substance and Sexual Activity  Alcohol Use No    Family History:  Family History  Problem Relation Age of Onset   Heart disease Mother    Heart attack Mother    Arthritis Father    Diabetes Brother     Past medical history, surgical history, medications, allergies, family history and social history reviewed with patient today and changes made to appropriate areas  of the chart.   Review of Systems  HENT:         Denies vision changes.  Eyes:  Negative for blurred vision and double vision.  Respiratory:  Negative for shortness of breath.   Cardiovascular:  Negative for chest pain, palpitations and leg swelling.  Neurological:  Negative for dizziness, tingling and headaches.   Endo/Heme/Allergies:  Negative for polydipsia.       Denies Polyuria    All other ROS negative except what is listed above and in the HPI.      Objective:    BP 126/80   Pulse 84   Temp 98.3 F (36.8 C) (Oral)   Ht 5' 2.99" (1.6 m)   Wt 214 lb 14.4 oz (97.5 kg)   SpO2 100%   BMI 38.08 kg/m   Wt Readings from Last 3 Encounters:  02/22/22 214 lb 14.4 oz (97.5 kg)  10/31/21 210 lb (95.3 kg)  10/03/21 210 lb 8.6 oz (95.5 kg)    No results found.  Physical Exam Vitals and nursing note reviewed.  Constitutional:      General: She is awake. She is not in acute distress.    Appearance: Normal appearance. She is well-developed and normal weight. She is not ill-appearing.  HENT:     Head: Normocephalic and atraumatic.     Right Ear: Hearing, tympanic membrane, ear canal and external ear normal. No drainage.     Left Ear: Hearing, tympanic membrane, ear canal and external ear normal. No drainage.     Nose: Nose normal.     Right Sinus: No maxillary sinus tenderness or frontal sinus tenderness.     Left Sinus: No maxillary sinus tenderness or frontal sinus tenderness.     Mouth/Throat:     Mouth: Mucous membranes are moist.     Pharynx: Oropharynx is clear. Uvula midline. No pharyngeal swelling, oropharyngeal exudate or posterior oropharyngeal erythema.  Eyes:     General: Lids are normal.        Right eye: No discharge.        Left eye: No discharge.     Extraocular Movements: Extraocular movements intact.     Conjunctiva/sclera: Conjunctivae normal.     Pupils: Pupils are equal, round, and reactive to light.     Visual Fields: Right eye visual fields normal and left eye visual fields normal.  Neck:     Thyroid: No thyromegaly.     Vascular: No carotid bruit.     Trachea: Trachea normal.  Cardiovascular:     Rate and Rhythm: Normal rate and regular rhythm.     Heart sounds: Normal heart sounds. No murmur heard.    No gallop.  Pulmonary:     Effort: Pulmonary effort is  normal. No accessory muscle usage or respiratory distress.     Breath sounds: Normal breath sounds.  Chest:  Breasts:    Right: Normal.     Left: Normal.  Abdominal:     General: Bowel sounds are normal.     Palpations: Abdomen is soft. There is no hepatomegaly or splenomegaly.     Tenderness: There is no abdominal tenderness.  Musculoskeletal:        General: Normal range of motion.     Cervical back: Normal range of motion and neck supple.     Right lower leg: No edema.     Left lower leg: No edema.  Lymphadenopathy:     Head:     Right side of head: No  submental, submandibular, tonsillar, preauricular or posterior auricular adenopathy.     Left side of head: No submental, submandibular, tonsillar, preauricular or posterior auricular adenopathy.     Cervical: No cervical adenopathy.     Upper Body:     Right upper body: No supraclavicular, axillary or pectoral adenopathy.     Left upper body: No supraclavicular, axillary or pectoral adenopathy.  Skin:    General: Skin is warm and dry.     Capillary Refill: Capillary refill takes less than 2 seconds.     Findings: No rash.  Neurological:     Mental Status: She is alert and oriented to person, place, and time.     Gait: Gait is intact.     Deep Tendon Reflexes: Reflexes are normal and symmetric.     Reflex Scores:      Brachioradialis reflexes are 2+ on the right side and 2+ on the left side.      Patellar reflexes are 2+ on the right side and 2+ on the left side. Psychiatric:        Attention and Perception: Attention normal.        Mood and Affect: Mood normal.        Speech: Speech normal.        Behavior: Behavior normal. Behavior is cooperative.        Thought Content: Thought content normal.        Judgment: Judgment normal.        06/22/2019    3:15 PM 09/06/2017    3:09 PM  6CIT Screen  What Year? 0 points 0 points  What month? 0 points 0 points  What time? 0 points 0 points  Count back from 20 0 points 0  points  Months in reverse 0 points 0 points  Repeat phrase 2 points 0 points  Total Score 2 points 0 points    Cognitive Testing - 6-CIT  Correct? Score   What year is it? yes 0 Yes = 0    No = 4  What month is it? yes 0 Yes = 0    No = 3  Remember:     [address], [place], [place]     What time is it? yes 0 Yes = 0    No = 3  Count backwards from 20 to 1 yes 0 Correct = 0    1 error = 2   More than 1 error = 4  Say the months of the year in reverse. yes 0 Correct = 0    1 error = 2   More than 1 error = 4  What address did I ask you to remember? yes 0 Correct = 0  1 error = 2    2 error = 4    3 error = 6    4 error = 8    All wrong = 10       TOTAL SCORE  0/28   Interpretation:  Normal  Normal (0-7) Abnormal (8-28)   Results for orders placed or performed in visit on 02/20/22  TSH  Result Value Ref Range   TSH 1.655 0.350 - 4.500 uIU/mL  T4, free  Result Value Ref Range   Free T4 0.94 0.61 - 1.12 ng/dL  Iron and TIBC  Result Value Ref Range   Iron 77 28 - 170 ug/dL   TIBC 326 250 - 450 ug/dL   Saturation Ratios 24 10.4 - 31.8 %  UIBC 249 ug/dL  Ferritin  Result Value Ref Range   Ferritin 175 11 - 307 ng/mL  Retic Panel  Result Value Ref Range   Retic Ct Pct 1.5 0.4 - 3.1 %   RBC. 4.11 3.87 - 5.11 MIL/uL   Retic Count, Absolute 63.3 19.0 - 186.0 K/uL   Immature Retic Fract 9.3 2.3 - 15.9 %   Reticulocyte Hemoglobin 27.4 (L) >27.9 pg  Lactate dehydrogenase  Result Value Ref Range   LDH 161 98 - 192 U/L  Comprehensive metabolic panel  Result Value Ref Range   Sodium 139 135 - 145 mmol/L   Potassium 4.3 3.5 - 5.1 mmol/L   Chloride 109 98 - 111 mmol/L   CO2 23 22 - 32 mmol/L   Glucose, Bld 119 (H) 70 - 99 mg/dL   BUN 32 (H) 8 - 23 mg/dL   Creatinine, Ser 1.72 (H) 0.44 - 1.00 mg/dL   Calcium 9.1 8.9 - 10.3 mg/dL   Total Protein 8.1 6.5 - 8.1 g/dL   Albumin 4.0 3.5 - 5.0 g/dL   AST 23 15 - 41 U/L   ALT 11 0 - 44 U/L   Alkaline Phosphatase 95 38 - 126  U/L   Total Bilirubin 0.5 0.3 - 1.2 mg/dL   GFR, Estimated 31 (L) >60 mL/min   Anion gap 7 5 - 15  CBC with Differential/Platelet  Result Value Ref Range   WBC 5.1 4.0 - 10.5 K/uL   RBC 4.19 3.87 - 5.11 MIL/uL   Hemoglobin 10.0 (L) 12.0 - 15.0 g/dL   HCT 32.8 (L) 36.0 - 46.0 %   MCV 78.3 (L) 80.0 - 100.0 fL   MCH 23.9 (L) 26.0 - 34.0 pg   MCHC 30.5 30.0 - 36.0 g/dL   RDW 14.0 11.5 - 15.5 %   Platelets 263 150 - 400 K/uL   nRBC 0.0 0.0 - 0.2 %   Neutrophils Relative % 62 %   Neutro Abs 3.2 1.7 - 7.7 K/uL   Lymphocytes Relative 26 %   Lymphs Abs 1.3 0.7 - 4.0 K/uL   Monocytes Relative 7 %   Monocytes Absolute 0.4 0.1 - 1.0 K/uL   Eosinophils Relative 4 %   Eosinophils Absolute 0.2 0.0 - 0.5 K/uL   Basophils Relative 0 %   Basophils Absolute 0.0 0.0 - 0.1 K/uL   Immature Granulocytes 1 %   Abs Immature Granulocytes 0.03 0.00 - 0.07 K/uL      Assessment & Plan:   Problem List Items Addressed This Visit       Cardiovascular and Mediastinum   Hypertension    Chronic.  Controlled.  Continue with current medication regimen of Amlodipine 48m daily and Losartan 222mdaily.  Labs ordered today.  Refill sent today. Return to clinic in 6 months for reevaluation.  Call sooner if concerns arise.        Relevant Medications   amLODipine (NORVASC) 10 MG tablet   losartan (COZAAR) 25 MG tablet   Other Relevant Orders   Comp Met (CMET)     Endocrine   Controlled diabetes mellitus type 2 with complications (HCC)    Chronic.  Controlled.  Last A1c 6.8.  Continue with current medication regimen of Rybelsus 19m52m Refill sent today.  Microalbumin and Foot exam done today.  Labs ordered today.  Return to clinic in 6 months for reevaluation.  Call sooner if concerns arise.        Relevant Medications   losartan (COZAAR) 25  MG tablet   Semaglutide (RYBELSUS) 3 MG TABS   Other Relevant Orders   HgB A1c   Urine Microalbumin w/creat. ratio     Genitourinary   CKD (chronic kidney  disease)    Chronic. Labs ordered today.  Followed by Nephrology.  Will make recommendations based on lab results. Follow up in 6 months.  Call sooner if concerns arise.      Anemia in stage 4 chronic kidney disease ([place])    Labs ordered today. Will make recommendations based on lab results. Continue to follow up with Nephrology.  Follow up in 6 months. Call sooner if concerns arise.      Relevant Orders   CBC w/Diff     Other   Hyperlipidemia    Chronic.  Controlled.  Continue with current medication regimen.  Did not tolerate Statins.  Labs ordered today.  Return to clinic in 6 months for reevaluation.  Call sooner if concerns arise.        Relevant Medications   amLODipine (NORVASC) 10 MG tablet   losartan (COZAAR) 25 MG tablet   Other Relevant Orders   Lipid Profile   Advance directive discussed with patient    A voluntary discussion about advance care planning including the explanation and discussion of advance directives was extensively discussed  with the patient for 5 minutes with patient present.  Explanation about the health care proxy and Living will was reviewed and packet with forms with explanation of how to fill them out was given.  During this discussion, the patient was able to identify a health care proxy as and plans/does not plan to fill out the paperwork required.  Patient was offered a separate [place] visit for further assistance with forms.        Other Visit Diagnoses     Encounter for annual wellness visit (AWV) in Medicare patient    -  Primary   No concerns at this time. 6CIT score was 0.  No POA or MOST on file and does not have.  Reviewed at visit today.    Need for influenza vaccination       Relevant Orders   Flu Vaccine QUAD High Dose(Fluad) (Completed)   Encounter for screening mammogram for malignant neoplasm of breast       Relevant Orders   MM 3D SCREEN BREAST BILATERAL   Post-menopausal       Relevant Orders   DG Bone Density         Preventative Services:  AAA screening: NA Health Risk Assessment and Personalized Prevention Plan: Done today Bone Mass Measurements: Ordered today Breast Cancer Screening: Ordered today CVD Screening: Done today Cervical Cancer Screening: NA Colon Cancer Screening:  Depression Screening:  Diabetes Screening:  Glaucoma Screening:  Hepatitis B vaccine: Hepatitis C screening:  HIV Screening: Flu Vaccine: Lung cancer Screening: Obesity Screening:  Pneumonia Vaccines (2): STI Screening:  Follow up plan: Return in about 6 months (around 08/23/2022) for HTN, HLD, DM2 FU.   LABORATORY TESTING:  - Pap smear: not applicable  IMMUNIZATIONS:   - Tdap: Tetanus vaccination status reviewed: Not up to date. - Influenza: Administered today - Pneumovax: Up to date - Prevnar: Up to date - Zostavax vaccine: Not applicable  SCREENING: -Mammogram: Ordered today  - Colonoscopy: Up to date  - Bone Density: Ordered today  -Hearing Test: Not applicable  -Spirometry: Not applicable   PATIENT COUNSELING:   Advised to take 1 mg of folate supplement per day if capable  of pregnancy.   Sexuality: Discussed sexually transmitted diseases, partner selection, use of condoms, avoidance of unintended pregnancy  and contraceptive alternatives.   Advised to avoid cigarette smoking.  I discussed with the patient that most people either abstain from alcohol or drink within safe limits (<=14/week and <=4 drinks/occasion for males, <=7/weeks and <= 3 drinks/occasion for females) and that the risk for alcohol disorders and other health effects rises proportionally with the number of drinks per week and how often a drinker exceeds daily limits.  Discussed cessation/primary prevention of drug use and availability of treatment for abuse.   Diet: Encouraged to adjust caloric intake to maintain  or achieve ideal body weight, to reduce intake of dietary saturated fat and total fat, to limit sodium  intake by avoiding high sodium foods and not adding table salt, and to maintain adequate dietary potassium and calcium preferably from fresh fruits, vegetables, and low-fat dairy products.    stressed the importance of regular exercise  Injury prevention: Discussed safety belts, safety helmets, smoke detector, smoking near bedding or upholstery.   Dental health: Discussed importance of regular tooth brushing, flossing, and dental visits.    NEXT PREVENTATIVE PHYSICAL DUE IN 1 YEAR. Return in about 6 months (around 08/23/2022) for HTN, HLD, DM2 FU.

## 2022-02-22 NOTE — Assessment & Plan Note (Signed)
Chronic.  Controlled.  Continue with current medication regimen.  Did not tolerate Statins.  Labs ordered today.  Return to clinic in 6 months for reevaluation.  Call sooner if concerns arise.

## 2022-02-22 NOTE — Assessment & Plan Note (Signed)
Chronic.  Controlled.  Continue with current medication regimen of Amlodipine '5mg'$  daily and Losartan '25mg'$  daily.  Labs ordered today.  Refill sent today. Return to clinic in 6 months for reevaluation.  Call sooner if concerns arise.

## 2022-02-22 NOTE — Assessment & Plan Note (Signed)
A voluntary discussion about advance care planning including the explanation and discussion of advance directives was extensively discussed  with the patient for 5 minutes with patient present.  Explanation about the health care proxy and Living will was reviewed and packet with forms with explanation of how to fill them out was given.  During this discussion, the patient was able to identify a health care proxy as and plans/does not plan to fill out the paperwork required.  Patient was offered a separate [place] visit for further assistance with forms.

## 2022-02-22 NOTE — Assessment & Plan Note (Signed)
Labs ordered today. Will make recommendations based on lab results. Continue to follow up with Nephrology.  Follow up in 6 months. Call sooner if concerns arise.

## 2022-02-22 NOTE — Assessment & Plan Note (Signed)
Chronic. Labs ordered today.  Followed by Nephrology.  Will make recommendations based on lab results. Follow up in 6 months.  Call sooner if concerns arise.

## 2022-02-23 ENCOUNTER — Ambulatory Visit: Payer: Medicare Other | Admitting: Oncology

## 2022-02-23 ENCOUNTER — Encounter: Payer: Self-pay | Admitting: Oncology

## 2022-02-24 ENCOUNTER — Encounter: Payer: Self-pay | Admitting: Oncology

## 2022-02-24 LAB — CBC WITH DIFFERENTIAL/PLATELET
Basophils Absolute: 0 10*3/uL (ref 0.0–0.2)
Basos: 1 %
EOS (ABSOLUTE): 0.2 10*3/uL (ref 0.0–0.4)
Eos: 3 %
Hematocrit: 32.1 % — ABNORMAL LOW (ref 34.0–46.6)
Hemoglobin: 10 g/dL — ABNORMAL LOW (ref 11.1–15.9)
Immature Grans (Abs): 0 10*3/uL (ref 0.0–0.1)
Immature Granulocytes: 0 %
Lymphocytes Absolute: 1.2 10*3/uL (ref 0.7–3.1)
Lymphs: 23 %
MCH: 24.1 pg — ABNORMAL LOW (ref 26.6–33.0)
MCHC: 31.2 g/dL — ABNORMAL LOW (ref 31.5–35.7)
MCV: 77 fL — ABNORMAL LOW (ref 79–97)
Monocytes Absolute: 0.4 10*3/uL (ref 0.1–0.9)
Monocytes: 8 %
Neutrophils Absolute: 3.4 10*3/uL (ref 1.4–7.0)
Neutrophils: 65 %
Platelets: 274 10*3/uL (ref 150–450)
RBC: 4.15 x10E6/uL (ref 3.77–5.28)
RDW: 13.4 % (ref 11.7–15.4)
WBC: 5.2 10*3/uL (ref 3.4–10.8)

## 2022-02-24 LAB — COMPREHENSIVE METABOLIC PANEL
ALT: 10 IU/L (ref 0–32)
AST: 18 IU/L (ref 0–40)
Albumin/Globulin Ratio: 1.5 (ref 1.2–2.2)
Albumin: 4.6 g/dL (ref 3.8–4.8)
Alkaline Phosphatase: 122 IU/L — ABNORMAL HIGH (ref 44–121)
BUN/Creatinine Ratio: 16 (ref 12–28)
BUN: 27 mg/dL (ref 8–27)
Bilirubin Total: <0.2 mg/dL (ref 0.0–1.2)
CO2: 23 mmol/L (ref 20–29)
Calcium: 9.5 mg/dL (ref 8.7–10.3)
Chloride: 106 mmol/L (ref 96–106)
Creatinine, Ser: 1.72 mg/dL — ABNORMAL HIGH (ref 0.57–1.00)
Globulin, Total: 3.1 g/dL (ref 1.5–4.5)
Glucose: 115 mg/dL — ABNORMAL HIGH (ref 70–99)
Potassium: 4.7 mmol/L (ref 3.5–5.2)
Sodium: 143 mmol/L (ref 134–144)
Total Protein: 7.7 g/dL (ref 6.0–8.5)
eGFR: 31 mL/min/{1.73_m2} — ABNORMAL LOW (ref >59)

## 2022-02-24 LAB — HEMOGLOBIN A1C
Est. average glucose Bld gHb Est-mCnc: 160 mg/dL
Hgb A1c MFr Bld: 7.2 % — ABNORMAL HIGH (ref 4.8–5.6)

## 2022-02-24 LAB — LIPID PANEL
Chol/HDL Ratio: 3.4 ratio (ref 0.0–4.4)
Cholesterol, Total: 202 mg/dL — ABNORMAL HIGH (ref 100–199)
HDL: 59 mg/dL (ref >39)
LDL Chol Calc (NIH): 129 mg/dL — ABNORMAL HIGH (ref 0–99)
Triglycerides: 80 mg/dL (ref 0–149)
VLDL Cholesterol Cal: 14 mg/dL (ref 5–40)

## 2022-02-26 NOTE — Progress Notes (Signed)
Please let patient know that her lab work remains stable.  A1c is elevated slightly to 7.2.  I recommend decreasing her carbohydrate intake.  Her kidney function remains stable.  Cholesterol was slightly elevated.  No other concerns at this time.  Continue with current medication regimen.  Follow up as discussed.

## 2022-03-06 ENCOUNTER — Telehealth: Payer: Self-pay

## 2022-03-06 NOTE — Progress Notes (Signed)
    Chronic Care Management Pharmacy Assistant   Name: Angela Russell  MRN: [id number] DOB: 1951-02-22   Reason for Encounter: Disease State   Conditions to be addressed/monitored: DMII   Recent office visits:  02/22/22 [name], NP (Annual Exam) Orders:Labs; Medication changes: Patient reported not taking pembrolizumab and promethazine. Return in about 6 months (around 08/23/2022) for HTN, HLD, DM2 FU.  Recent consult visits:  None since last unsuccessful coordination call on 02/07/22  Hospital visits:  None since last unsuccessful coordination call on 02/07/22   Medications: Outpatient Encounter Medications as of 03/06/2022  Medication Sig   albuterol (VENTOLIN HFA) 108 (90 Base) MCG/ACT inhaler Inhale 2 puffs into the lungs every 4 (four) hours as needed. (Patient not taking: Reported on 02/22/2022)   amLODipine (NORVASC) 10 MG tablet Take 1 tablet (10 mg total) by mouth daily.   bisacodyl (DULCOLAX) 5 MG EC tablet Take 1 tablet (5 mg total) by mouth daily as needed for moderate constipation.   docusate sodium (COLACE) 100 MG capsule Take 1 capsule (100 mg total) by mouth daily.   losartan (COZAAR) 25 MG tablet Take 1 tablet (25 mg total) by mouth daily.   Omega-3 Fatty Acids (FISH OIL PO) Take by mouth.   polyethylene glycol (MIRALAX / GLYCOLAX) 17 g packet Take 17 g by mouth 2 (two) times daily.   Semaglutide (RYBELSUS) 3 MG TABS Take 1 tablet by mouth daily.   senna-docusate (SENOKOT-S) 8.6-50 MG tablet Take 1 tablet by mouth 2 (two) times daily.   Spacer/Aero-Holding Chambers (AEROCHAMBER MV) inhaler Use as instructed   [DISCONTINUED] prochlorperazine (COMPAZINE) 10 MG tablet Take 1 tablet (10 mg total) by mouth every 6 (six) hours as needed (Nausea or vomiting).   Facility-Administered Encounter Medications as of 03/06/2022  Medication   heparin lock flush 100 UNIT/ML injection   Recent Relevant Labs: Lab Results  Component Value Date/Time   HGBA1C 7.2 (H)  02/22/2022 08:52 AM   HGBA1C 6.8 (H) 07/14/2021 08:18 AM   HGBA1C 7.2 (H) 12/06/2017 04:14 PM   HGBA1C 7.5 (H) 09/06/2017 03:30 PM   HGBA1C per care everywhere 10/15/2016 12:00 AM   MICROALBUR 150 (H) 03/11/2020 04:44 PM   MICROALBUR per care everywhere 07/16/2016 12:00 AM    Kidney Function Lab Results  Component Value Date/Time   CREATININE 1.72 (H) 02/22/2022 08:52 AM   CREATININE 1.72 (H) 02/20/2022 10:15 AM   GFRNONAA 31 (L) 02/20/2022 10:15 AM   GFRAA 27 (L) 03/01/2020 01:01 PM   Unsuccessful outbound call made today to assist with:  Diabetic Assessment Call.  Outreach Attempt:  3rd Attempt, to reach patient.  A HIPAA compliant voice message was left requesting a return call.  Instructed patient to call back at  earliest convenience.  Current antihyperglycemic regimen:  Rybelus 3 mg  Note: Patient last A1c was 7.2 on 02/22/22  Adherence Review: Is the patient currently on a STATIN medication? No Is the patient currently on ACE/ARB medication? Yes Does the patient have >5 day gap between last estimated fill dates? No   Care Gaps: Colonoscopy-03/17/21 Diabetic Foot Exam-03/11/20 Mammogram-Never done (Scheduled for 04/19/22) Ophthalmology-06/02/21 Dexa Scan - Never done Annual Well Visit - 02/22/22 Mathis Dad) Micro albumin-NA Hemoglobin A1c- 02/22/22  Star Rating Drugs: Losartan 25 mg- last filled:12/13/21 90 DS, 10/30/21 90 ds  [place] [phone]

## 2022-04-17 ENCOUNTER — Inpatient Hospital Stay (HOSPITAL_BASED_OUTPATIENT_CLINIC_OR_DEPARTMENT_OTHER): Payer: Medicare Other | Admitting: Oncology

## 2022-04-17 ENCOUNTER — Encounter: Payer: Self-pay | Admitting: Oncology

## 2022-04-17 ENCOUNTER — Inpatient Hospital Stay: Payer: Medicare Other | Attending: Oncology

## 2022-04-17 VITALS — BP 158/70 | HR 76 | Temp 96.1°F | Resp 18 | Wt 215.0 lb

## 2022-04-17 DIAGNOSIS — D631 Anemia in chronic kidney disease: Secondary | ICD-10-CM | POA: Diagnosis not present

## 2022-04-17 DIAGNOSIS — C7910 Secondary malignant neoplasm of unspecified urinary organs: Secondary | ICD-10-CM | POA: Diagnosis not present

## 2022-04-17 DIAGNOSIS — N1832 Chronic kidney disease, stage 3b: Secondary | ICD-10-CM

## 2022-04-17 DIAGNOSIS — N184 Chronic kidney disease, stage 4 (severe): Secondary | ICD-10-CM | POA: Diagnosis not present

## 2022-04-17 DIAGNOSIS — E1122 Type 2 diabetes mellitus with diabetic chronic kidney disease: Secondary | ICD-10-CM | POA: Diagnosis not present

## 2022-04-17 DIAGNOSIS — C679 Malignant neoplasm of bladder, unspecified: Secondary | ICD-10-CM | POA: Diagnosis not present

## 2022-04-17 DIAGNOSIS — Z87891 Personal history of nicotine dependence: Secondary | ICD-10-CM | POA: Diagnosis not present

## 2022-04-17 DIAGNOSIS — I129 Hypertensive chronic kidney disease with stage 1 through stage 4 chronic kidney disease, or unspecified chronic kidney disease: Secondary | ICD-10-CM | POA: Insufficient documentation

## 2022-04-17 DIAGNOSIS — Z95828 Presence of other vascular implants and grafts: Secondary | ICD-10-CM

## 2022-04-17 MED ORDER — SODIUM CHLORIDE 0.9% FLUSH
10.0000 mL | Freq: Once | INTRAVENOUS | Status: AC
  Administered 2022-04-17: 10 mL via INTRAVENOUS
  Filled 2022-04-17: qty 10

## 2022-04-17 MED ORDER — HEPARIN SOD (PORK) LOCK FLUSH 100 UNIT/ML IV SOLN
500.0000 [IU] | Freq: Once | INTRAVENOUS | Status: AC
  Administered 2022-04-17: 500 [IU] via INTRAVENOUS
  Filled 2022-04-17: qty 5

## 2022-04-17 NOTE — Assessment & Plan Note (Signed)
Avoid nephrotoxin.,  Encourage hydration.

## 2022-04-17 NOTE — Assessment & Plan Note (Signed)
Hemoglobin 10, stable. Patient prefers to continue iron supplementation to IV Venofer treatments.

## 2022-04-17 NOTE — Assessment & Plan Note (Signed)
#  Metastatic urothelial carcinoma of bladder, sarcomatoid features pelvic sidewall mass biopsy showed metastatic carcinoma consistent with involvement by urothelial carcinoma., Stage IV NED- since 01/2020-patient was on Keytruda every 3 to 4 weeks for 2.5 years.  Shared decision was made to stop treatment and continued surveillance. Labs reviewed and discussed with Check CT abdomen and pelvis without contrast.-Patient prefers to have blood work done after Christmas.

## 2022-04-17 NOTE — Progress Notes (Signed)
Hematology/Oncology follow up note [phone] [phone]   Patient Care Team: [name], NP as PCP - General [place], [name], OD (Optometry) [name], MD as Consulting Physician (Hematology and Oncology) [name], [facility] (Inactive) (Pharmacist)   ASSESSMENT & PLAN:   Bladder carcinoma [facility]) #Metastatic urothelial carcinoma of bladder, sarcomatoid features pelvic sidewall mass biopsy showed metastatic carcinoma consistent with involvement by urothelial carcinoma., Stage IV NED- since 01/2020-patient was on Keytruda every 3 to 4 weeks for 2.5 years.  Shared decision was made to stop treatment and continued surveillance. Labs reviewed and discussed with Check CT abdomen and pelvis without contrast.-Patient prefers to have blood work done after Christmas.  CKD (chronic kidney disease) Avoid nephrotoxin.,  Encourage hydration.  Anemia in stage 4 chronic kidney disease (HCC) Hemoglobin 10, stable. Patient prefers to continue iron supplementation to IV Venofer treatments.   Orders Placed This Encounter  Procedures   CT CHEST ABDOMEN PELVIS WO CONTRAST    To be done later Dec (after 12/25)    Standing Status:   Future    Standing Expiration Date:   04/18/2023    Order Specific Question:   Preferred imaging location?    Answer:   [facility]   CBC with Differential/Platelet    Standing Status:   Future    Standing Expiration Date:   04/18/2023   Comprehensive metabolic panel    Standing Status:   Future    Standing Expiration Date:   04/17/2023   Lactate dehydrogenase    Standing Status:   Future    Standing Expiration Date:   04/18/2023   Iron and TIBC    Standing Status:   Future    Standing Expiration Date:   04/18/2023   Ferritin    Standing Status:   Future    Standing Expiration Date:   04/18/2023   Follow-up in 6 months. All questions were answered. The patient knows to call the clinic with any problems, questions or  concerns.  [name], MD, PhD [facility] Hematology Oncology 04/17/2022    CHIEF COMPLAINTS/REASON FOR VISIT:  Follow up for bladder cancer, anemia.   HISTORY OF PRESENTING ILLNESS:  Angela Russell is a  71 y.o.  female with PMH listed below who was referred to me for evaluation of newly diagnosed bladder cancer. Patient initially presented to emergency room at the end of January 2020 for evaluation of dysuria, hematuria and the left lower quadrant inguinal pain and flank pain.  1/28 2020 CT renal stone study showed suspected irregular wall thickening about the superior bladder, not well assessed due to degree of bladder distention.  Recommend cystoscopy for further evaluation.  No renal stone or obstructive uropathy.  Patient was given IV Rocephin and referred patient for outpatient urology follow-up.  Urine culture was negative.  She again presented to ER after 2 days with similar symptoms.  Patient has 25-pack-year smoking history, quit approximately 20 years ago.  No family history of any urology malignancies 07/03/2018 another CT abdomen pelvis with contrast was done which showed no nephrolithiasis or hydronephrosis is identified.  Bladder is decompressed limiting evaluation.  07/16/2018.urology Dr. [name] - cystoscopy and bilateral retrograde pyelogram on 07/16/2018.  Pyelogram did not show any filling defect or abnormalities.  No hydronephrosis.  Ureteral orifice was not involved with tumor.  There is a large 5 cm posterior wall bladder tumor, bullous and sessile appearing.  Patient underwent TURBT.   Pathology: High-grade urothelial carcinoma, invasive into muscularis propria.  Lymphovascular  invasion is present.  Carcinoma in situ is also identified.  Focal squamous differentiation is noted, areas of invasive carcinoma display pleomorphic/sarcomatoid changes.  T2b  08/07/2018 CT without contrast negative.  2 subpleural right upper lobe nodule 2 to 3 mm likely benign. She also had baseline  audiometry done.  08/04/2018 ddMVAC x 1 cycle, stopped due to intolerance and AKI.  Patient received 1 cycle of dd MVAC, not able to tolerate due to AKI. Patient then was referred to [facility] urology  09/22/2018 patient underwent a cystectomy, pathology pT3a N0 Mx.  11 lymph nodes were harvested and was all negative. Invasive urothelia carcinoma, high grade, with sarcomatoid features.   10/14/2018 patient was admitted due to pyelonephritis and a pelvic fluid collection.  Drain was placed. 10/23/2018 drain was removed. Patient has had difficulties getting to her appointments to [facility]. 02/09/2019, patient presented with abdominal pain. CT concerning for small bowel obstruction and increased size of right pelvic fluid collection concerning for cancer recurrence.  Right hydronephrosis to the level of pelvis and enlarged lymph nodes. Patient had JP drain placed with CT guidance to pelvic fluid collection and drained 400 cc amber fluid.  Culture was negative for growth of microorganisms and cytology was negative for malignancy.-JP drain was removed on the day of discharge. CT-guided core biopsy of pelvic lymph node adenopathy was attempted but not successful.  # Right lower extremity DVT, provoked by Transvenous biopsy  02/26/2019 transvenous biopsy by IR unsuccessful transvenous biopsy of the right pelvis mass. Post biopsy acute thrombus in the right external iliac and common femoral vein.  Patient was recommended to start anticoagulation with Xarelto however due to the co-pay, patient is not able to afford the medication. Anticoagulation regimen was switched to Eliquis 5 mg.  Patient reports that she has been taking it once a day. 03/20/2019 PET showed FDG avid tissue in the cystectomy bed, retroperitoneal and pelvic lymphadenopathy.  Right common iliac DVT.  # increased right lower extremity swelling.  She was started on Eliquis for anticoagulation by [facility].  We clarified with her pharmacy and she was  actually taking Eliquis 2.5 mg twice daily. Right lower extremity swelling has not improved but instead worsened. 03/16/2019 She had ultrasound right lower extremity done which showed persistent extensive proximal right lower extremity DVT.  Anticoagulation regimen has increased to Eliquis 5 mg twice daily.  # establish care with [facility] oncology Dr. [name] for evaluation.  Dr. [name] recommended starting immunotherapy with PD-L1 inhibitor Pembrolizumab 200 mg every 3 weeks.  The sarcomatoid histology may not respond to chemotherapy well, could portend a better chance of response into immunotherapy PET scan after 4 cycles of Keytruda showed single mildly enlarged central mesenteric lymph node in the upper pelvis with SUV 9.6.  No other abnormal hypermetabolic activity was reported.  # #Right lower extremity  femoral vein DVT provoked by transvenous biopsy in the context of cancer recurrence, Status post IV C filter and mechanical thrombectomy.  IVC filter has been removed. She is currently off anticoagulation after she developed hematuria.  # 03/30/2019 Status post IVC filter placement, mechanical thrombectomy.-IVC filter was retrieved in January 2021. # PD-L1 CPS 100%.  # 03/31/2019 started on immunotherapy Keytruda.  # 04/26/2020 CT chest abdomen pelvis was reviewed and discussed with patient. No definitive finding of disease recurrence or metastasis.  Small amount of ascites similar to prior.There is a parastomal hernia containing a small margin of adjacent small bowel.  This is associated with wall thickening in approximately 7  cm segment of bowel adjacent to the hernia.  There is adjacent abnormal stranding of the mesentery and omentum.  # 08/19/2020, CT chest abdomen pelvis showed no evidence of new/progressive metastatic disease. NED Hold off treatment due to uncontrolled hypotension.  # Chronic abdominal discomfort due to hernia. 08/11/2020, patient was seen by Dr. Dolphus Jenny for parastomal  hernia.  Patient has no obstructive symptoms and would like to continue to monitor her symptoms and hold off any intervention at this point.  Patient was recommended to wear ostomy hernia belt and was referred to [facility] wound ostomy nursing team.  04/10/2021 CT chest abdomen pelvis without contrast showed no evidence of disease recurrence.  Small volume fluid in the pelvis, decreased.  Emphysema/diffuse bilateral bronchial wall thickening.  CAD.  She received IV venofer previously and did not tolerate due to energized joint pain afterwards.  #Hernia -parastomal hernia.   She has establish care with [facility] hernia center.  She was recommended to monitor her symptoms and aware ostomy belt  07/31/2021, CT chest abdomen pelvis without contrast showed NED. 09/05/2021 cycle Keytruda.  Shared decision was made to stop Keytruda treatment after 09/05/2021 and continue surveillance.  10/02/2021 - 10/05/2021, patient was admitted due to sepsis secondary to UTI.  Patient also had CT chest angiogram done during the hospitalization which showed no PE or pneumonia..  Lower extremity ultrasound showed negative for DVT.  Patient was provided supportive care IV fluid hydration, broad-spectrum antibiotics.  COVID and influenza negative.  Blood culture positive for E. coli.  Patient was transitioned to oral antibiotics Levaquin and completed 14 days of course.  INTERVAL HISTORY CHAMARI CUTBIRTH is a 71 y.o. female who has above history reviewed by me today presents for follow up visit for evaluation form metastatic high-grade urothelial carcinoma of the bladder. Sarcomatoid feature.   Today patient reports feeling well at baseline.  Denies any nausea vomiting fever or chills.  Patient had blood work done in September 2023 and reschedule for MD visit.  No new complaints today.  She has gained weight.  Review of Systems  Constitutional:  Negative for appetite change, chills, fatigue and fever.  HENT:   Negative for hearing loss and  voice change.   Eyes:  Negative for eye problems.  Respiratory:  Negative for chest tightness and cough.   Cardiovascular:  Negative for chest pain and leg swelling.  Gastrointestinal:  Negative for abdominal distention, abdominal pain, blood in stool and constipation.  Endocrine: Negative for hot flashes.  Genitourinary:  Negative for difficulty urinating and frequency.   Musculoskeletal:  Positive for arthralgias.  Skin:  Negative for itching and rash.  Neurological:  Positive for numbness. Negative for extremity weakness.  Hematological:  Negative for adenopathy.  Psychiatric/Behavioral:  Negative for confusion. The patient is not nervous/anxious.     MEDICAL HISTORY:  Past Medical History:  Diagnosis Date   Carotid artery plaque, right 01/2014   CKD (chronic kidney disease)    stage 2-3   Controlled diabetes mellitus type 2 with complications (HCC)    Diabetes mellitus without complication (HCC)    DM (diabetes mellitus), type 2, uncontrolled    Hyperlipidemia    Hypertension    Hypochromic microcytic anemia    mild   Metastatic urothelial carcinoma ([place]) 03/23/2019   Osteoporosis    Personal history of colonic polyps    Renal insufficiency    Rotator cuff tendonitis, right     SURGICAL HISTORY: Past Surgical History:  Procedure Laterality Date   COLONOSCOPY  WITH PROPOFOL N/A 03/17/2021   Procedure: COLONOSCOPY WITH PROPOFOL;  Surgeon: Jonathon Bellows, MD;  Location: [facility] ENDOSCOPY;  Service: Gastroenterology;  Laterality: N/A;   CYSTOSCOPY W/ RETROGRADES Bilateral 07/16/2018   Procedure: CYSTOSCOPY WITH RETROGRADE PYELOGRAM;  Surgeon: Billey Co, MD;  Location: [facility] ORS;  Service: Urology;  Laterality: Bilateral;   ESOPHAGOGASTRODUODENOSCOPY  03/17/2021   Procedure: ESOPHAGOGASTRODUODENOSCOPY (EGD);  Surgeon: Jonathon Bellows, MD;  Location: [facility] ENDOSCOPY;  Service: Gastroenterology;;   GIVENS CAPSULE STUDY N/A 06/19/2021   Procedure: GIVENS CAPSULE STUDY;  Surgeon:  Jonathon Bellows, MD;  Location: [facility] ENDOSCOPY;  Service: Gastroenterology;  Laterality: N/A;   IVC FILTER INSERTION N/A 03/30/2019   Procedure: IVC FILTER INSERTION;  Surgeon: Algernon Huxley, MD;  Location: [place] CV LAB;  Service: Cardiovascular;  Laterality: N/A;   IVC FILTER REMOVAL N/A 06/15/2019   Procedure: IVC FILTER REMOVAL;  Surgeon: Algernon Huxley, MD;  Location: [place] CV LAB;  Service: Cardiovascular;  Laterality: N/A;   KNEE SURGERY Left 03/17/2013   torn meniscus   PERIPHERAL VASCULAR THROMBECTOMY Right 03/30/2019   Procedure: PERIPHERAL VASCULAR THROMBECTOMY;  Surgeon: Algernon Huxley, MD;  Location: [place] CV LAB;  Service: Cardiovascular;  Laterality: Right;   PORTA CATH INSERTION N/A 08/06/2018   Procedure: PORTA CATH INSERTION;  Surgeon: Algernon Huxley, MD;  Location: [place] CV LAB;  Service: Cardiovascular;  Laterality: N/A;   TOTAL ABDOMINAL HYSTERECTOMY  2000   due to bleeding and fibroids, partial- still has ovaries   TRANSURETHRAL RESECTION OF BLADDER TUMOR N/A 07/16/2018   Procedure: TRANSURETHRAL RESECTION OF BLADDER TUMOR (TURBT);  Surgeon: Billey Co, MD;  Location: [facility] ORS;  Service: Urology;  Laterality: N/A;    SOCIAL HISTORY: Social History   Socioeconomic History   Marital status: Single    Spouse name: Not on file   Number of children: 1   Years of education: Not on file   Highest education level: 10th grade  Occupational History   Not on file  Tobacco Use   Smoking status: Former    Types: Cigarettes    Quit date: 06/05/1991    Years since quitting: 30.8   Smokeless tobacco: Never  Vaping Use   Vaping Use: Never used  Substance and Sexual Activity   Alcohol use: No   Drug use: No   Sexual activity: Not Currently  Other Topics Concern   Not on file  Social History Narrative   Working full time   Social Determinants of Health   Financial Resource Strain: Low Risk  (10/02/2020)   Overall Financial Resource Strain  (CARDIA)    Difficulty of Paying Living Expenses: Not hard at all  Food Insecurity: No Food Insecurity (09/06/2017)   Hunger Vital Sign    Worried About Running Out of Food in the Last Year: Never true    [place] in the Last Year: Never true  Transportation Needs: No Transportation Needs (09/06/2017)   PRAPARE - Hydrologist (Medical): No    Lack of Transportation (Non-Medical): No  Physical Activity: Insufficiently Active (09/06/2017)   Exercise Vital Sign    Days of Exercise per Week: 2 days    Minutes of Exercise per Session: 30 min  Stress: No Stress Concern Present (09/06/2017)   [place]    Feeling of Stress : Not at all  Social Connections: Moderately Integrated (09/06/2017)   Social Connection and Isolation Panel [NHANES]  Frequency of Communication with Friends and Family: More than three times a week    Frequency of Social Gatherings with Friends and Family: More than three times a week    Attends Religious Services: More than 4 times per year    Active Member of [employer] or Organizations: Yes    Attends Archivist Meetings: More than 4 times per year    Marital Status: Separated  Intimate Partner Violence: Not At Risk (09/06/2017)   Humiliation, Afraid, Rape, and Kick questionnaire    Fear of Current or Ex-Partner: No    Emotionally Abused: No    Physically Abused: No    Sexually Abused: No    FAMILY HISTORY: Family History  Problem Relation Age of Onset   Heart disease Mother    Heart attack Mother    Arthritis Father    Diabetes Brother     ALLERGIES:  has No Known Allergies.  MEDICATIONS:  Current Outpatient Medications  Medication Sig Dispense Refill   amLODipine (NORVASC) 10 MG tablet Take 1 tablet (10 mg total) by mouth daily. 90 tablet 0   bisacodyl (DULCOLAX) 5 MG EC tablet Take 1 tablet (5 mg total) by mouth daily as needed for moderate constipation.  30 tablet 0   docusate sodium (COLACE) 100 MG capsule Take 1 capsule (100 mg total) by mouth daily. 90 capsule 0   losartan (COZAAR) 25 MG tablet Take 1 tablet (25 mg total) by mouth daily. 90 tablet 1   Omega-3 Fatty Acids (FISH OIL PO) Take by mouth.     polyethylene glycol (MIRALAX / GLYCOLAX) 17 g packet Take 17 g by mouth 2 (two) times daily. 14 each 0   Semaglutide (RYBELSUS) 3 MG TABS Take 1 tablet by mouth daily. 90 tablet 1   senna-docusate (SENOKOT-S) 8.6-50 MG tablet Take 1 tablet by mouth 2 (two) times daily. 60 tablet 0   No current facility-administered medications for this visit.   Facility-Administered Medications Ordered in Other Visits  Medication Dose Route Frequency Provider Last Rate Last Admin   heparin lock flush 100 UNIT/ML injection              PHYSICAL EXAMINATION: ECOG PERFORMANCE STATUS: 1 - Symptomatic but completely ambulatory Vitals:   04/17/22 1047  BP: (!) 158/70  Pulse: 76  Resp: 18  Temp: (!) 96.1 F (35.6 C)   Filed Weights   04/17/22 1047  Weight: 215 lb (97.5 kg)    Physical Exam Constitutional:      General: She is not in acute distress.    Comments: Walk independently  HENT:     Head: Normocephalic and atraumatic.  Eyes:     General: No scleral icterus.    Pupils: Pupils are equal, round, and reactive to light.  Cardiovascular:     Rate and Rhythm: Normal rate and regular rhythm.     Heart sounds: Normal heart sounds.  Pulmonary:     Effort: Pulmonary effort is normal. No respiratory distress.     Breath sounds: No wheezing.  Abdominal:     General: Bowel sounds are normal. There is no distension.     Palpations: Abdomen is soft. There is no mass.     Comments: Ureterostomy,   Musculoskeletal:        General: No deformity. Normal range of motion.     Cervical back: Normal range of motion and neck supple.  Skin:    General: Skin is warm and dry.     Coloration: Skin  is not pale.     Findings: No erythema or rash.   Neurological:     Mental Status: She is alert and oriented to person, place, and time. Mental status is at baseline.     Cranial Nerves: No cranial nerve deficit.     Coordination: Coordination normal.  Psychiatric:        Mood and Affect: Mood normal.   . LABORATORY DATA:  I have reviewed the data as listed    Latest Ref Rng & Units 02/22/2022    8:52 AM 02/20/2022   10:15 AM 10/04/2021    6:15 AM  CBC  WBC 3.4 - 10.8 x10E3/uL 5.2  5.1  8.0   Hemoglobin 11.1 - 15.9 g/dL 10.0  10.0  8.5   Hematocrit 34.0 - 46.6 % 32.1  32.8  28.0   Platelets 150 - 450 x10E3/uL 274  263  200       Latest Ref Rng & Units 02/22/2022    8:52 AM 02/20/2022   10:15 AM 10/05/2021    5:35 AM  CMP  Glucose 70 - 99 mg/dL 115  119  106   BUN 8 - 27 mg/dL 27  32  25   Creatinine 0.57 - 1.00 mg/dL 1.72  1.72  1.42   Sodium 134 - 144 mmol/L 143  139  141   Potassium 3.5 - 5.2 mmol/L 4.7  4.3  3.7   Chloride 96 - 106 mmol/L 106  109  109   CO2 20 - 29 mmol/L _0 Calcium 8.7 - 10.3 mg/dL 9.5  9.1  8.6   Total Protein 6.0 - 8.5 g/dL 7.7  8.1    Total Bilirubin 0.0 - 1.2 mg/dL <0.2  0.5    Alkaline Phos 44 - 121 IU/L 122  95    AST 0 - 40 IU/L 18  23    ALT 0 - 32 IU/L 10  11       Lab Results  Component Value Date   IRON 77 02/20/2022   TIBC 326 02/20/2022   FERRITIN 175 02/20/2022    RADIOGRAPHIC STUDIES: I have personally reviewed the radiological images as listed and agreed with the findings in the report. No results found.

## 2022-04-18 ENCOUNTER — Telehealth: Payer: Self-pay

## 2022-04-18 NOTE — Progress Notes (Signed)
    Chronic Care Management Pharmacy Assistant   Name: Angela Russell  MRN: [id number] DOB: [birth year]  Reason for Encounter: Disease State   Conditions to be addressed/monitored: DMII   Recent office visits:  None since last unsuccessful coordination call on 03/06/22  Recent consult visits:  04/17/22 [name], MD-Oncology (metastatic urothelial carcinoma) Orders placed: Labs; Medication changes: none  Hospital visits:  None in previous 6 months  Medications: Outpatient Encounter Medications as of 04/18/2022  Medication Sig   amLODipine (NORVASC) 10 MG tablet Take 1 tablet (10 mg total) by mouth daily.   bisacodyl (DULCOLAX) 5 MG EC tablet Take 1 tablet (5 mg total) by mouth daily as needed for moderate constipation.   docusate sodium (COLACE) 100 MG capsule Take 1 capsule (100 mg total) by mouth daily.   losartan (COZAAR) 25 MG tablet Take 1 tablet (25 mg total) by mouth daily.   Omega-3 Fatty Acids (FISH OIL PO) Take by mouth.   polyethylene glycol (MIRALAX / GLYCOLAX) 17 g packet Take 17 g by mouth 2 (two) times daily.   Semaglutide (RYBELSUS) 3 MG TABS Take 1 tablet by mouth daily.   senna-docusate (SENOKOT-S) 8.6-50 MG tablet Take 1 tablet by mouth 2 (two) times daily.   [DISCONTINUED] prochlorperazine (COMPAZINE) 10 MG tablet Take 1 tablet (10 mg total) by mouth every 6 (six) hours as needed (Nausea or vomiting).   Facility-Administered Encounter Medications as of 04/18/2022  Medication   heparin lock flush 100 UNIT/ML injection   Recent Relevant Labs: Lab Results  Component Value Date/Time   HGBA1C 7.2 (H) 02/22/2022 08:52 AM   HGBA1C 6.8 (H) 07/14/2021 08:18 AM   HGBA1C 7.2 (H) 12/06/2017 04:14 PM   HGBA1C 7.5 (H) 09/06/2017 03:30 PM   HGBA1C per care everywhere 10/15/2016 12:00 AM   MICROALBUR 150 (H) 03/11/2020 04:44 PM   MICROALBUR per care everywhere 07/16/2016 12:00 AM    Kidney Function Lab Results  Component Value Date/Time   CREATININE 1.72 (H)  02/22/2022 08:52 AM   CREATININE 1.72 (H) 02/20/2022 10:15 AM   GFRNONAA 31 (L) 02/20/2022 10:15 AM   GFRAA 27 (L) 03/01/2020 01:01 PM    Current antihyperglycemic regimen:  Rybelus 3 mg   Adherence Review: Is the patient currently on a STATIN medication? No Is the patient currently on ACE/ARB medication? Yes Does the patient have >5 day gap between last estimated fill dates? No  Care Gaps: Colonoscopy-03/17/21 Diabetic Foot Exam-02/22/22 Mammogram-Never done (Scheduled for 04/19/22) Ophthalmology-06/02/21 Dexa Scan - Never done Annual Well Visit - 02/22/22 Mathis Dad) Micro albumin-02/22/22 Hemoglobin A1c- 02/22/22 (7.2), 07/14/21 (6.8)   Star Rating Drugs: Losartan 25 mg- last filled:03/18/22 90 ds, 12/13/21 [facility] [phone]

## 2022-04-19 ENCOUNTER — Ambulatory Visit
Admission: RE | Admit: 2022-04-19 | Discharge: 2022-04-19 | Disposition: A | Discharge status: Home / Self Care | Payer: Medicare Other | Source: Ambulatory Visit | Attending: Nurse Practitioner | Admitting: Nurse Practitioner

## 2022-04-19 DIAGNOSIS — Z1231 Encounter for screening mammogram for malignant neoplasm of breast: Secondary | ICD-10-CM | POA: Diagnosis not present

## 2022-04-19 DIAGNOSIS — Z780 Asymptomatic menopausal state: Secondary | ICD-10-CM | POA: Diagnosis not present

## 2022-04-19 NOTE — Progress Notes (Signed)
Please let patient know that her bone density scan is normal.

## 2022-04-23 ENCOUNTER — Encounter: Payer: Self-pay | Admitting: Family Medicine

## 2022-04-23 ENCOUNTER — Other Ambulatory Visit: Payer: Self-pay | Admitting: Nurse Practitioner

## 2022-04-23 DIAGNOSIS — R928 Other abnormal and inconclusive findings on diagnostic imaging of breast: Secondary | ICD-10-CM

## 2022-04-23 DIAGNOSIS — R921 Mammographic calcification found on diagnostic imaging of breast: Secondary | ICD-10-CM

## 2022-04-23 NOTE — Progress Notes (Signed)
Please let patient know that her mammogram was abnormal. The radiologist recommends she have a diagnostic mammogram of her left breast.  Please make sure she has this scheduled.

## 2022-05-25 ENCOUNTER — Telehealth: Payer: Self-pay

## 2022-05-25 NOTE — Chronic Care Management (AMB) (Signed)
    Chronic Care Management Pharmacy Assistant   Name: Angela Russell  MRN: [id number] DOB: [birth year]   Reason for Encounter: Disease State Diabetes Mellitus    Recent office visits:  02/22/22-[name] Dad, NP (PCP) Presents for comprehensive medical examination. Labs ordered. Flu vaccine given. MM 3D SCREEN BREAST BILATERAL ordered and bone density. Follow up in 6 months.   Recent consult visits:  04/17/22-[name], MD (Oncology) Presents for follow up visit.   Hospital visits:  None in previous 6 months  Medications: Outpatient Encounter Medications as of 05/25/2022  Medication Sig   amLODipine (NORVASC) 10 MG tablet Take 1 tablet (10 mg total) by mouth daily.   bisacodyl (DULCOLAX) 5 MG EC tablet Take 1 tablet (5 mg total) by mouth daily as needed for moderate constipation.   docusate sodium (COLACE) 100 MG capsule Take 1 capsule (100 mg total) by mouth daily.   losartan (COZAAR) 25 MG tablet Take 1 tablet (25 mg total) by mouth daily.   Omega-3 Fatty Acids (FISH OIL PO) Take by mouth.   polyethylene glycol (MIRALAX / GLYCOLAX) 17 g packet Take 17 g by mouth 2 (two) times daily.   Semaglutide (RYBELSUS) 3 MG TABS Take 1 tablet by mouth daily.   senna-docusate (SENOKOT-S) 8.6-50 MG tablet Take 1 tablet by mouth 2 (two) times daily.   [DISCONTINUED] prochlorperazine (COMPAZINE) 10 MG tablet Take 1 tablet (10 mg total) by mouth every 6 (six) hours as needed (Nausea or vomiting).   Facility-Administered Encounter Medications as of 05/25/2022  Medication   heparin lock flush 100 UNIT/ML injection   Current antihyperglycemic regimen:  Rybelsus 3 mg take 1 tab daily  Patient verbally confirms she is taking the above medications as directed. Yes  What recent interventions/DTPs have been made to improve glycemic control:  None noted  Have there been any recent hospitalizations or ED visits since last visit with CPP? No  Patient denies hypoglycemic symptoms, including  Pale, Sweaty, Shaky, Hungry, Nervous/irritable, and Vision changes  Patient denies hyperglycemic symptoms, including blurry vision, excessive thirst, fatigue, polyuria, and weakness  How often are you checking your blood sugar? once daily  What are your blood sugars ranging?  Fasting: 05/24/22-120 Before meals:  After meals:  Bedtime:   On insulin? No How many units:N/a  During the week, how often does your blood glucose drop below 70? Never  Are you checking your feet daily/regularly?  Patient states she does check her feet daily.  Care Gaps: Last Annual Wellness Visit? 02/22/22 Last Diabetic Foot Exam? 02/22/22  Adherence Review: Is the patient currently on a STATIN medication? No Is the patient currently on ACE/ARB medication? Yes Does the patient have >5 day gap between last estimated fill dates? No   Care Gaps: DTaP/Tdap/Td:Never done Zoster Vaccines- Shingrix:Never done  Star Rating Drugs: Losartan 25 mg Last filled:03/18/22 90 DS, 12/13/21 90 DS Rybelsus 3 mg Last filled:03/18/22 90 DS, 12/02/21 90 DS  Myriam Elta Russell, [place]

## 2022-05-29 ENCOUNTER — Ambulatory Visit
Admission: RE | Admit: 2022-05-29 | Discharge: 2022-05-29 | Disposition: A | Discharge status: Home / Self Care | Payer: Medicare Other | Source: Ambulatory Visit | Attending: Oncology | Admitting: Oncology

## 2022-05-29 DIAGNOSIS — M160 Bilateral primary osteoarthritis of hip: Secondary | ICD-10-CM | POA: Diagnosis not present

## 2022-05-29 DIAGNOSIS — I700 Atherosclerosis of aorta: Secondary | ICD-10-CM | POA: Diagnosis not present

## 2022-05-29 DIAGNOSIS — C679 Malignant neoplasm of bladder, unspecified: Secondary | ICD-10-CM | POA: Insufficient documentation

## 2022-05-29 DIAGNOSIS — K439 Ventral hernia without obstruction or gangrene: Secondary | ICD-10-CM | POA: Diagnosis not present

## 2022-05-29 DIAGNOSIS — J984 Other disorders of lung: Secondary | ICD-10-CM | POA: Diagnosis not present

## 2022-05-29 DIAGNOSIS — N134 Hydroureter: Secondary | ICD-10-CM | POA: Diagnosis not present

## 2022-05-29 DIAGNOSIS — R911 Solitary pulmonary nodule: Secondary | ICD-10-CM | POA: Diagnosis not present

## 2022-05-29 DIAGNOSIS — C7910 Secondary malignant neoplasm of unspecified urinary organs: Secondary | ICD-10-CM | POA: Insufficient documentation

## 2022-06-01 ENCOUNTER — Ambulatory Visit
Admission: RE | Admit: 2022-06-01 | Discharge: 2022-06-01 | Disposition: A | Discharge status: Home / Self Care | Payer: Medicare Other | Source: Ambulatory Visit | Attending: Nurse Practitioner | Admitting: Nurse Practitioner

## 2022-06-01 ENCOUNTER — Other Ambulatory Visit: Payer: Self-pay | Admitting: Nurse Practitioner

## 2022-06-01 DIAGNOSIS — R928 Other abnormal and inconclusive findings on diagnostic imaging of breast: Secondary | ICD-10-CM | POA: Insufficient documentation

## 2022-06-01 DIAGNOSIS — R921 Mammographic calcification found on diagnostic imaging of breast: Secondary | ICD-10-CM | POA: Diagnosis not present

## 2022-06-12 ENCOUNTER — Inpatient Hospital Stay: Payer: Medicare HMO | Attending: Oncology

## 2022-06-12 DIAGNOSIS — N184 Chronic kidney disease, stage 4 (severe): Secondary | ICD-10-CM | POA: Insufficient documentation

## 2022-06-12 DIAGNOSIS — Z79899 Other long term (current) drug therapy: Secondary | ICD-10-CM | POA: Insufficient documentation

## 2022-06-12 DIAGNOSIS — Z87891 Personal history of nicotine dependence: Secondary | ICD-10-CM | POA: Insufficient documentation

## 2022-06-12 DIAGNOSIS — Z95828 Presence of other vascular implants and grafts: Secondary | ICD-10-CM

## 2022-06-12 DIAGNOSIS — I129 Hypertensive chronic kidney disease with stage 1 through stage 4 chronic kidney disease, or unspecified chronic kidney disease: Secondary | ICD-10-CM | POA: Diagnosis not present

## 2022-06-12 DIAGNOSIS — C679 Malignant neoplasm of bladder, unspecified: Secondary | ICD-10-CM | POA: Diagnosis not present

## 2022-06-12 DIAGNOSIS — D631 Anemia in chronic kidney disease: Secondary | ICD-10-CM | POA: Insufficient documentation

## 2022-06-12 DIAGNOSIS — D0512 Intraductal carcinoma in situ of left breast: Secondary | ICD-10-CM | POA: Diagnosis not present

## 2022-06-12 MED ORDER — HEPARIN SOD (PORK) LOCK FLUSH 100 UNIT/ML IV SOLN
500.0000 [IU] | Freq: Once | INTRAVENOUS | Status: AC
  Administered 2022-06-12: 500 [IU] via INTRAVENOUS
  Filled 2022-06-12: qty 5

## 2022-06-12 MED ORDER — SODIUM CHLORIDE 0.9% FLUSH
10.0000 mL | Freq: Once | INTRAVENOUS | Status: AC
  Administered 2022-06-12: 10 mL via INTRAVENOUS
  Filled 2022-06-12: qty 10

## 2022-06-13 ENCOUNTER — Ambulatory Visit
Admission: RE | Admit: 2022-06-13 | Discharge: 2022-06-13 | Disposition: A | Discharge status: Home / Self Care | Payer: Medicare HMO | Source: Ambulatory Visit | Attending: Nurse Practitioner

## 2022-06-13 ENCOUNTER — Ambulatory Visit
Admission: RE | Admit: 2022-06-13 | Discharge: 2022-06-13 | Disposition: A | Discharge status: Home / Self Care | Payer: Medicare HMO | Source: Ambulatory Visit | Attending: Nurse Practitioner | Admitting: Nurse Practitioner

## 2022-06-13 ENCOUNTER — Encounter: Payer: Self-pay | Admitting: Oncology

## 2022-06-13 DIAGNOSIS — R921 Mammographic calcification found on diagnostic imaging of breast: Secondary | ICD-10-CM

## 2022-06-13 DIAGNOSIS — D0512 Intraductal carcinoma in situ of left breast: Secondary | ICD-10-CM | POA: Diagnosis not present

## 2022-06-13 DIAGNOSIS — R928 Other abnormal and inconclusive findings on diagnostic imaging of breast: Secondary | ICD-10-CM

## 2022-06-13 HISTORY — PX: BREAST BIOPSY: SHX20

## 2022-06-13 MED ORDER — LIDOCAINE HCL (PF) 1 % IJ SOLN
5.0000 mL | Freq: Once | INTRAMUSCULAR | Status: AC
  Administered 2022-06-13: 5 mL

## 2022-06-13 MED ORDER — LIDOCAINE-EPINEPHRINE (PF) 1 %-1:200000 IJ SOLN
20.0000 mL | Freq: Once | INTRAMUSCULAR | Status: AC
  Administered 2022-06-13: 20 mL

## 2022-06-13 MED ORDER — LIDOCAINE-EPINEPHRINE 1 %-1:100000 IJ SOLN
20.0000 mL | Freq: Once | INTRAMUSCULAR | Status: AC
  Administered 2022-06-13: 20 mL

## 2022-06-14 LAB — SURGICAL PATHOLOGY

## 2022-06-15 ENCOUNTER — Encounter: Payer: Self-pay | Admitting: *Deleted

## 2022-06-15 DIAGNOSIS — D0512 Intraductal carcinoma in situ of left breast: Secondary | ICD-10-CM

## 2022-06-18 ENCOUNTER — Telehealth: Payer: Self-pay | Admitting: Oncology

## 2022-06-18 ENCOUNTER — Encounter: Payer: Self-pay | Admitting: Oncology

## 2022-06-18 ENCOUNTER — Encounter: Payer: Self-pay | Admitting: *Deleted

## 2022-06-18 ENCOUNTER — Telehealth: Payer: Self-pay

## 2022-06-18 NOTE — Telephone Encounter (Signed)
PA for Rybelsus initiated and submitted via Cover My Meds. Key: [id number]

## 2022-06-18 NOTE — Telephone Encounter (Signed)
Called and LVM for pt with appt info and req to call if time needed to be adjusted.

## 2022-06-18 NOTE — Progress Notes (Signed)
Received referral for newly diagnosed breast cancer from [facility].  Navigation initiated.  Referral sent to  surgical.   She is an established patient with Dr. Tasia Catchings and will see her on 06/26/22 at 11:15.  She was offered an appointment tomorrow but was unable to get off work with short notice.

## 2022-06-19 ENCOUNTER — Inpatient Hospital Stay: Payer: Medicare HMO | Admitting: Oncology

## 2022-06-19 ENCOUNTER — Encounter: Payer: Self-pay | Admitting: Oncology

## 2022-06-22 ENCOUNTER — Encounter: Payer: Self-pay | Admitting: Oncology

## 2022-06-26 ENCOUNTER — Encounter: Payer: Self-pay | Admitting: Oncology

## 2022-06-26 ENCOUNTER — Encounter: Payer: Self-pay | Admitting: *Deleted

## 2022-06-26 ENCOUNTER — Inpatient Hospital Stay (HOSPITAL_BASED_OUTPATIENT_CLINIC_OR_DEPARTMENT_OTHER): Payer: Medicare HMO | Admitting: Oncology

## 2022-06-26 ENCOUNTER — Inpatient Hospital Stay: Payer: Medicare HMO

## 2022-06-26 VITALS — BP 160/69 | HR 107 | Temp 97.2°F | Wt 217.0 lb

## 2022-06-26 DIAGNOSIS — D0512 Intraductal carcinoma in situ of left breast: Secondary | ICD-10-CM | POA: Diagnosis not present

## 2022-06-26 DIAGNOSIS — C679 Malignant neoplasm of bladder, unspecified: Secondary | ICD-10-CM | POA: Diagnosis not present

## 2022-06-26 DIAGNOSIS — N184 Chronic kidney disease, stage 4 (severe): Secondary | ICD-10-CM

## 2022-06-26 DIAGNOSIS — Z79899 Other long term (current) drug therapy: Secondary | ICD-10-CM | POA: Diagnosis not present

## 2022-06-26 DIAGNOSIS — D631 Anemia in chronic kidney disease: Secondary | ICD-10-CM

## 2022-06-26 DIAGNOSIS — I129 Hypertensive chronic kidney disease with stage 1 through stage 4 chronic kidney disease, or unspecified chronic kidney disease: Secondary | ICD-10-CM | POA: Diagnosis not present

## 2022-06-26 DIAGNOSIS — N1832 Chronic kidney disease, stage 3b: Secondary | ICD-10-CM | POA: Diagnosis not present

## 2022-06-26 DIAGNOSIS — Z87891 Personal history of nicotine dependence: Secondary | ICD-10-CM | POA: Diagnosis not present

## 2022-06-26 LAB — CBC WITH DIFFERENTIAL/PLATELET
Abs Immature Granulocytes: 0.03 10*3/uL (ref 0.00–0.07)
Basophils Absolute: 0 10*3/uL (ref 0.0–0.1)
Basophils Relative: 1 %
Eosinophils Absolute: 0.1 10*3/uL (ref 0.0–0.5)
Eosinophils Relative: 2 %
HCT: 31.7 % — ABNORMAL LOW (ref 36.0–46.0)
Hemoglobin: 10.1 g/dL — ABNORMAL LOW (ref 12.0–15.0)
Immature Granulocytes: 1 %
Lymphocytes Relative: 28 %
Lymphs Abs: 1.6 10*3/uL (ref 0.7–4.0)
MCH: 24.2 pg — ABNORMAL LOW (ref 26.0–34.0)
MCHC: 31.9 g/dL (ref 30.0–36.0)
MCV: 75.8 fL — ABNORMAL LOW (ref 80.0–100.0)
Monocytes Absolute: 0.4 10*3/uL (ref 0.1–1.0)
Monocytes Relative: 7 %
Neutro Abs: 3.4 10*3/uL (ref 1.7–7.7)
Neutrophils Relative %: 61 %
Platelets: 227 10*3/uL (ref 150–400)
RBC: 4.18 MIL/uL (ref 3.87–5.11)
RDW: 14.1 % (ref 11.5–15.5)
WBC: 5.6 10*3/uL (ref 4.0–10.5)
nRBC: 0 % (ref 0.0–0.2)

## 2022-06-26 LAB — COMPREHENSIVE METABOLIC PANEL
ALT: 13 U/L (ref 0–44)
AST: 21 U/L (ref 15–41)
Albumin: 3.9 g/dL (ref 3.5–5.0)
Alkaline Phosphatase: 102 U/L (ref 38–126)
Anion gap: 11 (ref 5–15)
BUN: 28 mg/dL — ABNORMAL HIGH (ref 8–23)
CO2: 24 mmol/L (ref 22–32)
Calcium: 8.9 mg/dL (ref 8.9–10.3)
Chloride: 104 mmol/L (ref 98–111)
Creatinine, Ser: 1.68 mg/dL — ABNORMAL HIGH (ref 0.44–1.00)
GFR, Estimated: 32 mL/min — ABNORMAL LOW (ref >60)
Glucose, Bld: 233 mg/dL — ABNORMAL HIGH (ref 70–99)
Potassium: 4.3 mmol/L (ref 3.5–5.1)
Sodium: 139 mmol/L (ref 135–145)
Total Bilirubin: 0.3 mg/dL (ref 0.3–1.2)
Total Protein: 7.8 g/dL (ref 6.5–8.1)

## 2022-06-26 NOTE — Assessment & Plan Note (Signed)
Imaging and pathology results were reviewed and discussed with patient. The DCIS diagnosis and care plan were discussed with patient in detail.  We discussed that DCIS is a non invasive breast cancer, I recommend surgical evaluation for lumpectomy.  No need for sentinel lymph node biopsy given that she has DCIS.  High-grade DCIS, she has small chance [10%] of invasive component find on the final specimen.  If that is the case, she will need additional sentinel lymph node biopsy. Patient has appointment with Dr. Hampton Abbot for evaluation. After lumpectomy, recommend adjuvant radiation and depending on estrogen receptor status, she may also need adjuvant endocrine therapy.  Check CBC, CMP today.

## 2022-06-26 NOTE — Progress Notes (Signed)
Hematology/Oncology follow up note [phone] [phone]   Patient Care Team: [name], NP as PCP - General Nice, [name], OD (Optometry) [name], MD as Consulting Physician (Hematology and Oncology) [name], [facility] (Inactive) (Pharmacist) Daiva Huge, RN as Oncology Nurse Navigator   ASSESSMENT & PLAN:   Ductal carcinoma in situ (DCIS) of left breast Imaging and pathology results were reviewed and discussed with patient. The DCIS diagnosis and care plan were discussed with patient in detail.  We discussed that DCIS is a non invasive breast cancer, I recommend surgical evaluation for lumpectomy.  No need for sentinel lymph node biopsy given that she has DCIS.  High-grade DCIS, she has small chance [10%] of invasive component find on the final specimen.  If that is the case, she will need additional sentinel lymph node biopsy. Patient has appointment with Dr. Hampton Abbot for evaluation. After lumpectomy, recommend adjuvant radiation and depending on estrogen receptor status, she may also need adjuvant endocrine therapy.  Check CBC, CMP today.   Bladder carcinoma (HCC) #Metastatic urothelial carcinoma of bladder, sarcomatoid features pelvic sidewall mass biopsy showed metastatic carcinoma consistent with involvement by urothelial carcinoma., Stage IV NED- since 01/2020-patient was on Keytruda every 3 to 4 weeks for 2.5 years.  Shared decision was made to stop treatment and continued surveillance. Labs reviewed and discussed with patient Stable CT abdomen and pelvis without contrast- NED Continue surveillance  Anemia in stage 4 chronic kidney disease (HCC) Hemoglobin 10, stable. Patient prefers to continue iron supplementation to IV Venofer treatments.   CKD (chronic kidney disease) Avoid nephrotoxin.,  Encourage hydration.   Orders Placed This Encounter  Procedures   CBC with Differential/Platelet    Standing Status:   Future    Number of  Occurrences:   1    Standing Expiration Date:   06/26/2023   Comprehensive metabolic panel    Standing Status:   Future    Number of Occurrences:   1    Standing Expiration Date:   06/26/2023   Follow-up TBD. All questions were answered. The patient knows to call the clinic with any problems, questions or concerns.  [name], MD, PhD [facility] Hematology Oncology 06/26/2022    CHIEF COMPLAINTS/REASON FOR VISIT:  Follow up for bladder cancer, anemia.   HISTORY OF PRESENTING ILLNESS:  Oncology History  Bladder carcinoma ([place])  07/01/2018 Imaging   CT renal stone study showed suspected irregular wall thickening about the superior bladder, not well assessed due to degree of bladder distention. Recommend cystoscopy for further evaluation    07/03/2018 Imaging   another CT abdomen pelvis with contrast was done which showed no nephrolithiasis or hydronephrosis is identified. Bladder is decompressed limiting evaluation.    07/16/2018 Initial Diagnosis   urology Dr. Diamantina Providence - cystoscopy and bilateral retrograde pyelogram on 07/16/2018.  Pyelogram did not show any filling defect or abnormalities.  No hydronephrosis.  Ureteral orifice was not involved with tumor.  There is a large 5 cm posterior wall bladder tumor, bullous and sessile appearing.  Patient underwent TURBT.    Pathology: High-grade urothelial carcinoma, invasive into muscularis propria.  Lymphovascular invasion is present.  Carcinoma in situ is also identified.  Focal squamous differentiation is noted, areas of invasive carcinoma display pleomorphic/sarcomatoid changes.  T2b     08/04/2018 -  Chemotherapy   Neoadjuvant DD MVAC x 1 cycle, stopped due to intolerance and acute kidney failure.  Patient was sent to [facility] urology for evaluation of cystectomy  08/07/2018 Imaging   CT without contrast negative. 2 subpleural right upper lobe nodule 2 to 3 mm likely benign.    09/22/2018 Surgery    patient underwent a cystectomy, pathology  pT3a N0 Mx.  11 lymph nodes were harvested and was all negative. Invasive urothelia carcinoma, high grade, with sarcomatoid features.    02/09/2019 -  Hospital Admission   Patient has had difficulties following up with [facility].  Presented emergency room on 02/09/2023 abdominal pain.  CT concerning for small bowel obstruction and increased size of right pelvic fluid collection concerning for cancer recurrence.  Right hydronephrosis to the level of pelvis and enlarged lymph nodes. Patient had JP drain placed with CT guidance to pelvic fluid collection and drained 400 cc amber fluid.  Culture was negative for growth of microorganisms and cytology was negative for malignancy.-JP drain was removed on the day of discharge. CT-guided core biopsy of pelvic lymph node adenopathy was attempted but not successful.  She also developed right lower extremity DVT provoked by transvenous biopsy.- Post biopsy acute thrombus in the right external iliac and common femoral vein.  Patient was recommended to start anticoagulation with Xarelto however due to the co-pay, patient is not able to afford the medication.   02/25/2019 Procedure   IR Transcaval pelvic mass biopsy at [facility] showed metastatic carcinoma, consistent with involvement by urothelial carcinoma. PD-L1-CPS 100%   03/16/2019 Imaging   03/16/2019 patient was found to have increased lower extremity swelling, it was found that she was actually taking Eliquis 2.5 mg twice daily.  Repeat right lower extremity ultrasound showed persistent extensive proximal right lower extremity DVT.  Anticoagulation regimen was changed to Eliquis 5 mg twice daily.   03/20/2019 Imaging   PET showed FDG avid tissue in the cystectomy bed, retroperitoneal and pelvic lymphadenopathy. Right common iliac DVT.    03/30/2019 Procedure   Status post IVC filter placement, mechanical thrombectomy.-IVC filter was retrieved in January 2021.    03/31/2019 -  Chemotherapy   Keytruda every 3  weeks.   04/26/2020 Imaging   CT chest abdomen pelvis was reviewed and discussed with patient. No definitive finding of disease recurrence or metastasis.  Small amount of ascites similar to prior.There is a parastomal hernia containing a small margin of adjacent small bowel.  This is associated with wall thickening in approximately 7 cm segment of bowel adjacent to the hernia.  There is adjacent abnormal stranding of the mesentery and omentum.   08/19/2020 Imaging   CT chest abdomen pelvis showed no evidence of new/progressive metastatic disease. NED   # Chronic abdominal discomfort due to hernia. 08/11/2020, patient was seen by Dr. Dolphus Jenny for parastomal hernia.  Patient has no obstructive symptoms and would like to continue to monitor her symptoms and hold off any intervention at this point.  Patient was recommended to wear ostomy hernia belt and was referred to [facility] wound ostomy nursing team.   04/10/2021 Imaging   CT chest abdomen pelvis without contrast showed no evidence of disease recurrence. Small volume fluid in the pelvis, decreased. Emphysema/diffuse bilateral bronchial wall thickening. CAD.    10/02/2021 - 10/05/2021 Hospital Admission   atient was admitted due to sepsis secondary to UTI. Patient also had CT chest angiogram done during the hospitalization which showed no PE or pneumonia.. Lower extremity ultrasound showed negative for DVT. Patient was provided supportive care IV fluid hydration, broad-spectrum antibiotics. COVID and influenza negative. Blood culture positive for E. coli. Patient was transitioned to oral antibiotics Levaquin and completed  14 days of course.    05/10/2022 Imaging   CT chest abdomen pelvis with contrast showed 1. No findings of active malignancy in the chest, abdomen, or pelvis. Stable perivascular density along the pelvic sidewalls likely from previous treated malignancy. 2. Prior cystectomy and ileal conduit. The ileal conduit is no longer as prominent in size  as it was on 11/03/2021. There is some borderline hydroureter of the right mid ureter but no hydronephrosis. 3. Peristomal hernia along the urostomy site contains a loop of distal small bowel. No findings of strangulation or obstruction. 4. Aberrant right subclavian artery passes behind the esophagus.5. Degenerative arthropathy of both hips. Grade 1 degenerative anterolisthesis of L5 on S1. 6. Aortic atherosclerosis.   Metastatic urothelial carcinoma ([place])  03/23/2019 Initial Diagnosis   Metastatic urothelial carcinoma ([place])   03/31/2019 - 09/05/2021 Chemotherapy   Patient is on Treatment Plan : Bladder - Pembrolizumab Q28D     Ductal carcinoma in situ (DCIS) of left breast  04/23/2022 Mammogram   Bilateral screening mammogram showed left breast calcifications warrant further evaluation.  Right breast no findings suspicious for malignancy.   06/01/2022 Mammogram   Unilateral diagnostic mammogram showed 2 groups of indeterminate calcifications in the left breast, 1 in the upper outer left breast spanning 1.3 cm and another in the central inferior left breast spanning 1.2 cm.   06/13/2022 Cancer Staging   Staging form: Breast, AJCC 8th Edition - Clinical stage from 06/13/2022: Stage 0 (cTis (DCIS), cN0, cM0, G3, ER: Unknown, PR: Not Assessed, HER2: Not Assessed) - Signed by [name], MD on 06/26/2022 Stage prefix: Initial diagnosis Nuclear grade: G3 Histologic grading system: 3 grade system   06/13/2022 Initial Diagnosis   Ductal carcinoma in situ (DCIS) of left breast  06/13/2022, patient underwent left breast medial and lateral biopsy. Medial biopsy showed DCIS, grade 3, focal comedo type necrosis present.  Calcifications identified. Left lateral biopsy showed breast parenchyma with focal fibrosis.  No significant atypia, calcifications identified.  Menarche 37 to 72 years of age History of hysterectomy in 2000. Previous use of OCP many years ago.  She is not able to recall how many  years. Denies any hormone replacement therapy.  Denies previous chest radiation. Denies any family history of breast cancer.      INTERVAL HISTORY Angela REWIS is a 72 y.o. female who has above history reviewed by me today presents for evaluation of newly diagnosed left breast DCIS. Patient is known to me for history of metastatic high-grade urothelial carcinoma of the bladder, sarcomatoid feature, currently in remission. Patient denies any breast concerns except mild soreness at the site of biopsy    Review of Systems  Constitutional:  Negative for appetite change, chills, fatigue and fever.  HENT:   Negative for hearing loss and voice change.   Eyes:  Negative for eye problems.  Respiratory:  Negative for chest tightness and cough.   Cardiovascular:  Negative for chest pain and leg swelling.  Gastrointestinal:  Negative for abdominal distention, abdominal pain, blood in stool and constipation.  Endocrine: Negative for hot flashes.  Genitourinary:  Negative for difficulty urinating and frequency.   Musculoskeletal:  Positive for arthralgias.  Skin:  Negative for itching and rash.  Neurological:  Positive for numbness. Negative for extremity weakness.  Hematological:  Negative for adenopathy.  Psychiatric/Behavioral:  Negative for confusion. The patient is not nervous/anxious.     MEDICAL HISTORY:  Past Medical History:  Diagnosis Date   Carotid artery plaque, right 01/2014  CKD (chronic kidney disease)    stage 2-3   Controlled diabetes mellitus type 2 with complications (HCC)    Diabetes mellitus without complication (HCC)    DM (diabetes mellitus), type 2, uncontrolled    Hyperlipidemia    Hypertension    Hypochromic microcytic anemia    mild   Metastatic urothelial carcinoma (HCC) 03/23/2019   Osteoporosis    Personal history of colonic polyps    Renal insufficiency    Rotator cuff tendonitis, right     SURGICAL HISTORY: Past Surgical History:  Procedure  Laterality Date   BREAST BIOPSY Left 06/13/2022   stereo biopsy/x clip/ path pending   BREAST BIOPSY Left 06/13/2022   stereo biopsy/ ribbon clip/ path pending   BREAST BIOPSY Left 06/13/2022   MM LT BREAST BX W LOC DEV EA AD LESION IMG BX SPEC STEREO GUIDE 06/13/2022 [facility]   BREAST BIOPSY Left 06/13/2022   MM LT BREAST BX W LOC DEV 1ST LESION IMAGE BX SPEC STEREO GUIDE 06/13/2022 [facility]   COLONOSCOPY WITH PROPOFOL N/A 03/17/2021   Procedure: COLONOSCOPY WITH PROPOFOL;  Surgeon: Jonathon Bellows, MD;  Location: [facility] ENDOSCOPY;  Service: Gastroenterology;  Laterality: N/A;   CYSTOSCOPY W/ RETROGRADES Bilateral 07/16/2018   Procedure: CYSTOSCOPY WITH RETROGRADE PYELOGRAM;  Surgeon: Billey Co, MD;  Location: [facility] ORS;  Service: Urology;  Laterality: Bilateral;   ESOPHAGOGASTRODUODENOSCOPY  03/17/2021   Procedure: ESOPHAGOGASTRODUODENOSCOPY (EGD);  Surgeon: Jonathon Bellows, MD;  Location: [facility] ENDOSCOPY;  Service: Gastroenterology;;   GIVENS CAPSULE STUDY N/A 06/19/2021   Procedure: GIVENS CAPSULE STUDY;  Surgeon: Jonathon Bellows, MD;  Location: [facility] ENDOSCOPY;  Service: Gastroenterology;  Laterality: N/A;   IVC FILTER INSERTION N/A 03/30/2019   Procedure: IVC FILTER INSERTION;  Surgeon: Algernon Huxley, MD;  Location: [place] CV LAB;  Service: Cardiovascular;  Laterality: N/A;   IVC FILTER REMOVAL N/A 06/15/2019   Procedure: IVC FILTER REMOVAL;  Surgeon: Algernon Huxley, MD;  Location: [place] CV LAB;  Service: Cardiovascular;  Laterality: N/A;   KNEE SURGERY Left 03/17/2013   torn meniscus   PERIPHERAL VASCULAR THROMBECTOMY Right 03/30/2019   Procedure: PERIPHERAL VASCULAR THROMBECTOMY;  Surgeon: Algernon Huxley, MD;  Location: [place] CV LAB;  Service: Cardiovascular;  Laterality: Right;   PORTA CATH INSERTION N/A 08/06/2018   Procedure: PORTA CATH INSERTION;  Surgeon: Algernon Huxley, MD;  Location: [place] CV LAB;  Service: Cardiovascular;  Laterality: N/A;   TOTAL  ABDOMINAL HYSTERECTOMY  2000   due to bleeding and fibroids, partial- still has ovaries   TRANSURETHRAL RESECTION OF BLADDER TUMOR N/A 07/16/2018   Procedure: TRANSURETHRAL RESECTION OF BLADDER TUMOR (TURBT);  Surgeon: Billey Co, MD;  Location: [facility] ORS;  Service: Urology;  Laterality: N/A;    SOCIAL HISTORY: Social History   Socioeconomic History   Marital status: Single    Spouse name: Not on file   Number of children: 1   Years of education: Not on file   Highest education level: 10th grade  Occupational History   Not on file  Tobacco Use   Smoking status: Former    Types: Cigarettes    Quit date: 06/05/1991    Years since quitting: 31.0   Smokeless tobacco: Never  Vaping Use   Vaping Use: Never used  Substance and Sexual Activity   Alcohol use: No   Drug use: No   Sexual activity: Not Currently  Other Topics Concern   Not on file  Social History Narrative   Working full  time   Social Determinants of Health   Financial Resource Strain: Low Risk  (10/02/2020)   Overall Financial Resource Strain (CARDIA)    Difficulty of Paying Living Expenses: Not hard at all  Food Insecurity: No Food Insecurity (09/06/2017)   Hunger Vital Sign    Worried About Running Out of Food in the Last Year: Never true    Ran Out of Food in the Last Year: Never true  Transportation Needs: No Transportation Needs (09/06/2017)   PRAPARE - Hydrologist (Medical): No    Lack of Transportation (Non-Medical): No  Physical Activity: Insufficiently Active (09/06/2017)   Exercise Vital Sign    Days of Exercise per Week: 2 days    Minutes of Exercise per Session: 30 min  Stress: No Stress Concern Present (09/06/2017)   [place]    Feeling of Stress : Not at all  Social Connections: Moderately Integrated (09/06/2017)   Social Connection and Isolation Panel [NHANES]    Frequency of Communication with Friends  and Family: More than three times a week    Frequency of Social Gatherings with Friends and Family: More than three times a week    Attends Religious Services: More than 4 times per year    Active Member of [employer] or Organizations: Yes    Attends Archivist Meetings: More than 4 times per year    Marital Status: Separated  Intimate Partner Violence: Not At Risk (09/06/2017)   Humiliation, Afraid, Rape, and Kick questionnaire    Fear of Current or Ex-Partner: No    Emotionally Abused: No    Physically Abused: No    Sexually Abused: No    FAMILY HISTORY: Family History  Problem Relation Age of Onset   Heart disease Mother    Heart attack Mother    Arthritis Father    Diabetes Brother    Breast cancer Neg Hx     ALLERGIES:  has No Known Allergies.  MEDICATIONS:  Current Outpatient Medications  Medication Sig Dispense Refill   amLODipine (NORVASC) 10 MG tablet Take 1 tablet (10 mg total) by mouth daily. 90 tablet 0   bisacodyl (DULCOLAX) 5 MG EC tablet Take 1 tablet (5 mg total) by mouth daily as needed for moderate constipation. 30 tablet 0   docusate sodium (COLACE) 100 MG capsule Take 1 capsule (100 mg total) by mouth daily. 90 capsule 0   losartan (COZAAR) 25 MG tablet Take 1 tablet (25 mg total) by mouth daily. 90 tablet 1   Omega-3 Fatty Acids (FISH OIL PO) Take by mouth.     polyethylene glycol (MIRALAX / GLYCOLAX) 17 g packet Take 17 g by mouth 2 (two) times daily. 14 each 0   Semaglutide (RYBELSUS) 3 MG TABS Take 1 tablet by mouth daily. 90 tablet 1   senna-docusate (SENOKOT-S) 8.6-50 MG tablet Take 1 tablet by mouth 2 (two) times daily. 60 tablet 0   No current facility-administered medications for this visit.   Facility-Administered Medications Ordered in Other Visits  Medication Dose Route Frequency Provider Last Rate Last Admin   heparin lock flush 100 UNIT/ML injection              PHYSICAL EXAMINATION: ECOG PERFORMANCE STATUS: 1 - Symptomatic but  completely ambulatory Vitals:   06/26/22 1123  BP: (!) 160/69  Pulse: (!) 107  Temp: (!) 97.2 F (36.2 C)  SpO2: 100%   Filed Weights   06/26/22  1123  Weight: 217 lb (98.4 kg)    Physical Exam Constitutional:      General: She is not in acute distress.    Comments: Walk independently  HENT:     Head: Normocephalic and atraumatic.  Eyes:     General: No scleral icterus.    Pupils: Pupils are equal, round, and reactive to light.  Cardiovascular:     Rate and Rhythm: Normal rate and regular rhythm.     Heart sounds: Normal heart sounds.  Pulmonary:     Effort: Pulmonary effort is normal. No respiratory distress.     Breath sounds: No wheezing.  Abdominal:     General: Bowel sounds are normal. There is no distension.     Palpations: Abdomen is soft. There is no mass.     Comments: Ureterostomy,   Musculoskeletal:        General: No deformity. Normal range of motion.     Cervical back: Normal range of motion and neck supple.  Skin:    General: Skin is warm and dry.     Coloration: Skin is not pale.     Findings: No erythema or rash.  Neurological:     Mental Status: She is alert and oriented to person, place, and time. Mental status is at baseline.     Cranial Nerves: No cranial nerve deficit.     Coordination: Coordination normal.  Psychiatric:        Mood and Affect: Mood normal.   . LABORATORY DATA:  I have reviewed the data as listed    Latest Ref Rng & Units 06/26/2022   11:54 AM 02/22/2022    8:52 AM 02/20/2022   10:15 AM  CBC  WBC 4.0 - 10.5 K/uL 5.6  5.2  5.1   Hemoglobin 12.0 - 15.0 g/dL 10.1  10.0  10.0   Hematocrit 36.0 - 46.0 % 31.7  32.1  32.8   Platelets 150 - 400 K/uL 227  274  263       Latest Ref Rng & Units 06/26/2022   11:54 AM 02/22/2022    8:52 AM 02/20/2022   10:15 AM  CMP  Glucose 70 - 99 mg/dL 233  115  119   BUN 8 - 23 mg/dL 28  27  32   Creatinine 0.44 - 1.00 mg/dL 1.68  1.72  1.72   Sodium 135 - 145 mmol/L 139  143  139    Potassium 3.5 - 5.1 mmol/L 4.3  4.7  4.3   Chloride 98 - 111 mmol/L 104  106  109   CO2 22 - 32 mmol/L '24  23  23   '$ Calcium 8.9 - 10.3 mg/dL 8.9  9.5  9.1   Total Protein 6.5 - 8.1 g/dL 7.8  7.7  8.1   Total Bilirubin 0.3 - 1.2 mg/dL 0.3  <0.2  0.5   Alkaline Phos 38 - 126 U/L 102  122  95   AST 15 - 41 U/L '21  18  23   '$ ALT 0 - 44 U/L '13  10  11      '$ Lab Results  Component Value Date   IRON 77 02/20/2022   TIBC 326 02/20/2022   FERRITIN 175 02/20/2022    RADIOGRAPHIC STUDIES: I have personally reviewed the radiological images as listed and agreed with the findings in the report. MM LT BREAST BX W LOC DEV 1ST LESION IMAGE BX SPEC STEREO GUIDE  Addendum Date: 06/18/2022   ADDENDUM REPORT: 06/18/2022 08:01  ADDENDUM: PATHOLOGY revealed: Site A. BREAST, LEFT, MEDIAL; CORE BIOPSIES: - DUCTAL CARCINOMA IN SITU. - NUCLEAR SCORE 3 OF 3. - FOCAL COMEDO TYPE NECROSIS PRESENT. - CALCIFICATIONS IDENTIFIED. Pathology results are CONCORDANT with imaging findings, per Dr. Lillia Mountain. PATHOLOGY revealed: Site B. BREAST, LEFT, LATERAL; CORE BIOPSIES: - BREAST PARENCHYMA WITH FOCAL FIBROSIS. - NO SIGNIFICANT ATYPIA. - CALCIFICATIONS IDENTIFIED. Pathology results are CONCORDANT with imaging findings, per Dr. Lillia Mountain. Pathology results and recommendations below were discussed with patient by telephone on 06/15/2022. Patient reported biopsy site within normal limits with slight tenderness at the site. Post biopsy care instructions were reviewed, questions were answered and my direct phone number was provided to patient. Patient was instructed to call [facility] if any concerns or questions arise related to the biopsy. RECOMMENDATIONS: 1. Surgical and oncological consultation for site A only. Request for surgical and oncological consultation relayed to Casper Harrison RN at [facility] by Electa Sniff RN on 06/15/2022. 2. Consider bilateral breast MRI with and without contrast to determine  extent of breast disease given diagnosis of high grade DCIS. Pathology results reported by Electa Sniff RN on 06/15/2022. Electronically Signed   By: Lillia Mountain M.D.   On: 06/18/2022 08:01   Result Date: 06/18/2022 CLINICAL DATA:  Left breast calcifications. EXAM: LEFT BREAST STEREOTACTIC CORE NEEDLE BIOPSY COMPARISON:  Previous exam(s). FINDINGS: The patient and I discussed the procedure of stereotactic-guided biopsy including benefits and alternatives. We discussed the high likelihood of a successful procedure. We discussed the risks of the procedure including infection, bleeding, tissue injury, clip migration, and inadequate sampling. Informed written consent was given. The usual time out protocol was performed immediately prior to the procedure. Using sterile technique and 1% lidocaine and 1% lidocaine with epinephrine as local anesthetic, under stereotactic guidance, a 9 gauge vacuum assisted device was used to perform core needle biopsy of calcifications in the medial aspect of the left breast using a medial to lateral approach. Specimen radiograph was performed showing calcifications were present in the tissue samples. Specimens with calcifications are identified for pathology. Lesion quadrant: Medial At the conclusion of the procedure, X shaped tissue marker clip was deployed into the biopsy cavity. Follow-up 2-view mammogram was performed and dictated separately. Using sterile technique and 1% lidocaine and 1% lidocaine with epinephrine as local anesthetic, under stereotactic guidance, a 9 gauge vacuum assisted device was used to perform core needle biopsy of calcifications in the upper-outer quadrant of the left breast using a lateral to medial approach. Specimen radiograph was performed showing calcifications are present in the tissue samples. Specimens with calcifications are identified for pathology. Lesion quadrant: Upper-outer quadrant At the conclusion of the procedure, ribbon shaped tissue marker  clip was deployed into the biopsy cavity. Follow-up 2-view mammogram was performed and dictated separately. IMPRESSION: Stereotactic-guided biopsies of the left breast. No apparent complications. Electronically Signed: By: Lillia Mountain M.D. On: 06/13/2022 13:44   MM LT BREAST BX W LOC DEV EA AD LESION IMG BX SPEC STEREO GUIDE  Addendum Date: 06/18/2022   ADDENDUM REPORT: 06/18/2022 08:01 ADDENDUM: PATHOLOGY revealed: Site A. BREAST, LEFT, MEDIAL; CORE BIOPSIES: - DUCTAL CARCINOMA IN SITU. - NUCLEAR SCORE 3 OF 3. - FOCAL COMEDO TYPE NECROSIS PRESENT. - CALCIFICATIONS IDENTIFIED. Pathology results are CONCORDANT with imaging findings, per Dr. Lillia Mountain. PATHOLOGY revealed: Site B. BREAST, LEFT, LATERAL; CORE BIOPSIES: - BREAST PARENCHYMA WITH FOCAL FIBROSIS. - NO SIGNIFICANT ATYPIA. - CALCIFICATIONS IDENTIFIED. Pathology results are CONCORDANT with imaging findings, per Dr.  Lillia Mountain. Pathology results and recommendations below were discussed with patient by telephone on 06/15/2022. Patient reported biopsy site within normal limits with slight tenderness at the site. Post biopsy care instructions were reviewed, questions were answered and my direct phone number was provided to patient. Patient was instructed to call [facility] if any concerns or questions arise related to the biopsy. RECOMMENDATIONS: 1. Surgical and oncological consultation for site A only. Request for surgical and oncological consultation relayed to Casper Harrison RN at [facility] by Electa Sniff RN on 06/15/2022. 2. Consider bilateral breast MRI with and without contrast to determine extent of breast disease given diagnosis of high grade DCIS. Pathology results reported by Electa Sniff RN on 06/15/2022. Electronically Signed   By: Lillia Mountain M.D.   On: 06/18/2022 08:01   Result Date: 06/18/2022 CLINICAL DATA:  Left breast calcifications. EXAM: LEFT BREAST STEREOTACTIC CORE NEEDLE BIOPSY COMPARISON:  Previous exam(s).  FINDINGS: The patient and I discussed the procedure of stereotactic-guided biopsy including benefits and alternatives. We discussed the high likelihood of a successful procedure. We discussed the risks of the procedure including infection, bleeding, tissue injury, clip migration, and inadequate sampling. Informed written consent was given. The usual time out protocol was performed immediately prior to the procedure. Using sterile technique and 1% lidocaine and 1% lidocaine with epinephrine as local anesthetic, under stereotactic guidance, a 9 gauge vacuum assisted device was used to perform core needle biopsy of calcifications in the medial aspect of the left breast using a medial to lateral approach. Specimen radiograph was performed showing calcifications were present in the tissue samples. Specimens with calcifications are identified for pathology. Lesion quadrant: Medial At the conclusion of the procedure, X shaped tissue marker clip was deployed into the biopsy cavity. Follow-up 2-view mammogram was performed and dictated separately. Using sterile technique and 1% lidocaine and 1% lidocaine with epinephrine as local anesthetic, under stereotactic guidance, a 9 gauge vacuum assisted device was used to perform core needle biopsy of calcifications in the upper-outer quadrant of the left breast using a lateral to medial approach. Specimen radiograph was performed showing calcifications are present in the tissue samples. Specimens with calcifications are identified for pathology. Lesion quadrant: Upper-outer quadrant At the conclusion of the procedure, ribbon shaped tissue marker clip was deployed into the biopsy cavity. Follow-up 2-view mammogram was performed and dictated separately. IMPRESSION: Stereotactic-guided biopsies of the left breast. No apparent complications. Electronically Signed: By: Lillia Mountain M.D. On: 06/13/2022 13:44   MM CLIP PLACEMENT LEFT  Result Date: 06/13/2022 CLINICAL DATA:  Status post  stereotactic biopsies the left breast. EXAM: 3D DIAGNOSTIC LEFT MAMMOGRAM POST STEREOTACTIC BIOPSIES COMPARISON:  Previous exam(s). FINDINGS: 3D Mammographic images were obtained following stereotactic guided core biopsies of the left breast. There is an X shaped clip is located approximately 1.1 cm lateral to the biopsied calcifications. Residual calcifications can be use for localization purposes. There is a ribbon shaped clip in the upper-outer quadrant of the left breast in appropriate position. IMPRESSION: Status post stereotactic biopsy of the left breast with the X shaped clip located approximately 1.1 cm lateral to the biopsied calcifications and the ribbon shaped clip in appropriate position in the upper-outer quadrant of the breast. Final Assessment: Post Procedure Mammograms for Marker Placement Electronically Signed   By: Lillia Mountain M.D.   On: 06/13/2022 13:52  MM DIAG BREAST TOMO UNI LEFT  Result Date: 06/01/2022 CLINICAL DATA:  72 year old female presenting as a recall from screening for  possible left breast calcifications. EXAM: DIGITAL DIAGNOSTIC UNILATERAL LEFT MAMMOGRAM WITH TOMOSYNTHESIS TECHNIQUE: Left digital diagnostic mammography and breast tomosynthesis was performed. COMPARISON:  Previous exam(s). ACR Breast Density Category b: There are scattered areas of fibroglandular density. FINDINGS: Spot 2D magnification views and full true lateral tomosynthesis views of the left breast were performed. There are grouped punctate calcifications in the upper outer left breast spanning 1.3 cm. There is a group coarse heterogeneous calcifications in a linear distribution in the central inferior left breast spanning 1.2 cm. IMPRESSION: Two groups of indeterminate calcifications in the left breast 1 in the upper outer left breast spanning 1.3 cm and another in the central inferior left breast spanning 1.2 cm. RECOMMENDATION: Stereotactic core needle biopsy x2 of the left breast. I have discussed the  findings and recommendations with the patient. If applicable, a reminder letter will be sent to the patient regarding the next appointment. BI-RADS CATEGORY  4: Suspicious. Electronically Signed   By: Audie Pinto M.D.   On: 06/01/2022 10:36  CT CHEST ABDOMEN PELVIS WO CONTRAST  Result Date: 05/30/2022 CLINICAL DATA:  Metastatic urothelial carcinoma with sarcomatoid features. Prior cystectomy and urostomy. Prior Keytruda infusions. * Tracking Code: BO * EXAM: CT CHEST, ABDOMEN AND PELVIS WITHOUT CONTRAST TECHNIQUE: Multidetector CT imaging of the chest, abdomen and pelvis was performed following the standard protocol without IV contrast. RADIATION DOSE REDUCTION: This exam was performed according to the departmental dose-optimization program which includes automated exposure control, adjustment of the mA and/or kV according to patient size and/or use of iterative reconstruction technique. COMPARISON:  Multiple exams, including 11/03/2021 and 10/02/2021 FINDINGS: CT CHEST FINDINGS Cardiovascular: Right Port-A-Cath tip: Right atrium. Atherosclerotic calcification of the aortic arch, left anterior descending, right, and circumflex coronary arteries. Aberrant right subclavian artery passes behind the esophagus. Mediastinum/Nodes: No pathologic adenopathy in the chest. Lungs/Pleura: Chronic reticulonodular scarring in the posterior basal segment right lower lobe. A 2 by 3 mm right upper lobe pulmonary nodule is chronically stable on image 54 series 3. Musculoskeletal: Degenerative glenohumeral arthropathy bilaterally. Thoracic spondylosis. CT ABDOMEN PELVIS FINDINGS Hepatobiliary: Unremarkable Pancreas: Unremarkable Spleen: Unremarkable Adrenals/Urinary Tract: Both adrenal glands appear normal. No hydronephrosis. There is some borderline hydroureter of the might right mid ureter. The ileal conduit is no longer as prominent in size as it was on 11/03/2021. Peristomal hernia along the urostomy site contains a  loop of distal small bowel. No findings of strangulation or obstruction. Cystectomy noted. Stomach/Bowel: As noted above there is a peristomal hernia containing distal small bowel extending around the urostomy, without findings of strangulation or obstruction. Vascular/Lymphatic: Atherosclerosis is present, including aortoiliac atherosclerotic disease. No pathologic adenopathy in the upper abdomen. There is a right external iliac vein stent and adjacent to this stent there is some nodularity/soft tissue density along the pelvic sidewall for example on image 94 of series 2 likely representing scarring and treated tumor, measuring about 2.0 cm in thickness on image 94 of series 2 compared to 2.1 cm on 11/03/2021. There is also some indistinct density along the left pelvic sidewall adjacent to the external iliac vessels as on image 93 series 2, with bandlike density in this vicinity measuring about 0.8 cm in thickness on image 93 series 2, stable. No progression or morphologic change. Clips noted along the pelvic sidewall from prior nodal dissection. Reproductive: Hysterectomy.  Adnexa unremarkable. Other: No supplemental non-categorized findings. Musculoskeletal: Degenerative arthropathy of both hips. Grade 1 degenerative anterolisthesis of L5 on S1. IMPRESSION: 1. No findings of active malignancy in  the chest, abdomen, or pelvis. Stable perivascular density along the pelvic sidewalls likely from previous treated malignancy. 2. Prior cystectomy and ileal conduit. The ileal conduit is no longer as prominent in size as it was on 11/03/2021. There is some borderline hydroureter of the right mid ureter but no hydronephrosis. 3. Peristomal hernia along the urostomy site contains a loop of distal small bowel. No findings of strangulation or obstruction. 4. Aberrant right subclavian artery passes behind the esophagus. 5. Degenerative arthropathy of both hips. Grade 1 degenerative anterolisthesis of L5 on S1. 6. Aortic  atherosclerosis. Aortic Atherosclerosis (ICD10-I70.0). Electronically Signed   By: Van Clines M.D.   On: 05/30/2022 21:14   MM 3D SCREEN BREAST BILATERAL  Result Date: 04/23/2022 CLINICAL DATA:  Screening. EXAM: DIGITAL SCREENING BILATERAL MAMMOGRAM WITH TOMOSYNTHESIS AND CAD TECHNIQUE: Bilateral screening digital craniocaudal and mediolateral oblique mammograms were obtained. Bilateral screening digital breast tomosynthesis was performed. The images were evaluated with computer-aided detection. COMPARISON:  Previous exam(s). ACR Breast Density Category b: There are scattered areas of fibroglandular density. FINDINGS: In the left breast, calcifications warrant further evaluation. In the right breast, no findings suspicious for malignancy. IMPRESSION: Further evaluation is suggested for calcifications in the left breast. RECOMMENDATION: Diagnostic mammogram of the left breast. (Code:[id number]) The patient will be contacted regarding the findings, and additional imaging will be scheduled. BI-RADS CATEGORY  0: Incomplete. Need additional imaging evaluation and/or prior mammograms for comparison. Electronically Signed   By: Nolon Nations M.D.   On: 04/23/2022 09:40   DG Bone Density  Result Date: 04/19/2022 EXAM: DUAL X-RAY ABSORPTIOMETRY (DXA) FOR BONE MINERAL DENSITY IMPRESSION: Your patient Kira Hartl completed a BMD test on 04/19/2022 using the [employer] (software version: 14.10) manufactured by [employer]. The following summarizes the results of our evaluation. Technologist: [facility] PATIENT BIOGRAPHICAL: Name: Octavia, Velador Patient ID: [id number] Birth Date: 07-27-1950 Height: 63.0 in. Gender: Female Exam Date: 04/19/2022 Weight: 211.7 lbs. Indications: Advanced Age, Diabetic, Hysterectomy, Postmenopausal Fractures: Treatments DENSITOMETRY RESULTS: Site         Region     Measured Date Measured Age WHO Classification Young Adult T-score BMD         %Change vs.  Previous Significant Change (*) AP Spine L1-L2 3/19/2021 72.6 Normal 0.8 1.273 g/cm2 DualFemur Neck Left 04/19/2021 72.6 Normal 0.0 1.031 g/cm2 Left Forearm Radius 33% 04/19/2022 72.6 Normal 0.2 0.890 g/cm2 ASSESSMENT: The BMD measured at Femur Neck Left is 1.031 g/cm2 with a T-score of 0.0. This patient is considered normal according to [facility]) criteria. The scan quality is good. L-3 & 4 was excluded due to degenerative changes. World Pharmacologist [facility]) criteria for post-menopausal, Caucasian Women: Normal:                   T-score at or above -1 SD Osteopenia/low bone mass: T-score between -1 and -2.5 SD Osteoporosis:             T-score at or below -2.5 SD RECOMMENDATIONS: 1. All patients should optimize calcium and vitamin D intake. 2. Consider FDA-approved medical therapies in postmenopausal women and men aged 64 years and older, based on the following: a. A hip or vertebral(clinical or morphometric) fracture b. T-score < -2.5 at the femoral neck or spine after appropriate evaluation to exclude secondary causes c. Low bone mass (T-score between -1.0 and -2.5 at the femoral neck or spine) and a 10-year probability of a hip fracture > 3% or a 10-year  probability of a major osteoporosis-related fracture > 20% based on the [place]-adapted WHO algorithm 3. Clinician judgment and/or patient preferences may indicate treatment for people with 10-year fracture probabilities above or below these levels FOLLOW-UP: People with diagnosed cases of osteoporosis or at high risk for fracture should have regular bone mineral density tests. For patients eligible for Medicare, routine testing is allowed once every 2 years. The testing frequency can be increased to one year for patients who have rapidly progressing disease, those who are receiving or discontinuing medical therapy to restore bone mass, or have additional risk factors. I have reviewed this report, and agree with the above findings. Mark A.  Thornton Papas, M.D. [facility] Electronically Signed   By: Lavonia Dana M.D.   On: 04/19/2022 11:16

## 2022-06-26 NOTE — Assessment & Plan Note (Signed)
#  Metastatic urothelial carcinoma of bladder, sarcomatoid features pelvic sidewall mass biopsy showed metastatic carcinoma consistent with involvement by urothelial carcinoma., Stage IV NED- since 01/2020-patient was on Keytruda every 3 to 4 weeks for 2.5 years.  Shared decision was made to stop treatment and continued surveillance. Labs reviewed and discussed with patient Stable CT abdomen and pelvis without contrast- NED Continue surveillance

## 2022-06-26 NOTE — Assessment & Plan Note (Signed)
Avoid nephrotoxin.,  Encourage hydration.

## 2022-06-26 NOTE — Assessment & Plan Note (Signed)
Hemoglobin 10, stable. Patient prefers to continue iron supplementation to IV Venofer treatments.

## 2022-06-26 NOTE — Progress Notes (Signed)
Met with patient at initial medical oncology appointment.   Reviewed Breast Cancer treatment handbook.   Care plan summary given to patient.   Reviewed outreach programs and cancer center services.

## 2022-06-29 ENCOUNTER — Encounter: Payer: Self-pay | Admitting: Surgery

## 2022-06-29 ENCOUNTER — Other Ambulatory Visit: Payer: Self-pay | Admitting: Nurse Practitioner

## 2022-06-29 ENCOUNTER — Telehealth: Payer: Self-pay

## 2022-06-29 ENCOUNTER — Ambulatory Visit (INDEPENDENT_AMBULATORY_CARE_PROVIDER_SITE_OTHER): Payer: Medicare HMO | Admitting: Surgery

## 2022-06-29 ENCOUNTER — Other Ambulatory Visit: Payer: Self-pay

## 2022-06-29 VITALS — BP 143/80 | HR 108 | Temp 99.3°F | Ht 63.0 in | Wt 212.0 lb

## 2022-06-29 DIAGNOSIS — D0512 Intraductal carcinoma in situ of left breast: Secondary | ICD-10-CM

## 2022-06-29 NOTE — Telephone Encounter (Signed)
Medical clearance faxed to [name].

## 2022-06-29 NOTE — Patient Instructions (Signed)
Our surgery scheduler will call you within 24-48 hours to schedule your surgery. Please have the Blue surgery sheet availble when speaking with her.  Lumpectomy  A lumpectomy, sometimes called a partial mastectomy, is surgery to remove a cancerous tumor or mass (the lump) from a breast. It is a form of breast-conserving or breast-preservation surgery. This means that the cancerous tissue is removed but the breast remains intact. During a lumpectomy, the portion of the breast that contains the tumor is removed. Lymph nodes under your arm may also be removed. Lymph nodes are part of the body's disease-fighting system (immune system) and are usually the first place where breast cancer spreads. Tell a health care provider about: Any allergies you have. All medicines you are taking, including vitamins, herbs, eye drops, creams, and over-the-counter medicines. Any problems you or family members have had with anesthetic medicines. Any bleeding problems you have. Any surgeries you have had. Any medical conditions you have. Whether you are pregnant or may be pregnant. What are the risks? Generally, this is a safe procedure. However, problems may occur, including: Bleeding. Infection. Allergic reaction to medicines. Pain, swelling, weakness, or numbness in the arm on the side of your surgery. Temporary swelling. Change in the shape of the breast, especially if a large portion is removed. Scar tissue that forms at the surgical site and feels hard to the touch. Blood clots. What happens before the procedure? When to stop eating and drinking Follow instructions from your health care provider about what you may eat and drink. These may include: 8 hours before your procedure Stop eating most foods. Do not eat meat, fried foods, or fatty foods. Eat only light foods, such as toast or crackers. All liquids are okay except energy drinks and alcohol. 6 hours before your procedure Stop eating. Drink only  clear liquids, such as water, clear fruit juice, black coffee, plain tea, and sports drinks. Do not drink energy drinks or alcohol. 2 hours before your procedure Stop drinking all liquids. You may be allowed to take medicines with small sips of water. If you do not follow your health care provider's instructions, your procedure may be delayed or canceled. Medicines Ask your health care provider about: Changing or stopping your regular medicines. These include any diabetes medicines or blood thinners you take. Taking medicines such as aspirin and ibuprofen. These medicines can thin your blood. Do not take them unless your health care provider tells you to. Taking over-the-counter medicines, vitamins, herbs, and supplements. Surgery safety Ask your health care provider how your surgery site will be marked. A procedure may be done to locate and mark the tumor area in the breast (localization). This will guide the surgeon to where the incision will be made. This may be done with: Imaging, such as a mammogram, ultrasound, or MRI. Insertion of a small wire, clip, or seed, or an implant that will reflect a radar signal. Also, ask what steps will be taken to help prevent infection. These may include: Washing skin with a germ-killing soap. Taking antibiotic medicine. General instructions You may have screening tests or exams to get normal measurements of your arm, also called baseline measurements. These can be compared to measurements done after surgery to monitor for swelling (lymphedema) that can develop after having lymph nodes removed. If you will be going home right after the procedure, plan to have a responsible adult: Take you home from the hospital or clinic. You will not be allowed to drive. Care for you for  the time you are told. What happens during the procedure?  An IV will be inserted into one of your veins. You may be given: A sedative. This helps you relax. Anesthesia. This  will: Numb certain areas of your body. Make you fall asleep for surgery. An electric scalpel will be used to reduce bleeding (electrocautery knife). A curved incision that follows the natural curve of your breast will be made. The tumor will be removed along with some of the tissue around it. This will be sent to the lab for testing. Your health care provider may also remove lymph nodes at this time if needed. If the tumor is close to the muscles over your chest, some muscle tissue may also be removed. A small drain tube may be inserted into your breast area or armpit to collect fluid that may build up after surgery. This tube will be connected to a suction bulb on the outside of your body to remove the fluid. The incision will be closed with stitches (sutures). A bandage (dressing) may be placed over the incision. The procedure may vary among health care providers and hospitals. What happens after the procedure? Your blood pressure, heart rate, breathing rate, and blood oxygen level will be monitored until you leave the hospital or clinic. You will be given medicine for pain as needed. You will be encouraged to get up and walk as soon as you can. This will improve blood flow and breathing. Ask for help if you feel weak or unsteady. You may have a drain tube in place for 2-3 days to prevent a collection of blood (hematoma) from developing in the breast. You may have a pressure bandage applied for 1-2 days to prevent bleeding or swelling. Ask your health care provider how to care for your bandage at home. You may be given a tight sleeve to wear over your arm on the side of your surgery. Wear the sleeve as told by your health care provider. Do not drive or operate machinery until your health care provider says that it is safe. Where to find more information American Cancer Society: cancer.[place]: cancer.gov Summary A lumpectomy, sometimes called a partial mastectomy, is  surgery to remove a cancerous tumor or mass (the lump) from a breast. During a lumpectomy, the portion of the breast that contains the tumor is removed. Plan to have someone take you home from the hospital or clinic. You may have a drain tube in place for 2-3 days to prevent a collection of blood (hematoma) from developing in the breast. This information is not intended to replace advice given to you by your health care provider. Make sure you discuss any questions you have with your health care provider. Document Revised: 08/14/2021 Document Reviewed: 07/30/2021 Elsevier Patient Education  [place].

## 2022-06-29 NOTE — H&P (View-Only) (Signed)
06/29/2022  Reason for Visit:  Left breast DCIS  Requesting Provider:  [name], NP  History of Present Illness: Angela Russell is a 72 y.o. female presenting for evaluation of new diagnosis of left breast DCIS.  The patient had a screening mammogram on 04/19/22 which showed areas of calcifications on the left breast.  A diagnostic mammogram showed two areas of calcifications in the upper outer quadrant and in the central inferior breast.  These two areas were biopsied on 06/13/22, and the central area is showing DCIS, comedo type, high grade.  She has recently seen Dr. Tasia Catchings with oncology who recommended lumpectomy.  There's no family history of breast cancer.  She does self breast exams and did not feel any lumps/bumps/masses.  Of note, the patient has a history of stage IV bladder cancer and is s/p cystectomy with ileal conduit in 2020, currently in remission.  Past Medical History: Past Medical History:  Diagnosis Date   Carotid artery plaque, right 01/2014   CKD (chronic kidney disease)    stage 2-3   Controlled diabetes mellitus type 2 with complications (HCC)    Diabetes mellitus without complication (HCC)    DM (diabetes mellitus), type 2, uncontrolled    Hyperlipidemia    Hypertension    Hypochromic microcytic anemia    mild   Metastatic urothelial carcinoma ([place]) 03/23/2019   Osteoporosis    Personal history of colonic polyps    Renal insufficiency    Rotator cuff tendonitis, right      Past Surgical History: Past Surgical History:  Procedure Laterality Date   BREAST BIOPSY Left 06/13/2022   stereo biopsy/x clip/ path pending   BREAST BIOPSY Left 06/13/2022   stereo biopsy/ ribbon clip/ path pending   BREAST BIOPSY Left 06/13/2022   MM LT BREAST BX W LOC DEV EA AD LESION IMG BX SPEC STEREO GUIDE 06/13/2022 [facility]   BREAST BIOPSY Left 06/13/2022   MM LT BREAST BX W LOC DEV 1ST LESION IMAGE BX SPEC STEREO GUIDE 06/13/2022 [facility]   COLONOSCOPY  WITH PROPOFOL N/A 03/17/2021   Procedure: COLONOSCOPY WITH PROPOFOL;  Surgeon: Jonathon Bellows, MD;  Location: [facility] ENDOSCOPY;  Service: Gastroenterology;  Laterality: N/A;   CYSTOSCOPY W/ RETROGRADES Bilateral 07/16/2018   Procedure: CYSTOSCOPY WITH RETROGRADE PYELOGRAM;  Surgeon: Billey Co, MD;  Location: [facility] ORS;  Service: Urology;  Laterality: Bilateral;   ESOPHAGOGASTRODUODENOSCOPY  03/17/2021   Procedure: ESOPHAGOGASTRODUODENOSCOPY (EGD);  Surgeon: Jonathon Bellows, MD;  Location[facility] END[facility]COPY;  Service: Gastroenterology;;   GIVENS CAPSULE STUDY N/A 06/19/2021   Procedure: GIVENS CAPSULE STUDY;  Surgeon: Jonathon Bellows, MD;  Location: [facility] ENDOSCOPY;  Service: Gastroenterology;  Laterality: N/A;   IVC FILTER INSERTION N/A 03/30/2019   Procedure: IVC FILTER INSERTION;  Surgeon: Algernon Huxley, MD;  Location: [place] CV LAB;  Service: Cardiovascular;  Laterality: N/A;   IVC FILTER REMOVAL N/A 06/15/2019   Procedure: IVC FILTER REMOVAL;  Surgeon: Algernon Huxley, MD;  Location: [place] CV LAB;  Service: Cardiovascular;  Laterality: N/A;   KNEE SURGERY Left 03/17/2013   torn meniscus   PERIPHERAL VASCULAR THROMBECTOMY Right 03/30/2019   Procedure: PERIPHERAL VASCULAR THROMBECTOMY;  Surgeon: Algernon Huxley, MD;  Location: [place] CV LAB;  Service: Cardiovascular;  Laterality: Right;   PORTA CATH INSERTION N/A 08/06/2018   Procedure: PORTA CATH INSERTION;  Surgeon: Algernon Huxley, MD;  Location: [place] CV LAB;  Service: Cardiovascular;  Laterality: N/A;   TOTAL ABDOMINAL HYSTERECTOMY  2000   due  to bleeding and fibroids, partial- still has ovaries   TRANSURETHRAL RESECTION OF BLADDER TUMOR N/A 07/16/2018   Procedure: TRANSURETHRAL RESECTION OF BLADDER TUMOR (TURBT);  Surgeon: Billey Co, MD;  Location: [facility] ORS;  Service: Urology;  Laterality: N/A;    Home Medications: Prior to Admission medications   Medication Sig Start Date End Date Taking? Authorizing Provider   amLODipine (NORVASC) 10 MG tablet Take 1 tablet (10 mg total) by mouth daily. 02/22/22  Yes [name], NP  bisacodyl (DULCOLAX) 5 MG EC tablet Take 1 tablet (5 mg total) by mouth daily as needed for moderate constipation. 10/05/21  Yes Nicole Kindred A, DO  docusate sodium (COLACE) 100 MG capsule Take 1 capsule (100 mg total) by mouth daily. 03/22/20  Yes [name], MD  losartan (COZAAR) 25 MG tablet Take 1 tablet (25 mg total) by mouth daily. 02/22/22  Yes [name], NP  Omega-3 Fatty Acids (FISH OIL PO) Take by mouth.   Yes [provider]  polyethylene glycol (MIRALAX / GLYCOLAX) 17 g packet Take 17 g by mouth 2 (two) times daily. 10/05/21  Yes Nicole Kindred A, DO  Semaglutide (RYBELSUS) 3 MG TABS Take 1 tablet by mouth daily. 02/22/22  Yes [name], NP  senna-docusate (SENOKOT-S) 8.6-50 MG tablet Take 1 tablet by mouth 2 (two) times daily. 10/05/21  Yes Nicole Kindred A, DO  prochlorperazine (COMPAZINE) 10 MG tablet Take 1 tablet (10 mg total) by mouth every 6 (six) hours as needed (Nausea or vomiting). 08/03/18 08/26/18  [name], MD    Allergies: No Known Allergies  Social History:  reports that she quit smoking about 31 years ago. Her smoking use included cigarettes. She has never used smokeless tobacco. She reports that she does not drink alcohol and does not use drugs.   Family History: Family History  Problem Relation Age of Onset   Heart disease Mother    Heart attack Mother    Arthritis Father    Diabetes Brother    Breast cancer Neg Hx     Review of Systems: Review of Systems  Constitutional:  Negative for chills and fever.  Respiratory:  Negative for shortness of breath.   Cardiovascular:  Negative for chest pain.  Gastrointestinal:  Negative for abdominal pain, nausea and vomiting.  Genitourinary:  Negative for dysuria.  Musculoskeletal:  Negative for myalgias.  Skin:  Negative for rash.  Neurological:  Negative for dizziness.   Psychiatric/Behavioral:  Negative for depression.     Physical Exam BP (!) 143/80   Pulse (!) 108   Temp 99.3 F (37.4 C) (Oral)   Ht 5' 3"$  (1.6 m)   Wt 212 lb (96.2 kg)   SpO2 98%   BMI 37.55 kg/m  CONSTITUTIONAL: No acute distress, well nourished. HEENT:  Normocephalic, atraumatic, extraocular motion intact. NECK: Trachea is midline, and there is no jugular venous distension.  RESPIRATORY:  Lungs are clear, and breath sounds are equal bilaterally. Normal respiratory effort without pathologic use of accessory muscles. CARDIOVASCULAR: Heart is regular without murmurs, gallops, or rubs. BREAST:  Left breast s/p medial and lateral biopsies, both sites healing well.  No palpable masses, skin changes, or nipple changes.  No left axillary lymphadenopathy.  Right breast without any palpable masses, skin changes, or nipple changes.  No right axillary lymphadenopathy. MUSCULOSKELETAL:  Normal muscle strength and tone in all four extremities.  No peripheral edema or cyanosis. SKIN: Right upper chest port-a-cath in place.  NEUROLOGIC:  Motor and sensation is grossly  normal.  Cranial nerves are grossly intact. PSYCH:  Alert and oriented to person, place and time. Affect is normal.  Laboratory Analysis: Labs from 06/26/22: Na 139, K 4.3, Cl 104, CO2 24, BUN 28, Cr 1.68.  Total bili 0.3, AST 21, ALT 13, Alk Phos 102.  WBC 5.6, Hgb 10.1, Hct 31.7, Plt 227  Left breast biopsies 06/13/22: DIAGNOSIS:  A. BREAST, LEFT, MEDIAL; CORE BIOPSIES:  - DUCTAL CARCINOMA IN SITU.  - NUCLEAR SCORE 3 OF 3.  - FOCAL COMEDO TYPE NECROSIS PRESENT.  - CALCIFICATIONS IDENTIFIED.   B.  BREAST, LEFT, LATERAL; CORE BIOPSIES:  - BREAST PARENCHYMA WITH FOCAL FIBROSIS.  - NO SIGNIFICANT ATYPIA.  - CALCIFICATIONS IDENTIFIED.   Imaging: Left breast mammogram on 06/01/22: FINDINGS: Spot 2D magnification views and full true lateral tomosynthesis views of the left breast were performed. There are grouped  punctate calcifications in the upper outer left breast spanning 1.3 cm. There is a group coarse heterogeneous calcifications in a linear distribution in the central inferior left breast spanning 1.2 cm.   IMPRESSION: Two groups of indeterminate calcifications in the left breast 1 in the upper outer left breast spanning 1.3 cm and another in the central inferior left breast spanning 1.2 cm.   RECOMMENDATION: Stereotactic core needle biopsy x2 of the left breast.   I have discussed the findings and recommendations with the patient. If applicable, a reminder letter will be sent to the patient regarding the next appointment.   BI-RADS CATEGORY  4: Suspicious.  Assessment and Plan: This is a 72 y.o. female with newly diagnosed left breast DCIS  --Discussed with the patient the findings on her mammogram and biopsy results.  The lateral component was negative and does not need excision.  The medial/central biopsy does show DCIS and the recommendation would be for lumpectomy.  Discussed that radiation alone would not be a treatment therapy for her DCIS.  Discussed with her that to do a lumpectomy would also need to do a procedure first to place an RF tag so that the area can be better localized at the time of surgery.  Discussed with her the full plan for procedures including RF tag placement and surgery itself, and reviewed the surgical plan including the planned incision, risks of bleeding, infection, injury to surrounding structures, that this would be an outpatient surgery, post-op pain control, activity restrictions, and she's willing to proceed. --After biopsy results came back, radiology had recommended bilateral breast MRI to evaluate better the extent of her disease given the high grade DCIS.  However, she reports that she is severely claustrophobic and does not with to do an MRI. --All of her questions have been answered.  I spent 55 minutes dedicated to the care of this patient on the  date of this encounter to include pre-visit review of records, face-to-face time with the patient discussing diagnosis and management, and any post-visit coordination of care.   Melvyn Neth, [facility]

## 2022-06-29 NOTE — Progress Notes (Signed)
06/29/2022  Reason for Visit:  Left breast DCIS  Requesting Provider:  Karen Holdsworth, NP  History of Present Illness: Angela Russell is a 71 y.o. female presenting for evaluation of new diagnosis of left breast DCIS.  The patient had a screening mammogram on 04/19/22 which showed areas of calcifications on the left breast.  A diagnostic mammogram showed two areas of calcifications in the upper outer quadrant and in the central inferior breast.  These two areas were biopsied on 06/13/22, and the central area is showing DCIS, comedo type, high grade.  She has recently seen Dr. Yu with oncology who recommended lumpectomy.  There's no family history of breast cancer.  She does self breast exams and did not feel any lumps/bumps/masses.  Of note, the patient has a history of stage IV bladder cancer and is s/p cystectomy with ileal conduit in 2020, currently in remission.  Past Medical History: Past Medical History:  Diagnosis Date   Carotid artery plaque, right 01/2014   CKD (chronic kidney disease)    stage 2-3   Controlled diabetes mellitus type 2 with complications (HCC)    Diabetes mellitus without complication (HCC)    DM (diabetes mellitus), type 2, uncontrolled    Hyperlipidemia    Hypertension    Hypochromic microcytic anemia    mild   Metastatic urothelial carcinoma (HCC) 03/23/2019   Osteoporosis    Personal history of colonic polyps    Renal insufficiency    Rotator cuff tendonitis, right      Past Surgical History: Past Surgical History:  Procedure Laterality Date   BREAST BIOPSY Left 06/13/2022   stereo biopsy/x clip/ path pending   BREAST BIOPSY Left 06/13/2022   stereo biopsy/ ribbon clip/ path pending   BREAST BIOPSY Left 06/13/2022   MM LT BREAST BX W LOC DEV EA AD LESION IMG BX SPEC STEREO GUIDE 06/13/2022 [facility]   BREAST BIOPSY Left 06/13/2022   MM LT BREAST BX W LOC DEV 1ST LESION IMAGE BX SPEC STEREO GUIDE 06/13/2022 [facility]   COLONOSCOPY  WITH PROPOFOL N/A 03/17/2021   Procedure: COLONOSCOPY WITH PROPOFOL;  Surgeon: Anna, Kiran, MD;  Location: [facility]COPY;  Service: Gastroenterology;  Laterality: N/A;   CYSTOSCOPY W/ RETROGRADES Bilateral 07/16/2018   Procedure: CYSTOSCOPY WITH RETROGRADE PYELOGRAM;  Surgeon: Sninsky, Brian C, MD;  Location: [facility];  Service: Urology;  Laterality: Bilateral;   ESOPHAGOGASTRODUODENOSCOPY  03/17/2021   Procedure: ESOPHAGOGASTRODUODENOSCOPY (EGD);  Surgeon: Anna, Kiran, MD;  Location: [facility] ENDOSCOPY;  Service: Gastroenterology;;   GIVENS CAPSULE STUDY N/A 06/19/2021   Procedure: GIVENS CAPSULE STUDY;  Surgeon: Anna, Kiran, MD;  Location: [facility];  Service: Gastroenterology;  Laterality: N/A;   IVC FILTER INSERTION N/A 03/30/2019   Procedure: IVC FILTER INSERTION;  Surgeon: Dew, Jason S, MD;  Location: [facility];  Service: Cardiovascular;  Laterality: N/A;   IVC FILTER REMOVAL N/A 06/15/2019   Procedure: IVC FILTER REMOVAL;  Surgeon: Dew, Jason S, MD;  Location: [facility];  Service: Cardiovascular;  Laterality: N/A;   KNEE SURGERY Left 03/17/2013   torn meniscus   PERIPHERAL VASCULAR THROMBECTOMY Right 03/30/2019   Procedure: PERIPHERAL VASCULAR THROMBECTOMY;  Surgeon: Dew, Jason S, MD;  Location: [facility];  Service: Cardiovascular;  Laterality: Right;   PORTA CATH INSERTION N/A 08/06/2018   Procedure: PORTA CATH INSERTION;  Surgeon: Dew, Jason S, MD;  Location: [facility];  Service: Cardiovascular;  Laterality: N/A;   TOTAL ABDOMINAL HYSTERECTOMY  2000   due   to bleeding and fibroids, partial- still has ovaries   TRANSURETHRAL RESECTION OF BLADDER TUMOR N/A 07/16/2018   Procedure: TRANSURETHRAL RESECTION OF BLADDER TUMOR (TURBT);  Surgeon: Sninsky, Brian C, MD;  Location: [facility];  Service: Urology;  Laterality: N/A;    Home Medications: Prior to Admission medications   Medication Sig Start Date End Date Taking? Authorizing Provider   amLODipine (NORVASC) 10 MG tablet Take 1 tablet (10 mg total) by mouth daily. 02/22/22  Yes Holdsworth, Karen, NP  bisacodyl (DULCOLAX) 5 MG EC tablet Take 1 tablet (5 mg total) by mouth daily as needed for moderate constipation. 10/05/21  Yes Griffith, Kelly A, DO  docusate sodium (COLACE) 100 MG capsule Take 1 capsule (100 mg total) by mouth daily. 03/22/20  Yes Yu, Zhou, MD  losartan (COZAAR) 25 MG tablet Take 1 tablet (25 mg total) by mouth daily. 02/22/22  Yes Holdsworth, Karen, NP  Omega-3 Fatty Acids (FISH OIL PO) Take by mouth.   Yes [provider]  polyethylene glycol (MIRALAX / GLYCOLAX) 17 g packet Take 17 g by mouth 2 (two) times daily. 10/05/21  Yes Griffith, Kelly A, DO  Semaglutide (RYBELSUS) 3 MG TABS Take 1 tablet by mouth daily. 02/22/22  Yes Holdsworth, Karen, NP  senna-docusate (SENOKOT-S) 8.6-50 MG tablet Take 1 tablet by mouth 2 (two) times daily. 10/05/21  Yes Griffith, Kelly A, DO  prochlorperazine (COMPAZINE) 10 MG tablet Take 1 tablet (10 mg total) by mouth every 6 (six) hours as needed (Nausea or vomiting). 08/03/18 08/26/18  Yu, Zhou, MD    Allergies: No Known Allergies  Social History:  reports that she quit smoking about 31 years ago. Her smoking use included cigarettes. She has never used smokeless tobacco. She reports that she does not drink alcohol and does not use drugs.   Family History: Family History  Problem Relation Age of Onset   Heart disease Mother    Heart attack Mother    Arthritis Father    Diabetes Brother    Breast cancer Neg Hx     Review of Systems: Review of Systems  Constitutional:  Negative for chills and fever.  Respiratory:  Negative for shortness of breath.   Cardiovascular:  Negative for chest pain.  Gastrointestinal:  Negative for abdominal pain, nausea and vomiting.  Genitourinary:  Negative for dysuria.  Musculoskeletal:  Negative for myalgias.  Skin:  Negative for rash.  Neurological:  Negative for dizziness.   Psychiatric/Behavioral:  Negative for depression.     Physical Exam BP (!) 143/80   Pulse (!) 108   Temp 99.3 F (37.4 C) (Oral)   Ht 5' 3" (1.6 m)   Wt 212 lb (96.2 kg)   SpO2 98%   BMI 37.55 kg/m  CONSTITUTIONAL: No acute distress, well nourished. HEENT:  Normocephalic, atraumatic, extraocular motion intact. NECK: Trachea is midline, and there is no jugular venous distension.  RESPIRATORY:  Lungs are clear, and breath sounds are equal bilaterally. Normal respiratory effort without pathologic use of accessory muscles. CARDIOVASCULAR: Heart is regular without murmurs, gallops, or rubs. BREAST:  Left breast s/p medial and lateral biopsies, both sites healing well.  No palpable masses, skin changes, or nipple changes.  No left axillary lymphadenopathy.  Right breast without any palpable masses, skin changes, or nipple changes.  No right axillary lymphadenopathy. MUSCULOSKELETAL:  Normal muscle strength and tone in all four extremities.  No peripheral edema or cyanosis. SKIN: Right upper chest port-a-cath in place.  NEUROLOGIC:  Motor and sensation is grossly   normal.  Cranial nerves are grossly intact. PSYCH:  Alert and oriented to person, place and time. Affect is normal.  Laboratory Analysis: Labs from 06/26/22: Na 139, K 4.3, Cl 104, CO2 24, BUN 28, Cr 1.68.  Total bili 0.3, AST 21, ALT 13, Alk Phos 102.  WBC 5.6, Hgb 10.1, Hct 31.7, Plt 227  Left breast biopsies 06/13/22: DIAGNOSIS:  A. BREAST, LEFT, MEDIAL; CORE BIOPSIES:  - DUCTAL CARCINOMA IN SITU.  - NUCLEAR SCORE 3 OF 3.  - FOCAL COMEDO TYPE NECROSIS PRESENT.  - CALCIFICATIONS IDENTIFIED.   B.  BREAST, LEFT, LATERAL; CORE BIOPSIES:  - BREAST PARENCHYMA WITH FOCAL FIBROSIS.  - NO SIGNIFICANT ATYPIA.  - CALCIFICATIONS IDENTIFIED.   Imaging: Left breast mammogram on 06/01/22: FINDINGS: Spot 2D magnification views and full true lateral tomosynthesis views of the left breast were performed. There are grouped  punctate calcifications in the upper outer left breast spanning 1.3 cm. There is a group coarse heterogeneous calcifications in a linear distribution in the central inferior left breast spanning 1.2 cm.   IMPRESSION: Two groups of indeterminate calcifications in the left breast 1 in the upper outer left breast spanning 1.3 cm and another in the central inferior left breast spanning 1.2 cm.   RECOMMENDATION: Stereotactic core needle biopsy x2 of the left breast.   I have discussed the findings and recommendations with the patient. If applicable, a reminder letter will be sent to the patient regarding the next appointment.   BI-RADS CATEGORY  4: Suspicious.  Assessment and Plan: This is a 71 y.o. female with newly diagnosed left breast DCIS  --Discussed with the patient the findings on her mammogram and biopsy results.  The lateral component was negative and does not need excision.  The medial/central biopsy does show DCIS and the recommendation would be for lumpectomy.  Discussed that radiation alone would not be a treatment therapy for her DCIS.  Discussed with her that to do a lumpectomy would also need to do a procedure first to place an RF tag so that the area can be better localized at the time of surgery.  Discussed with her the full plan for procedures including RF tag placement and surgery itself, and reviewed the surgical plan including the planned incision, risks of bleeding, infection, injury to surrounding structures, that this would be an outpatient surgery, post-op pain control, activity restrictions, and she's willing to proceed. --After biopsy results came back, radiology had recommended bilateral breast MRI to evaluate better the extent of her disease given the high grade DCIS.  However, she reports that she is severely claustrophobic and does not with to do an MRI. --All of her questions have been answered.  I spent 55 minutes dedicated to the care of this patient on the  date of this encounter to include pre-visit review of records, face-to-face time with the patient discussing diagnosis and management, and any post-visit coordination of care.   Zeynab Klett Luis Rema Lievanos, MD [facility]    

## 2022-06-29 NOTE — Telephone Encounter (Signed)
Requested Prescriptions  Pending Prescriptions Disp Refills   amLODipine (NORVASC) 10 MG tablet [Pharmacy Med Name: amLODIPine Besylate 10 MG Oral Tablet] 90 tablet 0    Sig: Take 1 tablet by mouth once daily     Cardiovascular: Calcium Channel Blockers 2 Failed - 06/29/2022 12:11 PM      Failed - Last BP in normal range    BP Readings from Last 1 Encounters:  06/29/22 (!) 143/80         Passed - Last Heart Rate in normal range    Pulse Readings from Last 1 Encounters:  06/29/22 (!) 108         Passed - Valid encounter within last 6 months    Recent Outpatient Visits           4 months ago Sales executive for annual wellness visit (AWV) in Medicare patient   [place], NP   11 months ago Hypertension associated with diabetes [facility])   [place] [name], NP   1 year ago Hypertension associated with diabetes [facility])   [place] [name], NP   1 year ago Hypertension associated with diabetes [facility])   [place] [name], NP   1 year ago Controlled type 2 diabetes mellitus with complication, without long-term current use of insulin ([place])   [place] [name], NP       Future Appointments             In 4 days [name], NP [place], [place]   In 1 month [name], NP [place], [facility]

## 2022-07-02 ENCOUNTER — Telehealth: Payer: Self-pay

## 2022-07-02 ENCOUNTER — Other Ambulatory Visit: Payer: Self-pay | Admitting: Surgery

## 2022-07-02 DIAGNOSIS — R928 Other abnormal and inconclusive findings on diagnostic imaging of breast: Secondary | ICD-10-CM

## 2022-07-02 DIAGNOSIS — D0512 Intraductal carcinoma in situ of left breast: Secondary | ICD-10-CM

## 2022-07-02 NOTE — Telephone Encounter (Signed)
Received surgical clearance form for the patient. Please call and schedule appointment for clearance ASAP.  Form placed in incomplete bin until appointment.

## 2022-07-03 ENCOUNTER — Ambulatory Visit
Admission: RE | Admit: 2022-07-03 | Discharge: 2022-07-03 | Disposition: A | Discharge status: Home / Self Care | Payer: Medicare HMO | Source: Ambulatory Visit | Attending: Surgery | Admitting: Surgery

## 2022-07-03 ENCOUNTER — Encounter: Payer: Self-pay | Admitting: Nurse Practitioner

## 2022-07-03 ENCOUNTER — Ambulatory Visit (INDEPENDENT_AMBULATORY_CARE_PROVIDER_SITE_OTHER): Payer: Medicare HMO | Admitting: Nurse Practitioner

## 2022-07-03 ENCOUNTER — Telehealth: Payer: Self-pay | Admitting: Surgery

## 2022-07-03 ENCOUNTER — Encounter: Payer: Self-pay | Admitting: Oncology

## 2022-07-03 VITALS — BP 130/64 | HR 92 | Temp 98.0°F | Wt 218.4 lb

## 2022-07-03 DIAGNOSIS — E782 Mixed hyperlipidemia: Secondary | ICD-10-CM | POA: Diagnosis not present

## 2022-07-03 DIAGNOSIS — N184 Chronic kidney disease, stage 4 (severe): Secondary | ICD-10-CM

## 2022-07-03 DIAGNOSIS — N390 Urinary tract infection, site not specified: Secondary | ICD-10-CM

## 2022-07-03 DIAGNOSIS — R8290 Unspecified abnormal findings in urine: Secondary | ICD-10-CM

## 2022-07-03 DIAGNOSIS — R928 Other abnormal and inconclusive findings on diagnostic imaging of breast: Secondary | ICD-10-CM | POA: Insufficient documentation

## 2022-07-03 DIAGNOSIS — D0512 Intraductal carcinoma in situ of left breast: Secondary | ICD-10-CM | POA: Diagnosis not present

## 2022-07-03 DIAGNOSIS — E118 Type 2 diabetes mellitus with unspecified complications: Secondary | ICD-10-CM

## 2022-07-03 DIAGNOSIS — I10 Essential (primary) hypertension: Secondary | ICD-10-CM | POA: Diagnosis not present

## 2022-07-03 DIAGNOSIS — Z01818 Encounter for other preprocedural examination: Secondary | ICD-10-CM | POA: Diagnosis not present

## 2022-07-03 DIAGNOSIS — D631 Anemia in chronic kidney disease: Secondary | ICD-10-CM

## 2022-07-03 HISTORY — DX: Urinary tract infection, site not specified: N39.0

## 2022-07-03 MED ORDER — LIDOCAINE HCL (PF) 1 % IJ SOLN
10.0000 mL | Freq: Once | INTRAMUSCULAR | Status: AC
  Administered 2022-07-03: 10 mL
  Filled 2022-07-03: qty 10

## 2022-07-03 NOTE — Progress Notes (Unsigned)
BP 130/64   Pulse 92   Temp 98 F (36.7 C) (Oral)   Wt 218 lb 6.4 oz (99.1 kg)   SpO2 97%   BMI 38.69 kg/m    Subjective:    Patient ID: Angela Russell, female    DOB: 1950-09-25, 72 y.o.   MRN: [id number]  HPI: CECILLIA Russell is a 72 y.o. female  Chief Complaint  Patient presents with   Surgical Clearance    Scheduled for breast surgery 07/17/22   Patient states she has surgery scheduled for surgery on 07/17/22.  She will have a lumpectomy.  She has undergone anaesthesia before without any issues.    HYPERTENSION Hypertension status: controlled  Satisfied with current treatment? yes Duration of hypertension: years BP monitoring frequency:  rarely BP range: 130/70-80 BP medication side effects:  no Medication compliance: excellent compliance Previous BP meds:amlodipine and losartan (cozaar) Aspirin: no Recurrent headaches: no Visual changes: no Palpitations: no Dyspnea: no Chest pain: no Lower extremity edema: no Dizzy/lightheaded: no Tried taking cholesterol medication.  However it made her very sore and she was not able to continue.   DIABETES Hypoglycemic episodes:no Polydipsia/polyuria: no Visual disturbance: no Chest pain: no Paresthesias: no Glucose Monitoring: no  Accucheck frequency: Daily  Fasting glucose: 120-130s  Post prandial:  Evening:  Before meals: Taking Insulin?: no  Long acting insulin:  Short acting insulin: Blood Pressure Monitoring: rarely Retinal Examination: Up to Date Foot Exam: Up to Date Diabetic Education: Not Completed Pneumovax: Up to Date Influenza: Up to Date Aspirin: yes  CHRONIC KIDNEY DISEASE CKD status: controlled Medications renally dose: yes Previous renal evaluation: yes Pneumovax:  Up to Date Influenza Vaccine:  Up to Date  ANEMIA Anemia status: stable Etiology of anemia:CKD Duration of anemia treatment:  Compliance with treatment: excellent compliance Iron supplementation side effects:  no Severity of anemia: moderate Fatigue: yes Decreased exercise tolerance: no  Dyspnea on exertion: no Palpitations: no Bleeding: no Pica: no  Relevant past medical, surgical, family and social history reviewed and updated as indicated. Interim medical history since our last visit reviewed. Allergies and medications reviewed and updated.  Review of Systems  Eyes:  Negative for visual disturbance.  Respiratory:  Negative for cough, chest tightness and shortness of breath.   Cardiovascular:  Negative for chest pain, palpitations and leg swelling.  Endocrine: Negative for polydipsia and polyuria.  Neurological:  Negative for dizziness, numbness and headaches.    Per HPI unless specifically indicated above     Objective:    BP 130/64   Pulse 92   Temp 98 F (36.7 C) (Oral)   Wt 218 lb 6.4 oz (99.1 kg)   SpO2 97%   BMI 38.69 kg/m   Wt Readings from Last 3 Encounters:  07/03/22 218 lb 6.4 oz (99.1 kg)  06/29/22 212 lb (96.2 kg)  06/26/22 217 lb (98.4 kg)    Physical Exam Vitals and nursing note reviewed.  Constitutional:      General: She is not in acute distress.    Appearance: Normal appearance. She is normal weight. She is not ill-appearing, toxic-appearing or diaphoretic.  HENT:     Head: Normocephalic.     Right Ear: External ear normal.     Left Ear: External ear normal.     Nose: Nose normal.     Mouth/Throat:     Mouth: Mucous membranes are moist.     Pharynx: Oropharynx is clear.  Eyes:     General:  Right eye: No discharge.        Left eye: No discharge.     Extraocular Movements: Extraocular movements intact.     Conjunctiva/sclera: Conjunctivae normal.     Pupils: Pupils are equal, round, and reactive to light.  Cardiovascular:     Rate and Rhythm: Normal rate and regular rhythm.     Heart sounds: No murmur heard. Pulmonary:     Effort: Pulmonary effort is normal. No respiratory distress.     Breath sounds: Normal breath sounds. No wheezing  or rales.  Musculoskeletal:     Cervical back: Normal range of motion and neck supple.  Skin:    General: Skin is warm and dry.     Capillary Refill: Capillary refill takes less than 2 seconds.  Neurological:     General: No focal deficit present.     Mental Status: She is alert and oriented to person, place, and time. Mental status is at baseline.  Psychiatric:        Mood and Affect: Mood normal.        Behavior: Behavior normal.        Thought Content: Thought content normal.        Judgment: Judgment normal.     Results for orders placed or performed in visit on 07/03/22  Microscopic Examination   Urine  Result Value Ref Range   WBC, UA 6-10 (A) 0 - 5 /hpf   RBC, Urine 3-10 (A) 0 - 2 /hpf   Epithelial Cells (non renal) 0-10 0 - 10 /hpf   Bacteria, UA Moderate (A) None seen/Few  CBC with Differential/Platelet  Result Value Ref Range   WBC 4.2 3.4 - 10.8 x10E3/uL   RBC 4.18 3.77 - 5.28 x10E6/uL   Hemoglobin 10.1 (L) 11.1 - 15.9 g/dL   Hematocrit 32.0 (L) 34.0 - 46.6 %   MCV 77 (L) 79 - 97 fL   MCH 24.2 (L) 26.6 - 33.0 pg   MCHC 31.6 31.5 - 35.7 g/dL   RDW 13.7 11.7 - 15.4 %   Platelets 273 150 - 450 x10E3/uL   Neutrophils 49 Not Estab. %   Lymphs 40 Not Estab. %   Monocytes 7 Not Estab. %   Eos 3 Not Estab. %   Basos 1 Not Estab. %   Neutrophils Absolute 2.1 1.4 - 7.0 x10E3/uL   Lymphocytes Absolute 1.7 0.7 - 3.1 x10E3/uL   Monocytes Absolute 0.3 0.1 - 0.9 x10E3/uL   EOS (ABSOLUTE) 0.1 0.0 - 0.4 x10E3/uL   Basophils Absolute 0.0 0.0 - 0.2 x10E3/uL   Immature Granulocytes 0 Not Estab. %   Immature Grans (Abs) 0.0 0.0 - 0.1 x10E3/uL  Comprehensive metabolic panel  Result Value Ref Range   Glucose 104 (H) 70 - 99 mg/dL   BUN 30 (H) 8 - 27 mg/dL   Creatinine, Ser 1.64 (H) 0.57 - 1.00 mg/dL   eGFR 33 (L) >59 mL/min/1.73   BUN/Creatinine Ratio 18 12 - 28   Sodium 141 134 - 144 mmol/L   Potassium 4.4 3.5 - 5.2 mmol/L   Chloride 105 96 - 106 mmol/L   CO2 19 (L) 20  - 29 mmol/L   Calcium 9.2 8.7 - 10.3 mg/dL   Total Protein 7.2 6.0 - 8.5 g/dL   Albumin 4.5 3.8 - 4.8 g/dL   Globulin, Total 2.7 1.5 - 4.5 g/dL   Albumin/Globulin Ratio 1.7 1.2 - 2.2   Bilirubin Total 0.2 0.0 - 1.2 mg/dL   Alkaline Phosphatase 116  44 - 121 IU/L   AST 17 0 - 40 IU/L   ALT 11 0 - 32 IU/L  Lipid panel  Result Value Ref Range   Cholesterol, Total 198 100 - 199 mg/dL   Triglycerides 204 (H) 0 - 149 mg/dL   HDL 46 >39 mg/dL   VLDL Cholesterol Cal 36 5 - 40 mg/dL   LDL Chol Calc (NIH) 116 (H) 0 - 99 mg/dL   Chol/HDL Ratio 4.3 0.0 - 4.4 ratio  Urinalysis, Routine w reflex microscopic  Result Value Ref Range   Specific Gravity, UA 1.015 1.005 - 1.030   pH, UA 6.0 5.0 - 7.5   Color, UA Yellow Yellow   Appearance Ur Turbid (A) Clear   Leukocytes,UA 2+ (A) Negative   Protein,UA 3+ (A) Negative/Trace   Glucose, UA Negative Negative   Ketones, UA Negative Negative   RBC, UA 1+ (A) Negative   Bilirubin, UA Negative Negative   Urobilinogen, Ur 0.2 0.2 - 1.0 mg/dL   Nitrite, UA Negative Negative   Microscopic Examination See below:   HgB A1c  Result Value Ref Range   Hgb A1c MFr Bld 7.0 (H) 4.8 - 5.6 %   Est. average glucose Bld gHb Est-mCnc 154 mg/dL      Assessment & Plan:   Problem List Items Addressed This Visit       Cardiovascular and Mediastinum   Hypertension    Chronic.  Controlled.  Continue with current medication regimen of Amlodipine '5mg'$  daily and Losartan '25mg'$  daily.  Labs ordered today.  Keep routine follow up appointment.  Call sooner if concerns arise.          Endocrine   Controlled diabetes mellitus type 2 with complications (HCC)    Chronic.  Controlled.  Last A1c 7.2%.  Continue with current medication regimen of Rybelsus '3mg'$ .     Labs ordered today.  Keep regularly scheduled follow up.  Call sooner if concerns arise.        Relevant Orders   HgB A1c (Completed)     Genitourinary   Anemia in stage 4 chronic kidney disease (HCC)  (Chronic)    Chronic.  Has been stable. Baseline creatinine of 1.6.  Labs ordered today.  Followed by Nephrology.       Relevant Orders   CBC with Differential/Platelet (Completed)     Other   Hyperlipidemia    Chronic.  Controlled.  Continue with current medication regimen.   Labs ordered today.  Keep regularly scheduled follow up. Call sooner if concerns arise.        Relevant Orders   Lipid panel (Completed)   Other Visit Diagnoses     Pre-op exam    -  Primary   EKG within normal limits.  Labs are at baseline for patient.  A1c well controlled at 7.0%.  Urine is abnormal. Waiting for culture. Will treat based on results.   Relevant Orders   EKG 12-Lead (Completed)   CBC with Differential/Platelet (Completed)   Comprehensive metabolic panel (Completed)   Lipid panel (Completed)   Urinalysis, Routine w reflex microscopic (Completed)   HgB A1c (Completed)   Abnormal urinalysis       Abnormal urine. Sent out for Culture. Will await results for treatment.  Will likely need to be treated prior to surgery.   Relevant Orders   Urine Culture        Follow up plan: No follow-ups on file.

## 2022-07-03 NOTE — Telephone Encounter (Signed)
Patient has been advised of Pre-Admission date/time, and Surgery date at [facility].  Surgery Date: 07/17/22 Preadmission Testing Date: 07/06/22 (phone 8a-1p)  Patient has been made aware to call [phone], between 1-3:00pm the day before surgery, to find out what time to arrive for surgery.    Patient had RF tag placed at the [facility] 07/03/22.

## 2022-07-03 NOTE — Progress Notes (Signed)
Results discussed with patient during visit.

## 2022-07-03 NOTE — Telephone Encounter (Signed)
Pt scheduled today 1/30 for surgical clearance

## 2022-07-04 LAB — CBC WITH DIFFERENTIAL/PLATELET
Basophils Absolute: 0 10*3/uL (ref 0.0–0.2)
Basos: 1 %
EOS (ABSOLUTE): 0.1 10*3/uL (ref 0.0–0.4)
Eos: 3 %
Hematocrit: 32 % — ABNORMAL LOW (ref 34.0–46.6)
Hemoglobin: 10.1 g/dL — ABNORMAL LOW (ref 11.1–15.9)
Immature Grans (Abs): 0 10*3/uL (ref 0.0–0.1)
Immature Granulocytes: 0 %
Lymphocytes Absolute: 1.7 10*3/uL (ref 0.7–3.1)
Lymphs: 40 %
MCH: 24.2 pg — ABNORMAL LOW (ref 26.6–33.0)
MCHC: 31.6 g/dL (ref 31.5–35.7)
MCV: 77 fL — ABNORMAL LOW (ref 79–97)
Monocytes Absolute: 0.3 10*3/uL (ref 0.1–0.9)
Monocytes: 7 %
Neutrophils Absolute: 2.1 10*3/uL (ref 1.4–7.0)
Neutrophils: 49 %
Platelets: 273 10*3/uL (ref 150–450)
RBC: 4.18 x10E6/uL (ref 3.77–5.28)
RDW: 13.7 % (ref 11.7–15.4)
WBC: 4.2 10*3/uL (ref 3.4–10.8)

## 2022-07-04 LAB — LIPID PANEL
Chol/HDL Ratio: 4.3 ratio (ref 0.0–4.4)
Cholesterol, Total: 198 mg/dL (ref 100–199)
HDL: 46 mg/dL (ref >39)
LDL Chol Calc (NIH): 116 mg/dL — ABNORMAL HIGH (ref 0–99)
Triglycerides: 204 mg/dL — ABNORMAL HIGH (ref 0–149)
VLDL Cholesterol Cal: 36 mg/dL (ref 5–40)

## 2022-07-04 LAB — COMPREHENSIVE METABOLIC PANEL
ALT: 11 IU/L (ref 0–32)
AST: 17 IU/L (ref 0–40)
Albumin/Globulin Ratio: 1.7 (ref 1.2–2.2)
Albumin: 4.5 g/dL (ref 3.8–4.8)
Alkaline Phosphatase: 116 IU/L (ref 44–121)
BUN/Creatinine Ratio: 18 (ref 12–28)
BUN: 30 mg/dL — ABNORMAL HIGH (ref 8–27)
Bilirubin Total: 0.2 mg/dL (ref 0.0–1.2)
CO2: 19 mmol/L — ABNORMAL LOW (ref 20–29)
Calcium: 9.2 mg/dL (ref 8.7–10.3)
Chloride: 105 mmol/L (ref 96–106)
Creatinine, Ser: 1.64 mg/dL — ABNORMAL HIGH (ref 0.57–1.00)
Globulin, Total: 2.7 g/dL (ref 1.5–4.5)
Glucose: 104 mg/dL — ABNORMAL HIGH (ref 70–99)
Potassium: 4.4 mmol/L (ref 3.5–5.2)
Sodium: 141 mmol/L (ref 134–144)
Total Protein: 7.2 g/dL (ref 6.0–8.5)
eGFR: 33 mL/min/{1.73_m2} — ABNORMAL LOW (ref >59)

## 2022-07-04 LAB — MICROSCOPIC EXAMINATION

## 2022-07-04 LAB — URINALYSIS, ROUTINE W REFLEX MICROSCOPIC
Bilirubin, UA: NEGATIVE
Glucose, UA: NEGATIVE
Ketones, UA: NEGATIVE
Nitrite, UA: NEGATIVE
Specific Gravity, UA: 1.015 (ref 1.005–1.030)
Urobilinogen, Ur: 0.2 mg/dL (ref 0.2–1.0)
pH, UA: 6 (ref 5.0–7.5)

## 2022-07-04 LAB — HEMOGLOBIN A1C
Est. average glucose Bld gHb Est-mCnc: 154 mg/dL
Hgb A1c MFr Bld: 7 % — ABNORMAL HIGH (ref 4.8–5.6)

## 2022-07-04 NOTE — Assessment & Plan Note (Signed)
Chronic.  Controlled.  Continue with current medication regimen.   Labs ordered today.  Keep regularly scheduled follow up. Call sooner if concerns arise.

## 2022-07-04 NOTE — Assessment & Plan Note (Signed)
Chronic.  Controlled.  Continue with current medication regimen of Amlodipine '5mg'$  daily and Losartan '25mg'$  daily.  Labs ordered today.  Keep routine follow up appointment.  Call sooner if concerns arise.

## 2022-07-04 NOTE — Progress Notes (Signed)
Please let patient know that her lab work shows that her anemia is stable, kidney function is stable.  A1c is well controlled at 7.0.  Her urine was abnormal and I have sent it out for culture.  If it grows bacteria I will treat her for UTI.  We will keep her updated.

## 2022-07-04 NOTE — Assessment & Plan Note (Signed)
Chronic.  Has been stable. Baseline creatinine of 1.6.  Labs ordered today.  Followed by Nephrology.

## 2022-07-04 NOTE — Assessment & Plan Note (Signed)
Chronic.  Controlled.  Last A1c 7.2%.  Continue with current medication regimen of Rybelsus '3mg'$ .     Labs ordered today.  Keep regularly scheduled follow up.  Call sooner if concerns arise.

## 2022-07-05 ENCOUNTER — Telehealth: Payer: Self-pay

## 2022-07-05 MED ORDER — NITROFURANTOIN MONOHYD MACRO 100 MG PO CAPS: 100.0000 mg | ORAL_CAPSULE | Freq: Two times a day (BID) | ORAL | 0 refills | Status: DC

## 2022-07-05 NOTE — Addendum Note (Signed)
Addended by: [name] on: 07/05/2022 12:06 PM   Modules accepted: Orders

## 2022-07-05 NOTE — Telephone Encounter (Signed)
Received medical clearance from [name], NP. Pt's risk assessment is medium and is optimized for surgery. Being treated for UTI.

## 2022-07-05 NOTE — Progress Notes (Signed)
Please let patient know it does look like she has a UTI.  I have sent in a prescription for Macrobid to treat it.  I may need to change it if based on the final results of the lab.

## 2022-07-06 ENCOUNTER — Encounter
Admission: RE | Admit: 2022-07-06 | Discharge: 2022-07-06 | Disposition: A | Discharge status: Home / Self Care | Payer: Medicare HMO | Source: Ambulatory Visit | Attending: Surgery | Admitting: Surgery

## 2022-07-06 DIAGNOSIS — Z01818 Encounter for other preprocedural examination: Secondary | ICD-10-CM

## 2022-07-06 HISTORY — DX: Acute embolism and thrombosis of unspecified deep veins of unspecified lower extremity: I82.409

## 2022-07-06 HISTORY — DX: Parastomal hernia without obstruction or gangrene: K43.5

## 2022-07-06 HISTORY — DX: Sepsis, unspecified organism: A41.9

## 2022-07-06 HISTORY — DX: Malignant neoplasm of bladder, unspecified: C67.9

## 2022-07-06 HISTORY — DX: Intraductal carcinoma in situ of unspecified breast: D05.10

## 2022-07-06 HISTORY — DX: Presence of other vascular implants and grafts: Z95.828

## 2022-07-06 LAB — URINE CULTURE

## 2022-07-06 NOTE — Patient Instructions (Addendum)
Your procedure is scheduled on:07-17-22 Tuesday Report to the Registration Desk on the 1st floor of the [facility].Then proceed to the 2nd floor Surgery Desk To find out your arrival time, please call [phone] between 1PM - 3PM on:07-16-22 Monday If your arrival time is 6:00 am, do not arrive prior to that time as the [place] entrance doors do not open until 6:00 am.  REMEMBER: Instructions that are not followed completely may result in serious medical risk, up to and including death; or upon the discretion of your surgeon and anesthesiologist your surgery may need to be rescheduled.  Do not eat food after midnight the night before surgery.  No gum chewing, lozengers or hard candies.  You may however, drink Water up to 2 hours before you are scheduled to arrive for your surgery. Do not drink anything within 2 hours of your scheduled arrival time.  Type 1 and Type 2 diabetics should only drink water.  TAKE THESE MEDICATIONS THE MORNING OF SURGERY WITH A SIP OF WATER: -amLODipine (NORVASC)   Stop your Semaglutide [facility] 1 day prior to your surgery-Your last dose will be on 07-15-22 Sunday  One week prior to surgery: Stop Anti-inflammatories (NSAIDS) such as Advil, Aleve, Ibuprofen, Motrin, Naproxen, Naprosyn and Aspirin based products such as Excedrin, Goodys Powder, BC Powder.You may however, take Tylenol if needed for pain up until the day of surgery.  Stop ANY OVER THE COUNTER supplements/vitamins 7 days prior to surgery (Fish Oil)  No Alcohol for 24 hours before or after surgery.  No Smoking including e-cigarettes for 24 hours prior to surgery.  No chewable tobacco products for at least 6 hours prior to surgery.  No nicotine patches on the day of surgery.  Do not use any "recreational" drugs for at least a week prior to your surgery.  Please be advised that the combination of cocaine and anesthesia may have negative outcomes, up to and including death. If you test  positive for cocaine, your surgery will be cancelled.  On the morning of surgery brush your teeth with toothpaste and water, you may rinse your mouth with mouthwash if you wish. Do not swallow any toothpaste or mouthwash.  Use CHG Soap as directed on instruction sheet.  Do not wear jewelry, make-up, hairpins, clips or nail polish.  Do not wear lotions, powders, or perfumes.   Do not shave body from the neck down 48 hours prior to surgery just in case you cut yourself which could leave a site for infection.  Also, freshly shaved skin may become irritated if using the CHG soap.  Contact lenses, hearing aids and dentures may not be worn into surgery.  Do not bring valuables to the hospital. [facility] is not responsible for any missing/lost belongings or valuables.    Notify your doctor if there is any change in your medical condition (cold, fever, infection).  Wear comfortable clothing (specific to your surgery type) to the hospital.  After surgery, you can help prevent lung complications by doing breathing exercises.  Take deep breaths and cough every 1-2 hours. Your doctor may order a device called an Incentive Spirometer to help you take deep breaths. When coughing or sneezing, hold a pillow firmly against your incision with both hands. This is called "splinting." Doing this helps protect your incision. It also decreases belly discomfort.  If you are being admitted to the hospital overnight, leave your suitcase in the car. After surgery it may be brought to your room.  In  case of increased patient census, it may be necessary for you, the patient, to continue your postoperative care within the Same Day Surgery department.  If you are being discharged the day of surgery, you will not be allowed to drive home. You will need a responsible individual to drive you home and stay with you for 24 hours after surgery.   If you are taking public transportation, you will need to have a  responsible adult (18 years or older) with you. Please confirm with your physician that it is acceptable to use public transportation.   Please call the [place] Dept. at [phone] if you have any questions about these instructions.  Surgery Visitation Policy:  Patients undergoing a surgery or procedure may have two family members or support persons with them as long as the person is not COVID-19 positive or experiencing its symptoms.   Due to an increase in RSV and influenza rates and associated hospitalizations, children ages 38 and under will not be able to visit patients in [facility]. Masks continue to be strongly recommended.

## 2022-07-09 NOTE — Progress Notes (Signed)
Please find out if patient has completed her course of macrobid.  If she did, please have her come back and repeat her urine sample to make sure it has cleared.

## 2022-07-09 NOTE — Progress Notes (Signed)
Please tell patient she needs to take the medication.  It is okay to take with chronic kidney disease.  If she doesn't take the medication she won't be able to have her surgery.

## 2022-07-16 MED ORDER — CHLORHEXIDINE GLUCONATE 0.12 % MT SOLN: 15.0000 mL | Freq: Once | OROMUCOSAL | Status: AC

## 2022-07-16 MED ORDER — BUPIVACAINE LIPOSOME 1.3 % IJ SUSP: 20.0000 mL | Freq: Once | INTRAMUSCULAR | Status: DC

## 2022-07-16 MED ORDER — FAMOTIDINE 20 MG PO TABS: 20.0000 mg | ORAL_TABLET | Freq: Once | ORAL | Status: AC

## 2022-07-16 MED ORDER — CEFAZOLIN SODIUM-DEXTROSE 2-4 GM/100ML-% IV SOLN
2.0000 g | INTRAVENOUS | Status: AC
  Administered 2022-07-17: 2 g via INTRAVENOUS

## 2022-07-16 MED ORDER — CHLORHEXIDINE GLUCONATE CLOTH 2 % EX PADS: 6.0000 | MEDICATED_PAD | Freq: Once | CUTANEOUS | Status: DC

## 2022-07-16 MED ORDER — SODIUM CHLORIDE 0.9 % IV SOLN: INTRAVENOUS | Status: DC

## 2022-07-16 MED ORDER — ACETAMINOPHEN 500 MG PO TABS: 1000.0000 mg | ORAL_TABLET | ORAL | Status: AC

## 2022-07-16 MED ORDER — GABAPENTIN 300 MG PO CAPS: 300.0000 mg | ORAL_CAPSULE | ORAL | Status: AC

## 2022-07-16 MED ORDER — CHLORHEXIDINE GLUCONATE CLOTH 2 % EX PADS
6.0000 | MEDICATED_PAD | Freq: Once | CUTANEOUS | Status: AC
  Administered 2022-07-17: 6 via TOPICAL

## 2022-07-16 MED ORDER — ORAL CARE MOUTH RINSE: 15.0000 mL | Freq: Once | OROMUCOSAL | Status: AC

## 2022-07-17 ENCOUNTER — Encounter
Admission: RE | Disposition: A | Discharge status: Home / Self Care | Payer: Self-pay | Source: Home / Self Care | Attending: Surgery

## 2022-07-17 ENCOUNTER — Encounter: Payer: Self-pay | Admitting: Surgery

## 2022-07-17 ENCOUNTER — Ambulatory Visit
Admission: RE | Admit: 2022-07-17 | Discharge: 2022-07-17 | Disposition: A | Discharge status: Home / Self Care | Payer: Medicare HMO | Source: Ambulatory Visit | Attending: Surgery | Admitting: Surgery

## 2022-07-17 ENCOUNTER — Ambulatory Visit: Payer: Medicare HMO | Admitting: Certified Registered"

## 2022-07-17 ENCOUNTER — Ambulatory Visit
Admission: RE | Admit: 2022-07-17 | Discharge: 2022-07-17 | Disposition: A | Discharge status: Home / Self Care | Payer: Medicare HMO | Attending: Surgery | Admitting: Surgery

## 2022-07-17 ENCOUNTER — Other Ambulatory Visit: Payer: Self-pay

## 2022-07-17 DIAGNOSIS — N183 Chronic kidney disease, stage 3 unspecified: Secondary | ICD-10-CM | POA: Insufficient documentation

## 2022-07-17 DIAGNOSIS — E669 Obesity, unspecified: Secondary | ICD-10-CM | POA: Diagnosis not present

## 2022-07-17 DIAGNOSIS — D0512 Intraductal carcinoma in situ of left breast: Secondary | ICD-10-CM | POA: Diagnosis not present

## 2022-07-17 DIAGNOSIS — Z906 Acquired absence of other parts of urinary tract: Secondary | ICD-10-CM | POA: Diagnosis not present

## 2022-07-17 DIAGNOSIS — I129 Hypertensive chronic kidney disease with stage 1 through stage 4 chronic kidney disease, or unspecified chronic kidney disease: Secondary | ICD-10-CM | POA: Diagnosis not present

## 2022-07-17 DIAGNOSIS — Z87891 Personal history of nicotine dependence: Secondary | ICD-10-CM | POA: Insufficient documentation

## 2022-07-17 DIAGNOSIS — R928 Other abnormal and inconclusive findings on diagnostic imaging of breast: Secondary | ICD-10-CM | POA: Diagnosis not present

## 2022-07-17 DIAGNOSIS — Z6837 Body mass index (BMI) 37.0-37.9, adult: Secondary | ICD-10-CM | POA: Insufficient documentation

## 2022-07-17 DIAGNOSIS — Z8551 Personal history of malignant neoplasm of bladder: Secondary | ICD-10-CM | POA: Insufficient documentation

## 2022-07-17 DIAGNOSIS — N184 Chronic kidney disease, stage 4 (severe): Secondary | ICD-10-CM | POA: Diagnosis not present

## 2022-07-17 DIAGNOSIS — Z01818 Encounter for other preprocedural examination: Secondary | ICD-10-CM

## 2022-07-17 DIAGNOSIS — E1122 Type 2 diabetes mellitus with diabetic chronic kidney disease: Secondary | ICD-10-CM | POA: Diagnosis not present

## 2022-07-17 HISTORY — PX: BREAST LUMPECTOMY: SHX2

## 2022-07-17 HISTORY — PX: BREAST LUMPECTOMY WITH RADIOFREQUENCY TAG IDENTIFICATION: SHX6884

## 2022-07-17 LAB — GLUCOSE, CAPILLARY
Glucose-Capillary: 149 mg/dL — ABNORMAL HIGH (ref 70–99)
Glucose-Capillary: 153 mg/dL — ABNORMAL HIGH (ref 70–99)

## 2022-07-17 SURGERY — BREAST LUMPECTOMY WITH RADIOFREQUENCY TAG IDENTIFICATION
Anesthesia: General | Laterality: Left

## 2022-07-17 MED ORDER — CEFAZOLIN SODIUM-DEXTROSE 2-4 GM/100ML-% IV SOLN
INTRAVENOUS | Status: AC
  Filled 2022-07-17: qty 100

## 2022-07-17 MED ORDER — ACETAMINOPHEN 500 MG PO TABS: 1000.0000 mg | ORAL_TABLET | Freq: Four times a day (QID) | ORAL | Status: AC | PRN

## 2022-07-17 MED ORDER — BUPIVACAINE LIPOSOME 1.3 % IJ SUSP
INTRAMUSCULAR | Status: DC | PRN
  Administered 2022-07-17: 10 mL

## 2022-07-17 MED ORDER — SUGAMMADEX SODIUM 200 MG/2ML IV SOLN
INTRAVENOUS | Status: DC | PRN
  Administered 2022-07-17: 200 mg via INTRAVENOUS

## 2022-07-17 MED ORDER — ONDANSETRON HCL 4 MG/2ML IJ SOLN
INTRAMUSCULAR | Status: DC | PRN
  Administered 2022-07-17: 4 mg via INTRAVENOUS

## 2022-07-17 MED ORDER — DEXAMETHASONE SODIUM PHOSPHATE 10 MG/ML IJ SOLN
INTRAMUSCULAR | Status: DC | PRN
  Administered 2022-07-17: 5 mg via INTRAVENOUS

## 2022-07-17 MED ORDER — ONDANSETRON HCL 4 MG/2ML IJ SOLN
INTRAMUSCULAR | Status: AC
  Filled 2022-07-17: qty 2

## 2022-07-17 MED ORDER — LIDOCAINE HCL (PF) 2 % IJ SOLN
INTRAMUSCULAR | Status: AC
  Filled 2022-07-17: qty 5

## 2022-07-17 MED ORDER — BUPIVACAINE LIPOSOME 1.3 % IJ SUSP
INTRAMUSCULAR | Status: AC
  Filled 2022-07-17: qty 10

## 2022-07-17 MED ORDER — PROPOFOL 10 MG/ML IV BOLUS
INTRAVENOUS | Status: AC
  Filled 2022-07-17: qty 20

## 2022-07-17 MED ORDER — FENTANYL CITRATE (PF) 100 MCG/2ML IJ SOLN: 25.0000 ug | INTRAMUSCULAR | Status: DC | PRN

## 2022-07-17 MED ORDER — LIDOCAINE HCL (CARDIAC) PF 100 MG/5ML IV SOSY
PREFILLED_SYRINGE | INTRAVENOUS | Status: DC | PRN
  Administered 2022-07-17: 80 mg via INTRAVENOUS

## 2022-07-17 MED ORDER — PHENYLEPHRINE 80 MCG/ML (10ML) SYRINGE FOR IV PUSH (FOR BLOOD PRESSURE SUPPORT)
PREFILLED_SYRINGE | INTRAVENOUS | Status: AC
  Filled 2022-07-17: qty 10

## 2022-07-17 MED ORDER — EPINEPHRINE PF 1 MG/ML IJ SOLN
INTRAMUSCULAR | Status: AC
  Filled 2022-07-17: qty 1

## 2022-07-17 MED ORDER — FAMOTIDINE 20 MG PO TABS
ORAL_TABLET | ORAL | Status: AC
  Administered 2022-07-17: 20 mg via ORAL
  Filled 2022-07-17: qty 1

## 2022-07-17 MED ORDER — ROCURONIUM BROMIDE 10 MG/ML (PF) SYRINGE
PREFILLED_SYRINGE | INTRAVENOUS | Status: AC
  Filled 2022-07-17: qty 10

## 2022-07-17 MED ORDER — CHLORHEXIDINE GLUCONATE 0.12 % MT SOLN
OROMUCOSAL | Status: AC
  Administered 2022-07-17: 15 mL via OROMUCOSAL
  Filled 2022-07-17: qty 15

## 2022-07-17 MED ORDER — GABAPENTIN 300 MG PO CAPS
ORAL_CAPSULE | ORAL | Status: AC
  Administered 2022-07-17: 300 mg via ORAL
  Filled 2022-07-17: qty 1

## 2022-07-17 MED ORDER — OXYCODONE HCL 5 MG PO TABS
ORAL_TABLET | ORAL | Status: AC
  Filled 2022-07-17: qty 1

## 2022-07-17 MED ORDER — DEXAMETHASONE SODIUM PHOSPHATE 10 MG/ML IJ SOLN
INTRAMUSCULAR | Status: AC
  Filled 2022-07-17: qty 1

## 2022-07-17 MED ORDER — PROPOFOL 10 MG/ML IV BOLUS
INTRAVENOUS | Status: DC | PRN
  Administered 2022-07-17: 30 mg via INTRAVENOUS
  Administered 2022-07-17: 170 mg via INTRAVENOUS

## 2022-07-17 MED ORDER — PHENYLEPHRINE HCL (PRESSORS) 10 MG/ML IV SOLN
INTRAVENOUS | Status: DC | PRN
  Administered 2022-07-17 (×2): 120 ug via INTRAVENOUS
  Administered 2022-07-17: 80 ug via INTRAVENOUS

## 2022-07-17 MED ORDER — PHENYLEPHRINE HCL-NACL 20-0.9 MG/250ML-% IV SOLN
INTRAVENOUS | Status: DC | PRN
  Administered 2022-07-17: 28 ug/min via INTRAVENOUS

## 2022-07-17 MED ORDER — ROCURONIUM BROMIDE 100 MG/10ML IV SOLN
INTRAVENOUS | Status: DC | PRN
  Administered 2022-07-17: 60 mg via INTRAVENOUS

## 2022-07-17 MED ORDER — FENTANYL CITRATE (PF) 100 MCG/2ML IJ SOLN
INTRAMUSCULAR | Status: DC | PRN
  Administered 2022-07-17 (×2): 50 ug via INTRAVENOUS

## 2022-07-17 MED ORDER — OXYCODONE HCL 5 MG PO TABS
5.0000 mg | ORAL_TABLET | Freq: Once | ORAL | Status: AC | PRN
  Administered 2022-07-17: 5 mg via ORAL

## 2022-07-17 MED ORDER — STERILE WATER FOR IRRIGATION IR SOLN
Status: DC | PRN
  Administered 2022-07-17: 100 mL

## 2022-07-17 MED ORDER — BUPIVACAINE-EPINEPHRINE 0.5% -1:200000 IJ SOLN
INTRAMUSCULAR | Status: DC | PRN
  Administered 2022-07-17: 20 mL

## 2022-07-17 MED ORDER — OXYCODONE HCL 5 MG PO TABS: 5.0000 mg | ORAL_TABLET | ORAL | 0 refills | Status: DC | PRN

## 2022-07-17 MED ORDER — PHENYLEPHRINE HCL-NACL 20-0.9 MG/250ML-% IV SOLN
INTRAVENOUS | Status: AC
  Filled 2022-07-17: qty 250

## 2022-07-17 MED ORDER — ACETAMINOPHEN 500 MG PO TABS
ORAL_TABLET | ORAL | Status: AC
  Administered 2022-07-17: 1000 mg via ORAL
  Filled 2022-07-17: qty 2

## 2022-07-17 MED ORDER — OXYCODONE HCL 5 MG/5ML PO SOLN: 5.0000 mg | Freq: Once | ORAL | Status: AC | PRN

## 2022-07-17 MED ORDER — BUPIVACAINE HCL (PF) 0.5 % IJ SOLN
INTRAMUSCULAR | Status: AC
  Filled 2022-07-17: qty 30

## 2022-07-17 MED ORDER — FENTANYL CITRATE (PF) 100 MCG/2ML IJ SOLN
INTRAMUSCULAR | Status: AC
  Filled 2022-07-17: qty 2

## 2022-07-17 SURGICAL SUPPLY — 52 items
ADH SKN CLS APL DERMABOND .7 (GAUZE/BANDAGES/DRESSINGS) ×1
APL PRP STRL LF DISP 70% ISPRP (MISCELLANEOUS) ×1
APPLIER CLIP 9.375 SM OPEN (CLIP)
APR CLP SM 9.3 20 MLT OPN (CLIP)
BINDER BREAST LRG (GAUZE/BANDAGES/DRESSINGS) IMPLANT
BINDER BREAST MEDIUM (GAUZE/BANDAGES/DRESSINGS) IMPLANT
BLADE PHOTON ILLUMINATED (MISCELLANEOUS) ×1 IMPLANT
BLADE SURG 15 STRL LF DISP TIS (BLADE) ×2 IMPLANT
BLADE SURG 15 STRL SS (BLADE)
CHLORAPREP W/TINT 26 (MISCELLANEOUS) ×1 IMPLANT
CLIP APPLIE 9.375 SM OPEN (CLIP) IMPLANT
CNTNR URN SCR LID CUP LEK RST (MISCELLANEOUS) IMPLANT
CONT SPEC 4OZ STRL OR WHT (MISCELLANEOUS)
DERMABOND ADVANCED .7 DNX12 (GAUZE/BANDAGES/DRESSINGS) ×1 IMPLANT
DEVICE DUBIN SPECIMEN MAMMOGRA (MISCELLANEOUS) ×1 IMPLANT
DRAPE LAPAROTOMY 100X77 ABD (DRAPES) ×1 IMPLANT
DRSG GAUZE FLUFF 36X18 (GAUZE/BANDAGES/DRESSINGS) ×1 IMPLANT
ELECT REM PT RETURN 9FT ADLT (ELECTROSURGICAL) ×1
ELECTRODE REM PT RTRN 9FT ADLT (ELECTROSURGICAL) ×1 IMPLANT
GAUZE 4X4 16PLY ~~LOC~~+RFID DBL (SPONGE) ×1 IMPLANT
GLOVE SURG SYN 7.0 (GLOVE) ×1 IMPLANT
GLOVE SURG SYN 7.0 PF PI (GLOVE) ×1 IMPLANT
GLOVE SURG SYN 7.5  E (GLOVE) ×1
GLOVE SURG SYN 7.5 E (GLOVE) ×1 IMPLANT
GLOVE SURG SYN 7.5 PF PI (GLOVE) ×1 IMPLANT
GOWN STRL REUS W/ TWL LRG LVL3 (GOWN DISPOSABLE) ×2 IMPLANT
GOWN STRL REUS W/TWL LRG LVL3 (GOWN DISPOSABLE) ×2
KIT MARKER MARGIN INK (KITS) IMPLANT
KIT TURNOVER KIT A (KITS) ×1 IMPLANT
LABEL OR SOLS (LABEL) ×1 IMPLANT
MANIFOLD NEPTUNE II (INSTRUMENTS) ×1 IMPLANT
MARGIN MAP 10MM (MISCELLANEOUS) IMPLANT
MARKER MARGIN CORRECT CLIP (MARKER) IMPLANT
NEEDLE HYPO 22GX1.5 SAFETY (NEEDLE) ×1 IMPLANT
PACK BASIN MINOR ARMC (MISCELLANEOUS) ×1 IMPLANT
SET LOCALIZER 20 PROBE US (MISCELLANEOUS) ×1 IMPLANT
SUT ETHILON 3-0 FS-10 30 BLK (SUTURE)
SUT MNCRL 4-0 (SUTURE) ×1
SUT MNCRL 4-0 27XMFL (SUTURE) ×1
SUT SILK 3 0 SH 30 (SUTURE) ×1 IMPLANT
SUT VIC AB 2-0 SH 27 (SUTURE) ×2
SUT VIC AB 2-0 SH 27XBRD (SUTURE) IMPLANT
SUT VIC AB 3-0 SH 27 (SUTURE) ×2
SUT VIC AB 3-0 SH 27X BRD (SUTURE) ×1 IMPLANT
SUTURE EHLN 3-0 FS-10 30 BLK (SUTURE) IMPLANT
SUTURE MNCRL 4-0 27XMF (SUTURE) ×1 IMPLANT
SYR 10ML LL (SYRINGE) ×1 IMPLANT
TAPE TRANSPORE STRL 2 31045 (GAUZE/BANDAGES/DRESSINGS) IMPLANT
TRAP FLUID SMOKE EVACUATOR (MISCELLANEOUS) ×1 IMPLANT
TRAP NEPTUNE SPECIMEN COLLECT (MISCELLANEOUS) ×1 IMPLANT
WATER STERILE IRR 1000ML POUR (IV SOLUTION) ×1 IMPLANT
WATER STERILE IRR 500ML POUR (IV SOLUTION) ×1 IMPLANT

## 2022-07-17 NOTE — Transfer of Care (Signed)
Immediate Anesthesia Transfer of Care Note  Patient: Angela Russell  Procedure(s) Performed: BREAST LUMPECTOMY WITH RADIOFREQUENCY TAG IDENTIFICATION (Left)  Patient Location: PACU  Anesthesia Type:General  Level of Consciousness: drowsy  Airway & Oxygen Therapy: Patient Spontanous Breathing and Patient connected to face mask oxygen  Post-op Assessment: Report given to RN and Post -op Vital signs reviewed and stable  Post vital signs: stable  Last Vitals:  Vitals Value Taken Time  BP 150/66 07/17/22 0922  Temp    Pulse 85 07/17/22 0925  Resp 26 07/17/22 0925  SpO2 100 % 07/17/22 0925  Vitals shown include unvalidated device data.  Last Pain:  Vitals:   07/17/22 0628  TempSrc: Temporal  PainSc: 0-No pain         Complications: No notable events documented.

## 2022-07-17 NOTE — Discharge Instructions (Addendum)
Discharge Instructions: 1.  Patient may shower, but do not scrub wounds heavily and dab dry only. 2.  Do not submerge wounds in pool/tub until fully healed. 3.  Do not apply ointments or hydrogen peroxide to the wounds. 4.  May apply ice packs to the wounds for comfort. 5.  Please wear breast binder at all times for the next two weeks.  May only remove for showers.    AMBULATORY SURGERY  DISCHARGE INSTRUCTIONS   The drugs that you were given will stay in your system until tomorrow so for the next 24 hours you should not:  Drive an automobile Make any legal decisions Drink any alcoholic beverage   You may resume regular meals tomorrow.  Today it is better to start with liquids and gradually work up to solid foods.  You may eat anything you prefer, but it is better to start with liquids, then soup and crackers, and gradually work up to solid foods.   Please notify your doctor immediately if you have any unusual bleeding, trouble breathing, redness and pain at the surgery site, drainage, fever, or pain not relieved by medication.    Additional Instructions: PLEASE LEAVE GREEN/TEAL BAND ON FOR 4 DAYS      Please contact your physician with any problems or Same Day Surgery at [phone], Monday through Friday 6 am to 4 pm, or [place] at [facility] number at [phone].

## 2022-07-17 NOTE — Anesthesia Procedure Notes (Signed)
Procedure Name: Intubation Date/Time: 07/17/2022 7:33 AM  Performed by: Natasha Mead, CRNAPre-anesthesia Checklist: Patient identified, Emergency Drugs available, Suction available and Patient being monitored Patient Re-evaluated:Patient Re-evaluated prior to induction Oxygen Delivery Method: Circle system utilized Preoxygenation: Pre-oxygenation with 100% oxygen Induction Type: IV induction Ventilation: Mask ventilation without difficulty Laryngoscope Size: Miller and 2 Grade View: Grade I Tube type: Oral Tube size: 7.0 mm Number of attempts: 1 Airway Equipment and Method: Stylet and Oral airway Placement Confirmation: ETT inserted through vocal cords under direct vision, positive ETCO2 and breath sounds checked- equal and bilateral Secured at: 20 cm Tube secured with: Tape Dental Injury: Teeth and Oropharynx as per pre-operative assessment

## 2022-07-17 NOTE — Interval H&P Note (Signed)
History and Physical Interval Note:  07/17/2022 7:10 AM  Angela Russell  has presented today for surgery, with the diagnosis of Left breast DCIS.  The various methods of treatment have been discussed with the patient and family. After consideration of risks, benefits and other options for treatment, the patient has consented to  Procedure(s): BREAST LUMPECTOMY WITH RADIOFREQUENCY TAG IDENTIFICATION (Left) as a surgical intervention.  The patient's history has been reviewed, patient examined, no change in status, stable for surgery.  I have reviewed the patient's chart and labs.  Questions were answered to the patient's satisfaction.     Juelle Dickmann

## 2022-07-17 NOTE — Anesthesia Preprocedure Evaluation (Addendum)
Anesthesia Evaluation  Patient identified by MRN, date of birth, ID band Patient awake    Reviewed: Allergy & Precautions, NPO status , Patient's Chart, lab work & pertinent test results  History of Anesthesia Complications Negative for: history of anesthetic complications  Airway Mallampati: III  TM Distance: >3 FB Neck ROM: full    Dental  (+) Dental Advidsory Given, Edentulous Upper, Edentulous Lower   Pulmonary neg COPD, former smoker   Pulmonary exam normal breath sounds clear to auscultation       Cardiovascular hypertension, (-) Past MI and (-) CABG Normal cardiovascular exam Rhythm:Regular Rate:Normal     Neuro/Psych negative neurological ROS  negative psych ROS   GI/Hepatic negative GI ROS, Neg liver ROS,,,  Endo/Other  diabetes, Type 2  Obesity   Renal/GU CRFRenal disease   Urothelial cancer    Musculoskeletal   Abdominal   Peds  Hematology negative hematology ROS (+)   Anesthesia Other Findings Past Medical History: No date: Bladder carcinoma ([place]) 01/2014: Carotid artery plaque, right No date: CKD (chronic kidney disease)     Comment:  stage 4-followed by nephrology No date: Controlled diabetes mellitus type 2 with complications ([place]) No date: Diabetes mellitus without complication (HCC) No date: DM (diabetes mellitus), type 2, uncontrolled No date: Ductal carcinoma in situ (DCIS) of breast No date: DVT (deep venous thrombosis) ([place]) No date: Hyperlipidemia No date: Hypertension No date: Hypochromic microcytic anemia     Comment:  mild 03/23/2019: Metastatic urothelial carcinoma (HCC) No date: Osteoporosis No date: Parastomal hernia No date: Personal history of colonic polyps No date: Port-A-Cath in place     Comment:  right side No date: Renal insufficiency No date: Rotator cuff tendonitis, right No date: Sepsis ([place]) 07/03/2022: UTI (urinary tract infection)  Past Surgical  History: 06/13/2022: BREAST BIOPSY; Left     Comment:  stereo biopsy/x clip/ path pending 06/13/2022: BREAST BIOPSY; Left     Comment:  stereo biopsy/ ribbon clip/ path pending 06/13/2022: BREAST BIOPSY; Left     Comment:  MM LT BREAST BX W LOC DEV EA AD LESION IMG BX SPEC               STEREO GUIDE 06/13/2022 [facility] 06/13/2022: BREAST BIOPSY; Left     Comment:  MM LT BREAST BX W LOC DEV 1ST LESION IMAGE BX SPEC               STEREO GUIDE 06/13/2022 [facility] 03/17/2021: COLONOSCOPY WITH PROPOFOL; N/A     Comment:  Procedure: COLONOSCOPY WITH PROPOFOL;  Surgeon: Jonathon Bellows, MD;  Location: [facility] ENDOSCOPY;  Service:               Gastroenterology;  Laterality: N/A; 07/16/2018: CYSTOSCOPY W/ RETROGRADES; Bilateral     Comment:  Procedure: CYSTOSCOPY WITH RETROGRADE PYELOGRAM;                Surgeon: Billey Co, MD;  Location: [facility] ORS;                Service: Urology;  Laterality: Bilateral; 03/17/2021: ESOPHAGOGASTRODUODENOSCOPY     Comment:  Procedure: ESOPHAGOGASTRODUODENOSCOPY (EGD);  Surgeon:               Jonathon Bellows, MD;  Location: [facility] ENDOSCOPY;  Service:               Gastroenterology;; 06/19/2021: Freda [place]ro CAPSULE STUDY; N/A     Comment:  Procedure:  GIVENS CAPSULE STUDY;  Surgeon: Jonathon Bellows,               MD;  Location: [facility] ENDOSCOPY;  Service:               Gastroenterology;  Laterality: N/A; 03/30/2019: IVC FILTER INSERTION; N/A     Comment:  Procedure: IVC FILTER INSERTION;  Surgeon: Algernon Huxley,              MD;  Location: [place] CV LAB;  Service:               Cardiovascular;  Laterality: N/A; 06/15/2019: IVC FILTER REMOVAL; N/A     Comment:  Procedure: IVC FILTER REMOVAL;  Surgeon: Algernon Huxley,               MD;  Location: [place] CV LAB;  Service:               Cardiovascular;  Laterality: N/A; 03/17/2013: KNEE SURGERY; Left     Comment:  torn meniscus 03/30/2019: PERIPHERAL VASCULAR THROMBECTOMY; Right      Comment:  Procedure: PERIPHERAL VASCULAR THROMBECTOMY;  Surgeon:               Algernon Huxley, MD;  Location: [place] CV LAB;                Service: Cardiovascular;  Laterality: Right; 08/06/2018: PORTA CATH INSERTION; N/A     Comment:  Procedure: PORTA CATH INSERTION;  Surgeon: Algernon Huxley,              MD;  Location: [place] CV LAB;  Service:               Cardiovascular;  Laterality: N/A; 2000: TOTAL ABDOMINAL HYSTERECTOMY     Comment:  due to bleeding and fibroids, partial- still has ovaries 07/16/2018: TRANSURETHRAL RESECTION OF BLADDER TUMOR; N/A     Comment:  Procedure: TRANSURETHRAL RESECTION OF BLADDER TUMOR               (TURBT);  Surgeon: Billey Co, MD;  Location: [facility]               ORS;  Service: Urology;  Laterality: N/A;  BMI    Body Mass Index: 37.20 kg/m      Reproductive/Obstetrics negative OB ROS                              Anesthesia Physical Anesthesia Plan  ASA: 2  Anesthesia Plan: General ETT   Post-op Pain Management:    Induction: Intravenous  PONV Risk Score and Plan: 3 and Ondansetron, Dexamethasone and Midazolam  Airway Management Planned: Oral ETT  Additional Equipment:   Intra-op Plan:   Post-operative Plan: Extubation in OR  Informed Consent: I have reviewed the patients History and Physical, chart, labs and discussed the procedure including the risks, benefits and alternatives for the proposed anesthesia with the patient or authorized representative who has indicated his/her understanding and acceptance.     Dental Advisory Given  Plan Discussed with: Anesthesiologist, CRNA and Surgeon  Anesthesia Plan Comments: (Patient consented for risks of anesthesia including but not limited to:  - adverse reactions to medications - damage to eyes, teeth, lips or other oral mucosa - nerve damage due to positioning  - sore throat or hoarseness - Damage to heart, brain, nerves, lungs, other parts of  body or loss of life  Patient voiced understanding.)         Anesthesia Quick Evaluation

## 2022-07-17 NOTE — Op Note (Signed)
  Procedure Date:  07/17/2022  Pre-operative Diagnosis:  Left breast DCIS  Post-operative Diagnosis: Left breast DCIS  Procedure:  Left breast RF tag-localized lumpectomy  Surgeon:  Melvyn Neth, MD  Anesthesia:  General endotracheal  Estimated Blood Loss:  10 ml  Specimens:   Left breast DCIS Left breast new medial/superior margin  Complications:  None  Indications for Procedure:  This is a 72 y.o. female who presents with left breast DCIS.  The risks of bleeding, infection, injury to surrounding structures, hematoma, seroma, open wound, cosmetic deformity, and the need for further surgery were all discussed with the patient and was willing to proceed.  Prior to this procedure, the patient had undergone RF tag localization.  Description of Procedure: The patient was correctly identified in the preoperative area and brought into the operating room.  The patient was placed supine with VTE prophylaxis in place.  Appropriate time-outs were performed.  Anesthesia was induced and the patient was intubated.  Appropriate antibiotics were infused.  The left chest was prepped and draped in usual sterile fashion.  The RF tag localization site was determined using the Hologic probe.  An incision was made overlying the tag and clip.  Hologic probe was used to guide our dissection using electrocautery, and a partial mastectomy was performed.  MarginMarker was used to ink each of the sides of the specimen.  The specimen was then imaged to confirm that the area of concern, biopsy clip, and RF tag were included in the excision.  This was then sent to pathology.  On gross analysis of the margins, there was an area of dense fibrosis in the medial/superior aspect of the initial specimen, and as a precaution, a new medial/superior margin was obtained.  This was also marked with MarginMarker and sent to pathology.  The cavity was irrigated and hemostasis was assured with electrocautery.  Local anesthetic was  infiltrated into the skin and subcutaneous tissue of the cavity.  The wound was then closed in three layers with 2-0 Vicryl, 3-0 Vicryl and 4-0 Monocryl and sealed with DermaBond.  The patient was emerged from anesthesia and extubated and brought to the recovery room for further management.  The patient tolerated the procedure well and all counts were correct at the end of the case.   Melvyn Neth, MD

## 2022-07-17 NOTE — Anesthesia Postprocedure Evaluation (Signed)
Anesthesia Post Note  Patient: Angela Russell  Procedure(s) Performed: BREAST LUMPECTOMY WITH RADIOFREQUENCY TAG IDENTIFICATION (Left)  Patient location during evaluation: PACU Anesthesia Type: General Level of consciousness: awake and alert Pain management: pain level controlled Vital Signs Assessment: post-procedure vital signs reviewed and stable Respiratory status: spontaneous breathing, nonlabored ventilation, respiratory function stable and patient connected to nasal cannula oxygen Cardiovascular status: blood pressure returned to baseline and stable Postop Assessment: no apparent nausea or vomiting Anesthetic complications: no  No notable events documented.   Last Vitals:  Vitals:   07/17/22 0945 07/17/22 0957  BP: (!) 152/63 (!) 158/60  Pulse: 72 80  Resp: 15 16  Temp: (!) 36.2 C (!) 36.2 C  SpO2: 98% 98%    Last Pain:  Vitals:   07/17/22 0957  TempSrc: Temporal  PainSc: 0-No pain                 Dimas Millin

## 2022-07-18 ENCOUNTER — Encounter: Payer: Self-pay | Admitting: Surgery

## 2022-07-19 ENCOUNTER — Other Ambulatory Visit: Payer: Self-pay | Admitting: Pathology

## 2022-07-19 LAB — SURGICAL PATHOLOGY

## 2022-07-30 ENCOUNTER — Inpatient Hospital Stay: Payer: Medicare HMO | Attending: Oncology

## 2022-07-31 ENCOUNTER — Inpatient Hospital Stay: Payer: Medicare HMO | Admitting: Oncology

## 2022-08-01 ENCOUNTER — Encounter: Payer: Self-pay | Admitting: Surgery

## 2022-08-01 ENCOUNTER — Ambulatory Visit (INDEPENDENT_AMBULATORY_CARE_PROVIDER_SITE_OTHER): Payer: Medicare HMO | Admitting: Surgery

## 2022-08-01 VITALS — BP 158/81 | HR 97 | Temp 98.7°F | Ht 63.0 in | Wt 213.6 lb

## 2022-08-01 DIAGNOSIS — Z09 Encounter for follow-up examination after completed treatment for conditions other than malignant neoplasm: Secondary | ICD-10-CM

## 2022-08-01 DIAGNOSIS — D0512 Intraductal carcinoma in situ of left breast: Secondary | ICD-10-CM

## 2022-08-01 NOTE — Patient Instructions (Addendum)
wE will contact you in six months to schedule follow up exam.     referral has been placed to Dr. Donella Stade (Radiation Oncology). They will call you for an appointment.   If you have any concerns or questions, please feel free to call our office.   Lumpectomy, Care After The following information offers guidance on how to care for yourself after your procedure. Your health care provider may also give you more specific instructions. If you have problems or questions, contact your health care provider. What can I expect after the procedure? After the procedure, it is common to have: Some pain or redness at the incision site. Breast swelling. Breast tenderness. Stiffness in your arm or shoulder. A change in the shape and feel of your breast. Scar tissue that feels hard to the touch in the area where the lump was removed. Follow these instructions at home: Medicines Take over-the-counter and prescription medicines only as told by your health care provider. If you were prescribed an antibiotic, take it as told by your health care provider. Do not stop taking the antibiotic even if you start to feel better. Ask your health care provider if the medicine prescribed to you: Requires you to avoid driving or using machinery. Can cause constipation. You may need to take these actions to prevent or treat constipation: Drink enough fluid to keep your urine pale yellow. Take over-the-counter or prescription medicines. Eat foods that are high in fiber, such as beans, whole grains, and fresh fruits and vegetables. Limit foods that are high in fat and processed sugars, such as fried or sweet foods. Incision care     Follow instructions from your health care provider about how to take care of your incision. Make sure you: Wash your hands with soap and water for at least 20 seconds before and after you change your bandage (dressing). If soap and water are not available, use hand sanitizer. Change your  dressing as told by your health care provider. Leave stitches (sutures), skin glue, or adhesive strips in place. These skin closures may need to stay in place for 2 weeks or longer. If adhesive strip edges start to loosen and curl up, you may trim the loose edges. Do not remove adhesive strips completely unless your health care provider tells you to do that. Check your incision area every day for signs of infection. Check for: More redness, swelling, or pain. Fluid or blood. Warmth. Pus or a bad smell. Keep your dressing clean and dry. If you were sent home with a surgical drain in place, follow instructions from your health care provider about emptying it. Bathing Do not take baths, swim, or use a hot tub until your health care provider approves. Ask your health care provider if you may take showers. You may only be allowed to take sponge baths. Activity Rest as told by your health care provider. Do not sit for a long time without moving. Get up to take short walks every 1-2 hours. This will improve blood flow and breathing. Ask for help if you feel weak or unsteady. Be careful to avoid any activities that could cause an injury to your arm on the side of your surgery. Do not lift anything that is heavier than 10 lb (4.5 kg), or the limit that you are told, until your health care provider says that it is safe. Avoid lifting with the arm that is on the side of your surgery. Do not carry heavy objects on your shoulder on  the side of your surgery. Do exercises to keep your shoulder and arm from getting stiff and swollen. Talk with your health care provider about which exercises are safe for you. Return to your normal activities as told by your health care provider. Ask your health care provider what activities are safe for you. General instructions Wear a supportive bra as told by your health care provider. Raise (elevate) your arm above the level of your heart while you are sitting or lying  down. Do not wear tight jewelry on your arm, wrist, or fingers on the side of your surgery. Wear compression stockings as told by your health care provider. These stockings help to prevent blood clots and reduce swelling in your legs. If you had any lymph nodes removed during your procedure, be sure to tell all of your health care providers. It is important to share this information before you have certain procedures, such as blood tests or blood pressure measurements. Keep all follow-up visits. You may need to be screened for extra fluid around the lymph nodes and swelling in the breast and arm (lymphedema). Contact a health care provider if: You develop a rash. You have a fever. Your pain worsens or pain medicine is not working. You have swelling, weakness, or numbness in your arm that does not improve after a few weeks. You have new swelling in your breast. You have any of these signs of infection: More redness, swelling, or pain in your incision area. Fluid or blood coming from your incision. Warmth coming from the incision area. Pus or a bad smell coming from your incision. Get help right away if: You have very bad pain in your breast or arm. You have swelling in your legs or arms. You have redness, warmth, or pain in your leg or arm. You have chest pain. You have difficulty breathing. These symptoms may be an emergency. Get help right away. Call 911. Do not wait to see if the symptoms will go away. Do not drive yourself to the hospital. Summary After the procedure, it is common to have breast tenderness, swelling in your breast, and stiffness in your arm and shoulder. Follow instructions from your health care provider about how to take care of your incision. Do not lift anything that is heavier than 10 lb (4.5 kg), or the limit that you are told, until your health care provider says that it is safe. Avoid lifting with the arm that is on the side of your surgery. If you had any  lymph nodes removed during your procedure, be sure to tell all of your health care providers. This information is not intended to replace advice given to you by your health care provider. Make sure you discuss any questions you have with your health care provider. Document Revised: 07/30/2021 Document Reviewed: 07/30/2021 Elsevier Patient Education  [place].

## 2022-08-01 NOTE — Progress Notes (Signed)
08/01/2022  HPI: Angela Russell is a 72 y.o. female s/p left breast RF tag localized lumpectomy for DCIS on 07/17/22.  Patient presents for follow up.  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  The margins were overall negative for the posterior margin was < 1 mm.  She reports she's doing well and denies any worsening pain.  Reports some soreness but otherwise no issues with the incision.  Vital signs: BP (!) 158/81   Pulse 97   Temp 98.7 F (37.1 C) (Oral)   Ht '5\' 3"'$  (1.6 m)   Wt 213 lb 9.6 oz (96.9 kg)   SpO2 99%   BMI 37.84 kg/m    Physical Exam: Constitutional: No acute distress Breast:  Left breast lumpectomy site at lower outer quadrant is healing well, without any evidence of infection.  DermaBond is partially peeled.  Palpable firmness deep to the incision consistent with scar tissue.  Assessment/Plan: This is a 72 y.o. female s/p left breast RF tag localized lumpectomy  --Discussed with her again the findings from the pathology results.  Had already discussed with her the findings over the phone and discussed the posterior margin being very close and the recommendation for re-excision of that margin.  However, she declined further surgery and today she confirms that decision after discussing again with her. --For now, will send referral to Dr. Baruch Gouty for radiation therapy.  The patient is debating whether to do it or not, but I would recommend at least because of the close margin. --Follow up in 6 months.   Melvyn Neth, [facility]

## 2022-08-07 ENCOUNTER — Inpatient Hospital Stay: Payer: Medicare HMO

## 2022-08-09 ENCOUNTER — Encounter: Payer: Self-pay | Admitting: Oncology

## 2022-08-14 ENCOUNTER — Encounter: Payer: Self-pay | Admitting: Radiation Oncology

## 2022-08-14 ENCOUNTER — Inpatient Hospital Stay: Payer: Medicare HMO | Attending: Oncology

## 2022-08-14 ENCOUNTER — Ambulatory Visit
Admission: RE | Admit: 2022-08-14 | Discharge: 2022-08-14 | Disposition: A | Discharge status: Home / Self Care | Payer: Medicare HMO | Source: Ambulatory Visit | Attending: Radiation Oncology | Admitting: Radiation Oncology

## 2022-08-14 VITALS — BP 160/85 | HR 89 | Temp 97.1°F | Resp 12 | Ht 63.5 in | Wt 217.0 lb

## 2022-08-14 DIAGNOSIS — E1165 Type 2 diabetes mellitus with hyperglycemia: Secondary | ICD-10-CM | POA: Insufficient documentation

## 2022-08-14 DIAGNOSIS — D0512 Intraductal carcinoma in situ of left breast: Secondary | ICD-10-CM | POA: Insufficient documentation

## 2022-08-14 DIAGNOSIS — Z86718 Personal history of other venous thrombosis and embolism: Secondary | ICD-10-CM | POA: Insufficient documentation

## 2022-08-14 DIAGNOSIS — N189 Chronic kidney disease, unspecified: Secondary | ICD-10-CM | POA: Diagnosis not present

## 2022-08-14 DIAGNOSIS — Z79899 Other long term (current) drug therapy: Secondary | ICD-10-CM | POA: Diagnosis not present

## 2022-08-14 DIAGNOSIS — M810 Age-related osteoporosis without current pathological fracture: Secondary | ICD-10-CM | POA: Insufficient documentation

## 2022-08-14 DIAGNOSIS — C679 Malignant neoplasm of bladder, unspecified: Secondary | ICD-10-CM | POA: Insufficient documentation

## 2022-08-14 DIAGNOSIS — E785 Hyperlipidemia, unspecified: Secondary | ICD-10-CM | POA: Insufficient documentation

## 2022-08-14 DIAGNOSIS — Z87891 Personal history of nicotine dependence: Secondary | ICD-10-CM | POA: Diagnosis not present

## 2022-08-14 DIAGNOSIS — Z8551 Personal history of malignant neoplasm of bladder: Secondary | ICD-10-CM | POA: Diagnosis not present

## 2022-08-14 DIAGNOSIS — Z95828 Presence of other vascular implants and grafts: Secondary | ICD-10-CM

## 2022-08-14 DIAGNOSIS — Z86010 Personal history of colonic polyps: Secondary | ICD-10-CM | POA: Insufficient documentation

## 2022-08-14 DIAGNOSIS — Z171 Estrogen receptor negative status [ER-]: Secondary | ICD-10-CM | POA: Diagnosis not present

## 2022-08-14 MED ORDER — HEPARIN SOD (PORK) LOCK FLUSH 100 UNIT/ML IV SOLN
500.0000 [IU] | Freq: Once | INTRAVENOUS | Status: AC
  Administered 2022-08-14: 500 [IU] via INTRAVENOUS
  Filled 2022-08-14: qty 5

## 2022-08-14 MED ORDER — SODIUM CHLORIDE 0.9% FLUSH
10.0000 mL | Freq: Once | INTRAVENOUS | Status: AC
  Administered 2022-08-14: 10 mL via INTRAVENOUS
  Filled 2022-08-14: qty 10

## 2022-08-14 NOTE — Consult Note (Signed)
NEW PATIENT EVALUATION  Name: Angela Russell  MRN: [id number]  Date:   08/14/2022     DOB: 1950-09-24   This 72 y.o. female patient presents to the clinic for initial evaluation of stage 0 (Tis N0 M0).  ER negative  REFERRING PHYSICIAN: [name], NP  CHIEF COMPLAINT:  Chief Complaint  Patient presents with   DCIS    DIAGNOSIS: The encounter diagnosis was Ductal carcinoma in situ (DCIS) of left breast.   PREVIOUS INVESTIGATIONS:  Mammogram and ultrasound reviewed Clinical notes reviewed Pathology report reviewed  HPI: Patient is a 72 year old female who presented with an abnormal mammogram of her left breast.  There were calcifications that warranted further evaluation.  There were 2 groups of indeterminate calcifications left breast 1 in the upper outer quadrant spanning 1.3 cm 1 in the central inferior left breast spanning 1.2.  Both areas were biopsied and the left breast medial area showed ductal carcinoma in situ nuclear score of 3 with focal comedonecrosis.  She underwent a wide local excision for at least 2 cm area of grade 3 ductal carcinoma in situ.  Margins were clear but less than 1 mm.  No regional lymph nodes were submitted.  Tumor was ER negative.  She tolerated her surgery well is now referred to radiation oncology for consideration of treatment.  She specifically denies breast tenderness cough or bone pain.  PLANNED TREATMENT REGIMEN: Left hypofractionated whole breast radiation  PAST MEDICAL HISTORY:  has a past medical history of Bladder carcinoma ([employer]), Carotid artery plaque, right (01/2014), CKD (chronic kidney disease), Controlled diabetes mellitus type 2 with complications ([place]), Diabetes mellitus without complication ([place]), DM (diabetes mellitus), type 2, uncontrolled, Ductal carcinoma in situ (DCIS) of breast, DVT (deep venous thrombosis) ([place]), Hyperlipidemia, Hypertension, Hypochromic microcytic anemia, Metastatic urothelial carcinoma ([place])  (03/23/2019), Osteoporosis, Parastomal hernia, Personal history of colonic polyps, Port-A-Cath in place, Renal insufficiency, Rotator cuff tendonitis, right, Sepsis ([place]), and UTI (urinary tract infection) (07/03/2022).    PAST SURGICAL HISTORY:  Past Surgical History:  Procedure Laterality Date   BREAST BIOPSY Left 06/13/2022   stereo biopsy/x clip/ path pending   BREAST BIOPSY Left 06/13/2022   stereo biopsy/ ribbon clip/ path pending   BREAST BIOPSY Left 06/13/2022   MM LT BREAST BX W LOC DEV EA AD LESION IMG BX SPEC STEREO GUIDE 06/13/2022 [facility]   BREAST BIOPSY Left 06/13/2022   MM LT BREAST BX W LOC DEV 1ST LESION IMAGE BX SPEC STEREO GUIDE 06/13/2022 [facility]   BREAST LUMPECTOMY WITH RADIOFREQUENCY TAG IDENTIFICATION Left [id number]   Procedure: BREAST LUMPECTOMY WITH RADIOFREQUENCY TAG IDENTIFICATION;  Surgeon: Olean Ree, MD;  Location: [facility];  Service: General;  Laterality: Left;   COLONOSCOPY WITH PROPOFOL N/A 03/17/2021   Procedure: COLONOSCOPY WITH PROPOFOL;  Surgeon: Jonathon Bellows, MD;  Location: [facility] END[facility]COPY;  Service: Gastroenterology;  Laterality: N/A;   CYSTOSCOPY W/ RETROGRADES Bilateral 07/16/2018   Procedure: CYSTOSCOPY WITH RETROGRADE PYELOGRAM;  Surgeon: Billey Co, MD;  Location: [facility];  Service: Urology;  Laterality: Bilateral;   ESOPHAGOGASTRODUODENOSCOPY  03/17/2021   Procedure: ESOPHAGOGASTRODUODENOSCOPY (EGD);  Surgeon: Jonathon Bellows, MD;  Location: [facility] ENDOSCOPY;  Service: Gastroenterology;;   GIVENS CAPSULE STUDY N/A 06/19/2021   Procedure: GIVENS CAPSULE STUDY;  Surgeon: Jonathon Bellows, MD;  Location: [facility] ENDOSCOPY;  Service: Gastroenterology;  Laterality: N/A;   IVC FILTER INSERTION N/A 03/30/2019   Procedure: IVC FILTER INSERTION;  Surgeon: Algernon Huxley, MD;  Location: [place] [facility];  Service: Cardiovascular;  Laterality: N/A;  IVC FILTER REMOVAL N/A 06/15/2019   Procedure: IVC FILTER REMOVAL;  Surgeon: Algernon Huxley, MD;   Location: [place] [facility];  Service: Cardiovascular;  Laterality: N/A;   KNEE SURGERY Left 03/17/2013   torn meniscus   PERIPHERAL VASCULAR THROMBECTOMY Right 03/30/2019   Procedure: PERIPHERAL VASCULAR THROMBECTOMY;  Surgeon: Algernon Huxley, MD;  Location: [place] [facility];  Service: Cardiovascular;  Laterality: Right;   PORTA CATH INSERTION N/A 08/06/2018   Procedure: PORTA CATH INSERTION;  Surgeon: Algernon Huxley, MD;  Location: [place] [facility];  Service: Cardiovascular;  Laterality: N/A;   TOTAL ABDOMINAL HYSTERECTOMY  2000   due to bleeding and fibroids, partial- still has ovaries   TRANSURETHRAL RESECTION OF BLADDER TUMOR N/A 07/16/2018   Procedure: TRANSURETHRAL RESECTION OF BLADDER TUMOR (TURBT);  Surgeon: Billey Co, MD;  Location: [facility];  Service: Urology;  Laterality: N/A;    FAMILY HISTORY: family history includes Arthritis in her father; Diabetes in her brother; Heart attack in her mother; Heart disease in her mother.  SOCIAL HISTORY:  reports that she quit smoking about 31 years ago. Her smoking use included cigarettes. She has a 10.00 pack-year smoking history. She has never used smokeless tobacco. She reports that she does not drink alcohol and does not use drugs.  ALLERGIES: Patient has no known allergies.  MEDICATIONS:  Current Outpatient Medications  Medication Sig Dispense Refill   acetaminophen (TYLENOL) 500 MG tablet Take 2 tablets (1,000 mg total) by mouth every 6 (six) hours as needed for mild pain.     amLODipine (NORVASC) 10 MG tablet Take 1 tablet by mouth once daily (Patient taking differently: Take 10 mg by mouth every morning.) 90 tablet 0   bisacodyl (DULCOLAX) 5 MG EC tablet Take 1 tablet (5 mg total) by mouth daily as needed for moderate constipation. 30 tablet 0   docusate sodium (COLACE) 100 MG capsule Take 1 capsule (100 mg total) by mouth daily. (Patient taking differently: Take 100 mg by mouth daily as needed.) 90 capsule 0   losartan  (COZAAR) 25 MG tablet Take 1 tablet (25 mg total) by mouth daily. (Patient taking differently: Take 25 mg by mouth at bedtime.) 90 tablet 1   Omega-3 Fatty Acids (FISH OIL PO) Take 1 tablet by mouth daily at 6 (six) AM.     polyethylene glycol (MIRALAX / GLYCOLAX) 17 g packet Take 17 g by mouth 2 (two) times daily. 14 each 0   Semaglutide (RYBELSUS) 3 MG TABS Take 1 tablet by mouth daily. (Patient taking differently: Take 1 tablet by mouth every morning.) 90 tablet 1   No current facility-administered medications for this encounter.   Facility-Administered Medications Ordered in Other Encounters  Medication Dose Route Frequency Provider Last Rate Last Admin   heparin lock flush 100 UNIT/ML injection             ECOG PERFORMANCE STATUS:  0 - Asymptomatic  REVIEW OF SYSTEMS: Patient has a history of bladder cancer status post cystectomy with ileal conduit Patient denies any weight loss, fatigue, weakness, fever, chills or night sweats. Patient denies any loss of vision, blurred vision. Patient denies any ringing  of the ears or hearing loss. No irregular heartbeat. Patient denies heart murmur or history of fainting. Patient denies any chest pain or pain radiating to her upper extremities. Patient denies any shortness of breath, difficulty breathing at night, cough or hemoptysis. Patient denies any swelling in the lower legs. Patient denies any nausea vomiting,  vomiting of blood, or coffee ground material in the vomitus. Patient denies any stomach pain. Patient states has had normal bowel movements no significant constipation or diarrhea. Patient denies any dysuria, hematuria or significant nocturia. Patient denies any problems walking, swelling in the joints or loss of balance. Patient denies any skin changes, loss of hair or loss of weight. Patient denies any excessive worrying or anxiety or significant depression. Patient denies any problems with insomnia. Patient denies excessive thirst, polyuria,  polydipsia. Patient denies any swollen glands, patient denies easy bruising or easy bleeding. Patient denies any recent infections, allergies or URI. Patient "s visual fields have not changed significantly in recent time.   PHYSICAL EXAM: BP (!) 160/85 (BP Location: Right Arm, Patient Position: Sitting, Cuff Size: Normal) Comment: patient states she has white coat syndrom and BP is usually withing normal limits  Pulse 89   Temp (!) 97.1 F (36.2 C) (Tympanic)   Resp 12   Ht 5' 3.5" (1.613 m)   Wt 217 lb (98.4 kg)   BMI 37.84 kg/m  She is to post wide local excision of the left breast incision is well-healed.  No dominant masses noted in either breast.  No axillary or supraclavicular adenopathy is appreciated.  Well-developed well-nourished patient in NAD. HEENT reveals PERLA, EOMI, discs not visualized.  Oral cavity is clear. No oral mucosal lesions are identified. Neck is clear without evidence of cervical or supraclavicular adenopathy. Lungs are clear to A&P. Cardiac examination is essentially unremarkable with regular rate and rhythm without murmur rub or thrill. Abdomen is benign with no organomegaly or masses noted. Motor sensory and DTR levels are equal and symmetric in the upper and lower extremities. Cranial nerves II through XII are grossly intact. Proprioception is intact. No peripheral adenopathy or edema is identified. No motor or sensory levels are noted. Crude visual fields are within normal range.  LABORATORY DATA: Pathology reports reviewed    RADIOLOGY RESULTS: Mammogram ultrasound and CT scan reviewed.  CT scan chest and pelvis showed no evidence of malignancy patient does have a prior history of cystectomy and ileal conduit.   IMPRESSION: ER negative ductal carcinoma in situ of the left breast status post wide local excision in 72 year old female  PLAN: Time I have recommended a hypofractionated course of whole breast radiation over 3 weeks.  Would also boost her scar another  1600 centigrade based on the close margin.  Risks and benefits of treatment coming skin reaction fatigue alteration blood counts possible inclusion of superficial lung all were discussed in detail with the patient.  We will attempt deep inspiration breath-hold to decrease the cardiac dose.  Patient comprehends my recommendations well I personally set up and ordered CT simulation.  I would like to take this opportunity to thank you for allowing me to participate in the care of your patient.Noreene Filbert, MD

## 2022-08-15 ENCOUNTER — Ambulatory Visit: Payer: Medicare HMO

## 2022-08-15 DIAGNOSIS — D0512 Intraductal carcinoma in situ of left breast: Secondary | ICD-10-CM | POA: Diagnosis not present

## 2022-08-15 DIAGNOSIS — Z171 Estrogen receptor negative status [ER-]: Secondary | ICD-10-CM | POA: Diagnosis not present

## 2022-08-21 ENCOUNTER — Ambulatory Visit
Admission: RE | Admit: 2022-08-21 | Discharge: 2022-08-21 | Disposition: A | Discharge status: Home / Self Care | Payer: Medicare HMO | Source: Ambulatory Visit | Attending: Radiation Oncology | Admitting: Radiation Oncology

## 2022-08-21 ENCOUNTER — Encounter: Payer: Self-pay | Admitting: *Deleted

## 2022-08-21 DIAGNOSIS — Z510 Encounter for antineoplastic radiation therapy: Secondary | ICD-10-CM | POA: Diagnosis not present

## 2022-08-21 DIAGNOSIS — D0512 Intraductal carcinoma in situ of left breast: Secondary | ICD-10-CM | POA: Diagnosis not present

## 2022-08-21 DIAGNOSIS — Z171 Estrogen receptor negative status [ER-]: Secondary | ICD-10-CM | POA: Diagnosis not present

## 2022-08-23 DIAGNOSIS — Z171 Estrogen receptor negative status [ER-]: Secondary | ICD-10-CM | POA: Diagnosis not present

## 2022-08-23 DIAGNOSIS — D0512 Intraductal carcinoma in situ of left breast: Secondary | ICD-10-CM | POA: Diagnosis not present

## 2022-08-23 DIAGNOSIS — Z510 Encounter for antineoplastic radiation therapy: Secondary | ICD-10-CM | POA: Diagnosis not present

## 2022-08-23 NOTE — Progress Notes (Signed)
BP 132/82   Pulse 93   Temp (!) 97.5 F (36.4 C) (Oral)   Wt 219 lb 4.8 oz (99.5 kg)   SpO2 95%   BMI 38.24 kg/m    Subjective:    Patient ID: Angela Russell, female    DOB: 1950/09/20, 72 y.o.   MRN: [id number]  HPI: Angela Russell is a 72 y.o. female  Chief Complaint  Patient presents with   Diabetes   Hypertension   HYPERTENSION Hypertension status: controlled  Satisfied with current treatment? yes Duration of hypertension: years BP monitoring frequency:  weekly BP range: 130/70-80 BP medication side effects:  no Medication compliance: excellent compliance Previous BP meds:amlodipine and losartan (cozaar) Aspirin: no Recurrent headaches: no Visual changes: no Palpitations: no Dyspnea: no Chest pain: no Lower extremity edema: no Dizzy/lightheaded: no   DIABETES Hypoglycemic episodes:no Polydipsia/polyuria: no Visual disturbance: no Chest pain: no Paresthesias: no Glucose Monitoring: no  Accucheck frequency: Daily  Fasting glucose: 120-130s  Post prandial:  Evening:  Before meals: Taking Insulin?: no  Long acting insulin:  Short acting insulin: Blood Pressure Monitoring: weekly Retinal Examination: not up to date- plans to reschedule her appt Foot Exam: Up to Date Diabetic Education: Not Completed Pneumovax: Up to Date Influenza: Up to Date Aspirin: yes  CHRONIC KIDNEY DISEASE CKD status: controlled Medications renally dose: yes Previous renal evaluation: yes Pneumovax:  Up to Date Influenza Vaccine:  Up to Date  ANEMIA Anemia status: stable Etiology of anemia:CKD Duration of anemia treatment:  Compliance with treatment: excellent compliance Iron supplementation side effects: no Severity of anemia: moderate Fatigue: yes Decreased exercise tolerance: no  Dyspnea on exertion: no Palpitations: no Bleeding: no Pica: no  Relevant past medical, surgical, family and social history reviewed and updated as indicated. Interim medical  history since our last visit reviewed. Allergies and medications reviewed and updated.  Review of Systems  Eyes:  Negative for visual disturbance.  Respiratory:  Negative for cough, chest tightness and shortness of breath.   Cardiovascular:  Negative for chest pain, palpitations and leg swelling.  Endocrine: Negative for polydipsia and polyuria.  Neurological:  Negative for dizziness, numbness and headaches.    Per HPI unless specifically indicated above     Objective:    BP 132/82   Pulse 93   Temp (!) 97.5 F (36.4 C) (Oral)   Wt 219 lb 4.8 oz (99.5 kg)   SpO2 95%   BMI 38.24 kg/m   Wt Readings from Last 3 Encounters:  08/24/22 219 lb 4.8 oz (99.5 kg)  08/14/22 217 lb (98.4 kg)  08/01/22 213 lb 9.6 oz (96.9 kg)    Physical Exam Vitals and nursing note reviewed.  Constitutional:      General: She is not in acute distress.    Appearance: Normal appearance. She is normal weight. She is not ill-appearing, toxic-appearing or diaphoretic.  HENT:     Head: Normocephalic.     Right Ear: External ear normal.     Left Ear: External ear normal.     Nose: Nose normal.     Mouth/Throat:     Mouth: Mucous membranes are moist.     Pharynx: Oropharynx is clear.  Eyes:     General:        Right eye: No discharge.        Left eye: No discharge.     Extraocular Movements: Extraocular movements intact.     Conjunctiva/sclera: Conjunctivae normal.     Pupils: Pupils are equal,  round, and reactive to light.  Cardiovascular:     Rate and Rhythm: Normal rate and regular rhythm.     Heart sounds: No murmur heard. Pulmonary:     Effort: Pulmonary effort is normal. No respiratory distress.     Breath sounds: Normal breath sounds. No wheezing or rales.  Musculoskeletal:     Cervical back: Normal range of motion and neck supple.  Skin:    General: Skin is warm and dry.     Capillary Refill: Capillary refill takes less than 2 seconds.  Neurological:     General: No focal deficit  present.     Mental Status: She is alert and oriented to person, place, and time. Mental status is at baseline.  Psychiatric:        Mood and Affect: Mood normal.        Behavior: Behavior normal.        Thought Content: Thought content normal.        Judgment: Judgment normal.     Results for orders placed or performed during the hospital encounter of 07/17/22  Glucose, capillary  Result Value Ref Range   Glucose-Capillary 149 (H) 70 - 99 mg/dL  Glucose, capillary  Result Value Ref Range   Glucose-Capillary 153 (H) 70 - 99 mg/dL   Comment 1 Notify RN    Comment 2 Document in Chart   Surgical pathology  Result Value Ref Range   SURGICAL PATHOLOGY      SURGICAL PATHOLOGY CASE: [id number] PATIENT: [facility] Report     Specimen Submitted: A. Breast, left B. Breast, left, medial superior margin  Clinical History: Left breast DCIS      DIAGNOSIS: A. BREAST, LEFT; LUMPECTOMY: - DUCTAL CARCINOMA IN SITU (DCIS). - SEE CANCER SUMMARY BELOW. - BIOPSY SITE CHANGE WITH CLIP. - RF ID TAG PRESENT. - VASCULAR CALCIFICATIONS.  B. BREAST, LEFT MEDIAL SUPERIOR MARGIN; EXCISION: - DUCTAL CARCINOMA IN SITU (DCIS). - SEE CANCER SUMMARY BELOW. - SEE COMMENT.  CANCER CASE SUMMARY: DUCTAL CARCINOMA IN SITU OF THE BREAST Standard(s): AJCC-UICC 8  SPECIMEN Procedure: Lumpectomy Specimen Laterality: Left  TUMOR Histologic Type: Ductal carcinoma in situ Size (Extent) of DCIS:  at least 20 mm Nuclear Grade: Grade 3 (high) Necrosis: Present, central (expansive comedonecrosis)  MARGINS Margin Status: All margins negative for DCIS      Distance from DCIS to closest margin: Less than 1 mm       Specify closest margin: Posterior  REGIONAL LYMPH NODES Regional Lymph node Status: Not applicable (no regional lymph nodes submitted or found)  DISTANT METASTASIS Distant Site(s) Involved, if applicable (select all that apply):  Not applicable  PATHOLOGIC STAGE CLASSIFICATION (pTNM, AJCC 8th Edition) TNM Descriptors: Not applicable pTis (DCIS) Regional Lymph Nodes Modifier: Not applicable pN - Not applicable pM - Not applicable  SPECIAL STUDIES Estrogen Receptor (ER) Status: NEGATIVE (LESS THAN 1%)         Internal control cells present and stain as expected         A second pathologist review was performed by Dr. Ronnald Ramp.  Cold Ischemia and Fixation Times: Meet requirements specified in latest version of the ASCO/CAP guidelines Testing Performed on Block Number(s): B5  METHODS Fixative: Formalin Estrogen Receptor:  FDA cleared ([place]) Primary Antibody:  SP1  Comment: In the main lumpectomy specimen (part A), focal DCIS is present in the medial/superio r aspect of the specimen. In the re-excised medial/superior margin (part B) DCIS is present in multiple blocks and measures  up to 20 mm in largest linear dimension. The final margin status is reported in the CAP Cancer Summary above.  IHC slides were prepared by Launa Grill, [place], and interpreted by Quay Burow, MD. All controls stained appropriately.  The ASCO/CAP 2020 guidelines recommend testing for ER expression in cases of newly diagnosed DCIS. However, testing for PgR expression is considered optional. There is no data currently supporting the prognostic or predictive value of PgR testing in DCIS independent of ER. Testing for PgR will therefore not be routinely performed on cases of newly diagnosed DCIS.  Reference: [facility], [place] M, et al. Estrogen and Progesterone Receptor Testing in Breast Cancer: American Society of Clinical Oncology/College of American Pathologists Guideline Update. Arch Pathol Lab Med. [phone] May;144(5):545-563. doi : 10.5858/arpa.2019-0904-SA. Epub 2020 Jan 13. PMID: [id number]. (v1.5.0.1)  GROSS DESCRIPTION: Intraoperative Consultation:     Labeled: Left breast     Received: Fresh      Specimen: Breast lumpectomy     Pathologic evaluation performed: Gross margin evaluation     Diagnosis: IOC: X clip present.  RF ID present.  No obvious mass. Dense fibrous tissue near biopsy clip that extends to medial superior margin.     Communicated to: Called to Dr. Hampton Abbot at 8:46 AM on 07/17/2022 Quay Burow, MD     Tissue submitted: None  A. Labeled: Left breast Received: Fresh Specimen radiograph image(s) available for review Radiographic findings: A clip and RF ID tag are present. Time in fixative: Collected at 8:28 AM on 07/17/2022 and placed in formalin at 8:48 AM on 07/17/2022 Cold ischemic time: Less than 20 minutes Total fixation time: Approximately 11.5 hours Type of procedure: Breast lumpectomy Location / laterality of specimen: Left breast Orientation of specimen: The specim en is received inked. Inking: Anterior = green Inferior = blue Lateral = orange Medial = yellow Posterior = black Superior = red Size of specimen: 5.9 (superior to inferior) x 5 (medial to lateral) x 3.2 (anterior to posterior) cm Skin: None grossly appreciated.  Biopsy site: There is a biopsy site which contains an X shaped clip.  Number of discrete masses: None grossly appreciated. Description of tissue: Sectioning reveals yellow lobulated adipose tissue with 2 ill-defined areas of white-tan firm fibrous tissue, 1.2 x 0.8 x 0.6 cm (fibrous area A) and 1.5 x 1.3 x 1.2 cm (fibrous area B). The X clip is located at the peripheral edge of fibrous area A, adjacent to fibrous area B.  These fibrous areas are 0.6 cm apart.  Fibrous area A is 1.7 cm to the anterior margin, 1.7 cm to the posterior margin, 1.7 cm to the superior margin, 0.8 cm to the inferior margin, 0.9 cm to the medial margin, and 2 cm to the lateral margin.  Fibrous area B is less than 0.1 cm to the s uperior margin, less than 0.1 cm to the medial margin, 0.2 cm to the anterior margin, 1.7 cm to the posterior margin, 2.1 cm  to the inferior margin, and 1 cm to the closest lateral margin. The fat to fibrous tissue ratio is 85:15.  Block summary (specimen submitted entirely): 1 - 6 - medial margin, perpendicularly sectioned 7 - 53 - central sections, submitted entirely and sequentially from medial to lateral      7 - 10 - 1 section, serially sectioned      11 - 14 - 1 section, serially sectioned           12 - fibrous area B  15 - 18 - 1 section, serially sectioned           16 - fibrous area B      19 - 22 - 1 section, serially sectioned           19 - 20 - clip site                19 - fibrous area A                20 - fibrous area B      23 - 26 - 1 section, serially sectioned           23 - 24 - clip site                23 - fibrous area A                24 - fibrous area B      27 - 31 - 1 section, serially sectioned      32 - 35 - 1 section, serially s ectioned      36 - 38 - 1 section, trisected      39 - 41 - 1 section, trisected      42 - 44 - 1 section, trisected      45 - 47 - 1 section, trisected      48 - 50 - 1 section, trisected      51 - 53 - 1 section, trisected 54 - 57 - lateral margin, perpendicularly sectioned  B. Labeled: Left breast medial superior margin Received: Fresh Specimen radiograph image(s) available for review Radiographic findings: None provided Time in fixative: Collected at 8:53 AM on 07/17/2022 and placed in formalin at 9:04 AM on 07/17/2022 Cold ischemic time: Less than 15 minutes Total fixation time: Approximately 11.25 hours Type of procedure: Breast lumpectomy Location / laterality of specimen: Left breast Orientation of specimen: The specimen is received inked. Inking: Anterior = green Inferior = blue Lateral = orange Medial = yellow Posterior = black Superior = red Size of specimen: 5.2 (superior to inferior) x 2.7 (superior to inferior) x 1.5 (medial to lateral) cm Skin:  None grossly appreciated.  Biopsy site: None grossly  appreciated.  Number of discrete masses: None grossly appreciated. Description of tissue: The cut surface displays yellow lobulated adipose tissue with scattered areas of hemorrhage and white fibrous tissue.  The fat to fibrous tissue ratio is 90:10.  Block summary (specimen submitted entirely): 1 - superior margin, perpendicularly sectioned 2 - 12 - central sections, submitted entirely and sequentially from superior to inferior 13 - inferior margin, perpendicularly sectioned  RB 05/17/2023     Final Diagnosis performed by Quay Burow, MD.   Electronically signed 07/19/2022 11:01:19AM The electronic signature indicates that the named Attending Pathologist has evaluated the specimen Technical component performed at [facility], [address] [place], [place] [address] Lab: [phone] Dir: Rush Farmer, MD, MMM  Professional component performed at [facility], [facility], [place] , [place], [place] [address] Lab: [phone] Dir: Kathi Simpers, MD       Assessment & Plan:   Problem List Items Addressed This Visit       Cardiovascular and Mediastinum   Hypertension - Primary    Chronic.  Controlled.  Continue with current medication regimen of Amlodipine 5mg  daily and Losartan 25mg  daily.  Labs ordered today.  Follow up in 6 months.  Continue to check blood pressures  at home. Call sooner if concerns arise.        Relevant Orders   Comp Met (CMET)     Endocrine   Controlled diabetes mellitus type 2 with complications (HCC)    Chronic.  Controlled.  Last A1c 7.0%.  Continue with current medication regimen of Rybelsus 3mg .   Patient needs updated eye exam.  Labs ordered today.  Follow up in 6 months. Call sooner if concerns arise.        Relevant Orders   HgB A1c     Genitourinary   Anemia in stage 4 chronic kidney disease (HCC) (Chronic)    Chronic.  Has been stable. Baseline creatinine of 1.6.  Labs ordered today.  Followed by Nephrology.   Also followed at the Cancer center by Dr. Tasia Catchings.      Relevant Orders   CBC w/Diff     Other   Hyperlipidemia    Chronic.  Controlled.  Continue with current medication regimen.  Labs ordered today.  Return to clinic in 6 months for reevaluation.  Call sooner if concerns arise.        Relevant Orders   Lipid Profile   Myalgia due to statin    Patient had significant myalgias due to statin use.  Unable to tolerate statins.        Follow up plan: Return in about 6 months (around 02/24/2023) for Physical and Fasting labs.

## 2022-08-24 ENCOUNTER — Encounter: Payer: Self-pay | Admitting: Nurse Practitioner

## 2022-08-24 ENCOUNTER — Other Ambulatory Visit: Payer: Self-pay | Admitting: *Deleted

## 2022-08-24 ENCOUNTER — Ambulatory Visit: Payer: Medicare HMO | Admitting: Nurse Practitioner

## 2022-08-24 VITALS — BP 132/82 | HR 93 | Temp 97.5°F | Wt 219.3 lb

## 2022-08-24 DIAGNOSIS — T466X5A Adverse effect of antihyperlipidemic and antiarteriosclerotic drugs, initial encounter: Secondary | ICD-10-CM | POA: Diagnosis not present

## 2022-08-24 DIAGNOSIS — D631 Anemia in chronic kidney disease: Secondary | ICD-10-CM | POA: Diagnosis not present

## 2022-08-24 DIAGNOSIS — N184 Chronic kidney disease, stage 4 (severe): Secondary | ICD-10-CM

## 2022-08-24 DIAGNOSIS — D500 Iron deficiency anemia secondary to blood loss (chronic): Secondary | ICD-10-CM

## 2022-08-24 DIAGNOSIS — E118 Type 2 diabetes mellitus with unspecified complications: Secondary | ICD-10-CM | POA: Diagnosis not present

## 2022-08-24 DIAGNOSIS — D0512 Intraductal carcinoma in situ of left breast: Secondary | ICD-10-CM

## 2022-08-24 DIAGNOSIS — E782 Mixed hyperlipidemia: Secondary | ICD-10-CM

## 2022-08-24 DIAGNOSIS — M791 Myalgia, unspecified site: Secondary | ICD-10-CM

## 2022-08-24 DIAGNOSIS — I10 Essential (primary) hypertension: Secondary | ICD-10-CM | POA: Diagnosis not present

## 2022-08-24 MED ORDER — AMLODIPINE BESYLATE 10 MG PO TABS: 10.0000 mg | ORAL_TABLET | Freq: Every day | ORAL | 1 refills | Status: DC

## 2022-08-24 MED ORDER — LOSARTAN POTASSIUM 25 MG PO TABS: 25.0000 mg | ORAL_TABLET | Freq: Every day | ORAL | 1 refills | Status: DC

## 2022-08-24 MED ORDER — RYBELSUS 3 MG PO TABS: 1.0000 | ORAL_TABLET | Freq: Every day | ORAL | 1 refills | Status: DC

## 2022-08-24 NOTE — Assessment & Plan Note (Signed)
Chronic.  Controlled.  Last A1c 7.0%.  Continue with current medication regimen of Rybelsus 3mg .   Patient needs updated eye exam.  Labs ordered today.  Follow up in 6 months. Call sooner if concerns arise.

## 2022-08-24 NOTE — Assessment & Plan Note (Signed)
Patient had significant myalgias due to statin use.  Unable to tolerate statins.

## 2022-08-24 NOTE — Addendum Note (Signed)
Addended by: [name] on: 08/24/2022 08:51 AM   Modules accepted: Orders

## 2022-08-24 NOTE — Assessment & Plan Note (Signed)
Chronic.  Has been stable. Baseline creatinine of 1.6.  Labs ordered today.  Followed by Nephrology.  Also followed at the Cancer center by Dr. Tasia Catchings.

## 2022-08-24 NOTE — Assessment & Plan Note (Signed)
Chronic.  Controlled.  Continue with current medication regimen.  Labs ordered today.  Return to clinic in 6 months for reevaluation.  Call sooner if concerns arise.  ? ?

## 2022-08-24 NOTE — Assessment & Plan Note (Signed)
Chronic.  Controlled.  Continue with current medication regimen of Amlodipine 5mg  daily and Losartan 25mg  daily.  Labs ordered today.  Follow up in 6 months.  Continue to check blood pressures at home. Call sooner if concerns arise.

## 2022-08-25 LAB — LIPID PANEL
Chol/HDL Ratio: 3.3 ratio (ref 0.0–4.4)
Cholesterol, Total: 195 mg/dL (ref 100–199)
HDL: 60 mg/dL (ref >39)
LDL Chol Calc (NIH): 122 mg/dL — ABNORMAL HIGH (ref 0–99)
Triglycerides: 69 mg/dL (ref 0–149)
VLDL Cholesterol Cal: 13 mg/dL (ref 5–40)

## 2022-08-25 LAB — CBC WITH DIFFERENTIAL/PLATELET
Basophils Absolute: 0 10*3/uL (ref 0.0–0.2)
Basos: 0 %
EOS (ABSOLUTE): 0.1 10*3/uL (ref 0.0–0.4)
Eos: 2 %
Hematocrit: 34.3 % (ref 34.0–46.6)
Hemoglobin: 10.2 g/dL — ABNORMAL LOW (ref 11.1–15.9)
Immature Grans (Abs): 0 10*3/uL (ref 0.0–0.1)
Immature Granulocytes: 1 %
Lymphocytes Absolute: 1.2 10*3/uL (ref 0.7–3.1)
Lymphs: 23 %
MCH: 23.7 pg — ABNORMAL LOW (ref 26.6–33.0)
MCHC: 29.7 g/dL — ABNORMAL LOW (ref 31.5–35.7)
MCV: 80 fL (ref 79–97)
Monocytes Absolute: 0.3 10*3/uL (ref 0.1–0.9)
Monocytes: 6 %
Neutrophils Absolute: 3.6 10*3/uL (ref 1.4–7.0)
Neutrophils: 68 %
Platelets: 254 10*3/uL (ref 150–450)
RBC: 4.31 x10E6/uL (ref 3.77–5.28)
RDW: 13.9 % (ref 11.7–15.4)
WBC: 5.3 10*3/uL (ref 3.4–10.8)

## 2022-08-25 LAB — COMPREHENSIVE METABOLIC PANEL
ALT: 12 IU/L (ref 0–32)
AST: 21 IU/L (ref 0–40)
Albumin/Globulin Ratio: 1.4 (ref 1.2–2.2)
Albumin: 4.2 g/dL (ref 3.8–4.8)
Alkaline Phosphatase: 142 IU/L — ABNORMAL HIGH (ref 44–121)
BUN/Creatinine Ratio: 16 (ref 12–28)
BUN: 28 mg/dL — ABNORMAL HIGH (ref 8–27)
Bilirubin Total: <0.2 mg/dL (ref 0.0–1.2)
CO2: 23 mmol/L (ref 20–29)
Calcium: 9.4 mg/dL (ref 8.7–10.3)
Chloride: 108 mmol/L — ABNORMAL HIGH (ref 96–106)
Creatinine, Ser: 1.73 mg/dL — ABNORMAL HIGH (ref 0.57–1.00)
Globulin, Total: 3 g/dL (ref 1.5–4.5)
Glucose: 145 mg/dL — ABNORMAL HIGH (ref 70–99)
Potassium: 5.7 mmol/L — ABNORMAL HIGH (ref 3.5–5.2)
Sodium: 147 mmol/L — ABNORMAL HIGH (ref 134–144)
Total Protein: 7.2 g/dL (ref 6.0–8.5)
eGFR: 31 mL/min/{1.73_m2} — ABNORMAL LOW (ref >59)

## 2022-08-25 LAB — HEMOGLOBIN A1C
Est. average glucose Bld gHb Est-mCnc: 177 mg/dL
Hgb A1c MFr Bld: 7.8 % — ABNORMAL HIGH (ref 4.8–5.6)

## 2022-08-27 NOTE — Progress Notes (Signed)
Please let patient know that her kidney function and anemia are stable.  Her A1c did increase to 7.8.  I recommend we increase the dose of Rybelsus to 7mg .  She should follow up in 3 months.  If she agrees, I will send this into the pharmacy.

## 2022-08-28 ENCOUNTER — Ambulatory Visit: Admission: RE | Admit: 2022-08-28 | Payer: Medicare HMO | Source: Ambulatory Visit

## 2022-08-28 DIAGNOSIS — Z171 Estrogen receptor negative status [ER-]: Secondary | ICD-10-CM | POA: Diagnosis not present

## 2022-08-28 DIAGNOSIS — Z510 Encounter for antineoplastic radiation therapy: Secondary | ICD-10-CM | POA: Diagnosis not present

## 2022-08-28 DIAGNOSIS — D0512 Intraductal carcinoma in situ of left breast: Secondary | ICD-10-CM | POA: Diagnosis not present

## 2022-08-29 ENCOUNTER — Ambulatory Visit
Admission: RE | Admit: 2022-08-29 | Discharge: 2022-08-29 | Disposition: A | Discharge status: Home / Self Care | Payer: Medicare HMO | Source: Ambulatory Visit | Attending: Radiation Oncology | Admitting: Radiation Oncology

## 2022-08-29 ENCOUNTER — Other Ambulatory Visit: Payer: Self-pay

## 2022-08-29 DIAGNOSIS — Z171 Estrogen receptor negative status [ER-]: Secondary | ICD-10-CM | POA: Diagnosis not present

## 2022-08-29 DIAGNOSIS — Z510 Encounter for antineoplastic radiation therapy: Secondary | ICD-10-CM | POA: Diagnosis not present

## 2022-08-29 DIAGNOSIS — D0512 Intraductal carcinoma in situ of left breast: Secondary | ICD-10-CM | POA: Diagnosis not present

## 2022-08-29 LAB — RAD ONC ARIA SESSION SUMMARY
Course Elapsed Days: 0
Plan Fractions Treated to Date: 1
Plan Prescribed Dose Per Fraction: 2.66 Gy
Plan Total Fractions Prescribed: 16
Plan Total Prescribed Dose: 42.56 Gy
Reference Point Dosage Given to Date: 2.66 Gy
Reference Point Session Dosage Given: 2.66 Gy
Session Number: 1

## 2022-08-29 MED ORDER — RYBELSUS 7 MG PO TABS: 7.0000 mg | ORAL_TABLET | Freq: Every day | ORAL | 1 refills | Status: DC

## 2022-08-29 NOTE — Addendum Note (Signed)
Addended by: [name] on: 08/29/2022 09:03 AM   Modules accepted: Orders

## 2022-08-29 NOTE — Progress Notes (Signed)
Medication sent to the pharmacy.

## 2022-08-30 ENCOUNTER — Other Ambulatory Visit: Payer: Self-pay

## 2022-08-30 ENCOUNTER — Ambulatory Visit
Admission: RE | Admit: 2022-08-30 | Discharge: 2022-08-30 | Disposition: A | Discharge status: Home / Self Care | Payer: Medicare HMO | Source: Ambulatory Visit | Attending: Radiation Oncology | Admitting: Radiation Oncology

## 2022-08-30 DIAGNOSIS — D0512 Intraductal carcinoma in situ of left breast: Secondary | ICD-10-CM | POA: Diagnosis not present

## 2022-08-30 DIAGNOSIS — Z510 Encounter for antineoplastic radiation therapy: Secondary | ICD-10-CM | POA: Diagnosis not present

## 2022-08-30 DIAGNOSIS — Z171 Estrogen receptor negative status [ER-]: Secondary | ICD-10-CM | POA: Diagnosis not present

## 2022-08-30 LAB — RAD ONC ARIA SESSION SUMMARY
Course Elapsed Days: 1
Plan Fractions Treated to Date: 2
Plan Prescribed Dose Per Fraction: 2.66 Gy
Plan Total Fractions Prescribed: 16
Plan Total Prescribed Dose: 42.56 Gy
Reference Point Dosage Given to Date: 5.32 Gy
Reference Point Session Dosage Given: 2.66 Gy
Session Number: 2

## 2022-08-31 ENCOUNTER — Ambulatory Visit: Payer: Medicare HMO

## 2022-09-03 ENCOUNTER — Other Ambulatory Visit: Payer: Self-pay

## 2022-09-03 ENCOUNTER — Ambulatory Visit
Admission: RE | Admit: 2022-09-03 | Discharge: 2022-09-03 | Disposition: A | Discharge status: Home / Self Care | Payer: Medicare HMO | Source: Ambulatory Visit | Attending: Radiation Oncology | Admitting: Radiation Oncology

## 2022-09-03 DIAGNOSIS — D0512 Intraductal carcinoma in situ of left breast: Secondary | ICD-10-CM | POA: Insufficient documentation

## 2022-09-03 DIAGNOSIS — Z171 Estrogen receptor negative status [ER-]: Secondary | ICD-10-CM | POA: Diagnosis not present

## 2022-09-03 DIAGNOSIS — Z510 Encounter for antineoplastic radiation therapy: Secondary | ICD-10-CM | POA: Diagnosis not present

## 2022-09-03 LAB — RAD ONC ARIA SESSION SUMMARY
Course Elapsed Days: 5
Plan Fractions Treated to Date: 3
Plan Prescribed Dose Per Fraction: 2.66 Gy
Plan Total Fractions Prescribed: 16
Plan Total Prescribed Dose: 42.56 Gy
Reference Point Dosage Given to Date: 7.98 Gy
Reference Point Session Dosage Given: 2.66 Gy
Session Number: 3

## 2022-09-04 ENCOUNTER — Other Ambulatory Visit: Payer: Self-pay

## 2022-09-04 ENCOUNTER — Ambulatory Visit
Admission: RE | Admit: 2022-09-04 | Discharge: 2022-09-04 | Disposition: A | Discharge status: Home / Self Care | Payer: Medicare HMO | Source: Ambulatory Visit | Attending: Radiation Oncology | Admitting: Radiation Oncology

## 2022-09-04 DIAGNOSIS — Z171 Estrogen receptor negative status [ER-]: Secondary | ICD-10-CM | POA: Diagnosis not present

## 2022-09-04 DIAGNOSIS — D0512 Intraductal carcinoma in situ of left breast: Secondary | ICD-10-CM | POA: Diagnosis not present

## 2022-09-04 DIAGNOSIS — Z510 Encounter for antineoplastic radiation therapy: Secondary | ICD-10-CM | POA: Diagnosis not present

## 2022-09-04 LAB — RAD ONC ARIA SESSION SUMMARY
Course Elapsed Days: 6
Plan Fractions Treated to Date: 4
Plan Prescribed Dose Per Fraction: 2.66 Gy
Plan Total Fractions Prescribed: 16
Plan Total Prescribed Dose: 42.56 Gy
Reference Point Dosage Given to Date: 10.64 Gy
Reference Point Session Dosage Given: 2.66 Gy
Session Number: 4

## 2022-09-05 ENCOUNTER — Other Ambulatory Visit: Payer: Self-pay

## 2022-09-05 ENCOUNTER — Inpatient Hospital Stay: Payer: Medicare HMO | Attending: Oncology

## 2022-09-05 ENCOUNTER — Ambulatory Visit
Admission: RE | Admit: 2022-09-05 | Discharge: 2022-09-05 | Disposition: A | Discharge status: Home / Self Care | Payer: Medicare HMO | Source: Ambulatory Visit | Attending: Radiation Oncology | Admitting: Radiation Oncology

## 2022-09-05 DIAGNOSIS — D0512 Intraductal carcinoma in situ of left breast: Secondary | ICD-10-CM | POA: Diagnosis not present

## 2022-09-05 DIAGNOSIS — Z171 Estrogen receptor negative status [ER-]: Secondary | ICD-10-CM | POA: Diagnosis not present

## 2022-09-05 DIAGNOSIS — Z510 Encounter for antineoplastic radiation therapy: Secondary | ICD-10-CM | POA: Diagnosis not present

## 2022-09-05 LAB — RAD ONC ARIA SESSION SUMMARY
Course Elapsed Days: 7
Plan Fractions Treated to Date: 5
Plan Prescribed Dose Per Fraction: 2.66 Gy
Plan Total Fractions Prescribed: 16
Plan Total Prescribed Dose: 42.56 Gy
Reference Point Dosage Given to Date: 13.3 Gy
Reference Point Session Dosage Given: 2.66 Gy
Session Number: 5

## 2022-09-06 ENCOUNTER — Ambulatory Visit
Admission: RE | Admit: 2022-09-06 | Discharge: 2022-09-06 | Disposition: A | Discharge status: Home / Self Care | Payer: Medicare HMO | Source: Ambulatory Visit | Attending: Radiation Oncology | Admitting: Radiation Oncology

## 2022-09-06 ENCOUNTER — Other Ambulatory Visit: Payer: Self-pay

## 2022-09-06 DIAGNOSIS — D0512 Intraductal carcinoma in situ of left breast: Secondary | ICD-10-CM | POA: Diagnosis not present

## 2022-09-06 DIAGNOSIS — Z510 Encounter for antineoplastic radiation therapy: Secondary | ICD-10-CM | POA: Diagnosis not present

## 2022-09-06 DIAGNOSIS — Z171 Estrogen receptor negative status [ER-]: Secondary | ICD-10-CM | POA: Diagnosis not present

## 2022-09-06 LAB — RAD ONC ARIA SESSION SUMMARY
Course Elapsed Days: 8
Plan Fractions Treated to Date: 6
Plan Prescribed Dose Per Fraction: 2.66 Gy
Plan Total Fractions Prescribed: 16
Plan Total Prescribed Dose: 42.56 Gy
Reference Point Dosage Given to Date: 15.96 Gy
Reference Point Session Dosage Given: 2.66 Gy
Session Number: 6

## 2022-09-07 ENCOUNTER — Ambulatory Visit
Admission: RE | Admit: 2022-09-07 | Discharge: 2022-09-07 | Disposition: A | Discharge status: Home / Self Care | Payer: Medicare HMO | Source: Ambulatory Visit | Attending: Radiation Oncology | Admitting: Radiation Oncology

## 2022-09-07 ENCOUNTER — Other Ambulatory Visit: Payer: Self-pay

## 2022-09-07 DIAGNOSIS — Z510 Encounter for antineoplastic radiation therapy: Secondary | ICD-10-CM | POA: Diagnosis not present

## 2022-09-07 DIAGNOSIS — Z171 Estrogen receptor negative status [ER-]: Secondary | ICD-10-CM | POA: Diagnosis not present

## 2022-09-07 DIAGNOSIS — D0512 Intraductal carcinoma in situ of left breast: Secondary | ICD-10-CM | POA: Diagnosis not present

## 2022-09-07 LAB — RAD ONC ARIA SESSION SUMMARY
Course Elapsed Days: 9
Plan Fractions Treated to Date: 7
Plan Prescribed Dose Per Fraction: 2.66 Gy
Plan Total Fractions Prescribed: 16
Plan Total Prescribed Dose: 42.56 Gy
Reference Point Dosage Given to Date: 18.62 Gy
Reference Point Session Dosage Given: 2.66 Gy
Session Number: 7

## 2022-09-10 ENCOUNTER — Ambulatory Visit
Admission: RE | Admit: 2022-09-10 | Discharge: 2022-09-10 | Disposition: A | Discharge status: Home / Self Care | Payer: Medicare HMO | Source: Ambulatory Visit | Attending: Radiation Oncology | Admitting: Radiation Oncology

## 2022-09-10 ENCOUNTER — Other Ambulatory Visit: Payer: Self-pay

## 2022-09-10 DIAGNOSIS — D0512 Intraductal carcinoma in situ of left breast: Secondary | ICD-10-CM | POA: Diagnosis not present

## 2022-09-10 DIAGNOSIS — Z171 Estrogen receptor negative status [ER-]: Secondary | ICD-10-CM | POA: Diagnosis not present

## 2022-09-10 DIAGNOSIS — Z510 Encounter for antineoplastic radiation therapy: Secondary | ICD-10-CM | POA: Diagnosis not present

## 2022-09-10 LAB — RAD ONC ARIA SESSION SUMMARY
Course Elapsed Days: 12
Plan Fractions Treated to Date: 8
Plan Prescribed Dose Per Fraction: 2.66 Gy
Plan Total Fractions Prescribed: 16
Plan Total Prescribed Dose: 42.56 Gy
Reference Point Dosage Given to Date: 21.28 Gy
Reference Point Session Dosage Given: 2.66 Gy
Session Number: 8

## 2022-09-11 ENCOUNTER — Other Ambulatory Visit: Payer: Self-pay

## 2022-09-11 ENCOUNTER — Ambulatory Visit
Admission: RE | Admit: 2022-09-11 | Discharge: 2022-09-11 | Disposition: A | Discharge status: Home / Self Care | Payer: Medicare HMO | Source: Ambulatory Visit | Attending: Radiation Oncology | Admitting: Radiation Oncology

## 2022-09-11 DIAGNOSIS — D0512 Intraductal carcinoma in situ of left breast: Secondary | ICD-10-CM | POA: Diagnosis not present

## 2022-09-11 DIAGNOSIS — Z171 Estrogen receptor negative status [ER-]: Secondary | ICD-10-CM | POA: Diagnosis not present

## 2022-09-11 DIAGNOSIS — Z510 Encounter for antineoplastic radiation therapy: Secondary | ICD-10-CM | POA: Diagnosis not present

## 2022-09-11 LAB — RAD ONC ARIA SESSION SUMMARY
Course Elapsed Days: 13
Plan Fractions Treated to Date: 9
Plan Prescribed Dose Per Fraction: 2.66 Gy
Plan Total Fractions Prescribed: 16
Plan Total Prescribed Dose: 42.56 Gy
Reference Point Dosage Given to Date: 23.94 Gy
Reference Point Session Dosage Given: 2.66 Gy
Session Number: 9

## 2022-09-12 ENCOUNTER — Other Ambulatory Visit: Payer: Self-pay

## 2022-09-12 ENCOUNTER — Ambulatory Visit
Admission: RE | Admit: 2022-09-12 | Discharge: 2022-09-12 | Disposition: A | Discharge status: Home / Self Care | Payer: Medicare HMO | Source: Ambulatory Visit | Attending: Radiation Oncology | Admitting: Radiation Oncology

## 2022-09-12 DIAGNOSIS — Z510 Encounter for antineoplastic radiation therapy: Secondary | ICD-10-CM | POA: Diagnosis not present

## 2022-09-12 DIAGNOSIS — D0512 Intraductal carcinoma in situ of left breast: Secondary | ICD-10-CM | POA: Diagnosis not present

## 2022-09-12 DIAGNOSIS — Z171 Estrogen receptor negative status [ER-]: Secondary | ICD-10-CM | POA: Diagnosis not present

## 2022-09-12 LAB — RAD ONC ARIA SESSION SUMMARY
Course Elapsed Days: 14
Plan Fractions Treated to Date: 10
Plan Prescribed Dose Per Fraction: 2.66 Gy
Plan Total Fractions Prescribed: 16
Plan Total Prescribed Dose: 42.56 Gy
Reference Point Dosage Given to Date: 26.6 Gy
Reference Point Session Dosage Given: 2.66 Gy
Session Number: 10

## 2022-09-13 ENCOUNTER — Other Ambulatory Visit: Payer: Self-pay

## 2022-09-13 ENCOUNTER — Ambulatory Visit
Admission: RE | Admit: 2022-09-13 | Discharge: 2022-09-13 | Disposition: A | Discharge status: Home / Self Care | Payer: Medicare HMO | Source: Ambulatory Visit | Attending: Radiation Oncology | Admitting: Radiation Oncology

## 2022-09-13 DIAGNOSIS — D0512 Intraductal carcinoma in situ of left breast: Secondary | ICD-10-CM | POA: Diagnosis not present

## 2022-09-13 DIAGNOSIS — Z510 Encounter for antineoplastic radiation therapy: Secondary | ICD-10-CM | POA: Diagnosis not present

## 2022-09-13 DIAGNOSIS — Z171 Estrogen receptor negative status [ER-]: Secondary | ICD-10-CM | POA: Diagnosis not present

## 2022-09-13 LAB — RAD ONC ARIA SESSION SUMMARY
Course Elapsed Days: 15
Plan Fractions Treated to Date: 11
Plan Prescribed Dose Per Fraction: 2.66 Gy
Plan Total Fractions Prescribed: 16
Plan Total Prescribed Dose: 42.56 Gy
Reference Point Dosage Given to Date: 29.26 Gy
Reference Point Session Dosage Given: 2.66 Gy
Session Number: 11

## 2022-09-14 ENCOUNTER — Ambulatory Visit
Admission: RE | Admit: 2022-09-14 | Discharge: 2022-09-14 | Disposition: A | Discharge status: Home / Self Care | Payer: Medicare HMO | Source: Ambulatory Visit | Attending: Radiation Oncology | Admitting: Radiation Oncology

## 2022-09-14 ENCOUNTER — Other Ambulatory Visit: Payer: Self-pay

## 2022-09-14 DIAGNOSIS — Z171 Estrogen receptor negative status [ER-]: Secondary | ICD-10-CM | POA: Diagnosis not present

## 2022-09-14 DIAGNOSIS — Z510 Encounter for antineoplastic radiation therapy: Secondary | ICD-10-CM | POA: Diagnosis not present

## 2022-09-14 DIAGNOSIS — D0512 Intraductal carcinoma in situ of left breast: Secondary | ICD-10-CM | POA: Diagnosis not present

## 2022-09-14 LAB — RAD ONC ARIA SESSION SUMMARY
Course Elapsed Days: 16
Plan Fractions Treated to Date: 12
Plan Prescribed Dose Per Fraction: 2.66 Gy
Plan Total Fractions Prescribed: 16
Plan Total Prescribed Dose: 42.56 Gy
Reference Point Dosage Given to Date: 31.92 Gy
Reference Point Session Dosage Given: 2.66 Gy
Session Number: 12

## 2022-09-17 ENCOUNTER — Other Ambulatory Visit: Payer: Self-pay

## 2022-09-17 ENCOUNTER — Ambulatory Visit
Admission: RE | Admit: 2022-09-17 | Discharge: 2022-09-17 | Disposition: A | Discharge status: Home / Self Care | Payer: Medicare HMO | Source: Ambulatory Visit | Attending: Radiation Oncology | Admitting: Radiation Oncology

## 2022-09-17 DIAGNOSIS — Z510 Encounter for antineoplastic radiation therapy: Secondary | ICD-10-CM | POA: Diagnosis not present

## 2022-09-17 DIAGNOSIS — D0512 Intraductal carcinoma in situ of left breast: Secondary | ICD-10-CM | POA: Diagnosis not present

## 2022-09-17 DIAGNOSIS — Z171 Estrogen receptor negative status [ER-]: Secondary | ICD-10-CM | POA: Diagnosis not present

## 2022-09-17 LAB — RAD ONC ARIA SESSION SUMMARY
Course Elapsed Days: 19
Plan Fractions Treated to Date: 13
Plan Prescribed Dose Per Fraction: 2.66 Gy
Plan Total Fractions Prescribed: 16
Plan Total Prescribed Dose: 42.56 Gy
Reference Point Dosage Given to Date: 34.58 Gy
Reference Point Session Dosage Given: 2.66 Gy
Session Number: 13

## 2022-09-18 ENCOUNTER — Ambulatory Visit
Admission: RE | Admit: 2022-09-18 | Discharge: 2022-09-18 | Disposition: A | Discharge status: Home / Self Care | Payer: Medicare HMO | Source: Ambulatory Visit | Attending: Radiation Oncology | Admitting: Radiation Oncology

## 2022-09-18 ENCOUNTER — Other Ambulatory Visit: Payer: Self-pay

## 2022-09-18 DIAGNOSIS — Z171 Estrogen receptor negative status [ER-]: Secondary | ICD-10-CM | POA: Diagnosis not present

## 2022-09-18 DIAGNOSIS — D0512 Intraductal carcinoma in situ of left breast: Secondary | ICD-10-CM | POA: Diagnosis not present

## 2022-09-18 DIAGNOSIS — Z510 Encounter for antineoplastic radiation therapy: Secondary | ICD-10-CM | POA: Diagnosis not present

## 2022-09-18 LAB — RAD ONC ARIA SESSION SUMMARY
Course Elapsed Days: 20
Plan Fractions Treated to Date: 14
Plan Prescribed Dose Per Fraction: 2.66 Gy
Plan Total Fractions Prescribed: 16
Plan Total Prescribed Dose: 42.56 Gy
Reference Point Dosage Given to Date: 37.24 Gy
Reference Point Session Dosage Given: 2.66 Gy
Session Number: 14

## 2022-09-19 ENCOUNTER — Ambulatory Visit
Admission: RE | Admit: 2022-09-19 | Discharge: 2022-09-19 | Disposition: A | Discharge status: Home / Self Care | Payer: Medicare HMO | Source: Ambulatory Visit | Attending: Radiation Oncology | Admitting: Radiation Oncology

## 2022-09-19 ENCOUNTER — Ambulatory Visit: Payer: Medicare HMO

## 2022-09-19 ENCOUNTER — Inpatient Hospital Stay: Payer: Medicare HMO

## 2022-09-19 ENCOUNTER — Other Ambulatory Visit: Payer: Self-pay

## 2022-09-19 DIAGNOSIS — Z510 Encounter for antineoplastic radiation therapy: Secondary | ICD-10-CM | POA: Diagnosis not present

## 2022-09-19 DIAGNOSIS — D0512 Intraductal carcinoma in situ of left breast: Secondary | ICD-10-CM | POA: Diagnosis not present

## 2022-09-19 DIAGNOSIS — Z171 Estrogen receptor negative status [ER-]: Secondary | ICD-10-CM | POA: Diagnosis not present

## 2022-09-19 LAB — RAD ONC ARIA SESSION SUMMARY
Course Elapsed Days: 21
Plan Fractions Treated to Date: 15
Plan Prescribed Dose Per Fraction: 2.66 Gy
Plan Total Fractions Prescribed: 16
Plan Total Prescribed Dose: 42.56 Gy
Reference Point Dosage Given to Date: 39.9 Gy
Reference Point Session Dosage Given: 2.66 Gy
Session Number: 15

## 2022-09-20 ENCOUNTER — Ambulatory Visit
Admission: RE | Admit: 2022-09-20 | Discharge: 2022-09-20 | Disposition: A | Discharge status: Home / Self Care | Payer: Medicare HMO | Source: Ambulatory Visit | Attending: Radiation Oncology | Admitting: Radiation Oncology

## 2022-09-20 ENCOUNTER — Other Ambulatory Visit: Payer: Self-pay

## 2022-09-20 ENCOUNTER — Ambulatory Visit: Admission: RE | Admit: 2022-09-20 | Payer: Medicare HMO | Source: Ambulatory Visit

## 2022-09-20 DIAGNOSIS — Z510 Encounter for antineoplastic radiation therapy: Secondary | ICD-10-CM | POA: Diagnosis not present

## 2022-09-20 DIAGNOSIS — Z171 Estrogen receptor negative status [ER-]: Secondary | ICD-10-CM | POA: Diagnosis not present

## 2022-09-20 DIAGNOSIS — D0512 Intraductal carcinoma in situ of left breast: Secondary | ICD-10-CM | POA: Diagnosis not present

## 2022-09-20 LAB — RAD ONC ARIA SESSION SUMMARY
Course Elapsed Days: 22
Plan Fractions Treated to Date: 16
Plan Prescribed Dose Per Fraction: 2.66 Gy
Plan Total Fractions Prescribed: 16
Plan Total Prescribed Dose: 42.56 Gy
Reference Point Dosage Given to Date: 42.56 Gy
Reference Point Session Dosage Given: 2.66 Gy
Session Number: 16

## 2022-09-21 ENCOUNTER — Other Ambulatory Visit: Payer: Self-pay

## 2022-09-21 ENCOUNTER — Ambulatory Visit
Admission: RE | Admit: 2022-09-21 | Discharge: 2022-09-21 | Disposition: A | Discharge status: Home / Self Care | Payer: Medicare HMO | Source: Ambulatory Visit | Attending: Radiation Oncology | Admitting: Radiation Oncology

## 2022-09-21 DIAGNOSIS — Z510 Encounter for antineoplastic radiation therapy: Secondary | ICD-10-CM | POA: Diagnosis not present

## 2022-09-21 LAB — RAD ONC ARIA SESSION SUMMARY
Course Elapsed Days: 23
Plan Fractions Treated to Date: 1
Plan Prescribed Dose Per Fraction: 2 Gy
Plan Total Fractions Prescribed: 8
Plan Total Prescribed Dose: 16 Gy
Reference Point Dosage Given to Date: 2 Gy
Reference Point Session Dosage Given: 2 Gy
Session Number: 17

## 2022-09-24 ENCOUNTER — Other Ambulatory Visit: Payer: Self-pay

## 2022-09-24 ENCOUNTER — Ambulatory Visit
Admission: RE | Admit: 2022-09-24 | Discharge: 2022-09-24 | Disposition: A | Discharge status: Home / Self Care | Payer: Medicare HMO | Source: Ambulatory Visit | Attending: Radiation Oncology | Admitting: Radiation Oncology

## 2022-09-24 DIAGNOSIS — Z510 Encounter for antineoplastic radiation therapy: Secondary | ICD-10-CM | POA: Diagnosis not present

## 2022-09-24 LAB — RAD ONC ARIA SESSION SUMMARY
Course Elapsed Days: 26
Plan Fractions Treated to Date: 2
Plan Prescribed Dose Per Fraction: 2 Gy
Plan Total Fractions Prescribed: 8
Plan Total Prescribed Dose: 16 Gy
Reference Point Dosage Given to Date: 4 Gy
Reference Point Session Dosage Given: 2 Gy
Session Number: 18

## 2022-09-25 ENCOUNTER — Ambulatory Visit
Admission: RE | Admit: 2022-09-25 | Discharge: 2022-09-25 | Disposition: A | Discharge status: Home / Self Care | Payer: Medicare HMO | Source: Ambulatory Visit | Attending: Radiation Oncology | Admitting: Radiation Oncology

## 2022-09-25 ENCOUNTER — Other Ambulatory Visit: Payer: Self-pay

## 2022-09-25 DIAGNOSIS — Z510 Encounter for antineoplastic radiation therapy: Secondary | ICD-10-CM | POA: Diagnosis not present

## 2022-09-25 LAB — RAD ONC ARIA SESSION SUMMARY
Course Elapsed Days: 27
Plan Fractions Treated to Date: 3
Plan Prescribed Dose Per Fraction: 2 Gy
Plan Total Fractions Prescribed: 8
Plan Total Prescribed Dose: 16 Gy
Reference Point Dosage Given to Date: 6 Gy
Reference Point Session Dosage Given: 2 Gy
Session Number: 19

## 2022-09-26 ENCOUNTER — Ambulatory Visit
Admission: RE | Admit: 2022-09-26 | Discharge: 2022-09-26 | Disposition: A | Discharge status: Home / Self Care | Payer: Medicare HMO | Source: Ambulatory Visit | Attending: Radiation Oncology | Admitting: Radiation Oncology

## 2022-09-26 ENCOUNTER — Other Ambulatory Visit: Payer: Self-pay

## 2022-09-26 DIAGNOSIS — Z510 Encounter for antineoplastic radiation therapy: Secondary | ICD-10-CM | POA: Diagnosis not present

## 2022-09-26 DIAGNOSIS — D0512 Intraductal carcinoma in situ of left breast: Secondary | ICD-10-CM | POA: Diagnosis not present

## 2022-09-26 DIAGNOSIS — Z171 Estrogen receptor negative status [ER-]: Secondary | ICD-10-CM | POA: Diagnosis not present

## 2022-09-26 LAB — RAD ONC ARIA SESSION SUMMARY
Course Elapsed Days: 28
Plan Fractions Treated to Date: 4
Plan Prescribed Dose Per Fraction: 2 Gy
Plan Total Fractions Prescribed: 8
Plan Total Prescribed Dose: 16 Gy
Reference Point Dosage Given to Date: 8 Gy
Reference Point Session Dosage Given: 2 Gy
Session Number: 20

## 2022-09-27 ENCOUNTER — Other Ambulatory Visit: Payer: Self-pay

## 2022-09-27 ENCOUNTER — Ambulatory Visit
Admission: RE | Admit: 2022-09-27 | Discharge: 2022-09-27 | Disposition: A | Discharge status: Home / Self Care | Payer: Medicare HMO | Source: Ambulatory Visit | Attending: Radiation Oncology | Admitting: Radiation Oncology

## 2022-09-27 DIAGNOSIS — Z510 Encounter for antineoplastic radiation therapy: Secondary | ICD-10-CM | POA: Diagnosis not present

## 2022-09-27 LAB — RAD ONC ARIA SESSION SUMMARY
Course Elapsed Days: 29
Plan Fractions Treated to Date: 5
Plan Prescribed Dose Per Fraction: 2 Gy
Plan Total Fractions Prescribed: 8
Plan Total Prescribed Dose: 16 Gy
Reference Point Dosage Given to Date: 10 Gy
Reference Point Session Dosage Given: 2 Gy
Session Number: 21

## 2022-09-28 ENCOUNTER — Other Ambulatory Visit: Payer: Self-pay

## 2022-09-28 ENCOUNTER — Ambulatory Visit
Admission: RE | Admit: 2022-09-28 | Discharge: 2022-09-28 | Disposition: A | Discharge status: Home / Self Care | Payer: Medicare HMO | Source: Ambulatory Visit | Attending: Radiation Oncology | Admitting: Radiation Oncology

## 2022-09-28 DIAGNOSIS — Z510 Encounter for antineoplastic radiation therapy: Secondary | ICD-10-CM | POA: Diagnosis not present

## 2022-09-28 LAB — RAD ONC ARIA SESSION SUMMARY
Course Elapsed Days: 30
Plan Fractions Treated to Date: 6
Plan Prescribed Dose Per Fraction: 2 Gy
Plan Total Fractions Prescribed: 8
Plan Total Prescribed Dose: 16 Gy
Reference Point Dosage Given to Date: 12 Gy
Reference Point Session Dosage Given: 2 Gy
Session Number: 22

## 2022-10-01 ENCOUNTER — Ambulatory Visit: Payer: Medicare HMO

## 2022-10-01 ENCOUNTER — Ambulatory Visit
Admission: RE | Admit: 2022-10-01 | Discharge: 2022-10-01 | Disposition: A | Discharge status: Home / Self Care | Payer: Medicare HMO | Source: Ambulatory Visit | Attending: Radiation Oncology | Admitting: Radiation Oncology

## 2022-10-01 ENCOUNTER — Other Ambulatory Visit: Payer: Self-pay

## 2022-10-01 DIAGNOSIS — Z510 Encounter for antineoplastic radiation therapy: Secondary | ICD-10-CM | POA: Diagnosis not present

## 2022-10-01 LAB — RAD ONC ARIA SESSION SUMMARY
Course Elapsed Days: 33
Plan Fractions Treated to Date: 7
Plan Prescribed Dose Per Fraction: 2 Gy
Plan Total Fractions Prescribed: 8
Plan Total Prescribed Dose: 16 Gy
Reference Point Dosage Given to Date: 14 Gy
Reference Point Session Dosage Given: 2 Gy
Session Number: 23

## 2022-10-02 ENCOUNTER — Encounter: Payer: Self-pay | Admitting: *Deleted

## 2022-10-02 ENCOUNTER — Ambulatory Visit
Admission: RE | Admit: 2022-10-02 | Discharge: 2022-10-02 | Disposition: A | Discharge status: Home / Self Care | Payer: Medicare HMO | Source: Ambulatory Visit | Attending: Radiation Oncology | Admitting: Radiation Oncology

## 2022-10-02 ENCOUNTER — Other Ambulatory Visit: Payer: Self-pay

## 2022-10-02 DIAGNOSIS — Z510 Encounter for antineoplastic radiation therapy: Secondary | ICD-10-CM | POA: Diagnosis not present

## 2022-10-02 DIAGNOSIS — Z171 Estrogen receptor negative status [ER-]: Secondary | ICD-10-CM | POA: Diagnosis not present

## 2022-10-02 DIAGNOSIS — D0512 Intraductal carcinoma in situ of left breast: Secondary | ICD-10-CM | POA: Diagnosis not present

## 2022-10-02 LAB — RAD ONC ARIA SESSION SUMMARY
Course Elapsed Days: 34
Plan Fractions Treated to Date: 8
Plan Prescribed Dose Per Fraction: 2 Gy
Plan Total Fractions Prescribed: 8
Plan Total Prescribed Dose: 16 Gy
Reference Point Dosage Given to Date: 16 Gy
Reference Point Session Dosage Given: 2 Gy
Session Number: 24

## 2022-10-11 ENCOUNTER — Encounter: Payer: Self-pay | Admitting: Oncology

## 2022-10-16 ENCOUNTER — Encounter: Payer: Self-pay | Admitting: Oncology

## 2022-10-16 ENCOUNTER — Inpatient Hospital Stay (HOSPITAL_BASED_OUTPATIENT_CLINIC_OR_DEPARTMENT_OTHER): Payer: Medicare HMO | Admitting: Oncology

## 2022-10-16 ENCOUNTER — Inpatient Hospital Stay: Payer: Medicare HMO | Attending: Oncology

## 2022-10-16 VITALS — BP 145/68 | HR 94 | Temp 96.9°F | Wt 219.3 lb

## 2022-10-16 DIAGNOSIS — Z86010 Personal history of colonic polyps: Secondary | ICD-10-CM | POA: Diagnosis not present

## 2022-10-16 DIAGNOSIS — C679 Malignant neoplasm of bladder, unspecified: Secondary | ICD-10-CM

## 2022-10-16 DIAGNOSIS — D631 Anemia in chronic kidney disease: Secondary | ICD-10-CM | POA: Diagnosis not present

## 2022-10-16 DIAGNOSIS — Z86718 Personal history of other venous thrombosis and embolism: Secondary | ICD-10-CM | POA: Insufficient documentation

## 2022-10-16 DIAGNOSIS — N1832 Chronic kidney disease, stage 3b: Secondary | ICD-10-CM

## 2022-10-16 DIAGNOSIS — Z95828 Presence of other vascular implants and grafts: Secondary | ICD-10-CM | POA: Diagnosis not present

## 2022-10-16 DIAGNOSIS — C7910 Secondary malignant neoplasm of unspecified urinary organs: Secondary | ICD-10-CM

## 2022-10-16 DIAGNOSIS — M810 Age-related osteoporosis without current pathological fracture: Secondary | ICD-10-CM | POA: Diagnosis not present

## 2022-10-16 DIAGNOSIS — Z9221 Personal history of antineoplastic chemotherapy: Secondary | ICD-10-CM | POA: Diagnosis not present

## 2022-10-16 DIAGNOSIS — Z171 Estrogen receptor negative status [ER-]: Secondary | ICD-10-CM | POA: Diagnosis not present

## 2022-10-16 DIAGNOSIS — Z8551 Personal history of malignant neoplasm of bladder: Secondary | ICD-10-CM | POA: Diagnosis not present

## 2022-10-16 DIAGNOSIS — N184 Chronic kidney disease, stage 4 (severe): Secondary | ICD-10-CM | POA: Diagnosis not present

## 2022-10-16 DIAGNOSIS — D0512 Intraductal carcinoma in situ of left breast: Secondary | ICD-10-CM | POA: Diagnosis not present

## 2022-10-16 DIAGNOSIS — I10 Essential (primary) hypertension: Secondary | ICD-10-CM | POA: Diagnosis not present

## 2022-10-16 DIAGNOSIS — E785 Hyperlipidemia, unspecified: Secondary | ICD-10-CM | POA: Diagnosis not present

## 2022-10-16 DIAGNOSIS — E119 Type 2 diabetes mellitus without complications: Secondary | ICD-10-CM | POA: Diagnosis not present

## 2022-10-16 LAB — FERRITIN: Ferritin: 149 ng/mL (ref 11–307)

## 2022-10-16 LAB — IRON AND TIBC
Iron: 63 ug/dL (ref 28–170)
Saturation Ratios: 19 % (ref 10.4–31.8)
TIBC: 326 ug/dL (ref 250–450)
UIBC: 263 ug/dL

## 2022-10-16 LAB — COMPREHENSIVE METABOLIC PANEL
ALT: 13 U/L (ref 0–44)
AST: 26 U/L (ref 15–41)
Albumin: 3.6 g/dL (ref 3.5–5.0)
Alkaline Phosphatase: 103 U/L (ref 38–126)
Anion gap: 9 (ref 5–15)
BUN: 30 mg/dL — ABNORMAL HIGH (ref 8–23)
CO2: 21 mmol/L — ABNORMAL LOW (ref 22–32)
Calcium: 8.5 mg/dL — ABNORMAL LOW (ref 8.9–10.3)
Chloride: 110 mmol/L (ref 98–111)
Creatinine, Ser: 1.64 mg/dL — ABNORMAL HIGH (ref 0.44–1.00)
GFR, Estimated: 33 mL/min — ABNORMAL LOW (ref >60)
Glucose, Bld: 120 mg/dL — ABNORMAL HIGH (ref 70–99)
Potassium: 4.1 mmol/L (ref 3.5–5.1)
Sodium: 140 mmol/L (ref 135–145)
Total Bilirubin: 0.3 mg/dL (ref 0.3–1.2)
Total Protein: 7.3 g/dL (ref 6.5–8.1)

## 2022-10-16 LAB — CBC WITH DIFFERENTIAL/PLATELET
Abs Immature Granulocytes: 0.02 10*3/uL (ref 0.00–0.07)
Basophils Absolute: 0 10*3/uL (ref 0.0–0.1)
Basophils Relative: 0 %
Eosinophils Absolute: 0.1 10*3/uL (ref 0.0–0.5)
Eosinophils Relative: 2 %
HCT: 31.3 % — ABNORMAL LOW (ref 36.0–46.0)
Hemoglobin: 9.5 g/dL — ABNORMAL LOW (ref 12.0–15.0)
Immature Granulocytes: 0 %
Lymphocytes Relative: 21 %
Lymphs Abs: 1 10*3/uL (ref 0.7–4.0)
MCH: 23.9 pg — ABNORMAL LOW (ref 26.0–34.0)
MCHC: 30.4 g/dL (ref 30.0–36.0)
MCV: 78.6 fL — ABNORMAL LOW (ref 80.0–100.0)
Monocytes Absolute: 0.4 10*3/uL (ref 0.1–1.0)
Monocytes Relative: 8 %
Neutro Abs: 3.4 10*3/uL (ref 1.7–7.7)
Neutrophils Relative %: 69 %
Platelets: 206 10*3/uL (ref 150–400)
RBC: 3.98 MIL/uL (ref 3.87–5.11)
RDW: 14.1 % (ref 11.5–15.5)
WBC: 4.9 10*3/uL (ref 4.0–10.5)
nRBC: 0 % (ref 0.0–0.2)

## 2022-10-16 LAB — LACTATE DEHYDROGENASE: LDH: 145 U/L (ref 98–192)

## 2022-10-16 MED ORDER — HEPARIN SOD (PORK) LOCK FLUSH 100 UNIT/ML IV SOLN
500.0000 [IU] | Freq: Once | INTRAVENOUS | Status: AC
  Administered 2022-10-16: 500 [IU] via INTRAVENOUS
  Filled 2022-10-16: qty 5

## 2022-10-16 MED ORDER — SODIUM CHLORIDE 0.9% FLUSH
10.0000 mL | Freq: Once | INTRAVENOUS | Status: AC
  Administered 2022-10-16: 10 mL via INTRAVENOUS
  Filled 2022-10-16: qty 10

## 2022-10-16 NOTE — Assessment & Plan Note (Addendum)
#  Metastatic urothelial carcinoma of bladder, sarcomatoid features pelvic sidewall mass biopsy showed metastatic carcinoma consistent with involvement by urothelial carcinoma., Stage IV NED- since 01/2020-patient was on Keytruda every 3 to 4 weeks for 2.5 years.  Shared decision was made to stop treatment and continued surveillance. Labs reviewed and discussed with patient Stable CT abdomen and pelvis without contrast- NED Continue surveillance repeat CT in June 2024

## 2022-10-16 NOTE — Assessment & Plan Note (Signed)
DCIS, ER negative. S/p lumpectomy.  S/p adjuvant radiation.  Recommend annual mammogram surveillance.

## 2022-10-16 NOTE — Assessment & Plan Note (Signed)
Avoid nephrotoxin.,  Encourage hydration. 

## 2022-10-16 NOTE — Assessment & Plan Note (Signed)
Continue Port flush every 8 weeks  Discussed about option of medi port removal. She is undecided.

## 2022-10-16 NOTE — Assessment & Plan Note (Signed)
Hemoglobin has decreased.  Recommend patient to continue iron supplementation

## 2022-10-16 NOTE — Progress Notes (Signed)
Hematology/Oncology follow up note [phone] [phone]   Patient Care Team: Larae Grooms, NP as PCP - [facility] [place], Sheppard Plumber, OD (Optometry) Rickard Patience, MD as Consulting Physician (Hematology and Oncology) Lajean Manes, [facility] (Inactive) (Pharmacist) Hulen Luster, RN as Oncology Nurse Navigator  CHIEF COMPLAINTS/REASON FOR VISIT:  Follow up for bladder cancer, anemia due to CKD, ER negative DCIS  ASSESSMENT & PLAN:   Bladder carcinoma [facility]) #Metastatic urothelial carcinoma of bladder, sarcomatoid features pelvic sidewall mass biopsy showed metastatic carcinoma consistent with involvement by urothelial carcinoma., Stage IV NED- since 01/2020-patient was on Keytruda every 3 to 4 weeks for 2.5 years.  Shared decision was made to stop treatment and continued surveillance. Labs reviewed and discussed with patient Stable CT abdomen and pelvis without contrast- NED Continue surveillance repeat CT in June 2024  Ductal carcinoma in situ (DCIS) of left breast DCIS, ER negative. S/p lumpectomy.  S/p adjuvant radiation.  Recommend annual mammogram surveillance.   Anemia in stage 4 chronic kidney disease (HCC) Hemoglobin has decreased.  Recommend patient to continue iron supplementation    CKD (chronic kidney disease) Avoid nephrotoxin.,  Encourage hydration.  Port-A-Cath in place Continue Port flush every 8 weeks  Discussed about option of medi port removal. She is undecided.    Orders Placed This Encounter  Procedures   CT CHEST ABDOMEN PELVIS WO CONTRAST    Standing Status:   Future    Standing Expiration Date:   10/16/2023    Order Specific Question:   Preferred imaging location?    Answer:   [facility]    Order Specific Question:   If indicated for the ordered procedure, I authorize the administration of oral contrast media per Radiology protocol    Answer:   Yes    Order Specific Question:   Does the patient have a contrast media/X-ray dye  allergy?    Answer:   No   CBC with Differential (Cancer Center Only)    Standing Status:   Future    Standing Expiration Date:   10/16/2023   CMP (Cancer Center only)    Standing Status:   Future    Standing Expiration Date:   10/16/2023   Lactate dehydrogenase    Standing Status:   Future    Standing Expiration Date:   10/16/2023   Iron and TIBC    Standing Status:   Future    Standing Expiration Date:   10/16/2023   Ferritin    Standing Status:   Future    Standing Expiration Date:   10/16/2023   Retic Panel    Standing Status:   Future    Standing Expiration Date:   10/16/2023   Follow-up 4 months All questions were answered. The patient knows to call the clinic with any problems, questions or concerns.  Rickard Patience, MD, PhD [facility] Hematology Oncology 10/16/2022      HISTORY OF PRESENTING ILLNESS:  Oncology History  Bladder carcinoma (HCC)  07/01/2018 Imaging   CT renal stone study showed suspected irregular wall thickening about the superior bladder, not well assessed due to degree of bladder distention. Recommend cystoscopy for further evaluation    07/03/2018 Imaging   another CT abdomen pelvis with contrast was done which showed no nephrolithiasis or hydronephrosis is identified. Bladder is decompressed limiting evaluation.    07/16/2018 Initial Diagnosis   urology Dr. Richardo Hanks - cystoscopy and bilateral retrograde pyelogram on 07/16/2018.  Pyelogram did not show any filling defect or abnormalities.  No  hydronephrosis.  Ureteral orifice was not involved with tumor.  There is a large 5 cm posterior wall bladder tumor, bullous and sessile appearing.  Patient underwent TURBT.    Pathology: High-grade urothelial carcinoma, invasive into muscularis propria.  Lymphovascular invasion is present.  Carcinoma in situ is also identified.  Focal squamous differentiation is noted, areas of invasive carcinoma display pleomorphic/sarcomatoid changes.  T2b     08/04/2018 -  Chemotherapy    Neoadjuvant DD MVAC x 1 cycle, stopped due to intolerance and acute kidney failure.  Patient was sent to [facility] urology for evaluation of cystectomy   08/07/2018 Imaging   CT without contrast negative. 2 subpleural right upper lobe nodule 2 to 3 mm likely benign.    09/22/2018 Surgery    patient underwent a cystectomy, pathology pT3a N0 Mx.  11 lymph nodes were harvested and was all negative. Invasive urothelia carcinoma, high grade, with sarcomatoid features.    02/09/2019 -  Hospital Admission   Patient has had difficulties following up with [facility].  Presented emergency room on 02/09/2023 abdominal pain.  CT concerning for small bowel obstruction and increased size of right pelvic fluid collection concerning for cancer recurrence.  Right hydronephrosis to the level of pelvis and enlarged lymph nodes. Patient had JP drain placed with CT guidance to pelvic fluid collection and drained 400 cc amber fluid.  Culture was negative for growth of microorganisms and cytology was negative for malignancy.-JP drain was removed on the day of discharge. CT-guided core biopsy of pelvic lymph node adenopathy was attempted but not successful.  She also developed right lower extremity DVT provoked by transvenous biopsy.- Post biopsy acute thrombus in the right external iliac and common femoral vein.  Patient was recommended to start anticoagulation with Xarelto however due to the co-pay, patient is not able to afford the medication.   02/25/2019 Procedure   IR Transcaval pelvic mass biopsy at [facility] showed metastatic carcinoma, consistent with involvement by urothelial carcinoma. PD-L1-CPS 100%   03/16/2019 Imaging   03/16/2019 patient was found to have increased lower extremity swelling, it was found that she was actually taking Eliquis 2.5 mg twice daily.  Repeat right lower extremity ultrasound showed persistent extensive proximal right lower extremity DVT.  Anticoagulation regimen was changed to Eliquis 5 mg twice  daily.   03/20/2019 Imaging   PET showed FDG avid tissue in the cystectomy bed, retroperitoneal and pelvic lymphadenopathy. Right common iliac DVT.    03/30/2019 Procedure   Status post IVC filter placement, mechanical thrombectomy.-IVC filter was retrieved in January 2021.    03/31/2019 -  Chemotherapy   Keytruda every 3 weeks.   04/26/2020 Imaging   CT chest abdomen pelvis was reviewed and discussed with patient. No definitive finding of disease recurrence or metastasis.  Small amount of ascites similar to prior.There is a parastomal hernia containing a small margin of adjacent small bowel.  This is associated with wall thickening in approximately 7 cm segment of bowel adjacent to the hernia.  There is adjacent abnormal stranding of the mesentery and omentum.   08/19/2020 Imaging   CT chest abdomen pelvis showed no evidence of new/progressive metastatic disease. NED   # Chronic abdominal discomfort due to hernia. 08/11/2020, patient was seen by Dr. Lamonte Richer for parastomal hernia.  Patient has no obstructive symptoms and would like to continue to monitor her symptoms and hold off any intervention at this point.  Patient was recommended to wear ostomy hernia belt and was referred to [facility] wound ostomy nursing  team.   04/10/2021 Imaging   CT chest abdomen pelvis without contrast showed no evidence of disease recurrence. Small volume fluid in the pelvis, decreased. Emphysema/diffuse bilateral bronchial wall thickening. CAD.    10/02/2021 - 10/05/2021 Hospital Admission   atient was admitted due to sepsis secondary to UTI. Patient also had CT chest angiogram done during the hospitalization which showed no PE or pneumonia.. Lower extremity ultrasound showed negative for DVT. Patient was provided supportive care IV fluid hydration, broad-spectrum antibiotics. COVID and influenza negative. Blood culture positive for E. coli. Patient was transitioned to oral antibiotics Levaquin and completed 14 days of  course.    05/10/2022 Imaging   CT chest abdomen pelvis with contrast showed 1. No findings of active malignancy in the chest, abdomen, or pelvis. Stable perivascular density along the pelvic sidewalls likely from previous treated malignancy. 2. Prior cystectomy and ileal conduit. The ileal conduit is no longer as prominent in size as it was on 11/03/2021. There is some borderline hydroureter of the right mid ureter but no hydronephrosis. 3. Peristomal hernia along the urostomy site contains a loop of distal small bowel. No findings of strangulation or obstruction. 4. Aberrant right subclavian artery passes behind the esophagus.5. Degenerative arthropathy of both hips. Grade 1 degenerative anterolisthesis of L5 on S1. 6. Aortic atherosclerosis.   Metastatic urothelial carcinoma (HCC)  03/23/2019 Initial Diagnosis   Metastatic urothelial carcinoma (HCC)   03/31/2019 - 09/05/2021 Chemotherapy   Patient is on Treatment Plan : Bladder - Pembrolizumab Q28D     Ductal carcinoma in situ (DCIS) of left breast  04/23/2022 Mammogram   Bilateral screening mammogram showed left breast calcifications warrant further evaluation.  Right breast no findings suspicious for malignancy.   06/01/2022 Mammogram   Unilateral diagnostic mammogram showed 2 groups of indeterminate calcifications in the left breast, 1 in the upper outer left breast spanning 1.3 cm and another in the central inferior left breast spanning 1.2 cm.   06/13/2022 Cancer Staging   Staging form: Breast, AJCC 8th Edition - Clinical stage from 06/13/2022: Stage 0 (cTis (DCIS), cN0, cM0, G3, ER: Unknown, PR: Not Assessed, HER2: Not Assessed) - Signed by Rickard Patience, MD on 06/26/2022 Stage prefix: Initial diagnosis Nuclear grade: G3 Histologic grading system: 3 grade system   06/13/2022 Initial Diagnosis   Ductal carcinoma in situ (DCIS) of left breast  06/13/2022, patient underwent left breast medial and lateral biopsy. Medial biopsy showed  DCIS, grade 3, focal comedo type necrosis present.  Calcifications identified. Left lateral biopsy showed breast parenchyma with focal fibrosis.  No significant atypia, calcifications identified.  Menarche 71 to 72 years of age History of hysterectomy in 2000. Previous use of OCP many years ago.  She is not able to recall how many years. Denies any hormone replacement therapy.  Denies previous chest radiation. Denies any family history of breast cancer.   07/17/2022 Surgery   S/p left breast lumpectomy  Pathology showed DCIS, Grade 3, all margins negative for DCIS ER negative [<1%]      INTERVAL HISTORY Angela Russell is a 72 y.o. female who has above history reviewed by me today presents for evaluation of newly diagnosed left breast DCIS. Patient is known to me for history of metastatic high-grade urothelial carcinoma of the bladder, sarcomatoid feature, currently in remission. DCIS s/p lumpectomy. ER negative.      Review of Systems  Constitutional:  Negative for appetite change, chills, fatigue and fever.  HENT:   Negative for hearing loss and voice  change.   Eyes:  Negative for eye problems.  Respiratory:  Negative for chest tightness and cough.   Cardiovascular:  Negative for chest pain and leg swelling.  Gastrointestinal:  Negative for abdominal distention, abdominal pain, blood in stool and constipation.  Endocrine: Negative for hot flashes.  Genitourinary:  Negative for difficulty urinating and frequency.   Musculoskeletal:  Positive for arthralgias.  Skin:  Negative for itching and rash.  Neurological:  Positive for numbness. Negative for extremity weakness.  Hematological:  Negative for adenopathy.  Psychiatric/Behavioral:  Negative for confusion. The patient is not nervous/anxious.     MEDICAL HISTORY:  Past Medical History:  Diagnosis Date   Bladder carcinoma (HCC)    Carotid artery plaque, right 01/2014   CKD (chronic kidney disease)    stage 4-followed by  nephrology   Controlled diabetes mellitus type 2 with complications (HCC)    Diabetes mellitus without complication (HCC)    DM (diabetes mellitus), type 2, uncontrolled    Ductal carcinoma in situ (DCIS) of breast    DVT (deep venous thrombosis) (HCC)    Hyperlipidemia    Hypertension    Hypochromic microcytic anemia    mild   Metastatic urothelial carcinoma (HCC) 03/23/2019   Osteoporosis    Parastomal hernia    Personal history of colonic polyps    Port-A-Cath in place    right side   Renal insufficiency    Rotator cuff tendonitis, right    Sepsis (HCC)    UTI (urinary tract infection) 07/03/2022    SURGICAL HISTORY: Past Surgical History:  Procedure Laterality Date   BREAST BIOPSY Left 06/13/2022   stereo biopsy/x clip/ path pending   BREAST BIOPSY Left 06/13/2022   stereo biopsy/ ribbon clip/ path pending   BREAST BIOPSY Left 06/13/2022   MM LT BREAST BX W LOC DEV EA AD LESION IMG BX SPEC STEREO GUIDE 06/13/2022 [facility]   BREAST BIOPSY Left 06/13/2022   MM LT BREAST BX W LOC DEV 1ST LESION IMAGE BX SPEC STEREO GUIDE 06/13/2022 [facility]   BREAST LUMPECTOMY WITH RADIOFREQUENCY TAG IDENTIFICATION Left 07/17/2022   Procedure: BREAST LUMPECTOMY WITH RADIOFREQUENCY TAG IDENTIFICATION;  Surgeon: Henrene Dodge, MD;  Location: [facility] ORS;  Service: General;  Laterality: Left;   COLONOSCOPY WITH PROPOFOL N/A 03/17/2021   Procedure: COLONOSCOPY WITH PROPOFOL;  Surgeon: Wyline Mood, MD;  Location: [facility] ENDOSCOPY;  Service: Gastroenterology;  Laterality: N/A;   CYSTOSCOPY W/ RETROGRADES Bilateral 07/16/2018   Procedure: CYSTOSCOPY WITH RETROGRADE PYELOGRAM;  Surgeon: Sondra Come, MD;  Location: [facility] OR[facility];  Service: Urology;  Laterality: Bilateral;   ESOPHAGOGASTRODUODENOSCOPY  03/17/2021   Procedure: ESOPHAGOGASTRODUODENOSCOPY (EGD);  Surgeon: Wyline Mood, MD;  Location: [facility] ENDOSCOPY;  Service: Gastroenterology;;   GIVENS CAPSULE STUDY N/A 06/19/2021   Procedure:  GIVENS CAPSULE STUDY;  Surgeon: Wyline Mood, MD;  Location: [facility] ENDOSCOPY;  Service: Gastroenterology;  Laterality: N/A;   IVC FILTER INSERTION N/A 03/30/2019   Procedure: IVC FILTER INSERTION;  Surgeon: Annice Needy, MD;  Location: [facility] INVASIVE CV LAB;  Service: Cardiovascular;  Laterality: N/A;   IVC FILTER REMOVAL N/A 06/15/2019   Procedure: IVC FILTER REMOVAL;  Surgeon: Annice Needy, MD;  Location: [facility] INVASIVE CV LAB;  Service: Cardiovascular;  Laterality: N/A;   KNEE SURGERY Left 03/17/2013   torn meniscus   PERIPHERAL VASCULAR THROMBECTOMY Right 03/30/2019   Procedure: PERIPHERAL VASCULAR THROMBECTOMY;  Surgeon: Annice Needy, MD;  Location: [facility] INVASIVE CV LAB;  Service: Cardiovascular;  Laterality: Right;   PORTA CATH INSERTION  N/A 08/06/2018   Procedure: PORTA CATH INSERTION;  Surgeon: Annice Needy, MD;  Location: [facility] INVASIVE CV LAB;  Service: Cardiovascular;  Laterality: N/A;   TOTAL ABDOMINAL HYSTERECTOMY  2000   due to bleeding and fibroids, partial- still has ovaries   TRANSURETHRAL RESECTION OF BLADDER TUMOR N/A 07/16/2018   Procedure: TRANSURETHRAL RESECTION OF BLADDER TUMOR (TURBT);  Surgeon: Sondra Come, MD;  Location: [facility] ORS;  Service: Urology;  Laterality: N/A;    SOCIAL HISTORY: Social History   Socioeconomic History   Marital status: Single    Spouse name: Not on file   Number of children: 1   Years of education: Not on file   Highest education level: 10th grade  Occupational History   Not on file  Tobacco Use   Smoking status: Former    Packs/day: 1.00    Years: 10.00    Additional pack years: 0.00    Total pack years: 10.00    Types: Cigarettes    Quit date: 06/05/1991    Years since quitting: 31.3   Smokeless tobacco: Never  Vaping Use   Vaping Use: Never used  Substance and Sexual Activity   Alcohol use: No   Drug use: No   Sexual activity: Not Currently  Other Topics Concern   Not on file  Social History Narrative   Working full time    Social Determinants of Health   Financial Resource Strain: Low Risk  (10/02/2020)   Overall Financial Resource Strain (CARDIA)    Difficulty of Paying Living Expenses: Not hard at all  Food Insecurity: No Food Insecurity (09/06/2017)   Hunger Vital Sign    Worried About Running Out of Food in the Last Year: Never true    Ran Out of Food in the Last Year: Never true  Transportation Needs: No Transportation Needs (09/06/2017)   PRAPARE - Administrator, Civil Service (Medical): No    Lack of Transportation (Non-Medical): No  Physical Activity: Insufficiently Active (09/06/2017)   Exercise Vital Sign    Days of Exercise per Week: 2 days    Minutes of Exercise per Session: 30 min  Stress: No Stress Concern Present (09/06/2017)   [employer] of Occupational Health - Occupational Stress Questionnaire    Feeling of Stress : Not at all  Social Connections: Moderately Integrated (09/06/2017)   Social Connection and Isolation Panel [NHANES]    Frequency of Communication with Friends and Family: More than three times a week    Frequency of Social Gatherings with Friends and Family: More than three times a week    Attends Religious Services: More than 4 times per year    Active Member of [employer] or Organizations: Yes    Attends Banker Meetings: More than 4 times per year    Marital Status: Separated  Intimate Partner Violence: Not At Risk (09/06/2017)   Humiliation, Afraid, Rape, and Kick questionnaire    Fear of Current or Ex-Partner: No    Emotionally Abused: No    Physically Abused: No    Sexually Abused: No    FAMILY HISTORY: Family History  Problem Relation Age of Onset   Heart disease Mother    Heart attack Mother    Arthritis Father    Diabetes Brother    Breast cancer Neg Hx     ALLERGIES:  has No Known Allergies.  MEDICATIONS:  Current Outpatient Medications  Medication Sig Dispense Refill   acetaminophen (TYLENOL) 500 MG tablet Take 2 tablets (  1,000  mg total) by mouth every 6 (six) hours as needed for mild pain.     amLODipine (NORVASC) 10 MG tablet Take 1 tablet (10 mg total) by mouth daily. 90 tablet 1   bisacodyl (DULCOLAX) 5 MG EC tablet Take 1 tablet (5 mg total) by mouth daily as needed for moderate constipation. 30 tablet 0   docusate sodium (COLACE) 100 MG capsule Take 1 capsule (100 mg total) by mouth daily. (Patient taking differently: Take 100 mg by mouth daily as needed.) 90 capsule 0   losartan (COZAAR) 25 MG tablet Take 1 tablet (25 mg total) by mouth daily. 90 tablet 1   Omega-3 Fatty Acids (FISH OIL PO) Take 1 tablet by mouth daily at 6 (six) AM.     polyethylene glycol (MIRALAX / GLYCOLAX) 17 g packet Take 17 g by mouth 2 (two) times daily. 14 each 0   Semaglutide (RYBELSUS) 7 MG TABS Take 1 tablet (7 mg total) by mouth daily. 90 tablet 1   No current facility-administered medications for this visit.   Facility-Administered Medications Ordered in Other Visits  Medication Dose Route Frequency Provider Last Rate Last Admin   heparin lock flush 100 UNIT/ML injection              PHYSICAL EXAMINATION: ECOG PERFORMANCE STATUS: 1 - Symptomatic but completely ambulatory Vitals:   10/16/22 1415  BP: (!) 145/68  Pulse: 94  Temp: (!) 96.9 F (36.1 C)  SpO2: 97%   Filed Weights   10/16/22 1415  Weight: 219 lb 4.8 oz (99.5 kg)    Physical Exam Constitutional:      General: She is not in acute distress.    Appearance: She is obese.     Comments: Walk independently  HENT:     Head: Normocephalic and atraumatic.  Eyes:     General: No scleral icterus.    Pupils: Pupils are equal, round, and reactive to light.  Cardiovascular:     Rate and Rhythm: Normal rate and regular rhythm.     Heart sounds: Normal heart sounds.  Pulmonary:     Effort: Pulmonary effort is normal. No respiratory distress.     Breath sounds: No wheezing.  Abdominal:     General: Bowel sounds are normal. There is no distension.      Palpations: Abdomen is soft.     Comments: Ureterostomy,   Musculoskeletal:        General: No deformity. Normal range of motion.     Cervical back: Normal range of motion and neck supple.  Skin:    General: Skin is warm and dry.     Coloration: Skin is not pale.     Findings: No erythema or rash.  Neurological:     Mental Status: She is alert and oriented to person, place, and time. Mental status is at baseline.     Cranial Nerves: No cranial nerve deficit.     Coordination: Coordination normal.  Psychiatric:        Mood and Affect: Mood normal.   . LABORATORY DATA:  I have reviewed the data as listed    Latest Ref Rng & Units 10/16/2022    1:44 PM 08/24/2022    8:32 AM 07/03/2022    4:27 PM  CBC  WBC 4.0 - 10.5 K/uL 4.9  5.3  4.2   Hemoglobin 12.0 - 15.0 g/dL 9.5  16.1  09.6   Hematocrit 36.0 - 46.0 % 31.3  34.3  32.0   Platelets  150 - 400 K/uL 206  254  273       Latest Ref Rng & Units 10/16/2022    1:44 PM 08/24/2022    8:32 AM 07/03/2022    4:27 PM  CMP  Glucose 70 - 99 mg/dL 409  811  914   BUN 8 - 23 mg/dL 30  28  30    Creatinine 0.44 - 1.00 mg/dL 7.82  9.56  2.13   Sodium 135 - 145 mmol/L 140  147  141   Potassium 3.5 - 5.1 mmol/L 4.1  5.7  4.4   Chloride 98 - 111 mmol/L 110  108  105   CO2 22 - 32 mmol/L 21  23  19    Calcium 8.9 - 10.3 mg/dL 8.5  9.4  9.2   Total Protein 6.5 - 8.1 g/dL 7.3  7.2  7.2   Total Bilirubin 0.3 - 1.2 mg/dL 0.3  <0.8  0.2   Alkaline Phos 38 - 126 U/L 103  142  116   AST 15 - 41 U/L 26  21  17    ALT 0 - 44 U/L 13  12  11       Lab Results  Component Value Date   IRON 63 10/16/2022   TIBC 326 10/16/2022   FERRITIN 149 10/16/2022    RADIOGRAPHIC STUDIES: I have personally reviewed the radiological images as listed and agreed with the findings in the report. No results found.

## 2022-10-17 ENCOUNTER — Encounter: Payer: Self-pay | Admitting: *Deleted

## 2022-10-17 IMAGING — CT CT CHEST-ABD-PELV W/O CM
2 of 4 series · 13 of 36 positions shown, 15 images · non-contrast
Comparison: Multiple priors including most recent CT chest abdomen
pelvis January 06, 2020 and PET-CT September 01, 2019

CLINICAL DATA: Restaging metastatic bladder cancer. History of
cystectomy and neobladder surgery in September 2018.

EXAM:
CT CHEST, ABDOMEN AND PELVIS WITHOUT CONTRAST
TECHNIQUE: Multidetector CT imaging of the chest, abdomen and pelvis was
performed following the standard protocol without IV contrast.

[Series 2: axials cap 5.00 · axial · 0.81mm/px · z∈[+1388,+1858]mm · 10 of 116 slices shown, 12 images]
[im 11/116  mediastinal]
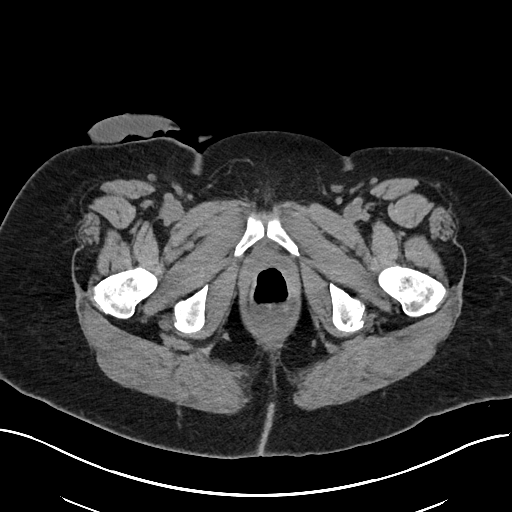
[im 11/116  bone]
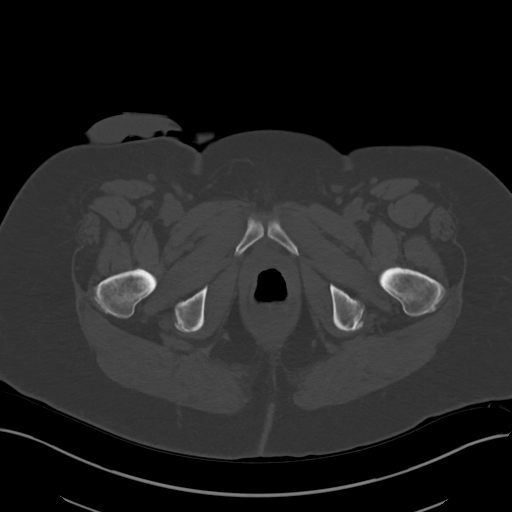
[im 21/116  mediastinal]
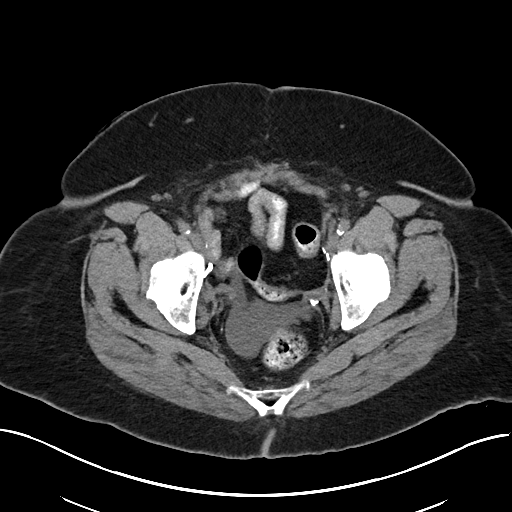
[im 32/116  mediastinal]
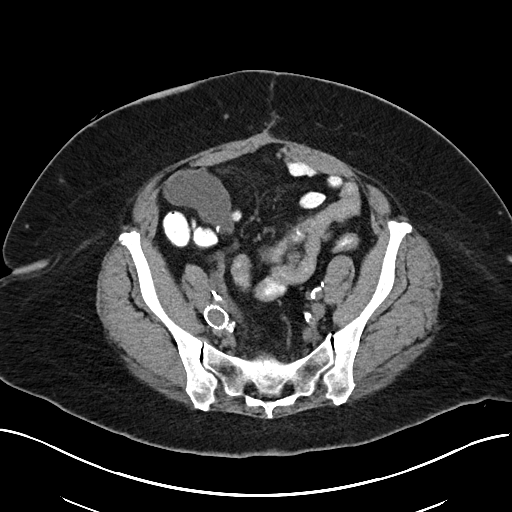
[im 42/116  mediastinal]
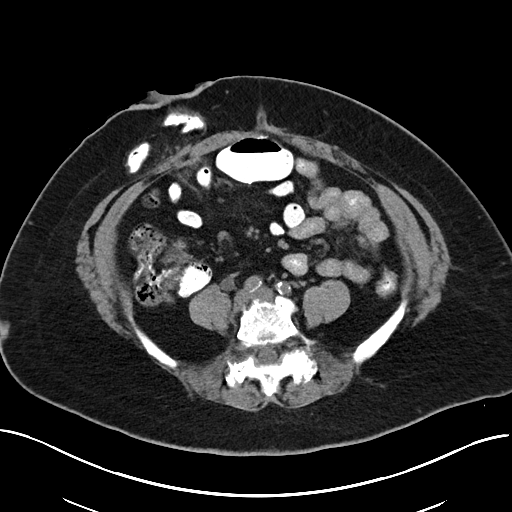
[im 53/116  mediastinal]
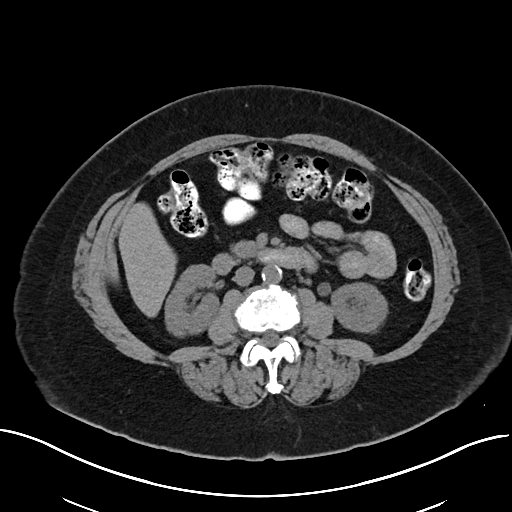
[im 63/116  mediastinal]
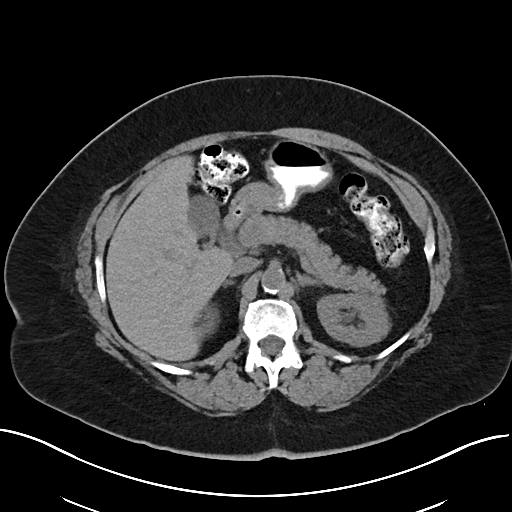
[im 74/116  mediastinal]
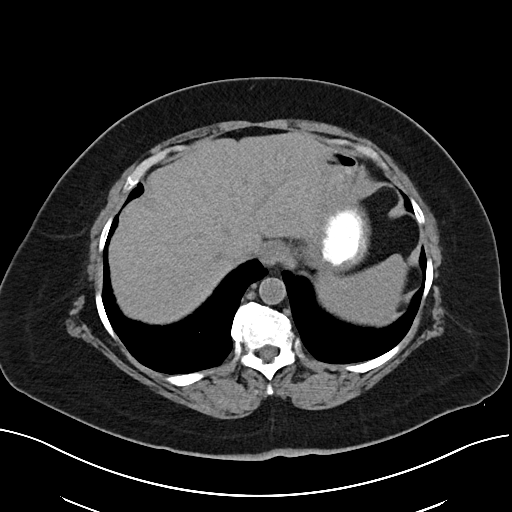
[im 84/116  mediastinal]
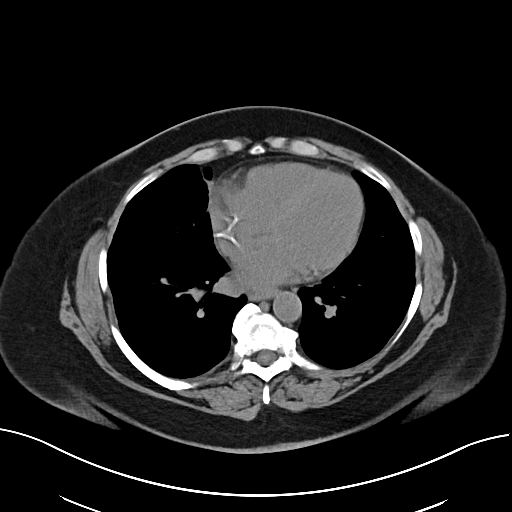
[im 95/116  mediastinal]
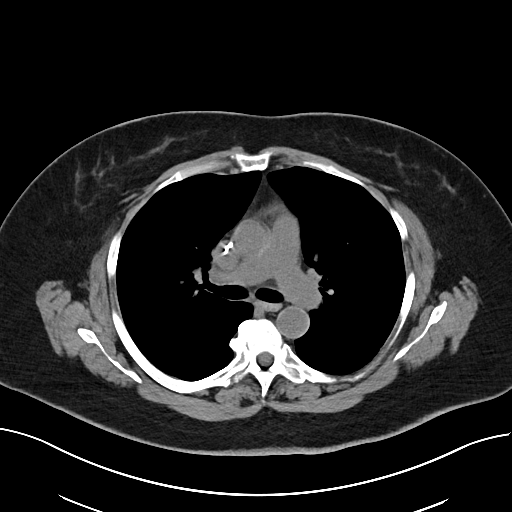
[im 95/116  bone]
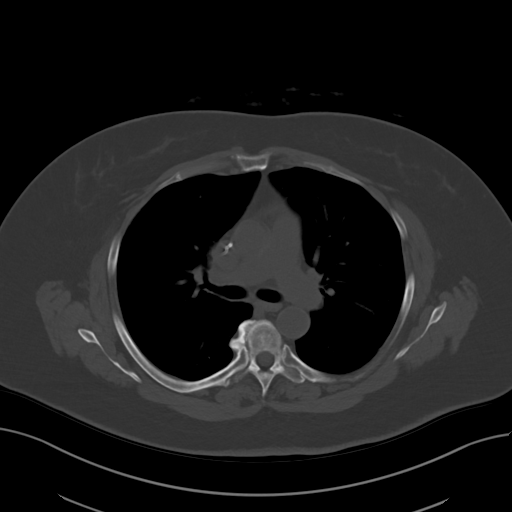
[im 105/116  mediastinal]
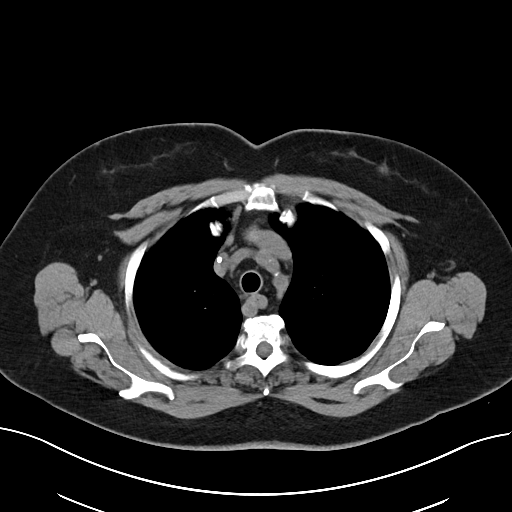

[Series 4: coronals cap 2.00 cor · coronal · 0.81mm/px · 3 of 159 slices shown]
[im 32/159  mediastinal]
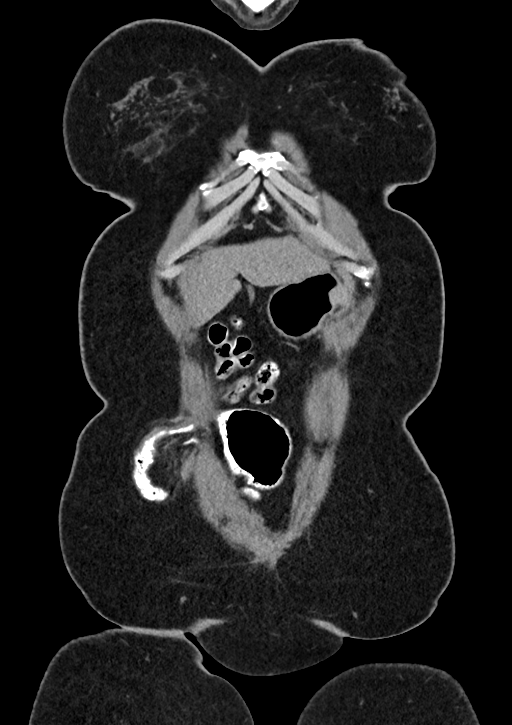
[im 64/159  mediastinal]
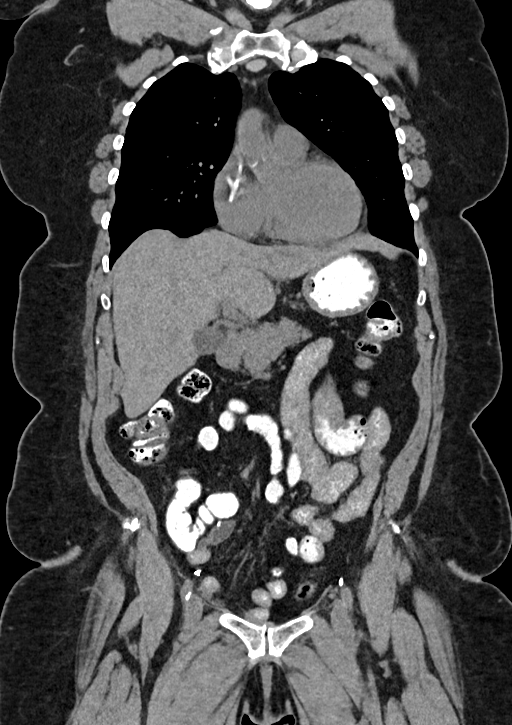
[im 95/159  mediastinal]
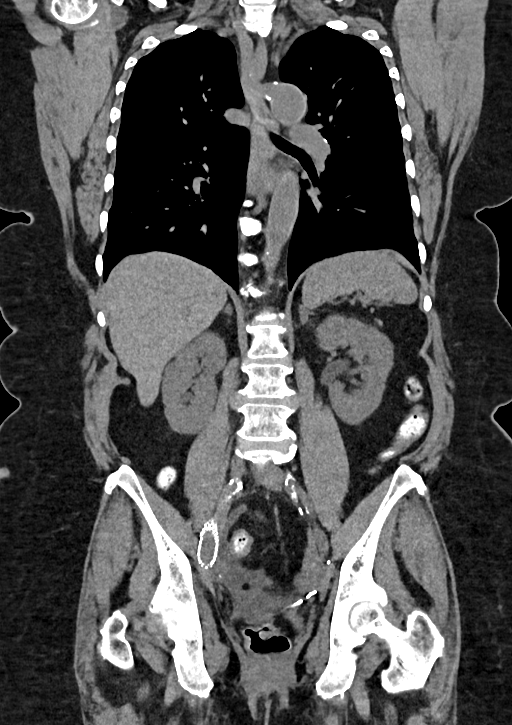

[13 of 36 positions shown; findings below may reference images not displayed]

FINDINGS: CT CHEST FINDINGS

Cardiovascular: Normal size heart. No significant pericardial
effusion/thickening. Coronary artery calcifications. Aortic
atherosclerosis. No thoracic aortic aneurysm.

Mediastinum/Nodes: Scattered prominent non pathologically enlarged
mediastinal and hilar lymph nodes are stable. No discrete thyroid
nodularity. Trachea esophagus appear within normal limits.

Lungs/Pleura: Stable emphysematous change in scattered pulmonary
scarring. No suspicious pulmonary nodules or masses. No pleural
effusion. No pneumothorax. Stable pericardial cyst in the right
inferior pulmonary vein.

Musculoskeletal: No acute osseous abnormality. No aggressive lytic
or blastic lesion of bone.

CT ABDOMEN PELVIS FINDINGS

Hepatobiliary: Unremarkable noncontrast appearance of the hepatic
parenchyma. Gallbladder is unremarkable. No biliary ductal dilation.

Pancreas: Unremarkable.

Spleen: Unremarkable.

Adrenals/Urinary Tract: The adrenal glands are unremarkable. Next.
No hydronephrosis. No nephrolithiasis. No contour deforming renal
lesions. Stable postsurgical changes cystectomy with urinary
diversion procedure with ileal conduit.

Stomach/Bowel: The stomach, duodenum, small bowel and colon are
grossly unremarkable. Normal appendix. Terminal ileum is
unremarkable. No acute inflammatory changes, mass lesions or
obstructive findings.

Vascular/Lymphatic: Stable advanced atherosclerotic calcifications
of the aorta and branch vessels. Right iliac artery stent is again
visualized. No pathologically enlarged mesenteric retroperitoneal or
pelvic lymph nodes. A few prominent scattered mesenteric lymph nodes
are again noted and appear stable in size.

Reproductive: Surgically absent.

Other: Small volume pelvic free fluid. Unchanged appearance of the
nodularity along the left pelvic sidewall surgical clips.

Musculoskeletal: No aggressive lytic or blastic lesion of bone.
Moderate lower lumbar degenerative change. Degenerative change of
the SI joints and bilateral hips.
IMPRESSION: 1. Stable postsurgical changes cystectomy with urinary diversion
procedure with ileal conduit.
2. No evidence of new or progressive metastatic disease within the
chest, abdomen, or pelvis.
3. Stable advanced atherosclerotic calcifications involving the
aorta and branch vessels.
4. Stable emphysematous change and pulmonary scarring.
5. Emphysema and aortic atherosclerosis.

Aortic Atherosclerosis (N444G-H55.5) and Emphysema (N444G-JFD.1).

## 2022-11-05 ENCOUNTER — Encounter: Payer: Self-pay | Admitting: Radiation Oncology

## 2022-11-05 ENCOUNTER — Ambulatory Visit
Admission: RE | Admit: 2022-11-05 | Discharge: 2022-11-05 | Disposition: A | Discharge status: Home / Self Care | Payer: Medicare HMO | Source: Ambulatory Visit | Attending: Radiation Oncology | Admitting: Radiation Oncology

## 2022-11-05 VITALS — Temp 97.8°F | Resp 14 | Ht 63.5 in | Wt 218.4 lb

## 2022-11-05 DIAGNOSIS — Z171 Estrogen receptor negative status [ER-]: Secondary | ICD-10-CM | POA: Insufficient documentation

## 2022-11-05 DIAGNOSIS — Z923 Personal history of irradiation: Secondary | ICD-10-CM | POA: Insufficient documentation

## 2022-11-05 DIAGNOSIS — D0512 Intraductal carcinoma in situ of left breast: Secondary | ICD-10-CM | POA: Insufficient documentation

## 2022-11-05 NOTE — Progress Notes (Signed)
Radiation Oncology Follow up Note  Name: Angela Russell   Date:   11/05/2022 MRN:  [id number] DOB: [birth year]    This 72 y.o. 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  Seen today in routine follow-up she is doing well.  She specifically denies breast tenderness cough or bone pain.  She is not on endocrine therapy based on the ER negative nature of her disease..  COMPLICATIONS OF TREATMENT: none  FOLLOW UP COMPLIANCE: keeps appointments   PHYSICAL EXAM:  Temp 97.8 F (36.6 C)   Resp 14   Ht 5' 3.5" (1.613 m)   Wt 218 lb 6.4 oz (99.1 kg)   BMI 38.08 kg/m  Lungs are clear to A&P cardiac examination essentially unremarkable with regular rate and rhythm. No dominant mass or nodularity is noted in either breast in 2 positions examined. Incision is well-healed. No axillary or supraclavicular adenopathy is appreciated. Cosmetic result is excellent.  Patient does have some hyperpigmentation of the skin which I assured her will resolve over time.  Well-developed well-nourished patient in NAD. HEENT reveals PERLA, EOMI, discs not visualized.  Oral cavity is clear. No oral mucosal lesions are identified. Neck is clear without evidence of cervical or supraclavicular adenopathy. Lungs are clear to A&P. Cardiac examination is essentially unremarkable with regular rate and rhythm without murmur rub or thrill. Abdomen is benign with no organomegaly or masses noted. Motor sensory and DTR levels are equal and symmetric in the upper and lower extremities. Cranial nerves II through XII are grossly intact. Proprioception is intact. No peripheral adenopathy or edema is identified. No motor or sensory levels  are noted. Crude visual fields are within normal range.  RADIOLOGY RESULTS: No current films for review  PLAN: Present time patient is doing well 1 month out from whole breast radiation and pleased with her overall progress.  Of asked to see her back in 6 months for follow-up.  Patient is to call with any concerns.  I would like to take this opportunity to thank you for allowing me to participate in the care of your patient.Angela Miller, MD

## 2022-11-13 DIAGNOSIS — M13861 Other specified arthritis, right knee: Secondary | ICD-10-CM | POA: Diagnosis not present

## 2022-11-13 DIAGNOSIS — E119 Type 2 diabetes mellitus without complications: Secondary | ICD-10-CM | POA: Diagnosis not present

## 2022-11-13 DIAGNOSIS — M25561 Pain in right knee: Secondary | ICD-10-CM | POA: Diagnosis not present

## 2022-11-14 ENCOUNTER — Other Ambulatory Visit: Payer: Self-pay | Admitting: Physician Assistant

## 2022-11-14 ENCOUNTER — Ambulatory Visit
Admission: RE | Admit: 2022-11-14 | Discharge: 2022-11-14 | Disposition: A | Discharge status: Home / Self Care | Payer: Medicare HMO | Source: Ambulatory Visit | Attending: Physician Assistant | Admitting: Physician Assistant

## 2022-11-14 DIAGNOSIS — M25561 Pain in right knee: Secondary | ICD-10-CM | POA: Diagnosis not present

## 2022-11-14 DIAGNOSIS — M79604 Pain in right leg: Secondary | ICD-10-CM | POA: Diagnosis not present

## 2022-11-28 ENCOUNTER — Ambulatory Visit: Admission: RE | Admit: 2022-11-28 | Payer: Medicare HMO | Source: Ambulatory Visit

## 2022-12-11 ENCOUNTER — Inpatient Hospital Stay: Payer: Medicare HMO | Attending: Oncology

## 2022-12-11 DIAGNOSIS — C679 Malignant neoplasm of bladder, unspecified: Secondary | ICD-10-CM | POA: Diagnosis not present

## 2022-12-11 DIAGNOSIS — Z95828 Presence of other vascular implants and grafts: Secondary | ICD-10-CM

## 2022-12-11 MED ORDER — HEPARIN SOD (PORK) LOCK FLUSH 100 UNIT/ML IV SOLN
500.0000 [IU] | Freq: Once | INTRAVENOUS | Status: AC
  Administered 2022-12-11: 500 [IU] via INTRAVENOUS
  Filled 2022-12-11: qty 5

## 2022-12-11 MED ORDER — SODIUM CHLORIDE 0.9% FLUSH
10.0000 mL | Freq: Once | INTRAVENOUS | Status: AC
  Administered 2022-12-11: 10 mL via INTRAVENOUS
  Filled 2022-12-11: qty 10

## 2023-01-21 ENCOUNTER — Encounter: Payer: Self-pay | Admitting: Oncology

## 2023-01-28 ENCOUNTER — Ambulatory Visit: Payer: Medicare HMO | Admitting: Surgery

## 2023-02-19 ENCOUNTER — Inpatient Hospital Stay: Payer: Medicare HMO | Admitting: Oncology

## 2023-02-19 ENCOUNTER — Inpatient Hospital Stay: Payer: Medicare HMO | Attending: Oncology

## 2023-02-19 ENCOUNTER — Encounter: Payer: Self-pay | Admitting: Oncology

## 2023-02-19 VITALS — BP 158/73 | HR 87 | Temp 96.5°F | Resp 18 | Wt 221.0 lb

## 2023-02-19 DIAGNOSIS — N1832 Chronic kidney disease, stage 3b: Secondary | ICD-10-CM

## 2023-02-19 DIAGNOSIS — Z95828 Presence of other vascular implants and grafts: Secondary | ICD-10-CM

## 2023-02-19 DIAGNOSIS — Z923 Personal history of irradiation: Secondary | ICD-10-CM | POA: Insufficient documentation

## 2023-02-19 DIAGNOSIS — N184 Chronic kidney disease, stage 4 (severe): Secondary | ICD-10-CM

## 2023-02-19 DIAGNOSIS — E1122 Type 2 diabetes mellitus with diabetic chronic kidney disease: Secondary | ICD-10-CM | POA: Insufficient documentation

## 2023-02-19 DIAGNOSIS — N179 Acute kidney failure, unspecified: Secondary | ICD-10-CM

## 2023-02-19 DIAGNOSIS — Z79899 Other long term (current) drug therapy: Secondary | ICD-10-CM | POA: Insufficient documentation

## 2023-02-19 DIAGNOSIS — Z86000 Personal history of in-situ neoplasm of breast: Secondary | ICD-10-CM | POA: Insufficient documentation

## 2023-02-19 DIAGNOSIS — C7910 Secondary malignant neoplasm of unspecified urinary organs: Secondary | ICD-10-CM

## 2023-02-19 DIAGNOSIS — Z87891 Personal history of nicotine dependence: Secondary | ICD-10-CM | POA: Diagnosis not present

## 2023-02-19 DIAGNOSIS — D0512 Intraductal carcinoma in situ of left breast: Secondary | ICD-10-CM

## 2023-02-19 DIAGNOSIS — C679 Malignant neoplasm of bladder, unspecified: Secondary | ICD-10-CM

## 2023-02-19 DIAGNOSIS — I129 Hypertensive chronic kidney disease with stage 1 through stage 4 chronic kidney disease, or unspecified chronic kidney disease: Secondary | ICD-10-CM | POA: Insufficient documentation

## 2023-02-19 DIAGNOSIS — Z9221 Personal history of antineoplastic chemotherapy: Secondary | ICD-10-CM | POA: Insufficient documentation

## 2023-02-19 DIAGNOSIS — D631 Anemia in chronic kidney disease: Secondary | ICD-10-CM | POA: Diagnosis not present

## 2023-02-19 DIAGNOSIS — Z8551 Personal history of malignant neoplasm of bladder: Secondary | ICD-10-CM | POA: Insufficient documentation

## 2023-02-19 LAB — CMP (CANCER CENTER ONLY)
ALT: 16 U/L (ref 0–44)
AST: 22 U/L (ref 15–41)
Albumin: 3.7 g/dL (ref 3.5–5.0)
Alkaline Phosphatase: 111 U/L (ref 38–126)
Anion gap: 6 (ref 5–15)
BUN: 34 mg/dL — ABNORMAL HIGH (ref 8–23)
CO2: 20 mmol/L — ABNORMAL LOW (ref 22–32)
Calcium: 8.6 mg/dL — ABNORMAL LOW (ref 8.9–10.3)
Chloride: 110 mmol/L (ref 98–111)
Creatinine: 1.63 mg/dL — ABNORMAL HIGH (ref 0.44–1.00)
GFR, Estimated: 33 mL/min — ABNORMAL LOW (ref >60)
Glucose, Bld: 172 mg/dL — ABNORMAL HIGH (ref 70–99)
Potassium: 3.9 mmol/L (ref 3.5–5.1)
Sodium: 136 mmol/L (ref 135–145)
Total Bilirubin: 0.1 mg/dL — ABNORMAL LOW (ref 0.3–1.2)
Total Protein: 7.6 g/dL (ref 6.5–8.1)

## 2023-02-19 LAB — CBC WITH DIFFERENTIAL (CANCER CENTER ONLY)
Abs Immature Granulocytes: 0.1 10*3/uL — ABNORMAL HIGH (ref 0.00–0.07)
Basophils Absolute: 0 10*3/uL (ref 0.0–0.1)
Basophils Relative: 0 %
Eosinophils Absolute: 0.1 10*3/uL (ref 0.0–0.5)
Eosinophils Relative: 2 %
HCT: 31.2 % — ABNORMAL LOW (ref 36.0–46.0)
Hemoglobin: 9.4 g/dL — ABNORMAL LOW (ref 12.0–15.0)
Immature Granulocytes: 2 %
Lymphocytes Relative: 17 %
Lymphs Abs: 1 10*3/uL (ref 0.7–4.0)
MCH: 24 pg — ABNORMAL LOW (ref 26.0–34.0)
MCHC: 30.1 g/dL (ref 30.0–36.0)
MCV: 79.8 fL — ABNORMAL LOW (ref 80.0–100.0)
Monocytes Absolute: 0.4 10*3/uL (ref 0.1–1.0)
Monocytes Relative: 6 %
Neutro Abs: 4.5 10*3/uL (ref 1.7–7.7)
Neutrophils Relative %: 73 %
Platelet Count: 246 10*3/uL (ref 150–400)
RBC: 3.91 MIL/uL (ref 3.87–5.11)
RDW: 13.9 % (ref 11.5–15.5)
WBC Count: 6.1 10*3/uL (ref 4.0–10.5)
nRBC: 0 % (ref 0.0–0.2)

## 2023-02-19 LAB — RETIC PANEL
Immature Retic Fract: 10.1 % (ref 2.3–15.9)
RBC.: 3.87 MIL/uL (ref 3.87–5.11)
Retic Count, Absolute: 61.1 10*3/uL (ref 19.0–186.0)
Retic Ct Pct: 1.6 % (ref 0.4–3.1)
Reticulocyte Hemoglobin: 26.5 pg — ABNORMAL LOW (ref >27.9)

## 2023-02-19 LAB — IRON AND TIBC
Iron: 65 ug/dL (ref 28–170)
Saturation Ratios: 23 % (ref 10.4–31.8)
TIBC: 284 ug/dL (ref 250–450)
UIBC: 219 ug/dL

## 2023-02-19 LAB — FERRITIN: Ferritin: 146 ng/mL (ref 11–307)

## 2023-02-19 LAB — LACTATE DEHYDROGENASE: LDH: 150 U/L (ref 98–192)

## 2023-02-19 MED ORDER — SODIUM CHLORIDE 0.9% FLUSH
10.0000 mL | Freq: Once | INTRAVENOUS | Status: AC | PRN
  Administered 2023-02-19: 10 mL
  Filled 2023-02-19: qty 10

## 2023-02-19 MED ORDER — HEPARIN SOD (PORK) LOCK FLUSH 100 UNIT/ML IV SOLN
500.0000 [IU] | Freq: Once | INTRAVENOUS | Status: AC | PRN
  Administered 2023-02-19: 500 [IU]
  Filled 2023-02-19: qty 5

## 2023-02-19 NOTE — Assessment & Plan Note (Addendum)
#  Metastatic urothelial carcinoma of bladder, sarcomatoid features pelvic sidewall mass biopsy showed metastatic carcinoma consistent with involvement by urothelial carcinoma., Stage IV NED- since 01/2020-patient was on Keytruda every 3 to 4 weeks for 2.5 years.  Shared decision was made to stop treatment and continued surveillance. Labs reviewed and discussed with patient Stable CT abdomen and pelvis without contrast- NED Continue surveillance repeat CT, not done in June 2024- reschedule.

## 2023-02-19 NOTE — Assessment & Plan Note (Addendum)
Continue Port flush every 8 weeks  Discussed about option of medi port removal. She would like to keep the medi port for now.

## 2023-02-19 NOTE — Assessment & Plan Note (Addendum)
DCIS, ER negative. S/p lumpectomy.  S/p adjuvant radiation.  Recommend annual mammogram surveillance. - Nov 2024

## 2023-02-19 NOTE — Assessment & Plan Note (Signed)
Avoid nephrotoxin.,  Encourage hydration.

## 2023-02-19 NOTE — Progress Notes (Signed)
Hematology/Oncology follow up note [phone] [phone]   Patient Care Team: Larae Grooms, NP as PCP - General [place], Sheppard Plumber, OD (Optometry) Rickard Patience, MD as Consulting Physician (Hematology and Oncology) Lajean Manes, [facility] (Inactive) (Pharmacist) Hulen Luster, RN as Oncology Nurse Navigator  CHIEF COMPLAINTS/REASON FOR VISIT:  Follow up for bladder cancer, anemia due to CKD, ER negative DCIS  ASSESSMENT & PLAN:   Bladder carcinoma [facility]) #Metastatic urothelial carcinoma of bladder, sarcomatoid features pelvic sidewall mass biopsy showed metastatic carcinoma consistent with involvement by urothelial carcinoma., Stage IV NED- since 01/2020-patient was on Keytruda every 3 to 4 weeks for 2.5 years.  Shared decision was made to stop treatment and continued surveillance. Labs reviewed and discussed with patient Stable CT abdomen and pelvis without contrast- NED Continue surveillance repeat CT, not done in June 2024- reschedule.   Ductal carcinoma in situ (DCIS) of left breast DCIS, ER negative. S/p lumpectomy.  S/p adjuvant radiation.  Recommend annual mammogram surveillance. - Nov 2024  Anemia in stage 4 chronic kidney disease [facility]) Lab Results  Component Value Date   HGB 9.4 (L) 02/19/2023   TIBC 284 02/19/2023   IRONPCTSAT 23 02/19/2023   FERRITIN 146 02/19/2023    She declines IV Venofer due to fatigue  side effects. Hb is stable.  Recommend patient to continue iron supplementation    CKD (chronic kidney disease) Avoid nephrotoxin.,  Encourage hydration.  Port-A-Cath in place Continue Port flush every 8 weeks  Discussed about option of medi port removal. She would like to keep the medi port for now.    Orders Placed This Encounter  Procedures   [place] LIMITED ULTRASOUND INCLUDING AXILLA RIGHT BREAST    Standing Status:   Future    Standing Expiration Date:   02/19/2024    Order Specific Question:   Reason for Exam (SYMPTOM  OR DIAGNOSIS  REQUIRED)    Answer:   DCIS    Order Specific Question:   Preferred imaging location?    Answer:   [facility]   [place] LIMITED ULTRASOUND INCLUDING AXILLA LEFT BREAST     Standing Status:   Future    Standing Expiration Date:   02/19/2024    Order Specific Question:   Reason for Exam (SYMPTOM  OR DIAGNOSIS REQUIRED)    Answer:   DCIS    Order Specific Question:   Preferred imaging location?    Answer:   [facility]   MM 3D DIAGNOSTIC MAMMOGRAM BILATERAL BREAST    Standing Status:   Future    Standing Expiration Date:   02/19/2024    Order Specific Question:   Reason for Exam (SYMPTOM  OR DIAGNOSIS REQUIRED)    Answer:   DCIS    Order Specific Question:   Preferred imaging location?    Answer:   [facility]   CT CHEST ABDOMEN PELVIS WO CONTRAST    Standing Status:   Future    Standing Expiration Date:   02/19/2024    Order Specific Question:   Preferred imaging location?    Answer:   [facility]    Order Specific Question:   If indicated for the ordered procedure, I authorize the administration of oral contrast media per Radiology protocol    Answer:   Yes    Order Specific Question:   Does the patient have a contrast media/X-ray dye allergy?    Answer:   No   CBC with Differential (Cancer Center Only)    Standing Status:  Future    Standing Expiration Date:   02/19/2024   CMP (Cancer Center only)    Standing Status:   Future    Standing Expiration Date:   02/19/2024   Lactate dehydrogenase    Standing Status:   Future    Standing Expiration Date:   02/19/2024   Iron and TIBC    Standing Status:   Future    Standing Expiration Date:   02/19/2024   Ferritin    Standing Status:   Future    Standing Expiration Date:   02/19/2024   Retic Panel    Standing Status:   Future    Standing Expiration Date:   02/19/2024   Follow-up 4 months All questions were answered. The patient knows to call the clinic with any problems, questions or concerns.  Rickard Patience, MD,  PhD [facility] Hematology Oncology 02/19/2023      HISTORY OF PRESENTING ILLNESS:  Oncology History  Bladder carcinoma (HCC)  07/01/2018 Imaging   CT renal stone study showed suspected irregular wall thickening about the superior bladder, not well assessed due to degree of bladder distention. Recommend cystoscopy for further evaluation    07/03/2018 Imaging   another CT abdomen pelvis with contrast was done which showed no nephrolithiasis or hydronephrosis is identified. Bladder is decompressed limiting evaluation.    07/16/2018 Initial Diagnosis   urology Dr. Richardo Hanks - cystoscopy and bilateral retrograde pyelogram on 07/16/2018.  Pyelogram did not show any filling defect or abnormalities.  No hydronephrosis.  Ureteral orifice was not involved with tumor.  There is a large 5 cm posterior wall bladder tumor, bullous and sessile appearing.  Patient underwent TURBT.    Pathology: High-grade urothelial carcinoma, invasive into muscularis propria.  Lymphovascular invasion is present.  Carcinoma in situ is also identified.  Focal squamous differentiation is noted, areas of invasive carcinoma display pleomorphic/sarcomatoid changes.  T2b     08/04/2018 -  Chemotherapy   Neoadjuvant DD MVAC x 1 cycle, stopped due to intolerance and acute kidney failure.  Patient was sent to [facility] urology for evaluation of cystectomy   08/07/2018 Imaging   CT without contrast negative. 2 subpleural right upper lobe nodule 2 to 3 mm likely benign.    09/22/2018 Surgery    patient underwent a cystectomy, pathology pT3a N0 Mx.  11 lymph nodes were harvested and was all negative. Invasive urothelia carcinoma, high grade, with sarcomatoid features.    02/09/2019 -  Hospital Admission   Patient has had difficulties following up with [facility].  Presented emergency room on 02/09/2023 abdominal pain.  CT concerning for small bowel obstruction and increased size of right pelvic fluid collection concerning for cancer recurrence.   Right hydronephrosis to the level of pelvis and enlarged lymph nodes. Patient had JP drain placed with CT guidance to pelvic fluid collection and drained 400 cc amber fluid.  Culture was negative for growth of microorganisms and cytology was negative for malignancy.-JP drain was removed on the day of discharge. CT-guided core biopsy of pelvic lymph node adenopathy was attempted but not successful.  She also developed right lower extremity DVT provoked by transvenous biopsy.- Post biopsy acute thrombus in the right external iliac and common femoral vein.  Patient was recommended to start anticoagulation with Xarelto however due to the co-pay, patient is not able to afford the medication.   02/25/2019 Procedure   IR Transcaval pelvic mass biopsy at [facility] showed metastatic carcinoma, consistent with involvement by urothelial carcinoma. PD-L1-CPS 100%   03/16/2019 Imaging  03/16/2019 patient was found to have increased lower extremity swelling, it was found that she was actually taking Eliquis 2.5 mg twice daily.  Repeat right lower extremity ultrasound showed persistent extensive proximal right lower extremity DVT.  Anticoagulation regimen was changed to Eliquis 5 mg twice daily.   03/20/2019 Imaging   PET showed FDG avid tissue in the cystectomy bed, retroperitoneal and pelvic lymphadenopathy. Right common iliac DVT.    03/30/2019 Procedure   Status post IVC filter placement, mechanical thrombectomy.-IVC filter was retrieved in January 2021.    03/31/2019 -  Chemotherapy   Keytruda every 3 weeks.   04/26/2020 Imaging   CT chest abdomen pelvis was reviewed and discussed with patient. No definitive finding of disease recurrence or metastasis.  Small amount of ascites similar to prior.There is a parastomal hernia containing a small margin of adjacent small bowel.  This is associated with wall thickening in approximately 7 cm segment of bowel adjacent to the hernia.  There is adjacent abnormal  stranding of the mesentery and omentum.   08/19/2020 Imaging   CT chest abdomen pelvis showed no evidence of new/progressive metastatic disease. NED   # Chronic abdominal discomfort due to hernia. 08/11/2020, patient was seen by Dr. Lamonte Richer for parastomal hernia.  Patient has no obstructive symptoms and would like to continue to monitor her symptoms and hold off any intervention at this point.  Patient was recommended to wear ostomy hernia belt and was referred to [facility] wound ostomy nursing team.   04/10/2021 Imaging   CT chest abdomen pelvis without contrast showed no evidence of disease recurrence. Small volume fluid in the pelvis, decreased. Emphysema/diffuse bilateral bronchial wall thickening. CAD.    10/02/2021 - 10/05/2021 Hospital Admission   atient was admitted due to sepsis secondary to UTI. Patient also had CT chest angiogram done during the hospitalization which showed no PE or pneumonia.. Lower extremity ultrasound showed negative for DVT. Patient was provided supportive care IV fluid hydration, broad-spectrum antibiotics. COVID and influenza negative. Blood culture positive for E. coli. Patient was transitioned to oral antibiotics Levaquin and completed 14 days of course.    05/10/2022 Imaging   CT chest abdomen pelvis with contrast showed 1. No findings of active malignancy in the chest, abdomen, or pelvis. Stable perivascular density along the pelvic sidewalls likely from previous treated malignancy. 2. Prior cystectomy and ileal conduit. The ileal conduit is no longer as prominent in size as it was on 11/03/2021. There is some borderline hydroureter of the right mid ureter but no hydronephrosis. 3. Peristomal hernia along the urostomy site contains a loop of distal small bowel. No findings of strangulation or obstruction. 4. Aberrant right subclavian artery passes behind the esophagus.5. Degenerative arthropathy of both hips. Grade 1 degenerative anterolisthesis of L5 on S1. 6. Aortic  atherosclerosis.   Metastatic urothelial carcinoma (HCC)  03/23/2019 Initial Diagnosis   Metastatic urothelial carcinoma (HCC)   03/31/2019 - 09/05/2021 Chemotherapy   Patient is on Treatment Plan : Bladder - Pembrolizumab Q28D     Ductal carcinoma in situ (DCIS) of left breast  04/23/2022 Mammogram   Bilateral screening mammogram showed left breast calcifications warrant further evaluation.  Right breast no findings suspicious for malignancy.   06/01/2022 Mammogram   Unilateral diagnostic mammogram showed 2 groups of indeterminate calcifications in the left breast, 1 in the upper outer left breast spanning 1.3 cm and another in the central inferior left breast spanning 1.2 cm.   06/13/2022 Cancer Staging   Staging form: Breast, AJCC  8th Edition - Clinical stage from 06/13/2022: Stage 0 (cTis (DCIS), cN0, cM0, G3, ER: Unknown, PR: Not Assessed, HER2: Not Assessed) - Signed by Rickard Patience, MD on 06/26/2022 Stage prefix: Initial diagnosis Nuclear grade: G3 Histologic grading system: 3 grade system   06/13/2022 Initial Diagnosis   Ductal carcinoma in situ (DCIS) of left breast  06/13/2022, patient underwent left breast medial and lateral biopsy. Medial biopsy showed DCIS, grade 3, focal comedo type necrosis present.  Calcifications identified. Left lateral biopsy showed breast parenchyma with focal fibrosis.  No significant atypia, calcifications identified.  Menarche 38 to 72 years of age History of hysterectomy in 2000. Previous use of OCP many years ago.  She is not able to recall how many years. Denies any hormone replacement therapy.  Denies previous chest radiation. Denies any family history of breast cancer.   07/17/2022 Surgery   S/p left breast lumpectomy  Pathology showed DCIS, Grade 3, all margins negative for DCIS ER negative [<1%]   09/21/2022 - 10/02/2022 Radiation Therapy   S/p adjuvant breast radiation.       INTERVAL HISTORY Angela Russell is a 72 y.o. female who  has above history reviewed by me today presents for evaluation of newly diagnosed left breast DCIS. Patient is known to me for history of metastatic high-grade urothelial carcinoma of the bladder, sarcomatoid feature, currently in remission.  She reports doing well currently. Some residual soreness of left breast since surgery. No new breast concerns.  + weight gain.   Review of Systems  Constitutional:  Negative for appetite change, chills, fatigue and fever.  HENT:   Negative for hearing loss and voice change.   Eyes:  Negative for eye problems.  Respiratory:  Negative for chest tightness and cough.   Cardiovascular:  Negative for chest pain and leg swelling.  Gastrointestinal:  Negative for abdominal distention, abdominal pain, blood in stool and constipation.  Endocrine: Negative for hot flashes.  Genitourinary:  Negative for difficulty urinating and frequency.   Musculoskeletal:  Positive for arthralgias.  Skin:  Negative for itching and rash.  Neurological:  Positive for numbness. Negative for extremity weakness.  Hematological:  Negative for adenopathy.  Psychiatric/Behavioral:  Negative for confusion. The patient is not nervous/anxious.     MEDICAL HISTORY:  Past Medical History:  Diagnosis Date   Bladder carcinoma (HCC)    Carotid artery plaque, right 01/2014   CKD (chronic kidney disease)    stage 4-followed by nephrology   Controlled diabetes mellitus type 2 with complications (HCC)    Diabetes mellitus without complication (HCC)    DM (diabetes mellitus), type 2, uncontrolled    Ductal carcinoma in situ (DCIS) of breast    DVT (deep venous thrombosis) (HCC)    Hyperlipidemia    Hypertension    Hypochromic microcytic anemia    mild   Metastatic urothelial carcinoma (HCC) 03/23/2019   Osteoporosis    Parastomal hernia    Personal history of colonic polyps    Port-A-Cath in place    right side   Renal insufficiency    Rotator cuff tendonitis, right    Sepsis  (HCC)    UTI (urinary tract infection) 07/03/2022    SURGICAL HISTORY: Past Surgical History:  Procedure Laterality Date   BREAST BIOPSY Left 06/13/2022   stereo biopsy/x clip/ path pending   BREAST BIOPSY Left 06/13/2022   stereo biopsy/ ribbon clip/ path pending   BREAST BIOPSY Left 06/13/2022   MM LT BREAST BX W LOC DEV EA AD  LESION IMG BX SPEC STEREO GUIDE 06/13/2022 [facility]   BREAST BIOPSY Left 06/13/2022   MM LT BREAST BX W LOC DEV 1ST LESION IMAGE BX SPEC STEREO GUIDE 06/13/2022 [facility]   BREAST LUMPECTOMY WITH RADIOFREQUENCY TAG IDENTIFICATION Left 07/17/2022   Procedure: BREAST LUMPECTOMY WITH RADIOFREQUENCY TAG IDENTIFICATION;  Surgeon: Henrene Dodge, MD;  Location: [facility] ORS;  Service: General;  Laterality: Left;   COLONOSCOPY WITH PROPOFOL N/A 03/17/2021   Procedure: COLONOSCOPY WITH PROPOFOL;  Surgeon: Wyline Mood, MD;  Location: [facility] ENDOSCOPY;  Service: Gastroenterology;  Laterality: N/A;   CYSTOSCOPY W/ RETROGRADES Bilateral 07/16/2018   Procedure: CYSTOSCOPY WITH RETROGRADE PYELOGRAM;  Surgeon: Sondra Come, MD;  Location: [facility];  Service: Urology;  Laterality: Bilateral;   ESOPHAGOGASTRODUODENOSCOPY  03/17/2021   Procedure: ESOPHAGOGASTRODUODENOSCOPY (EGD);  Surgeon: Wyline Mood, MD;  Location: [facility] ENDOSCOPY;  Service: Gastroenterology;;   GIVENS CAPSULE STUDY N/A 06/19/2021   Procedure: GIVENS CAPSULE STUDY;  Surgeon: Wyline Mood, MD;  Location: [facility] ENDOSCOPY;  Service: Gastroenterology;  Laterality: N/A;   IVC FILTER INSERTION N/A 03/30/2019   Procedure: IVC FILTER INSERTION;  Surgeon: Annice Needy, MD;  Location: [facility] INVASIVE CV LAB;  Service: Cardiovascular;  Laterality: N/A;   IVC FILTER REMOVAL N/A 06/15/2019   Procedure: IVC FILTER REMOVAL;  Surgeon: Annice Needy, MD;  Location: [facility] IN[facility]ASIVE CV LAB;  Service: Cardiovascular;  Laterality: N/A;   KNEE SURGERY Left 03/17/2013   torn meniscus   PERIPHERAL VASCULAR THROMBECTOMY Right  03/30/2019   Procedure: PERIPHERAL VASCULAR THROMBECTOMY;  Surgeon: Annice Needy, MD;  Location: [facility] CV LAB;  Service: Cardiovascular;  Laterality: Right;   PORTA CATH INSERTION N/A 08/06/2018   Procedure: PORTA CATH INSERTION;  Surgeon: Annice Needy, MD;  Location: [facility] CV LAB;  Service: Cardiovascular;  Laterality: N/A;   TOTAL ABDOMINAL HYSTERECTOMY  2000   due to bleeding and fibroids, partial- still has ovaries   TRANSURETHRAL RESECTION OF BLADDER TUMOR N/A 07/16/2018   Procedure: TRANSURETHRAL RESECTION OF BLADDER TUMOR (TURBT);  Surgeon: Sondra Come, MD;  Location: [facility] ORS;  Service: Urology;  Laterality: N/A;    SOCIAL HISTORY: Social History   Socioeconomic History   Marital status: Single    Spouse name: Not on file   Number of children: 1   Years of education: Not on file   Highest education level: 10th grade  Occupational History   Not on file  Tobacco Use   Smoking status: Former    Current packs/day: 0.00    Average packs/day: 1 pack/day for 10.0 years (10.0 ttl pk-yrs)    Types: Cigarettes    Start date: 06/04/1981    Quit date: 06/05/1991    Years since quitting: 31.7   Smokeless tobacco: Never  Vaping Use   Vaping status: Never Used  Substance and Sexual Activity   Alcohol use: No   Drug use: No   Sexual activity: Not Currently  Other Topics Concern   Not on file  Social History Narrative   Working full time   Social Determinants of Health   Financial Resource Strain: Low Risk  (10/02/2020)   Overall Financial Resource Strain (CARDIA)    Difficulty of Paying Living Expenses: Not hard at all  Food Insecurity: No Food Insecurity (09/06/2017)   Hunger Vital Sign    Worried About Running Out of Food in the Last Year: Never true    Ran Out of Food in the Last Year: Never true  Transportation Needs: No Transportation Needs (09/06/2017)  PRAPARE - Administrator, Civil Service (Medical): No    Lack of Transportation  (Non-Medical): No  Physical Activity: Insufficiently Active (09/06/2017)   Exercise Vital Sign    Days of Exercise per Week: 2 days    Minutes of Exercise per Session: 30 min  Stress: No Stress Concern Present (09/06/2017)   [employer] of Occupational Health - Occupational Stress Questionnaire    Feeling of Stress : Not at all  Social Connections: Moderately Integrated (09/06/2017)   Social Connection and Isolation Panel [NHANES]    Frequency of Communication with Friends and Family: More than three times a week    Frequency of Social Gatherings with Friends and Family: More than three times a week    Attends Religious Services: More than 4 times per year    Active Member of [employer] or Organizations: Yes    Attends Banker Meetings: More than 4 times per year    Marital Status: Separated  Intimate Partner Violence: Not At Risk (09/06/2017)   Humiliation, Afraid, Rape, and Kick questionnaire    Fear of Current or Ex-Partner: No    Emotionally Abused: No    Physically Abused: No    Sexually Abused: No    FAMILY HISTORY: Family History  Problem Relation Age of Onset   Heart disease Mother    Heart attack Mother    Arthritis Father    Diabetes Brother    Breast cancer Neg Hx     ALLERGIES:  has No Known Allergies.  MEDICATIONS:  Current Outpatient Medications  Medication Sig Dispense Refill   acetaminophen (TYLENOL) 500 MG tablet Take 2 tablets (1,000 mg total) by mouth every 6 (six) hours as needed for mild pain.     amLODipine (NORVASC) 10 MG tablet Take 1 tablet (10 mg total) by mouth daily. 90 tablet 1   bisacodyl (DULCOLAX) 5 MG EC tablet Take 1 tablet (5 mg total) by mouth daily as needed for moderate constipation. 30 tablet 0   docusate sodium (COLACE) 100 MG capsule Take 1 capsule (100 mg total) by mouth daily. (Patient taking differently: Take 100 mg by mouth daily as needed.) 90 capsule 0   losartan (COZAAR) 25 MG tablet Take 1 tablet (25 mg total) by mouth  daily. 90 tablet 1   Omega-3 Fatty Acids (FISH OIL PO) Take 1 tablet by mouth daily at 6 (six) AM.     oxyCODONE (OXY IR/ROXICODONE) 5 MG immediate release tablet Take 1 tablet by mouth every 4 (four) hours as needed.     polyethylene glycol (MIRALAX / GLYCOLAX) 17 g packet Take 17 g by mouth 2 (two) times daily. 14 each 0   Semaglutide (RYBELSUS) 7 MG TABS Take 1 tablet (7 mg total) by mouth daily. 90 tablet 1   No current facility-administered medications for this visit.   Facility-Administered Medications Ordered in Other Visits  Medication Dose Route Frequency Provider Last Rate Last Admin   heparin lock flush 100 UNIT/ML injection              PHYSICAL EXAMINATION: ECOG PERFORMANCE STATUS: 1 - Symptomatic but completely ambulatory Vitals:   02/19/23 0949  BP: (!) 158/73  Pulse: 87  Resp: 18  Temp: (!) 96.5 F (35.8 C)  SpO2: 100%   Filed Weights   02/19/23 0949  Weight: 221 lb (100.2 kg)    Physical Exam Constitutional:      General: She is not in acute distress.    Appearance: She is  obese.     Comments: Walk independently  HENT:     Head: Normocephalic and atraumatic.  Eyes:     General: No scleral icterus.    Pupils: Pupils are equal, round, and reactive to light.  Cardiovascular:     Rate and Rhythm: Normal rate and regular rhythm.     Heart sounds: Normal heart sounds.  Pulmonary:     Effort: Pulmonary effort is normal. No respiratory distress.     Breath sounds: No wheezing.  Abdominal:     General: Bowel sounds are normal. There is no distension.     Palpations: Abdomen is soft.     Comments: Ureterostomy,   Musculoskeletal:        General: No deformity. Normal range of motion.     Cervical back: Normal range of motion and neck supple.  Skin:    General: Skin is warm and dry.     Coloration: Skin is not pale.     Findings: No erythema or rash.  Neurological:     Mental Status: She is alert and oriented to person, place, and time. Mental status is  at baseline.     Cranial Nerves: No cranial nerve deficit.     Coordination: Coordination normal.  Psychiatric:        Mood and Affect: Mood normal.   . LABORATORY DATA:  I have reviewed the data as listed    Latest Ref Rng & Units 02/19/2023    9:33 AM 10/16/2022    1:44 PM 08/24/2022    8:32 AM  CBC  WBC 4.0 - 10.5 K/uL 6.1  4.9  5.3   Hemoglobin 12.0 - 15.0 g/dL 9.4  9.5  53.6   Hematocrit 36.0 - 46.0 % 31.2  31.3  34.3   Platelets 150 - 400 K/uL 246  206  254       Latest Ref Rng & Units 02/19/2023    9:33 AM 10/16/2022    1:44 PM 08/24/2022    8:32 AM  CMP  Glucose 70 - 99 mg/dL 644  034  742   BUN 8 - 23 mg/dL 34  30  28   Creatinine 0.44 - 1.00 mg/dL 5.95  6.38  7.56   Sodium 135 - 145 mmol/L 136  140  147   Potassium 3.5 - 5.1 mmol/L 3.9  4.1  5.7   Chloride 98 - 111 mmol/L 110  110  108   CO2 22 - 32 mmol/L 20  21  23    Calcium 8.9 - 10.3 mg/dL 8.6  8.5  9.4   Total Protein 6.5 - 8.1 g/dL 7.6  7.3  7.2   Total Bilirubin 0.3 - 1.2 mg/dL 0.1  0.3  <4.3   Alkaline Phos 38 - 126 U/L 111  103  142   AST 15 - 41 U/L 22  26  21    ALT 0 - 44 U/L 16  13  12       Lab Results  Component Value Date   IRON 65 02/19/2023   TIBC 284 02/19/2023   FERRITIN 146 02/19/2023    RADIOGRAPHIC STUDIES: I have personally reviewed the radiological images as listed and agreed with the findings in the report. No results found.

## 2023-02-19 NOTE — Assessment & Plan Note (Addendum)
Lab Results  Component Value Date   HGB 9.4 (L) 02/19/2023   TIBC 284 02/19/2023   IRONPCTSAT 23 02/19/2023   FERRITIN 146 02/19/2023    She declines IV Venofer due to fatigue  side effects. Hb is stable.  Recommend patient to continue iron supplementation

## 2023-02-26 DIAGNOSIS — H524 Presbyopia: Secondary | ICD-10-CM | POA: Diagnosis not present

## 2023-02-26 DIAGNOSIS — H5203 Hypermetropia, bilateral: Secondary | ICD-10-CM | POA: Diagnosis not present

## 2023-02-26 DIAGNOSIS — Z7984 Long term (current) use of oral hypoglycemic drugs: Secondary | ICD-10-CM | POA: Diagnosis not present

## 2023-02-26 DIAGNOSIS — E119 Type 2 diabetes mellitus without complications: Secondary | ICD-10-CM | POA: Diagnosis not present

## 2023-02-26 DIAGNOSIS — H52223 Regular astigmatism, bilateral: Secondary | ICD-10-CM | POA: Diagnosis not present

## 2023-02-26 DIAGNOSIS — H2513 Age-related nuclear cataract, bilateral: Secondary | ICD-10-CM | POA: Diagnosis not present

## 2023-02-28 DIAGNOSIS — I10 Essential (primary) hypertension: Secondary | ICD-10-CM | POA: Diagnosis not present

## 2023-02-28 DIAGNOSIS — E119 Type 2 diabetes mellitus without complications: Secondary | ICD-10-CM | POA: Diagnosis not present

## 2023-02-28 DIAGNOSIS — Z0130 Encounter for examination of blood pressure without abnormal findings: Secondary | ICD-10-CM | POA: Diagnosis not present

## 2023-02-28 DIAGNOSIS — Z712 Person consulting for explanation of examination or test findings: Secondary | ICD-10-CM | POA: Diagnosis not present

## 2023-02-28 DIAGNOSIS — Z1389 Encounter for screening for other disorder: Secondary | ICD-10-CM | POA: Diagnosis not present

## 2023-02-28 DIAGNOSIS — Z0131 Encounter for examination of blood pressure with abnormal findings: Secondary | ICD-10-CM | POA: Diagnosis not present

## 2023-03-08 ENCOUNTER — Encounter: Payer: Medicare HMO | Admitting: Family Medicine

## 2023-03-09 ENCOUNTER — Other Ambulatory Visit: Payer: Self-pay | Admitting: Nurse Practitioner

## 2023-03-11 NOTE — Telephone Encounter (Signed)
OV needed for additional refills, 30 day supply given until OV can be scheduled.  Requested Prescriptions  Pending Prescriptions Disp Refills   losartan (COZAAR) 25 MG tablet [Pharmacy Med Name: Losartan Potassium 25 MG Oral Tablet] 30 tablet 0    Sig: Take 1 tablet by mouth once daily     Cardiovascular:  Angiotensin Receptor Blockers Failed - 03/09/2023  3:46 PM      Failed - Cr in normal range and within 180 days    Creatinine  Date Value Ref Range Status  02/19/2023 1.63 (H) 0.44 - 1.00 mg/dL Final         Failed - Last BP in normal range    BP Readings from Last 1 Encounters:  02/19/23 (!) 158/73         Failed - Valid encounter within last 6 months    Recent Outpatient Visits           6 months ago Primary hypertension   [place] [facility] Larae Grooms, NP   8 months ago Pre-op exam   [place] [facility] Larae Grooms, NP   1 year ago Encounter for annual wellness visit (AWV) in Medicare patient   [place] [facility] Larae Grooms, NP   1 year ago Hypertension associated with diabetes [facility])   [facility] Larae G[facility], NP   2 years ago Hypertension associated with diabetes [facility])   [place] [facility] Larae Grooms, NP              Passed - K in normal range and within 180 days    Potassium  Date Value Ref Range Status  02/19/2023 3.9 3.5 - 5.1 mmol/L Final         Passed - Patient is not pregnant       amLODipine (NORVASC) 10 MG tablet [Pharmacy Med Name: amLODIPine Besylate 10 MG Oral Tablet] 30 tablet 0    Sig: Take 1 tablet by mouth once daily     Cardiovascular: Calcium Channel Blockers 2 Failed - 03/09/2023  3:46 PM      Failed - Last BP in normal range    BP Readings from Last 1 Encounters:  02/19/23 (!) 158/73         Failed - Valid encounter within last 6 months    Recent Outpatient Visits           6 months ago Primary  hypertension   [place] [facility] Larae Grooms, NP   8 months ago Pre-op exam   [place] [facility] Larae Grooms, NP   1 year ago Encounter for annual wellness visit (AWV) in Medicare patient   [place] [facility] Larae Grooms, NP   1 year ago Hypertension associated with diabetes [facility])   [place] [facility] Larae Grooms, NP   2 years ago Hypertension associated with diabetes [facility])   [place] [facility] Larae Grooms, NP              Passed - Last Heart Rate in normal range    Pulse Readings from Last 1 Encounters:  02/19/23 87

## 2023-03-19 DIAGNOSIS — H2512 Age-related nuclear cataract, left eye: Secondary | ICD-10-CM | POA: Diagnosis not present

## 2023-03-19 DIAGNOSIS — H2511 Age-related nuclear cataract, right eye: Secondary | ICD-10-CM | POA: Diagnosis not present

## 2023-03-26 ENCOUNTER — Other Ambulatory Visit: Payer: Self-pay | Admitting: Nurse Practitioner

## 2023-03-27 DIAGNOSIS — H2511 Age-related nuclear cataract, right eye: Secondary | ICD-10-CM | POA: Diagnosis not present

## 2023-03-27 NOTE — Telephone Encounter (Signed)
D/C 08/29/22. Requested Prescriptions  Refused Prescriptions Disp Refills   RYBELSUS 3 MG TABS [Pharmacy Med Name: Rybelsus 3 MG Oral Tablet] 90 tablet 0    Sig: Take 1 tablet by mouth once daily     Off-Protocol Failed - 03/26/2023  6:52 AM      Failed - Medication not assigned to a protocol, review manually.      Passed - Valid encounter within last 12 months    Recent Outpatient Visits           7 months ago Primary hypertension   [place] [facility] Larae Grooms, NP   8 months ago Pre-op exam   [place] [facility] Larae Grooms, NP   1 year ago Encounter for annual wellness visit (AWV) in Medicare patient   [place] [facility] Larae Grooms, NP   1 year ago Hypertension associated with diabetes [facility])   [place] [facility] Larae Grooms, NP   2 years ago Hypertension associated with diabetes [facility])   [place] [facility] Larae Grooms, NP       Future Appointments             In 1 month Larae Grooms, NP [facility]

## 2023-03-28 ENCOUNTER — Encounter: Payer: Self-pay | Admitting: Ophthalmology

## 2023-04-03 NOTE — Discharge Instructions (Signed)

## 2023-04-04 ENCOUNTER — Encounter
Admission: RE | Disposition: A | Discharge status: Home / Self Care | Payer: Self-pay | Source: Home / Self Care | Attending: Ophthalmology

## 2023-04-04 ENCOUNTER — Encounter: Payer: Self-pay | Admitting: Ophthalmology

## 2023-04-04 ENCOUNTER — Ambulatory Visit: Payer: Medicare HMO | Admitting: Anesthesiology

## 2023-04-04 ENCOUNTER — Other Ambulatory Visit: Payer: Self-pay

## 2023-04-04 ENCOUNTER — Ambulatory Visit
Admission: RE | Admit: 2023-04-04 | Discharge: 2023-04-04 | Disposition: A | Discharge status: Home / Self Care | Payer: Medicare HMO | Attending: Ophthalmology | Admitting: Ophthalmology

## 2023-04-04 DIAGNOSIS — I129 Hypertensive chronic kidney disease with stage 1 through stage 4 chronic kidney disease, or unspecified chronic kidney disease: Secondary | ICD-10-CM | POA: Insufficient documentation

## 2023-04-04 DIAGNOSIS — D631 Anemia in chronic kidney disease: Secondary | ICD-10-CM | POA: Diagnosis not present

## 2023-04-04 DIAGNOSIS — Z79899 Other long term (current) drug therapy: Secondary | ICD-10-CM | POA: Insufficient documentation

## 2023-04-04 DIAGNOSIS — Z8551 Personal history of malignant neoplasm of bladder: Secondary | ICD-10-CM | POA: Diagnosis not present

## 2023-04-04 DIAGNOSIS — D509 Iron deficiency anemia, unspecified: Secondary | ICD-10-CM | POA: Insufficient documentation

## 2023-04-04 DIAGNOSIS — Z7984 Long term (current) use of oral hypoglycemic drugs: Secondary | ICD-10-CM | POA: Diagnosis not present

## 2023-04-04 DIAGNOSIS — E1136 Type 2 diabetes mellitus with diabetic cataract: Secondary | ICD-10-CM | POA: Diagnosis not present

## 2023-04-04 DIAGNOSIS — Z86718 Personal history of other venous thrombosis and embolism: Secondary | ICD-10-CM | POA: Diagnosis not present

## 2023-04-04 DIAGNOSIS — H2511 Age-related nuclear cataract, right eye: Secondary | ICD-10-CM | POA: Diagnosis not present

## 2023-04-04 DIAGNOSIS — Z87891 Personal history of nicotine dependence: Secondary | ICD-10-CM | POA: Diagnosis not present

## 2023-04-04 DIAGNOSIS — N184 Chronic kidney disease, stage 4 (severe): Secondary | ICD-10-CM | POA: Diagnosis not present

## 2023-04-04 DIAGNOSIS — E1122 Type 2 diabetes mellitus with diabetic chronic kidney disease: Secondary | ICD-10-CM | POA: Insufficient documentation

## 2023-04-04 HISTORY — DX: Presence of dental prosthetic device (complete) (partial): Z97.2

## 2023-04-04 HISTORY — PX: CATARACT EXTRACTION W/PHACO: SHX586

## 2023-04-04 LAB — GLUCOSE, CAPILLARY: Glucose-Capillary: 112 mg/dL — ABNORMAL HIGH (ref 70–99)

## 2023-04-04 SURGERY — PHACOEMULSIFICATION, CATARACT, WITH IOL INSERTION
Anesthesia: Monitor Anesthesia Care | Laterality: Right

## 2023-04-04 MED ORDER — SIGHTPATH DOSE#1 BSS IO SOLN
INTRAOCULAR | Status: DC | PRN
  Administered 2023-04-04: 73 mL via OPHTHALMIC

## 2023-04-04 MED ORDER — TETRACAINE HCL 0.5 % OP SOLN
1.0000 [drp] | OPHTHALMIC | Status: DC | PRN
  Administered 2023-04-04 (×3): 1 [drp] via OPHTHALMIC

## 2023-04-04 MED ORDER — MIDAZOLAM HCL 2 MG/2ML IJ SOLN
INTRAMUSCULAR | Status: AC
  Filled 2023-04-04: qty 2

## 2023-04-04 MED ORDER — LIDOCAINE HCL (PF) 2 % IJ SOLN
INTRAOCULAR | Status: DC | PRN
  Administered 2023-04-04: 1 mL via INTRAOCULAR

## 2023-04-04 MED ORDER — FENTANYL CITRATE (PF) 100 MCG/2ML IJ SOLN
INTRAMUSCULAR | Status: DC | PRN
  Administered 2023-04-04: 50 ug via INTRAVENOUS

## 2023-04-04 MED ORDER — SIGHTPATH DOSE#1 NA HYALUR & NA CHOND-NA HYALUR IO KIT
PACK | INTRAOCULAR | Status: DC | PRN
  Administered 2023-04-04: 1 via OPHTHALMIC

## 2023-04-04 MED ORDER — ARMC OPHTHALMIC DILATING DROPS
1.0000 | OPHTHALMIC | Status: DC | PRN
  Administered 2023-04-04 (×3): 1 via OPHTHALMIC

## 2023-04-04 MED ORDER — BRIMONIDINE TARTRATE-TIMOLOL 0.2-0.5 % OP SOLN
OPHTHALMIC | Status: DC | PRN
  Administered 2023-04-04: 1 [drp] via OPHTHALMIC

## 2023-04-04 MED ORDER — ARMC OPHTHALMIC DILATING DROPS
OPHTHALMIC | Status: AC
Start: 2023-04-04 — End: ?
  Filled 2023-04-04: qty 0.5

## 2023-04-04 MED ORDER — SIGHTPATH DOSE#1 BSS IO SOLN
INTRAOCULAR | Status: DC | PRN
  Administered 2023-04-04: 15 mL via INTRAOCULAR

## 2023-04-04 MED ORDER — TETRACAINE HCL 0.5 % OP SOLN
OPHTHALMIC | Status: AC
Start: 2023-04-04 — End: ?
  Filled 2023-04-04: qty 4

## 2023-04-04 MED ORDER — MIDAZOLAM HCL 2 MG/2ML IJ SOLN
INTRAMUSCULAR | Status: DC | PRN
  Administered 2023-04-04 (×2): 1 mg via INTRAVENOUS

## 2023-04-04 MED ORDER — FENTANYL CITRATE (PF) 100 MCG/2ML IJ SOLN
INTRAMUSCULAR | Status: AC
  Filled 2023-04-04: qty 2

## 2023-04-04 MED ORDER — MOXIFLOXACIN HCL 0.5 % OP SOLN
OPHTHALMIC | Status: DC | PRN
  Administered 2023-04-04: .2 mL via OPHTHALMIC

## 2023-04-04 SURGICAL SUPPLY — 9 items
CATARACT SUITE SIGHTPATH (MISCELLANEOUS) ×1
DISSECTOR HYDRO NUCLEUS 50X22 (MISCELLANEOUS) ×1 IMPLANT
DRSG TEGADERM 2-3/8X2-3/4 SM (GAUZE/BANDAGES/DRESSINGS) ×1 IMPLANT
FEE CATARACT SUITE SIGHTPATH (MISCELLANEOUS) ×1 IMPLANT
GLOVE SURG SYN 7.5 E (GLOVE) ×1
GLOVE SURG SYN 7.5 PF PI (GLOVE) ×1 IMPLANT
GLOVE SURG SYN 8.5 E (GLOVE) ×1
GLOVE SURG SYN 8.5 PF PI (GLOVE) ×1 IMPLANT
LENS IOL TECNIS EYHANCE 24.5 (Intraocular Lens) IMPLANT

## 2023-04-04 NOTE — H&P (Signed)
[facility]   Primary Care Physician:  Larae Grooms, NP Ophthalmologist: Dr. Deberah Pelton  Pre-Procedure History & Physical: HPI:  Angela Russell is a 72 y.o. female here for cataract surgery.   Past Medical History:  Diagnosis Date   Bladder carcinoma (HCC)    Carotid artery plaque, right 01/2014   CKD (chronic kidney disease)    stage 4-followed by nephrology   Controlled diabetes mellitus type 2 with complications (HCC)    Diabetes mellitus without complication (HCC)    DM (diabetes mellitus), type 2, uncontrolled    Ductal carcinoma in situ (DCIS) of breast    DVT (deep venous thrombosis) (HCC)    Hyperlipidemia    Hypertension    Hypochromic microcytic anemia    mild   Metastatic urothelial carcinoma (HCC) 03/23/2019   Osteoporosis    Parastomal hernia    Personal history of colonic polyps    Port-A-Cath in place    right side   Renal insufficiency    Rotator cuff tendonitis, right    Sepsis (HCC)    UTI (urinary tract infection) 07/03/2022   Wears dentures    full upper and lower    Past Surgical History:  Procedure Laterality Date   BREAST BIOPSY Left 06/13/2022   stereo biopsy/x clip/ path pending   BREAST BIOPSY Left 06/13/2022   stereo biopsy/ ribbon clip/ path pending   BREAST BIOPSY Left 06/13/2022   MM LT BREAST BX W LOC DEV EA AD LESION IMG BX SPEC STEREO GUIDE 06/13/2022 [facility]   BREAST BIOPSY Left 06/13/2022   MM LT BREAST BX W LOC DEV 1ST LESION IMAGE BX SPEC STEREO GUIDE 06/13/2022 [facility]   BREAST LUMPECTOMY WITH RADIOFREQUENCY TAG IDENTIFICATION Left 07/17/2022   Procedure: BREAST LUMPECTOMY WITH RADIOFREQUENCY TAG IDENTIFICATION;  Surgeon: Henrene Dodge, MD;  Location: [facility] ORS;  Service: General;  Laterality: Left;   COLONOSCOPY WITH PROPOFOL N/A 03/17/2021   Procedure: COLONOSCOPY WITH PROPOFOL;  Surgeon: Wyline Mood, MD;  Location[facility] ENDOSCOPY;  Service: Gastroenterology;  Laterality: N/A;   CYSTOSCOPY W/  RETROGRADES Bilateral 07/16/2018   Procedure: CYSTOSCOPY WITH RETROGRADE PYELOGRAM;  Surgeon: Sondra Come, MD;  Location: [facility];  Service: Urology;  Laterality: Bilateral;   ESOPHAGOGASTRODUODENOSCOPY  03/17/2021   Procedure: ESOPHAGOGASTRODUODENOSCOPY (EGD);  Surgeon: Wyline Mood, MD;  Location: [facility] ENDOSCOPY;  Service: Gastroenterology;;   GIVENS CAPSULE STUDY N/A 06/19/2021   Procedure: GIVENS CAPSULE STUDY;  Surgeon: Wyline Mood, MD;  Location[facility]COPY;  Service: Gastroenterology;  Laterality: N/A;   IVC FILTER INSERTION N/A 03/30/2019   Procedure: IVC FILTER INSERTION;  Surgeon: Annice Needy, MD;  Location: [facility] CV LAB;  Service: Cardiovascular;  Laterality: N/A;   IVC FILTER REMOVAL N/A 06/15/2019   Procedure: IVC FILTER REMOVAL;  Surgeon: Annice Needy, MD;  Location: [facility]VASIVE CV LAB;  Service: Cardiovascular;  Laterality: N/A;   KNEE SURGERY Left 03/17/2013   torn meniscus   PERIPHERAL VASCULAR THROMBECTOMY Right 03/30/2019   Procedure: PERIPHERAL VASCULAR THROMBECTOMY;  Surgeon: Annice Needy, MD;  Location: [facility] LAB;  Service: Cardiovascular;  Laterality: Right;   PORTA CATH INSERTION N/A 08/06/2018   Procedure: PORTA CATH INSERTION;  Surgeon: Annice Needy, MD;  Location: [facility]VASIVE CV LAB;  Service: Cardiovascular;  Laterality: N/A;   TOTAL ABDOMINAL HYSTERECTOMY  2000   due to bleeding and fibroids, partial- still has ovaries   TRANSURETHRAL RESECTION OF BLADDER TUMOR N/A 07/16/2018   Procedure: TRANSURETHRAL RESECTION OF BLADDER TUMOR (TURBT);  Surgeon: Sondra Come, MD;  Location: [facility];  Service: Urology;  Laterality: N/A;    Prior to Admission medications   Medication Sig Start Date End Date Taking? Authorizing Provider  amLODipine (NORVASC) 10 MG tablet Take 1 tablet by mouth once daily 03/11/23  Yes Larae Grooms, NP  ferrous sulfate 324 MG TBEC Take 324 mg by mouth daily.   Yes [provider]  losartan  (COZAAR) 25 MG tablet Take 1 tablet by mouth once daily 03/11/23  Yes Larae Grooms, NP  Omega-3 Fatty Acids (FISH OIL PO) Take 1 tablet by mouth daily at 6 (six) AM.   Yes [provider]  Semaglutide (RYBELSUS) 7 MG TABS Take 1 tablet (7 mg total) by mouth daily. 08/29/22  Yes Larae Grooms, NP  acetaminophen (TYLENOL) 500 MG tablet Take 2 tablets (1,000 mg total) by mouth every 6 (six) hours as needed for mild pain. 07/17/22   Henrene Dodge, MD  bisacodyl (DULCOLAX) 5 MG EC tablet Take 1 tablet (5 mg total) by mouth daily as needed for moderate constipation. Patient not taking: Reported on 03/28/2023 10/05/21   Esaw Grandchild A, DO  docusate sodium (COLACE) 100 MG capsule Take 1 capsule (100 mg total) by mouth daily. Patient not taking: Reported on 03/28/2023 03/22/20   Rickard Patience, MD  polyethylene glycol (MIRALAX / GLYCOLAX) 17 g packet Take 17 g by mouth 2 (two) times daily. Patient not taking: Reported on 03/28/2023 10/05/21   Esaw Grandchild A, DO  prochlorperazine (COMPAZINE) 10 MG tablet Take 1 tablet (10 mg total) by mouth every 6 (six) hours as needed (Nausea or vomiting). 08/03/18 08/26/18  Rickard Patience, MD    Allergies as of 03/21/2023   (No Known Allergies)    Family History  Problem Relation Age of Onset   Heart disease Mother    Heart attack Mother    Arthritis Father    Diabetes Brother    Breast cancer Neg Hx     Social History   Socioeconomic History   Marital status: Single    Spouse name: Not on file   Number of children: 1   Years of education: Not on file   Highest education level: 10th grade  Occupational History   Not on file  Tobacco Use   Smoking status: Former    Current packs/day: 0.00    Average packs/day: 1 pack/day for 10.0 years (10.0 ttl pk-yrs)    Types: Cigarettes    Start date: 06/04/1981    Quit date: 06/05/1991    Years since quitting: 31.8   Smokeless tobacco: Never  Vaping Use   Vaping status: Never Used  Substance and Sexual  Activity   Alcohol use: No   Drug use: No   Sexual activity: Not Currently  Other Topics Concern   Not on file  Social History Narrative   Working full time   Social Determinants of Health   Financial Resource Strain: Low Risk  (10/02/2020)   Overall Financial Resource Strain (CARDIA)    Difficulty of Paying Living Expenses: Not hard at all  Food Insecurity: No Food Insecurity (09/06/2017)   Hunger Vital Sign    Worried About Running Out of Food in the Last Year: Never true    Ran Out of Food in the Last Year: Never true  Transportation Needs: No Transportation Needs (09/06/2017)   PRAPARE - Administrator, Civil Service (Medical): No    Lack of Transportation (Non-Medical): No  Physical Activity: Insufficiently Active (  09/06/2017)   Exercise Vital Sign    Days of Exercise per Week: 2 days    Minutes of Exercise per Session: 30 min  Stress: No Stress Concern Present (09/06/2017)   [employer] of Occupational Health - Occupational Stress Questionnaire    Feeling of Stress : Not at all  Social Connections: Moderately Integrated (09/06/2017)   Social Connection and Isolation Panel [NHANES]    Frequency of Communication with Friends and Family: More than three times a week    Frequency of Social Gatherings with Friends and Family: More than three times a week    Attends Religious Services: More than 4 times per year    Active Member of [employer] or Organizations: Yes    Attends Banker Meetings: More than 4 times per year    Marital Status: Separated  Intimate Partner Violence: Not At Risk (09/06/2017)   Humiliation, Afraid, Rape, and Kick questionnaire    Fear of Current or Ex-Partner: No    Emotionally Abused: No    Physically Abused: No    Sexually Abused: No    Review of Systems: See HPI, otherwise negative ROS  Physical Exam: Ht 5\' 3"  (1.6 m)   Wt 95.3 kg   BMI 37.20 kg/m  General:   Alert, cooperative in NAD Head:  Normocephalic and  atraumatic. Respiratory:  Normal work of breathing. Cardiovascular:  RRR  Impression/Plan: Francia Greaves is here for cataract surgery.  Risks, benefits, limitations, and alternatives regarding cataract surgery have been reviewed with the patient.  Questions have been answered.  All parties agreeable.   Estanislado Pandy, MD  04/04/2023, 7:13 AM

## 2023-04-04 NOTE — Op Note (Signed)
OPERATIVE NOTE  Angela Russell [id number] 04/04/2023   PREOPERATIVE DIAGNOSIS: Nuclear sclerotic cataract right eye. H25.11   POSTOPERATIVE DIAGNOSIS: Nuclear sclerotic cataract right eye. H25.11   PROCEDURE:  Phacoemusification with posterior chamber intraocular lens placement of the right eye  Ultrasound time: Procedure(s): CATARACT EXTRACTION PHACO AND INTRAOCULAR LENS PLACEMENT (IOC) RIGHT DIABETIC 9.24 00:59.4 (Right)  LENS:   Implant Name Type Inv. Item Serial No. Manufacturer Lot No. LRB No. Used Action  LENS IOL TECNIS EYHANCE 24.5 - [id number] Intraocular Lens LENS IOL TECNIS EYHANCE 24.5 [id number] [employer]  Right 1 Implanted      SURGEON:  Julious Payer. Rolley Sims, MD   ANESTHESIA:  Topical with tetracaine drops, augmented with 1% preservative-free intracameral lidocaine.   COMPLICATIONS:  None.   DESCRIPTION OF PROCEDURE:  The patient was identified in the holding room and transported to the operating room and placed in the supine position under the operating microscope.  The right eye was identified as the operative eye, which was prepped and draped in the usual sterile ophthalmic fashion.   A 1 millimeter clear-corneal paracentesis was made superotemporally. Preservative-free 1% lidocaine mixed with 1:1,000 bisulfite-free aqueous solution of epinephrine was injected into the anterior chamber. The anterior chamber was then filled with Viscoat viscoelastic. A 2.4 millimeter keratome was used to make a clear-corneal incision inferotemporally. A curvilinear capsulorrhexis was made with a cystotome and capsulorrhexis forceps. Balanced salt solution was used to hydrodissect and hydrodelineate the nucleus. Phacoemulsification was then used to remove the lens nucleus and epinucleus. The remaining cortex was then removed using the irrigation and aspiration handpiece. Provisc was then placed into the capsular bag to distend it for lens placement. A +24.50 D DIB00 intraocular lens was then  injected into the capsular bag. The remaining viscoelastic was aspirated.   Wounds were hydrated with balanced salt solution.  The anterior chamber was inflated to a physiologic pressure with balanced salt solution.  No wound leaks were noted. Moxifloxacin was injected intracamerally  Timolol and Brimonidine drops were applied to the eye.  The patient was taken to the recovery room in stable condition without complications of anesthesia or surgery.  Rolly Pancake [place] 04/04/2023, 10:34 AM

## 2023-04-04 NOTE — Transfer of Care (Signed)
Immediate Anesthesia Transfer of Care Note  Patient: Angela Russell  Procedure(s) Performed: CATARACT EXTRACTION PHACO AND INTRAOCULAR LENS PLACEMENT (IOC) RIGHT DIABETIC 9.24 00:59.4 (Right)  Patient Location: PACU  Anesthesia Type: MAC  Level of Consciousness: awake, alert  and patient cooperative  Airway and Oxygen Therapy: Patient Spontanous Breathing and Patient connected to supplemental oxygen  Post-op Assessment: Post-op Vital signs reviewed, Patient's Cardiovascular Status Stable, Respiratory Function Stable, Patent Airway and No signs of Nausea or vomiting  Post-op Vital Signs: Reviewed and stable  Complications: No notable events documented.

## 2023-04-04 NOTE — Anesthesia Preprocedure Evaluation (Signed)
Anesthesia Evaluation  Patient identified by MRN, date of birth, ID band Patient awake    Reviewed: Allergy & Precautions, NPO status , Patient's Chart, lab work & pertinent test results  Airway Mallampati: III  TM Distance: >3 FB Neck ROM: full    Dental  (+) Chipped   Pulmonary neg pulmonary ROS, former smoker   Pulmonary exam normal        Cardiovascular hypertension, On Medications negative cardio ROS Normal cardiovascular exam     Neuro/Psych negative neurological ROS  negative psych ROS   GI/Hepatic negative GI ROS, Neg liver ROS,,,  Endo/Other  negative endocrine ROSdiabetes    Renal/GU      Musculoskeletal   Abdominal   Peds  Hematology negative hematology ROS (+)   Anesthesia Other Findings Past Medical History: No date: Bladder carcinoma (HCC) 01/2014: Carotid artery plaque, right No date: CKD (chronic kidney disease)     Comment:  stage 4-followed by nephrology No date: Controlled diabetes mellitus type 2 with complications (HCC) No date: Diabetes mellitus without complication (HCC) No date: DM (diabetes mellitus), type 2, uncontrolled No date: Ductal carcinoma in situ (DCIS) of breast No date: DVT (deep venous thrombosis) (HCC) No date: Hyperlipidemia No date: Hypertension No date: Hypochromic microcytic anemia     Comment:  mild 03/23/2019: Metastatic urothelial carcinoma (HCC) No date: Osteoporosis No date: Parastomal hernia No date: Personal history of colonic polyps No date: Port-A-Cath in place     Comment:  right side No date: Renal insufficiency No date: Rotator cuff tendonitis, right No date: Sepsis (HCC) 07/03/2022: UTI (urinary tract infection) No date: Wears dentures     Comment:  full upper and lower  Past Surgical History: 06/13/2022: BREAST BIOPSY; Left     Comment:  stereo biopsy/x clip/ path pending 06/13/2022: BREAST BIOPSY; Left     Comment:  stereo biopsy/ ribbon  clip/ path pending 06/13/2022: BREAST BIOPSY; Left     Comment:  MM LT BREAST BX W LOC DEV EA AD LESION IMG BX SPEC               STEREO GUIDE 06/13/2022 [facility] 06/13/2022: BREAST BIOPSY; Left     Comment:  MM LT BREAST BX W LOC DEV 1ST LESION IMAGE BX SPEC               STEREO GUIDE 06/13/2022 [facility] 07/17/2022: BREAST LUMPECTOMY WITH RADIOFREQUENCY TAG IDENTIFICATION;  Left     Comment:  Procedure: BREAST LUMPECTOMY WITH RADIOFREQUENCY TAG               IDENTIFICATION;  Surgeon: Henrene Dodge, MD;  Location:               [facility];  Service: General;  Laterality: Left; 03/17/2021: COLONOSCOPY WITH PROPOFOL; N/A     Comment:  Procedure: COLONOSCOPY WITH PROPOFOL;  Surgeon: Wyline Mood, MD;  [facility] END[facility];  Service:               Gastroenterology;  Laterality: N/A; 07/16/2018: CYSTOSCOPY W/ RETROGRADES; Bilateral     Comment:  Procedure: CYSTOSCOPY WITH RETROGRADE PYELOGRAM;                Surgeon: Sondra Come, MD;  Location: [facility];                Service: Urology;  Laterality: Bilateral; 03/17/2021: ESOPHAGOGASTRODUODENOSCOPY     Comment:  Procedure: ESOPHAGOGASTRODUODENOSCOPY (EGD);  Surgeon:               Wyline Mood, MD;  Location[facility];  Service:               Gastroenterology;; 06/19/2021: [employer] CAPSULE STUDY; N/A     Comment:  Procedure: GIVENS CAPSULE STUDY;  Surgeon: Wyline Mood,               MD;  Location: [facility] ENDOSCOPY;  Service:               Gastroenterology;  Laterality: N/A; 03/30/2019: IVC FILTER INSERTION; N/A     Comment:  Procedure: IVC FILTER INSERTION;  Surgeon: Annice Needy,              MD;  Location: [facility] CV LAB;  Service:               Cardiovascular;  Laterality: N/A; 06/15/2019: IVC FILTER REMOVAL; N/A     Comment:  Procedure: IVC FILTER REMOVAL;  Surgeon: Annice Needy,               MD;  Location: [facility] LAB;  Service:               Cardiovascular;  Laterality:  N/A; 03/17/2013: KNEE SURGERY; Left     Comment:  torn meniscus 03/30/2019: PERIPHERAL VASCULAR THROMBECTOMY; Right     Comment:  Procedure: PERIPHERAL VASCULAR THROMBECTOMY;  Surgeon:               Annice Needy, MD;  Location: [facility];                Service: Cardiovascular;  Laterality: Right; 08/06/2018: PORTA CATH INSERTION; N/A     Comment:  Procedure: PORTA CATH INSERTION;  Surgeon: Annice Needy,              MD;  Location: [facility] LAB;  Service:               Cardiovascular;  Laterality: N/A; 2000: TOTAL ABDOMINAL HYSTERECTOMY     Comment:  due to bleeding and fibroids, partial- still has ovaries 07/16/2018: TRANSURETHRAL RESECTION OF BLADDER TUMOR; N/A     Comment:  Procedure: TRANSURETHRAL RESECTION OF BLADDER TUMOR               (TURBT);  Surgeon: Sondra Come, MD;  Location: [facility]               ORS;  Service: Urology;  Laterality: N/A;  BMI    Body Mass Index: 38.55 kg/m      Reproductive/Obstetrics negative OB ROS                             Anesthesia Physical Anesthesia Plan  ASA: 2  Anesthesia Plan: MAC   Post-op Pain Management:    Induction: Intravenous  PONV Risk Score and Plan: 3 and Ondansetron, Dexamethasone and Midazolam  Airway Management Planned: Natural Airway and Nasal Cannula  Additional Equipment:   Intra-op Plan:   Post-operative Plan: Extubation in OR  Informed Consent: I have reviewed the patients History and Physical, chart, labs and discussed the procedure including the risks, benefits and alternatives for the proposed anesthesia with the patient or authorized representative who has indicated his/her understanding and acceptance.     Dental Advisory Given  Plan Discussed with: Anesthesiologist, CRNA and Surgeon  Anesthesia Plan Comments: (Patient consented for  risks of anesthesia including but not limited to:  - adverse reactions to medications - damage to eyes, teeth, lips or  other oral mucosa - nerve damage due to positioning  - sore throat or hoarseness - Damage to heart, brain, nerves, lungs, other parts of body or loss of life  Patient voiced understanding and assent.)       Anesthesia Quick Evaluation

## 2023-04-04 NOTE — Anesthesia Postprocedure Evaluation (Signed)
Anesthesia Post Note  Patient: LETISHIA GALINATO  Procedure(s) Performed: CATARACT EXTRACTION PHACO AND INTRAOCULAR LENS PLACEMENT (IOC) RIGHT DIABETIC 9.24 00:59.4 (Right)  Patient location during evaluation: PACU Anesthesia Type: MAC Level of consciousness: awake and alert Pain management: pain level controlled Vital Signs Assessment: post-procedure vital signs reviewed and stable Respiratory status: spontaneous breathing, nonlabored ventilation, respiratory function stable and patient connected to nasal cannula oxygen Cardiovascular status: blood pressure returned to baseline and stable Postop Assessment: no apparent nausea or vomiting Anesthetic complications: no  No notable events documented.   Last Vitals:  Vitals:   04/04/23 1035 04/04/23 1040  BP: 129/73 (!) 136/56  Pulse: 85 83  Resp: 15 16  Temp: (!) 36.2 C (!) 36.2 C  SpO2: 100% 100%    Last Pain:  Vitals:   04/04/23 1040  TempSrc:   PainSc: 0-No pain                 Stephanie Coup

## 2023-04-05 ENCOUNTER — Encounter: Payer: Self-pay | Admitting: Ophthalmology

## 2023-04-05 DIAGNOSIS — H2512 Age-related nuclear cataract, left eye: Secondary | ICD-10-CM | POA: Diagnosis not present

## 2023-04-09 ENCOUNTER — Ambulatory Visit: Payer: Medicare HMO | Admitting: Emergency Medicine

## 2023-04-09 VITALS — Ht 63.5 in | Wt 210.0 lb

## 2023-04-09 DIAGNOSIS — Z0000 Encounter for general adult medical examination without abnormal findings: Secondary | ICD-10-CM

## 2023-04-09 NOTE — Progress Notes (Signed)
Subjective:   Angela Russell is a 72 y.o. female who presents for Medicare Annual (Subsequent) preventive examination.  Visit Complete: Virtual I connected with  Francia Greaves on 04/09/23 by a audio enabled telemedicine application and verified that I am speaking with the correct person using two identifiers.  Patient Location: Home  Provider Location: Office/Clinic  I discussed the limitations of evaluation and management by telemedicine. The patient expressed understanding and agreed to proceed.  Vital Signs: Because this visit was a virtual/telehealth visit, some criteria may be missing or patient reported. Any vitals not documented were not able to be obtained and vitals that have been documented are patient reported.   Cardiac Risk Factors include: advanced age (>20men, >71 women);diabetes mellitus;dyslipidemia;hypertension;obesity (BMI >30kg/m2)     Objective:    Today's Vitals   04/09/23 1549  Weight: 210 lb (95.3 kg)  Height: 5' 3.5" (1.613 m)   Body mass index is 36.62 kg/m.     04/09/2023    3:58 PM 04/04/2023    8:26 AM 11/05/2022    9:44 AM 11/05/2022    9:42 AM 08/14/2022    9:15 AM 07/17/2022    6:22 AM 10/31/2021    9:45 AM  Advanced Directives  Does Patient Have a Medical Advance Directive? Yes Yes Yes Yes Yes Yes Yes  Type of Estate agent of [place];Living will Healthcare Power of [place];Living will  Healthcare Power of [place];Living will Healthcare Power of [place];Living will Healthcare Power of [place];Living will Healthcare Power of [place];Living will  Does patient want to make changes to medical advance directive? No - Patient declined No - Patient declined  No - Patient declined No - Patient declined No - Patient declined   Copy of Healthcare Power of Attorney in Chart? No - copy requested Yes - validated most recent copy scanned in chart (See row information)  No - copy requested No - copy requested No - copy requested    Would patient like information on creating a medical advance directive?    No - Patient declined No - Patient declined      Current Medications (verified) Outpatient Encounter Medications as of 04/09/2023  Medication Sig   acetaminophen (TYLENOL) 500 MG tablet Take 2 tablets (1,000 mg total) by mouth every 6 (six) hours as needed for mild pain.   amLODipine (NORVASC) 10 MG tablet Take 1 tablet by mouth once daily   bisacodyl (DULCOLAX) 5 MG EC tablet Take 1 tablet (5 mg total) by mouth daily as needed for moderate constipation.   ferrous sulfate 324 MG TBEC Take 324 mg by mouth daily.   losartan (COZAAR) 25 MG tablet Take 1 tablet by mouth once daily   Omega-3 Fatty Acids (FISH OIL PO) Take 1 tablet by mouth daily at 6 (six) AM.   polyethylene glycol (MIRALAX / GLYCOLAX) 17 g packet Take 17 g by mouth 2 (two) times daily.   Semaglutide (RYBELSUS) 7 MG TABS Take 1 tablet (7 mg total) by mouth daily.   docusate sodium (COLACE) 100 MG capsule Take 1 capsule (100 mg total) by mouth daily. (Patient not taking: Reported on 03/28/2023)   [DISCONTINUED] prochlorperazine (COMPAZINE) 10 MG tablet Take 1 tablet (10 mg total) by mouth every 6 (six) hours as needed (Nausea or vomiting).   Facility-Administered Encounter Medications as of 04/09/2023  Medication   heparin lock flush 100 UNIT/ML injection    Allergies (verified) Patient has no known allergies.   History: Past Medical History:  Diagnosis  Date   Bladder carcinoma [facility])    Carotid artery plaque, right 01/2014   CKD (chronic kidney disease)    stage 4-followed by nephrology   Controlled diabetes mellitus type 2 with complications (HCC)    Diabetes mellitus without complication (HCC)    DM (diabetes mellitus), type 2, uncontrolled    Ductal carcinoma in situ (DCIS) of breast    DVT (deep venous thrombosis) (HCC)    Hyperlipidemia    Hypertension    Hypochromic microcytic anemia    mild   Metastatic urothelial carcinoma (HCC)  03/23/2019   Osteoporosis    Parastomal hernia    Personal history of colonic polyps    Port-A-Cath in place    right side   Renal insufficiency    Rotator cuff tendonitis, right    Sepsis (HCC)    UTI (urinary tract infection) 07/03/2022   Wears dentures    full upper and lower   Past Surgical History:  Procedure Laterality Date   BREAST BIOPSY Left 06/13/2022   stereo biopsy/x clip/ path pending   BREAST BIOPSY Left 06/13/2022   stereo biopsy/ ribbon clip/ path pending   BREAST BIOPSY Left 06/13/2022   MM LT BREAST BX W LOC DEV EA AD LESION IMG BX SPEC STEREO GUIDE 06/13/2022 [facility]   BREAST BIOPSY Left 06/13/2022   MM LT BREAST BX W LOC DEV 1ST LESION IMAGE BX SPEC STEREO GUIDE 06/13/2022 [facility]   BREAST LUMPECTOMY WITH RADIOFREQUENCY TAG IDENTIFICATION Left 07/17/2022   Procedure: BREAST LUMPECTOMY WITH RADIOFREQUENCY TAG IDENTIFICATION;  Surgeon: Henrene Dodge, MD;  Location: [facility];  Service: General;  Laterality: Left;   CATARACT EXTRACTION W/PHACO Right 04/04/2023   Procedure: CATARACT EXTRACTION PHACO AND INTRAOCULAR LENS PLACEMENT (IOC) RIGHT DIABETIC 9.24 00:59.4;  Surgeon: Estanislado Pandy, MD;  Location: [facility];  Service: Ophthalmology;  Laterality: Right;   COLONOSCOPY WITH PROPOFOL N/A 03/17/2021   Procedure: COLONOSCOPY WITH PROPOFOL;  Surgeon: Wyline Mood, MD;  Location: [facility];  Service: Gastroenterology;  Laterality: N/A;   CYSTOSCOPY W/ RETROGRADES Bilateral 07/16/2018   Procedure: CYSTOSCOPY WITH RETROGRADE PYELOGRAM;  Surgeon: Sondra Come, MD;  Location: [facility];  Service: Urology;  Laterality: Bilateral;   ESOPHAGOGASTRODUODENOSCOPY  03/17/2021   Procedure: ESOPHAGOGASTRODUODENOSCOPY (EGD);  Surgeon: Wyline Mood, MD;  Location: [facility] ENDOSCOPY;  Service: Gastroenterology;;   GIVENS CAPSULE STUDY N/A 06/19/2021   Procedure: GIVENS CAPSULE STUDY;  Surgeon: Wyline Mood, MD;  Location: [facility] END[facility]COPY;  Service:  Gastroenterology;  Laterality: N/A;   IVC FILTER INSERTION N/A 03/30/2019   Procedure: IVC FILTER INSERTION;  Surgeon: Annice Needy, MD;  Location: [facility];  Service: Cardiovascular;  Laterality: N/A;   IVC FILTER REMOVAL N/A 06/15/2019   Procedure: IVC FILTER REMOVAL;  Surgeon: Annice Needy, MD;  Location: [facility];  Service: Cardiovascular;  Laterality: N/A;   KNEE SURGERY Left 03/17/2013   torn meniscus   PERIPHERAL VASCULAR THROMBECTOMY Right 03/30/2019   Procedure: PERIPHERAL VASCULAR THROMBECTOMY;  Surgeon: Annice Needy, MD;  Location: [facility];  Service: Cardiovascular;  Laterality: Right;   PORTA CATH INSERTION N/A 08/06/2018   Procedure: PORTA CATH INSERTION;  Surgeon: Annice Needy, MD;  Location: [facility];  Service: Cardiovascular;  Laterality: N/A;   TOTAL ABDOMINAL HYSTERECTOMY  2000   due to bleeding and fibroids, partial- still has ovaries   TRANSURETHRAL RESECTION OF BLADDER TUMOR N/A 07/16/2018   Procedure: TRANSURETHRAL RESECTION OF BLADDER TUMOR (TURBT);  Surgeon: Richardo Hanks,  Laurette Schimke, MD;  Location: [facility];  Service: Urology;  Laterality: N/A;   Family History  Problem Relation Age of Onset   Heart disease Mother    Heart attack Mother    Arthritis Father    Diabetes Brother    Breast cancer Neg Hx    Social History   Socioeconomic History   Marital status: Divorced    Spouse name: Not on file   Number of children: 1   Years of education: Not on file   Highest education level: 10th grade  Occupational History   Not on file  Tobacco Use   Smoking status: Former    Current packs/day: 0.00    Average packs/day: 1 pack/day for 10.0 years (10.0 ttl pk-yrs)    Types: Cigarettes    Start date: 06/04/1981    Quit date: 06/05/1991    Years since quitting: 31.8   Smokeless tobacco: Never  Vaping Use   Vaping status: Never Used  Substance and Sexual Activity   Alcohol use: No   Drug use: No   Sexual activity: Not  Currently  Other Topics Concern   Not on file  Social History Narrative   Working full time   Social Determinants of Health   Financial Resource Strain: Low Risk  (04/09/2023)   Overall Financial Resource Strain (CARDIA)    Difficulty of Paying Living Expenses: Not hard at all  Food Insecurity: No Food Insecurity (04/09/2023)   Hunger Vital Sign    Worried About Running Out of Food in the Last Year: Never true    Ran Out of Food in the Last Year: Never true  Transportation Needs: No Transportation Needs (04/09/2023)   PRAPARE - Administrator, Civil Service (Medical): No    Lack of Transportation (Non-Medical): No  Physical Activity: Insufficiently Active (04/09/2023)   Exercise Vital Sign    Days of Exercise per Week: 3 days    Minutes of Exercise per Session: 30 min  Stress: No Stress Concern Present (04/09/2023)   [employer] of Occupational Health - Occupational Stress Questionnaire    Feeling of Stress : Not at all  Social Connections: Moderately Isolated (04/09/2023)   Social Connection and Isolation Panel [NHANES]    Frequency of Communication with Friends and Family: More than three times a week    Frequency of Social Gatherings with Friends and Family: More than three times a week    Attends Religious Services: More than 4 times per year    Active Member of [employer] or Organizations: No    Attends Engineer, structural: Never    Marital Status: Divorced    Tobacco Counseling Counseling given: Not Answered   Clinical Intake:  Pre-visit preparation completed: Yes  Pain : No/denies pain     BMI - recorded: 36.62 Nutritional Status: BMI > 30  Obese Nutritional Risks: None Diabetes: Yes CBG done?: No Did pt. bring in CBG monitor from home?: No  How often do you need to have someone help you when you read instructions, pamphlets, or other written materials from your doctor or pharmacy?: 1 - Never  Interpreter Needed?: No  Information  entered by :: Tora Kindred, CMA   Activities of Daily Living    04/09/2023    3:51 PM 04/04/2023    8:26 AM  In your present state of health, do you have any difficulty performing the following activities:  Hearing? 0 0  Vision? 0 0  Difficulty concentrating or making decisions? 0  0  Walking or climbing stairs? 0   Dressing or bathing? 0   Doing errands, shopping? 0   Preparing Food and eating ? N   Using the Toilet? N   In the past six months, have you accidently leaked urine? N   Do you have problems with loss of bowel control? N   Managing your Medications? N   Managing your Finances? N   Housekeeping or managing your Housekeeping? N     Patient Care Team: Larae Grooms, NP as PCP - [place], [place], [place] (Optometry) Rickard Patience, MD as Consulting Physician (Hematology and Oncology) Lajean Manes, [facility] (Inactive) (Pharmacist) Hulen Luster, RN as Oncology Nurse Navigator  Indicate any recent Medical Services you may have received from other than [facility] providers in the past year (date may be approximate).     Assessment:   This is a routine wellness examination for [facility].  Hearing/Vision screen Hearing Screening - Comments:: Denies hearing loss Vision Screening - Comments:: Gets eye exams   Goals Addressed               This Visit's Progress     Patient Stated (pt-stated)        Exercise more and lose weight      Depression Screen    04/09/2023    3:56 PM 08/24/2022    8:16 AM 07/03/2022    3:57 PM 02/22/2022    8:51 AM 07/14/2021    8:10 AM 09/15/2020    3:39 PM 06/22/2019    3:08 PM  PHQ 2/9 Scores  PHQ - 2 Score 0 0 0 0 0 0 0  PHQ- 9 Score 0 1 0 0 0      Fall Risk    04/09/2023    3:59 PM 08/24/2022    8:16 AM 07/03/2022    3:57 PM 06/29/2022   10:15 AM 06/29/2022   10:13 AM  Fall Risk   Falls in the past year? 0 0 0 0 0  Number falls in past yr: 0 0 0    Injury with Fall? 0 0 0    Risk for fall due to : No Fall Risks No Fall Risks No Fall  Risks    Follow up Falls prevention discussed Falls evaluation completed Falls evaluation completed      MEDICARE RISK AT HOME: Medicare Risk at Home Any stairs in or around the home?: Yes If so, are there any without handrails?: No Home free of loose throw rugs in walkways, pet beds, electrical cords, etc?: Yes Adequate lighting in your home to reduce risk of falls?: Yes Life alert?: No Use of a cane, walker or w/c?: No Grab bars in the bathroom?: No Shower chair or bench in shower?: Yes Elevated toilet seat or a handicapped toilet?: Yes  TIMED UP AND GO:  Was the test performed?  No    Cognitive Function:        04/09/2023    4:00 PM 06/22/2019    3:15 PM 09/06/2017    3:09 PM  6CIT Screen  What Year? 0 points 0 points 0 points  What month? 0 points 0 points 0 points  What time? 0 points 0 points 0 points  Count back from 20 0 points 0 points 0 points  Months in reverse 0 points 0 points 0 points  Repeat phrase 0 points 2 points 0 points  Total Score 0 points 2 points 0 points    Immunizations Immunization History  Administered Date(s) Administered   Fluad Quad(high Dose 65+) 06/22/2019, 03/11/2020, 07/14/2021, 02/22/2022   Influenza Split 03/25/2015   Influenza, High Dose Seasonal PF 05/31/2017, 04/11/2018   PFIZER(Purple Top)SARS-COV-2 Vaccination 09/04/2019, 09/29/2019, 05/20/2020   Pneumococcal Conjugate-13 05/04/2017   Pneumococcal Polysaccharide-23 04/09/2013, 07/14/2021    TDAP status: Due, Education has been provided regarding the importance of this vaccine. Advised may receive this vaccine at local pharmacy or Health Dept. Aware to provide a copy of the vaccination record if obtained from local pharmacy or Health Dept. Verbalized acceptance and understanding.  Flu Vaccine status: Due, Education has been provided regarding the importance of this vaccine. Advised may receive this vaccine at local pharmacy or Health Dept. Aware to provide a copy of the  vaccination record if obtained from local pharmacy or Health Dept. Verbalized acceptance and understanding.  Pneumococcal vaccine status: Up to date  Covid-19 vaccine status: Declined, Education has been provided regarding the importance of this vaccine but patient still declined. Advised may receive this vaccine at local pharmacy or Health Dept.or vaccine clinic. Aware to provide a copy of the vaccination record if obtained from local pharmacy or Health Dept. Verbalized acceptance and understanding.  Qualifies for Shingles Vaccine? Yes   Zostavax completed No   Shingrix Completed?: No.    Education has been provided regarding the importance of this vaccine. Patient has been advised to call insurance company to determine out of pocket expense if they have not yet received this vaccine. Advised may also receive vaccine at local pharmacy or Health Dept. Verbalized acceptance and understanding.  Screening Tests Health Maintenance  Topic Date Due   DTaP/Tdap/Td (1 - Tdap) Never done   Zoster Vaccines- Shingrix (1 of 2) Never done   OPHTHALMOLOGY EXAM  06/02/2022   INFLUENZA VACCINE  01/03/2023   COVID-19 Vaccine (4 - 2023-24 season) 02/03/2023   Diabetic kidney evaluation - Urine ACR  02/23/2023   FOOT EXAM  02/23/2023   HEMOGLOBIN A1C  02/24/2023   Diabetic kidney evaluation - eGFR measurement  02/19/2024   Medicare Annual Wellness (AWV)  04/08/2024   MAMMOGRAM  04/19/2024   Colonoscopy  03/18/2031   DEXA SCAN  04/19/2032   Pneumonia Vaccine 49+ Years old  Completed   Hepatitis C Screening  Completed   HPV VACCINES  Aged Out    Health Maintenance  Health Maintenance Due  Topic Date Due   DTaP/Tdap/Td (1 - Tdap) Never done   Zoster Vaccines- Shingrix (1 of 2) Never done   OPHTHALMOLOGY EXAM  06/02/2022   INFLUENZA VACCINE  01/03/2023   COVID-19 Vaccine (4 - 2023-24 season) 02/03/2023   Diabetic kidney evaluation - Urine ACR  02/23/2023   FOOT EXAM  02/23/2023   HEMOGLOBIN A1C   02/24/2023    Colorectal cancer screening: Type of screening: Colonoscopy. Completed 03/17/21. Repeat every 10 years  Mammogram status: Completed 04/19/22. Repeat every year. Scheduled 04/23/23  Bone Density status: Completed 04/19/22. Results reflect: Bone density results: NORMAL. Repeat every 10 years.  Lung Cancer Screening: (Low Dose CT Chest recommended if Age 64-80 years, 20 pack-year currently smoking OR have quit w/in 15years.) does not qualify.   Lung Cancer Screening Referral: n/a  Additional Screening:  Hepatitis C Screening: does not qualify; Completed 06/22/19  Vision Screening: Recommended annual ophthalmology exams for early detection of glaucoma and other disorders of the eye. Is the patient up to date with their annual eye exam?  Yes , requested records from Dr. Larence Penning. Who is the provider or what is  the name of the office in which the patient attends annual eye exams? Dr. Larence Penning If pt is not established with a provider, would they like to be referred to a provider to establish care? No .   Dental Screening: Recommended annual dental exams for proper oral hygiene  Diabetic Foot Exam: Diabetic Foot Exam: Overdue, Pt has been advised about the importance in completing this exam. Pt is scheduled for diabetic foot exam on 04/29/23.  Community Resource Referral / Chronic Care Management: CRR required this visit?  No   CCM required this visit?  No     Plan:     I have personally reviewed and noted the following in the patient's chart:   Medical and social history Use of alcohol, tobacco or illicit drugs  Current medications and supplements including opioid prescriptions. Patient is not currently taking opioid prescriptions. Functional ability and status Nutritional status Physical activity Advanced directives List of other physicians Hospitalizations, surgeries, and ER visits in previous 12 months Vitals Screenings to include cognitive, depression, and  falls Referrals and appointments  In addition, I have reviewed and discussed with patient certain preventive protocols, quality metrics, and best practice recommendations. A written personalized care plan for preventive services as well as general preventive health recommendations were provided to patient.     Tora Kindred, CMA   04/09/2023   After Visit Summary: (Mail) Due to this being a telephonic visit, the after visit summary with patients personalized plan was offered to patient via mail   Nurse Notes:  Declined DM & Nutrition education. Declined covid and shingles vaccines. Needs Tdap Needs DM foot exam at next OV on  04/29/23 Requested last eye exam from Dr. Larence Penning

## 2023-04-09 NOTE — Patient Instructions (Addendum)
Angela Russell , Thank you for taking time to come for your Medicare Wellness Visit. I appreciate your ongoing commitment to your health goals. Please review the following plan we discussed and let me know if I can assist you in the future.   Referrals/Orders/Follow-Ups/Clinician Recommendations: Recommend getting a tetanus shot at your earliest convenience. You are due for a diabetic foot exam. This can be done at your next appointment with Larae Grooms, NP on 04/29/23.  This is a list of the screening recommended for you and due dates:  Health Maintenance  Topic Date Due   DTaP/Tdap/Td vaccine (1 - Tdap) Never done   Zoster (Shingles) Vaccine (1 of 2) Never done   Eye exam for diabetics  06/02/2022   COVID-19 Vaccine (4 - 2023-24 season) 02/03/2023   Yearly kidney health urinalysis for diabetes  02/23/2023   Complete foot exam   02/23/2023   Hemoglobin A1C  02/24/2023   Flu Shot  09/02/2023*   Yearly kidney function blood test for diabetes  02/19/2024   Medicare Annual Wellness Visit  04/08/2024   Mammogram  04/19/2024   Colon Cancer Screening  03/18/2031   DEXA scan (bone density measurement)  04/19/2032   Pneumonia Vaccine  Completed   Hepatitis C Screening  Completed   HPV Vaccine  Aged Out  *Topic was postponed. The date shown is not the original due date.    Advanced directives: (Copy Requested) Please bring a copy of your health care power of attorney and living will to the office to be added to your chart at your convenience.  Next Medicare Annual Wellness Visit scheduled for next year: Yes, 04/14/24 @ 3:50pm

## 2023-04-16 ENCOUNTER — Inpatient Hospital Stay: Payer: Medicare HMO | Attending: Oncology

## 2023-04-16 DIAGNOSIS — D0512 Intraductal carcinoma in situ of left breast: Secondary | ICD-10-CM | POA: Diagnosis not present

## 2023-04-16 DIAGNOSIS — Z95828 Presence of other vascular implants and grafts: Secondary | ICD-10-CM

## 2023-04-16 DIAGNOSIS — Z452 Encounter for adjustment and management of vascular access device: Secondary | ICD-10-CM | POA: Diagnosis not present

## 2023-04-16 MED ORDER — HEPARIN SOD (PORK) LOCK FLUSH 100 UNIT/ML IV SOLN
500.0000 [IU] | Freq: Once | INTRAVENOUS | Status: AC
Start: 2023-04-16 — End: 2023-04-16
  Administered 2023-04-16: 500 [IU] via INTRAVENOUS
  Filled 2023-04-16: qty 5

## 2023-04-16 MED ORDER — SODIUM CHLORIDE 0.9% FLUSH
10.0000 mL | Freq: Once | INTRAVENOUS | Status: AC
  Administered 2023-04-16: 10 mL via INTRAVENOUS
  Filled 2023-04-16: qty 10

## 2023-04-23 ENCOUNTER — Ambulatory Visit
Admission: RE | Admit: 2023-04-23 | Discharge: 2023-04-23 | Disposition: A | Discharge status: Home / Self Care | Payer: Medicare HMO | Source: Ambulatory Visit | Attending: Oncology | Admitting: Oncology

## 2023-04-23 DIAGNOSIS — I700 Atherosclerosis of aorta: Secondary | ICD-10-CM | POA: Diagnosis not present

## 2023-04-23 DIAGNOSIS — Z936 Other artificial openings of urinary tract status: Secondary | ICD-10-CM | POA: Diagnosis not present

## 2023-04-23 DIAGNOSIS — C7910 Secondary malignant neoplasm of unspecified urinary organs: Secondary | ICD-10-CM | POA: Insufficient documentation

## 2023-04-23 DIAGNOSIS — D0512 Intraductal carcinoma in situ of left breast: Secondary | ICD-10-CM | POA: Insufficient documentation

## 2023-04-23 DIAGNOSIS — R92323 Mammographic fibroglandular density, bilateral breasts: Secondary | ICD-10-CM | POA: Diagnosis not present

## 2023-04-23 DIAGNOSIS — C679 Malignant neoplasm of bladder, unspecified: Secondary | ICD-10-CM

## 2023-04-23 DIAGNOSIS — J432 Centrilobular emphysema: Secondary | ICD-10-CM | POA: Diagnosis not present

## 2023-04-23 DIAGNOSIS — C7911 Secondary malignant neoplasm of bladder: Secondary | ICD-10-CM | POA: Diagnosis not present

## 2023-04-23 HISTORY — DX: Personal history of antineoplastic chemotherapy: Z92.21

## 2023-04-23 HISTORY — DX: Personal history of irradiation: Z92.3

## 2023-04-23 NOTE — Discharge Instructions (Signed)

## 2023-04-24 ENCOUNTER — Encounter: Payer: Self-pay | Admitting: Ophthalmology

## 2023-04-24 NOTE — Anesthesia Preprocedure Evaluation (Signed)
Anesthesia Evaluation  Patient identified by MRN, date of birth, ID band Patient awake    Reviewed: Allergy & Precautions, H&P , NPO status , Patient's Chart, lab work & pertinent test results  Airway Mallampati: III  TM Distance: >3 FB Neck ROM: Full    Dental no notable dental hx. (+) Upper Dentures, Lower Dentures   Pulmonary former smoker   Pulmonary exam normal breath sounds clear to auscultation       Cardiovascular hypertension, Normal cardiovascular exam Rhythm:Regular Rate:Normal     Neuro/Psych negative neurological ROS  negative psych ROS   GI/Hepatic negative GI ROS, Neg liver ROS,,,  Endo/Other  diabetes    Renal/GU Renal diseasenegative Renal ROS  negative genitourinary   Musculoskeletal negative musculoskeletal ROS (+)    Abdominal   Peds negative pediatric ROS (+)  Hematology negative hematology ROS (+) Blood dyscrasia, anemia   Anesthesia Other Findings Medical History  Diabetes mellitus without complication (HCC) Hyperlipidemia Hypertension  DM (diabetes mellitus), type 2, uncontrolled Renal insufficiency  Carotid artery plaque, right Rotator cuff tendonitis, right  CKD (chronic kidney disease) Hypochromic microcytic anemia Osteoporosis Metastatic urothelial carcinoma (HCC) Controlled diabetes mellitus type 2 with complications (HCC) Personal history of colonic polyps UTI (urinary tract infection) DVT (deep venous thrombosis) (HCC) Sepsis (HCC) Bladder carcinoma (HCC)  Ductal carcinoma in situ (DCIS) of breast Parastomal hernia  Port-A-Cath in place Wears dentures  Personal history of radiation therapy  Personal history of chemotherapy   Previous cataract surgery 04-04-23 Dr. Lorette Ang anesthesiologist   Reproductive/Obstetrics negative OB ROS                              Anesthesia Physical Anesthesia Plan  ASA: 3  Anesthesia Plan: MAC   Post-op Pain  Management:    Induction: Intravenous  PONV Risk Score and Plan:   Airway Management Planned: Natural Airway and Nasal Cannula  Additional Equipment:   Intra-op Plan:   Post-operative Plan:   Informed Consent: I have reviewed the patients History and Physical, chart, labs and discussed the procedure including the risks, benefits and alternatives for the proposed anesthesia with the patient or authorized representative who has indicated his/her understanding and acceptance.     Dental Advisory Given  Plan Discussed with: Anesthesiologist, CRNA and Surgeon  Anesthesia Plan Comments: (Patient consented for risks of anesthesia including but not limited to:  - adverse reactions to medications - damage to eyes, teeth, lips or other oral mucosa - nerve damage due to positioning  - sore throat or hoarseness - Damage to heart, brain, nerves, lungs, other parts of body or loss of life  Patient voiced understanding and assent.)         Anesthesia Quick Evaluation

## 2023-04-25 ENCOUNTER — Ambulatory Visit
Admission: RE | Admit: 2023-04-25 | Discharge: 2023-04-25 | Disposition: A | Discharge status: Home / Self Care | Payer: Medicare HMO | Attending: Ophthalmology | Admitting: Ophthalmology

## 2023-04-25 ENCOUNTER — Encounter
Admission: RE | Disposition: A | Discharge status: Home / Self Care | Payer: Self-pay | Source: Home / Self Care | Attending: Ophthalmology

## 2023-04-25 ENCOUNTER — Encounter: Payer: Self-pay | Admitting: Ophthalmology

## 2023-04-25 ENCOUNTER — Ambulatory Visit: Payer: Medicare HMO | Admitting: Anesthesiology

## 2023-04-25 ENCOUNTER — Other Ambulatory Visit: Payer: Self-pay

## 2023-04-25 DIAGNOSIS — I129 Hypertensive chronic kidney disease with stage 1 through stage 4 chronic kidney disease, or unspecified chronic kidney disease: Secondary | ICD-10-CM | POA: Insufficient documentation

## 2023-04-25 DIAGNOSIS — H2512 Age-related nuclear cataract, left eye: Secondary | ICD-10-CM | POA: Insufficient documentation

## 2023-04-25 DIAGNOSIS — Z7984 Long term (current) use of oral hypoglycemic drugs: Secondary | ICD-10-CM | POA: Diagnosis not present

## 2023-04-25 DIAGNOSIS — E1122 Type 2 diabetes mellitus with diabetic chronic kidney disease: Secondary | ICD-10-CM | POA: Diagnosis not present

## 2023-04-25 DIAGNOSIS — N184 Chronic kidney disease, stage 4 (severe): Secondary | ICD-10-CM | POA: Insufficient documentation

## 2023-04-25 DIAGNOSIS — Z87891 Personal history of nicotine dependence: Secondary | ICD-10-CM | POA: Insufficient documentation

## 2023-04-25 DIAGNOSIS — E1136 Type 2 diabetes mellitus with diabetic cataract: Secondary | ICD-10-CM | POA: Insufficient documentation

## 2023-04-25 DIAGNOSIS — D631 Anemia in chronic kidney disease: Secondary | ICD-10-CM | POA: Diagnosis not present

## 2023-04-25 HISTORY — PX: CATARACT EXTRACTION W/PHACO: SHX586

## 2023-04-25 HISTORY — DX: Anemia in chronic kidney disease: D63.1

## 2023-04-25 HISTORY — DX: Anemia in chronic kidney disease: N18.4

## 2023-04-25 LAB — GLUCOSE, CAPILLARY: Glucose-Capillary: 133 mg/dL — ABNORMAL HIGH (ref 70–99)

## 2023-04-25 SURGERY — PHACOEMULSIFICATION, CATARACT, WITH IOL INSERTION
Anesthesia: Monitor Anesthesia Care | Site: Eye | Laterality: Left

## 2023-04-25 MED ORDER — BRIMONIDINE TARTRATE-TIMOLOL 0.2-0.5 % OP SOLN
OPHTHALMIC | Status: DC | PRN
  Administered 2023-04-25: 1 [drp] via OPHTHALMIC

## 2023-04-25 MED ORDER — SIGHTPATH DOSE#1 BSS IO SOLN
INTRAOCULAR | Status: DC | PRN
  Administered 2023-04-25: 78 mL via OPHTHALMIC

## 2023-04-25 MED ORDER — MIDAZOLAM HCL 2 MG/2ML IJ SOLN
INTRAMUSCULAR | Status: AC
  Filled 2023-04-25: qty 2

## 2023-04-25 MED ORDER — TETRACAINE HCL 0.5 % OP SOLN
OPHTHALMIC | Status: AC
  Filled 2023-04-25: qty 4

## 2023-04-25 MED ORDER — LIDOCAINE HCL (PF) 2 % IJ SOLN
INTRAOCULAR | Status: DC | PRN
  Administered 2023-04-25: 4 mL via INTRAOCULAR

## 2023-04-25 MED ORDER — FENTANYL CITRATE (PF) 100 MCG/2ML IJ SOLN
INTRAMUSCULAR | Status: DC | PRN
  Administered 2023-04-25: 50 ug via INTRAVENOUS

## 2023-04-25 MED ORDER — TETRACAINE HCL 0.5 % OP SOLN
1.0000 [drp] | OPHTHALMIC | Status: DC | PRN
  Administered 2023-04-25 (×3): 1 [drp] via OPHTHALMIC

## 2023-04-25 MED ORDER — ARMC OPHTHALMIC DILATING DROPS
1.0000 | OPHTHALMIC | Status: DC | PRN
  Administered 2023-04-25 (×3): 1 via OPHTHALMIC

## 2023-04-25 MED ORDER — ARMC OPHTHALMIC DILATING DROPS
OPHTHALMIC | Status: AC
Start: 2023-04-25 — End: ?
  Filled 2023-04-25: qty 0.5

## 2023-04-25 MED ORDER — SIGHTPATH DOSE#1 NA HYALUR & NA CHOND-NA HYALUR IO KIT
PACK | INTRAOCULAR | Status: DC | PRN
  Administered 2023-04-25: 1 via OPHTHALMIC

## 2023-04-25 MED ORDER — FENTANYL CITRATE (PF) 100 MCG/2ML IJ SOLN
INTRAMUSCULAR | Status: AC
  Filled 2023-04-25: qty 2

## 2023-04-25 MED ORDER — MOXIFLOXACIN HCL 0.5 % OP SOLN
OPHTHALMIC | Status: DC | PRN
  Administered 2023-04-25: .2 mL via OPHTHALMIC

## 2023-04-25 MED ORDER — SIGHTPATH DOSE#1 BSS IO SOLN
INTRAOCULAR | Status: DC | PRN
  Administered 2023-04-25: 15 mL via INTRAOCULAR

## 2023-04-25 MED ORDER — MIDAZOLAM HCL 2 MG/2ML IJ SOLN
INTRAMUSCULAR | Status: DC | PRN
  Administered 2023-04-25: 1 mg via INTRAVENOUS

## 2023-04-25 SURGICAL SUPPLY — 20 items
BNDG EYE OVAL 2 1/8 X 2 5/8 (GAUZE/BANDAGES/DRESSINGS) IMPLANT
CANNULA ANT/CHMB 27G (MISCELLANEOUS) IMPLANT
CANNULA ANT/CHMB 27GA (MISCELLANEOUS)
CATARACT SUITE SIGHTPATH (MISCELLANEOUS) ×1
DISSECTOR HYDRO NUCLEUS 50X22 (MISCELLANEOUS) ×1 IMPLANT
DRSG TEGADERM 2-3/8X2-3/4 SM (GAUZE/BANDAGES/DRESSINGS) ×1 IMPLANT
FEE CATARACT SUITE SIGHTPATH (MISCELLANEOUS) ×1 IMPLANT
GLOVE SURG SYN 7.5 E (GLOVE) ×1
GLOVE SURG SYN 7.5 PF PI (GLOVE) ×1 IMPLANT
GLOVE SURG SYN 8.5 E (GLOVE) ×1
GLOVE SURG SYN 8.5 PF PI (GLOVE) ×1 IMPLANT
LENS IOL TECNIS EYHANCE 24.5 (Intraocular Lens) IMPLANT
NDL FILTER BLUNT 18X1 1/2 (NEEDLE) IMPLANT
NEEDLE FILTER BLUNT 18X1 1/2 (NEEDLE)
PACK VIT ANT 23G (MISCELLANEOUS) IMPLANT
RING MALYGIN 7.0 (MISCELLANEOUS) IMPLANT
SUT ETHILON 10-0 CS-B-6CS-B-6 (SUTURE)
SUTURE EHLN 10-0 CS-B-6CS-B-6 (SUTURE) IMPLANT
SYR 3ML LL SCALE MARK (SYRINGE) IMPLANT
SYR 5ML LL (SYRINGE) IMPLANT

## 2023-04-25 NOTE — Anesthesia Postprocedure Evaluation (Signed)
Anesthesia Post Note  Patient: MAYUKHA GURULE  Procedure(s) Performed: CATARACT EXTRACTION PHACO AND INTRAOCULAR LENS PLACEMENT (IOC) LEFT DIABETIC 7.70 00:54.6 (Left: Eye)  Patient location during evaluation: PACU Anesthesia Type: MAC Level of consciousness: awake and alert Pain management: pain level controlled Vital Signs Assessment: post-procedure vital signs reviewed and stable Respiratory status: spontaneous breathing, nonlabored ventilation, respiratory function stable and patient connected to nasal cannula oxygen Cardiovascular status: stable and blood pressure returned to baseline Postop Assessment: no apparent nausea or vomiting Anesthetic complications: no   No notable events documented.   Last Vitals:  Vitals:   04/25/23 0752 04/25/23 0757  BP: (!) 164/76   Pulse: 85 79  Resp: 15 15  Temp: (!) 35.7 C (!) 36 C  SpO2: 99% 98%    Last Pain:  Vitals:   04/25/23 0757  TempSrc:   PainSc: 0-No pain                 Chestine Belknap C Shawnda Mauney

## 2023-04-25 NOTE — Op Note (Signed)
OPERATIVE NOTE  Angela Russell [id number] 04/25/2023   PREOPERATIVE DIAGNOSIS: Nuclear sclerotic cataract left eye. H25.12   POSTOPERATIVE DIAGNOSIS: Nuclear sclerotic cataract left eye. H25.12   PROCEDURE:  Phacoemusification with posterior chamber intraocular lens placement of the left eye  Ultrasound time: Procedure(s): CATARACT EXTRACTION PHACO AND INTRAOCULAR LENS PLACEMENT (IOC) LEFT DIABETIC 7.70 00:54.6 (Left)  LENS:   Implant Name Type Inv. Item Serial No. Manufacturer Lot No. LRB No. Used Action  LENS IOL TECNIS EYHANCE 24.5 - [id number] Intraocular Lens LENS IOL TECNIS EYHANCE 24.5 [id number] [employer]  Left 1 Implanted      SURGEON:  Julious Payer. Rolley Sims, MD   ANESTHESIA:  Topical with tetracaine drops, augmented with 1% preservative-free intracameral lidocaine.   COMPLICATIONS:  None.   DESCRIPTION OF PROCEDURE:  The patient was identified in the holding room and transported to the operating room and placed in the supine position under the operating microscope.  The left eye was identified as the operative eye, which was prepped and draped in the usual sterile ophthalmic fashion.   A 1 millimeter clear-corneal paracentesis was made inferotemporally. Preservative-free 1% lidocaine mixed with 1:1,000 bisulfite-free aqueous solution of epinephrine was injected into the anterior chamber. The anterior chamber was then filled with Viscoat viscoelastic. A 2.4 millimeter keratome was used to make a clear-corneal incision superotemporally. A curvilinear capsulorrhexis was made with a cystotome and capsulorrhexis forceps. Balanced salt solution was used to hydrodissect and hydrodelineate the nucleus. Phacoemulsification was then used to remove the lens nucleus and epinucleus. The remaining cortex was then removed using the irrigation and aspiration handpiece. Provisc was then placed into the capsular bag to distend it for lens placement. A +24.50 D DIB00 intraocular lens was then  injected into the capsular bag. The remaining viscoelastic was aspirated.   Wounds were hydrated with balanced salt solution.  The anterior chamber was inflated to a physiologic pressure with balanced salt solution.  No wound leaks were noted. Vigamox was injected intracamerally.  Timolol and Brimonidine drops were applied to the eye.  The patient was taken to the recovery room in stable condition without complications of anesthesia or surgery.  [name] 04/25/2023, 7:51 AM

## 2023-04-25 NOTE — Transfer of Care (Signed)
Immediate Anesthesia Transfer of Care Note  Patient: Angela Russell  Procedure(s) Performed: CATARACT EXTRACTION PHACO AND INTRAOCULAR LENS PLACEMENT (IOC) LEFT DIABETIC 7.70 00:54.6 (Left: Eye)  Patient Location: PACU  Anesthesia Type:MAC  Level of Consciousness: awake, alert , and oriented  Airway & Oxygen Therapy: Patient Spontanous Breathing  Post-op Assessment: Report given to RN, Post -op Vital signs reviewed and stable, and Patient moving all extremities X 4  Post vital signs: Reviewed and stable  Last Vitals:  Vitals Value Taken Time  BP    Temp    Pulse 85 04/25/23 0753  Resp 15 04/25/23 0753  SpO2 99 % 04/25/23 0753  Vitals shown include unfiled device data.  Last Pain:  Vitals:   04/25/23 0639  TempSrc: Temporal  PainSc: 0-No pain         Complications: No notable events documented.

## 2023-04-25 NOTE — H&P (Signed)
[facility]   Primary Care Physician:  Larae Grooms, NP Ophthalmologist: Dr. Deberah Pelton  Pre-Procedure History & Physical: HPI:  Angela Russell is a 72 y.o. female here for cataract surgery.   Past Medical History:  Diagnosis Date   Anemia in stage 4 chronic kidney disease (HCC)    Bladder carcinoma (HCC)    Carotid artery plaque, right 01/2014   CKD (chronic kidney disease)    stage 4-followed by nephrology   Controlled diabetes mellitus type 2 with complications (HCC)    Diabetes mellitus without complication (HCC)    DM (diabetes mellitus), type 2, uncontrolled    Ductal carcinoma in situ (DCIS) of breast    DVT (deep venous thrombosis) (HCC)    Hyperlipidemia    Hypertension    Hypochromic microcytic anemia    mild   Metastatic urothelial carcinoma (HCC) 03/23/2019   Osteoporosis    Parastomal hernia    Personal history of chemotherapy    for bladder cancer   Personal history of colonic polyps    Personal history of radiation therapy    Port-A-Cath in place    right side   Renal insufficiency    Rotator cuff tendonitis, right    Sepsis (HCC)    UTI (urinary tract infection) 07/03/2022   Wears dentures    full upper and lower    Past Surgical History:  Procedure Laterality Date   BREAST BIOPSY Left 06/13/2022   stereo biopsy/x clip/ positive   BREAST BIOPSY Left 06/13/2022   stereo biopsy/ ribbon clip/ negative   BREAST BIOPSY Left 06/13/2022   MM LT BREAST BX W LOC DEV EA AD LESION IMG BX SPEC STEREO GUIDE 06/13/2022 [facility]   BREAST BIOPSY Left 06/13/2022   MM LT BREAST BX W LOC DEV 1ST LESION IMAGE BX SPEC STEREO GUIDE 06/13/2022 [facility]   BREAST LUMPECTOMY Left 07/17/2022   BREAST LUMPECTOMY WITH RADIOFREQUENCY TAG IDENTIFICATION Left 07/17/2022   Procedure: BREAST LUMPECTOMY WITH RADIOFREQUENCY TAG IDENTIFICATION;  Surgeon: Henrene Dodge, MD;  Location: [facility];  Service: General;  Laterality: Left;   CATARACT EXTRACTION  W/PHACO Right 04/04/2023   Procedure: CATARACT EXTRACTION PHACO AND INTRAOCULAR LENS PLACEMENT (IOC) RIGHT DIABETIC 9.24 00:59.4;  Surgeon: Estanislado Pandy, MD;  Location: [facility];  Service: Ophthalmology;  Laterality: Right;   COLONOSCOPY WITH PROPOFOL N/A 03/17/2021   Procedure: COLONOSCOPY WITH PROPOFOL;  Surgeon: Wyline Mood, MD;  Location: [facility];  Service: Gastroenterology;  Laterality: N/A;   CYSTOSCOPY W/ RETROGRADES Bilateral 07/16/2018   Procedure: CYSTOSCOPY WITH RETROGRADE PYELOGRAM;  Surgeon: Sondra Come, MD;  Location: [facility];  Service: Urology;  Laterality: Bilateral;   ESOPHAGOGASTRODUODENOSCOPY  03/17/2021   Procedure: ESOPHAGOGASTRODUODENOSCOPY (EGD);  Surgeon: Wyline Mood, MD;  Location: [facility] ENDOSCOPY;  Service: Gastroenterology;;   GIVENS CAPSULE STUDY N/A 06/19/2021   Procedure: GIVENS CAPSULE STUDY;  Surgeon: Wyline Mood, MD;  Location: [facility] ENDOSCOPY;  Service: Gastroenterology;  Laterality: N/A;   IVC FILTER INSERTION N/A 03/30/2019   Procedure: IVC FILTER INSERTION;  Surgeon: Annice Needy, MD;  Location: [facility];  Service: Cardiovascular;  Laterality: N/A;   IVC FILTER REMOVAL N/A 06/15/2019   Procedure: IVC FILTER REMOVAL;  Surgeon: Annice Needy, MD;  Location: [facility];  Service: Cardiovascular;  Laterality: N/A;   KNEE SURGERY Left 03/17/2013   torn meniscus   PERIPHERAL VASCULAR THROMBECTOMY Right 03/30/2019   Procedure: PERIPHERAL VASCULAR THROMBECTOMY;  Surgeon: Annice Needy, MD;  Location: [facility];  Service: Cardiovascular;  Laterality: Right;   PORTA CATH INSERTION N/A 08/06/2018   Procedure: PORTA CATH INSERTION;  Surgeon: Annice Needy, MD;  Location: [facility];  Service: Cardiovascular;  Laterality: N/A;   TOTAL ABDOMINAL HYSTERECTOMY  2000   due to bleeding and fibroids, partial- still has ovaries   TRANSURETHRAL RESECTION OF BLADDER TUMOR N/A 07/16/2018   Procedure:  TRANSURETHRAL RESECTION OF BLADDER TUMOR (TURBT);  Surgeon: Sondra Come, MD;  Location: [facility];  Service: Urology;  Laterality: N/A;    Prior to Admission medications   Medication Sig Start Date End Date Taking? Authorizing Provider  acetaminophen (TYLENOL) 500 MG tablet Take 2 tablets (1,000 mg total) by mouth every 6 (six) hours as needed for mild pain. 07/17/22  Yes Piscoya, Elita Quick, MD  amLODipine (NORVASC) 10 MG tablet Take 1 tablet by mouth once daily 03/11/23  Yes Larae Grooms, NP  ferrous sulfate 324 MG TBEC Take 324 mg by mouth daily.   Yes [provider]  losartan (COZAAR) 25 MG tablet Take 1 tablet by mouth once daily 03/11/23  Yes Larae Grooms, NP  Omega-3 Fatty Acids (FISH OIL PO) Take 1 tablet by mouth daily at 6 (six) AM.   Yes [provider]  Semaglutide (RYBELSUS) 7 MG TABS Take 1 tablet (7 mg total) by mouth daily. 08/29/22  Yes Larae Grooms, NP  bisacodyl (DULCOLAX) 5 MG EC tablet Take 1 tablet (5 mg total) by mouth daily as needed for moderate constipation. 10/05/21   Pennie Banter, DO  docusate sodium (COLACE) 100 MG capsule Take 1 capsule (100 mg total) by mouth daily. Patient not taking: Reported on 03/28/2023 03/22/20   Rickard Patience, MD  polyethylene glycol (MIRALAX / GLYCOLAX) 17 g packet Take 17 g by mouth 2 (two) times daily. 10/05/21   Pennie Banter, DO  prochlorperazine (COMPAZINE) 10 MG tablet Take 1 tablet (10 mg total) by mouth every 6 (six) hours as needed (Nausea or vomiting). 08/03/18 08/26/18  Rickard Patience, MD    Allergies as of 03/21/2023   (No Known Allergies)    Family History  Problem Relation Age of Onset   Heart disease Mother    Heart attack Mother    Arthritis Father    Diabetes Brother    Breast cancer Neg Hx     Social History   Socioeconomic History   Marital status: Divorced    Spouse name: Not on file   Number of children: 1   Years of education: Not on file   Highest education level: 10th grade   Occupational History   Not on file  Tobacco Use   Smoking status: Former    Current packs/day: 0.00    Average packs/day: 1 pack/day for 10.0 years (10.0 ttl pk-yrs)    Types: Cigarettes    Start date: 06/04/1981    Quit date: 06/05/1991    Years since quitting: 31.9   Smokeless tobacco: Never  Vaping Use   Vaping status: Never Used  Substance and Sexual Activity   Alcohol use: No   Drug use: No   Sexual activity: Not Currently  Other Topics Concern   Not on file  Social History Narrative   Working full time   Social Determinants of Health   Financial Resource Strain: Low Risk  (04/09/2023)   Overall Financial Resource Strain (CARDIA)    Difficulty of Paying Living Expenses: Not hard at all  Food Insecurity: No Food Insecurity (04/09/2023)   Hunger  Vital Sign    Worried About Programme researcher, broadcasting/film/video in the Last Year: Never true    Ran Out of Food in the Last Year: Never true  Transportation Needs: No Transportation Needs (04/09/2023)   PRAPARE - Administrator, Civil Service (Medical): No    Lack of Transportation (Non-Medical): No  Physical Activity: Insufficiently Active (04/09/2023)   Exercise Vital Sign    Days of Exercise per Week: 3 days    Minutes of Exercise per Session: 30 min  Stress: No Stress Concern Present (04/09/2023)   [employer] of Occupational Health - Occupational Stress Questionnaire    Feeling of Stress : Not at all  Social Connections: Moderately Isolated (04/09/2023)   Social Connection and Isolation Panel [NHANES]    Frequency of Communication with Friends and Family: More than three times a week    Frequency of Social Gatherings with Friends and Family: More than three times a week    Attends Religious Services: More than 4 times per year    Active Member of [employer] or Organizations: No    Attends Banker Meetings: Never    Marital Status: Divorced  Catering manager Violence: Not At Risk (04/09/2023)   Humiliation, Afraid,  Rape, and Kick questionnaire    Fear of Current or Ex-Partner: No    Emotionally Abused: No    Physically Abused: No    Sexually Abused: No    Review of Systems: See HPI, otherwise negative ROS  Physical Exam: BP (!) 158/80   Temp (!) 97.2 F (36.2 C) (Temporal)   Resp 16   Ht 5' 3.5" (1.613 m)   Wt 98.4 kg   SpO2 100%   BMI 37.81 kg/m  General:   Alert, cooperative in NAD Head:  Normocephalic and atraumatic. Respiratory:  Normal work of breathing. Cardiovascular:  RRR  Impression/Plan: Angela Russell is here for cataract surgery.  Risks, benefits, limitations, and alternatives regarding cataract surgery have been reviewed with the patient.  Questions have been answered.  All parties agreeable.   Estanislado Pandy, MD  04/25/2023, 7:00 AM

## 2023-04-29 ENCOUNTER — Encounter: Payer: Medicare HMO | Admitting: Nurse Practitioner

## 2023-05-11 ENCOUNTER — Other Ambulatory Visit: Payer: Self-pay | Admitting: Nurse Practitioner

## 2023-05-14 ENCOUNTER — Ambulatory Visit
Admission: RE | Admit: 2023-05-14 | Discharge: 2023-05-14 | Disposition: A | Discharge status: Home / Self Care | Payer: Medicare HMO | Source: Ambulatory Visit | Attending: Radiation Oncology | Admitting: Radiation Oncology

## 2023-05-14 ENCOUNTER — Encounter: Payer: Self-pay | Admitting: Radiation Oncology

## 2023-05-14 VITALS — BP 173/81 | HR 98 | Temp 98.0°F | Resp 16 | Wt 217.0 lb

## 2023-05-14 DIAGNOSIS — D0512 Intraductal carcinoma in situ of left breast: Secondary | ICD-10-CM | POA: Diagnosis not present

## 2023-05-14 DIAGNOSIS — Z171 Estrogen receptor negative status [ER-]: Secondary | ICD-10-CM | POA: Diagnosis not present

## 2023-05-14 DIAGNOSIS — C50912 Malignant neoplasm of unspecified site of left female breast: Secondary | ICD-10-CM

## 2023-05-14 NOTE — Progress Notes (Signed)
Radiation Oncology Follow up Note  Name: Angela Russell   Date:   05/14/2023 MRN:  [id number] DOB: [birth year]    This 72 y.o. female presents to the clinic today for 26-month follow-up status post whole breast radiation to her left breast for ER negative ductal carcinoma in situ.  REFERRING PROVIDER: Larae Grooms, NP  HPI: Patient is a 72 year old female now out 6 months having completed whole breast radiation to her left breast for ER negative ductal carcinoma in situ.  Seen today in routine follow-up she is doing well.  She specifically denies breast tenderness cough or bone pain.  She is not on endocrine therapy based on her ER negative status of her DCIS.Marland Kitchen  She had a mammogram last month which I have reviewed was BI-RADS 2 benign.  She does have a history of bladder cancer status post cystectomy and ileal conduit creation.  Recent CT scan also shows no evidence of progressive disease in her chest abdomen or pelvis.  COMPLICATIONS OF TREATMENT: none  FOLLOW UP COMPLIANCE: keeps appointments   PHYSICAL EXAM:  BP (!) 173/81 Comment: patient monitors at home if it remain elevated she will contact PCP  Pulse 98   Temp 98 F (36.7 C) (Tympanic)   Resp 16   Wt 217 lb (98.4 kg)   BMI 37.83 kg/m  Well-developed well-nourished patient in NAD. HEENT reveals PERLA, EOMI, discs not visualized.  Oral cavity is clear. No oral mucosal lesions are identified. Neck is clear without evidence of cervical or supraclavicular adenopathy. Lungs are clear to A&P. Cardiac examination is essentially unremarkable with regular rate and rhythm without murmur rub or thrill. Abdomen is benign with no organomegaly or masses noted. Motor sensory and DTR levels are equal and symmetric in the upper and lower extremities. Cranial nerves II through XII are grossly intact. Proprioception is intact. No peripheral adenopathy or edema is identified. No motor or sensory levels are noted. Crude visual fields are within  normal range.  RADIOLOGY RESULTS: Mammograms and CT scan reviewed compatible with above-stated findings  PLAN: The present time patient is doing well 7 months out from whole breast radiation and pleased with her overall progress.  Of asked to see her back in 6 months for follow-up and then we will start once year follow-up visits.  Patient knows to call with any concerns she continues follow-up care with Dr. Cathie Hoops for her bladder cancer.  Patient knows to call with any concerns.  I would like to take this opportunity to thank you for allowing me to participate in the care of your patient.[name], MD

## 2023-05-14 NOTE — Telephone Encounter (Signed)
Requested medication (s) are due for refill today:   Yes  Requested medication (s) are on the active medication list:   Yes  Future visit scheduled:   Yes 12/26 with Clydie Braun   Last ordered: 03/11/2023 #30, 0 refills  Unable to refill since a 30 day courtesy refill was given in Oct.   Provider to review for refills prior to upcoming appt.      Requested Prescriptions  Pending Prescriptions Disp Refills   losartan (COZAAR) 25 MG tablet [Pharmacy Med Name: Losartan Potassium 25 MG Oral Tablet] 30 tablet 0    Sig: Take 1 tablet by mouth once daily     Cardiovascular:  Angiotensin Receptor Blockers Failed - 05/11/2023 11:43 AM      Failed - Cr in normal range and within 180 days    Creatinine  Date Value Ref Range Status  02/19/2023 1.63 (H) 0.44 - 1.00 mg/dL Final         Failed - Last BP in normal range    BP Readings from Last 1 Encounters:  04/25/23 (!) 164/76         Passed - K in normal range and within 180 days    Potassium  Date Value Ref Range Status  02/19/2023 3.9 3.5 - 5.1 mmol/L Final         Passed - Patient is not pregnant      Passed - Valid encounter within last 6 months    Recent Outpatient Visits           8 months ago Primary hypertension   [place] [facility] Larae Grooms, NP   10 months ago Pre-op exam   [place] [facility] Larae Grooms, NP   1 year ago Encounter for annual wellness visit (AWV) in Medicare patient   [place] [facility] Larae Grooms, NP   1 year ago Hypertension associated with diabetes [facility])   [place] [facility] Larae Grooms, NP   2 years ago Hypertension associated with diabetes [facility])   [place] [facility] Larae Grooms, NP       Future Appointments             In 2 weeks Larae Grooms, NP [facility]

## 2023-05-21 ENCOUNTER — Ambulatory Visit: Payer: Self-pay | Admitting: *Deleted

## 2023-05-21 NOTE — Telephone Encounter (Signed)
  Chief Complaint: Hypertension Symptoms: BP 180/90 today, max. HAs been trending up, 170-84-180/90. Slight headache in AMs only, resolves Frequency: 1-2 months Pertinent Negatives: Patient denies dizziness, weakness,visual changes Disposition: [] ED /[] Urgent Care (no appt availability in office) / [x] Appointment(In office/virtual)/ []  [place] [facility]/ [] Home Care/ [] Refused Recommended Disposition /[] [facility]/ []  Follow-up with PCP Additional Notes:  Appt secured according to pt's schedule, tomorrow. Care advise provided, pt verbalizes understanding.   At end of triage pt also mentioned malodorous urine. No associated symptoms.  Reason for Disposition  Systolic BP  >= 160 OR Diastolic >= 100  Answer Assessment - Initial Assessment Questions 1. BLOOD PRESSURE: "What is the blood pressure?" "Did you take at least two measurements 5 minutes apart?"     170/83  to 180/90 max 2. ONSET: "When did you take your blood pressure?"     Today 3. HOW: "How did you take your blood pressure?" (e.g., automatic home BP monitor, visiting nurse)     Home monitor kit 4. HISTORY: "Do you have a history of high blood pressure?"     Yes 5. MEDICINES: "Are you taking any medicines for blood pressure?" "Have you missed any doses recently?"     No 6. OTHER SYMPTOMS: "Do you have any symptoms?" (e.g., blurred vision, chest pain, difficulty breathing, headache, weakness)     Headache in AMs only           Been trending up a month  or two.  Protocols used: Blood Pressure - High-A-AH

## 2023-05-22 ENCOUNTER — Encounter: Payer: Self-pay | Admitting: Pediatrics

## 2023-05-22 ENCOUNTER — Ambulatory Visit (INDEPENDENT_AMBULATORY_CARE_PROVIDER_SITE_OTHER): Payer: Medicare HMO | Admitting: Pediatrics

## 2023-05-22 VITALS — BP 150/71 | HR 91 | Temp 98.1°F | Resp 15 | Wt 216.6 lb

## 2023-05-22 DIAGNOSIS — Z1330 Encounter for screening examination for mental health and behavioral disorders, unspecified: Secondary | ICD-10-CM

## 2023-05-22 DIAGNOSIS — R252 Cramp and spasm: Secondary | ICD-10-CM | POA: Diagnosis not present

## 2023-05-22 DIAGNOSIS — I10 Essential (primary) hypertension: Secondary | ICD-10-CM

## 2023-05-22 MED ORDER — OLMESARTAN MEDOXOMIL 20 MG PO TABS: 20.0000 mg | ORAL_TABLET | Freq: Every day | ORAL | 2 refills | Status: DC

## 2023-05-22 NOTE — Progress Notes (Signed)
Office Visit  BP (!) 150/71 (BP Location: Left Leg, Cuff Size: Normal)   Pulse 91   Temp 98.1 F (36.7 C) (Oral)   Resp 15   Wt 216 lb 9.6 oz (98.2 kg)   SpO2 98%   BMI 37.76 kg/m    Subjective:    Patient ID: Angela Russell, female    DOB: 01-17-1951, 72 y.o.   MRN: [id number]  HPI: Angela Russell is a 72 y.o. female  Chief Complaint  Patient presents with   Hypertension    Takes her medication but still remains elevated.    ostomy    Urine output has odor, darker than normal yellow    Discussed the use of AI scribe software for clinical note transcription with the patient, who gave verbal consent to proceed.  History of Present Illness   The patient, with a history of hypertension, presents with concerns of elevated blood pressure readings at home. She reports that her blood pressure, typically around 130, has recently increased to readings of 170/80-90. She describes experiencing slight headaches in the mornings, which tend to fade away as the day progresses. She has been on amlodipine and losartan for hypertension management for a long duration.  Additionally, the patient reports experiencing cramps, described as muscle spasms or a tightening sensation, akin to a 'charley horse' in the stomach. The frequency of these cramps is not specified, but she has not experienced any today. She speculates that these cramps may be related to late meals and possibly gas.  The patient also mentions a change in the odor of her urine, which has been ongoing for approximately two months. She denies any associated symptoms such as fever or chills. The patient has an ostomy, which she manages independently, and there have been no recent changes in its condition.      Relevant past medical, surgical, family and social history reviewed and updated as indicated. Interim medical history since our last visit reviewed. Allergies and medications reviewed and updated.  ROS per HPI unless  specifically indicated above     Objective:    BP (!) 150/71 (BP Location: Left Leg, Cuff Size: Normal)   Pulse 91   Temp 98.1 F (36.7 C) (Oral)   Resp 15   Wt 216 lb 9.6 oz (98.2 kg)   SpO2 98%   BMI 37.76 kg/m   Wt Readings from Last 3 Encounters:  05/22/23 216 lb 9.6 oz (98.2 kg)  05/14/23 217 lb (98.4 kg)  04/25/23 216 lb 14.4 oz (98.4 kg)     Physical Exam Constitutional:      Appearance: Normal appearance.  HENT:     Head: Normocephalic and atraumatic.  Eyes:     Pupils: Pupils are equal, round, and reactive to light.  Cardiovascular:     Rate and Rhythm: Normal rate and regular rhythm.     Pulses: Normal pulses.     Heart sounds: Normal heart sounds.  Pulmonary:     Effort: Pulmonary effort is normal.     Breath sounds: Normal breath sounds.  Abdominal:     General: Abdomen is flat.     Palpations: Abdomen is soft.     Comments: Ostomy bag in place, clean intact  Musculoskeletal:        General: Normal range of motion.     Cervical back: Normal range of motion.  Skin:    General: Skin is warm and dry.     Capillary Refill: Capillary refill takes less  than 2 seconds.  Neurological:     General: No focal deficit present.     Mental Status: She is alert. Mental status is at baseline.  Psychiatric:        Mood and Affect: Mood normal.        Behavior: Behavior normal.         05/22/2023    4:05 PM 04/09/2023    3:56 PM 08/24/2022    8:16 AM 07/03/2022    3:57 PM 02/22/2022    8:51 AM  Depression screen PHQ 2/9  Decreased Interest 0 0 0 0 0  Down, Depressed, Hopeless 0 0 0 0 0  PHQ - 2 Score 0 0 0 0 0  Altered sleeping 0 0 0 0 0  Tired, decreased energy 0 0 1 0 0  Change in appetite 0 0 0 0 0  Feeling bad or failure about yourself  0 0 0 0 0  Trouble concentrating 0 0 0 0 0  Moving slowly or fidgety/restless 0 0 0 0 0  Suicidal thoughts 0 0 0 0 0  PHQ-9 Score 0 0 1 0 0  Difficult doing work/chores Not difficult at all Not difficult at all Not  difficult at all Not difficult at all Not difficult at all       05/22/2023    4:05 PM 08/24/2022    8:16 AM 07/03/2022    3:57 PM 02/22/2022    8:51 AM  GAD 7 : Generalized Anxiety Score  Nervous, Anxious, on Edge 0 0 0 0  Control/stop worrying 0 0 0 0  Worry too much - different things 0 0 0 0  Trouble relaxing 0 0 0 0  Restless 0 0 0 0  Easily annoyed or irritable 0 0 0 1  Afraid - awful might happen 0 0 0 0  Total GAD 7 Score 0 0 0 1  Anxiety Difficulty Not difficult at all Not difficult at all Not difficult at all Not difficult at all       Assessment & Plan:  Assessment & Plan   Primary hypertension Assessment & Plan: Elevated home blood pressures (170/90-180/88) despite current regimen of Amlodipine and Losartan. Mild morning headaches reported. Discussed the short-acting nature of Losartan and potential for inconsistent blood pressure control. -Change Losartan to Olmesartan for longer acting coverage. -Check blood pressure with Clydie Braun at next week's appointment to assess response to medication change.  Orders: -     Olmesartan Medoxomil; Take 1 tablet (20 mg total) by mouth daily.  Dispense: 30 tablet; Refill: 2  Muscle cramping Reports of intermittent abdominal cramping, possibly related to positioning or delayed meals. No associated nausea. Electrolytes were normal at last check. Has ostomy in place. No recent changes or complications reported. Noted change in urine odor over the past two months, but no other symptoms suggestive of infection. Ostomy is well appearing and exam is unremarkable. If ongoing issue recommend she has specialty evaluation of abdomen/ostomy. -Consider use of over-the-counter Voltaren gel for symptomatic relief. -Check electrolytes today to ensure no significant changes. -     Basic metabolic panel  Encounter for behavioral health screening As part of their intake evaluation, the patient was screened for depression, anxiety.  PHQ9 SCORE 0, GAD7  SCORE 0. Screening results negative for tested conditions. Continue to monitor.  Follow up plan: Return for HTN.  Rahmel Nedved Howell Pringle, MD

## 2023-05-22 NOTE — Patient Instructions (Addendum)
Plan: - switch losartan to olmesartan 20mg 

## 2023-05-23 LAB — BASIC METABOLIC PANEL
BUN/Creatinine Ratio: 15 (ref 12–28)
BUN: 26 mg/dL (ref 8–27)
CO2: 21 mmol/L (ref 20–29)
Calcium: 9.3 mg/dL (ref 8.7–10.3)
Chloride: 107 mmol/L — ABNORMAL HIGH (ref 96–106)
Creatinine, Ser: 1.72 mg/dL — ABNORMAL HIGH (ref 0.57–1.00)
Glucose: 84 mg/dL (ref 70–99)
Potassium: 4.5 mmol/L (ref 3.5–5.2)
Sodium: 142 mmol/L (ref 134–144)
eGFR: 31 mL/min/{1.73_m2} — ABNORMAL LOW (ref >59)

## 2023-05-24 ENCOUNTER — Encounter: Payer: Self-pay | Admitting: Pediatrics

## 2023-05-24 NOTE — Assessment & Plan Note (Signed)
Elevated home blood pressures (170/90-180/88) despite current regimen of Amlodipine and Losartan. Mild morning headaches reported. Discussed the short-acting nature of Losartan and potential for inconsistent blood pressure control. -Change Losartan to Olmesartan for longer acting coverage. -Check blood pressure with Clydie Braun at next week's appointment to assess response to medication change.

## 2023-05-30 ENCOUNTER — Encounter: Payer: Self-pay | Admitting: Nurse Practitioner

## 2023-05-30 ENCOUNTER — Ambulatory Visit: Payer: Medicare HMO | Admitting: Nurse Practitioner

## 2023-05-30 VITALS — BP 142/77 | HR 99 | Temp 97.9°F | Ht 63.5 in | Wt 216.0 lb

## 2023-05-30 DIAGNOSIS — E782 Mixed hyperlipidemia: Secondary | ICD-10-CM | POA: Diagnosis not present

## 2023-05-30 DIAGNOSIS — D631 Anemia in chronic kidney disease: Secondary | ICD-10-CM | POA: Diagnosis not present

## 2023-05-30 DIAGNOSIS — D649 Anemia, unspecified: Secondary | ICD-10-CM

## 2023-05-30 DIAGNOSIS — R8281 Pyuria: Secondary | ICD-10-CM

## 2023-05-30 DIAGNOSIS — C679 Malignant neoplasm of bladder, unspecified: Secondary | ICD-10-CM

## 2023-05-30 DIAGNOSIS — C7910 Secondary malignant neoplasm of unspecified urinary organs: Secondary | ICD-10-CM

## 2023-05-30 DIAGNOSIS — N184 Chronic kidney disease, stage 4 (severe): Secondary | ICD-10-CM

## 2023-05-30 DIAGNOSIS — I10 Essential (primary) hypertension: Secondary | ICD-10-CM

## 2023-05-30 DIAGNOSIS — E118 Type 2 diabetes mellitus with unspecified complications: Secondary | ICD-10-CM | POA: Diagnosis not present

## 2023-05-30 DIAGNOSIS — Z7984 Long term (current) use of oral hypoglycemic drugs: Secondary | ICD-10-CM

## 2023-05-30 DIAGNOSIS — R8271 Bacteriuria: Secondary | ICD-10-CM

## 2023-05-30 DIAGNOSIS — Z0000 Encounter for general adult medical examination without abnormal findings: Secondary | ICD-10-CM | POA: Diagnosis not present

## 2023-05-30 DIAGNOSIS — Z23 Encounter for immunization: Secondary | ICD-10-CM | POA: Diagnosis not present

## 2023-05-30 LAB — MICROALBUMIN, URINE WAIVED
Creatinine, Urine Waived: 100 mg/dL (ref 10–300)
Microalb, Ur Waived: 150 mg/L — ABNORMAL HIGH (ref 0–19)
Microalb/Creat Ratio: >300 mg/g — ABNORMAL HIGH (ref <30)

## 2023-05-30 LAB — URINALYSIS, ROUTINE W REFLEX MICROSCOPIC
Bilirubin, UA: NEGATIVE
Glucose, UA: NEGATIVE
Ketones, UA: NEGATIVE
Nitrite, UA: POSITIVE — AB
Specific Gravity, UA: 1.015 (ref 1.005–1.030)
Urobilinogen, Ur: 0.2 mg/dL (ref 0.2–1.0)
pH, UA: 7.5 (ref 5.0–7.5)

## 2023-05-30 LAB — MICROSCOPIC EXAMINATION: RBC, Urine: NONE SEEN /[HPF] (ref 0–2)

## 2023-05-30 NOTE — Progress Notes (Signed)
BP (!) 142/77 (BP Location: Left Arm, Patient Position: Sitting, Cuff Size: Large)   Pulse 99   Temp 97.9 F (36.6 C) (Oral)   Ht 5' 3.5" (1.613 m)   Wt 216 lb (98 kg)   SpO2 98%   BMI 37.66 kg/m    Subjective:    Patient ID: Angela Russell, female    DOB: 02-May-1951, 72 y.o.   MRN: [id number]  HPI: Angela Russell is a 72 y.o. female presenting on 05/30/2023 for comprehensive medical examination. Current medical complaints include:needs form completed for DOT.  She currently lives with: Menopausal Symptoms: no  HYPERTENSION Hypertension status: controlled  Satisfied with current treatment? yes Duration of hypertension: years BP monitoring frequency:  rarely BP range: 140-150/70 BP medication side effects:  no Medication compliance: excellent compliance Previous BP meds:amlodipine and losartan (cozaar) Aspirin: no Recurrent headaches: no Visual changes: no Palpitations: no Dyspnea: no Chest pain: no Lower extremity edema: no Dizzy/lightheaded: no    DIABETES Hypoglycemic episodes:no Polydipsia/polyuria: no Visual disturbance: no Chest pain: no Paresthesias: no Glucose Monitoring: no             Accucheck frequency: Daily             Fasting glucose: 140-150             Post prandial:             Evening:             Before meals: Taking Insulin?: no             Long acting insulin:             Short acting insulin: Blood Pressure Monitoring: rarely Retinal Examination: Up to Date Foot Exam: Up to Date Diabetic Education: Not Completed Pneumovax: Up to Date Influenza: Up to Date Aspirin: yes   CHRONIC KIDNEY DISEASE Needs a new appt with Dr. Leta Speller CKD status: controlled Medications renally dose: yes Previous renal evaluation: yes Pneumovax:  Up to Date Influenza Vaccine:  Up to Date   ANEMIA Anemia status: stable Etiology of anemia:CKD Duration of anemia treatment:  Compliance with treatment: excellent compliance Iron supplementation side  effects: no Severity of anemia: moderate Fatigue: yes Decreased exercise tolerance: no  Dyspnea on exertion: no Palpitations: no Bleeding: no Pica: no  Depression Screen done today and results listed below:     05/30/2023    3:29 PM 05/22/2023    4:05 PM 04/09/2023    3:56 PM 08/24/2022    8:16 AM 07/03/2022    3:57 PM  Depression screen PHQ 2/9  Decreased Interest 0 0 0 0 0  Down, Depressed, Hopeless 0 0 0 0 0  PHQ - 2 Score 0 0 0 0 0  Altered sleeping 0 0 0 0 0  Tired, decreased energy 0 0 0 1 0  Change in appetite 0 0 0 0 0  Feeling bad or failure about yourself  0 0 0 0 0  Trouble concentrating 0 0 0 0 0  Moving slowly or fidgety/restless 0 0 0 0 0  Suicidal thoughts 0 0 0 0 0  PHQ-9 Score 0 0 0 1 0  Difficult doing work/chores  Not difficult at all Not difficult at all Not difficult at all Not difficult at all    The patient does not have a history of falls. I did complete a risk assessment for falls. A plan of care for falls was documented.   Past Medical History:  Past Medical History:  Diagnosis Date   Anemia in stage 4 chronic kidney disease (HCC)    Bladder carcinoma (HCC)    Carotid artery plaque, right 01/2014   CKD (chronic kidney disease)    stage 4-followed by nephrology   Controlled diabetes mellitus type 2 with complications (HCC)    Diabetes mellitus without complication (HCC)    DM (diabetes mellitus), type 2, uncontrolled    Ductal carcinoma in situ (DCIS) of breast    DVT (deep venous thrombosis) (HCC)    Hyperlipidemia    Hypertension    Hypochromic microcytic anemia    mild   Metastatic urothelial carcinoma (HCC) 03/23/2019   Osteoporosis    Parastomal hernia    Personal history of chemotherapy    for bladder cancer   Personal history of colonic polyps    Personal history of radiation therapy    Port-A-Cath in place    right side   Renal insufficiency    Rotator cuff tendonitis, right    Sepsis (HCC)    UTI (urinary tract infection)  07/03/2022   Wears dentures    full upper and lower    Surgical History:  Past Surgical History:  Procedure Laterality Date   BREAST BIOPSY Left 06/13/2022   stereo biopsy/x clip/ positive   BREAST BIOPSY Left 06/13/2022   stereo biopsy/ ribbon clip/ negative   BREAST BIOPSY Left 06/13/2022   MM LT BREAST BX W LOC DEV EA AD LESION IMG BX SPEC STEREO GUIDE 06/13/2022 [facility]   BREAST BIOPSY Left 06/13/2022   MM LT BREAST BX W LOC DEV 1ST LESION IMAGE BX SPEC STEREO GUIDE 06/13/2022 [facility]   BREAST LUMPECTOMY Left 07/17/2022   BREAST LUMPECTOMY WITH RADIOFREQUENCY TAG IDENTIFICATION Left 07/17/2022   Procedure: BREAST LUMPECTOMY WITH RADIOFREQUENCY TAG IDENTIFICATION;  Surgeon: Henrene Dodge, MD;  Location: [facility];  Service: General;  Laterality: Left;   CATARACT EXTRACTION W/PHACO Right 04/04/2023   Procedure: CATARACT EXTRACTION PHACO AND INTRAOCULAR LENS PLACEMENT (IOC) RIGHT DIABETIC 9.24 00:59.4;  Surgeon: Estanislado Pandy, MD;  Location: [facility];  Service: Ophthalmology;  Laterality: Right;   CATARACT EXTRACTION W/PHACO Left 04/25/2023   Procedure: CATARACT EXTRACTION PHACO AND INTRAOCULAR LENS PLACEMENT (IOC) LEFT DIABETIC 7.70 00:54.6;  Surgeon: Estanislado Pandy, MD;  Location: [facility];  Service: Ophthalmology;  Laterality: Left;   COLONOSCOPY WITH PROPOFOL N/A 03/17/2021   Procedure: COLONOSCOPY WITH PROPOFOL;  Surgeon: Wyline Mood, MD;  Location: [facility];  Service: Gastroenterology;  Laterality: N/A;   CYSTOSCOPY W/ RETROGRADES Bilateral 07/16/2018   Procedure: CYSTOSCOPY WITH RETROGRADE PYELOGRAM;  Surgeon: Sondra Come, MD;  Location: [facility];  Service: Urology;  Laterality: Bilateral;   ESOPHAGOGASTRODUODENOSCOPY  03/17/2021   Procedure: ESOPHAGOGASTRODUODENOSCOPY (EGD);  Surgeon: Wyline Mood, MD;  Location: [facility];  Service: Gastroenterology;;   GIVENS CAPSULE STUDY N/A 06/19/2021   Procedure: GIVENS  CAPSULE STUDY;  Surgeon: Wyline Mood, MD;  Location: [facility];  Service: Gastroenterology;  Laterality: N/A;   IVC FILTER INSERTION N/A 03/30/2019   Procedure: IVC FILTER INSERTION;  Surgeon: Annice Needy, MD;  Location: [facility];  Service: Cardiovascular;  Laterality: N/A;   IVC FILTER REMOVAL N/A 06/15/2019   Procedure: IVC FILTER REMOVAL;  Surgeon: Annice Needy, MD;  Location: [facility];  Service: Cardiovascular;  Laterality: N/A;   KNEE SURGERY Left 03/17/2013   torn meniscus   PERIPHERAL VASCULAR THROMBECTOMY Right 03/30/2019   Procedure: PERIPHERAL VASCULAR THROMBECTOMY;  Surgeon: Annice Needy,  MD;  Location: [facility];  Service: Cardiovascular;  Laterality: Right;   PORTA CATH INSERTION N/A 08/06/2018   Procedure: PORTA CATH INSERTION;  Surgeon: Annice Needy, MD;  Location: [facility];  Service: Cardiovascular;  Laterality: N/A;   TOTAL ABDOMINAL HYSTERECTOMY  2000   due to bleeding and fibroids, partial- still has ovaries   TRANSURETHRAL RESECTION OF BLADDER TUMOR N/A 07/16/2018   Procedure: TRANSURETHRAL RESECTION OF BLADDER TUMOR (TURBT);  Surgeon: Sondra Come, MD;  Location: [facility];  Service: Urology;  Laterality: N/A;    Medications:  Current Outpatient Medications on File Prior to Visit  Medication Sig   acetaminophen (TYLENOL) 500 MG tablet Take 2 tablets (1,000 mg total) by mouth every 6 (six) hours as needed for mild pain.   amLODipine (NORVASC) 10 MG tablet Take 1 tablet by mouth once daily   bisacodyl (DULCOLAX) 5 MG EC tablet Take 1 tablet (5 mg total) by mouth daily as needed for moderate constipation.   docusate sodium (COLACE) 100 MG capsule Take 1 capsule (100 mg total) by mouth daily.   ferrous sulfate 324 MG TBEC Take 324 mg by mouth daily.   Omega-3 Fatty Acids (FISH OIL PO) Take 1 tablet by mouth daily at 6 (six) AM.   polyethylene glycol (MIRALAX / GLYCOLAX) 17 g packet Take 17 g by mouth 2 (two) times  daily.   Semaglutide (RYBELSUS) 7 MG TABS Take 1 tablet (7 mg total) by mouth daily.   [DISCONTINUED] prochlorperazine (COMPAZINE) 10 MG tablet Take 1 tablet (10 mg total) by mouth every 6 (six) hours as needed (Nausea or vomiting).   Current Facility-Administered Medications on File Prior to Visit  Medication   heparin lock flush 100 UNIT/ML injection    Allergies:  No Known Allergies  Social History:  Social History   Socioeconomic History   Marital status: Divorced    Spouse name: Not on file   Number of children: 1   Years of education: Not on file   Highest education level: 10th grade  Occupational History   Not on file  Tobacco Use   Smoking status: Former    Current packs/day: 0.00    Average packs/day: 1 pack/day for 10.0 years (10.0 ttl pk-yrs)    Types: Cigarettes    Start date: 06/04/1981    Quit date: 06/05/1991    Years since quitting: 32.0   Smokeless tobacco: Never  Vaping Use   Vaping status: Never Used  Substance and Sexual Activity   Alcohol use: No   Drug use: No   Sexual activity: Not Currently  Other Topics Concern   Not on file  Social History Narrative   Working full time   Social Drivers of Corporate investment banker Strain: Low Risk  (04/09/2023)   Overall Financial Resource Strain (CARDIA)    Difficulty of Paying Living Expenses: Not hard at all  Food Insecurity: No Food Insecurity (04/09/2023)   Hunger Vital Sign    Worried About Running Out of Food in the Last Year: Never true    Ran Out of Food in the Last Year: Never true  Transportation Needs: No Transportation Needs (04/09/2023)   PRAPARE - Administrator, Civil Service (Medical): No    Lack of Transportation (Non-Medical): No  Physical Activity: Insufficiently Active (04/09/2023)   Exercise Vital Sign    Days of Exercise per Week: 3 days    Minutes of Exercise per Session: 30 min  Stress:  No Stress Concern Present (04/09/2023)   [employer] of Occupational Health -  Occupational Stress Questionnaire    Feeling of Stress : Not at all  Social Connections: Moderately Isolated (04/09/2023)   Social Connection and Isolation Panel [NHANES]    Frequency of Communication with Friends and Family: More than three times a week    Frequency of Social Gatherings with Friends and Family: More than three times a week    Attends Religious Services: More than 4 times per year    Active Member of [employer] or Organizations: No    Attends Banker Meetings: Never    Marital Status: Divorced  Catering manager Violence: Not At Risk (04/09/2023)   Humiliation, Afraid, Rape, and Kick questionnaire    Fear of Current or Ex-Partner: No    Emotionally Abused: No    Physically Abused: No    Sexually Abused: No   Social History   Tobacco Use  Smoking Status Former   Current packs/day: 0.00   Average packs/day: 1 pack/day for 10.0 years (10.0 ttl pk-yrs)   Types: Cigarettes   Start date: 06/04/1981   Quit date: 06/05/1991   Years since quitting: 32.0  Smokeless Tobacco Never   Social History   Substance and Sexual Activity  Alcohol Use No    Family History:  Family History  Problem Relation Age of Onset   Heart disease Mother    Heart attack Mother    Arthritis Father    Diabetes Brother    Breast cancer Neg Hx     Past medical history, surgical history, medications, allergies, family history and social history reviewed with patient today and changes made to appropriate areas of the chart.   Review of Systems  HENT:         Denies vision changes.  Eyes:  Negative for blurred vision and double vision.  Respiratory:  Negative for shortness of breath.   Cardiovascular:  Negative for chest pain, palpitations and leg swelling.  Neurological:  Negative for dizziness, tingling and headaches.  Endo/Heme/Allergies:  Negative for polydipsia.       Denies Polyuria   All other ROS negative except what is listed above and in the HPI.      Objective:    BP  (!) 142/77 (BP Location: Left Arm, Patient Position: Sitting, Cuff Size: Large)   Pulse 99   Temp 97.9 F (36.6 C) (Oral)   Ht 5' 3.5" (1.613 m)   Wt 216 lb (98 kg)   SpO2 98%   BMI 37.66 kg/m   Wt Readings from Last 3 Encounters:  05/30/23 216 lb (98 kg)  05/22/23 216 lb 9.6 oz (98.2 kg)  05/14/23 217 lb (98.4 kg)    Physical Exam Vitals and nursing note reviewed.  Constitutional:      General: She is awake. She is not in acute distress.    Appearance: Normal appearance. She is well-developed. She is obese. She is not ill-appearing.  HENT:     Head: Normocephalic and atraumatic.     Right Ear: Hearing, tympanic membrane, ear canal and external ear normal. No drainage.     Left Ear: Hearing, tympanic membrane, ear canal and external ear normal. No drainage.     Nose: Nose normal.     Right Sinus: No maxillary sinus tenderness or frontal sinus tenderness.     Left Sinus: No maxillary sinus tenderness or frontal sinus tenderness.     Mouth/Throat:     Mouth: Mucous membranes are moist.  Pharynx: Oropharynx is clear. Uvula midline. No pharyngeal swelling, oropharyngeal exudate or posterior oropharyngeal erythema.  Eyes:     General: Lids are normal.        Right eye: No discharge.        Left eye: No discharge.     Extraocular Movements: Extraocular movements intact.     Conjunctiva/sclera: Conjunctivae normal.     Pupils: Pupils are equal, round, and reactive to light.     Visual Fields: Right eye visual fields normal and left eye visual fields normal.  Neck:     Thyroid: No thyromegaly.     Vascular: No carotid bruit.     Trachea: Trachea normal.  Cardiovascular:     Rate and Rhythm: Normal rate and regular rhythm.     Heart sounds: Normal heart sounds. No murmur heard.    No gallop.  Pulmonary:     Effort: Pulmonary effort is normal. No accessory muscle usage or respiratory distress.     Breath sounds: Normal breath sounds.  Chest:  Breasts:    Right: Normal.      Left: Normal.  Abdominal:     General: Bowel sounds are normal.     Palpations: Abdomen is soft. There is no hepatomegaly or splenomegaly.     Tenderness: There is no abdominal tenderness.  Musculoskeletal:        General: Normal range of motion.     Cervical back: Normal range of motion and neck supple.     Right lower leg: No edema.     Left lower leg: No edema.  Lymphadenopathy:     Head:     Right side of head: No submental, submandibular, tonsillar, preauricular or posterior auricular adenopathy.     Left side of head: No submental, submandibular, tonsillar, preauricular or posterior auricular adenopathy.     Cervical: No cervical adenopathy.     Upper Body:     Right upper body: No supraclavicular, axillary or pectoral adenopathy.     Left upper body: No supraclavicular, axillary or pectoral adenopathy.  Skin:    General: Skin is warm and dry.     Capillary Refill: Capillary refill takes less than 2 seconds.     Findings: No rash.  Neurological:     Mental Status: She is alert and oriented to person, place, and time.     Gait: Gait is intact.  Psychiatric:        Attention and Perception: Attention normal.        Mood and Affect: Mood normal.        Speech: Speech normal.        Behavior: Behavior normal. Behavior is cooperative.        Thought Content: Thought content normal.        Judgment: Judgment normal.     Results for orders placed or performed in visit on 05/30/23  Microscopic Examination   Collection Time: 05/30/23  3:46 PM   Urine  Result Value Ref Range   WBC, UA 11-30 (A) 0 - 5 /hpf   RBC, Urine None seen 0 - 2 /hpf   Epithelial Cells (non renal) 0-10 0 - 10 /hpf   Bacteria, UA Many (A) None seen/Few  Urinalysis, Routine w reflex microscopic   Collection Time: 05/30/23  3:46 PM  Result Value Ref Range   Specific Gravity, UA 1.015 1.005 - 1.030   pH, UA 7.5 5.0 - 7.5   Color, UA Yellow Yellow   Appearance Ur Cloudy (A)  Clear   Leukocytes,UA 3+ (A)  Negative   Protein,UA 2+ (A) Negative/Trace   Glucose, UA Negative Negative   Ketones, UA Negative Negative   RBC, UA Trace (A) Negative   Bilirubin, UA Negative Negative   Urobilinogen, Ur 0.2 0.2 - 1.0 mg/dL   Nitrite, UA Positive (A) Negative   Microscopic Examination See below:   Microalbumin, Urine Waived   Collection Time: 05/30/23  3:46 PM  Result Value Ref Range   Microalb, Ur Waived 150 (H) 0 - 19 mg/L   Creatinine, Urine Waived 100 10 - 300 mg/dL   Microalb/Creat Ratio >300 (H) <30 mg/g  CBC with Differential/Platelet   Collection Time: 05/30/23  3:47 PM  Result Value Ref Range   WBC 5.3 3.4 - 10.8 x10E3/uL   RBC 4.16 3.77 - 5.28 x10E6/uL   Hemoglobin 9.9 (L) 11.1 - 15.9 g/dL   Hematocrit 16.1 (L) 09.6 - 46.6 %   MCV 78 (L) 79 - 97 fL   MCH 23.8 (L) 26.6 - 33.0 pg   MCHC 30.4 (L) 31.5 - 35.7 g/dL   RDW 04.5 40.9 - 81.1 %   Platelets 287 150 - 450 x10E3/uL   Neutrophils 67 Not Estab. %   Lymphs 24 Not Estab. %   Monocytes 7 Not Estab. %   Eos 2 Not Estab. %   Basos 0 Not Estab. %   Neutrophils Absolute 3.5 1.4 - 7.0 x10E3/uL   Lymphocytes Absolute 1.3 0.7 - 3.1 x10E3/uL   Monocytes Absolute 0.4 0.1 - 0.9 x10E3/uL   EOS (ABSOLUTE) 0.1 0.0 - 0.4 x10E3/uL   Basophils Absolute 0.0 0.0 - 0.2 x10E3/uL   Immature Granulocytes 0 Not Estab. %   Immature Grans (Abs) 0.0 0.0 - 0.1 x10E3/uL  Comprehensive metabolic panel   Collection Time: 05/30/23  3:47 PM  Result Value Ref Range   Glucose 103 (H) 70 - 99 mg/dL   BUN 26 8 - 27 mg/dL   Creatinine, Ser 9.14 (H) 0.57 - 1.00 mg/dL   eGFR 32 (L) >78 GN/FAO/1.30   BUN/Creatinine Ratio 15 12 - 28   Sodium 142 134 - 144 mmol/L   Potassium 4.4 3.5 - 5.2 mmol/L   Chloride 104 96 - 106 mmol/L   CO2 25 20 - 29 mmol/L   Calcium 9.2 8.7 - 10.3 mg/dL   Total Protein 7.0 6.0 - 8.5 g/dL   Albumin 4.1 3.8 - 4.8 g/dL   Globulin, Total 2.9 1.5 - 4.5 g/dL   Bilirubin Total <8.6 0.0 - 1.2 mg/dL   Alkaline Phosphatase 116 44 - 121 IU/L    AST 16 0 - 40 IU/L   ALT 11 0 - 32 IU/L  Lipid panel   Collection Time: 05/30/23  3:47 PM  Result Value Ref Range   Cholesterol, Total 179 100 - 199 mg/dL   Triglycerides 578 0 - 149 mg/dL   HDL 54 >46 mg/dL   VLDL Cholesterol Cal 19 5 - 40 mg/dL   LDL Chol Calc (NIH) 962 (H) 0 - 99 mg/dL   Chol/HDL Ratio 3.3 0.0 - 4.4 ratio  TSH   Collection Time: 05/30/23  3:47 PM  Result Value Ref Range   TSH 1.570 0.450 - 4.500 uIU/mL  HgB A1c   Collection Time: 05/30/23  3:47 PM  Result Value Ref Range   Hgb A1c MFr Bld 6.7 (H) 4.8 - 5.6 %   Est. average glucose Bld gHb Est-mCnc 146 mg/dL  Urine Culture   Collection Time: 05/31/23 12:06  PM   Specimen: Urine   UR  Result Value Ref Range   Urine Culture, Routine Preliminary report    Organism ID, Bacteria Comment       Assessment & Plan:   Problem List Items Addressed This Visit       Cardiovascular and Mediastinum   Hypertension   Elevated home blood pressures (140-150/70-80 despite current regimen of Amlodipine and Olmesartan 20mg .  -Increase  Olmesartan to 40mg  for longer acting coverage. Follow up in 1 week.  Patient needs blood pressure less than 140/90 to have DOT form signed.      Relevant Medications   olmesartan (BENICAR) 40 MG tablet     Endocrine   Controlled diabetes mellitus type 2 with complications (HCC)   Chronic.  Controlled.  Last A1c 7.8%.  Continue with current medication regimen of Rybelsus 7mg .   Patient needs updated eye exam.  Labs ordered today.  Follow up in 6 months. Call sooner if concerns arise.       Relevant Medications   olmesartan (BENICAR) 40 MG tablet   Other Relevant Orders   HgB A1c (Completed)   Microalbumin, Urine Waived (Completed)     Genitourinary   Bladder carcinoma (HCC) (Chronic)   Chronic.  Followed by Dr. Cathie Hoops at the cancer center. Continue to follow up with specialist.      Anemia in stage 4 chronic kidney disease (HCC) (Chronic)   Chronic.  Has been stable. Baseline  creatinine of 1.6.  Labs ordered today.  Followed by Nephrology.  Also followed at the Cancer center by Dr. Cathie Hoops.  Hemoglobin down to 9.9 at visit today.   Continue to follow up with specialists.      Metastatic urothelial carcinoma (HCC)     Other   Hyperlipidemia   Chronic.  Controlled.  Continue with current medication regimen.  Would benefit from statin therapy due to DM2. Labs ordered today.  Return to clinic in 6 months for reevaluation.  Call sooner if concerns arise.       Relevant Medications   olmesartan (BENICAR) 40 MG tablet   Other Relevant Orders   Lipid panel (Completed)   Other Visit Diagnoses       Annual physical exam    -  Primary   Health maintenance reviewed during visit today.  Labs ordered.  Vaccines reviewed.  Colonoscopy up to date.  Mammogram up to date.   Relevant Orders   CBC with Differential/Platelet (Completed)   Comprehensive metabolic panel (Completed)   Lipid panel (Completed)   TSH (Completed)   Urinalysis, Routine w reflex microscopic (Completed)   Flu Vaccine Trivalent High Dose (Fluad)     Pyuria       Relevant Orders   Urine Culture (Completed)     Need for influenza vaccination       Relevant Orders   Flu Vaccine Trivalent High Dose (Fluad)        Follow up plan: Return in about 3 months (around 08/28/2023) for BP Check.   LABORATORY TESTING:  - Pap smear: not applicable  IMMUNIZATIONS:   - Tdap: Tetanus vaccination status reviewed: Medicare. - Influenza: Refused - Pneumovax: Up to date - Prevnar: Up to date - COVID: Refused - HPV: Not applicable - Shingrix vaccine:  Discussed at visit today  SCREENING: -Mammogram: Up to date  - Colonoscopy: Up to date  - Bone Density: Not applicable  -Hearing Test: Not applicable  -Spirometry: Not applicable   PATIENT COUNSELING:   Advised to take  1 mg of folate supplement per day if capable of pregnancy.   Sexuality: Discussed sexually transmitted diseases, partner selection, use of  condoms, avoidance of unintended pregnancy  and contraceptive alternatives.   Advised to avoid cigarette smoking.  I discussed with the patient that most people either abstain from alcohol or drink within safe limits (<=14/week and <=4 drinks/occasion for males, <=7/weeks and <= 3 drinks/occasion for females) and that the risk for alcohol disorders and other health effects rises proportionally with the number of drinks per week and how often a drinker exceeds daily limits.  Discussed cessation/primary prevention of drug use and availability of treatment for abuse.   Diet: Encouraged to adjust caloric intake to maintain  or achieve ideal body weight, to reduce intake of dietary saturated fat and total fat, to limit sodium intake by avoiding high sodium foods and not adding table salt, and to maintain adequate dietary potassium and calcium preferably from fresh fruits, vegetables, and low-fat dairy products.    stressed the importance of regular exercise  Injury prevention: Discussed safety belts, safety helmets, smoke detector, smoking near bedding or upholstery.   Dental health: Discussed importance of regular tooth brushing, flossing, and dental visits.    NEXT PREVENTATIVE PHYSICAL DUE IN 1 YEAR. Return in about 3 months (around 08/28/2023) for BP Check.

## 2023-05-31 ENCOUNTER — Telehealth: Payer: Self-pay | Admitting: Nurse Practitioner

## 2023-05-31 DIAGNOSIS — R8281 Pyuria: Secondary | ICD-10-CM | POA: Diagnosis not present

## 2023-05-31 LAB — COMPREHENSIVE METABOLIC PANEL
ALT: 11 [IU]/L (ref 0–32)
AST: 16 [IU]/L (ref 0–40)
Albumin: 4.1 g/dL (ref 3.8–4.8)
Alkaline Phosphatase: 116 [IU]/L (ref 44–121)
BUN/Creatinine Ratio: 15 (ref 12–28)
BUN: 26 mg/dL (ref 8–27)
Bilirubin Total: <0.2 mg/dL (ref 0.0–1.2)
CO2: 25 mmol/L (ref 20–29)
Calcium: 9.2 mg/dL (ref 8.7–10.3)
Chloride: 104 mmol/L (ref 96–106)
Creatinine, Ser: 1.68 mg/dL — ABNORMAL HIGH (ref 0.57–1.00)
Globulin, Total: 2.9 g/dL (ref 1.5–4.5)
Glucose: 103 mg/dL — ABNORMAL HIGH (ref 70–99)
Potassium: 4.4 mmol/L (ref 3.5–5.2)
Sodium: 142 mmol/L (ref 134–144)
Total Protein: 7 g/dL (ref 6.0–8.5)
eGFR: 32 mL/min/{1.73_m2} — ABNORMAL LOW (ref >59)

## 2023-05-31 LAB — CBC WITH DIFFERENTIAL/PLATELET
Basophils Absolute: 0 10*3/uL (ref 0.0–0.2)
Basos: 0 %
EOS (ABSOLUTE): 0.1 10*3/uL (ref 0.0–0.4)
Eos: 2 %
Hematocrit: 32.6 % — ABNORMAL LOW (ref 34.0–46.6)
Hemoglobin: 9.9 g/dL — ABNORMAL LOW (ref 11.1–15.9)
Immature Grans (Abs): 0 10*3/uL (ref 0.0–0.1)
Immature Granulocytes: 0 %
Lymphocytes Absolute: 1.3 10*3/uL (ref 0.7–3.1)
Lymphs: 24 %
MCH: 23.8 pg — ABNORMAL LOW (ref 26.6–33.0)
MCHC: 30.4 g/dL — ABNORMAL LOW (ref 31.5–35.7)
MCV: 78 fL — ABNORMAL LOW (ref 79–97)
Monocytes Absolute: 0.4 10*3/uL (ref 0.1–0.9)
Monocytes: 7 %
Neutrophils Absolute: 3.5 10*3/uL (ref 1.4–7.0)
Neutrophils: 67 %
Platelets: 287 10*3/uL (ref 150–450)
RBC: 4.16 x10E6/uL (ref 3.77–5.28)
RDW: 13.3 % (ref 11.7–15.4)
WBC: 5.3 10*3/uL (ref 3.4–10.8)

## 2023-05-31 LAB — LIPID PANEL
Chol/HDL Ratio: 3.3 {ratio} (ref 0.0–4.4)
Cholesterol, Total: 179 mg/dL (ref 100–199)
HDL: 54 mg/dL (ref >39)
LDL Chol Calc (NIH): 106 mg/dL — ABNORMAL HIGH (ref 0–99)
Triglycerides: 108 mg/dL (ref 0–149)
VLDL Cholesterol Cal: 19 mg/dL (ref 5–40)

## 2023-05-31 LAB — HEMOGLOBIN A1C
Est. average glucose Bld gHb Est-mCnc: 146 mg/dL
Hgb A1c MFr Bld: 6.7 % — ABNORMAL HIGH (ref 4.8–5.6)

## 2023-05-31 LAB — TSH: TSH: 1.57 u[IU]/mL (ref 0.450–4.500)

## 2023-05-31 MED ORDER — OLMESARTAN MEDOXOMIL 40 MG PO TABS: 40.0000 mg | ORAL_TABLET | Freq: Every day | ORAL | 1 refills | Status: DC

## 2023-05-31 NOTE — Telephone Encounter (Unsigned)
Copied from CRM [id number]. Topic: General - Other >> May 30, 2023  5:17 PM Ja-Kwan M wrote: Reason for CRM: Pt state that she was told that a new blood pressure medication would be sent electronically to her pharmacy but the pharmacy has not received the Rx. Cb# [phone]

## 2023-05-31 NOTE — Telephone Encounter (Signed)
Pt called back regarding the new BP medication that was prescribed yesterday.    It hasn't been sent to the pharmacy  [facility]  [address]  CB#  [phone]

## 2023-05-31 NOTE — Telephone Encounter (Signed)
Called and LVM notifying patient that her provider is out sick today. Advised patient that she may send in her medication over the weekend and to please check with her pharmacy.

## 2023-05-31 NOTE — Telephone Encounter (Signed)
Routing to provider to advise.  

## 2023-06-03 NOTE — Assessment & Plan Note (Signed)
Chronic.  Has been stable. Baseline creatinine of 1.6.  Labs ordered today.  Followed by Nephrology.  Also followed at the Cancer center by Dr. Cathie Hoops.  Hemoglobin down to 9.9 at visit today.   Continue to follow up with specialists.

## 2023-06-03 NOTE — Assessment & Plan Note (Signed)
Elevated home blood pressures (140-150/70-80 despite current regimen of Amlodipine and Olmesartan 20mg .  -Increase  Olmesartan to 40mg  for longer acting coverage. Follow up in 1 week.  Patient needs blood pressure less than 140/90 to have DOT form signed.

## 2023-06-03 NOTE — Assessment & Plan Note (Signed)
Chronic.  Followed by Dr. Cathie [facility]ops [facility] the [facility]. Continue to follow up with specialist.

## 2023-06-03 NOTE — Telephone Encounter (Signed)
Called and spoke with patient. Advised her that the medication was sent in Friday for her to pick up.

## 2023-06-03 NOTE — Assessment & Plan Note (Signed)
Chronic.  Controlled.  Last A1c 7.8%.  Continue with current medication regimen of Rybelsus 7mg .   Patient needs updated eye exam.  Labs ordered today.  Follow up in 6 months. Call sooner if concerns arise.

## 2023-06-03 NOTE — Assessment & Plan Note (Signed)
Chronic.  Controlled.  Continue with current medication regimen.  Would benefit from statin therapy due to DM2. Labs ordered today.  Return to clinic in 6 months for reevaluation.  Call sooner if concerns arise.

## 2023-06-03 NOTE — Telephone Encounter (Signed)
Medication was sent in on Friday. Please make sure she was able to pick this up.

## 2023-06-04 LAB — URINE CULTURE

## 2023-06-04 NOTE — Addendum Note (Signed)
Addended by: Larae Grooms on: 06/04/2023 02:50 PM   Modules accepted: Orders

## 2023-06-06 ENCOUNTER — Ambulatory Visit (INDEPENDENT_AMBULATORY_CARE_PROVIDER_SITE_OTHER): Payer: Medicare HMO | Admitting: Nurse Practitioner

## 2023-06-06 ENCOUNTER — Encounter: Payer: Self-pay | Admitting: Nurse Practitioner

## 2023-06-06 VITALS — BP 138/68 | HR 96 | Temp 98.0°F | Ht 63.5 in | Wt 214.6 lb

## 2023-06-06 DIAGNOSIS — M1712 Unilateral primary osteoarthritis, left knee: Secondary | ICD-10-CM | POA: Diagnosis not present

## 2023-06-06 DIAGNOSIS — I10 Essential (primary) hypertension: Secondary | ICD-10-CM | POA: Diagnosis not present

## 2023-06-06 NOTE — Progress Notes (Signed)
 BP 138/68 (BP Location: Left Arm, Patient Position: Sitting)   Pulse 96   Temp 98 F (36.7 C) (Oral)   Ht 5' 3.5 (1.613 m)   Wt 214 lb 9.6 oz (97.3 kg)   SpO2 96%   BMI 37.42 kg/m    Subjective:    Patient ID: Angela Russell, female    DOB: [birth year], 73 y.o.   MRN: [id number]  HPI: Angela Russell is a 73 y.o. female  Chief Complaint  Patient presents with   Hypertension   HYPERTENSION with Chronic Kidney Disease Follow-up for BP check today.  Olmesartan  was increased to 40 MG on 05/30/23.  Continues on Amlodipine .  She reports better BP readings at home.  Hypertension status: stable  Satisfied with current treatment? yes Duration of hypertension: chronic BP monitoring frequency:  daily BP range: 120-130/70 range BP medication side effects:  no Medication compliance: good compliance Aspirin : no Recurrent headaches: no Visual changes: no Palpitations: no Dyspnea: no Chest pain: no Lower extremity edema: no Dizzy/lightheaded: no   Relevant past medical, surgical, family and social history reviewed and updated as indicated. Interim medical history since our last visit reviewed. Allergies and medications reviewed and updated.  Review of Systems  Constitutional:  Negative for activity change, appetite change, diaphoresis, fatigue and fever.  Respiratory:  Negative for cough, chest tightness and shortness of breath.   Cardiovascular:  Negative for chest pain, palpitations and leg swelling.  Gastrointestinal: Negative.   Neurological: Negative.   Psychiatric/Behavioral: Negative.      Per HPI unless specifically indicated above     Objective:    BP 138/68 (BP Location: Left Arm, Patient Position: Sitting)   Pulse 96   Temp 98 F (36.7 C) (Oral)   Ht 5' 3.5 (1.613 m)   Wt 214 lb 9.6 oz (97.3 kg)   SpO2 96%   BMI 37.42 kg/m   Wt Readings from Last 3 Encounters:  06/06/23 214 lb 9.6 oz (97.3 kg)  05/30/23 216 lb (98 kg)  05/22/23 216 lb 9.6 oz (98.2  kg)    Physical Exam Vitals and nursing note reviewed.  Constitutional:      General: She is awake. She is not in acute distress.    Appearance: She is well-developed and well-groomed. She is not ill-appearing or toxic-appearing.  HENT:     Head: Normocephalic.     Right Ear: Hearing and external ear normal.     Left Ear: Hearing and external ear normal.  Eyes:     General: Lids are normal.        Right eye: No discharge.        Left eye: No discharge.     Conjunctiva/sclera: Conjunctivae normal.     Pupils: Pupils are equal, round, and reactive to light.  Neck:     Thyroid : No thyromegaly.     Vascular: No carotid bruit.  Cardiovascular:     Rate and Rhythm: Normal rate and regular rhythm.     Heart sounds: Normal heart sounds. No murmur heard.    No gallop.  Pulmonary:     Effort: Pulmonary effort is normal. No accessory muscle usage or respiratory distress.     Breath sounds: Normal breath sounds.  Abdominal:     General: Bowel sounds are normal. There is no distension.     Palpations: Abdomen is soft.     Tenderness: There is no abdominal tenderness.  Musculoskeletal:     Cervical back: Normal range of motion and  neck supple.     Right lower leg: No edema.     Left lower leg: No edema.  Lymphadenopathy:     Cervical: No cervical adenopathy.  Skin:    General: Skin is warm and dry.  Neurological:     Mental Status: She is alert and oriented to person, place, and time.     Deep Tendon Reflexes: Reflexes are normal and symmetric.     Reflex Scores:      Brachioradialis reflexes are 2+ on the right side and 2+ on the left side.      Patellar reflexes are 2+ on the right side and 2+ on the left side. Psychiatric:        Attention and Perception: Attention normal.        Mood and Affect: Mood normal.        Speech: Speech normal.        Behavior: Behavior normal. Behavior is cooperative.        Thought Content: Thought content normal.     Results for orders placed  or performed in visit on 05/30/23  Microscopic Examination   Collection Time: 05/30/23  3:46 PM   Urine  Result Value Ref Range   WBC, UA 11-30 (A) 0 - 5 /hpf   RBC, Urine None seen 0 - 2 /hpf   Epithelial Cells (non renal) 0-10 0 - 10 /hpf   Bacteria, UA Many (A) None seen/Few  Urinalysis, Routine w reflex microscopic   Collection Time: 05/30/23  3:46 PM  Result Value Ref Range   Specific Gravity, UA 1.015 1.005 - 1.030   pH, UA 7.5 5.0 - 7.5   Color, UA Yellow Yellow   Appearance Ur Cloudy (A) Clear   Leukocytes,UA 3+ (A) Negative   Protein,UA 2+ (A) Negative/Trace   Glucose, UA Negative Negative   Ketones, UA Negative Negative   RBC, UA Trace (A) Negative   Bilirubin, UA Negative Negative   Urobilinogen, Ur 0.2 0.2 - 1.0 mg/dL   Nitrite, UA Positive (A) Negative   Microscopic Examination See below:   Microalbumin, Urine Waived   Collection Time: 05/30/23  3:46 PM  Result Value Ref Range   Microalb, Ur Waived 150 (H) 0 - 19 mg/L   Creatinine, Urine Waived 100 10 - 300 mg/dL   Microalb/Creat Ratio >300 (H) <30 mg/g  CBC with Differential/Platelet   Collection Time: 05/30/23  3:47 PM  Result Value Ref Range   WBC 5.3 3.4 - 10.8 x10E3/uL   RBC 4.16 3.77 - 5.28 x10E6/uL   Hemoglobin 9.9 (L) 11.1 - 15.9 g/dL   Hematocrit 67.3 (L) 65.9 - 46.6 %   MCV 78 (L) 79 - 97 fL   MCH 23.8 (L) 26.6 - 33.0 pg   MCHC 30.4 (L) 31.5 - 35.7 g/dL   RDW 86.6 88.2 - 84.5 %   Platelets 287 150 - 450 x10E3/uL   Neutrophils 67 Not Estab. %   Lymphs 24 Not Estab. %   Monocytes 7 Not Estab. %   Eos 2 Not Estab. %   Basos 0 Not Estab. %   Neutrophils Absolute 3.5 1.4 - 7.0 x10E3/uL   Lymphocytes Absolute 1.3 0.7 - 3.1 x10E3/uL   Monocytes Absolute 0.4 0.1 - 0.9 x10E3/uL   EOS (ABSOLUTE) 0.1 0.0 - 0.4 x10E3/uL   Basophils Absolute 0.0 0.0 - 0.2 x10E3/uL   Immature Granulocytes 0 Not Estab. %   Immature Grans (Abs) 0.0 0.0 - 0.1 x10E3/uL  Comprehensive metabolic panel  Collection Time:  05/30/23  3:47 PM  Result Value Ref Range   Glucose 103 (H) 70 - 99 mg/dL   BUN 26 8 - 27 mg/dL   Creatinine, Ser 8.31 (H) 0.57 - 1.00 mg/dL   eGFR 32 (L) >40 fO/fpw/8.26   BUN/Creatinine Ratio 15 12 - 28   Sodium 142 134 - 144 mmol/L   Potassium 4.4 3.5 - 5.2 mmol/L   Chloride 104 96 - 106 mmol/L   CO2 25 20 - 29 mmol/L   Calcium  9.2 8.7 - 10.3 mg/dL   Total Protein 7.0 6.0 - 8.5 g/dL   Albumin 4.1 3.8 - 4.8 g/dL   Globulin, Total 2.9 1.5 - 4.5 g/dL   Bilirubin Total <9.7 0.0 - 1.2 mg/dL   Alkaline Phosphatase 116 44 - 121 IU/L   AST 16 0 - 40 IU/L   ALT 11 0 - 32 IU/L  Lipid panel   Collection Time: 05/30/23  3:47 PM  Result Value Ref Range   Cholesterol, Total 179 100 - 199 mg/dL   Triglycerides 891 0 - 149 mg/dL   HDL 54 >60 mg/dL   VLDL Cholesterol Cal 19 5 - 40 mg/dL   LDL Chol Calc (NIH) 893 (H) 0 - 99 mg/dL   Chol/HDL Ratio 3.3 0.0 - 4.4 ratio  TSH   Collection Time: 05/30/23  3:47 PM  Result Value Ref Range   TSH 1.570 0.450 - 4.500 uIU/mL  HgB A1c   Collection Time: 05/30/23  3:47 PM  Result Value Ref Range   Hgb A1c MFr Bld 6.7 (H) 4.8 - 5.6 %   Est. average glucose Bld gHb Est-mCnc 146 mg/dL  Urine Culture   Collection Time: 05/31/23 12:06 PM   Specimen: Urine   UR  Result Value Ref Range   Urine Culture, Routine Final report (A)    Organism ID, Bacteria Klebsiella oxytoca (A)    ORGANISM ID, BACTERIA Comment    Antimicrobial Susceptibility Comment       Assessment & Plan:   Problem List Items Addressed This Visit       Cardiovascular and Mediastinum   Hypertension - Primary   Chronic, ongoing with some improvement on this check.  Initial elevated due to stress, but repeat at goal.  Continue current medication regimen and adjust as needed.  Recommend checking blood pressure at home a few days a week + focus on DASH diet.  Return as scheduled with PCP in March.        Follow up plan: Return for as scheduled March 21st.

## 2023-06-06 NOTE — Patient Instructions (Signed)

## 2023-06-06 NOTE — Assessment & Plan Note (Signed)
 Chronic, ongoing with some improvement on this check.  Initial elevated due to stress, but repeat at goal.  Continue current medication regimen and adjust as needed.  Recommend checking blood pressure at home a few days a week + focus on DASH diet.  Return as scheduled with PCP in March.

## 2023-06-07 ENCOUNTER — Ambulatory Visit: Payer: Medicare HMO | Admitting: Nurse Practitioner

## 2023-06-18 ENCOUNTER — Inpatient Hospital Stay: Payer: Medicare HMO | Attending: Oncology

## 2023-06-18 ENCOUNTER — Inpatient Hospital Stay (HOSPITAL_BASED_OUTPATIENT_CLINIC_OR_DEPARTMENT_OTHER): Payer: Medicare HMO | Admitting: Oncology

## 2023-06-18 ENCOUNTER — Encounter: Payer: Self-pay | Admitting: Oncology

## 2023-06-18 VITALS — BP 140/65 | HR 92 | Temp 96.8°F | Resp 18 | Wt 210.5 lb

## 2023-06-18 DIAGNOSIS — N184 Chronic kidney disease, stage 4 (severe): Secondary | ICD-10-CM | POA: Diagnosis not present

## 2023-06-18 DIAGNOSIS — D631 Anemia in chronic kidney disease: Secondary | ICD-10-CM | POA: Insufficient documentation

## 2023-06-18 DIAGNOSIS — Z90710 Acquired absence of both cervix and uterus: Secondary | ICD-10-CM | POA: Insufficient documentation

## 2023-06-18 DIAGNOSIS — Z8551 Personal history of malignant neoplasm of bladder: Secondary | ICD-10-CM | POA: Insufficient documentation

## 2023-06-18 DIAGNOSIS — E875 Hyperkalemia: Secondary | ICD-10-CM | POA: Diagnosis not present

## 2023-06-18 DIAGNOSIS — N1832 Chronic kidney disease, stage 3b: Secondary | ICD-10-CM

## 2023-06-18 DIAGNOSIS — Z95828 Presence of other vascular implants and grafts: Secondary | ICD-10-CM | POA: Diagnosis not present

## 2023-06-18 DIAGNOSIS — Z87891 Personal history of nicotine dependence: Secondary | ICD-10-CM | POA: Insufficient documentation

## 2023-06-18 DIAGNOSIS — D0512 Intraductal carcinoma in situ of left breast: Secondary | ICD-10-CM

## 2023-06-18 DIAGNOSIS — C679 Malignant neoplasm of bladder, unspecified: Secondary | ICD-10-CM

## 2023-06-18 DIAGNOSIS — Z923 Personal history of irradiation: Secondary | ICD-10-CM | POA: Insufficient documentation

## 2023-06-18 DIAGNOSIS — Z86000 Personal history of in-situ neoplasm of breast: Secondary | ICD-10-CM | POA: Insufficient documentation

## 2023-06-18 LAB — RETIC PANEL
Immature Retic Fract: 5.1 % (ref 2.3–15.9)
RBC.: 4.02 MIL/uL (ref 3.87–5.11)
Retic Count, Absolute: 39.8 10*3/uL (ref 19.0–186.0)
Retic Ct Pct: 1 % (ref 0.4–3.1)
Reticulocyte Hemoglobin: 27.1 pg — ABNORMAL LOW (ref >27.9)

## 2023-06-18 LAB — CBC WITH DIFFERENTIAL (CANCER CENTER ONLY)
Abs Immature Granulocytes: 0.04 10*3/uL (ref 0.00–0.07)
Basophils Absolute: 0 10*3/uL (ref 0.0–0.1)
Basophils Relative: 1 %
Eosinophils Absolute: 0.1 10*3/uL (ref 0.0–0.5)
Eosinophils Relative: 2 %
HCT: 31.5 % — ABNORMAL LOW (ref 36.0–46.0)
Hemoglobin: 9.7 g/dL — ABNORMAL LOW (ref 12.0–15.0)
Immature Granulocytes: 1 %
Lymphocytes Relative: 24 %
Lymphs Abs: 1.4 10*3/uL (ref 0.7–4.0)
MCH: 24 pg — ABNORMAL LOW (ref 26.0–34.0)
MCHC: 30.8 g/dL (ref 30.0–36.0)
MCV: 78 fL — ABNORMAL LOW (ref 80.0–100.0)
Monocytes Absolute: 0.4 10*3/uL (ref 0.1–1.0)
Monocytes Relative: 7 %
Neutro Abs: 4 10*3/uL (ref 1.7–7.7)
Neutrophils Relative %: 65 %
Platelet Count: 270 10*3/uL (ref 150–400)
RBC: 4.04 MIL/uL (ref 3.87–5.11)
RDW: 13.9 % (ref 11.5–15.5)
WBC Count: 6 10*3/uL (ref 4.0–10.5)
nRBC: 0 % (ref 0.0–0.2)

## 2023-06-18 LAB — CMP (CANCER CENTER ONLY)
ALT: 14 U/L (ref 0–44)
AST: 20 U/L (ref 15–41)
Albumin: 3.9 g/dL (ref 3.5–5.0)
Alkaline Phosphatase: 91 U/L (ref 38–126)
Anion gap: 9 (ref 5–15)
BUN: 43 mg/dL — ABNORMAL HIGH (ref 8–23)
CO2: 20 mmol/L — ABNORMAL LOW (ref 22–32)
Calcium: 9 mg/dL (ref 8.9–10.3)
Chloride: 108 mmol/L (ref 98–111)
Creatinine: 2.15 mg/dL — ABNORMAL HIGH (ref 0.44–1.00)
GFR, Estimated: 24 mL/min — ABNORMAL LOW (ref >60)
Glucose, Bld: 106 mg/dL — ABNORMAL HIGH (ref 70–99)
Potassium: 5.2 mmol/L — ABNORMAL HIGH (ref 3.5–5.1)
Sodium: 137 mmol/L (ref 135–145)
Total Bilirubin: 0.4 mg/dL (ref 0.0–1.2)
Total Protein: 7.5 g/dL (ref 6.5–8.1)

## 2023-06-18 LAB — IRON AND TIBC
Iron: 75 ug/dL (ref 28–170)
Saturation Ratios: 22 % (ref 10.4–31.8)
TIBC: 344 ug/dL (ref 250–450)
UIBC: 269 ug/dL

## 2023-06-18 LAB — FERRITIN: Ferritin: 165 ng/mL (ref 11–307)

## 2023-06-18 LAB — LACTATE DEHYDROGENASE: LDH: 130 U/L (ref 98–192)

## 2023-06-18 MED ORDER — HEPARIN SOD (PORK) LOCK FLUSH 100 UNIT/ML IV SOLN
500.0000 [IU] | Freq: Once | INTRAVENOUS | Status: AC
  Administered 2023-06-18: 500 [IU] via INTRAVENOUS
  Filled 2023-06-18: qty 5

## 2023-06-18 MED ORDER — SODIUM CHLORIDE 0.9% FLUSH
10.0000 mL | Freq: Once | INTRAVENOUS | Status: AC
  Administered 2023-06-18: 10 mL via INTRAVENOUS
  Filled 2023-06-18: qty 10

## 2023-06-18 NOTE — Assessment & Plan Note (Addendum)
 Avoid nephrotoxin.,  Encourage hydration. Creatine level is worse. Recommend pt to follow up with nephrology.  Results weill be faxed to nephrology.

## 2023-06-18 NOTE — Assessment & Plan Note (Addendum)
 Continue Port flush every 6-8 weeks  Discussed about option of medi port removal. She would like to keep the medi port until 5 years after NED on images. - March 2026

## 2023-06-18 NOTE — Progress Notes (Signed)
 Hematology/Oncology follow up note [phone] [phone]   Patient Care Team: Melvin Pao, NP as PCP - General Nice, Francis NOVAK, OD (Optometry) Babara Call, MD as Consulting Physician (Hematology and Oncology) Arloa Mliss RAMAN, [facility] (Inactive) (Pharmacist) Georgina Shasta POUR, RN as Oncology Nurse Navigator Voora, Aloysius LABOR, MD (Nephrology)  CHIEF COMPLAINTS/REASON FOR VISIT:  Follow up for bladder cancer, anemia due to CKD, ER negative DCIS  ASSESSMENT & PLAN:   Bladder carcinoma [facility]) #Metastatic urothelial carcinoma of bladder, sarcomatoid features pelvic sidewall mass biopsy showed metastatic carcinoma consistent with involvement by urothelial carcinoma., Stage IV NED- since 01/2020-patient was on Keytruda  every 3 to 4 weeks for 2.5 years.  Shared decision was made to stop treatment and continued surveillance. Labs reviewed and discussed with patient Stable CT abdomen and pelvis without contrast- NED   Ductal carcinoma in situ (DCIS) of left breast DCIS, ER negative. S/p lumpectomy.  S/p adjuvant radiation.  Recommend annual mammogram surveillance. - Nov 2024 - negative.   Anemia in stage 4 chronic kidney disease (HCC) Lab Results  Component Value Date   HGB 9.7 (L) 06/18/2023   TIBC 284 02/19/2023   IRONPCTSAT 23 02/19/2023   FERRITIN 146 02/19/2023    She declines IV Venofer  due to fatigue  side effects. Hb is stable.  Recommend patient to continue iron  supplementation    CKD (chronic kidney disease) Avoid nephrotoxin.,  Encourage hydration. Creatine level is worse. Recommend pt to follow up with nephrology.  Results weill be faxed to nephrology.   Port-A-Cath in place Continue Port flush every 6-8 weeks  Discussed about option of medi port removal. She would like to keep the medi port until 5 years after NED on images. - March 2026  Hyperkalemia Likely due to CKD.  Recommend low potassium food.  Follow up with nephrology   Orders Placed This  Encounter  Procedures   CBC with Differential (Cancer Center Only)    Standing Status:   Future    Expected Date:   09/16/2023    Expiration Date:   06/17/2024   CMP (Cancer Center only)    Standing Status:   Future    Expected Date:   09/16/2023    Expiration Date:   06/17/2024   Iron  and TIBC    Standing Status:   Future    Expected Date:   09/16/2023    Expiration Date:   06/17/2024   Ferritin    Standing Status:   Future    Expected Date:   09/16/2023    Expiration Date:   06/17/2024   Follow-up 3 months All questions were answered. The patient knows to call the clinic with any problems, questions or concerns.  Call Babara, MD, PhD [facility] Hematology Oncology 06/18/2023      HISTORY OF PRESENTING ILLNESS:  Oncology History  Bladder carcinoma (HCC)  07/01/2018 Imaging   CT renal stone study showed suspected irregular wall thickening about the superior bladder, not well assessed due to degree of bladder distention. Recommend cystoscopy for further evaluation    07/03/2018 Imaging   another CT abdomen pelvis with contrast was done which showed no nephrolithiasis or hydronephrosis is identified. Bladder is decompressed limiting evaluation.    07/16/2018 Initial Diagnosis   urology Dr. Sninsky - cystoscopy and bilateral retrograde pyelogram on 07/16/2018.  Pyelogram did not show any filling defect or abnormalities.  No hydronephrosis.  Ureteral orifice was not involved with tumor.  There is a large 5 cm posterior wall bladder tumor, bullous  and sessile appearing.  Patient underwent TURBT.    Pathology: High-grade urothelial carcinoma, invasive into muscularis propria.  Lymphovascular invasion is present.  Carcinoma in situ is also identified.  Focal squamous differentiation is noted, areas of invasive carcinoma display pleomorphic/sarcomatoid changes.  T2b     08/04/2018 -  Chemotherapy   Neoadjuvant DD MVAC x 1 cycle, stopped due to intolerance and acute kidney failure.  Patient was  sent to [facility] urology for evaluation of cystectomy   08/07/2018 Imaging   CT without contrast negative. 2 subpleural right upper lobe nodule 2 to 3 mm likely benign.    09/22/2018 Surgery    patient underwent a cystectomy, pathology pT3a N0 Mx.  11 lymph nodes were harvested and was all negative. Invasive urothelia carcinoma, high grade, with sarcomatoid features.    02/09/2019 -  Hospital Admission   Patient has had difficulties following up with [facility].  Presented emergency room on 02/09/2023 abdominal pain.  CT concerning for small bowel obstruction and increased size of right pelvic fluid collection concerning for cancer recurrence.  Right hydronephrosis to the level of pelvis and enlarged lymph nodes. Patient had JP drain placed with CT guidance to pelvic fluid collection and drained 400 cc amber fluid.  Culture was negative for growth of microorganisms and cytology was negative for malignancy.-JP drain was removed on the day of discharge. CT-guided core biopsy of pelvic lymph node adenopathy was attempted but not successful.  She also developed right lower extremity DVT provoked by transvenous biopsy.- Post biopsy acute thrombus in the right external iliac and common femoral vein.  Patient was recommended to start anticoagulation with Xarelto however due to the co-pay, patient is not able to afford the medication.   02/25/2019 Procedure   IR Transcaval pelvic mass biopsy at [facility] showed metastatic carcinoma, consistent with involvement by urothelial carcinoma. PD-L1-CPS 100%   03/16/2019 Imaging   03/16/2019 patient was found to have increased lower extremity swelling, it was found that she was actually taking Eliquis  2.5 mg twice daily.  Repeat right lower extremity ultrasound showed persistent extensive proximal right lower extremity DVT.  Anticoagulation regimen was changed to Eliquis  5 mg twice daily.   03/20/2019 Imaging   PET showed FDG avid tissue in the cystectomy bed, retroperitoneal and  pelvic lymphadenopathy. Right common iliac DVT.    03/30/2019 Procedure   Status post IVC filter placement, mechanical thrombectomy.-IVC filter was retrieved in January 2021.    03/31/2019 -  Chemotherapy   Keytruda  every 3 weeks.   04/26/2020 Imaging   CT chest abdomen pelvis was reviewed and discussed with patient. No definitive finding of disease recurrence or metastasis.  Small amount of ascites similar to prior.There is a parastomal hernia containing a small margin of adjacent small bowel.  This is associated with wall thickening in approximately 7 cm segment of bowel adjacent to the hernia.  There is adjacent abnormal stranding of the mesentery and omentum.   08/19/2020 Imaging   CT chest abdomen pelvis showed no evidence of new/progressive metastatic disease. NED   # Chronic abdominal discomfort due to hernia. 08/11/2020, patient was seen by Dr. Mannie for parastomal hernia.  Patient has no obstructive symptoms and would like to continue to monitor her symptoms and hold off any intervention at this point.  Patient was recommended to wear ostomy hernia belt and was referred to [facility] wound ostomy nursing team.   04/10/2021 Imaging   CT chest abdomen pelvis without contrast showed no evidence of disease recurrence. Small volume  fluid in the pelvis, decreased. Emphysema/diffuse bilateral bronchial wall thickening. CAD.    10/02/2021 - 10/05/2021 Hospital Admission   atient was admitted due to sepsis secondary to UTI. Patient also had CT chest angiogram done during the hospitalization which showed no PE or pneumonia.. Lower extremity ultrasound showed negative for DVT. Patient was provided supportive care IV fluid hydration, broad-spectrum antibiotics. COVID and influenza negative. Blood culture positive for E. coli. Patient was transitioned to oral antibiotics Levaquin  and completed 14 days of course.    05/10/2022 Imaging   CT chest abdomen pelvis with contrast showed 1. No findings of active  malignancy in the chest, abdomen, or pelvis. Stable perivascular density along the pelvic sidewalls likely from previous treated malignancy. 2. Prior cystectomy and ileal conduit. The ileal conduit is no longer as prominent in size as it was on 11/03/2021. There is some borderline hydroureter of the right mid ureter but no hydronephrosis. 3. Peristomal hernia along the urostomy site contains a loop of distal small bowel. No findings of strangulation or obstruction. 4. Aberrant right subclavian artery passes behind the esophagus.5. Degenerative arthropathy of both hips. Grade 1 degenerative anterolisthesis of L5 on S1. 6. Aortic atherosclerosis.   Metastatic urothelial carcinoma (HCC)  03/23/2019 Initial Diagnosis   Metastatic urothelial carcinoma (HCC)   03/31/2019 - 09/05/2021 Chemotherapy   Patient is on Treatment Plan : Bladder - Pembrolizumab  Q28D     Ductal carcinoma in situ (DCIS) of left breast  04/23/2022 Mammogram   Bilateral screening mammogram showed left breast calcifications warrant further evaluation.  Right breast no findings suspicious for malignancy.   06/01/2022 Mammogram   Unilateral diagnostic mammogram showed 2 groups of indeterminate calcifications in the left breast, 1 in the upper outer left breast spanning 1.3 cm and another in the central inferior left breast spanning 1.2 cm.   06/13/2022 Cancer Staging   Staging form: Breast, AJCC 8th Edition - Clinical stage from 06/13/2022: Stage 0 (cTis (DCIS), cN0, cM0, G3, ER: Unknown, PR: Not Assessed, HER2: Not Assessed) - Signed by Babara Call, MD on 06/26/2022 Stage prefix: Initial diagnosis Nuclear grade: G3 Histologic grading system: 3 grade system   06/13/2022 Initial Diagnosis   Ductal carcinoma in situ (DCIS) of left breast  06/13/2022, patient underwent left breast medial and lateral biopsy. Medial biopsy showed DCIS, grade 3, focal comedo type necrosis present.  Calcifications identified. Left lateral biopsy showed  breast parenchyma with focal fibrosis.  No significant atypia, calcifications identified.  Menarche 54 to 73 years of age History of hysterectomy in 2000. Previous use of OCP many years ago.  She is not able to recall how many years. Denies any hormone replacement therapy.  Denies previous chest radiation. Denies any family history of breast cancer.   07/17/2022 Surgery   S/p left breast lumpectomy  Pathology showed DCIS, Grade 3, all margins negative for DCIS ER negative [<1%]   09/21/2022 - 10/02/2022 Radiation Therapy   S/p adjuvant breast radiation.       INTERVAL HISTORY NIKE SOUTHERS is a 73 y.o. female who has above history reviewed by me today presents for evaluation of newly diagnosed left breast DCIS. Patient is known to me for history of metastatic high-grade urothelial carcinoma of the bladder, sarcomatoid feature, currently in remission.  She reports doing well currently.  She takes oral iron  supplementation. Constipation, she takes otc stool softner.  Review of Systems  Constitutional:  Negative for appetite change, chills, fatigue and fever.  HENT:   Negative for hearing loss  and voice change.   Eyes:  Negative for eye problems.  Respiratory:  Negative for chest tightness and cough.   Cardiovascular:  Negative for chest pain and leg swelling.  Gastrointestinal:  Negative for abdominal distention, abdominal pain, blood in stool and constipation.  Endocrine: Negative for hot flashes.  Genitourinary:  Negative for difficulty urinating and frequency.   Musculoskeletal:  Positive for arthralgias.  Skin:  Negative for itching and rash.  Neurological:  Positive for numbness. Negative for extremity weakness.  Hematological:  Negative for adenopathy.  Psychiatric/Behavioral:  Negative for confusion. The patient is not nervous/anxious.     MEDICAL HISTORY:  Past Medical History:  Diagnosis Date   Anemia in stage 4 chronic kidney disease (HCC)    Bladder carcinoma  (HCC)    Carotid artery plaque, right 01/2014   CKD (chronic kidney disease)    stage 4-followed by nephrology   Controlled diabetes mellitus type 2 with complications (HCC)    Diabetes mellitus without complication (HCC)    DM (diabetes mellitus), type 2, uncontrolled    Ductal carcinoma in situ (DCIS) of breast    DVT (deep venous thrombosis) (HCC)    Hyperlipidemia    Hypertension    Hypochromic microcytic anemia    mild   Metastatic urothelial carcinoma (HCC) 03/23/2019   Osteoporosis    Parastomal hernia    Personal history of chemotherapy    for bladder cancer   Personal history of colonic polyps    Personal history of radiation therapy    Port-A-Cath in place    right side   Renal insufficiency    Rotator cuff tendonitis, right    Sepsis (HCC)    UTI (urinary tract infection) 07/03/2022   Wears dentures    full upper and lower    SURGICAL HISTORY: Past Surgical History:  Procedure Laterality Date   BREAST BIOPSY Left 06/13/2022   stereo biopsy/x clip/ positive   BREAST BIOPSY Left 06/13/2022   stereo biopsy/ ribbon clip/ negative   BREAST BIOPSY Left 06/13/2022   MM LT BREAST BX W LOC DEV EA AD LESION IMG BX SPEC STEREO GUIDE 06/13/2022 [facility]   BREAST BIOPSY Left 06/13/2022   MM LT BREAST BX W LOC DEV 1ST LESION IMAGE BX SPEC STEREO GUIDE 06/13/2022 [facility]   BREAST LUMPECTOMY Left 07/17/2022   BREAST LUMPECTOMY WITH RADIOFREQUENCY TAG IDENTIFICATION Left 07/17/2022   Procedure: BREAST LUMPECTOMY WITH RADIOFREQUENCY TAG IDENTIFICATION;  Surgeon: Desiderio Schanz, MD;  Location: [facility];  Service: General;  Laterality: Left;   CATARACT EXTRACTION W/PHACO Right 04/04/2023   Procedure: CATARACT EXTRACTION PHACO AND INTRAOCULAR LENS PLACEMENT (IOC) RIGHT DIABETIC 9.24 00:59.4;  Surgeon: Enola Feliciano Hugger, MD;  Location: [facility];  Service: Ophthalmology;  Laterality: Right;   CATARACT EXTRACTION W/PHACO Left 04/25/2023   Procedure:  CATARACT EXTRACTION PHACO AND INTRAOCULAR LENS PLACEMENT (IOC) LEFT DIABETIC 7.70 00:54.6;  Surgeon: Enola Feliciano Hugger, MD;  Location: [facility];  Service: Ophthalmology;  Laterality: Left;   COLONOSCOPY WITH PROPOFOL  N/A 03/17/2021   Procedure: COLONOSCOPY WITH PROPOFOL ;  Surgeon: Therisa Bi, MD;  Location: [facility];  Service: Gastroenterology;  Laterality: N/A;   CYSTOSCOPY W/ RETROGRADES Bilateral 07/16/2018   Procedure: CYSTOSCOPY WITH RETROGRADE PYELOGRAM;  Surgeon: Francisca Redell BROCKS, MD;  Location: [facility];  Service: Urology;  Laterality: Bilateral;   ESOPHAGOGASTRODUODENOSCOPY  03/17/2021   Procedure: ESOPHAGOGASTRODUODENOSCOPY (EGD);  Surgeon: Therisa Bi, MD;  Location: [facility] ENDOSCOPY;  Service: Gastroenterology;;   GIVENS CAPSULE STUDY N/A 06/19/2021   Procedure:  GIVENS CAPSULE STUDY;  Surgeon: Therisa Bi, MD;  Location: [facility] ENDOSCOPY;  Service: Gastroenterology;  Laterality: N/A;   IVC FILTER INSERTION N/A 03/30/2019   Procedure: IVC FILTER INSERTION;  Surgeon: Marea Selinda RAMAN, MD;  Location: [facility];  Service: Cardiovascular;  Laterality: N/A;   IVC FILTER REMOVAL N/A 06/15/2019   Procedure: IVC FILTER REMOVAL;  Surgeon: Marea Selinda RAMAN, MD;  Location: [facility];  Service: Cardiovascular;  Laterality: N/A;   KNEE SURGERY Left 03/17/2013   torn meniscus   PERIPHERAL VASCULAR THROMBECTOMY Right 03/30/2019   Procedure: PERIPHERAL VASCULAR THROMBECTOMY;  Surgeon: Marea Selinda RAMAN, MD;  Location: [facility];  Service: Cardiovascular;  Laterality: Right;   PORTA CATH INSERTION N/A 08/06/2018   Procedure: PORTA CATH INSERTION;  Surgeon: Marea Selinda RAMAN, MD;  Location: [facility];  Service: Cardiovascular;  Laterality: N/A;   TOTAL ABDOMINAL HYSTERECTOMY  2000   due to bleeding and fibroids, partial- still has ovaries   TRANSURETHRAL RESECTION OF BLADDER TUMOR N/A 07/16/2018   Procedure: TRANSURETHRAL RESECTION OF BLADDER TUMOR (TURBT);   Surgeon: Francisca Redell BROCKS, MD;  Location: [facility];  Service: Urology;  Laterality: N/A;    SOCIAL HISTORY: Social History   Socioeconomic History   Marital status: Divorced    Spouse name: Not on file   Number of children: 1   Years of education: Not on file   Highest education level: 10th grade  Occupational History   Not on file  Tobacco Use   Smoking status: Former    Current packs/day: 0.00    Average packs/day: 1 pack/day for 10.0 years (10.0 ttl pk-yrs)    Types: Cigarettes    Start date: 06/04/1981    Quit date: 06/05/1991    Years since quitting: 32.0   Smokeless tobacco: Never  Vaping Use   Vaping status: Never Used  Substance and Sexual Activity   Alcohol use: No   Drug use: No   Sexual activity: Not Currently  Other Topics Concern   Not on file  Social History Narrative   Working full time   Social Drivers of Corporate Investment Banker Strain: Low Risk  (04/09/2023)   Overall Financial Resource Strain (CARDIA)    Difficulty of Paying Living Expenses: Not hard at all  Food Insecurity: No Food Insecurity (04/09/2023)   Hunger Vital Sign    Worried About Running Out of Food in the Last Year: Never true    Ran Out of Food in the Last Year: Never true  Transportation Needs: No Transportation Needs (04/09/2023)   PRAPARE - Administrator, Civil Service (Medical): No    Lack of Transportation (Non-Medical): No  Physical Activity: Insufficiently Active (04/09/2023)   Exercise Vital Sign    Days of Exercise per Week: 3 days    Minutes of Exercise per Session: 30 min  Stress: No Stress Concern Present (04/09/2023)   [employer] of Occupational Health - Occupational Stress Questionnaire    Feeling of Stress : Not at all  Social Connections: Moderately Isolated (04/09/2023)   Social Connection and Isolation Panel [NHANES]    Frequency of Communication with Friends and Family: More than three times a week    Frequency of Social Gatherings with Friends  and Family: More than three times a week    Attends Religious Services: More than 4 times per year    Active Member of [employer] or Organizations: No    Attends Banker  Meetings: Never    Marital Status: Divorced  Catering Manager Violence: Not At Risk (04/09/2023)   Humiliation, Afraid, Rape, and Kick questionnaire    Fear of Current or Ex-Partner: No    Emotionally Abused: No    Physically Abused: No    Sexually Abused: No    FAMILY HISTORY: Family History  Problem Relation Age of Onset   Heart disease Mother    Heart attack Mother    Arthritis Father    Diabetes Brother    Breast cancer Neg Hx     ALLERGIES:  has no known allergies.  MEDICATIONS:  Current Outpatient Medications  Medication Sig Dispense Refill   acetaminophen  (TYLENOL ) 500 MG tablet Take 2 tablets (1,000 mg total) by mouth every 6 (six) hours as needed for mild pain.     amLODipine  (NORVASC ) 10 MG tablet Take 1 tablet by mouth once daily 30 tablet 0   bisacodyl  (DULCOLAX) 5 MG EC tablet Take 1 tablet (5 mg total) by mouth daily as needed for moderate constipation. 30 tablet 0   docusate sodium  (COLACE) 100 MG capsule Take 1 capsule (100 mg total) by mouth daily. 90 capsule 0   ferrous sulfate  324 MG TBEC Take 324 mg by mouth daily.     ketorolac (ACULAR) 0.5 % ophthalmic solution 1 drop as directed.     olmesartan  (BENICAR ) 40 MG tablet Take 1 tablet (40 mg total) by mouth daily. 90 tablet 1   Omega-3 Fatty Acids (FISH OIL PO) Take 1 tablet by mouth daily at 6 (six) AM.     polyethylene glycol (MIRALAX  / GLYCOLAX ) 17 g packet Take 17 g by mouth 2 (two) times daily. 14 each 0   prednisoLONE acetate (PRED FORTE) 1 % ophthalmic suspension 1 drop as directed.     Semaglutide  (RYBELSUS ) 7 MG TABS Take 1 tablet (7 mg total) by mouth daily. 90 tablet 1   No current facility-administered medications for this visit.   Facility-Administered Medications Ordered in Other Visits  Medication Dose Route  Frequency Provider Last Rate Last Admin   heparin  lock flush 100 UNIT/ML injection              PHYSICAL EXAMINATION: ECOG PERFORMANCE STATUS: 1 - Symptomatic but completely ambulatory There were no vitals filed for this visit.  There were no vitals filed for this visit.   Physical Exam Constitutional:      General: She is not in acute distress.    Appearance: She is obese.     Comments: Walk independently  HENT:     Head: Normocephalic and atraumatic.  Eyes:     General: No scleral icterus.    Pupils: Pupils are equal, round, and reactive to light.  Cardiovascular:     Rate and Rhythm: Normal rate and regular rhythm.     Heart sounds: Normal heart sounds.  Pulmonary:     Effort: Pulmonary effort is normal. No respiratory distress.     Breath sounds: No wheezing.  Abdominal:     General: Bowel sounds are normal. There is no distension.     Palpations: Abdomen is soft.     Comments: Ureterostomy,   Musculoskeletal:        General: No deformity. Normal range of motion.     Cervical back: Normal range of motion and neck supple.  Skin:    General: Skin is warm and dry.     Coloration: Skin is not pale.     Findings: No erythema or rash.  Neurological:     Mental Status: She is alert and oriented to person, place, and time. Mental status is at baseline.     Cranial Nerves: No cranial nerve deficit.     Coordination: Coordination normal.  Psychiatric:        Mood and Affect: Mood normal.   . LABORATORY DATA:  I have reviewed the data as listed    Latest Ref Rng & Units 05/30/2023    3:47 PM 02/19/2023    9:33 AM 10/16/2022    1:44 PM  CBC  WBC 3.4 - 10.8 x10E3/uL 5.3  6.1  4.9   Hemoglobin 11.1 - 15.9 g/dL 9.9  9.4  9.5   Hematocrit 34.0 - 46.6 % 32.6  31.2  31.3   Platelets 150 - 450 x10E3/uL 287  246  206       Latest Ref Rng & Units 05/30/2023    3:47 PM 05/22/2023    4:25 PM 02/19/2023    9:33 AM  CMP  Glucose 70 - 99 mg/dL 896  84  827   BUN 8 - 27 mg/dL  26  26  34   Creatinine 0.57 - 1.00 mg/dL 8.31  8.27  8.36   Sodium 134 - 144 mmol/L 142  142  136   Potassium 3.5 - 5.2 mmol/L 4.4  4.5  3.9   Chloride 96 - 106 mmol/L 104  107  110   CO2 20 - 29 mmol/L 25  21  20    Calcium  8.7 - 10.3 mg/dL 9.2  9.3  8.6   Total Protein 6.0 - 8.5 g/dL 7.0   7.6   Total Bilirubin 0.0 - 1.2 mg/dL <9.7   0.1   Alkaline Phos 44 - 121 IU/L 116   111   AST 0 - 40 IU/L 16   22   ALT 0 - 32 IU/L 11   16      Lab Results  Component Value Date   IRON  65 02/19/2023   TIBC 284 02/19/2023   FERRITIN 146 02/19/2023    RADIOGRAPHIC STUDIES: I have personally reviewed the radiological images as listed and agreed with the findings in the report. CT CHEST ABDOMEN PELVIS WO CONTRAST Result Date: 05/06/2023 CLINICAL DATA:  Metastatic bladder cancer.  * Tracking Code: BO * EXAM: CT CHEST, ABDOMEN AND PELVIS WITHOUT CONTRAST TECHNIQUE: Multidetector CT imaging of the chest, abdomen and pelvis was performed following the standard protocol without IV contrast. RADIATION DOSE REDUCTION: This exam was performed according to the departmental dose-optimization program which includes automated exposure control, adjustment of the mA and/or kV according to patient size and/or use of iterative reconstruction technique. COMPARISON:  05/29/2022 FINDINGS: CT CHEST FINDINGS Cardiovascular: Right Port-A-Cath tip mid right atrium. Aberrant right subclavian artery traversing posterior to the esophagus. Aortic atherosclerosis. Tortuous thoracic aorta. Mild cardiomegaly, without pericardial effusion. Three vessel coronary artery calcification. Mediastinum/Nodes: No supraclavicular adenopathy. No axillary adenopathy. No mediastinal or hilar adenopathy, given limitations of unenhanced CT. Lungs/Pleura: No pleural fluid.  Mild centrilobular emphysema. 3 mm nodule on the right minor fissure on 51/4 is unchanged and likely a subpleural lymph node. Mild right middle lobe volume loss and scarring. Right  lower lobe hyperattenuating linear densities including on 97/4 are similar and likely related to contrast aspiration versus less likely old granulomatous disease. 2 mm subpleural right upper lobe pulmonary nodule on 31/4 is unchanged. Musculoskeletal: Included within the abdomen pelvic section. CT ABDOMEN PELVIS FINDINGS Hepatobiliary: Normal liver. Normal gallbladder, without biliary ductal  dilatation. Pancreas: Normal, without mass or ductal dilatation. Spleen: Normal in size, without focal abnormality. Adrenals/Urinary Tract: Left adrenal thickening. Normal right adrenal gland. No renal calculi or hydronephrosis. Cystectomy with ileal conduit creation. Parastomal hernia contains nonobstructive small bowel and is similar. Stomach/Bowel: Normal stomach, without wall thickening. Otherwise normal small bowel. Normal colon, appendix, and terminal ileum. Vascular/Lymphatic: Advanced aortic and branch vessel atherosclerosis. Right external iliac venous stent. Soft tissue fullness along the right pelvic sidewall measures on the order of 2.1 cm on 95/2, similar to on the prior. Soft tissue density along the left external iliac vessels is similar at 8 mm on 94/2. Reproductive: Hysterectomy.  No adnexal mass. Other: No significant free fluid.  No free intraperitoneal air. Musculoskeletal: Mild left hip osteoarthritis. Trace L5-S1 anterolisthesis. Transitional S1 anatomy. IMPRESSION: 1. Status post cystectomy and ileal conduit creation with similar small bowel containing peristomal hernia. 2. Right external iliac venous stent with right greater than left pelvic sidewall soft tissue fullness, similar to the prior and likely due to treated tumor. 3. Incidental findings, including: Aortic atherosclerosis (ICD10-I70.0), coronary artery atherosclerosis and emphysema (ICD10-J43.9). Electronically Signed   By: Rockey Kilts M.D.   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 Patient underwent a second stereotactic guided biopsy at [facility] clip which demonstrated benign results. Status post radiation. Close margins of DCIS. EXAM: DIGITAL DIAGNOSTIC BILATERAL MAMMOGRAM WITH TOMOSYNTHESIS AND CAD TECHNIQUE: Bilateral digital diagnostic mammography and breast tomosynthesis was performed. The images were evaluated with computer-aided detection. COMPARISON:  Previous exam(s). ACR Breast Density Category b: There are scattered areas of fibroglandular density. FINDINGS: There is density and architectural distortion within the LEFT breast, consistent with postsurgical changes. These are new in comparison to prior. No suspicious mass, distortion, or microcalcifications are identified to suggest presence of malignancy. IMPRESSION: No mammographic evidence of malignancy bilaterally. RECOMMENDATION: Recommend bilateral diagnostic mammogram (with RIGHT and LEFT breast ultrasound if deemed necessary) in 1 year. I have discussed the findings and recommendations with the patient. If applicable, a reminder letter will be sent to the patient regarding the next appointment. BI-RADS CATEGORY  2: Benign. Electronically Signed   By: Corean Salter M.D.   On: 04/23/2023 15:20

## 2023-06-18 NOTE — Assessment & Plan Note (Addendum)
#  Metastatic urothelial carcinoma of bladder, sarcomatoid features pelvic sidewall mass biopsy showed metastatic carcinoma consistent with involvement by urothelial carcinoma., Stage IV NED- since 01/2020-patient was on Keytruda  every 3 to 4 weeks for 2.5 years.  Shared decision was made to stop treatment and continued surveillance. Labs reviewed and discussed with patient Stable CT abdomen and pelvis without contrast- NED

## 2023-06-18 NOTE — Assessment & Plan Note (Addendum)
 Lab Results  Component Value Date   HGB 9.7 (L) 06/18/2023   TIBC 284 02/19/2023   IRONPCTSAT 23 02/19/2023   FERRITIN 146 02/19/2023    She declines IV Venofer  due to fatigue  side effects. Hb is stable.  Recommend patient to continue iron  supplementation

## 2023-06-18 NOTE — Assessment & Plan Note (Addendum)
 DCIS, ER negative. S/p lumpectomy.  S/p adjuvant radiation.  Recommend annual mammogram surveillance. - Nov 2024 - negative.

## 2023-06-18 NOTE — Assessment & Plan Note (Signed)
 Likely due to CKD.  Recommend low potassium food.  Follow up with nephrology

## 2023-06-19 ENCOUNTER — Telehealth: Payer: Self-pay

## 2023-06-19 NOTE — Telephone Encounter (Signed)
 Spoke to pt and informed her of MD recommendation to continue oral iron  supplementation, encourage hydration, avoid nephrotoxins and follow up with nephrology. Pt verbalized understanding.

## 2023-06-19 NOTE — Telephone Encounter (Signed)
-----   Message from Timmy Forbes sent at 06/18/2023  9:45 PM EST ----- Please let patient know that her iron  level is stable. Continue oral iron  supplementation.  Kidney function is worse. Encourage oral hydration and avoid nephrotoxins.  Recommend her to follow up with nephrology

## 2023-07-16 ENCOUNTER — Encounter: Payer: Self-pay | Admitting: Nurse Practitioner

## 2023-07-30 ENCOUNTER — Inpatient Hospital Stay: Payer: Medicare HMO | Attending: Oncology

## 2023-07-30 DIAGNOSIS — Z95828 Presence of other vascular implants and grafts: Secondary | ICD-10-CM

## 2023-07-30 DIAGNOSIS — Z452 Encounter for adjustment and management of vascular access device: Secondary | ICD-10-CM | POA: Insufficient documentation

## 2023-07-30 DIAGNOSIS — Z8551 Personal history of malignant neoplasm of bladder: Secondary | ICD-10-CM | POA: Diagnosis not present

## 2023-07-30 MED ORDER — HEPARIN SOD (PORK) LOCK FLUSH 100 UNIT/ML IV SOLN
500.0000 [IU] | Freq: Once | INTRAVENOUS | Status: AC
  Administered 2023-07-30: 500 [IU] via INTRAVENOUS
  Filled 2023-07-30: qty 5

## 2023-08-01 ENCOUNTER — Ambulatory Visit: Payer: Medicare HMO | Admitting: Urology

## 2023-08-01 ENCOUNTER — Encounter: Payer: Self-pay | Admitting: Urology

## 2023-08-01 VITALS — BP 153/74 | HR 108 | Ht 63.0 in | Wt 210.0 lb

## 2023-08-01 DIAGNOSIS — R8271 Bacteriuria: Secondary | ICD-10-CM

## 2023-08-01 DIAGNOSIS — C7910 Secondary malignant neoplasm of unspecified urinary organs: Secondary | ICD-10-CM

## 2023-08-01 DIAGNOSIS — Z9889 Other specified postprocedural states: Secondary | ICD-10-CM | POA: Diagnosis not present

## 2023-08-01 LAB — URINALYSIS, COMPLETE
Bilirubin, UA: NEGATIVE
Glucose, UA: NEGATIVE
Ketones, UA: NEGATIVE
Nitrite, UA: POSITIVE — AB
Specific Gravity, UA: 1.02 (ref 1.005–1.030)
Urobilinogen, Ur: 0.2 mg/dL (ref 0.2–1.0)
pH, UA: 6 (ref 5.0–7.5)

## 2023-08-01 LAB — MICROSCOPIC EXAMINATION
RBC, Urine: >30 /[HPF] — AB (ref 0–2)
WBC, UA: >30 /[HPF] — AB (ref 0–5)

## 2023-08-01 NOTE — Progress Notes (Signed)
 I, Maysun Anabel Bene, acting as a scribe for Riki Altes, MD., have documented all relevant documentation on the behalf of Riki Altes, MD, as directed by Riki Altes, MD while in the presence of Riki Altes, MD.  08/01/2023 10:57 AM   Francia Greaves [birth year] [id number]  Referring provider: Larae Grooms, NP [address] [place],  [place] [address]   HPI: Angela Russell is a 73 y.o. female referred for evaluation of bacteriuria.   Previously followed by Dr. Richardo Hanks for muscle-invasive bladder cancer. Initially presented with gross hematuria in February 2020, found to have a 4 cm posterior wall bladder tumor with muscle invasion. Underwent cystectomy with ileal conduit at [facility], pathology showed T3a disease and negative lymph nodes. Had recurrence in 2020 and has been followed in medical oncology. No UTIs, symptomatic UTIs, or pyelonephritis. Referred due to bacteriuria/pyuria on recent urinalysis.   PMH: Past Medical History:  Diagnosis Date   Anemia in stage 4 chronic kidney disease (HCC)    Bladder carcinoma (HCC)    Carotid artery plaque, right 01/2014   CKD (chronic kidney disease)    stage 4-followed by nephrology   Controlled diabetes mellitus type 2 with complications (HCC)    Diabetes mellitus without complication (HCC)    DM (diabetes mellitus), type 2, uncontrolled    Ductal carcinoma in situ (DCIS) of breast    DVT (deep venous thrombosis) (HCC)    Hyperlipidemia    Hypertension    Hypochromic microcytic anemia    mild   Metastatic urothelial carcinoma (HCC) 03/23/2019   Osteoporosis    Parastomal hernia    Personal history of chemotherapy    for bladder cancer   Personal history of colonic polyps    Personal history of radiation therapy    Port-A-Cath in place    right side   Renal insufficiency    Rotator cuff tendonitis, right    Sepsis (HCC)    UTI (urinary tract infection) 07/03/2022   Wears dentures    full upper and lower     Surgical History: Past Surgical History:  Procedure Laterality Date   BREAST BIOPSY Left 06/13/2022   stereo biopsy/x clip/ positive   BREAST BIOPSY Left 06/13/2022   stereo biopsy/ ribbon clip/ negative   BREAST BIOPSY Left 06/13/2022   MM LT BREAST BX W LOC DEV EA AD LESION IMG BX SPEC STEREO GUIDE 06/13/2022 [facility]   BREAST BIOPSY Left 06/13/2022   MM LT BREAST BX W LOC DEV 1ST LESION IMAGE BX SPEC STEREO GUIDE 06/13/2022 [facility]   BREAST LUMPECTOMY Left 07/17/2022   BREAST LUMPECTOMY WITH RADIOFREQUENCY TAG IDENTIFICATION Left 07/17/2022   Procedure: BREAST LUMPECTOMY WITH RADIOFREQUENCY TAG IDENTIFICATION;  Surgeon: Henrene Dodge, MD;  Location: [facility];  Service: General;  Laterality: Left;   CATARACT EXTRACTION W/PHACO Right 04/04/2023   Procedure: CATARACT EXTRACTION PHACO AND INTRAOCULAR LENS PLACEMENT (IOC) RIGHT DIABETIC 9.24 00:59.4;  Surgeon: Estanislado Pandy, MD;  Location: [facility];  Service: Ophthalmology;  Laterality: Right;   CATARACT EXTRACTION W/PHACO Left 04/25/2023   Procedure: CATARACT EXTRACTION PHACO AND INTRAOCULAR LENS PLACEMENT (IOC) LEFT DIABETIC 7.70 00:54.6;  Surgeon: Estanislado Pandy, MD;  Location: [facility];  Service: Ophthalmology;  Laterality: Left;   COLONOSCOPY WITH PROPOFOL N/A 03/17/2021   Procedure: COLONOSCOPY WITH PROPOFOL;  Surgeon: Wyline Mood, MD;  Location: [facility] ENDOSCOPY;  Service: Gastroenterology;  Laterality: N/A;   CYSTOSCOPY W/ RETROGRADES Bilateral 07/16/2018   Procedure: CYSTOSCOPY WITH RETROGRADE  PYELOGRAM;  Surgeon: Sondra Come, MD;  Location: [facility];  Service: Urology;  Laterality: Bilateral;   ESOPHAGOGASTRODUODENOSCOPY  03/17/2021   Procedure: ESOPHAGOGASTRODUODENOSCOPY (EGD);  Surgeon: Wyline Mood, MD;  Location: [facility];  Service: Gastroenterology;;   GIVENS CAPSULE STUDY N/A 06/19/2021   Procedure: GIVENS CAPSULE STUDY;  Surgeon: Wyline Mood, MD;  Location:  [facility] ENDOSCOPY;  Service: Gastroenterology;  Laterality: N/A;   IVC FILTER INSERTION N/A 03/30/2019   Procedure: IVC FILTER INSERTION;  Surgeon: Annice Needy, MD;  Location: [facility];  Service: Cardiovascular;  Laterality: N/A;   IVC FILTER REMOVAL N/A 06/15/2019   Procedure: IVC FILTER REMOVAL;  Surgeon: Annice Needy, MD;  Location: [facility];  Service: Cardiovascular;  Laterality: N/A;   KNEE SURGERY Left 03/17/2013   torn meniscus   PERIPHERAL VASCULAR THROMBECTOMY Right 03/30/2019   Procedure: PERIPHERAL VASCULAR THROMBECTOMY;  Surgeon: Annice Needy, MD;  Location: [facility];  Service: Cardiovascular;  Laterality: Right;   PORTA CATH INSERTION N/A 08/06/2018   Procedure: PORTA CATH INSERTION;  Surgeon: Annice Needy, MD;  Location: [facility];  Service: Cardiovascular;  Laterality: N/A;   TOTAL ABDOMINAL HYSTERECTOMY  2000   due to bleeding and fibroids, partial- still has ovaries   TRANSURETHRAL RESECTION OF BLADDER TUMOR N/A 07/16/2018   Procedure: TRANSURETHRAL RESECTION OF BLADDER TUMOR (TURBT);  Surgeon: Sondra Come, MD;  Location: [facility];  Service: Urology;  Laterality: N/A;    Home Medications:  Allergies as of 08/01/2023   No Known Allergies      Medication List        Accurate as of August 01, 2023 10:57 AM. If you have any questions, ask your nurse or doctor.          acetaminophen 500 MG tablet Commonly known as: TYLENOL Take 2 tablets (1,000 mg total) by mouth every 6 (six) hours as needed for mild pain.   amLODipine 10 MG tablet Commonly known as: NORVASC Take 1 tablet by mouth once daily   bisacodyl 5 MG EC tablet Commonly known as: DULCOLAX Take 1 tablet (5 mg total) by mouth daily as needed for moderate constipation.   docusate sodium 100 MG capsule Commonly known as: Colace Take 1 capsule (100 mg total) by mouth daily.   ferrous sulfate 324 MG Tbec Take 324 mg by mouth daily.   FISH OIL  PO Take 1 tablet by mouth daily at 6 (six) AM.   ketorolac 0.5 % ophthalmic solution Commonly known as: ACULAR 1 drop as directed.   olmesartan 40 MG tablet Commonly known as: BENICAR Take 1 tablet (40 mg total) by mouth daily.   polyethylene glycol 17 g packet Commonly known as: MIRALAX / GLYCOLAX Take 17 g by mouth 2 (two) times daily.   prednisoLONE acetate 1 % ophthalmic suspension Commonly known as: PRED FORTE 1 drop as directed.   Rybelsus 7 MG Tabs Generic drug: Semaglutide Take 1 tablet (7 mg total) by mouth daily.        Allergies: No Known Allergies  Family History: Family History  Problem Relation Age of Onset   Heart disease Mother    Heart attack Mother    Arthritis Father    Diabetes Brother    Breast cancer Neg Hx     Social History:  reports that she quit smoking about 32 years ago. Her smoking use included cigarettes. She started smoking about 42 years ago. She has a 10 pack-year  smoking history. She has never used smokeless tobacco. She reports that she does not drink alcohol and does not use drugs.   Physical Exam: BP (!) 153/74   Pulse (!) 108   Ht 5\' 3"  (1.6 m)   Wt 210 lb (95.3 kg)   BMI 37.20 kg/m   Constitutional:  Alert and oriented, No acute distress. HEENT: [place] AT Respiratory: Normal respiratory effort, no increased work of breathing. Psychiatric: Normal mood and affect.   Assessment & Plan:    1. Bacteriuria Discussed with the patient that with an ileal conduit, it is normal to have bacteria in the urine. Treatment is only recommended for symptomatic urinary tract infections (UTIs).  2.  Metastatic urothelial carcinoma Continue oncology follow-ups.   I have reviewed the above documentation for accuracy and completeness, and I agree with the above.   Riki Altes, MD  [facility] [address] [place], [place] [address] [phone]

## 2023-08-21 ENCOUNTER — Ambulatory Visit (INDEPENDENT_AMBULATORY_CARE_PROVIDER_SITE_OTHER): Payer: Medicare HMO | Admitting: Nurse Practitioner

## 2023-08-21 ENCOUNTER — Encounter: Payer: Self-pay | Admitting: Nurse Practitioner

## 2023-08-21 VITALS — BP 135/72 | HR 89 | Ht 63.0 in | Wt 214.2 lb

## 2023-08-21 DIAGNOSIS — R197 Diarrhea, unspecified: Secondary | ICD-10-CM | POA: Diagnosis not present

## 2023-08-21 DIAGNOSIS — I10 Essential (primary) hypertension: Secondary | ICD-10-CM

## 2023-08-21 NOTE — Assessment & Plan Note (Signed)
 Chronic.  Controlled.  Continue with current medication regimen of Amlodipine 10 mg daily and Olmesartan 40mg  daily.  Follow up in 3 months.  Continue to check blood pressures at home. Call sooner if concerns arise.

## 2023-08-21 NOTE — Progress Notes (Signed)
 BP 135/72 (BP Location: Right Arm, Patient Position: Sitting, Cuff Size: Large)   Pulse 89   Ht 5\' 3"  (1.6 m)   Wt 214 lb 3.2 oz (97.2 kg)   SpO2 99%   BMI 37.94 kg/m    Subjective:    Patient ID: Angela Russell, female    DOB: 1951-04-16, 73 y.o.   MRN: [id number]  HPI: Angela Russell is a 73 y.o. female  Chief Complaint  Patient presents with   Blood Pressure Check   possible irritable bowel syndrome    Started Monday, can't seem to hold it   HYPERTENSION with Chronic Kidney Disease Follow-up for BP check today.  Olmesartan was increased to 40 MG on 05/30/23.  Continues on Amlodipine.  She reports better BP readings at home.  Hypertension status: stable  Satisfied with current treatment? yes Duration of hypertension: chronic BP monitoring frequency:  daily BP range: 130/70 BP medication side effects:  no Medication compliance: good compliance Aspirin: no Recurrent headaches: no Visual changes: no Palpitations: no Dyspnea: no Chest pain: no Lower extremity edema: no Dizzy/lightheaded: no   Patient states on Monday she started having diarrhea.  She has been having 4-5x per day.  She isn't sure if it was something that she ate.  She is drinking fluids.  She denies any nausea.  No stomach pain.    Relevant past medical, surgical, family and social history reviewed and updated as indicated. Interim medical history since our last visit reviewed. Allergies and medications reviewed and updated.  Review of Systems  Constitutional:  Negative for activity change, appetite change, diaphoresis, fatigue and fever.  Respiratory:  Negative for cough, chest tightness and shortness of breath.   Cardiovascular:  Negative for chest pain, palpitations and leg swelling.  Gastrointestinal:  Positive for diarrhea. Negative for abdominal pain, constipation, nausea and vomiting.  Neurological: Negative.   Psychiatric/Behavioral: Negative.      Per HPI unless specifically indicated  above     Objective:    BP 135/72 (BP Location: Right Arm, Patient Position: Sitting, Cuff Size: Large)   Pulse 89   Ht 5\' 3"  (1.6 m)   Wt 214 lb 3.2 oz (97.2 kg)   SpO2 99%   BMI 37.94 kg/m   Wt Readings from Last 3 Encounters:  08/21/23 214 lb 3.2 oz (97.2 kg)  08/01/23 210 lb (95.3 kg)  06/18/23 210 lb 8 oz (95.5 kg)    Physical Exam Vitals and nursing note reviewed.  Constitutional:      General: She is awake. She is not in acute distress.    Appearance: She is well-developed and well-groomed. She is not ill-appearing or toxic-appearing.  HENT:     Head: Normocephalic.     Right Ear: Hearing and external ear normal.     Left Ear: Hearing and external ear normal.  Eyes:     General: Lids are normal.        Right eye: No discharge.        Left eye: No discharge.     Conjunctiva/sclera: Conjunctivae normal.     Pupils: Pupils are equal, round, and reactive to light.  Neck:     Thyroid: No thyromegaly.     Vascular: No carotid bruit.  Cardiovascular:     Rate and Rhythm: Normal rate and regular rhythm.     Heart sounds: Normal heart sounds. No murmur heard.    No gallop.  Pulmonary:     Effort: Pulmonary effort is normal. No  accessory muscle usage or respiratory distress.     Breath sounds: Normal breath sounds.  Abdominal:     General: Bowel sounds are normal. There is no distension.     Palpations: Abdomen is soft.     Tenderness: There is no abdominal tenderness.  Musculoskeletal:     Cervical back: Normal range of motion and neck supple.     Right lower leg: No edema.     Left lower leg: No edema.  Lymphadenopathy:     Cervical: No cervical adenopathy.  Skin:    General: Skin is warm and dry.  Neurological:     Mental Status: She is alert and oriented to person, place, and time.     Deep Tendon Reflexes: Reflexes are normal and symmetric.     Reflex Scores:      Brachioradialis reflexes are 2+ on the right side and 2+ on the left side.      Patellar  reflexes are 2+ on the right side and 2+ on the left side. Psychiatric:        Attention and Perception: Attention normal.        Mood and Affect: Mood normal.        Speech: Speech normal.        Behavior: Behavior normal. Behavior is cooperative.        Thought Content: Thought content normal.     Results for orders placed or performed in visit on 08/01/23  Microscopic Examination   Collection Time: 08/01/23  8:09 AM   Urine  Result Value Ref Range   WBC, UA >30 (A) 0 - 5 /hpf   RBC, Urine >30 (A) 0 - 2 /hpf   Epithelial Cells (non renal) 0-10 0 - 10 /hpf   Mucus, UA Present (A) Not Estab.   Bacteria, UA Many (A) None seen/Few  Urinalysis, Complete   Collection Time: 08/01/23  8:09 AM  Result Value Ref Range   Specific Gravity, UA 1.020 1.005 - 1.030   pH, UA 6.0 5.0 - 7.5   Color, UA Yellow Yellow   Appearance Ur Hazy (A) Clear   Leukocytes,UA 1+ (A) Negative   Protein,UA Trace (A) Negative/Trace   Glucose, UA Negative Negative   Ketones, UA Negative Negative   RBC, UA 2+ (A) Negative   Bilirubin, UA Negative Negative   Urobilinogen, Ur 0.2 0.2 - 1.0 mg/dL   Nitrite, UA Positive (A) Negative   Microscopic Examination See below:       Assessment & Plan:   Problem List Items Addressed This Visit       Cardiovascular and Mediastinum   Hypertension - Primary   Chronic.  Controlled.  Continue with current medication regimen of Amlodipine 10 mg daily and Olmesartan 40mg  daily.  Follow up in 3 months.  Continue to check blood pressures at home. Call sooner if concerns arise.       Other Visit Diagnoses       Diarrhea, unspecified type       Suspect it is related to viral illness.  Recommend a bland diet and advance as tolerated.        Follow up plan: Return in about 3 months (around 11/21/2023) for HTN, HLD, DM2 FU.

## 2023-08-23 ENCOUNTER — Ambulatory Visit: Payer: Self-pay | Admitting: Nurse Practitioner

## 2023-08-31 DIAGNOSIS — C679 Malignant neoplasm of bladder, unspecified: Secondary | ICD-10-CM | POA: Diagnosis not present

## 2023-08-31 DIAGNOSIS — Z936 Other artificial openings of urinary tract status: Secondary | ICD-10-CM | POA: Diagnosis not present

## 2023-09-04 DIAGNOSIS — Z936 Other artificial openings of urinary tract status: Secondary | ICD-10-CM | POA: Diagnosis not present

## 2023-09-04 DIAGNOSIS — C679 Malignant neoplasm of bladder, unspecified: Secondary | ICD-10-CM | POA: Diagnosis not present

## 2023-09-08 ENCOUNTER — Other Ambulatory Visit: Payer: Self-pay | Admitting: Nurse Practitioner

## 2023-09-10 NOTE — Telephone Encounter (Signed)
 Requested Prescriptions  Pending Prescriptions Disp Refills   amLODipine (NORVASC) 10 MG tablet [Pharmacy Med Name: amLODIPine Besylate 10 MG Oral Tablet] 30 tablet 0    Sig: Take 1 tablet by mouth once daily     Cardiovascular: Calcium Channel Blockers 2 Passed - 09/10/2023  9:57 AM      Passed - Last BP in normal range    BP Readings from Last 1 Encounters:  08/21/23 135/72         Passed - Last Heart Rate in normal range    Pulse Readings from Last 1 Encounters:  08/21/23 89         Passed - Valid encounter within last 6 months    Recent Outpatient Visits           2 weeks ago Primary hypertension   [facility] Hansen, Clydie Braun, NP               RYBELSUS 3 MG TABS [Pharmacy Med Name: Rybelsus 3 MG Oral Tablet] 90 tablet 0    Sig: Take 1 tablet by mouth once daily     Off-Protocol Failed - 09/10/2023  9:57 AM      Failed - Medication not assigned to a protocol, review manually.      Passed - Valid encounter within last 12 months    Recent Outpatient Visits           2 weeks ago Primary hypertension   [facility] Larae Grooms, NP

## 2023-09-16 ENCOUNTER — Other Ambulatory Visit: Payer: Self-pay

## 2023-09-16 DIAGNOSIS — C679 Malignant neoplasm of bladder, unspecified: Secondary | ICD-10-CM

## 2023-09-17 ENCOUNTER — Encounter: Payer: Self-pay | Admitting: Oncology

## 2023-09-17 ENCOUNTER — Other Ambulatory Visit: Payer: Medicare HMO

## 2023-09-17 ENCOUNTER — Ambulatory Visit: Payer: Medicare HMO | Admitting: Oncology

## 2023-09-17 ENCOUNTER — Inpatient Hospital Stay: Payer: Medicare HMO | Attending: Oncology

## 2023-09-17 ENCOUNTER — Inpatient Hospital Stay (HOSPITAL_BASED_OUTPATIENT_CLINIC_OR_DEPARTMENT_OTHER): Payer: Medicare HMO | Admitting: Oncology

## 2023-09-17 VITALS — BP 151/69 | HR 99 | Temp 96.9°F | Resp 18 | Wt 210.0 lb

## 2023-09-17 DIAGNOSIS — I129 Hypertensive chronic kidney disease with stage 1 through stage 4 chronic kidney disease, or unspecified chronic kidney disease: Secondary | ICD-10-CM | POA: Insufficient documentation

## 2023-09-17 DIAGNOSIS — R200 Anesthesia of skin: Secondary | ICD-10-CM | POA: Diagnosis not present

## 2023-09-17 DIAGNOSIS — Z8249 Family history of ischemic heart disease and other diseases of the circulatory system: Secondary | ICD-10-CM | POA: Diagnosis not present

## 2023-09-17 DIAGNOSIS — Z95828 Presence of other vascular implants and grafts: Secondary | ICD-10-CM

## 2023-09-17 DIAGNOSIS — Z79899 Other long term (current) drug therapy: Secondary | ICD-10-CM | POA: Insufficient documentation

## 2023-09-17 DIAGNOSIS — M79604 Pain in right leg: Secondary | ICD-10-CM

## 2023-09-17 DIAGNOSIS — M79606 Pain in leg, unspecified: Secondary | ICD-10-CM | POA: Diagnosis not present

## 2023-09-17 DIAGNOSIS — E785 Hyperlipidemia, unspecified: Secondary | ICD-10-CM | POA: Diagnosis not present

## 2023-09-17 DIAGNOSIS — K5900 Constipation, unspecified: Secondary | ICD-10-CM | POA: Diagnosis not present

## 2023-09-17 DIAGNOSIS — C679 Malignant neoplasm of bladder, unspecified: Secondary | ICD-10-CM

## 2023-09-17 DIAGNOSIS — E1122 Type 2 diabetes mellitus with diabetic chronic kidney disease: Secondary | ICD-10-CM | POA: Insufficient documentation

## 2023-09-17 DIAGNOSIS — Z87440 Personal history of urinary (tract) infections: Secondary | ICD-10-CM | POA: Diagnosis not present

## 2023-09-17 DIAGNOSIS — M2550 Pain in unspecified joint: Secondary | ICD-10-CM | POA: Insufficient documentation

## 2023-09-17 DIAGNOSIS — N1832 Chronic kidney disease, stage 3b: Secondary | ICD-10-CM

## 2023-09-17 DIAGNOSIS — N184 Chronic kidney disease, stage 4 (severe): Secondary | ICD-10-CM

## 2023-09-17 DIAGNOSIS — D0512 Intraductal carcinoma in situ of left breast: Secondary | ICD-10-CM | POA: Diagnosis not present

## 2023-09-17 DIAGNOSIS — Z9221 Personal history of antineoplastic chemotherapy: Secondary | ICD-10-CM | POA: Insufficient documentation

## 2023-09-17 DIAGNOSIS — D631 Anemia in chronic kidney disease: Secondary | ICD-10-CM

## 2023-09-17 DIAGNOSIS — Z87891 Personal history of nicotine dependence: Secondary | ICD-10-CM | POA: Diagnosis not present

## 2023-09-17 DIAGNOSIS — Z90710 Acquired absence of both cervix and uterus: Secondary | ICD-10-CM | POA: Diagnosis not present

## 2023-09-17 DIAGNOSIS — Z860100 Personal history of colon polyps, unspecified: Secondary | ICD-10-CM | POA: Insufficient documentation

## 2023-09-17 DIAGNOSIS — M79605 Pain in left leg: Secondary | ICD-10-CM | POA: Diagnosis not present

## 2023-09-17 DIAGNOSIS — Z86718 Personal history of other venous thrombosis and embolism: Secondary | ICD-10-CM | POA: Diagnosis not present

## 2023-09-17 DIAGNOSIS — Z8551 Personal history of malignant neoplasm of bladder: Secondary | ICD-10-CM | POA: Diagnosis not present

## 2023-09-17 DIAGNOSIS — Z833 Family history of diabetes mellitus: Secondary | ICD-10-CM | POA: Insufficient documentation

## 2023-09-17 DIAGNOSIS — Z8261 Family history of arthritis: Secondary | ICD-10-CM | POA: Diagnosis not present

## 2023-09-17 LAB — CBC WITH DIFFERENTIAL (CANCER CENTER ONLY)
Abs Immature Granulocytes: 0.03 10*3/uL (ref 0.00–0.07)
Basophils Absolute: 0 10*3/uL (ref 0.0–0.1)
Basophils Relative: 0 %
Eosinophils Absolute: 0.1 10*3/uL (ref 0.0–0.5)
Eosinophils Relative: 2 %
HCT: 30.6 % — ABNORMAL LOW (ref 36.0–46.0)
Hemoglobin: 9.4 g/dL — ABNORMAL LOW (ref 12.0–15.0)
Immature Granulocytes: 1 %
Lymphocytes Relative: 27 %
Lymphs Abs: 1.5 10*3/uL (ref 0.7–4.0)
MCH: 24.6 pg — ABNORMAL LOW (ref 26.0–34.0)
MCHC: 30.7 g/dL (ref 30.0–36.0)
MCV: 80.1 fL (ref 80.0–100.0)
Monocytes Absolute: 0.4 10*3/uL (ref 0.1–1.0)
Monocytes Relative: 7 %
Neutro Abs: 3.4 10*3/uL (ref 1.7–7.7)
Neutrophils Relative %: 63 %
Platelet Count: 260 10*3/uL (ref 150–400)
RBC: 3.82 MIL/uL — ABNORMAL LOW (ref 3.87–5.11)
RDW: 14.3 % (ref 11.5–15.5)
WBC Count: 5.3 10*3/uL (ref 4.0–10.5)
nRBC: 0 % (ref 0.0–0.2)

## 2023-09-17 LAB — IRON AND TIBC
Iron: 65 ug/dL (ref 28–170)
Saturation Ratios: 19 % (ref 10.4–31.8)
TIBC: 339 ug/dL (ref 250–450)
UIBC: 274 ug/dL

## 2023-09-17 LAB — CMP (CANCER CENTER ONLY)
ALT: 14 U/L (ref 0–44)
AST: 22 U/L (ref 15–41)
Albumin: 3.8 g/dL (ref 3.5–5.0)
Alkaline Phosphatase: 82 U/L (ref 38–126)
Anion gap: 10 (ref 5–15)
BUN: 27 mg/dL — ABNORMAL HIGH (ref 8–23)
CO2: 22 mmol/L (ref 22–32)
Calcium: 8.6 mg/dL — ABNORMAL LOW (ref 8.9–10.3)
Chloride: 106 mmol/L (ref 98–111)
Creatinine: 1.62 mg/dL — ABNORMAL HIGH (ref 0.44–1.00)
GFR, Estimated: 33 mL/min — ABNORMAL LOW (ref >60)
Glucose, Bld: 118 mg/dL — ABNORMAL HIGH (ref 70–99)
Potassium: 3.6 mmol/L (ref 3.5–5.1)
Sodium: 138 mmol/L (ref 135–145)
Total Bilirubin: 0.5 mg/dL (ref 0.0–1.2)
Total Protein: 7.4 g/dL (ref 6.5–8.1)

## 2023-09-17 LAB — LACTATE DEHYDROGENASE: LDH: 139 U/L (ref 98–192)

## 2023-09-17 LAB — FERRITIN: Ferritin: 167 ng/mL (ref 11–307)

## 2023-09-17 MED ORDER — SODIUM CHLORIDE 0.9% FLUSH
10.0000 mL | Freq: Once | INTRAVENOUS | Status: AC
  Administered 2023-09-17: 10 mL via INTRAVENOUS
  Filled 2023-09-17: qty 10

## 2023-09-17 MED ORDER — HEPARIN SOD (PORK) LOCK FLUSH 100 UNIT/ML IV SOLN
500.0000 [IU] | Freq: Once | INTRAVENOUS | Status: AC
  Administered 2023-09-17: 500 [IU] via INTRAVENOUS
  Filled 2023-09-17: qty 5

## 2023-09-17 NOTE — Progress Notes (Signed)
 Pt here for follow up. Report she has been having pain to both legs for approx 2 week.Pain is from knees to feet and it hurts to walk

## 2023-09-17 NOTE — Assessment & Plan Note (Addendum)
 Lab Results  Component Value Date   HGB 9.4 (L) 09/17/2023   TIBC 339 09/17/2023   IRONPCTSAT 19 09/17/2023   FERRITIN 167 09/17/2023    She declines IV Venofer due to fatigue  side effects. Hb is stable.  Recommend patient to continue iron supplementation

## 2023-09-17 NOTE — Assessment & Plan Note (Signed)
 I will obtain bilateral lower extremity venous ultrasound.

## 2023-09-17 NOTE — Assessment & Plan Note (Signed)
 DCIS, ER negative. S/p lumpectomy.  S/p adjuvant radiation.  Recommend annual mammogram surveillance. - Nov 2024 - negative.

## 2023-09-17 NOTE — Assessment & Plan Note (Addendum)
#  Metastatic urothelial carcinoma of bladder, sarcomatoid features pelvic sidewall mass biopsy showed metastatic carcinoma consistent with involvement by urothelial carcinoma., Stage IV NED- since 01/2020-patient was on Keytruda every 3 to 4 weeks for 2.5 years.  Shared decision was made to stop treatment and continued surveillance. Labs reviewed and discussed with patient Dec 2024  CT abdomen and pelvis without contrast- NED

## 2023-09-17 NOTE — Assessment & Plan Note (Addendum)
Avoid nephrotoxin.,  Encourage hydration.

## 2023-09-17 NOTE — Assessment & Plan Note (Signed)
 Continue Port flush every 6-8 weeks  Discussed about option of medi port removal. She would like to keep the medi port until 5 years after NED on images. - March 2026

## 2023-09-17 NOTE — Progress Notes (Signed)
 Hematology/Oncology follow up note [phone] [phone]   Patient Care Team: Larae Grooms, NP as PCP - General Nice, Sheppard Plumber, OD (Optometry) Rickard Patience, MD as Consulting Physician (Hematology and Oncology) Lajean Manes, [facility] (Inactive) (Pharmacist) Hulen Luster, RN as Oncology Nurse Navigator Voora, Sharon Mt, MD (Nephrology)  CHIEF COMPLAINTS/REASON FOR VISIT:  Follow up for bladder cancer, anemia due to CKD, ER negative DCIS  ASSESSMENT & PLAN:   Bladder carcinoma [facility]) #Metastatic urothelial carcinoma of bladder, sarcomatoid features pelvic sidewall mass biopsy showed metastatic carcinoma consistent with involvement by urothelial carcinoma., Stage IV NED- since 01/2020-patient was on Keytruda every 3 to 4 weeks for 2.5 years.  Shared decision was made to stop treatment and continued surveillance. Labs reviewed and discussed with patient Dec 2024  CT abdomen and pelvis without contrast- NED   Ductal carcinoma in situ (DCIS) of left breast DCIS, ER negative. S/p lumpectomy.  S/p adjuvant radiation.  Recommend annual mammogram surveillance. - Nov 2024 - negative.   Anemia in stage 4 chronic kidney disease (HCC) Lab Results  Component Value Date   HGB 9.4 (L) 09/17/2023   TIBC 339 09/17/2023   IRONPCTSAT 19 09/17/2023   FERRITIN 167 09/17/2023    She declines IV Venofer due to fatigue  side effects. Hb is stable.  Recommend patient to continue iron supplementation    CKD (chronic kidney disease) Avoid nephrotoxin.,  Encourage hydration.   Port-A-Cath in place Continue Port flush every 6-8 weeks  Discussed about option of medi port removal. She would like to keep the medi port until 5 years after NED on images. - March 2026  Tenderness of lower extremity I will obtain bilateral lower extremity venous ultrasound.     Orders Placed This Encounter  Procedures   US Venous Img Lower Bilateral (DVT)    Standing Status:   Future     Expiration Date:   09/16/2024    Reason for exam::   Leg Pain    Preferred imaging location?:   [facility]   CT CHEST ABDOMEN PELVIS WO CONTRAST    Standing Status:   Future    Expected Date:   12/17/2023    Expiration Date:   09/16/2024    Preferred imaging location?:   [facility]    If indicated for the ordered procedure, I authorize the administration of oral contrast media per Radiology protocol:   Yes    Does the patient have a contrast media/X-ray dye allergy?:   No   CBC with Differential (Cancer Center Only)    Standing Status:   Future    Expected Date:   12/17/2023    Expiration Date:   09/16/2024   CMP (Cancer Center only)    Standing Status:   Future    Expected Date:   12/17/2023    Expiration Date:   09/16/2024   Retic Panel    Standing Status:   Future    Expected Date:   12/17/2023    Expiration Date:   09/16/2024   Iron and TIBC ([facility] ONLY)    Standing Status:   Future    Expected Date:   12/17/2023    Expiration Date:   09/16/2024   Ferritin    Standing Status:   Future    Expected Date:   12/17/2023    Expiration Date:   09/16/2024   Follow-up 3 months All questions were answered. The patient knows to call the clinic with any problems, questions or concerns.  Timmy Forbes, MD, PhD [facility] Hematology Oncology 09/17/2023      HISTORY OF PRESENTING ILLNESS:  Oncology History  Bladder carcinoma (HCC)  07/01/2018 Imaging   CT renal stone study showed suspected irregular wall thickening about the superior bladder, not well assessed due to degree of bladder distention. Recommend cystoscopy for further evaluation    07/03/2018 Imaging   another CT abdomen pelvis with contrast was done which showed no nephrolithiasis or hydronephrosis is identified. Bladder is decompressed limiting evaluation.    07/16/2018 Initial Diagnosis   urology Dr. Estanislao Heimlich - cystoscopy and bilateral retrograde pyelogram on 07/16/2018.  Pyelogram did not show any  filling defect or abnormalities.  No hydronephrosis.  Ureteral orifice was not involved with tumor.  There is a large 5 cm posterior wall bladder tumor, bullous and sessile appearing.  Patient underwent TURBT.    Pathology: High-grade urothelial carcinoma, invasive into muscularis propria.  Lymphovascular invasion is present.  Carcinoma in situ is also identified.  Focal squamous differentiation is noted, areas of invasive carcinoma display pleomorphic/sarcomatoid changes.  T2b     08/04/2018 -  Chemotherapy   Neoadjuvant DD MVAC x 1 cycle, stopped due to intolerance and acute kidney failure.  Patient was sent to [facility] urology for evaluation of cystectomy   08/07/2018 Imaging   CT without contrast negative. 2 subpleural right upper lobe nodule 2 to 3 mm likely benign.    09/22/2018 Surgery    patient underwent a cystectomy, pathology pT3a N0 Mx.  11 lymph nodes were harvested and was all negative. Invasive urothelia carcinoma, high grade, with sarcomatoid features.    02/09/2019 -  Hospital Admission   Patient has had difficulties following up with [facility].  Presented emergency room on 02/09/2023 abdominal pain.  CT concerning for small bowel obstruction and increased size of right pelvic fluid collection concerning for cancer recurrence.  Right hydronephrosis to the level of pelvis and enlarged lymph nodes. Patient had JP drain placed with CT guidance to pelvic fluid collection and drained 400 cc amber fluid.  Culture was negative for growth of microorganisms and cytology was negative for malignancy.-JP drain was removed on the day of discharge. CT-guided core biopsy of pelvic lymph node adenopathy was attempted but not successful.  She also developed right lower extremity DVT provoked by transvenous biopsy.- Post biopsy acute thrombus in the right external iliac and common femoral vein.  Patient was recommended to start anticoagulation with Xarelto however due to the co-pay, patient is not able to afford  the medication.   02/25/2019 Procedure   IR Transcaval pelvic mass biopsy at [facility] showed metastatic carcinoma, consistent with involvement by urothelial carcinoma. PD-L1-CPS 100%   03/16/2019 Imaging   03/16/2019 patient was found to have increased lower extremity swelling, it was found that she was actually taking Eliquis 2.5 mg twice daily.  Repeat right lower extremity ultrasound showed persistent extensive proximal right lower extremity DVT.  Anticoagulation regimen was changed to Eliquis 5 mg twice daily.   03/20/2019 Imaging   PET showed FDG avid tissue in the cystectomy bed, retroperitoneal and pelvic lymphadenopathy. Right common iliac DVT.    03/30/2019 Procedure   Status post IVC filter placement, mechanical thrombectomy.-IVC filter was retrieved in January 2021.    03/31/2019 -  Chemotherapy   Keytruda every 3 weeks.   04/26/2020 Imaging   CT chest abdomen pelvis was reviewed and discussed with patient. No definitive finding of disease recurrence or metastasis.  Small amount of ascites similar to prior.There is a  parastomal hernia containing a small margin of adjacent small bowel.  This is associated with wall thickening in approximately 7 cm segment of bowel adjacent to the hernia.  There is adjacent abnormal stranding of the mesentery and omentum.   08/19/2020 Imaging   CT chest abdomen pelvis showed no evidence of new/progressive metastatic disease. NED   # Chronic abdominal discomfort due to hernia. 08/11/2020, patient was seen by Dr. Annia Kilts for parastomal hernia.  Patient has no obstructive symptoms and would like to continue to monitor her symptoms and hold off any intervention at this point.  Patient was recommended to wear ostomy hernia belt and was referred to [facility] wound ostomy nursing team.   04/10/2021 Imaging   CT chest abdomen pelvis without contrast showed no evidence of disease recurrence. Small volume fluid in the pelvis, decreased. Emphysema/diffuse bilateral  bronchial wall thickening. CAD.    10/02/2021 - 10/05/2021 Hospital Admission   atient was admitted due to sepsis secondary to UTI. Patient also had CT chest angiogram done during the hospitalization which showed no PE or pneumonia.. Lower extremity ultrasound showed negative for DVT. Patient was provided supportive care IV fluid hydration, broad-spectrum antibiotics. COVID and influenza negative. Blood culture positive for E. coli. Patient was transitioned to oral antibiotics Levaquin and completed 14 days of course.    05/10/2022 Imaging   CT chest abdomen pelvis with contrast showed 1. No findings of active malignancy in the chest, abdomen, or pelvis. Stable perivascular density along the pelvic sidewalls likely from previous treated malignancy. 2. Prior cystectomy and ileal conduit. The ileal conduit is no longer as prominent in size as it was on 11/03/2021. There is some borderline hydroureter of the right mid ureter but no hydronephrosis. 3. Peristomal hernia along the urostomy site contains a loop of distal small bowel. No findings of strangulation or obstruction. 4. Aberrant right subclavian artery passes behind the esophagus.5. Degenerative arthropathy of both hips. Grade 1 degenerative anterolisthesis of L5 on S1. 6. Aortic atherosclerosis.   Metastatic urothelial carcinoma (HCC)  03/23/2019 Initial Diagnosis   Metastatic urothelial carcinoma (HCC)   03/31/2019 - 09/05/2021 Chemotherapy   Patient is on Treatment Plan : Bladder - Pembrolizumab Q28D     Ductal carcinoma in situ (DCIS) of left breast  04/23/2022 Mammogram   Bilateral screening mammogram showed left breast calcifications warrant further evaluation.  Right breast no findings suspicious for malignancy.   06/01/2022 Mammogram   Unilateral diagnostic mammogram showed 2 groups of indeterminate calcifications in the left breast, 1 in the upper outer left breast spanning 1.3 cm and another in the central inferior left breast  spanning 1.2 cm.   06/13/2022 Cancer Staging   Staging form: Breast, AJCC 8th Edition - Clinical stage from 06/13/2022: Stage 0 (cTis (DCIS), cN0, cM0, G3, ER: Unknown, PR: Not Assessed, HER2: Not Assessed) - Signed by Timmy Forbes, MD on 06/26/2022 Stage prefix: Initial diagnosis Nuclear grade: G3 Histologic grading system: 3 grade system   06/13/2022 Initial Diagnosis   Ductal carcinoma in situ (DCIS) of left breast  06/13/2022, patient underwent left breast medial and lateral biopsy. Medial biopsy showed DCIS, grade 3, focal comedo type necrosis present.  Calcifications identified. Left lateral biopsy showed breast parenchyma with focal fibrosis.  No significant atypia, calcifications identified.  Menarche 47 to 73 years of age History of hysterectomy in 2000. Previous use of OCP many years ago.  She is not able to recall how many years. Denies any hormone replacement therapy.  Denies previous chest radiation. Denies  any family history of breast cancer.   07/17/2022 Surgery   S/p left breast lumpectomy  Pathology showed DCIS, Grade 3, all margins negative for DCIS ER negative [<1%]   09/21/2022 - 10/02/2022 Radiation Therapy   S/p adjuvant breast radiation.       INTERVAL HISTORY Angela Russell is a 73 y.o. female who has above history reviewed by me today presents for evaluation of newly diagnosed left breast DCIS. Patient is known to me for history of metastatic high-grade urothelial carcinoma of the bladder, sarcomatoid feature, currently in remission.  She reports doing well currently.  She takes oral iron supplementation. Constipation, she takes otc stool softner.   She has been experiencing soreness in both knees and tenderness in the ankles, which has made walking difficult. The pain is described as aching, particularly at night, and has been present for about two weeks. It radiates from the knee down, a new symptom for her, as it usually remains localized to the knee  area.  She has a history of arthritis in her knees and has been taking Tylenol for the pain, but finds it provides limited relief. She is concerned about the possibility of blood clots, given her history of DVT. Her past medical history includes a deep vein thrombosis (DVT) following a biopsy at [facility] in 2020, for which a filter was placed and later removed in 2021.   Review of Systems  Constitutional:  Negative for appetite change, chills, fatigue and fever.  HENT:   Negative for hearing loss and voice change.   Eyes:  Negative for eye problems.  Respiratory:  Negative for chest tightness and cough.   Cardiovascular:  Negative for chest pain and leg swelling.  Gastrointestinal:  Negative for abdominal distention, abdominal pain, blood in stool and constipation.  Endocrine: Negative for hot flashes.  Genitourinary:  Negative for difficulty urinating and frequency.   Musculoskeletal:  Positive for arthralgias.       Bilateral lower extremity tenderness  Skin:  Negative for itching and rash.  Neurological:  Positive for numbness. Negative for extremity weakness.  Hematological:  Negative for adenopathy.  Psychiatric/Behavioral:  Negative for confusion. The patient is not nervous/anxious.     MEDICAL HISTORY:  Past Medical History:  Diagnosis Date   Anemia in stage 4 chronic kidney disease (HCC)    Bladder carcinoma (HCC)    Carotid artery plaque, right 01/2014   CKD (chronic kidney disease)    stage 4-followed by nephrology   Controlled diabetes mellitus type 2 with complications (HCC)    Diabetes mellitus without complication (HCC)    DM (diabetes mellitus), type 2, uncontrolled    Ductal carcinoma in situ (DCIS) of breast    DVT (deep venous thrombosis) (HCC)    Hyperlipidemia    Hypertension    Hypochromic microcytic anemia    mild   Metastatic urothelial carcinoma (HCC) 03/23/2019   Osteoporosis    Parastomal hernia    Personal history of chemotherapy    for  bladder cancer   Personal history of colonic polyps    Personal history of radiation therapy    Port-A-Cath in place    right side   Renal insufficiency    Rotator cuff tendonitis, right    Sepsis (HCC)    UTI (urinary tract infection) 07/03/2022   Wears dentures    full upper and lower    SURGICAL HISTORY: Past Surgical History:  Procedure Laterality Date   BREAST BIOPSY Left 06/13/2022   stereo biopsy/x  clip/ positive   BREAST BIOPSY Left 06/13/2022   stereo biopsy/ ribbon clip/ negative   BREAST BIOPSY Left 06/13/2022   MM LT BREAST BX W LOC DEV EA AD LESION IMG BX SPEC STEREO GUIDE 06/13/2022 [facility]   BREAST BIOPSY Left 06/13/2022   MM LT BREAST BX W LOC DEV 1ST LESION IMAGE BX SPEC STEREO GUIDE 06/13/2022 [facility]   BREAST LUMPECTOMY Left 07/17/2022   BREAST LUMPECTOMY WITH RADIOFREQUENCY TAG IDENTIFICATION Left 07/17/2022   Procedure: BREAST LUMPECTOMY WITH RADIOFREQUENCY TAG IDENTIFICATION;  Surgeon: Emmalene Hare, MD;  Location: [facility];  Service: General;  Laterality: Left;   CATARACT EXTRACTION W/PHACO Right 04/04/2023   Procedure: CATARACT EXTRACTION PHACO AND INTRAOCULAR LENS PLACEMENT (IOC) RIGHT DIABETIC 9.24 00:59.4;  Surgeon: Trudi Fus, MD;  Location: [facility];  Service: Ophthalmology;  Laterality: Right;   CATARACT EXTRACTION W/PHACO Left 04/25/2023   Procedure: CATARACT EXTRACTION PHACO AND INTRAOCULAR LENS PLACEMENT (IOC) LEFT DIABETIC 7.70 00:54.6;  Surgeon: Trudi Fus, MD;  Location: [facility];  Service: Ophthalmology;  Laterality: Left;   COLONOSCOPY WITH PROPOFOL N/A 03/17/2021   Procedure: COLONOSCOPY WITH PROPOFOL;  Surgeon: Luke Salaam, MD;  Location: [facility] ENDOSCOPY;  Service: Gastroenterology;  Laterality: N/A;   CYSTOSCOPY W/ RETROGRADES Bilateral 07/16/2018   Procedure: CYSTOSCOPY WITH RETROGRADE PYELOGRAM;  Surgeon: Lawerence Pressman, MD;  Location: [facility];  Service: Urology;  Laterality:  Bilateral;   ESOPHAGOGASTRODUODENOSCOPY  03/17/2021   Procedure: ESOPHAGOGASTRODUODENOSCOPY (EGD);  Surgeon: Luke Salaam, MD;  Location: [facility] END[facility]COPY;  Service: Gastroenterology;;   GIVENS CAPSULE STUDY N/A 06/19/2021   Procedure: GIVENS CAPSULE STUDY;  Surgeon: Luke Salaam, MD;  Location: [facility] ENDOSCOPY;  Service: Gastroenterology;  Laterality: N/A;   IVC FILTER INSERTION N/A 03/30/2019   Procedure: IVC FILTER INSERTION;  Surgeon: Celso College, MD;  Location: [facility];  Service: Cardiovascular;  Laterality: N/A;   IVC FILTER REMOVAL N/A 06/15/2019   Procedure: IVC FILTER REMOVAL;  Surgeon: Celso College, MD;  Location: [facility];  Service: Cardiovascular;  Laterality: N/A;   KNEE SURGERY Left 03/17/2013   torn meniscus   PERIPHERAL VASCULAR THROMBECTOMY Right 03/30/2019   Procedure: PERIPHERAL VASCULAR THROMBECTOMY;  Surgeon: Celso College, MD;  Location: [facility];  Service: Cardiovascular;  Laterality: Right;   PORTA CATH INSERTION N/A 08/06/2018   Procedure: PORTA CATH INSERTION;  Surgeon: Celso College, MD;  Location: [facility];  Service: Cardiovascular;  Laterality: N/A;   TOTAL ABDOMINAL HYSTERECTOMY  2000   due to bleeding and fibroids, partial- still has ovaries   TRANSURETHRAL RESECTION OF BLADDER TUMOR N/A 07/16/2018   Procedure: TRANSURETHRAL RESECTION OF BLADDER TUMOR (TURBT);  Surgeon: Lawerence Pressman, MD;  Location: [facility];  Service: Urology;  Laterality: N/A;    SOCIAL HISTORY: Social History   Socioeconomic History   Marital status: Divorced    Spouse name: Not on file   Number of children: 1   Years of education: Not on file   Highest education level: 10th grade  Occupational History   Not on file  Tobacco Use   Smoking status: Former    Current packs/day: 0.00    Average packs/day: 1 pack/day for 10.0 years (10.0 ttl pk-yrs)    Types: Cigarettes    Start date: 06/04/1981    Quit date: 06/05/1991    Years since  quitting: 32.3   Smokeless tobacco: Never  Vaping Use   Vaping status: Never Used  Substance and Sexual  Activity   Alcohol use: No   Drug use: No   Sexual activity: Not Currently  Other Topics Concern   Not on file  Social History Narrative   Working full time   Social Drivers of Health   Financial Resource Strain: Low Risk  (04/09/2023)   Overall Financial Resource Strain (CARDIA)    Difficulty of Paying Living Expenses: Not hard at all  Food Insecurity: No Food Insecurity (04/09/2023)   Hunger Vital Sign    Worried About Running Out of Food in the Last Year: Never true    Ran Out of Food in the Last Year: Never true  Transportation Needs: No Transportation Needs (04/09/2023)   PRAPARE - Administrator, Civil Service (Medical): No    Lack of Transportation (Non-Medical): No  Physical Activity: Insufficiently Active (04/09/2023)   Exercise Vital Sign    Days of Exercise per Week: 3 days    Minutes of Exercise per Session: 30 min  Stress: No Stress Concern Present (04/09/2023)   [employer] of Occupational Health - Occupational Stress Questionnaire    Feeling of Stress : Not at all  Social Connections: Moderately Isolated (04/09/2023)   Social Connection and Isolation Panel [NHANES]    Frequency of Communication with Friends and Family: More than three times a week    Frequency of Social Gatherings with Friends and Family: More than three times a week    Attends Religious Services: More than 4 times per year    Active Member of [employer] or Organizations: No    Attends Banker Meetings: Never    Marital Status: Divorced  Catering manager Violence: Not At Risk (04/09/2023)   Humiliation, Afraid, Rape, and Kick questionnaire    Fear of Current or Ex-Partner: No    Emotionally Abused: No    Physically Abused: No    Sexually Abused: No    FAMILY HISTORY: Family History  Problem Relation Age of Onset   Heart disease Mother    Heart attack Mother     Arthritis Father    Diabetes Brother    Breast cancer Neg Hx     ALLERGIES:  has no known allergies.  MEDICATIONS:  Current Outpatient Medications  Medication Sig Dispense Refill   amLODipine (NORVASC) 10 MG tablet Take 1 tablet by mouth once daily 90 tablet 0   olmesartan (BENICAR) 40 MG tablet Take 1 tablet (40 mg total) by mouth daily. 90 tablet 1   Omega-3 Fatty Acids (FISH OIL PO) Take 1 tablet by mouth daily at 6 (six) AM.     Semaglutide (RYBELSUS) 7 MG TABS Take 1 tablet (7 mg total) by mouth daily. 90 tablet 1   acetaminophen (TYLENOL) 500 MG tablet Take 2 tablets (1,000 mg total) by mouth every 6 (six) hours as needed for mild pain. (Patient not taking: Reported on 09/17/2023)     bisacodyl (DULCOLAX) 5 MG EC tablet Take 1 tablet (5 mg total) by mouth daily as needed for moderate constipation. (Patient not taking: Reported on 09/17/2023) 30 tablet 0   docusate sodium (COLACE) 100 MG capsule Take 1 capsule (100 mg total) by mouth daily. (Patient not taking: Reported on 09/17/2023) 90 capsule 0   ferrous sulfate 324 MG TBEC Take 324 mg by mouth daily. (Patient not taking: Reported on 09/17/2023)     ketorolac (ACULAR) 0.5 % ophthalmic solution 1 drop as directed. (Patient not taking: Reported on 09/17/2023)     polyethylene glycol (MIRALAX / GLYCOLAX) 17 g  packet Take 17 g by mouth 2 (two) times daily. (Patient not taking: Reported on 09/17/2023) 14 each 0   prednisoLONE acetate (PRED FORTE) 1 % ophthalmic suspension 1 drop as directed. (Patient not taking: Reported on 09/17/2023)     No current facility-administered medications for this visit.   Facility-Administered Medications Ordered in Other Visits  Medication Dose Route Frequency Provider Last Rate Last Admin   heparin lock flush 100 UNIT/ML injection              PHYSICAL EXAMINATION: ECOG PERFORMANCE STATUS: 1 - Symptomatic but completely ambulatory Vitals:   09/17/23 1438  BP: (!) 151/69  Pulse: 99  Resp: 18  Temp: (!)  96.9 F (36.1 C)    Filed Weights   09/17/23 1438  Weight: 210 lb (95.3 kg)     Physical Exam Constitutional:      General: She is not in acute distress.    Appearance: She is obese.     Comments: Walk independently  HENT:     Head: Normocephalic and atraumatic.  Eyes:     General: No scleral icterus.    Pupils: Pupils are equal, round, and reactive to light.  Cardiovascular:     Rate and Rhythm: Normal rate and regular rhythm.     Heart sounds: Normal heart sounds.  Pulmonary:     Effort: Pulmonary effort is normal. No respiratory distress.     Breath sounds: No wheezing.  Abdominal:     General: Bowel sounds are normal. There is no distension.     Palpations: Abdomen is soft.     Comments: Ureterostomy,   Musculoskeletal:        General: No deformity. Normal range of motion.     Cervical back: Normal range of motion and neck supple.  Skin:    General: Skin is warm and dry.     Coloration: Skin is not pale.     Findings: No erythema or rash.  Neurological:     Mental Status: She is alert and oriented to person, place, and time. Mental status is at baseline.     Cranial Nerves: No cranial nerve deficit.     Coordination: Coordination normal.  Psychiatric:        Mood and Affect: Mood normal.   . LABORATORY DATA:  I have reviewed the data as listed    Latest Ref Rng & Units 09/17/2023    2:27 PM 06/18/2023    9:45 AM 05/30/2023    3:47 PM  CBC  WBC 4.0 - 10.5 K/uL 5.3  6.0  5.3   Hemoglobin 12.0 - 15.0 g/dL 9.4  9.7  9.9   Hematocrit 36.0 - 46.0 % 30.6  31.5  32.6   Platelets 150 - 400 K/uL 260  270  287       Latest Ref Rng & Units 09/17/2023    2:27 PM 06/18/2023    9:45 AM 05/30/2023    3:47 PM  CMP  Glucose 70 - 99 mg/dL 962  952  841   BUN 8 - 23 mg/dL 27  43  26   Creatinine 0.44 - 1.00 mg/dL 3.24  4.01  0.27   Sodium 135 - 145 mmol/L 138  137  142   Potassium 3.5 - 5.1 mmol/L 3.6  5.2  4.4   Chloride 98 - 111 mmol/L 106  108  104   CO2 22 - 32  mmol/L 22  20  25    Calcium 8.9 - 10.3 mg/dL  8.6  9.0  9.2   Total Protein 6.5 - 8.1 g/dL 7.4  7.5  7.0   Total Bilirubin 0.0 - 1.2 mg/dL 0.5  0.4  <7.2   Alkaline Phos 38 - 126 U/L 82  91  116   AST 15 - 41 U/L 22  20  16    ALT 0 - 44 U/L 14  14  11       Lab Results  Component Value Date   IRON 65 09/17/2023   TIBC 339 09/17/2023   FERRITIN 167 09/17/2023    RADIOGRAPHIC STUDIES: I have personally reviewed the radiological images as listed and agreed with the findings in the report. No results found.

## 2023-09-18 ENCOUNTER — Telehealth: Payer: Self-pay

## 2023-09-18 NOTE — Telephone Encounter (Signed)
-----   Message from Timmy Forbes sent at 09/17/2023  9:06 PM EDT ----- Please let patient know that her kidney function is at baseline.  Hemoglobin 9.4, stable.  Continue oral iron supplementation with stool softener.

## 2023-09-18 NOTE — Telephone Encounter (Signed)
 Spoke to pt and informed her about kidney function and hemoglobin and to continue iron supplement with stool softener. Pt verbalized understanding.

## 2023-09-20 ENCOUNTER — Ambulatory Visit
Admission: RE | Admit: 2023-09-20 | Discharge: 2023-09-20 | Disposition: A | Discharge status: Home / Self Care | Source: Ambulatory Visit | Attending: Oncology | Admitting: Oncology

## 2023-09-20 DIAGNOSIS — M79605 Pain in left leg: Secondary | ICD-10-CM | POA: Diagnosis not present

## 2023-09-20 DIAGNOSIS — M79604 Pain in right leg: Secondary | ICD-10-CM | POA: Insufficient documentation

## 2023-09-20 DIAGNOSIS — Z0189 Encounter for other specified special examinations: Secondary | ICD-10-CM | POA: Diagnosis not present

## 2023-09-24 ENCOUNTER — Telehealth: Payer: Self-pay

## 2023-09-24 NOTE — Telephone Encounter (Signed)
-----   Message from Timmy Forbes sent at 09/23/2023  9:52 PM EDT ----- Please let pt know that US  of her lower extremities showed no DVT

## 2023-09-24 NOTE — Telephone Encounter (Signed)
 Patient informed and verbalized understanding

## 2023-10-03 ENCOUNTER — Ambulatory Visit: Payer: Self-pay

## 2023-10-03 NOTE — Telephone Encounter (Signed)
  Chief Complaint: bilateral leg pain Symptoms: bilateral lower leg pain and swelling, warmth Frequency: worsening x 2 weeks Pertinent Negatives: Patient denies chest pain, difficulty breathing, rash, fevers, recent hospitalization, recent prolonged travel Disposition: [] ED /[] Urgent Care (no appt availability in office) / [x] Appointment(In office/virtual)/ []  [facility]/ [] Home Care/ [] Refused Recommended Disposition /[] [facility]/ []  Follow-up with PCP Additional Notes: Offered urgent care appointment today and patient declined, states she would like to wait til tomorrow morning to see PCP. Advised she call back for any new or worsening symptoms. (Patient with recent US  on 09/20/23 of BLE, negative for DVT).  Copied from CRM [phone] [id number]. Topic: Clinical - Red Word Triage >> Oct 03, 2023  8:41 AM Baldomero Bone wrote: Red Word that prompted transfer to Nurse Triage: Pain legs. getting worse day by day. Callback number is [phone] Reason for Disposition  [1] SEVERE pain (e.g., excruciating, unable to do any normal activities) AND [2] not improved after 2 hours of pain medicine  Answer Assessment - Initial Assessment Questions 1. ONSET: "When did the pain start?"      Worsening x 2 weeks.  2. LOCATION: "Where is the pain located?"      Bilateral legs, from knee down.  3. PAIN: "How bad is the pain?"    (Scale 1-10; or mild, moderate, severe)   -  MILD (1-3): doesn't interfere with normal activities    -  MODERATE (4-7): interferes with normal activities (e.g., work or school) or awakens from sleep, limping    -  SEVERE (8-10): excruciating pain, unable to do any normal activities, unable to walk     10/10, she states she usually takes a Tylenol  but that has not been helping.  4. WORK OR EXERCISE: "Has there been any recent work or exercise that involved this part of the body?"      Denies.  5. CAUSE: "What do you think is causing the leg pain?"     Unsure.  6. OTHER  SYMPTOMS: "Do you have any other symptoms?" (e.g., chest pain, back pain, breathing difficulty, swelling, rash, fever, numbness, weakness)     Moderate swelling in lower legs.  7. PREGNANCY: "Is there any chance you are pregnant?" "When was your last menstrual period?"     N/A.  Protocols used: Leg Pain-A-AH

## 2023-10-04 ENCOUNTER — Encounter: Payer: Self-pay | Admitting: Nurse Practitioner

## 2023-10-04 ENCOUNTER — Ambulatory Visit (INDEPENDENT_AMBULATORY_CARE_PROVIDER_SITE_OTHER): Admitting: Nurse Practitioner

## 2023-10-04 VITALS — BP 135/77 | HR 99 | Temp 97.7°F | Wt 206.4 lb

## 2023-10-04 DIAGNOSIS — M1712 Unilateral primary osteoarthritis, left knee: Secondary | ICD-10-CM | POA: Diagnosis not present

## 2023-10-04 DIAGNOSIS — M25561 Pain in right knee: Secondary | ICD-10-CM | POA: Diagnosis not present

## 2023-10-04 DIAGNOSIS — G8929 Other chronic pain: Secondary | ICD-10-CM

## 2023-10-04 DIAGNOSIS — R051 Acute cough: Secondary | ICD-10-CM | POA: Diagnosis not present

## 2023-10-04 DIAGNOSIS — M79605 Pain in left leg: Secondary | ICD-10-CM

## 2023-10-04 DIAGNOSIS — M79604 Pain in right leg: Secondary | ICD-10-CM

## 2023-10-04 MED ORDER — AZITHROMYCIN 250 MG PO TABS: ORAL_TABLET | ORAL | 0 refills | Status: AC

## 2023-10-04 MED ORDER — AMLODIPINE BESYLATE 10 MG PO TABS: 10.0000 mg | ORAL_TABLET | Freq: Every day | ORAL | 0 refills | Status: DC

## 2023-10-04 NOTE — Progress Notes (Signed)
 BP 135/77   Pulse 99   Temp 97.7 F (36.5 C) (Oral)   Wt 206 lb 6.4 oz (93.6 kg)   SpO2 97%   BMI 36.56 kg/m    Subjective:    Patient ID: Angela Russell, female    DOB: 1951-03-11, 73 y.o.   MRN: [id number]  HPI: Angela Russell is a 73 y.o. female  Chief Complaint  Patient presents with   Knee Pain   URI    Pt states she has had a cold since Tuesday    LEG SWELLING Was seen by Oncology on 4/15 and complained of pain.  US  was obtained of bilateral lower extremities with no evidence of clots.  Patient states she has been having lower leg swelling for over a month.  Patient states it is always there.  She has pain associated with it.  She has been taking tylenol  which does help.  She does elevate her legs in the evenings after work.  She drinks at least 8 glass of water  per day.  She feels like it is coming from her knees.   UPPER RESPIRATORY TRACT INFECTION Worst symptom: Symptoms started Tuesday or Wednesday Fever: no Cough: yes Shortness of breath: no Wheezing: no Chest pain: no Chest tightness: no Chest congestion: no Nasal congestion: yes Runny nose: yes Post nasal drip: no Sneezing: yes Sore throat: no Swollen glands: no Sinus pressure: no Headache: yes Face pain: no Toothache: no Ear pain: no bilateral Ear pressure: no bilateral Eyes red/itching:no Eye drainage/crusting: no  Vomiting: no Rash: no Fatigue: yes Sick contacts: no Strep contacts: no  Context: better Recurrent sinusitis: no Relief with OTC cold/cough medications: yes  Treatments attempted:  tylenol      Relevant past medical, surgical, family and social history reviewed and updated as indicated. Interim medical history since our last visit reviewed. Allergies and medications reviewed and updated.  Review of Systems  Constitutional:  Positive for fatigue. Negative for fever.  HENT:  Positive for congestion, postnasal drip, rhinorrhea and sneezing. Negative for dental problem, ear  pain, sinus pressure, sinus pain and sore throat.   Respiratory:  Positive for cough. Negative for shortness of breath and wheezing.   Cardiovascular:  Positive for leg swelling. Negative for chest pain.  Gastrointestinal:  Negative for vomiting.  Musculoskeletal:  Positive for arthralgias.  Skin:  Negative for rash.  Neurological:  Positive for headaches.    Per HPI unless specifically indicated above     Objective:    BP 135/77   Pulse 99   Temp 97.7 F (36.5 C) (Oral)   Wt 206 lb 6.4 oz (93.6 kg)   SpO2 97%   BMI 36.56 kg/m   Wt Readings from Last 3 Encounters:  10/04/23 206 lb 6.4 oz (93.6 kg)  09/17/23 210 lb (95.3 kg)  08/21/23 214 lb 3.2 oz (97.2 kg)    Physical Exam Vitals and nursing note reviewed.  Constitutional:      General: She is not in acute distress.    Appearance: Normal appearance. She is normal weight. She is not ill-appearing, toxic-appearing or diaphoretic.  HENT:     Head: Normocephalic.     Right Ear: Tympanic membrane and external ear normal.     Left Ear: Tympanic membrane and external ear normal.     Nose: Congestion and rhinorrhea present.     Mouth/Throat:     Mouth: Mucous membranes are moist.     Pharynx: Posterior oropharyngeal erythema present. No oropharyngeal exudate.  Eyes:     General:        Right eye: No discharge.        Left eye: No discharge.     Extraocular Movements: Extraocular movements intact.     Conjunctiva/sclera: Conjunctivae normal.     Pupils: Pupils are equal, round, and reactive to light.  Cardiovascular:     Rate and Rhythm: Normal rate and regular rhythm.     Heart sounds: No murmur heard. Pulmonary:     Effort: Pulmonary effort is normal. No respiratory distress.     Breath sounds: Normal breath sounds. No wheezing or rales.  Musculoskeletal:     Cervical back: Normal range of motion and neck supple.  Skin:    General: Skin is warm and dry.     Capillary Refill: Capillary refill takes less than 2  seconds.  Neurological:     General: No focal deficit present.     Mental Status: She is alert and oriented to person, place, and time. Mental status is at baseline.  Psychiatric:        Mood and Affect: Mood normal.        Behavior: Behavior normal.        Thought Content: Thought content normal.        Judgment: Judgment normal.     Results for orders placed or performed in visit on 09/17/23  Lactate dehydrogenase   Collection Time: 09/17/23  2:27 PM  Result Value Ref Range   LDH 139 98 - 192 U/L  Ferritin   Collection Time: 09/17/23  2:27 PM  Result Value Ref Range   Ferritin 167 11 - 307 ng/mL  Iron  and TIBC   Collection Time: 09/17/23  2:27 PM  Result Value Ref Range   Iron  65 28 - 170 ug/dL   TIBC 696 295 - 284 ug/dL   Saturation Ratios 19 10.4 - 31.8 %   UIBC 274 ug/dL  CMP (Cancer Center only)   Collection Time: 09/17/23  2:27 PM  Result Value Ref Range   Sodium 138 135 - 145 mmol/L   Potassium 3.6 3.5 - 5.1 mmol/L   Chloride 106 98 - 111 mmol/L   CO2 22 22 - 32 mmol/L   Glucose, Bld 118 (H) 70 - 99 mg/dL   BUN 27 (H) 8 - 23 mg/dL   Creatinine 1.32 (H) 4.40 - 1.00 mg/dL   Calcium  8.6 (L) 8.9 - 10.3 mg/dL   Total Protein 7.4 6.5 - 8.1 g/dL   Albumin 3.8 3.5 - 5.0 g/dL   AST 22 15 - 41 U/L   ALT 14 0 - 44 U/L   Alkaline Phosphatase 82 38 - 126 U/L   Total Bilirubin 0.5 0.0 - 1.2 mg/dL   GFR, Estimated 33 (L) >60 mL/min   Anion gap 10 5 - 15  CBC with Differential (Cancer Center Only)   Collection Time: 09/17/23  2:27 PM  Result Value Ref Range   WBC Count 5.3 4.0 - 10.5 K/uL   RBC 3.82 (L) 3.87 - 5.11 MIL/uL   Hemoglobin 9.4 (L) 12.0 - 15.0 g/dL   HCT 10.2 (L) 72.5 - 36.6 %   MCV 80.1 80.0 - 100.0 fL   MCH 24.6 (L) 26.0 - 34.0 pg   MCHC 30.7 30.0 - 36.0 g/dL   RDW 44.0 34.7 - 42.5 %   Platelet Count 260 150 - 400 K/uL   nRBC 0.0 0.0 - 0.2 %   Neutrophils Relative % 63 %  Neutro Abs 3.4 1.7 - 7.7 K/uL   Lymphocytes Relative 27 %   Lymphs Abs 1.5  0.7 - 4.0 K/uL   Monocytes Relative 7 %   Monocytes Absolute 0.4 0.1 - 1.0 K/uL   Eosinophils Relative 2 %   Eosinophils Absolute 0.1 0.0 - 0.5 K/uL   Basophils Relative 0 %   Basophils Absolute 0.0 0.0 - 0.1 K/uL   Immature Granulocytes 1 %   Abs Immature Granulocytes 0.03 0.00 - 0.07 K/uL      Assessment & Plan:   Problem List Items Addressed This Visit       Musculoskeletal and Integument   Arthritis of left knee   Seen on xray in 2020.  See procedure note below.      Other Visit Diagnoses       Bilateral leg pain    -  Primary   Ongoing x 1 month. Note reviewed from Heme/Onc. US  unremarkable for clots. Suspect pain and swelling is musculoskeletal. If not improved can decrease amlodipine      Chronic pain of right knee       Will obtain xray of right knee.  If arthritis is seen can do joint injection to help with symptoms.   Relevant Orders   DG Knee 1-2 Views Right     Acute cough       Will treat with azithromycin.       Procedure: Left  Knee Intraarticular Steroid Injection        Diagnosis:   ICD-10-CM   1. Bilateral leg pain  M79.604    M79.605    Ongoing x 1 month. Note reviewed from Heme/Onc. US  unremarkable for clots. Suspect pain and swelling is musculoskeletal. If not improved can decrease amlodipine     2. Chronic pain of right knee  M25.561 DG Knee 1-2 Views Right   G89.29    Will obtain xray of right knee.  If arthritis is seen can do joint injection to help with symptoms.    3. Arthritis of left knee  M17.12     4. Acute cough  R05.1    Will treat with azithromycin.      Provider: Aileen Alexanders, NP Consent:  Risks, benefits, and alternative treatments discussed and all questions were answered.  Patient elected to proceed and verbal consent obtained.  Description: Area prepped and draped using  semi-sterile technique.  Using a anterior/lateral approach, a mixture of 4 cc of  1% lidocaine  & 1 cc of Kenalog 40 was injected into knee joint.  A  bandage was then placed over the injection site. Complications: none Post Procedure Instructions: Wound care instructions discussed and patient was instructed to keep area clean and dry.  Signs and symptoms of infection discussed, patient agrees to contact the office ASAP should they occur.  Follow Up: Return in about 2 weeks (around 10/18/2023) for Leg swelling follow up.   Follow up plan: Return in about 2 weeks (around 10/18/2023) for Leg swelling follow up.

## 2023-10-04 NOTE — Assessment & Plan Note (Signed)
 Seen on xray in 2020.  See procedure note below.

## 2023-10-29 ENCOUNTER — Encounter

## 2023-11-01 ENCOUNTER — Inpatient Hospital Stay: Attending: Oncology

## 2023-11-01 ENCOUNTER — Ambulatory Visit: Admitting: Nurse Practitioner

## 2023-11-01 DIAGNOSIS — Z8551 Personal history of malignant neoplasm of bladder: Secondary | ICD-10-CM | POA: Diagnosis not present

## 2023-11-01 DIAGNOSIS — Z452 Encounter for adjustment and management of vascular access device: Secondary | ICD-10-CM | POA: Diagnosis not present

## 2023-11-01 DIAGNOSIS — N179 Acute kidney failure, unspecified: Secondary | ICD-10-CM

## 2023-11-01 DIAGNOSIS — C679 Malignant neoplasm of bladder, unspecified: Secondary | ICD-10-CM

## 2023-11-01 MED ORDER — SODIUM CHLORIDE 0.9% FLUSH
10.0000 mL | Freq: Once | INTRAVENOUS | Status: AC | PRN
  Administered 2023-11-01: 10 mL
  Filled 2023-11-01: qty 10

## 2023-11-01 MED ORDER — HEPARIN SOD (PORK) LOCK FLUSH 100 UNIT/ML IV SOLN
500.0000 [IU] | Freq: Once | INTRAVENOUS | Status: AC | PRN
  Administered 2023-11-01: 500 [IU]
  Filled 2023-11-01: qty 5

## 2023-11-13 ENCOUNTER — Ambulatory Visit: Payer: Medicare HMO | Admitting: Radiation Oncology

## 2023-11-14 ENCOUNTER — Telehealth: Payer: Self-pay | Admitting: *Deleted

## 2023-11-14 NOTE — Telephone Encounter (Signed)
 Rescheduled appointment patient notified.

## 2023-11-14 NOTE — Telephone Encounter (Signed)
 Patient called said that she had a death in the family and so she missed her June 11 appointment with Chrystal which was a 35-month appointment.  I will get radiation staff  a message for the patient that I am sending this telephone to the radiation and they can call her and give her a new appointment.

## 2023-11-25 ENCOUNTER — Encounter: Payer: Self-pay | Admitting: Nurse Practitioner

## 2023-11-25 ENCOUNTER — Ambulatory Visit (INDEPENDENT_AMBULATORY_CARE_PROVIDER_SITE_OTHER): Admitting: Nurse Practitioner

## 2023-11-25 ENCOUNTER — Encounter: Payer: Self-pay | Admitting: Radiation Oncology

## 2023-11-25 ENCOUNTER — Ambulatory Visit
Admission: RE | Admit: 2023-11-25 | Discharge: 2023-11-25 | Disposition: A | Discharge status: Home / Self Care | Source: Ambulatory Visit | Attending: Radiation Oncology | Admitting: Radiation Oncology

## 2023-11-25 VITALS — BP 136/68 | HR 98 | Temp 98.3°F | Resp 16 | Wt 203.0 lb

## 2023-11-25 VITALS — BP 132/72 | HR 102 | Ht 63.0 in | Wt 205.0 lb

## 2023-11-25 DIAGNOSIS — M25562 Pain in left knee: Secondary | ICD-10-CM | POA: Diagnosis not present

## 2023-11-25 DIAGNOSIS — C50912 Malignant neoplasm of unspecified site of left female breast: Secondary | ICD-10-CM

## 2023-11-25 DIAGNOSIS — D0512 Intraductal carcinoma in situ of left breast: Secondary | ICD-10-CM | POA: Insufficient documentation

## 2023-11-25 DIAGNOSIS — Z923 Personal history of irradiation: Secondary | ICD-10-CM | POA: Insufficient documentation

## 2023-11-25 DIAGNOSIS — Z171 Estrogen receptor negative status [ER-]: Secondary | ICD-10-CM | POA: Insufficient documentation

## 2023-11-25 DIAGNOSIS — G8929 Other chronic pain: Secondary | ICD-10-CM | POA: Diagnosis not present

## 2023-11-25 DIAGNOSIS — Z8554 Personal history of malignant neoplasm of ureter: Secondary | ICD-10-CM | POA: Insufficient documentation

## 2023-11-25 MED ORDER — METHYLPREDNISOLONE 4 MG PO TBPK
ORAL_TABLET | ORAL | 0 refills | Status: DC
Start: 2023-11-25 — End: 2024-04-15

## 2023-11-25 NOTE — Progress Notes (Signed)
 Radiation Oncology Follow up Note  Name: Angela Russell   Date:   11/25/2023 MRN:  [id number] DOB: 1951/01/17    This 73 y.o. 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  Seen today in routine follow-up she is doing well.  She specifically denies breast tenderness cough or bone pain.  She is not on endocrine therapy based on the ER negative nature of her disease..  She had mammograms back in November which I have reviewed were BI-RADS 2 benign.  She also had a CT scan of chest abdomen pelvis showing status post cystectomy and ileal conduit creation with small bowel containing parastomal hernia.  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  She has no evidence of progressive disease by CT criteria.  She also had right external iliac venous stent with right greater than left pelvic sidewall fullness similar to prior examination.  COMPLICATIONS OF TREATMENT: none  FOLLOW UP COMPLIANCE: keeps appointments   PHYSICAL EXAM:  BP 136/68   Pulse 98   Temp 98.3 F (36.8 C) (Tympanic)   Resp 16   Wt 203 lb (92.1 kg)   BMI 35.96 kg/m  Lungs are clear to A&P cardiac examination essentially unremarkable with regular rate and rhythm. No dominant mass or nodularity is noted in either breast in 2 positions examined. Incision is well-healed. No axillary or supraclavicular adenopathy is appreciated. Cosmetic result is excellent.  Well-developed well-nourished patient in NAD. HEENT reveals PERLA, EOMI, discs not visualized.  Oral cavity is clear. No oral mucosal lesions  are identified. Neck is clear without evidence of cervical or supraclavicular adenopathy. Lungs are clear to A&P. Cardiac examination is essentially unremarkable with regular rate and rhythm without murmur rub or thrill. Abdomen is benign with no organomegaly or masses noted. Motor sensory and DTR levels are equal and symmetric in the upper and lower extremities. Cranial nerves II through XII are grossly intact. Proprioception is intact. No peripheral adenopathy or edema is identified. No motor or sensory levels are noted. Crude visual fields are within normal range.  RADIOLOGY RESULTS: CT scans of mammograms reviewed compatible with above-stated findings  PLAN: At this time patient continues close follow-up with Dr. Babara for her urethral carcinoma stage IV.  I will turn follow-up care over to Dr. Babara for her breast cancer also.  Be happy to reevaluate the patient anytime should that be indicated.  Patient knows to call with any concerns.  I would like to take this opportunity to thank you for allowing me to participate in the care of your patient.SABRA Marcey Penton, MD

## 2023-11-25 NOTE — Progress Notes (Signed)
 BP 132/72   Pulse (!) 102   Ht 5' 3 (1.6 m)   Wt 205 lb (93 kg)   BMI 36.31 kg/m    Subjective:    Patient ID: Angela Russell, female    DOB: 31-Dec-1950, 73 y.o.   MRN: [id number]  HPI: Angela Russell is a 73 y.o. female  Chief Complaint  Patient presents with   Pain    Left leg pain, present a while, history of injection, denies injury   KNEE PAIN Duration: chronic Involved knee: left Mechanism of injury: unknown Location:diffuse Onset: sudden Severity: 10/10  Quality:  aching Frequency: constant Radiation: no Aggravating factors: weight bearing  Alleviating factors: tylenol  and brace  Status: worse Treatments attempted: tylenol   Relief with NSAIDs?:  No NSAIDs Taken Weakness with weight bearing or walking: yes Sensation of giving way: no Locking: no Popping: no Bruising: no Swelling: yes Redness: no Paresthesias/decreased sensation: no Fevers: no  Relevant past medical, surgical, family and social history reviewed and updated as indicated. Interim medical history since our last visit reviewed. Allergies and medications reviewed and updated.  Review of Systems  Musculoskeletal:        Left knee pain    Per HPI unless specifically indicated above     Objective:    BP 132/72   Pulse (!) 102   Ht 5' 3 (1.6 m)   Wt 205 lb (93 kg)   BMI 36.31 kg/m   Wt Readings from Last 3 Encounters:  11/25/23 205 lb (93 kg)  11/25/23 203 lb (92.1 kg)  10/04/23 206 lb 6.4 oz (93.6 kg)    Physical Exam Vitals and nursing note reviewed.  Constitutional:      General: She is not in acute distress.    Appearance: Normal appearance. She is normal weight. She is not ill-appearing, toxic-appearing or diaphoretic.  HENT:     Head: Normocephalic.     Right Ear: External ear normal.     Left Ear: External ear normal.     Nose: Nose normal.     Mouth/Throat:     Mouth: Mucous membranes are moist.     Pharynx: Oropharynx is clear.   Eyes:     General:         Right eye: No discharge.        Left eye: No discharge.     Extraocular Movements: Extraocular movements intact.     Conjunctiva/sclera: Conjunctivae normal.     Pupils: Pupils are equal, round, and reactive to light.    Cardiovascular:     Rate and Rhythm: Normal rate and regular rhythm.     Heart sounds: No murmur heard. Pulmonary:     Effort: Pulmonary effort is normal. No respiratory distress.     Breath sounds: Normal breath sounds. No wheezing or rales.   Musculoskeletal:        General: Swelling (left knee) present.     Cervical back: Normal range of motion and neck supple.   Skin:    General: Skin is warm and dry.     Capillary Refill: Capillary refill takes less than 2 seconds.   Neurological:     General: No focal deficit present.     Mental Status: She is alert and oriented to person, place, and time. Mental status is at baseline.   Psychiatric:        Mood and Affect: Mood normal.        Behavior: Behavior normal.  Thought Content: Thought content normal.        Judgment: Judgment normal.     Results for orders placed or performed in visit on 09/17/23  Lactate dehydrogenase   Collection Time: 09/17/23  2:27 PM  Result Value Ref Range   LDH 139 98 - 192 U/L  Ferritin   Collection Time: 09/17/23  2:27 PM  Result Value Ref Range   Ferritin 167 11 - 307 ng/mL  Iron  and TIBC   Collection Time: 09/17/23  2:27 PM  Result Value Ref Range   Iron  65 28 - 170 ug/dL   TIBC 660 749 - 549 ug/dL   Saturation Ratios 19 10.4 - 31.8 %   UIBC 274 ug/dL  CMP (Cancer Center only)   Collection Time: 09/17/23  2:27 PM  Result Value Ref Range   Sodium 138 135 - 145 mmol/L   Potassium 3.6 3.5 - 5.1 mmol/L   Chloride 106 98 - 111 mmol/L   CO2 22 22 - 32 mmol/L   Glucose, Bld 118 (H) 70 - 99 mg/dL   BUN 27 (H) 8 - 23 mg/dL   Creatinine 8.37 (H) 9.55 - 1.00 mg/dL   Calcium  8.6 (L) 8.9 - 10.3 mg/dL   Total Protein 7.4 6.5 - 8.1 g/dL   Albumin 3.8 3.5 - 5.0 g/dL    AST 22 15 - 41 U/L   ALT 14 0 - 44 U/L   Alkaline Phosphatase 82 38 - 126 U/L   Total Bilirubin 0.5 0.0 - 1.2 mg/dL   GFR, Estimated 33 (L) >60 mL/min   Anion gap 10 5 - 15  CBC with Differential (Cancer Center Only)   Collection Time: 09/17/23  2:27 PM  Result Value Ref Range   WBC Count 5.3 4.0 - 10.5 K/uL   RBC 3.82 (L) 3.87 - 5.11 MIL/uL   Hemoglobin 9.4 (L) 12.0 - 15.0 g/dL   HCT 69.3 (L) 63.9 - 53.9 %   MCV 80.1 80.0 - 100.0 fL   MCH 24.6 (L) 26.0 - 34.0 pg   MCHC 30.7 30.0 - 36.0 g/dL   RDW 85.6 88.4 - 84.4 %   Platelet Count 260 150 - 400 K/uL   nRBC 0.0 0.0 - 0.2 %   Neutrophils Relative % 63 %   Neutro Abs 3.4 1.7 - 7.7 K/uL   Lymphocytes Relative 27 %   Lymphs Abs 1.5 0.7 - 4.0 K/uL   Monocytes Relative 7 %   Monocytes Absolute 0.4 0.1 - 1.0 K/uL   Eosinophils Relative 2 %   Eosinophils Absolute 0.1 0.0 - 0.5 K/uL   Basophils Relative 0 %   Basophils Absolute 0.0 0.0 - 0.1 K/uL   Immature Granulocytes 1 %   Abs Immature Granulocytes 0.03 0.00 - 0.07 K/uL      Assessment & Plan:   Problem List Items Addressed This Visit   None Visit Diagnoses       Chronic pain of left knee    -  Primary   Ongoing pain. Improved with joint injection for several weeks but pain has returned. Will send oral steroids and send to Ortho for evaluation and management.   Relevant Medications   methylPREDNISolone  (MEDROL  DOSEPAK) 4 MG TBPK tablet   Other Relevant Orders   DG Knee Complete 4 Views Left   Ambulatory referral to Orthopedic Surgery        Follow up plan: Return if symptoms worsen or fail to improve.

## 2023-11-29 ENCOUNTER — Ambulatory Visit: Admitting: Nurse Practitioner

## 2023-12-08 DIAGNOSIS — Z008 Encounter for other general examination: Secondary | ICD-10-CM | POA: Diagnosis not present

## 2023-12-09 ENCOUNTER — Other Ambulatory Visit: Payer: Self-pay | Admitting: Nurse Practitioner

## 2023-12-09 DIAGNOSIS — I10 Essential (primary) hypertension: Secondary | ICD-10-CM

## 2023-12-11 NOTE — Telephone Encounter (Signed)
 Requested by interface surescripts. Last OV 3 months ago  Requested Prescriptions  Pending Prescriptions Disp Refills   RYBELSUS  7 MG TABS [Pharmacy Med Name: Rybelsus  7 MG Oral Tablet] 90 tablet 0    Sig: Take 1 tablet by mouth once daily     Off-Protocol Failed - 12/11/2023  4:20 PM      Failed - Medication not assigned to a protocol, review manually.      Passed - Valid encounter within last 12 months    Recent Outpatient Visits           2 weeks ago Chronic pain of left knee   [place] [facility] Melvin Pao, NP   2 months ago Bilateral leg pain   [place] [facility] [place], Pao, NP   3 months ago Primary hypertension   [place] [facility] Melvin Pao, NP               amLODipine  (NORVASC ) 10 MG tablet [Pharmacy Med Name: amLODIPine  Besylate 10 MG Oral Tablet] 30 tablet 0    Sig: Take 1 tablet by mouth once daily     Cardiovascular: Calcium  Channel Blockers 2 Passed - 12/11/2023  4:20 PM      Passed - Last BP in normal range    BP Readings from Last 1 Encounters:  11/25/23 136/68         Passed - Last Heart Rate in normal range    Pulse Readings from Last 1 Encounters:  11/25/23 98         Passed - Valid encounter within last 6 months    Recent Outpatient Visits           2 weeks ago Chronic pain of left knee   [place] [facility] Melvin Pao, NP   2 months ago Bilateral leg pain   [place] [facility] [place], Pao, NP   3 months ago Primary hypertension   [place] [facility] Melvin Pao, NP               olmesartan  (BENICAR ) 40 MG tablet [Pharmacy Med Name: Olmesartan  Medoxomil 40 MG Oral Tablet] 90 tablet 0    Sig: Take 1 tablet by mouth once daily     Cardiovascular:  Angiotensin Receptor Blockers Failed - 12/11/2023  4:20 PM      Failed - Cr in normal range and within 180 days    Creatinine  Date Value Ref Range  Status  09/17/2023 1.62 (H) 0.44 - 1.00 mg/dL Final         Passed - K in normal range and within 180 days    Potassium  Date Value Ref Range Status  09/17/2023 3.6 3.5 - 5.1 mmol/L Final         Passed - Patient is not pregnant      Passed - Last BP in normal range    BP Readings from Last 1 Encounters:  11/25/23 136/68         Passed - Valid encounter within last 6 months    Recent Outpatient Visits           2 weeks ago Chronic pain of left knee   [place] [facility] Melvin Pao, NP   2 months ago Bilateral leg pain   [place] [facility] Melvin Pao, NP   3 months ago Primary hypertension    [facility] Melvin Pao, NP  Refused Prescriptions Disp Refills   losartan  (COZAAR ) 25 MG tablet [Pharmacy Med Name: Losartan  Potassium 25 MG Oral Tablet] 30 tablet 0    Sig: Take 1 tablet by mouth once daily     Cardiovascular:  Angiotensin Receptor Blockers Failed - 12/11/2023  4:20 PM      Failed - Cr in normal range and within 180 days    Creatinine  Date Value Ref Range Status  09/17/2023 1.62 (H) 0.44 - 1.00 mg/dL Final         Passed - K in normal range and within 180 days    Potassium  Date Value Ref Range Status  09/17/2023 3.6 3.5 - 5.1 mmol/L Final         Passed - Patient is not pregnant      Passed - Last BP in normal range    BP Readings from Last 1 Encounters:  11/25/23 136/68         Passed - Valid encounter within last 6 months    Recent Outpatient Visits           2 weeks ago Chronic pain of left knee   [place] [facility] Melvin Pao, NP   2 months ago Bilateral leg pain   [place] [facility] Melvin Pao, NP   3 months ago Primary hypertension    [facility] Melvin Pao, NP

## 2023-12-11 NOTE — Telephone Encounter (Signed)
 Requested by interface surescripts. Medication discontinued 05/22/23.  Requested Prescriptions  Pending Prescriptions Disp Refills   RYBELSUS  7 MG TABS [Pharmacy Med Name: Rybelsus  7 MG Oral Tablet] 90 tablet 0    Sig: Take 1 tablet by mouth once daily     Off-Protocol Failed - 12/11/2023  4:14 PM      Failed - Medication not assigned to a protocol, review manually.      Passed - Valid encounter within last 12 months    Recent Outpatient Visits           2 weeks ago Chronic pain of left knee   [place] [facility] Melvin Pao, NP   2 months ago Bilateral leg pain   [place] [facility] [place], Pao, NP   3 months ago Primary hypertension   [place] [facility] Melvin Pao, NP               amLODipine  (NORVASC ) 10 MG tablet [Pharmacy Med Name: amLODIPine  Besylate 10 MG Oral Tablet] 30 tablet 0    Sig: Take 1 tablet by mouth once daily     Cardiovascular: Calcium  Channel Blockers 2 Passed - 12/11/2023  4:14 PM      Passed - Last BP in normal range    BP Readings from Last 1 Encounters:  11/25/23 136/68         Passed - Last Heart Rate in normal range    Pulse Readings from Last 1 Encounters:  11/25/23 98         Passed - Valid encounter within last 6 months    Recent Outpatient Visits           2 weeks ago Chronic pain of left knee   [place] [facility] Melvin Pao, NP   2 months ago Bilateral leg pain   [place] [facility] [place], Pao, NP   3 months ago Primary hypertension   [place] [facility] Melvin Pao, NP               olmesartan  (BENICAR ) 40 MG tablet [Pharmacy Med Name: Olmesartan  Medoxomil 40 MG Oral Tablet] 90 tablet 0    Sig: Take 1 tablet by mouth once daily     Cardiovascular:  Angiotensin Receptor Blockers Failed - 12/11/2023  4:14 PM      Failed - Cr in normal range and within 180 days    Creatinine  Date Value  Ref Range Status  09/17/2023 1.62 (H) 0.44 - 1.00 mg/dL Final         Passed - K in normal range and within 180 days    Potassium  Date Value Ref Range Status  09/17/2023 3.6 3.5 - 5.1 mmol/L Final         Passed - Patient is not pregnant      Passed - Last BP in normal range    BP Readings from Last 1 Encounters:  11/25/23 136/68         Passed - Valid encounter within last 6 months    Recent Outpatient Visits           2 weeks ago Chronic pain of left knee   [place] [facility] Melvin Pao, NP   2 months ago Bilateral leg pain   [place] [facility] Melvin Pao, NP   3 months ago Primary hypertension   [place] [facility] Melvin Pao, NP  Refused Prescriptions Disp Refills   losartan  (COZAAR ) 25 MG tablet [Pharmacy Med Name: Losartan  Potassium 25 MG Oral Tablet] 30 tablet 0    Sig: Take 1 tablet by mouth once daily     Cardiovascular:  Angiotensin Receptor Blockers Failed - 12/11/2023  4:14 PM      Failed - Cr in normal range and within 180 days    Creatinine  Date Value Ref Range Status  09/17/2023 1.62 (H) 0.44 - 1.00 mg/dL Final         Passed - K in normal range and within 180 days    Potassium  Date Value Ref Range Status  09/17/2023 3.6 3.5 - 5.1 mmol/L Final         Passed - Patient is not pregnant      Passed - Last BP in normal range    BP Readings from Last 1 Encounters:  11/25/23 136/68         Passed - Valid encounter within last 6 months    Recent Outpatient Visits           2 weeks ago Chronic pain of left knee   [place] [facility] Melvin Pao, NP   2 months ago Bilateral leg pain   [place] [facility] Melvin Pao, NP   3 months ago Primary hypertension   [place] [facility] Melvin Pao, NP

## 2023-12-11 NOTE — Telephone Encounter (Signed)
 Requested medication (s) are due for refill today: expired medication date   Requested medication (s) are on the active medication list: yes   Last refill:  08/29/22 #90 1 refills  Future visit scheduled: no   Notes to clinic:  expired medication date. Medication not assigned to a protocol. Do you want to refill Rx?     Requested Prescriptions  Pending Prescriptions Disp Refills   RYBELSUS  7 MG TABS [Pharmacy Med Name: Rybelsus  7 MG Oral Tablet] 90 tablet 0    Sig: Take 1 tablet by mouth once daily     Off-Protocol Failed - 12/11/2023  4:20 PM      Failed - Medication not assigned to a protocol, review manually.      Passed - Valid encounter within last 12 months    Recent Outpatient Visits           2 weeks ago Chronic pain of left knee   [place] [facility] Melvin Pao, NP   2 months ago Bilateral leg pain   [place] [facility] [place], Pao, NP   3 months ago Primary hypertension   [place] [facility] Melvin Pao, NP              Signed Prescriptions Disp Refills   amLODipine  (NORVASC ) 10 MG tablet 30 tablet 0    Sig: Take 1 tablet by mouth once daily     Cardiovascular: Calcium  Channel Blockers 2 Passed - 12/11/2023  4:20 PM      Passed - Last BP in normal range    BP Readings from Last 1 Encounters:  11/25/23 136/68         Passed - Last Heart Rate in normal range    Pulse Readings from Last 1 Encounters:  11/25/23 98         Passed - Valid encounter within last 6 months    Recent Outpatient Visits           2 weeks ago Chronic pain of left knee   [place] [facility] Melvin Pao, NP   2 months ago Bilateral leg pain   [place] [facility] [employer], Pao, NP   3 months ago Primary hypertension    [facility] Melvin Pao, NP               olmesartan  (BENICAR ) 40 MG tablet 90 tablet 0    Sig: Take 1 tablet by  mouth once daily     Cardiovascular:  Angiotensin Receptor Blockers Failed - 12/11/2023  4:20 PM      Failed - Cr in normal range and within 180 days    Creatinine  Date Value Ref Range Status  09/17/2023 1.62 (H) 0.44 - 1.00 mg/dL Final         Passed - K in normal range and within 180 days    Potassium  Date Value Ref Range Status  09/17/2023 3.6 3.5 - 5.1 mmol/L Final         Passed - Patient is not pregnant      Passed - Last BP in normal range    BP Readings from Last 1 Encounters:  11/25/23 136/68         Passed - Valid encounter within last 6 months    Recent Outpatient Visits           2 weeks ago Chronic pain of left knee    [facility],  Darice, NP   2 months ago Bilateral leg pain   [place] [facility] [place], Darice, NP   3 months ago Primary hypertension   [place] [facility] Melvin Darice, NP              Refused Prescriptions Disp Refills   losartan  (COZAAR ) 25 MG tablet [Pharmacy Med Name: Losartan  Potassium 25 MG Oral Tablet] 30 tablet 0    Sig: Take 1 tablet by mouth once daily     Cardiovascular:  Angiotensin Receptor Blockers Failed - 12/11/2023  4:20 PM      Failed - Cr in normal range and within 180 days    Creatinine  Date Value Ref Range Status  09/17/2023 1.62 (H) 0.44 - 1.00 mg/dL Final         Passed - K in normal range and within 180 days    Potassium  Date Value Ref Range Status  09/17/2023 3.6 3.5 - 5.1 mmol/L Final         Passed - Patient is not pregnant      Passed - Last BP in normal range    BP Readings from Last 1 Encounters:  11/25/23 136/68         Passed - Valid encounter within last 6 months    Recent Outpatient Visits           2 weeks ago Chronic pain of left knee   [place] [facility] Melvin Darice, NP   2 months ago Bilateral leg pain   [place] [facility] Melvin Darice, NP   3 months ago  Primary hypertension   [place] [facility] Melvin Darice, NP

## 2023-12-12 ENCOUNTER — Other Ambulatory Visit: Payer: Self-pay | Admitting: Nurse Practitioner

## 2023-12-12 NOTE — Telephone Encounter (Signed)
 Copied from CRM [id number]. Topic: Clinical - Medication Refill >> Dec 12, 2023  1:05 PM Donee H wrote: Medication: Semaglutide  (RYBELSUS ) 7 MG TABS   Has the patient contacted their pharmacy? Yes (Agent: If no, request that the patient contact the pharmacy for the refill. If patient does not wish to contact the pharmacy document the reason why and proceed with request.) (Agent: If yes, when and what did the pharmacy advise?)  This is the patient's preferred pharmacy:  [facility] [address]), [place] - [address] [place]) [place] [address] Phone: [phone] Fax: [phone]  Is this the correct pharmacy for this prescription? Yes If no, delete pharmacy and type the correct one.   Has the prescription been filled recently? No  Is the patient out of the medication? Yes  Has the patient been seen for an appointment in the last year OR does the patient have an upcoming appointment? Yes  Can we respond through MyChart? No  Agent: Please be advised that Rx refills may take up to 3 business days. We ask that you follow-up with your pharmacy.

## 2023-12-13 ENCOUNTER — Ambulatory Visit
Admission: RE | Admit: 2023-12-13 | Discharge: 2023-12-13 | Disposition: A | Discharge status: Home / Self Care | Source: Ambulatory Visit | Attending: Oncology | Admitting: Oncology

## 2023-12-13 DIAGNOSIS — Z906 Acquired absence of other parts of urinary tract: Secondary | ICD-10-CM | POA: Insufficient documentation

## 2023-12-13 DIAGNOSIS — Z932 Ileostomy status: Secondary | ICD-10-CM | POA: Diagnosis not present

## 2023-12-13 DIAGNOSIS — M1712 Unilateral primary osteoarthritis, left knee: Secondary | ICD-10-CM | POA: Diagnosis not present

## 2023-12-13 DIAGNOSIS — D0512 Intraductal carcinoma in situ of left breast: Secondary | ICD-10-CM | POA: Diagnosis not present

## 2023-12-13 DIAGNOSIS — Z90710 Acquired absence of both cervix and uterus: Secondary | ICD-10-CM | POA: Insufficient documentation

## 2023-12-13 DIAGNOSIS — Z8550 Personal history of malignant neoplasm of unspecified urinary tract organ: Secondary | ICD-10-CM | POA: Diagnosis not present

## 2023-12-13 NOTE — Telephone Encounter (Signed)
 Requested medication (s) are due for refill today - no  Requested medication (s) are on the active medication list -yes  Future visit scheduled -no  Last refill: 12/12/23 #90 1RF  Notes to clinic: off protocol- duplicate request  Requested Prescriptions  Pending Prescriptions Disp Refills   Semaglutide  (RYBELSUS ) 7 MG TABS 90 tablet 1    Sig: Take 1 tablet (7 mg total) by mouth daily.     Off-Protocol Failed - 12/13/2023  4:02 PM      Failed - Medication not assigned to a protocol, review manually.      Passed - Valid encounter within last 12 months    Recent Outpatient Visits           2 weeks ago Chronic pain of left knee   [place] [facility] Melvin Pao, NP   2 months ago Bilateral leg pain   [place] [facility] Mel[facility] Pa[facility], NP   3 months ago Primary hypertension   [place] Pa[facility], NP                 Requested Prescriptions  Pending Prescriptions Disp Refills   Semaglutide  (RYBELSUS ) 7 MG TABS 90 tablet 1    Sig: Take 1 tablet (7 mg total) by mouth daily.     Off-Protocol Failed - 12/13/2023  4:02 PM      Failed - Medication not assigned to a protocol, review manually.      Passed - Valid encounter within last 12 months    Recent Outpatient Visits           2 weeks ago Chronic pain of left knee   [place] [facility] Mel[facility] Pao, NP   2 months ago Bilateral leg pain   [place] [facility] Melvin Pao, NP   3 months ago Primary hypertension   [facility] Melvin Pa[facility], NP

## 2023-12-24 ENCOUNTER — Inpatient Hospital Stay: Attending: Oncology

## 2023-12-24 ENCOUNTER — Encounter: Payer: Self-pay | Admitting: Oncology

## 2023-12-24 ENCOUNTER — Inpatient Hospital Stay (HOSPITAL_BASED_OUTPATIENT_CLINIC_OR_DEPARTMENT_OTHER): Admitting: Oncology

## 2023-12-24 VITALS — BP 131/69 | HR 96 | Temp 97.4°F | Resp 16 | Wt 207.0 lb

## 2023-12-24 DIAGNOSIS — Z95828 Presence of other vascular implants and grafts: Secondary | ICD-10-CM

## 2023-12-24 DIAGNOSIS — Z86000 Personal history of in-situ neoplasm of breast: Secondary | ICD-10-CM | POA: Diagnosis not present

## 2023-12-24 DIAGNOSIS — Z8551 Personal history of malignant neoplasm of bladder: Secondary | ICD-10-CM | POA: Diagnosis not present

## 2023-12-24 DIAGNOSIS — C679 Malignant neoplasm of bladder, unspecified: Secondary | ICD-10-CM | POA: Diagnosis not present

## 2023-12-24 DIAGNOSIS — N1832 Chronic kidney disease, stage 3b: Secondary | ICD-10-CM

## 2023-12-24 DIAGNOSIS — Z923 Personal history of irradiation: Secondary | ICD-10-CM | POA: Diagnosis not present

## 2023-12-24 DIAGNOSIS — Z9221 Personal history of antineoplastic chemotherapy: Secondary | ICD-10-CM | POA: Insufficient documentation

## 2023-12-24 DIAGNOSIS — N184 Chronic kidney disease, stage 4 (severe): Secondary | ICD-10-CM | POA: Diagnosis not present

## 2023-12-24 DIAGNOSIS — D0512 Intraductal carcinoma in situ of left breast: Secondary | ICD-10-CM | POA: Diagnosis not present

## 2023-12-24 DIAGNOSIS — Z87891 Personal history of nicotine dependence: Secondary | ICD-10-CM | POA: Insufficient documentation

## 2023-12-24 DIAGNOSIS — D631 Anemia in chronic kidney disease: Secondary | ICD-10-CM | POA: Diagnosis not present

## 2023-12-24 LAB — CBC WITH DIFFERENTIAL (CANCER CENTER ONLY)
Abs Immature Granulocytes: 0.01 K/uL (ref 0.00–0.07)
Basophils Absolute: 0 K/uL (ref 0.0–0.1)
Basophils Relative: 1 %
Eosinophils Absolute: 0.1 K/uL (ref 0.0–0.5)
Eosinophils Relative: 2 %
HCT: 30 % — ABNORMAL LOW (ref 36.0–46.0)
Hemoglobin: 9.3 g/dL — ABNORMAL LOW (ref 12.0–15.0)
Immature Granulocytes: 0 %
Lymphocytes Relative: 28 %
Lymphs Abs: 1.4 K/uL (ref 0.7–4.0)
MCH: 24 pg — ABNORMAL LOW (ref 26.0–34.0)
MCHC: 31 g/dL (ref 30.0–36.0)
MCV: 77.3 fL — ABNORMAL LOW (ref 80.0–100.0)
Monocytes Absolute: 0.4 K/uL (ref 0.1–1.0)
Monocytes Relative: 8 %
Neutro Abs: 3 K/uL (ref 1.7–7.7)
Neutrophils Relative %: 61 %
Platelet Count: 262 K/uL (ref 150–400)
RBC: 3.88 MIL/uL (ref 3.87–5.11)
RDW: 14.6 % (ref 11.5–15.5)
WBC Count: 4.8 K/uL (ref 4.0–10.5)
nRBC: 0 % (ref 0.0–0.2)

## 2023-12-24 LAB — IRON AND TIBC
Iron: 69 ug/dL (ref 28–170)
Saturation Ratios: 21 % (ref 10.4–31.8)
TIBC: 329 ug/dL (ref 250–450)
UIBC: 260 ug/dL

## 2023-12-24 LAB — RETIC PANEL
Immature Retic Fract: 9.6 % (ref 2.3–15.9)
RBC.: 3.86 MIL/uL — ABNORMAL LOW (ref 3.87–5.11)
Retic Count, Absolute: 55.6 K/uL (ref 19.0–186.0)
Retic Ct Pct: 1.4 % (ref 0.4–3.1)
Reticulocyte Hemoglobin: 26.6 pg — ABNORMAL LOW (ref >27.9)

## 2023-12-24 LAB — CMP (CANCER CENTER ONLY)
ALT: 12 U/L (ref 0–44)
AST: 19 U/L (ref 15–41)
Albumin: 3.9 g/dL (ref 3.5–5.0)
Alkaline Phosphatase: 101 U/L (ref 38–126)
Anion gap: 8 (ref 5–15)
BUN: 30 mg/dL — ABNORMAL HIGH (ref 8–23)
CO2: 21 mmol/L — ABNORMAL LOW (ref 22–32)
Calcium: 9.1 mg/dL (ref 8.9–10.3)
Chloride: 111 mmol/L (ref 98–111)
Creatinine: 1.96 mg/dL — ABNORMAL HIGH (ref 0.44–1.00)
GFR, Estimated: 26 mL/min — ABNORMAL LOW (ref >60)
Glucose, Bld: 89 mg/dL (ref 70–99)
Potassium: 4.4 mmol/L (ref 3.5–5.1)
Sodium: 140 mmol/L (ref 135–145)
Total Bilirubin: 0.5 mg/dL (ref 0.0–1.2)
Total Protein: 7.2 g/dL (ref 6.5–8.1)

## 2023-12-24 LAB — FERRITIN: Ferritin: 136 ng/mL (ref 11–307)

## 2023-12-24 MED ORDER — SODIUM CHLORIDE 0.9% FLUSH
10.0000 mL | Freq: Once | INTRAVENOUS | Status: DC
  Filled 2023-12-24: qty 10

## 2023-12-24 MED ORDER — SODIUM CHLORIDE 0.9% FLUSH
10.0000 mL | Freq: Once | INTRAVENOUS | Status: AC
  Administered 2023-12-24: 10 mL via INTRAVENOUS
  Filled 2023-12-24: qty 10

## 2023-12-24 MED ORDER — HEPARIN SOD (PORK) LOCK FLUSH 100 UNIT/ML IV SOLN
500.0000 [IU] | Freq: Once | INTRAVENOUS | Status: AC
  Administered 2023-12-24: 500 [IU] via INTRAVENOUS
  Filled 2023-12-24: qty 5

## 2023-12-24 MED ORDER — HEPARIN SOD (PORK) LOCK FLUSH 100 UNIT/ML IV SOLN
500.0000 [IU] | Freq: Once | INTRAVENOUS | Status: DC
  Filled 2023-12-24: qty 5

## 2023-12-24 NOTE — Assessment & Plan Note (Signed)
Avoid nephrotoxin.,  Encourage hydration.

## 2023-12-24 NOTE — Assessment & Plan Note (Signed)
 DCIS, ER negative. S/p lumpectomy.  S/p adjuvant radiation.  Recommend annual mammogram surveillance. - Nov 2024 - negative.

## 2023-12-24 NOTE — Progress Notes (Signed)
 Patient had a CT scan on 12/13/2023.

## 2023-12-24 NOTE — Assessment & Plan Note (Addendum)
#  Metastatic urothelial carcinoma of bladder, sarcomatoid features pelvic sidewall mass biopsy showed metastatic carcinoma consistent with involvement by urothelial carcinoma., Stage IV NED- since 01/2020-patient was on Keytruda  every 3 to 4 weeks for 2.5 years.  Shared decision was made to stop treatment and continued surveillance. Labs reviewed and discussed with patient July 2025  CT abdomen and pelvis without contrast- NED

## 2023-12-24 NOTE — Assessment & Plan Note (Signed)
 Continue Port flush every 6-8 weeks  Discussed about option of medi port removal. She would like to keep the medi port until 5 years after NED on images. - March 2026

## 2023-12-24 NOTE — Assessment & Plan Note (Signed)
 Lab Results  Component Value Date   HGB 9.4 (L) 09/17/2023   TIBC 339 09/17/2023   IRONPCTSAT 19 09/17/2023   FERRITIN 167 09/17/2023    She declines IV Venofer due to fatigue  side effects. Hb is stable.  Recommend patient to continue iron supplementation

## 2023-12-24 NOTE — Progress Notes (Signed)
 Hematology/Oncology follow up note [phone] [phone]   Patient Care Team: Melvin Pao, NP as PCP - General Nice, Francis NOVAK, OD (Optometry) Babara Call, MD as Consulting Physician (Oncology) Arloa Mliss RAMAN, [facility] (Inactive) (Pharmacist) Georgina Shasta POUR, RN as Oncology Nurse Navigator Voora, [place], MD (Nephrology)  CHIEF COMPLAINTS/REASON FOR VISIT:  Follow up for bladder cancer, anemia due to CKD, ER negative DCIS  ASSESSMENT & PLAN:   Bladder carcinoma [facility]) #Metastatic urothelial carcinoma of bladder, sarcomatoid features pelvic sidewall mass biopsy showed metastatic carcinoma consistent with involvement by urothelial carcinoma., Stage IV NED- since 01/2020-patient was on Keytruda  every 3 to 4 weeks for 2.5 years.  Shared decision was made to stop treatment and continued surveillance. Labs reviewed and discussed with patient July 2025  CT abdomen and pelvis without contrast- NED   Ductal carcinoma in situ (DCIS) of left breast DCIS, ER negative. S/p lumpectomy.  S/p adjuvant radiation.  Recommend annual mammogram surveillance. - Nov 2024 - negative.   Anemia in stage 4 chronic kidney disease (HCC) Lab Results  Component Value Date   HGB 9.4 (L) 09/17/2023   TIBC 339 09/17/2023   IRONPCTSAT 19 09/17/2023   FERRITIN 167 09/17/2023    She declines IV Venofer  due to fatigue  side effects. Hb is stable.  Recommend patient to continue iron  supplementation    CKD (chronic kidney disease) Avoid nephrotoxin.,  Encourage hydration.   Port-A-Cath in place Continue Port flush every 6-8 weeks  Discussed about option of medi port removal. She would like to keep the medi port until 5 years after NED on images. - March 2026   Orders Placed This Encounter  Procedures   MM 3D DIAGNOSTIC MAMMOGRAM BILATERAL BREAST    Standing Status:   Future    Expected Date:   04/15/2024    Expiration Date:   12/23/2024    Reason for Exam (SYMPTOM  OR DIAGNOSIS  REQUIRED):   DCIS    Preferred imaging location?:   [facility]   CBC with Differential (Cancer Center Only)    Standing Status:   Future    Expected Date:   06/25/2024    Expiration Date:   09/23/2024   CMP (Cancer Center only)    Standing Status:   Future    Expected Date:   06/25/2024    Expiration Date:   09/23/2024   Iron  and TIBC    Standing Status:   Future    Expected Date:   06/25/2024    Expiration Date:   09/23/2024   Ferritin    Standing Status:   Future    Expected Date:   06/25/2024    Expiration Date:   09/23/2024   Lactate dehydrogenase    Standing Status:   Future    Expected Date:   06/25/2024    Expiration Date:   09/23/2024   Retic Panel    Standing Status:   Future    Expected Date:   06/25/2024    Expiration Date:   09/23/2024   Follow-up 6 months All questions were answered. The patient knows to call the clinic with any problems, questions or concerns.  Call Babara, MD, PhD [facility] Hematology Oncology 12/24/2023      HISTORY OF PRESENTING ILLNESS:  Oncology History  Bladder carcinoma (HCC)  07/01/2018 Imaging   CT renal stone study showed suspected irregular wall thickening about the superior bladder, not well assessed due to degree of bladder distention. Recommend cystoscopy for further evaluation  07/03/2018 Imaging   another CT abdomen pelvis with contrast was done which showed no nephrolithiasis or hydronephrosis is identified. Bladder is decompressed limiting evaluation.    07/16/2018 Initial Diagnosis   urology Dr. Sninsky - cystoscopy and bilateral retrograde pyelogram on 07/16/2018.  Pyelogram did not show any filling defect or abnormalities.  No hydronephrosis.  Ureteral orifice was not involved with tumor.  There is a large 5 cm posterior wall bladder tumor, bullous and sessile appearing.  Patient underwent TURBT.    Pathology: High-grade urothelial carcinoma, invasive into muscularis propria.  Lymphovascular invasion is present.  Carcinoma  in situ is also identified.  Focal squamous differentiation is noted, areas of invasive carcinoma display pleomorphic/sarcomatoid changes.  T2b     08/04/2018 -  Chemotherapy   Neoadjuvant DD MVAC x 1 cycle, stopped due to intolerance and acute kidney failure.  Patient was sent to [facility] urology for evaluation of cystectomy   08/07/2018 Imaging   CT without contrast negative. 2 subpleural right upper lobe nodule 2 to 3 mm likely benign.    09/22/2018 Surgery    patient underwent a cystectomy, pathology pT3a N0 Mx.  11 lymph nodes were harvested and was all negative. Invasive urothelia carcinoma, high grade, with sarcomatoid features.    02/09/2019 -  Hospital Admission   Patient has had difficulties following up with [facility].  Presented emergency room on 02/09/2023 abdominal pain.  CT concerning for small bowel obstruction and increased size of right pelvic fluid collection concerning for cancer recurrence.  Right hydronephrosis to the level of pelvis and enlarged lymph nodes. Patient had JP drain placed with CT guidance to pelvic fluid collection and drained 400 cc amber fluid.  Culture was negative for growth of microorganisms and cytology was negative for malignancy.-JP drain was removed on the day of discharge. CT-guided core biopsy of pelvic lymph node adenopathy was attempted but not successful.  She also developed right lower extremity DVT provoked by transvenous biopsy.- Post biopsy acute thrombus in the right external iliac and common femoral vein.  Patient was recommended to start anticoagulation with Xarelto however due to the co-pay, patient is not able to afford the medication.   02/25/2019 Procedure   IR Transcaval pelvic mass biopsy at [facility] showed metastatic carcinoma, consistent with involvement by urothelial carcinoma. PD-L1-CPS 100%   03/16/2019 Imaging   03/16/2019 patient was found to have increased lower extremity swelling, it was found that she was actually taking Eliquis  2.5 mg  twice daily.  Repeat right lower extremity ultrasound showed persistent extensive proximal right lower extremity DVT.  Anticoagulation regimen was changed to Eliquis  5 mg twice daily.   03/20/2019 Imaging   PET showed FDG avid tissue in the cystectomy bed, retroperitoneal and pelvic lymphadenopathy. Right common iliac DVT.    03/30/2019 Procedure   Status post IVC filter placement, mechanical thrombectomy.-IVC filter was retrieved in January 2021.    03/31/2019 -  Chemotherapy   Keytruda  every 3 weeks.   04/26/2020 Imaging   CT chest abdomen pelvis was reviewed and discussed with patient. No definitive finding of disease recurrence or metastasis.  Small amount of ascites similar to prior.There is a parastomal hernia containing a small margin of adjacent small bowel.  This is associated with wall thickening in approximately 7 cm segment of bowel adjacent to the hernia.  There is adjacent abnormal stranding of the mesentery and omentum.   08/19/2020 Imaging   CT chest abdomen pelvis showed no evidence of new/progressive metastatic disease. NED   #  Chronic abdominal discomfort due to hernia. 08/11/2020, patient was seen by Dr. Mannie for parastomal hernia.  Patient has no obstructive symptoms and would like to continue to monitor her symptoms and hold off any intervention at this point.  Patient was recommended to wear ostomy hernia belt and was referred to [facility] wound ostomy nursing team.   04/10/2021 Imaging   CT chest abdomen pelvis without contrast showed no evidence of disease recurrence. Small volume fluid in the pelvis, decreased. Emphysema/diffuse bilateral bronchial wall thickening. CAD.    10/02/2021 - 10/05/2021 Hospital Admission   atient was admitted due to sepsis secondary to UTI. Patient also had CT chest angiogram done during the hospitalization which showed no PE or pneumonia.. Lower extremity ultrasound showed negative for DVT. Patient was provided supportive care IV fluid hydration,  broad-spectrum antibiotics. COVID and influenza negative. Blood culture positive for E. coli. Patient was transitioned to oral antibiotics Levaquin  and completed 14 days of course.    05/10/2022 Imaging   CT chest abdomen pelvis with contrast showed 1. No findings of active malignancy in the chest, abdomen, or pelvis. Stable perivascular density along the pelvic sidewalls likely from previous treated malignancy. 2. Prior cystectomy and ileal conduit. The ileal conduit is no longer as prominent in size as it was on 11/03/2021. There is some borderline hydroureter of the right mid ureter but no hydronephrosis. 3. Peristomal hernia along the urostomy site contains a loop of distal small bowel. No findings of strangulation or obstruction. 4. Aberrant right subclavian artery passes behind the esophagus.5. Degenerative arthropathy of both hips. Grade 1 degenerative anterolisthesis of L5 on S1. 6. Aortic atherosclerosis.   Metastatic urothelial carcinoma (HCC)  03/23/2019 Initial Diagnosis   Metastatic urothelial carcinoma (HCC)   03/31/2019 - 09/05/2021 Chemotherapy   Patient is on Treatment Plan : Bladder - Pembrolizumab  Q28D     Ductal carcinoma in situ (DCIS) of left breast  04/23/2022 Mammogram   Bilateral screening mammogram showed left breast calcifications warrant further evaluation.  Right breast no findings suspicious for malignancy.   06/01/2022 Mammogram   Unilateral diagnostic mammogram showed 2 groups of indeterminate calcifications in the left breast, 1 in the upper outer left breast spanning 1.3 cm and another in the central inferior left breast spanning 1.2 cm.   06/13/2022 Cancer Staging   Staging form: Breast, AJCC 8th Edition - Clinical stage from 06/13/2022: Stage 0 (cTis (DCIS), cN0, cM0, G3, ER: Unknown, PR: Not Assessed, HER2: Not Assessed) - Signed by Babara Call, MD on 06/26/2022 Stage prefix: Initial diagnosis Nuclear grade: G3 Histologic grading system: 3 grade system    06/13/2022 Initial Diagnosis   Ductal carcinoma in situ (DCIS) of left breast  06/13/2022, patient underwent left breast medial and lateral biopsy. Medial biopsy showed DCIS, grade 3, focal comedo type necrosis present.  Calcifications identified. Left lateral biopsy showed breast parenchyma with focal fibrosis.  No significant atypia, calcifications identified.  Menarche 19 to 73 years of age History of hysterectomy in 2000. Previous use of OCP many years ago.  She is not able to recall how many years. Denies any hormone replacement therapy.  Denies previous chest radiation. Denies any family history of breast cancer.   07/17/2022 Surgery   S/p left breast lumpectomy  Pathology showed DCIS, Grade 3, all margins negative for DCIS ER negative [<1%]   09/21/2022 - 10/02/2022 Radiation Therapy   S/p adjuvant breast radiation.       INTERVAL HISTORY Angela Russell is a 73 y.o. female who  has above history reviewed by me today presents for evaluation of newly diagnosed left breast DCIS. Patient is known to me for history of metastatic high-grade urothelial carcinoma of the bladder, sarcomatoid feature, currently in remission.  She reports doing well currently.  She takes oral iron  supplementation. Constipation, she takes otc stool softner.    Review of Systems  Constitutional:  Negative for appetite change, chills, fatigue and fever.  HENT:   Negative for hearing loss and voice change.   Eyes:  Negative for eye problems.  Respiratory:  Negative for chest tightness and cough.   Cardiovascular:  Negative for chest pain and leg swelling.  Gastrointestinal:  Negative for abdominal distention, abdominal pain, blood in stool and constipation.  Endocrine: Negative for hot flashes.  Genitourinary:  Negative for difficulty urinating and frequency.   Musculoskeletal:  Positive for arthralgias.       Bilateral lower extremity tenderness  Skin:  Negative for itching and rash.  Neurological:   Positive for numbness. Negative for extremity weakness.  Hematological:  Negative for adenopathy.  Psychiatric/Behavioral:  Negative for confusion. The patient is not nervous/anxious.     MEDICAL HISTORY:  Past Medical History:  Diagnosis Date   Anemia in stage 4 chronic kidney disease (HCC)    Bladder carcinoma (HCC)    Carotid artery plaque, right 01/2014   CKD (chronic kidney disease)    stage 4-followed by nephrology   Controlled diabetes mellitus type 2 with complications (HCC)    Diabetes mellitus without complication (HCC)    DM (diabetes mellitus), type 2, uncontrolled    Ductal carcinoma in situ (DCIS) of breast    DVT (deep venous thrombosis) (HCC)    Hyperlipidemia    Hypertension    Hypochromic microcytic anemia    mild   Metastatic urothelial carcinoma (HCC) 03/23/2019   Osteoporosis    Parastomal hernia    Personal history of chemotherapy    for bladder cancer   Personal history of colonic polyps    Personal history of radiation therapy    Port-A-Cath in place    right side   Renal insufficiency    Rotator cuff tendonitis, right    Sepsis (HCC)    UTI (urinary tract infection) 07/03/2022   Wears dentures    full upper and lower    SURGICAL HISTORY: Past Surgical History:  Procedure Laterality Date   BREAST BIOPSY Left 06/13/2022   stereo biopsy/x clip/ positive   BREAST BIOPSY Left 06/13/2022   stereo biopsy/ ribbon clip/ negative   BREAST BIOPSY Left 06/13/2022   MM LT BREAST BX W LOC DEV EA AD LESION IMG BX SPEC STEREO GUIDE 06/13/2022 [facility]   BREAST BIOPSY Left 06/13/2022   MM LT BREAST BX W LOC DEV 1ST LESION IMAGE BX SPEC STEREO GUIDE 06/13/2022 [facility]   BREAST LUMPECTOMY Left 07/17/2022   BREAST LUMPECTOMY WITH RADIOFREQUENCY TAG IDENTIFICATION Left 07/17/2022   Procedure: BREAST LUMPECTOMY WITH RADIOFREQUENCY TAG IDENTIFICATION;  Surgeon: Desiderio Schanz, MD;  Location: [facility];  Service: General;  Laterality: Left;    CATARACT EXTRACTION W/PHACO Right 04/04/2023   Procedure: CATARACT EXTRACTION PHACO AND INTRAOCULAR LENS PLACEMENT (IOC) RIGHT DIABETIC 9.24 00:59.4;  Surgeon: Enola Feliciano Hugger, MD;  Location: [facility];  Service: Ophthalmology;  Laterality: Right;   CATARACT EXTRACTION W/PHACO Left 04/25/2023   Procedure: CATARACT EXTRACTION PHACO AND INTRAOCULAR LENS PLACEMENT (IOC) LEFT DIABETIC 7.70 00:54.6;  Surgeon: Enola Feliciano Hugger, MD;  Location: [facility];  Service: Ophthalmology;  Laterality: Left;  COLONOSCOPY WITH PROPOFOL  N/A 03/17/2021   Procedure: COLONOSCOPY WITH PROPOFOL ;  Surgeon: Therisa Bi, MD;  Location: [facility] ENDOSCOPY;  Service: Gastroenterology;  Laterality: N/A;   CYSTOSCOPY W/ RETROGRADES Bilateral 07/16/2018   Procedure: CYSTOSCOPY WITH RETROGRADE PYELOGRAM;  Surgeon: Francisca Redell BROCKS, MD;  Location: [facility];  Service: Urology;  Laterality: Bilateral;   ESOPHAGOGASTRODUODENOSCOPY  03/17/2021   Procedure: ESOPHAGOGASTRODUODENOSCOPY (EGD);  Surgeon: Therisa Bi, MD;  Location: [facility] ENDOSCOPY;  Service: Gastroenterology;;   GIVENS CAPSULE STUDY N/A 06/19/2021   Procedure: GIVENS CAPSULE STUDY;  Surgeon: Therisa Bi, MD;  Location: [facility] ENDOSCOPY;  Service: Gastroenterology;  Laterality: N/A;   IVC FILTER INSERTION N/A 03/30/2019   Procedure: IVC FILTER INSERTION;  Surgeon: Marea Selinda RAMAN, MD;  Location: [facility] INVAS[facility] LAB;  Service: Cardiovascular;  Laterality: N/A;   IVC FILTER REMOVAL N/A 06/15/2019   Procedure: IVC FILTER REMOVAL;  Surgeon: Marea Selinda RAMAN, MD;  Location: [facility];  Service: Cardiovascular;  Laterality: N/A;   KNEE SURGERY Left 03/17/2013   torn meniscus   PERIPHERAL VASCULAR THROMBECTOMY Right 03/30/2019   Procedure: PERIPHERAL VASCULAR THROMBECTOMY;  Surgeon: Marea Selinda RAMAN, MD;  Location: [facility];  Service: Cardiovascular;  Laterality: Right;   PORTA CATH INSERTION N/A 08/06/2018   Procedure: PORTA CATH  INSERTION;  Surgeon: Marea Selinda RAMAN, MD;  Location: [facility];  Service: Cardiovascular;  Laterality: N/A;   TOTAL ABDOMINAL HYSTERECTOMY  2000   due to bleeding and fibroids, partial- still has ovaries   TRANSURETHRAL RESECTION OF BLADDER TUMOR N/A 07/16/2018   Procedure: TRANSURETHRAL RESECTION OF BLADDER TUMOR (TURBT);  Surgeon: Francisca Redell BROCKS, MD;  Location: [facility];  Service: Urology;  Laterality: N/A;    SOCIAL HISTORY: Social History   Socioeconomic History   Marital status: Divorced    Spouse name: Not on file   Number of children: 1   Years of education: Not on file   Highest education level: 10th grade  Occupational History   Not on file  Tobacco Use   Smoking status: Former    Current packs/day: 0.00    Average packs/day: 1 pack/day for 10.0 years (10.0 ttl pk-yrs)    Types: Cigarettes    Start date: 06/04/1981    Quit date: 06/05/1991    Years since quitting: 32.5   Smokeless tobacco: Never  Vaping Use   Vaping status: Never Used  Substance and Sexual Activity   Alcohol use: No   Drug use: No   Sexual activity: Not Currently  Other Topics Concern   Not on file  Social History Narrative   Working full time   Social Drivers of Corporate investment banker Strain: Low Risk  (04/09/2023)   Overall Financial Resource Strain (CARDIA)    Difficulty of Paying Living Expenses: Not hard at all  Food Insecurity: No Food Insecurity (04/09/2023)   Hunger Vital Sign    Worried About Running Out of Food in the Last Year: Never true    Ran Out of Food in the Last Year: Never true  Transportation Needs: No Transportation Needs (04/09/2023)   PRAPARE - Administrator, Civil Service (Medical): No    Lack of Transportation (Non-Medical): No  Physical Activity: Insufficiently Active (04/09/2023)   Exercise Vital Sign    Days of Exercise per Week: 3 days    Minutes of Exercise per Session: 30 min  Stress: No Stress Concern Present (04/09/2023)   [employer] of Occupational Health - Occupational  Stress Questionnaire    Feeling of Stress : Not at all  Social Connections: Moderately Isolated (04/09/2023)   Social Connection and Isolation Panel    Frequency of Communication with Friends and Family: More than three times a week    Frequency of Social Gatherings with Friends and Family: More than three times a week    Attends Religious Services: More than 4 times per year    Active Member of [employer] or Organizations: No    Attends Banker Meetings: Never    Marital Status: Divorced  Catering manager Violence: Not At Risk (04/09/2023)   Humiliation, Afraid, Rape, and Kick questionnaire    Fear of Current or Ex-Partner: No    Emotionally Abused: No    Physically Abused: No    Sexually Abused: No    FAMILY HISTORY: Family History  Problem Relation Age of Onset   Heart disease Mother    Heart attack Mother    Arthritis Father    Diabetes Brother    Breast cancer Neg Hx     ALLERGIES:  has no known allergies.  MEDICATIONS:  Current Outpatient Medications  Medication Sig Dispense Refill   acetaminophen  (TYLENOL ) 500 MG tablet Take 2 tablets (1,000 mg total) by mouth every 6 (six) hours as needed for mild pain.     amLODipine  (NORVASC ) 10 MG tablet Take 1 tablet by mouth once daily 30 tablet 0   methylPREDNISolone  (MEDROL  DOSEPAK) 4 MG TBPK tablet Take as directed 21 tablet 0   olmesartan  (BENICAR ) 40 MG tablet Take 1 tablet by mouth once daily 90 tablet 0   Semaglutide  (RYBELSUS ) 7 MG TABS Take 1 tablet by mouth once daily (Patient not taking: Reported on 12/24/2023) 90 tablet 1   No current facility-administered medications for this visit.   Facility-Administered Medications Ordered in Other Visits  Medication Dose Route Frequency Provider Last Rate Last Admin   heparin  lock flush 100 UNIT/ML injection              PHYSICAL EXAMINATION: ECOG PERFORMANCE STATUS: 1 - Symptomatic but completely ambulatory Vitals:    12/24/23 1458  BP: 131/69  Pulse: 96  Resp: 16  Temp: (!) 97.4 F (36.3 C)  SpO2: 100%    Filed Weights   12/24/23 1458  Weight: 207 lb (93.9 kg)     Physical Exam Constitutional:      General: She is not in acute distress.    Appearance: She is obese.     Comments: Walk independently  HENT:     Head: Normocephalic and atraumatic.  Eyes:     General: No scleral icterus.    Pupils: Pupils are equal, round, and reactive to light.  Cardiovascular:     Rate and Rhythm: Normal rate and regular rhythm.     Heart sounds: Normal heart sounds.  Pulmonary:     Effort: Pulmonary effort is normal. No respiratory distress.     Breath sounds: No wheezing.  Abdominal:     General: Bowel sounds are normal. There is no distension.     Palpations: Abdomen is soft.     Comments: Ureterostomy,   Musculoskeletal:        General: No deformity. Normal range of motion.     Cervical back: Normal range of motion and neck supple.  Skin:    General: Skin is warm and dry.     Coloration: Skin is not pale.     Findings: No erythema or rash.  Neurological:  Mental Status: She is alert and oriented to person, place, and time. Mental status is at baseline.     Cranial Nerves: No cranial nerve deficit.     Coordination: Coordination normal.  Psychiatric:        Mood and Affect: Mood normal.   . LABORATORY DATA:  I have reviewed the data as listed    Latest Ref Rng & Units 12/24/2023    2:37 PM 09/17/2023    2:27 PM 06/18/2023    9:45 AM  CBC  WBC 4.0 - 10.5 K/uL 4.8  5.3  6.0   Hemoglobin 12.0 - 15.0 g/dL 9.3  9.4  9.7   Hematocrit 36.0 - 46.0 % 30.0  30.6  31.5   Platelets 150 - 400 K/uL 262  260  270       Latest Ref Rng & Units 12/24/2023    2:37 PM 09/17/2023    2:27 PM 06/18/2023    9:45 AM  CMP  Glucose 70 - 99 mg/dL 89  881  893   BUN 8 - 23 mg/dL 30  27  43   Creatinine 0.44 - 1.00 mg/dL 8.03  8.37  7.84   Sodium 135 - 145 mmol/L 140  138  137   Potassium 3.5 - 5.1 mmol/L  4.4  3.6  5.2   Chloride 98 - 111 mmol/L 111  106  108   CO2 22 - 32 mmol/L 21  22  20    Calcium  8.9 - 10.3 mg/dL 9.1  8.6  9.0   Total Protein 6.5 - 8.1 g/dL 7.2  7.4  7.5   Total Bilirubin 0.0 - 1.2 mg/dL 0.5  0.5  0.4   Alkaline Phos 38 - 126 U/L 101  82  91   AST 15 - 41 U/L 19  22  20    ALT 0 - 44 U/L 12  14  14       Lab Results  Component Value Date   IRON  69 12/24/2023   TIBC 329 12/24/2023   FERRITIN 136 12/24/2023    RADIOGRAPHIC STUDIES: I have personally reviewed the radiological images as listed and agreed with the findings in the report. CT CHEST ABDOMEN PELVIS WO CONTRAST Result Date: 12/15/2023 EXAM:  CT CHEST ABDOMEN PELVIS WITHOUT IV CONTRAST INDICATION:  Urothelial cancer TECHNIQUE: Spiral CT scanning was performed through the chest, abdomen and pelvis with no contrast. COMPARISON: 04/23/2023 FINDINGS: The cardiac size is within normal limits. There is no thoracic aortic aneurysm. No filling defects are identified in the central pulmonary arteries. The esophagus and thyroid  glands have a normal appearance. There is no mass or adenopathy in the chest. No pleural or pericardial effusion is present. A right-sided chest wall port is present. A stable 3 mm right upper lobe nodule is present on image 55/4. The lungs are otherwise clear. There are no suspicious pulmonary masses. There is no significant abnormality identified in the liver, spleen, pancreas and gallbladder. No calculus, obstruction, or soft tissue mass is present involving the kidneys. There are no adrenal masses. The abdominal bowel loops are unremarkable. There is no evidence of ascites or adenopathy. No abdominal aortic aneurysm is present. The bladder and uterus are surgically absent. There is no recurrent pelvic mass. There is a stable ileostomy in the right hemipelvis with an adjacent parastomal hernia containing nonobstructed small bowel. This appears unchanged. There is no inguinal hernia. The right iliac  venous stent is unchanged. The pelvic sidewalls soft tissue fullness, right greater than left,  remains unchanged. There is no fracture or bone destruction. IMPRESSION: 1. No evidence of local tumor recurrence. 2. Stable pelvic sidewall fullness, right greater than left. This may represent tumor which has been treated. 3. No new metastatic disease is identified. 4. Stable nonacute findings as described above. Please note that CT scanning at this site utilizes multiple dose reduction techniques, including automatic exposure control, adjustment of the MAA and/or KVP according to the patient's size, and use of iterative reconstruction. Electronically signed by: Eddy Oar MD 12/15/2023 03:58 PM EDT RP Workstation: [id number]

## 2024-01-06 DIAGNOSIS — M1712 Unilateral primary osteoarthritis, left knee: Secondary | ICD-10-CM | POA: Diagnosis not present

## 2024-01-20 ENCOUNTER — Ambulatory Visit: Payer: Self-pay

## 2024-01-20 DIAGNOSIS — S46911A Strain of unspecified muscle, fascia and tendon at shoulder and upper arm level, right arm, initial encounter: Secondary | ICD-10-CM | POA: Diagnosis not present

## 2024-01-20 DIAGNOSIS — M1712 Unilateral primary osteoarthritis, left knee: Secondary | ICD-10-CM | POA: Diagnosis not present

## 2024-01-20 DIAGNOSIS — M25511 Pain in right shoulder: Secondary | ICD-10-CM | POA: Diagnosis not present

## 2024-01-20 NOTE — Telephone Encounter (Signed)
 FYI Only or Action Required?: FYI only for provider.  Patient was last seen in primary care on 11/25/2023 by Angela Pao, NP.  Called Nurse Triage reporting Arm Injury.  Symptoms began several days ago.  Interventions attempted: Nothing.  Symptoms are: gradually worsening.  Triage Disposition: See Physician Within 24 Hours  Patient/caregiver understands and will follow disposition?: Yes   To UC           Copied from CRM #[id number]. Topic: Clinical - Red Word Triage >> Jan 20, 2024  9:43 AM Angela Russell wrote: Red Word that prompted transfer to Nurse Triage:  elavator door hit it and weak and unable to raise. saturday, [place] the [place] and [place] hotel door caught. right side. mild swollen. hurt and achey Reason for Disposition  [1] MODERATE pain (e.g., interferes with normal activities) AND [2] high-risk adult (e.g., age > 60 years, osteoporosis, chronic steroid use)  Answer Assessment - Initial Assessment Questions 1. MECHANISM: How did the injury happen?     Elevator door hit her arm, right arm 2. ONSET: When did the injury happen? (e.g., minutes, hours ago)      This weekend 3. LOCATION: Where is the injury located? Which arm?     Right arm 4. APPEARANCE of INJURY: What does the injury look like?      Mild swelling 5. SEVERITY: Can you use the arm normally?      No states hurts to move arm, can't lift it 6. SWELLING or BRUISING: is there any swelling or bruising? If Yes, ask: How large is it? (e.g., inches, centimeters)      denies 7. PAIN: Is there pain? If Yes, ask: How bad is the pain? (Scale 0-10; or none, mild, moderate, severe)     Moderate unless she raises it then severe 8. TETANUS: For any breaks in the skin, ask: When was your last tetanus booster?     na 9. OTHER SYMPTOMS: Do you have any other symptoms?  (e.g., numbness in hand)     Denies.  10. PREGNANCY: Is there any chance you are pregnant? When was your last menstrual  period?       na  Protocols used: Arm Injury-A-AH

## 2024-01-20 NOTE — Telephone Encounter (Signed)
 Forwarding to PCP awareness.

## 2024-02-21 ENCOUNTER — Inpatient Hospital Stay: Attending: Oncology

## 2024-02-21 DIAGNOSIS — Z452 Encounter for adjustment and management of vascular access device: Secondary | ICD-10-CM | POA: Insufficient documentation

## 2024-02-21 DIAGNOSIS — Z8551 Personal history of malignant neoplasm of bladder: Secondary | ICD-10-CM | POA: Diagnosis not present

## 2024-02-21 DIAGNOSIS — N184 Chronic kidney disease, stage 4 (severe): Secondary | ICD-10-CM | POA: Diagnosis present

## 2024-03-20 ENCOUNTER — Telehealth: Payer: Self-pay

## 2024-03-20 NOTE — Telephone Encounter (Unsigned)
 Copied from CRM #[id number]. Topic: Clinical - Request for Lab/Test Order >> Mar 20, 2024  1:59 PM Terri MATSU wrote: Reason for CRM: Patient would like to schedule lab work for A1C. Callback number [phone]

## 2024-03-22 ENCOUNTER — Other Ambulatory Visit: Payer: Self-pay | Admitting: Nurse Practitioner

## 2024-03-22 DIAGNOSIS — I10 Essential (primary) hypertension: Secondary | ICD-10-CM

## 2024-03-24 ENCOUNTER — Other Ambulatory Visit: Payer: Self-pay | Admitting: Nurse Practitioner

## 2024-03-24 DIAGNOSIS — I10 Essential (primary) hypertension: Secondary | ICD-10-CM

## 2024-03-24 MED ORDER — OLMESARTAN MEDOXOMIL 40 MG PO TABS: 40.0000 mg | ORAL_TABLET | Freq: Every day | ORAL | 0 refills | Status: DC

## 2024-03-24 NOTE — Telephone Encounter (Signed)
 Called patient to schedule OV for medication refills. Patient reports she has been out of medication benicar  40 mg for couple of days. Denies sx of elevated BP. Patient requesting if PCP can also check Hbg A1C during OV. Please advise.

## 2024-03-24 NOTE — Telephone Encounter (Signed)
 Requested by interface surescripts. Courtesy refill. Future visit 04/15/24.  Requested Prescriptions  Pending Prescriptions Disp Refills   olmesartan  (BENICAR ) 40 MG tablet 90 tablet 0    Sig: Take 1 tablet (40 mg total) by mouth daily.     Cardiovascular:  Angiotensin Receptor Blockers Failed - 03/24/2024  3:53 PM      Failed - Cr in normal range and within 180 days    Creatinine  Date Value Ref Range Status  12/24/2023 1.96 (H) 0.44 - 1.00 mg/dL Final         Failed - Valid encounter within last 6 months    Recent Outpatient Visits           4 months ago Chronic pain of left knee   [place] [facility] Melvin Pao, NP   5 months ago Bilateral leg pain   [place] [facility] [place], Pao, NP   7 months ago Primary hypertension   [place] [facility] Melvin Pao, NP              Passed - K in normal range and within 180 days    Potassium  Date Value Ref Range Status  12/24/2023 4.4 3.5 - 5.1 mmol/L Final         Passed - Patient is not pregnant      Passed - Last BP in normal range    BP Readings from Last 1 Encounters:  12/24/23 131/69

## 2024-03-25 NOTE — Telephone Encounter (Signed)
 Patient is overdue for an appointment with Darice. Please call to schedule office visit. Labs can be done at visit.

## 2024-03-25 NOTE — Telephone Encounter (Signed)
 Scheduled

## 2024-04-03 ENCOUNTER — Encounter: Payer: Self-pay | Admitting: Oncology

## 2024-04-03 ENCOUNTER — Inpatient Hospital Stay: Attending: Oncology

## 2024-04-03 NOTE — Telephone Encounter (Signed)
 Encounter opened in error.

## 2024-04-08 ENCOUNTER — Other Ambulatory Visit: Payer: Self-pay | Admitting: Nurse Practitioner

## 2024-04-09 NOTE — Telephone Encounter (Signed)
 Requested Prescriptions  Pending Prescriptions Disp Refills   amLODipine  (NORVASC ) 10 MG tablet [Pharmacy Med Name: amLODIPine  Besylate 10 MG Oral Tablet] 90 tablet 0    Sig: Take 1 tablet by mouth once daily     Cardiovascular: Calcium  Channel Blockers 2 Failed - 04/09/2024  3:31 PM      Failed - Valid encounter within last 6 months    Recent Outpatient Visits           4 months ago Chronic pain of left knee   [place] [facility] Melvin Pao, NP   6 months ago Bilateral leg pain   [place] [facility] Melvin Pao, NP   7 months ago Primary hypertension   [place] [facility] [place] Melvin Pao, NP              Passed - Last BP in normal range    BP Readings from Last 1 Encounters:  12/24/23 131/69         Passed - Last Heart Rate in normal range    Pulse Readings from Last 1 Encounters:  12/24/23 96

## 2024-04-14 ENCOUNTER — Ambulatory Visit: Payer: Self-pay | Admitting: Emergency Medicine

## 2024-04-14 ENCOUNTER — Ambulatory Visit: Admitting: Nurse Practitioner

## 2024-04-14 VITALS — Ht 62.0 in | Wt 210.0 lb

## 2024-04-14 DIAGNOSIS — Z0000 Encounter for general adult medical examination without abnormal findings: Secondary | ICD-10-CM

## 2024-04-14 NOTE — Patient Instructions (Signed)
 [name],  Thank you for taking the time for your Medicare Wellness Visit. I appreciate your continued commitment to your health goals. Please review the care plan we discussed, and feel free to reach out if I can assist you further.  Please note that Annual Wellness Visits do not include a physical exam. Some assessments may be limited, especially if the visit was conducted virtually. If needed, we may recommend an in-person follow-up with your provider.  Ongoing Care Seeing your primary care provider every 3 to 6 months helps us  monitor your health and provide consistent, personalized care.   Referrals If a referral was made during today's visit and you haven't received any updates within two weeks, please contact the referred provider directly to check on the status.  Recommended Screenings:    Schedule a diabetic eye exam with Dr. Francis Mallick at your earliest convenience. These should be done every year. You may get the flu vaccine at your next OV on 04/15/24.   Health Maintenance  Topic Date Due   DTaP/Tdap/Td vaccine (1 - Tdap) Never done   Zoster (Shingles) Vaccine (1 of 2) Never done   Complete foot exam   02/23/2023   Hemoglobin A1C  11/28/2023   Flu Shot  01/03/2024   COVID-19 Vaccine (4 - 2025-26 season) 02/03/2024   Eye exam for diabetics  02/26/2024   Medicare Annual Wellness Visit  04/08/2024   Breast Cancer Screening  04/22/2024   Yearly kidney health urinalysis for diabetes  05/29/2024   Yearly kidney function blood test for diabetes  12/23/2024   DEXA scan (bone density measurement)  04/20/2027   Colon Cancer Screening  03/18/2031   Pneumococcal Vaccine for age over 39  Completed   Hepatitis C Screening  Completed   Meningitis B Vaccine  Aged Out       04/14/2024    3:55 PM  Advanced Directives  Does Patient Have a Medical Advance Directive? Yes  Type of Estate Agent of [place];Living will  Does patient want to make changes to  medical advance directive? No - Guardian declined  Copy of Healthcare Power of Attorney in Chart? No - copy requested    Vision: Annual vision screenings are recommended for early detection of glaucoma, cataracts, and diabetic retinopathy. These exams can also reveal signs of chronic conditions such as diabetes and high blood pressure.  Dental: Annual dental screenings help detect early signs of oral cancer, gum disease, and other conditions linked to overall health, including heart disease and diabetes.  Please see the attached documents for additional preventive care recommendations.   Fall Prevention in the Home, Adult Falls can cause injuries and affect people of all ages. There are many simple things that you can do to make your home safe and to help prevent falls. If you need it, ask for help making these changes. What actions can I take to prevent falls? General information Use good lighting in all rooms. Make sure to: Replace any light bulbs that burn out. Turn on lights if it is dark and use night-lights. Keep items that you use often in easy-to-reach places. Lower the shelves around your home if needed. Move furniture so that there are clear paths around it. Do not keep throw rugs or other things on the floor that can make you trip. If any of your floors are uneven, fix them. Add color or contrast paint or tape to clearly mark and help you see: Grab bars or handrails. First and last  steps of staircases. Where the edge of each step is. If you use a ladder or stepladder: Make sure that it is fully opened. Do not climb a closed ladder. Make sure the sides of the ladder are locked in place. Have someone hold the ladder while you use it. Know where your pets are as you move through your home. What can I do in the bathroom?     Keep the floor dry. Clean up any water  that is on the floor right away. Remove soap buildup in the bathtub or shower. Buildup makes bathtubs and  showers slippery. Use non-skid mats or decals on the floor of the bathtub or shower. Attach bath mats securely with double-sided, non-slip rug tape. If you need to sit down while you are in the shower, use a non-slip stool. Install grab bars by the toilet and in the bathtub and shower. Do not use towel bars as grab bars. What can I do in the bedroom? Make sure that you have a light by your bed that is easy to reach. Do not use any sheets or blankets on your bed that hang to the floor. Have a firm bench or chair with side arms that you can use for support when you get dressed. What can I do in the kitchen? Clean up any spills right away. If you need to reach something above you, use a sturdy step stool that has a grab bar. Keep electrical cables out of the way. Do not use floor polish or wax that makes floors slippery. What can I do with my stairs? Do not leave anything on the stairs. Make sure that you have a light switch at the top and the bottom of the stairs. Have them installed if you do not have them. Make sure that there are handrails on both sides of the stairs. Fix handrails that are broken or loose. Make sure that handrails are as long as the staircases. Install non-slip stair treads on all stairs in your home if they do not have carpet. Avoid having throw rugs at the top or bottom of stairs, or secure the rugs with carpet tape to prevent them from moving. Choose a carpet design that does not hide the edge of steps on the stairs. Make sure that carpet is firmly attached to the stairs. Fix any carpet that is loose or worn. What can I do on the outside of my home? Use bright outdoor lighting. Repair the edges of walkways and driveways and fix any cracks. Clear paths of anything that can make you trip, such as tools or rocks. Add color or contrast paint or tape to clearly mark and help you see high doorway thresholds. Trim any bushes or trees on the main path into your home. Check  that handrails are securely fastened and in good repair. Both sides of all steps should have handrails. Install guardrails along the edges of any raised decks or porches. Have leaves, snow, and ice cleared regularly. Use sand, salt, or ice melt on walkways during winter months if you live where there is ice and snow. In the garage, clean up any spills right away, including grease or oil spills. What other actions can I take? Review your medicines with your health care provider. Some medicines can make you confused or feel dizzy. This can increase your chance of falling. Wear closed-toe shoes that fit well and support your feet. Wear shoes that have rubber soles and low heels. Use a cane, walker, scooter, or  crutches that help you move around if needed. Talk with your provider about other ways that you can decrease your risk of falls. This may include seeing a physical therapist to learn to do exercises to improve movement and strength. Where to find more information Centers for Disease Control and Prevention, [employer]: tonerpromos.no [employer] on Aging: baseringtones.pl National Institute on Aging: baseringtones.pl Contact a health care provider if: You are afraid of falling at home. You feel weak, drowsy, or dizzy at home. You fall at home. Get help right away if you: Lose consciousness or have trouble moving after a fall. Have a fall that causes a head injury. These symptoms may be an emergency. Get help right away. Call 911. Do not wait to see if the symptoms will go away. Do not drive yourself to the hospital. This information is not intended to replace advice given to you by your health care provider. Make sure you discuss any questions you have with your health care provider. Document Revised: 01/22/2022 Document Reviewed: 01/22/2022 Elsevier Patient Education  2024 [employer].

## 2024-04-14 NOTE — Progress Notes (Signed)
 Chief Complaint  Patient presents with   Medicare Wellness     Subjective:   Angela Russell is a 73 y.o. female who presents for a Medicare Annual Wellness Visit.  Allergies (verified) Patient has no known allergies.   History: Past Medical History:  Diagnosis Date   Anemia in stage 4 chronic kidney disease (HCC)    Bladder carcinoma (HCC)    Carotid artery plaque, right 01/2014   CKD (chronic kidney disease)    stage 4-followed by nephrology   Controlled diabetes mellitus type 2 with complications (HCC)    Diabetes mellitus without complication (HCC)    DM (diabetes mellitus), type 2, uncontrolled    Ductal carcinoma in situ (DCIS) of breast    DVT (deep venous thrombosis) (HCC)    Hyperlipidemia    Hypertension    Hypochromic microcytic anemia    mild   Metastatic urothelial carcinoma (HCC) 03/23/2019   Osteoporosis    Parastomal hernia    Personal history of chemotherapy    for bladder cancer   Personal history of colonic polyps    Personal history of radiation therapy    Port-A-Cath in place    right side   Renal insufficiency    Rotator cuff tendonitis, right    Sepsis (HCC)    UTI (urinary tract infection) 07/03/2022   Wears dentures    full upper and lower   Past Surgical History:  Procedure Laterality Date   BREAST BIOPSY Left 06/13/2022   stereo biopsy/x clip/ positive   BREAST BIOPSY Left 06/13/2022   stereo biopsy/ ribbon clip/ negative   BREAST BIOPSY Left 06/13/2022   MM LT BREAST BX W LOC DEV EA AD LESION IMG BX SPEC STEREO GUIDE 06/13/2022 [facility]   BREAST BIOPSY Left 06/13/2022   MM LT BREAST BX W LOC DEV 1ST LESION IMAGE BX SPEC STEREO GUIDE 06/13/2022 [facility]   BREAST LUMPECTOMY Left 07/17/2022   BREAST LUMPECTOMY WITH RADIOFREQUENCY TAG IDENTIFICATION Left 07/17/2022   Procedure: BREAST LUMPECTOMY WITH RADIOFREQUENCY TAG IDENTIFICATION;  Surgeon: Desiderio Schanz, MD;  Location: [facility];  Service: General;  Laterality:  Left;   CATARACT EXTRACTION W/PHACO Right 04/04/2023   Procedure: CATARACT EXTRACTION PHACO AND INTRAOCULAR LENS PLACEMENT (IOC) RIGHT DIABETIC 9.24 00:59.4;  Surgeon: Enola Feliciano Hugger, MD;  Location: [facility];  Service: Ophthalmology;  Laterality: Right;   CATARACT EXTRACTION W/PHACO Left 04/25/2023   Procedure: CATARACT EXTRACTION PHACO AND INTRAOCULAR LENS PLACEMENT (IOC) LEFT DIABETIC 7.70 00:54.6;  Surgeon: Enola Feliciano Hugger, MD;  Location: [facility];  Service: Ophthalmology;  Laterality: Left;   COLONOSCOPY WITH PROPOFOL  N/A 03/17/2021   Procedure: COLONOSCOPY WITH PROPOFOL ;  Surgeon: Therisa Bi, MD;  Location: [facility];  Service: Gastroenterology;  Laterality: N/A;   CYSTOSCOPY W/ RETROGRADES Bilateral 07/16/2018   Procedure: CYSTOSCOPY WITH RETROGRADE PYELOGRAM;  Surgeon: Francisca Redell BROCKS, MD;  Location: [facility];  Service: Urology;  Laterality: Bilateral;   ESOPHAGOGASTRODUODENOSCOPY  03/17/2021   Procedure: ESOPHAGOGASTRODUODENOSCOPY (EGD);  Surgeon: Therisa Bi, MD;  Location: [facility] END[facility];  Service: Gastroenterology;;   GIVENS CAPSULE STUDY N/A 06/19/2021   Procedure: GIVENS CAPSULE STUDY;  Surgeon: Therisa Bi, MD;  Location: [facility];  Service: Gastroenterology;  Laterality: N/A;   IVC FILTER INSERTION N/A 03/30/2019   Procedure: IVC FILTER INSERTION;  Surgeon: Marea Selinda RAMAN, MD;  Location: [facility];  Service: Cardiovascular;  Laterality: N/A;   IVC FILTER REMOVAL N/A 06/15/2019   Procedure: IVC FILTER REMOVAL;  Surgeon: Marea Selinda RAMAN, MD;  Location: [facility];  Service: Cardiovascular;  Laterality: N/A;   KNEE SURGERY Left 03/17/2013   torn meniscus   PERIPHERAL VASCULAR THROMBECTOMY Right 03/30/2019   Procedure: PERIPHERAL VASCULAR THROMBECTOMY;  Surgeon: Marea Selinda RAMAN, MD;  Location: [facility];  Service: Cardiovascular;  Laterality: Right;   PORTA CATH INSERTION N/A 08/06/2018   Procedure: PORTA CATH  INSERTION;  Surgeon: Marea Selinda RAMAN, MD;  Location: [facility];  Service: Cardiovascular;  Laterality: N/A;   TOTAL ABDOMINAL HYSTERECTOMY  2000   due to bleeding and fibroids, partial- still has ovaries   TRANSURETHRAL RESECTION OF BLADDER TUMOR N/A 07/16/2018   Procedure: TRANSURETHRAL RESECTION OF BLADDER TUMOR (TURBT);  Surgeon: Francisca Redell BROCKS, MD;  Location: [facility];  Service: Urology;  Laterality: N/A;   Family History  Problem Relation Age of Onset   Heart disease Mother    Heart attack Mother    Arthritis Father    Diabetes Brother    Breast cancer Neg Hx    Social History   Occupational History   Not on file  Tobacco Use   Smoking status: Former    Current packs/day: 0.00    Average packs/day: 1 pack/day for 10.0 years (10.0 ttl pk-yrs)    Types: Cigarettes    Start date: 06/04/1981    Quit date: 06/05/1991    Years since quitting: 32.8   Smokeless tobacco: Never  Vaping Use   Vaping status: Never Used  Substance and Sexual Activity   Alcohol use: No   Drug use: No   Sexual activity: Not Currently   Tobacco Counseling Counseling given: Not Answered  SDOH Screenings   Food Insecurity: No Food Insecurity (04/14/2024)  Housing: Low Risk  (04/14/2024)  Transportation Needs: No Transportation Needs (04/14/2024)  Utilities: Not At Risk (04/14/2024)  Alcohol Screen: Low Risk  (04/09/2023)  Depression (PHQ2-9): Low Risk  (04/14/2024)  Financial Resource Strain: Low Risk  (04/09/2023)  Physical Activity: Insufficiently Active (04/14/2024)  Social Connections: Moderately Isolated (04/14/2024)  Stress: No Stress Concern Present (04/14/2024)  Tobacco Use: Medium Risk (04/14/2024)  Health Literacy: Adequate Health Literacy (04/14/2024)   Depression Screen    04/14/2024    4:02 PM 12/24/2023    2:55 PM 10/04/2023    8:30 AM 08/21/2023    8:30 AM 05/30/2023    3:29 PM 05/22/2023    4:05 PM 04/09/2023    3:56 PM  PHQ 2/9 Scores  PHQ - 2 Score 0 0 0 0 0 0 0   PHQ- 9 Score 0  0  0  0  0  0      Data saved with a previous flowsheet row definition      Goals Addressed               This Visit's Progress     lose weight (pt-stated)         Visit info / Clinical Intake: Medicare Wellness Visit Type:: Subsequent Annual Wellness Visit Persons participating in visit:: patient Medicare Wellness Visit Mode:: Telephone If telephone:: video declined Because this visit was a virtual/telehealth visit:: pt reported vitals If Telephone or Video please confirm:: I connected with the patient using audio enabled telemedicine application and verified that I am speaking with the correct person using two identifiers; I discussed the limitations of evaluation and management by telemedicine; The patient expressed understanding and agreed to proceed Patient Location:: work Provider Location:: office/clinic Information given by:: patient Interpreter Needed?: No Pre-visit prep was completed:  yes AWV questionnaire completed by patient prior to visit?: no Living arrangements:: (!) lives alone Patient's Overall Health Status Rating: good Typical amount of pain: (!) a lot Does pain affect daily life?: no Are you currently prescribed opioids?: no  Dietary Habits and Nutritional Risks How many meals a day?: 3 Eats fruit and vegetables daily?: yes Most meals are obtained by: eating out In the last 2 weeks, have you had any of the following?: none Diabetic:: (!) yes Any non-healing wounds?: no How often do you check your BS?: 0 Would you like to be referred to a Nutritionist or for Diabetic Management? : no  Functional Status Activities of Daily Living (to include ambulation/medication): Independent Ambulation: Independent Medication Administration: Independent Home Management: Independent Manage your own finances?: yes Primary transportation is: driving Concerns about vision?: no *vision screening is required for WTM* Concerns about hearing?:  no  Fall Screening Falls in the past year?: 0 Number of falls in past year: 0 Was there an injury with Fall?: 0 Fall Risk Category Calculator: 0 Patient Fall Risk Level: Low Fall Risk  Fall Risk Patient at Risk for Falls Due to: No Fall Risks Fall risk Follow up: Falls evaluation completed  Home and Transportation Safety: All rugs have non-skid backing?: N/A, no rugs All stairs or steps have railings?: yes Grab bars in the bathtub or shower?: (!) no Have non-skid surface in bathtub or shower?: yes Good home lighting?: yes Regular seat belt use?: yes Hospital stays in the last year:: no  Cognitive Assessment Difficulty concentrating, remembering, or making decisions? : no Will 6CIT or Mini Cog be Completed: yes What year is it?: 0 points What month is it?: 0 points Give patient an address phrase to remember (5 components): [address] [place] About what time is it?: 3 points (3:00) Count backwards from 20 to 1: 0 points Say the months of the year in reverse: 0 points Repeat the address phrase from earlier: 0 points 6 CIT Score: 3 points  Advance Directives (For Healthcare) Does Patient Have a Medical Advance Directive?: Yes Does patient want to make changes to medical advance directive?: No - Guardian declined Type of Advance Directive: Healthcare Power of [place]; Living will Copy of Healthcare Power of Attorney in Chart?: No - copy requested Copy of Living Will in Chart?: No - copy requested Would patient like information on creating a medical advance directive?: No - Patient declined  Reviewed/Updated  Reviewed/Updated: Reviewed All (Medical, Surgical, Family, Medications, Allergies, Care Teams, Patient Goals)        Objective:    Today's Vitals   04/14/24 1549  Weight: 210 lb (95.3 kg)  Height: 5' 2 (1.575 m)   Body mass index is 38.41 kg/m.  Current Medications (verified) Outpatient Encounter Medications as of 04/14/2024  Medication Sig    acetaminophen  (TYLENOL ) 500 MG tablet Take 2 tablets (1,000 mg total) by mouth every 6 (six) hours as needed for mild pain.   amLODipine  (NORVASC ) 10 MG tablet Take 1 tablet by mouth once daily   Ferrous Sulfate  (IRON  PO) Take 1 tablet by mouth daily.   olmesartan  (BENICAR ) 40 MG tablet Take 1 tablet (40 mg total) by mouth daily.   methylPREDNISolone  (MEDROL  DOSEPAK) 4 MG TBPK tablet Take as directed (Patient not taking: Reported on 04/14/2024)   Semaglutide  (RYBELSUS ) 7 MG TABS Take 1 tablet by mouth once daily (Patient not taking: Reported on 04/14/2024)   Facility-Administered Encounter Medications as of 04/14/2024  Medication   heparin  lock  flush 100 UNIT/ML injection   Hearing/Vision screen Hearing Screening - Comments:: Denies hearing loss  Vision Screening - Comments:: Needs DM eye exam. Patient to call and schedule with Dr. Francis Mallick, [place] Immunizations and Health Maintenance Health Maintenance  Topic Date Due   DTaP/Tdap/Td (1 - Tdap) Never done   Zoster Vaccines- Shingrix (1 of 2) Never done   FOOT EXAM  02/23/2023   HEMOGLOBIN A1C  11/28/2023   Influenza Vaccine  01/03/2024   COVID-19 Vaccine (4 - 2025-26 season) 02/03/2024   OPHTHALMOLOGY EXAM  02/26/2024   Mammogram  04/22/2024   Diabetic kidney evaluation - Urine ACR  05/29/2024   Diabetic kidney evaluation - eGFR measurement  12/23/2024   Medicare Annual Wellness (AWV)  04/14/2025   DEXA SCAN  04/20/2027   Colonoscopy  03/18/2031   Pneumococcal Vaccine: 50+ Years  Completed   Hepatitis C Screening  Completed   Meningococcal B Vaccine  Aged Out        Assessment/Plan:  This is a routine wellness examination for [facility].  Patient Care Team: Melvin Pao, NP as PCP - General [place], Francis NOVAK, [place] (Optometry) Babara Call, MD as Consulting Physician (Oncology) Darr, Jacob, PA-C (Orthopedic Surgery) Twylla Glendia BROCKS, MD (Urology) [place] HERO, MD (Family Medicine)  I have personally reviewed and noted  the following in the patient's chart:   Medical and social history Use of alcohol, tobacco or illicit drugs  Current medications and supplements including opioid prescriptions. Functional ability and status Nutritional status Physical activity Advanced directives List of other physicians Hospitalizations, surgeries, and ER visits in previous 12 months Vitals Screenings to include cognitive, depression, and falls Referrals and appointments  No orders of the defined types were placed in this encounter.  In addition, I have reviewed and discussed with patient certain preventive protocols, quality metrics, and best practice recommendations. A written personalized care plan for preventive services as well as general preventive health recommendations were provided to patient.   Vina Ned, CMA   04/14/2024   Return in 1 year (on 04/14/2025).  After Visit Summary: (Mail) Due to this being a telephonic visit, the after visit summary with patients personalized plan was offered to patient via mail   Nurse Notes:  6 CIT Score - 3 Needs flu vaccine at next OV on 04/15/24 Needs DM foot exam at next OV on 04/15/24 Needs DM eye exam. Patient to call and schedule. Declined Covid and shingles vaccines Declined DM & Nutrition education referral Declined colonoscopy referral at this time. Last colon was 03/17/21 with repeat 1 yr (per 08/07/21 note from Dr. Ruel Kung)

## 2024-04-15 ENCOUNTER — Encounter: Payer: Self-pay | Admitting: Nurse Practitioner

## 2024-04-15 ENCOUNTER — Ambulatory Visit: Admitting: Nurse Practitioner

## 2024-04-15 VITALS — BP 156/69 | HR 103 | Temp 98.6°F | Ht 63.0 in | Wt 213.2 lb

## 2024-04-15 DIAGNOSIS — C679 Malignant neoplasm of bladder, unspecified: Secondary | ICD-10-CM

## 2024-04-15 DIAGNOSIS — Z7984 Long term (current) use of oral hypoglycemic drugs: Secondary | ICD-10-CM

## 2024-04-15 DIAGNOSIS — I708 Atherosclerosis of other arteries: Secondary | ICD-10-CM

## 2024-04-15 DIAGNOSIS — E118 Type 2 diabetes mellitus with unspecified complications: Secondary | ICD-10-CM | POA: Diagnosis not present

## 2024-04-15 DIAGNOSIS — N184 Chronic kidney disease, stage 4 (severe): Secondary | ICD-10-CM | POA: Diagnosis not present

## 2024-04-15 DIAGNOSIS — E782 Mixed hyperlipidemia: Secondary | ICD-10-CM

## 2024-04-15 DIAGNOSIS — N1832 Chronic kidney disease, stage 3b: Secondary | ICD-10-CM

## 2024-04-15 DIAGNOSIS — D631 Anemia in chronic kidney disease: Secondary | ICD-10-CM

## 2024-04-15 DIAGNOSIS — Z1211 Encounter for screening for malignant neoplasm of colon: Secondary | ICD-10-CM

## 2024-04-15 DIAGNOSIS — I10 Essential (primary) hypertension: Secondary | ICD-10-CM

## 2024-04-15 MED ORDER — AMLODIPINE BESYLATE 10 MG PO TABS: 10.0000 mg | ORAL_TABLET | Freq: Every day | ORAL | 0 refills | Status: AC

## 2024-04-15 MED ORDER — OLMESARTAN MEDOXOMIL 40 MG PO TABS: 40.0000 mg | ORAL_TABLET | Freq: Every day | ORAL | 0 refills | Status: AC

## 2024-04-15 NOTE — Assessment & Plan Note (Signed)
 Chronic.  Controlled.  Last A1c 6.9%.  Not currently on any medication.   Patient needs updated eye exam.  Labs ordered today.  Follow up in 6 months. Call sooner if concerns arise.

## 2024-04-15 NOTE — Assessment & Plan Note (Signed)
 Chronic.  Has been stable. Baseline creatinine of 1.6.  Labs ordered today.  Followed by Nephrology- has not ben seen since 2022.  Also followed at the Cancer center by Dr. Babara.  Hemoglobin down to 9.9 at visit today.   Continue to follow up with specialists.

## 2024-04-15 NOTE — Assessment & Plan Note (Signed)
 Chronic.  Elevated at visit today but controlled at home.  Patient did not take her medication prior to coming to visit.  Continue with current medication regimen of Amlodipine  10 mg daily and Olmesartan  40mg  daily.  Follow up in 3 months.  Continue to check blood pressures at home. Call sooner if concerns arise.

## 2024-04-15 NOTE — Progress Notes (Signed)
 BP (!) 156/69 (BP Location: Left Arm, Cuff Size: Normal)   Pulse (!) 103   Temp 98.6 F (37 C) (Oral)   Ht 5' 3 (1.6 m)   Wt 213 lb 3.2 oz (96.7 kg)   SpO2 99%   BMI 37.77 kg/m    Subjective:    Patient ID: Angela Russell, female    DOB: 1951/04/03, 73 y.o.   MRN: [id number]  HPI: Angela Russell is a 73 y.o. female  Chief Complaint  Patient presents with   Anemia   Diabetes    No recent eye exam per patient   Hyperlipidemia   Hypertension   HYPERTENSION Hypertension status: controlled  Satisfied with current treatment? yes Duration of hypertension: years BP monitoring frequency:  daily BP range: 136/70 BP medication side effects:  no Medication compliance: excellent compliance Previous BP meds:amlodipine  and Olmesartan  Aspirin : no Recurrent headaches: no Visual changes: no Palpitations: no Dyspnea: no Chest pain: no Lower extremity edema: no Dizzy/lightheaded: no    DIABETES Last A1c was 6.9%.  Needs new testing supplies.  No currently taking any medication.  Hypoglycemic episodes:no Polydipsia/polyuria: no Visual disturbance: no Chest pain: no Paresthesias: no Glucose Monitoring: no             Accucheck frequency:              Fasting glucose:              Post prandial:             Evening:             Before meals: Taking Insulin ?: no             Long acting insulin :             Short acting insulin : Blood Pressure Monitoring: rarely Retinal Examination: Due with Dr. Laurice New Exam: Up to Date Diabetic Education: Not Completed Pneumovax: Up to Date Influenza: Not up to date Aspirin : yes   CHRONIC KIDNEY DISEASE Needs a new appt with Dr. Jimmie.   CKD status: controlled Medications renally dose: yes Previous renal evaluation: yes Pneumovax:  Up to Date Influenza Vaccine:  Up to Date   ANEMIA Anemia status: stable Etiology of anemia:CKD Duration of anemia treatment:  Compliance with treatment: excellent compliance Iron   supplementation side effects: no Severity of anemia: moderate Fatigue: yes Decreased exercise tolerance: no  Dyspnea on exertion: no Palpitations: no Bleeding: no Pica: no   Relevant past medical, surgical, family and social history reviewed and updated as indicated. Interim medical history since our last visit reviewed. Allergies and medications reviewed and updated.  Review of Systems  Constitutional:  Negative for fatigue.  Eyes:  Negative for visual disturbance.  Respiratory:  Negative for cough, chest tightness and shortness of breath.   Cardiovascular:  Negative for chest pain, palpitations and leg swelling.  Endocrine: Negative for polydipsia and polyuria.  Neurological:  Negative for dizziness, numbness and headaches.    Per HPI unless specifically indicated above     Objective:    BP (!) 156/69 (BP Location: Left Arm, Cuff Size: Normal)   Pulse (!) 103   Temp 98.6 F (37 C) (Oral)   Ht 5' 3 (1.6 m)   Wt 213 lb 3.2 oz (96.7 kg)   SpO2 99%   BMI 37.77 kg/m   Wt Readings from Last 3 Encounters:  04/15/24 213 lb 3.2 oz (96.7 kg)  04/14/24 210 lb (95.3 kg)  12/24/23 207 lb (93.9  kg)    Physical Exam Vitals and nursing note reviewed.  Constitutional:      General: She is not in acute distress.    Appearance: Normal appearance. She is normal weight. She is not ill-appearing, toxic-appearing or diaphoretic.  HENT:     Head: Normocephalic.     Right Ear: External ear normal.     Left Ear: External ear normal.     Nose: Nose normal.     Mouth/Throat:     Mouth: Mucous membranes are moist.     Pharynx: Oropharynx is clear.  Eyes:     General:        Right eye: No discharge.        Left eye: No discharge.     Extraocular Movements: Extraocular movements intact.     Conjunctiva/sclera: Conjunctivae normal.     Pupils: Pupils are equal, round, and reactive to light.  Cardiovascular:     Rate and Rhythm: Normal rate and regular rhythm.     Heart sounds: No  murmur heard. Pulmonary:     Effort: Pulmonary effort is normal. No respiratory distress.     Breath sounds: Normal breath sounds. No wheezing or rales.  Musculoskeletal:     Cervical back: Normal range of motion and neck supple.  Skin:    General: Skin is warm and dry.     Capillary Refill: Capillary refill takes less than 2 seconds.  Neurological:     General: No focal deficit present.     Mental Status: She is alert and oriented to person, place, and time. Mental status is at baseline.  Psychiatric:        Mood and Affect: Mood normal.        Behavior: Behavior normal.        Thought Content: Thought content normal.        Judgment: Judgment normal.     Results for orders placed or performed in visit on 12/24/23  CMP (Cancer Center only)   Collection Time: 12/24/23  2:37 PM  Result Value Ref Range   Sodium 140 135 - 145 mmol/L   Potassium 4.4 3.5 - 5.1 mmol/L   Chloride 111 98 - 111 mmol/L   CO2 21 (L) 22 - 32 mmol/L   Glucose, Bld 89 70 - 99 mg/dL   BUN 30 (H) 8 - 23 mg/dL   Creatinine 8.03 (H) 9.55 - 1.00 mg/dL   Calcium  9.1 8.9 - 10.3 mg/dL   Total Protein 7.2 6.5 - 8.1 g/dL   Albumin 3.9 3.5 - 5.0 g/dL   AST 19 15 - 41 U/L   ALT 12 0 - 44 U/L   Alkaline Phosphatase 101 38 - 126 U/L   Total Bilirubin 0.5 0.0 - 1.2 mg/dL   GFR, Estimated 26 (L) >60 mL/min   Anion gap 8 5 - 15  Ferritin   Collection Time: 12/24/23  2:37 PM  Result Value Ref Range   Ferritin 136 11 - 307 ng/mL  Iron  and TIBC ([facility] ONLY)   Collection Time: 12/24/23  2:37 PM  Result Value Ref Range   Iron  69 28 - 170 ug/dL   TIBC 670 749 - 549 ug/dL   Saturation Ratios 21 10.4 - 31.8 %   UIBC 260 ug/dL  Retic Panel   Collection Time: 12/24/23  2:37 PM  Result Value Ref Range   Retic Ct Pct 1.4 0.4 - 3.1 %   RBC. 3.86 (L) 3.87 - 5.11 MIL/uL   Retic  Count, Absolute 55.6 19.0 - 186.0 K/uL   Immature Retic Fract 9.6 2.3 - 15.9 %   Reticulocyte Hemoglobin 26.6 (L) >27.9 pg   CBC with Differential (Cancer Center Only)   Collection Time: 12/24/23  2:37 PM  Result Value Ref Range   WBC Count 4.8 4.0 - 10.5 K/uL   RBC 3.88 3.87 - 5.11 MIL/uL   Hemoglobin 9.3 (L) 12.0 - 15.0 g/dL   HCT 69.9 (L) 63.9 - 53.9 %   MCV 77.3 (L) 80.0 - 100.0 fL   MCH 24.0 (L) 26.0 - 34.0 pg   MCHC 31.0 30.0 - 36.0 g/dL   RDW 85.3 88.4 - 84.4 %   Platelet Count 262 150 - 400 K/uL   nRBC 0.0 0.0 - 0.2 %   Neutrophils Relative % 61 %   Neutro Abs 3.0 1.7 - 7.7 K/uL   Lymphocytes Relative 28 %   Lymphs Abs 1.4 0.7 - 4.0 K/uL   Monocytes Relative 8 %   Monocytes Absolute 0.4 0.1 - 1.0 K/uL   Eosinophils Relative 2 %   Eosinophils Absolute 0.1 0.0 - 0.5 K/uL   Basophils Relative 1 %   Basophils Absolute 0.0 0.0 - 0.1 K/uL   Immature Granulocytes 0 %   Abs Immature Granulocytes 0.01 0.00 - 0.07 K/uL      Assessment & Plan:   Problem List Items Addressed This Visit       Cardiovascular and Mediastinum   Hypertension   Chronic.  Elevated at visit today but controlled at home.  Patient did not take her medication prior to coming to visit.  Continue with current medication regimen of Amlodipine  10 mg daily and Olmesartan  40mg  daily.  Follow up in 3 months.  Continue to check blood pressures at home. Call sooner if concerns arise.       Relevant Medications   amLODipine  (NORVASC ) 10 MG tablet   olmesartan  (BENICAR ) 40 MG tablet   Other Relevant Orders   Comprehensive metabolic panel with GFR   Atherosclerosis of other arteries   Relevant Medications   amLODipine  (NORVASC ) 10 MG tablet   olmesartan  (BENICAR ) 40 MG tablet     Endocrine   Controlled diabetes mellitus type 2 with complications (HCC) - Primary   Chronic.  Controlled.  Last A1c 6.9%.  Not currently on any medication.   Patient needs updated eye exam.  Labs ordered today.  Follow up in 6 months. Call sooner if concerns arise.       Relevant Medications   olmesartan  (BENICAR ) 40 MG tablet   Other Relevant Orders    Hemoglobin A1c     Genitourinary   CKD (chronic kidney disease) (Chronic)   Chronic.  Controlled.  Continue with current medication regimen.  Has not seen Nephrology since 2022.  Encouraged patient to make an appt.  Labs ordered today.  Return to clinic in 6 months for reevaluation.  Call sooner if concerns arise.        Bladder carcinoma (HCC) (Chronic)   Followed by Dr. Babara. Reviewed recent note.      Anemia in stage 4 chronic kidney disease (HCC) (Chronic)   Chronic.  Has been stable. Baseline creatinine of 1.6.  Labs ordered today.  Followed by Nephrology- has not ben seen since 2022.  Also followed at the Cancer center by Dr. Babara.  Hemoglobin down to 9.9 at visit today.   Continue to follow up with specialists.      Relevant Orders   CBC With Diff/Platelet  Other   Hyperlipidemia   Relevant Medications   amLODipine  (NORVASC ) 10 MG tablet   olmesartan  (BENICAR ) 40 MG tablet   Other Relevant Orders   Lipid panel   Obesity, morbid (HCC)   Recommended eating smaller high protein, low fat meals more frequently and exercising 30 mins a day 5 times a week with a goal of 10-15lb weight loss in the next 3 months.      Other Visit Diagnoses       Screening for colon cancer       Relevant Orders   Ambulatory referral to Gastroenterology        Follow up plan: Return in about 6 months (around 10/13/2024) for Physical and Fasting labs.

## 2024-04-15 NOTE — Assessment & Plan Note (Signed)
 Chronic.  Controlled.  Continue with current medication regimen.  Has not seen Nephrology since 2022.  Encouraged patient to make an appt.  Labs ordered today.  Return to clinic in 6 months for reevaluation.  Call sooner if concerns arise.

## 2024-04-15 NOTE — Assessment & Plan Note (Signed)
 Recommended eating smaller high protein, low fat meals more frequently and exercising 30 mins a day 5 times a week with a goal of 10-15lb weight loss in the next 3 months.

## 2024-04-15 NOTE — Assessment & Plan Note (Signed)
 Followed by Dr. Babara. Reviewed recent note.

## 2024-04-16 ENCOUNTER — Ambulatory Visit: Payer: Self-pay | Admitting: Nurse Practitioner

## 2024-04-16 LAB — CBC WITH DIFF/PLATELET
Basophils Absolute: 0 x10E3/uL (ref 0.0–0.2)
Basos: 0 %
EOS (ABSOLUTE): 0.1 x10E3/uL (ref 0.0–0.4)
Eos: 2 %
Hematocrit: 31.5 % — ABNORMAL LOW (ref 34.0–46.6)
Hemoglobin: 9.6 g/dL — ABNORMAL LOW (ref 11.1–15.9)
Immature Grans (Abs): 0 x10E3/uL (ref 0.0–0.1)
Immature Granulocytes: 0 %
Lymphocytes Absolute: 1.3 x10E3/uL (ref 0.7–3.1)
Lymphs: 25 %
MCH: 24.2 pg — ABNORMAL LOW (ref 26.6–33.0)
MCHC: 30.5 g/dL — ABNORMAL LOW (ref 31.5–35.7)
MCV: 80 fL (ref 79–97)
Monocytes Absolute: 0.4 x10E3/uL (ref 0.1–0.9)
Monocytes: 8 %
Neutrophils Absolute: 3.3 x10E3/uL (ref 1.4–7.0)
Neutrophils: 64 %
Platelets: 274 x10E3/uL (ref 150–450)
RBC: 3.96 x10E6/uL (ref 3.77–5.28)
RDW: 13.6 % (ref 11.7–15.4)
WBC: 5.1 x10E3/uL (ref 3.4–10.8)

## 2024-04-16 LAB — LIPID PANEL
Chol/HDL Ratio: 3.2 ratio (ref 0.0–4.4)
Cholesterol, Total: 187 mg/dL (ref 100–199)
HDL: 58 mg/dL (ref >39)
LDL Chol Calc (NIH): 100 mg/dL — ABNORMAL HIGH (ref 0–99)
Triglycerides: 170 mg/dL — ABNORMAL HIGH (ref 0–149)
VLDL Cholesterol Cal: 29 mg/dL (ref 5–40)

## 2024-04-16 LAB — COMPREHENSIVE METABOLIC PANEL WITH GFR
ALT: 12 IU/L (ref 0–32)
AST: 16 IU/L (ref 0–40)
Albumin: 4.1 g/dL (ref 3.8–4.8)
Alkaline Phosphatase: 117 IU/L (ref 49–135)
BUN/Creatinine Ratio: 21 (ref 12–28)
BUN: 39 mg/dL — ABNORMAL HIGH (ref 8–27)
Bilirubin Total: 0.2 mg/dL (ref 0.0–1.2)
CO2: 19 mmol/L — ABNORMAL LOW (ref 20–29)
Calcium: 9.4 mg/dL (ref 8.7–10.3)
Chloride: 106 mmol/L (ref 96–106)
Creatinine, Ser: 1.83 mg/dL — ABNORMAL HIGH (ref 0.57–1.00)
Globulin, Total: 2.5 g/dL (ref 1.5–4.5)
Glucose: 169 mg/dL — ABNORMAL HIGH (ref 70–99)
Potassium: 4.6 mmol/L (ref 3.5–5.2)
Sodium: 142 mmol/L (ref 134–144)
Total Protein: 6.6 g/dL (ref 6.0–8.5)
eGFR: 29 mL/min/1.73 — ABNORMAL LOW (ref >59)

## 2024-04-16 LAB — HEMOGLOBIN A1C
Est. average glucose Bld gHb Est-mCnc: 169 mg/dL
Hgb A1c MFr Bld: 7.5 % — ABNORMAL HIGH (ref 4.8–5.6)

## 2024-04-17 MED ORDER — ROSUVASTATIN CALCIUM 5 MG PO TABS: 5.0000 mg | ORAL_TABLET | Freq: Every day | ORAL | 1 refills | Status: AC

## 2024-04-21 MED ORDER — EMPAGLIFLOZIN 25 MG PO TABS: 25.0000 mg | ORAL_TABLET | Freq: Every day | ORAL | 1 refills | Status: AC

## 2024-04-22 ENCOUNTER — Telehealth: Payer: Self-pay

## 2024-04-22 NOTE — Telephone Encounter (Signed)
 Called patient to schedule her colonoscopy but was not able to since she wanted to have it done at [facility] and we didn't have anything available. I let her know that we would have a new provider coming in January but do not have the schedule open as of yet. Patient agreed to contact her next year once we have a schedule open.

## 2024-04-24 ENCOUNTER — Ambulatory Visit
Admission: RE | Admit: 2024-04-24 | Discharge: 2024-04-24 | Disposition: A | Discharge status: Home / Self Care | Source: Ambulatory Visit | Attending: Oncology | Admitting: Oncology

## 2024-04-24 DIAGNOSIS — D0512 Intraductal carcinoma in situ of left breast: Secondary | ICD-10-CM | POA: Diagnosis present

## 2024-05-15 ENCOUNTER — Inpatient Hospital Stay: Attending: Oncology

## 2024-05-15 DIAGNOSIS — Z452 Encounter for adjustment and management of vascular access device: Secondary | ICD-10-CM | POA: Diagnosis present

## 2024-05-15 DIAGNOSIS — Z8551 Personal history of malignant neoplasm of bladder: Secondary | ICD-10-CM | POA: Insufficient documentation

## 2024-05-22 ENCOUNTER — Other Ambulatory Visit

## 2024-05-22 DIAGNOSIS — I10 Essential (primary) hypertension: Secondary | ICD-10-CM

## 2024-05-23 LAB — COMPREHENSIVE METABOLIC PANEL WITH GFR
ALT: 11 IU/L (ref 0–32)
AST: 15 IU/L (ref 0–40)
Albumin: 4.3 g/dL (ref 3.8–4.8)
Alkaline Phosphatase: 117 IU/L (ref 49–135)
BUN/Creatinine Ratio: 19 (ref 12–28)
BUN: 36 mg/dL — ABNORMAL HIGH (ref 8–27)
Bilirubin Total: 0.3 mg/dL (ref 0.0–1.2)
CO2: 22 mmol/L (ref 20–29)
Calcium: 9.6 mg/dL (ref 8.7–10.3)
Chloride: 105 mmol/L (ref 96–106)
Creatinine, Ser: 1.86 mg/dL — ABNORMAL HIGH (ref 0.57–1.00)
Globulin, Total: 2.5 g/dL (ref 1.5–4.5)
Glucose: 203 mg/dL — ABNORMAL HIGH (ref 70–99)
Potassium: 4.9 mmol/L (ref 3.5–5.2)
Sodium: 141 mmol/L (ref 134–144)
Total Protein: 6.8 g/dL (ref 6.0–8.5)
eGFR: 28 mL/min/1.73 — ABNORMAL LOW

## 2024-05-25 ENCOUNTER — Ambulatory Visit: Payer: Self-pay | Admitting: Nurse Practitioner

## 2024-06-30 ENCOUNTER — Inpatient Hospital Stay

## 2024-06-30 ENCOUNTER — Inpatient Hospital Stay: Admitting: Oncology

## 2024-08-07 ENCOUNTER — Inpatient Hospital Stay

## 2024-08-07 ENCOUNTER — Inpatient Hospital Stay: Admitting: Oncology

## 2024-11-25 ENCOUNTER — Ambulatory Visit: Admitting: Radiation Oncology

## 2025-04-19 ENCOUNTER — Encounter: Admitting: Nurse Practitioner

## 2025-04-20 ENCOUNTER — Ambulatory Visit

## 2025-04-23 ENCOUNTER — Encounter: Admitting: Nurse Practitioner
